# Patient Record
Sex: Male | Born: 1940
Health system: Southern US, Community
[De-identification: ages and names within clinical notes are randomized; demographics above are authoritative.]

## PROBLEM LIST (undated history)

## (undated) DIAGNOSIS — J302 Other seasonal allergic rhinitis: Secondary | ICD-10-CM

## (undated) DIAGNOSIS — J383 Other diseases of vocal cords: Secondary | ICD-10-CM

## (undated) DIAGNOSIS — F419 Anxiety disorder, unspecified: Secondary | ICD-10-CM

## (undated) DIAGNOSIS — Z860101 Personal history of adenomatous and serrated colon polyps: Secondary | ICD-10-CM

## (undated) DIAGNOSIS — R350 Frequency of micturition: Secondary | ICD-10-CM

## (undated) DIAGNOSIS — Z86007 Personal history of in-situ neoplasm of skin: Secondary | ICD-10-CM

## (undated) DIAGNOSIS — J3089 Other allergic rhinitis: Secondary | ICD-10-CM

## (undated) DIAGNOSIS — T8859XA Other complications of anesthesia, initial encounter: Secondary | ICD-10-CM

## (undated) DIAGNOSIS — R202 Paresthesia of skin: Secondary | ICD-10-CM

## (undated) DIAGNOSIS — N400 Enlarged prostate without lower urinary tract symptoms: Secondary | ICD-10-CM

## (undated) DIAGNOSIS — K219 Gastro-esophageal reflux disease without esophagitis: Secondary | ICD-10-CM

## (undated) DIAGNOSIS — K5730 Diverticulosis of large intestine without perforation or abscess without bleeding: Secondary | ICD-10-CM

## (undated) DIAGNOSIS — N210 Calculus in bladder: Secondary | ICD-10-CM

## (undated) DIAGNOSIS — Z9109 Other allergy status, other than to drugs and biological substances: Secondary | ICD-10-CM

## (undated) DIAGNOSIS — M1990 Unspecified osteoarthritis, unspecified site: Secondary | ICD-10-CM

## (undated) DIAGNOSIS — Z87442 Personal history of urinary calculi: Secondary | ICD-10-CM

## (undated) DIAGNOSIS — D696 Thrombocytopenia, unspecified: Secondary | ICD-10-CM

## (undated) DIAGNOSIS — H04123 Dry eye syndrome of bilateral lacrimal glands: Secondary | ICD-10-CM

## (undated) DIAGNOSIS — Z86010 Personal history of colonic polyps: Secondary | ICD-10-CM

## (undated) DIAGNOSIS — G43909 Migraine, unspecified, not intractable, without status migrainosus: Secondary | ICD-10-CM

## (undated) DIAGNOSIS — Z973 Presence of spectacles and contact lenses: Secondary | ICD-10-CM

## (undated) DIAGNOSIS — K5909 Other constipation: Secondary | ICD-10-CM

## (undated) DIAGNOSIS — T4145XA Adverse effect of unspecified anesthetic, initial encounter: Secondary | ICD-10-CM

## (undated) HISTORY — PX: APPENDECTOMY: SHX54

## (undated) HISTORY — PX: EXTRACORPOREAL SHOCK WAVE LITHOTRIPSY: SHX1557

## (undated) HISTORY — PX: NISSEN FUNDOPLICATION: SHX2091

## (undated) HISTORY — PX: CARDIOVASCULAR STRESS TEST: SHX262

## (undated) HISTORY — PX: INGUINAL HERNIA REPAIR: SUR1180

## (undated) HISTORY — PX: COLONOSCOPY: SHX174

## (undated) HISTORY — DX: Gastro-esophageal reflux disease without esophagitis: K21.9

## (undated) HISTORY — PX: TONSILLECTOMY: SUR1361

---

## 1995-03-20 ENCOUNTER — Encounter: Payer: Self-pay | Admitting: Internal Medicine

## 1999-03-24 ENCOUNTER — Encounter: Payer: Self-pay | Admitting: Family Medicine

## 1999-03-24 ENCOUNTER — Emergency Department (HOSPITAL_COMMUNITY): Admission: EM | Admit: 1999-03-24 | Discharge: 1999-03-24 | Payer: Self-pay | Admitting: Emergency Medicine

## 1999-03-24 ENCOUNTER — Encounter: Payer: Self-pay | Admitting: Urology

## 1999-03-28 ENCOUNTER — Ambulatory Visit (HOSPITAL_COMMUNITY): Admission: RE | Admit: 1999-03-28 | Discharge: 1999-03-28 | Payer: Self-pay | Admitting: Urology

## 1999-03-28 ENCOUNTER — Encounter: Payer: Self-pay | Admitting: Urology

## 2001-06-07 ENCOUNTER — Encounter: Payer: Self-pay | Admitting: Urology

## 2001-06-07 ENCOUNTER — Encounter: Admission: RE | Admit: 2001-06-07 | Discharge: 2001-06-07 | Payer: Self-pay | Admitting: Urology

## 2002-11-11 ENCOUNTER — Encounter (INDEPENDENT_AMBULATORY_CARE_PROVIDER_SITE_OTHER): Payer: Self-pay | Admitting: *Deleted

## 2002-11-11 ENCOUNTER — Ambulatory Visit (HOSPITAL_COMMUNITY): Admission: RE | Admit: 2002-11-11 | Discharge: 2002-11-11 | Payer: Self-pay | Admitting: Gastroenterology

## 2003-09-19 ENCOUNTER — Encounter: Admission: RE | Admit: 2003-09-19 | Discharge: 2003-09-19 | Payer: Self-pay | Admitting: Allergy and Immunology

## 2003-11-14 ENCOUNTER — Encounter: Payer: Self-pay | Admitting: Pulmonary Disease

## 2004-12-20 ENCOUNTER — Ambulatory Visit: Payer: Self-pay | Admitting: Pulmonary Disease

## 2005-01-17 ENCOUNTER — Ambulatory Visit: Payer: Self-pay | Admitting: Pulmonary Disease

## 2005-02-28 ENCOUNTER — Ambulatory Visit: Payer: Self-pay | Admitting: Pulmonary Disease

## 2005-05-13 ENCOUNTER — Ambulatory Visit: Payer: Self-pay | Admitting: Pulmonary Disease

## 2005-08-06 ENCOUNTER — Encounter: Admission: RE | Admit: 2005-08-06 | Discharge: 2005-08-06 | Payer: Self-pay | Admitting: Gastroenterology

## 2005-08-11 ENCOUNTER — Encounter: Admission: RE | Admit: 2005-08-11 | Discharge: 2005-08-11 | Payer: Self-pay | Admitting: Gastroenterology

## 2006-01-30 ENCOUNTER — Ambulatory Visit: Payer: Self-pay | Admitting: Pulmonary Disease

## 2006-03-13 ENCOUNTER — Ambulatory Visit: Payer: Self-pay | Admitting: Pulmonary Disease

## 2006-10-20 ENCOUNTER — Ambulatory Visit (HOSPITAL_COMMUNITY): Admission: RE | Admit: 2006-10-20 | Discharge: 2006-10-20 | Payer: Self-pay | Admitting: Orthopedic Surgery

## 2007-01-28 DIAGNOSIS — T7840XA Allergy, unspecified, initial encounter: Secondary | ICD-10-CM | POA: Insufficient documentation

## 2007-01-28 DIAGNOSIS — R05 Cough: Secondary | ICD-10-CM

## 2007-01-28 DIAGNOSIS — R059 Cough, unspecified: Secondary | ICD-10-CM | POA: Insufficient documentation

## 2007-01-28 DIAGNOSIS — R002 Palpitations: Secondary | ICD-10-CM | POA: Insufficient documentation

## 2007-01-28 DIAGNOSIS — M810 Age-related osteoporosis without current pathological fracture: Secondary | ICD-10-CM | POA: Insufficient documentation

## 2007-08-12 ENCOUNTER — Encounter: Payer: Self-pay | Admitting: Pulmonary Disease

## 2007-09-21 ENCOUNTER — Ambulatory Visit: Payer: Self-pay | Admitting: Pulmonary Disease

## 2007-09-21 ENCOUNTER — Telehealth (INDEPENDENT_AMBULATORY_CARE_PROVIDER_SITE_OTHER): Payer: Self-pay | Admitting: *Deleted

## 2007-09-21 DIAGNOSIS — K219 Gastro-esophageal reflux disease without esophagitis: Secondary | ICD-10-CM | POA: Insufficient documentation

## 2007-09-21 DIAGNOSIS — J309 Allergic rhinitis, unspecified: Secondary | ICD-10-CM | POA: Insufficient documentation

## 2007-09-28 ENCOUNTER — Ambulatory Visit: Payer: Self-pay | Admitting: Pulmonary Disease

## 2007-12-25 ENCOUNTER — Encounter: Admission: RE | Admit: 2007-12-25 | Discharge: 2007-12-25 | Payer: Self-pay | Admitting: Orthopedic Surgery

## 2008-04-04 ENCOUNTER — Ambulatory Visit: Payer: Self-pay | Admitting: Pulmonary Disease

## 2008-04-17 ENCOUNTER — Ambulatory Visit: Payer: Self-pay | Admitting: Pulmonary Disease

## 2008-06-29 ENCOUNTER — Emergency Department (HOSPITAL_COMMUNITY): Admission: EM | Admit: 2008-06-29 | Discharge: 2008-06-30 | Payer: Self-pay | Admitting: Emergency Medicine

## 2008-10-05 ENCOUNTER — Encounter: Admission: RE | Admit: 2008-10-05 | Discharge: 2008-10-05 | Payer: Self-pay | Admitting: Gastroenterology

## 2008-11-03 ENCOUNTER — Encounter: Admission: RE | Admit: 2008-11-03 | Discharge: 2008-11-03 | Payer: Self-pay | Admitting: Gastroenterology

## 2008-11-21 ENCOUNTER — Ambulatory Visit: Payer: Self-pay | Admitting: Internal Medicine

## 2008-11-21 ENCOUNTER — Telehealth (INDEPENDENT_AMBULATORY_CARE_PROVIDER_SITE_OTHER): Payer: Self-pay | Admitting: *Deleted

## 2009-01-18 ENCOUNTER — Ambulatory Visit (HOSPITAL_COMMUNITY): Admission: RE | Admit: 2009-01-18 | Discharge: 2009-01-18 | Payer: Self-pay | Admitting: Gastroenterology

## 2009-08-23 ENCOUNTER — Ambulatory Visit: Payer: Self-pay | Admitting: Pulmonary Disease

## 2010-03-28 ENCOUNTER — Emergency Department (HOSPITAL_COMMUNITY)
Admission: EM | Admit: 2010-03-28 | Discharge: 2010-03-28 | Payer: Self-pay | Source: Home / Self Care | Admitting: Emergency Medicine

## 2010-04-21 ENCOUNTER — Encounter: Payer: Self-pay | Admitting: Gastroenterology
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
## 2010-05-02 NOTE — Consult Note (Signed)
Summary: [facility] @ [facility] @ [place]   Imported By: [name] Links D'jimraou 09/16/2007 15:08:00  _____________________________________________________________________  External Attachment:    Type:   Image     Comment:   External Document
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
## 2010-05-02 NOTE — Progress Notes (Signed)
Summary: rx request/ sob   Phone Note Call from Patient   Caller: Patient Call For: alva Summary of Call: pt c/o sob. says he'd like rx called in and he'll make an appt w/ ra later. i tried to get him in to be seen but pt needs a late (as late as 4:30 appt). [facility] pt # [phone] Initial call taken by: Tivis Ringer,  November 21, 2008 11:10 AM  Follow-up for Phone Call        Spoke with pt.  He c/o increased sob and chest tightness x 2 days.  OV with TP today at 4:30 pm. Follow-up by: Vernie Murders,  November 21, 2008 11:22 AM

## 2010-05-02 NOTE — Procedures (Signed)
Summary: EGD w/biopsy  EGD w/biopsy   Imported By: Sherian Rein 01/10/2009 10:09:35  _____________________________________________________________________  External Attachment:    Type:   Image     Comment:   External Document

## 2010-05-07 ENCOUNTER — Other Ambulatory Visit: Payer: Self-pay | Admitting: Gastroenterology

## 2010-05-23 ENCOUNTER — Ambulatory Visit
Admission: RE | Admit: 2010-05-23 | Discharge: 2010-05-23 | Disposition: A | Discharge status: Home / Self Care | Payer: BC Managed Care – PPO | Source: Ambulatory Visit | Attending: Gastroenterology | Admitting: Gastroenterology

## 2010-07-10 LAB — BASIC METABOLIC PANEL
BUN: 18 mg/dL (ref 6–23)
CO2: 27 mEq/L (ref 19–32)
Calcium: 9.2 mg/dL (ref 8.4–10.5)
Chloride: 106 mEq/L (ref 96–112)
Creatinine, Ser: 0.76 mg/dL (ref 0.4–1.5)
GFR calc Af Amer: >60 mL/min (ref >60)
GFR calc non Af Amer: >60 mL/min (ref >60)
Glucose, Bld: 110 mg/dL — ABNORMAL HIGH (ref 70–99)
Potassium: 4.1 mEq/L (ref 3.5–5.1)
Sodium: 138 mEq/L (ref 135–145)

## 2010-07-10 LAB — DIFFERENTIAL
Basophils Absolute: 0 10*3/uL (ref 0.0–0.1)
Basophils Relative: 1 % (ref 0–1)
Eosinophils Absolute: 0.4 10*3/uL (ref 0.0–0.7)
Eosinophils Relative: 7 % — ABNORMAL HIGH (ref 0–5)
Lymphocytes Relative: 34 % (ref 12–46)
Lymphs Abs: 1.9 10*3/uL (ref 0.7–4.0)
Monocytes Absolute: 0.5 10*3/uL (ref 0.1–1.0)
Monocytes Relative: 9 % (ref 3–12)
Neutro Abs: 2.8 10*3/uL (ref 1.7–7.7)
Neutrophils Relative %: 50 % (ref 43–77)

## 2010-07-10 LAB — POCT CARDIAC MARKERS
CKMB, poc: <1 ng/mL — ABNORMAL LOW (ref 1.0–8.0)
Myoglobin, poc: 36 ng/mL (ref 12–200)
Troponin i, poc: <0.05 ng/mL (ref 0.00–0.09)

## 2010-07-10 LAB — CBC
HCT: 40.5 % (ref 39.0–52.0)
Hemoglobin: 14.2 g/dL (ref 13.0–17.0)
MCHC: 35 g/dL (ref 30.0–36.0)
MCV: 95.5 fL (ref 78.0–100.0)
Platelets: 123 10*3/uL — ABNORMAL LOW (ref 150–400)
RBC: 4.24 MIL/uL (ref 4.22–5.81)
RDW: 12.6 % (ref 11.5–15.5)
WBC: 5.6 10*3/uL (ref 4.0–10.5)

## 2010-07-17 ENCOUNTER — Other Ambulatory Visit: Payer: Self-pay | Admitting: Orthopedic Surgery

## 2010-07-17 DIAGNOSIS — R531 Weakness: Secondary | ICD-10-CM

## 2010-07-17 DIAGNOSIS — R52 Pain, unspecified: Secondary | ICD-10-CM

## 2010-07-20 ENCOUNTER — Ambulatory Visit (HOSPITAL_COMMUNITY)
Admission: RE | Admit: 2010-07-20 | Discharge: 2010-07-20 | Disposition: A | Discharge status: Home / Self Care | Payer: BC Managed Care – PPO | Source: Ambulatory Visit | Attending: Orthopedic Surgery | Admitting: Orthopedic Surgery

## 2010-07-20 DIAGNOSIS — M5020 Other cervical disc displacement, unspecified cervical region: Secondary | ICD-10-CM | POA: Insufficient documentation

## 2010-07-20 DIAGNOSIS — R5381 Other malaise: Secondary | ICD-10-CM | POA: Insufficient documentation

## 2010-07-20 DIAGNOSIS — R531 Weakness: Secondary | ICD-10-CM

## 2010-07-20 DIAGNOSIS — M4120 Other idiopathic scoliosis, site unspecified: Secondary | ICD-10-CM | POA: Insufficient documentation

## 2010-07-20 DIAGNOSIS — R52 Pain, unspecified: Secondary | ICD-10-CM

## 2010-07-20 DIAGNOSIS — R209 Unspecified disturbances of skin sensation: Secondary | ICD-10-CM | POA: Insufficient documentation

## 2010-09-05 ENCOUNTER — Ambulatory Visit (HOSPITAL_COMMUNITY)
Admission: RE | Admit: 2010-09-05 | Discharge: 2010-09-05 | Disposition: A | Discharge status: Home / Self Care | Payer: BC Managed Care – PPO | Source: Ambulatory Visit | Attending: Gastroenterology | Admitting: Gastroenterology

## 2010-09-05 ENCOUNTER — Other Ambulatory Visit: Payer: Self-pay | Admitting: Gastroenterology

## 2010-09-05 DIAGNOSIS — M810 Age-related osteoporosis without current pathological fracture: Secondary | ICD-10-CM | POA: Insufficient documentation

## 2010-09-05 DIAGNOSIS — K2980 Duodenitis without bleeding: Secondary | ICD-10-CM | POA: Insufficient documentation

## 2010-09-05 DIAGNOSIS — Z1211 Encounter for screening for malignant neoplasm of colon: Secondary | ICD-10-CM | POA: Insufficient documentation

## 2010-09-05 DIAGNOSIS — R1310 Dysphagia, unspecified: Secondary | ICD-10-CM | POA: Insufficient documentation

## 2010-09-05 DIAGNOSIS — H5040 Unspecified heterotropia: Secondary | ICD-10-CM | POA: Insufficient documentation

## 2010-09-05 DIAGNOSIS — Z860100 Personal history of colon polyps, unspecified: Secondary | ICD-10-CM | POA: Insufficient documentation

## 2010-09-05 DIAGNOSIS — K219 Gastro-esophageal reflux disease without esophagitis: Secondary | ICD-10-CM | POA: Insufficient documentation

## 2010-09-05 DIAGNOSIS — I059 Rheumatic mitral valve disease, unspecified: Secondary | ICD-10-CM | POA: Insufficient documentation

## 2010-09-13 ENCOUNTER — Encounter: Payer: Self-pay | Admitting: Adult Health

## 2010-09-16 NOTE — Op Note (Signed)
  NAME:  Lee, Nelson NO.:  [id number]  MEDICAL RECORD NO.:  [id number]  LOCATION:  [facility]                         FACILITY:  [facility]  PHYSICIAN:  Danise Edge, M.D.   DATE OF BIRTH:  25-Feb-1941  DATE OF PROCEDURE:  09/05/2010 DATE OF DISCHARGE:                              OPERATIVE REPORT   PROCEDURES:  Esophagogastroduodenoscopy, small-bowel biopsy and colonoscopy.  HISTORY:  [name] is a 70 year old male born 06-10-1940.  [name] is scheduled to undergo a surveillance colonoscopy with polypectomy to prevent colon cancer.  In 2004, the patient underwent a colonoscopy with removal of a small adenomatous polyp.  In 2007, the patient's surveillance colonoscopy was normal.  The patient has undergone a Nissen fundoplication to control gastroesophageal reflux.  In 2005, the patient's esophagogastroduodenoscopy was normal post Nissen fundoplication.  In 2010, the patient's esophagogastroduodenoscopy was normal post Nissen fundoplication.  In February 2012, the patient's barium esophagram with barium tablet was normal.  ENDOSCOPIST:  Danise Edge, M.D.  PREMEDICATIONS:  Fentanyl 100 mcg, Versed 9.5 mg.  PROCEDURE:  Esophagogastroduodenoscopy:  The Pentax gastroscope was passed through the posterior hypopharynx into the proximal esophagus without difficulty.  The hypopharynx, larynx and vocal cords appeared normal.  Esophagoscopy:  The proximal mid and lower segments of the esophageal mucosa appeared normal.  The squamocolumnar junction appears regular at 40 cm from the incisor teeth.  There is no endoscopic evidence for the presence of erosive esophagitis, Barrett's esophagus or esophageal stricture formation.  Gastroscopy:  Retroflex view of the gastric cardia and fundus was normal post Nissen fundoplication which appears to be intact.  The gastric body, antrum and pylorus appeared normal.  Duodenoscopy:  The duodenal bulb and  descending duodenum appeared normal.  BIOPSIES:  Biopsies were taken from the small intestine to rule out celiac disease.  ASSESSMENT:  Normal esophagogastroduodenoscopy post Nissen fundoplication.  Small bowel biopsies to look for villous atrophy pending.  PROCEDURE:  Surveillance colonoscopy:  Anal inspection and digital rectal exam were normal.  The Pentax pediatric colonoscope was introduced into the rectum and advanced to the cecum.  A normal- appearing ileocecal valve and appendiceal orifice were identified. Colonic preparation for the exam today was good.  Advancement of the colonoscope was technically difficult due to significant colonic loop formation.  Rectum normal.  Retroflex view of the distal rectum normal.  Sigmoid colon and descending colon normal.  Splenic flexure normal.  Transverse colon normal.  Hepatic flexure normal.  Ascending colon normal.  Cecum and ileocecal valve normal.  ASSESSMENT:  Normal surveillance proctocolonoscopy to the cecum.  RECOMMENDATIONS:  Repeat surveillance colonoscopy in 5 years.          ______________________________ Danise Edge, M.D.     MJ/MEDQ  D:  09/05/2010  T:  09/05/2010  Job:  [id number]  cc:   Gloriajean Dell. Andrey Campanile, M.D. Fax: [phone]  Electronically Signed by Danise Edge M.D. on 09/16/2010 04:15:38 PM

## 2010-09-17 ENCOUNTER — Other Ambulatory Visit (HOSPITAL_COMMUNITY): Payer: Self-pay | Admitting: Orthopedic Surgery

## 2010-09-17 DIAGNOSIS — R531 Weakness: Secondary | ICD-10-CM

## 2010-09-18 ENCOUNTER — Ambulatory Visit: Payer: BC Managed Care – PPO | Admitting: Adult Health

## 2010-09-24 ENCOUNTER — Ambulatory Visit (HOSPITAL_COMMUNITY)
Admission: RE | Admit: 2010-09-24 | Discharge: 2010-09-24 | Disposition: A | Discharge status: Home / Self Care | Payer: BC Managed Care – PPO | Source: Ambulatory Visit | Attending: Orthopedic Surgery | Admitting: Orthopedic Surgery

## 2010-09-24 DIAGNOSIS — M25579 Pain in unspecified ankle and joints of unspecified foot: Secondary | ICD-10-CM | POA: Insufficient documentation

## 2010-09-24 DIAGNOSIS — R531 Weakness: Secondary | ICD-10-CM

## 2010-10-03 ENCOUNTER — Ambulatory Visit: Payer: BC Managed Care – PPO | Admitting: Pulmonary Disease

## 2010-11-01 ENCOUNTER — Other Ambulatory Visit (HOSPITAL_COMMUNITY): Payer: Self-pay | Admitting: Orthopedic Surgery

## 2010-11-01 DIAGNOSIS — R52 Pain, unspecified: Secondary | ICD-10-CM

## 2010-11-01 DIAGNOSIS — R531 Weakness: Secondary | ICD-10-CM

## 2010-11-02 ENCOUNTER — Ambulatory Visit (HOSPITAL_COMMUNITY)
Admission: RE | Admit: 2010-11-02 | Discharge: 2010-11-02 | Disposition: A | Discharge status: Home / Self Care | Payer: BC Managed Care – PPO | Source: Ambulatory Visit | Attending: Orthopedic Surgery | Admitting: Orthopedic Surgery

## 2010-11-02 DIAGNOSIS — M25559 Pain in unspecified hip: Secondary | ICD-10-CM | POA: Insufficient documentation

## 2010-11-02 DIAGNOSIS — R52 Pain, unspecified: Secondary | ICD-10-CM

## 2010-11-02 DIAGNOSIS — M545 Low back pain, unspecified: Secondary | ICD-10-CM | POA: Insufficient documentation

## 2010-11-02 DIAGNOSIS — R531 Weakness: Secondary | ICD-10-CM

## 2010-11-02 DIAGNOSIS — M47817 Spondylosis without myelopathy or radiculopathy, lumbosacral region: Secondary | ICD-10-CM | POA: Insufficient documentation

## 2010-11-02 DIAGNOSIS — M4120 Other idiopathic scoliosis, site unspecified: Secondary | ICD-10-CM | POA: Insufficient documentation

## 2010-11-02 DIAGNOSIS — IMO0001 Reserved for inherently not codable concepts without codable children: Secondary | ICD-10-CM | POA: Insufficient documentation

## 2010-11-02 DIAGNOSIS — Z9181 History of falling: Secondary | ICD-10-CM | POA: Insufficient documentation

## 2010-11-02 DIAGNOSIS — R5381 Other malaise: Secondary | ICD-10-CM | POA: Insufficient documentation

## 2010-11-02 DIAGNOSIS — M8560 Other cyst of bone, unspecified site: Secondary | ICD-10-CM | POA: Insufficient documentation

## 2010-11-15 ENCOUNTER — Other Ambulatory Visit (HOSPITAL_COMMUNITY): Payer: Self-pay | Admitting: Diagnostic Neuroimaging

## 2010-11-15 ENCOUNTER — Other Ambulatory Visit (HOSPITAL_COMMUNITY): Payer: Self-pay | Admitting: Radiology

## 2010-11-15 DIAGNOSIS — G459 Transient cerebral ischemic attack, unspecified: Secondary | ICD-10-CM

## 2010-11-18 ENCOUNTER — Ambulatory Visit (HOSPITAL_COMMUNITY): Payer: BC Managed Care – PPO | Attending: Diagnostic Neuroimaging | Admitting: Radiology

## 2010-11-18 ENCOUNTER — Other Ambulatory Visit (HOSPITAL_COMMUNITY): Payer: BC Managed Care – PPO | Admitting: Radiology

## 2010-11-18 DIAGNOSIS — M6281 Muscle weakness (generalized): Secondary | ICD-10-CM | POA: Insufficient documentation

## 2010-11-18 DIAGNOSIS — I079 Rheumatic tricuspid valve disease, unspecified: Secondary | ICD-10-CM | POA: Insufficient documentation

## 2010-11-18 DIAGNOSIS — I059 Rheumatic mitral valve disease, unspecified: Secondary | ICD-10-CM | POA: Insufficient documentation

## 2010-11-18 DIAGNOSIS — G459 Transient cerebral ischemic attack, unspecified: Secondary | ICD-10-CM

## 2010-11-18 HISTORY — PX: TRANSTHORACIC ECHOCARDIOGRAM: SHX275

## 2010-11-19 ENCOUNTER — Encounter (HOSPITAL_COMMUNITY): Payer: Self-pay | Admitting: Diagnostic Neuroimaging

## 2011-01-09 ENCOUNTER — Other Ambulatory Visit: Payer: Self-pay | Admitting: Podiatrist

## 2011-01-09 DIAGNOSIS — M79671 Pain in right foot: Secondary | ICD-10-CM

## 2011-01-09 DIAGNOSIS — M79672 Pain in left foot: Secondary | ICD-10-CM

## 2011-01-18 ENCOUNTER — Ambulatory Visit (HOSPITAL_COMMUNITY)
Admission: RE | Admit: 2011-01-18 | Discharge: 2011-01-18 | Disposition: A | Discharge status: Home / Self Care | Payer: BC Managed Care – PPO | Source: Ambulatory Visit | Attending: Podiatrist | Admitting: Podiatrist

## 2011-01-18 DIAGNOSIS — M79671 Pain in right foot: Secondary | ICD-10-CM

## 2011-01-18 DIAGNOSIS — M79672 Pain in left foot: Secondary | ICD-10-CM

## 2011-01-18 DIAGNOSIS — M79609 Pain in unspecified limb: Secondary | ICD-10-CM | POA: Insufficient documentation

## 2011-02-03 ENCOUNTER — Other Ambulatory Visit (HOSPITAL_COMMUNITY): Payer: Self-pay | Admitting: Orthopedic Surgery

## 2011-02-03 DIAGNOSIS — M25561 Pain in right knee: Secondary | ICD-10-CM

## 2011-02-03 DIAGNOSIS — IMO0002 Reserved for concepts with insufficient information to code with codable children: Secondary | ICD-10-CM

## 2011-02-06 ENCOUNTER — Other Ambulatory Visit (HOSPITAL_COMMUNITY): Payer: BC Managed Care – PPO

## 2011-02-08 ENCOUNTER — Ambulatory Visit (HOSPITAL_COMMUNITY)
Admission: RE | Admit: 2011-02-08 | Discharge: 2011-02-08 | Disposition: A | Discharge status: Home / Self Care | Payer: BC Managed Care – PPO | Source: Ambulatory Visit | Attending: Orthopedic Surgery | Admitting: Orthopedic Surgery

## 2011-02-08 DIAGNOSIS — M23319 Other meniscus derangements, anterior horn of medial meniscus, unspecified knee: Secondary | ICD-10-CM | POA: Insufficient documentation

## 2011-02-08 DIAGNOSIS — M25561 Pain in right knee: Secondary | ICD-10-CM

## 2011-02-08 DIAGNOSIS — IMO0002 Reserved for concepts with insufficient information to code with codable children: Secondary | ICD-10-CM

## 2011-02-08 DIAGNOSIS — M25569 Pain in unspecified knee: Secondary | ICD-10-CM | POA: Insufficient documentation

## 2011-02-08 DIAGNOSIS — M6740 Ganglion, unspecified site: Secondary | ICD-10-CM | POA: Insufficient documentation

## 2011-02-28 ENCOUNTER — Other Ambulatory Visit: Payer: Self-pay | Admitting: Dermatology

## 2011-12-09 ENCOUNTER — Other Ambulatory Visit: Payer: Self-pay | Admitting: Gastroenterology

## 2011-12-09 DIAGNOSIS — R1310 Dysphagia, unspecified: Secondary | ICD-10-CM

## 2011-12-24 ENCOUNTER — Ambulatory Visit
Admission: RE | Admit: 2011-12-24 | Discharge: 2011-12-24 | Disposition: A | Discharge status: Home / Self Care | Payer: BC Managed Care – PPO | Source: Ambulatory Visit | Attending: Gastroenterology | Admitting: Gastroenterology

## 2011-12-24 DIAGNOSIS — R1310 Dysphagia, unspecified: Secondary | ICD-10-CM

## 2012-03-08 ENCOUNTER — Other Ambulatory Visit: Payer: Self-pay | Admitting: Internal Medicine

## 2012-03-08 DIAGNOSIS — R1011 Right upper quadrant pain: Secondary | ICD-10-CM

## 2012-03-12 ENCOUNTER — Ambulatory Visit
Admission: RE | Admit: 2012-03-12 | Discharge: 2012-03-12 | Disposition: A | Discharge status: Home / Self Care | Payer: BC Managed Care – PPO | Source: Ambulatory Visit | Attending: Internal Medicine | Admitting: Internal Medicine

## 2012-03-12 DIAGNOSIS — R1011 Right upper quadrant pain: Secondary | ICD-10-CM

## 2012-07-03 ENCOUNTER — Encounter (HOSPITAL_COMMUNITY): Payer: Self-pay | Admitting: Emergency Medicine

## 2012-07-03 ENCOUNTER — Emergency Department (HOSPITAL_COMMUNITY)
Admission: EM | Admit: 2012-07-03 | Discharge: 2012-07-03 | Disposition: A | Discharge status: Home / Self Care | Payer: BC Managed Care – PPO | Attending: Emergency Medicine | Admitting: Emergency Medicine

## 2012-07-03 DIAGNOSIS — Z79899 Other long term (current) drug therapy: Secondary | ICD-10-CM | POA: Insufficient documentation

## 2012-07-03 DIAGNOSIS — S134XXA Sprain of ligaments of cervical spine, initial encounter: Secondary | ICD-10-CM

## 2012-07-03 DIAGNOSIS — S139XXA Sprain of joints and ligaments of unspecified parts of neck, initial encounter: Secondary | ICD-10-CM | POA: Insufficient documentation

## 2012-07-03 DIAGNOSIS — Z85828 Personal history of other malignant neoplasm of skin: Secondary | ICD-10-CM | POA: Insufficient documentation

## 2012-07-03 DIAGNOSIS — Y92410 Unspecified street and highway as the place of occurrence of the external cause: Secondary | ICD-10-CM | POA: Insufficient documentation

## 2012-07-03 DIAGNOSIS — Z8709 Personal history of other diseases of the respiratory system: Secondary | ICD-10-CM | POA: Insufficient documentation

## 2012-07-03 DIAGNOSIS — K219 Gastro-esophageal reflux disease without esophagitis: Secondary | ICD-10-CM | POA: Insufficient documentation

## 2012-07-03 DIAGNOSIS — R51 Headache: Secondary | ICD-10-CM

## 2012-07-03 DIAGNOSIS — Y9389 Activity, other specified: Secondary | ICD-10-CM | POA: Insufficient documentation
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
## 2012-10-26 ENCOUNTER — Telehealth: Payer: Self-pay | Admitting: Physical Medicine & Rehabilitation

## 2013-01-07 ENCOUNTER — Other Ambulatory Visit (HOSPITAL_COMMUNITY): Payer: Self-pay | Admitting: Orthopedic Surgery

## 2013-01-07 DIAGNOSIS — M549 Dorsalgia, unspecified: Secondary | ICD-10-CM

## 2013-01-13 ENCOUNTER — Ambulatory Visit (HOSPITAL_COMMUNITY): Payer: BC Managed Care – PPO

## 2013-01-13 ENCOUNTER — Ambulatory Visit (HOSPITAL_COMMUNITY)
Admission: RE | Admit: 2013-01-13 | Discharge: 2013-01-13 | Disposition: A | Discharge status: Home / Self Care | Payer: BC Managed Care – PPO | Source: Ambulatory Visit | Attending: Orthopedic Surgery | Admitting: Orthopedic Surgery

## 2013-01-13 DIAGNOSIS — M545 Low back pain, unspecified: Secondary | ICD-10-CM | POA: Insufficient documentation

## 2013-01-13 DIAGNOSIS — M549 Dorsalgia, unspecified: Secondary | ICD-10-CM

## 2013-01-13 DIAGNOSIS — IMO0002 Reserved for concepts with insufficient information to code with codable children: Secondary | ICD-10-CM | POA: Insufficient documentation

## 2013-01-13 DIAGNOSIS — M51379 Other intervertebral disc degeneration, lumbosacral region without mention of lumbar back pain or lower extremity pain: Secondary | ICD-10-CM | POA: Insufficient documentation

## 2013-01-13 DIAGNOSIS — M5137 Other intervertebral disc degeneration, lumbosacral region: Secondary | ICD-10-CM | POA: Insufficient documentation

## 2013-01-13 DIAGNOSIS — M546 Pain in thoracic spine: Secondary | ICD-10-CM | POA: Insufficient documentation

## 2013-09-07 ENCOUNTER — Encounter: Payer: Self-pay | Admitting: Diagnostic Neuroimaging

## 2013-09-07 ENCOUNTER — Ambulatory Visit (INDEPENDENT_AMBULATORY_CARE_PROVIDER_SITE_OTHER): Payer: BC Managed Care – PPO | Admitting: Diagnostic Neuroimaging

## 2013-09-07 ENCOUNTER — Encounter (INDEPENDENT_AMBULATORY_CARE_PROVIDER_SITE_OTHER): Payer: Self-pay

## 2013-09-07 VITALS — BP 90/55 | HR 69 | Temp 97.7°F | Ht 70.0 in | Wt 136.0 lb

## 2013-09-07 DIAGNOSIS — H532 Diplopia: Secondary | ICD-10-CM

## 2013-09-07 DIAGNOSIS — R51 Headache: Secondary | ICD-10-CM

## 2013-09-07 DIAGNOSIS — M542 Cervicalgia: Secondary | ICD-10-CM

## 2013-09-07 NOTE — Patient Instructions (Signed)
Start aspirin 81 mg daily

## 2013-09-07 NOTE — Progress Notes (Signed)
[name]  PATIENT: Lee Nelson DOB: Aug 31, 1940  REFERRING CLINICIAN: Avva HISTORY FROM: patient  REASON FOR VISIT: new consult   HISTORICAL  CHIEF COMPLAINT:  Chief Complaint  Patient presents with  . Diplopia    HA    HISTORY OF PRESENT ILLNESS:   73 year old right-handed male with history of neck pain, back pain, skin cancer, reflux disease, osteoporosis, anxiety, fibromyalgia, migraine, here for evaluation of headaches and transient double vision. Previous evaluated patient in 2012 for right arm numbness.  Patient reports over ten-year history of intermittent neck pain. Patient has some degenerative cervical spine disease managed conservatively.  Patient has one to 2 year history of intermittent headaches mainly in the right parietal and right occipital region. Some pain radiates from the neck up to the head. No nausea or vomiting. No photophobia or phonophobia. Sometimes he has sharp shooting brief pains. He rates pain severity 2-3/10. More recently headache symptoms have been more constant. No visual symptoms associated with headache. Separately patient has intermittent visual disturbance where he sees "swimming lines" in front of him, but these are not associated with headache.  10 days ago patient was at a meeting talking to some people, when he had sudden onset of vertical double vision lasting for one to 2 minutes. He had mild queasiness sensation. No vomiting. He had mild equilibrium problem without falling out of his chair. No slurred speech. No numbness or tingling. Symptoms resolved on their own. No headaches associated with this.  REVIEW OF SYSTEMS: Full 14 system review of systems performed and notable only for mild memory loss insomnia sleepiness headache numbness anxiety, and asleep feeling cold double vision trouble swallowing.  ALLERGIES: Allergies  Allergen Reactions  . Metoclopramide Hcl     REACTION: "involuntary movements"  .  Metronidazole     REACTION: no appetite, diarrhea after meal, decrease in weight  . Sulfamethoxazole-Trimethoprim     REACTION: "involuntary movements"    HOME MEDICATIONS: Outpatient Prescriptions Prior to Visit  Medication Sig Dispense Refill  . ALPRAZolam (XANAX) 0.25 MG tablet 1/4 tab by mouth at bedtime       . Calcium Carbonate-Vitamin D (CALCARB 600/D) 600-400 MG-UNIT per tablet Take 2 tablets by mouth 2 (two) times daily.        . Ginkgo Biloba Extract (GNP GINGKO BILOBA EXTRACT) 60 MG CAPS Take 1 capsule by mouth daily.      . lansoprazole (PREVACID) 15 MG capsule Take 15 mg by mouth daily.      Marland Kitchen loratadine (CLARITIN) 10 MG tablet Take 10 mg by mouth daily.      . Maca Root (MACA PO) Take 2.5 mLs by mouth daily. Maca Root. (Powder form)      . Magnesium 100 MG CAPS Take 1 capsule by mouth daily.      . Multiple Vitamin (MULTIVITAMIN) capsule Take 1 capsule by mouth daily.        . Nutritional Supplements (DHEA PO) as directed.        . Omega-3 Fatty Acids (FISH OIL PO) Take 1 capsule by mouth daily.      Marland Kitchen PHOSPHATIDYLSERINE PO Take 1-3 capsules by mouth daily.      . Saw Palmetto 160 MG CAPS Take 1 capsule by mouth daily.        . zaleplon (SONATA) 10 MG capsule Take 10 mg by mouth at bedtime.         No facility-administered medications prior to visit.    PAST MEDICAL HISTORY:  Past Medical History  Diagnosis Date  . Allergic rhinitis   . GERD (gastroesophageal reflux disease)   . Skin cancer     PAST SURGICAL HISTORY: History reviewed. No pertinent past surgical history.  FAMILY HISTORY: Family History  Problem Relation Age of Onset  . Parkinsonism Brother   . COPD Mother   . Allergies Mother   . Heart failure Mother   . COPD Father   . Stroke Father   . Cancer Maternal Grandfather   . Other Sister   . Suicidality Maternal Aunt     SOCIAL HISTORY:  History   Social History  . Marital Status: Married    Spouse Name: Neoma Laming    Number of Children: 1   . Years of Education: BA, [place], [place]   Occupational History  . teaches constitutional law   . PROFESSOR    Social History Main Topics  . Smoking status: Never Smoker   . Smokeless tobacco: Never Used  . Alcohol Use: Yes     Comment: social  . Drug Use: No  . Sexual Activity: Not on file   Other Topics Concern  . Not on file   Social History Narrative   Patient lives at home wife.   Daily caffeine use     PHYSICAL EXAM  Filed Vitals:   09/07/13 0925  BP: 90/55  Pulse: 69  Temp: 97.7 F (36.5 C)  TempSrc: Oral  Height: 5' 10"  (1.778 m)  Weight: 136 lb (61.689 kg)    Not recorded    Body mass index is 19.51 kg/(m^2).  GENERAL EXAM: Patient is in no distress; well developed, nourished and groomed; neck is supple  CARDIOVASCULAR: Regular rate and rhythm, no murmurs, no carotid bruits  NEUROLOGIC: MENTAL STATUS: awake, alert, oriented to person, place and time, recent and remote memory intact, normal attention and concentration, language fluent, comprehension intact, naming intact, fund of knowledge appropriate CRANIAL NERVE: no papilledema on fundoscopic exam, pupils equal and reactive to light, visual fields full to confrontation, extraocular muscles intact, no nystagmus, facial sensation and strength symmetric, hearing intact, palate elevates symmetrically, uvula midline, shoulder shrug symmetric, tongue midline. MOTOR: normal bulk and tone, full strength in the BUE, BLE SENSORY: normal and symmetric to light touch, pinprick, temperature, vibration  COORDINATION: MILD ACTION TREMOR WITH FINGER NOSE FINGER REFLEXES: deep tendon reflexes BRISK and symmetric GAIT/STATION: narrow based gait; romberg is negative    DIAGNOSTIC DATA (LABS, IMAGING, TESTING) - I reviewed patient records, labs, notes, testing and imaging myself where available.  Lab Results  Component Value Date   WBC 5.6 06/29/2008   HGB 14.2 06/29/2008   HCT 40.5 06/29/2008   MCV 95.5 06/29/2008   PLT  123* 06/29/2008      Component Value Date/Time   NA 138 06/29/2008 2305   K 4.1 06/29/2008 2305   CL 106 06/29/2008 2305   CO2 27 06/29/2008 2305   GLUCOSE 110* 06/29/2008 2305   BUN 18 06/29/2008 2305   CREATININE 0.76 06/29/2008 2305   CALCIUM 9.2 06/29/2008 2305   GFRNONAA >60 06/29/2008 2305   GFRAA  Value: >60        The eGFR has been calculated using the MDRD equation. This calculation has not been validated in all clinical situations. eGFR's persistently <60 mL/min signify possible Chronic Kidney Disease. 06/29/2008 2305   No results found for this basename: CHOL, HDL, LDLCALC, LDLDIRECT, TRIG, CHOLHDL   No results found for this basename: HGBA1C   No results found for  this basename: VITAMINB12   No results found for this basename: TSH    07/20/10 MRI CERVICAL 1. Left C6-C7 paracentral disc protrusion abutting the exiting left C7 nerve roots appears new. Preexisting mild left C7 foraminal stenosis at this level due to facet hypertrophy.  2. Mild combined congenital and acquired spinal stenosis at the C3-C4 and C4-C5.  11/20/10 MRI brain (without contrast) demonstrating: 1. Moderate ventriculomegaly in the temporal and occipital horns.  Likely due to subcortical atrophy. 2. No acute findings are seen.  11/20/10 MRA head (without contrast) demonstrating: - mild atheromatous irregularities in the bilateral carotid siphon regions.  11/19/10 EMG/NCS - normal  11/19/10 carotid u/s - normal  11/18/10 TTE - EF 55-60%; normal wall motion     ASSESSMENT AND PLAN  73 y.o. year old male here with chronic neck pain x 10 years, int HA x 1-2 years, with a 2 minute episode of vertical double vision 10 days ago. No unifying diagnosis. Migraine phenomenon is a possibility. TIA, neuromuscular disease, stress rxn are in differential dx.   PLAN: - MRI brain - start aspirin 21m daily - monitor symptoms; if recurrence, then come back to office sooner, otherwise 6 month follow up  Orders Placed This  Encounter  Procedures  . MR Brain W Wo Contrast   Return in about 6 months (around 03/09/2014).    VPenni Bombard MD 60/88/1103 115:94AM Certified in Neurology, Neurophysiology and Neuroimaging  [facility] [address] [place] [phone]

## 2013-09-16 ENCOUNTER — Ambulatory Visit
Admission: RE | Admit: 2013-09-16 | Discharge: 2013-09-16 | Disposition: A | Discharge status: Home / Self Care | Payer: BC Managed Care – PPO | Source: Ambulatory Visit | Attending: Diagnostic Neuroimaging | Admitting: Diagnostic Neuroimaging

## 2013-09-16 DIAGNOSIS — R51 Headache: Secondary | ICD-10-CM

## 2013-09-16 DIAGNOSIS — H532 Diplopia: Secondary | ICD-10-CM

## 2013-09-16 DIAGNOSIS — M542 Cervicalgia: Secondary | ICD-10-CM

## 2013-09-16 MED ORDER — GADOBENATE DIMEGLUMINE 529 MG/ML IV SOLN
12.0000 mL | Freq: Once | INTRAVENOUS | Status: AC | PRN
  Administered 2013-09-16: 12 mL via INTRAVENOUS

## 2013-10-11 ENCOUNTER — Telehealth: Payer: Self-pay | Admitting: Diagnostic Neuroimaging

## 2013-10-11 NOTE — Telephone Encounter (Signed)
Called pt and pt stated that he has not heard anything about his MRI results. Please advise

## 2013-10-11 NOTE — Telephone Encounter (Signed)
Call patient with MRI results. No bleeding, stroke or tumor. No major findings related to headaches and double vision. Continue current plan. -VRP

## 2013-10-11 NOTE — Telephone Encounter (Signed)
Patient calling to schedule an appointment soon with Dr. Marjory LiesPenumalli as a follow up after his MRI. Please return call to patient and advise (he did not want to take first available in October).

## 2013-10-12 NOTE — Telephone Encounter (Signed)
Called pt to inform him of his MRI results per Dr. Marjory LiesPenumalli that the pt's results showed no bleeding, stroke or tumor. No major findings related to headaches and double vision and to continue with current plan. I advised the pt the if he has any other problems, questions or concerns to call the office. Pt verbalized understanding.

## 2013-11-03 NOTE — Telephone Encounter (Signed)
Noted  

## 2013-11-28 ENCOUNTER — Other Ambulatory Visit (HOSPITAL_COMMUNITY): Payer: Self-pay | Admitting: Orthopedic Surgery

## 2013-11-28 DIAGNOSIS — M25571 Pain in right ankle and joints of right foot: Secondary | ICD-10-CM

## 2013-12-01 ENCOUNTER — Encounter (HOSPITAL_COMMUNITY): Payer: Self-pay | Admitting: Pharmacist

## 2013-12-08 ENCOUNTER — Other Ambulatory Visit: Payer: Self-pay | Admitting: Gastroenterology

## 2013-12-08 ENCOUNTER — Encounter (HOSPITAL_COMMUNITY): Payer: Self-pay | Admitting: *Deleted

## 2013-12-08 NOTE — Addendum Note (Signed)
Addended by: Willis Modena on: 12/08/2013 03:30 PM   Modules accepted: Orders

## 2013-12-09 ENCOUNTER — Encounter (HOSPITAL_COMMUNITY): Payer: BC Managed Care – PPO | Admitting: Anesthesiology

## 2013-12-09 ENCOUNTER — Ambulatory Visit (HOSPITAL_COMMUNITY): Payer: BC Managed Care – PPO | Admitting: Anesthesiology

## 2013-12-09 ENCOUNTER — Encounter (HOSPITAL_COMMUNITY): Payer: Self-pay | Admitting: Gastroenterology

## 2013-12-09 ENCOUNTER — Ambulatory Visit (HOSPITAL_COMMUNITY)
Admission: RE | Admit: 2013-12-09 | Discharge: 2013-12-09 | Disposition: A | Discharge status: Home / Self Care | Payer: BC Managed Care – PPO | Source: Ambulatory Visit | Attending: Gastroenterology | Admitting: Gastroenterology

## 2013-12-09 ENCOUNTER — Encounter (HOSPITAL_COMMUNITY)
Admission: RE | Disposition: A | Discharge status: Home / Self Care | Payer: Self-pay | Source: Ambulatory Visit | Attending: Gastroenterology

## 2013-12-09 DIAGNOSIS — R1310 Dysphagia, unspecified: Secondary | ICD-10-CM | POA: Insufficient documentation

## 2013-12-09 DIAGNOSIS — K219 Gastro-esophageal reflux disease without esophagitis: Secondary | ICD-10-CM | POA: Insufficient documentation

## 2013-12-09 DIAGNOSIS — N289 Disorder of kidney and ureter, unspecified: Secondary | ICD-10-CM | POA: Diagnosis not present

## 2013-12-09 HISTORY — DX: Thrombocytopenia, unspecified: D69.6

## 2013-12-09 HISTORY — PX: ESOPHAGOGASTRODUODENOSCOPY: SHX5428

## 2013-12-09 HISTORY — DX: Other complications of anesthesia, initial encounter: T88.59XA

## 2013-12-09 HISTORY — DX: Adverse effect of unspecified anesthetic, initial encounter: T41.45XA

## 2013-12-09 SURGERY — EGD (ESOPHAGOGASTRODUODENOSCOPY)
Anesthesia: Monitor Anesthesia Care

## 2013-12-09 MED ORDER — PROPOFOL 10 MG/ML IV BOLUS
INTRAVENOUS | Status: DC | PRN
  Administered 2013-12-09 (×3): 20 mg via INTRAVENOUS

## 2013-12-09 MED ORDER — PROPOFOL INFUSION 10 MG/ML OPTIME
INTRAVENOUS | Status: DC | PRN
  Administered 2013-12-09: 50 ug/kg/min via INTRAVENOUS

## 2013-12-09 MED ORDER — LACTATED RINGERS IV SOLN
INTRAVENOUS | Status: DC | PRN
  Administered 2013-12-09: 12:00:00 via INTRAVENOUS

## 2013-12-09 MED ORDER — SODIUM CHLORIDE 0.9 % IV SOLN: INTRAVENOUS | Status: DC

## 2013-12-09 MED ORDER — LACTATED RINGERS IV SOLN
INTRAVENOUS | Status: DC
  Administered 2013-12-09: 1000 mL via INTRAVENOUS

## 2013-12-09 MED ORDER — BUTAMBEN-TETRACAINE-BENZOCAINE 2-2-14 % EX AERO
INHALATION_SPRAY | CUTANEOUS | Status: DC | PRN
  Administered 2013-12-09 (×2): 1 via TOPICAL

## 2013-12-09 NOTE — Op Note (Signed)
[facility] [address] [place], [address]   ENDOSCOPY PROCEDURE REPORT  PATIENT: Lee Nelson, Lee Nelson  MR#: [id number] BIRTHDATE: Jun 26, 1940 , 73  yrs. old GENDER: Male ENDOSCOPIST: Willis Modena, MD REFERRED BY:  Chilton Greathouse, M.D. PROCEDURE DATE:  12/09/2013 PROCEDURE:  EGD, diagnostic ASA CLASS:     Class II INDICATIONS:  dysphagia. MEDICATIONS: MAC sedation, administered by CRNA TOPICAL ANESTHETIC: Cetacaine Spray  DESCRIPTION OF PROCEDURE: After the risks benefits and alternatives of the procedure were thoroughly explained, informed consent was obtained.  The Pentax Gastroscope [id number] endoscope was introduced through the mouth and advanced to the second portion of the duodenum. Without limitations.  The instrument was slowly withdrawn as the mucosa was fully examined.    Findings:  Normal esophagus; no esophageal stricture, mass, varices, or Barrett's mucosa was seen; no mucosal features to suggest eosinophilic esophagitis were identified.  Normal stomach, pylorus, and duodenum to the second portion.              The scope was then withdrawn from the patient and the procedure completed.  ENDOSCOPIC IMPRESSION:     As above.  No fixed esophageal lesion to explain patient's dysphagia was identified.  Suspect dysphagia is multifactorial (esophageal dysmotility, oropharyngeal dysphagia).  RECOMMENDATIONS:     1.  Watch for potential complications of procedure. 2.  Speech Therapy consultation for investigation of possible oropharyngeal dysphagia. 3.  Follow-up with [facility] in 3 months.  eSigned:  Willis Modena, MD 12/09/2013 12:50 PM   CC:

## 2013-12-09 NOTE — Anesthesia Procedure Notes (Addendum)
Procedure Name: MAC Date/Time: 12/09/2013 12:24 PM Performed by: Lovie Chol Pre-anesthesia Checklist: Patient identified, Emergency Drugs available, Suction available, Timeout performed and Patient being monitored Patient Re-evaluated:Patient Re-evaluated prior to inductionOxygen Delivery Method: Nasal cannula Preoxygenation: Pre-oxygenation with 100% oxygen

## 2013-12-09 NOTE — Anesthesia Preprocedure Evaluation (Addendum)
Anesthesia Evaluation  Patient identified by MRN, date of birth, ID band Patient awake and Patient confused    Reviewed: Allergy & Precautions, H&P , NPO status , Patient's Chart, lab work & pertinent test results  Airway Mallampati: III TM Distance: >3 FB Neck ROM: Full    Dental  (+) Teeth Intact, Dental Advisory Given   Pulmonary          Cardiovascular Rhythm:Regular Rate:Normal     Neuro/Psych    GI/Hepatic GERD-  Medicated and Controlled,  Endo/Other    Renal/GU Renal InsufficiencyRenal disease     Musculoskeletal   Abdominal   Peds  Hematology negative hematology ROS (+)   Anesthesia Other Findings   Reproductive/Obstetrics negative OB ROS                           Anesthesia Physical Anesthesia Plan  ASA: II  Anesthesia Plan: MAC   Post-op Pain Management:    Induction:   Airway Management Planned: Natural Airway  Additional Equipment:   Intra-op Plan:   Post-operative Plan:   Informed Consent: I have reviewed the patients History and Physical, chart, labs and discussed the procedure including the risks, benefits and alternatives for the proposed anesthesia with the patient or authorized representative who has indicated his/her understanding and acceptance.   Dental advisory given  Plan Discussed with: CRNA, Anesthesiologist and Surgeon  Anesthesia Plan Comments:         Anesthesia Quick Evaluation

## 2013-12-09 NOTE — Transfer of Care (Signed)
Immediate Anesthesia Transfer of Care Note  Patient: Lee Nelson  Procedure(s) Performed: Procedure(s): ESOPHAGOGASTRODUODENOSCOPY (EGD) (N/A)  Patient Location: Endoscopy Unit  Anesthesia Type:MAC  Level of Consciousness: awake, alert , oriented and patient cooperative  Airway & Oxygen Therapy: Patient Spontanous Breathing and Patient connected to nasal cannula oxygen  Post-op Assessment: Report given to PACU RN and Post -op Vital signs reviewed and stable  Post vital signs: Reviewed  Complications: No apparent anesthesia complications

## 2013-12-09 NOTE — H&P (Signed)
Patient interval history reviewed.  Patient examined again.  There has been no change from documented H/P (scanned into chart from our office) except as documented above.  Assessment:  1.  Dysphagia.  Plan:  1.  Endoscopy with possible esophageal dilatation. 2.  Risks (bleeding, infection, bowel perforation that could require surgery, sedation-related changes in cardiopulmonary systems), benefits (identification and possible treatment of source of symptoms, exclusion of certain causes of symptoms), and alternatives (watchful waiting, radiographic imaging studies, empiric medical treatment) of upper endoscopy with possible esophageal dilatation (EGD +/- DIL) were explained to patient/family in detail and patient wishes to proceed.

## 2013-12-09 NOTE — Discharge Instructions (Signed)
Endoscopy °Care After °Please read the instructions outlined below and refer to this sheet in the next few weeks. These discharge instructions provide you with general information on caring for yourself after you leave the hospital. Your doctor may also give you specific instructions. While your treatment has been planned according to the most current medical practices available, unavoidable complications occasionally occur. If you have any problems or questions after discharge, please call Dr. Abrar Bilton ([facility]) at [phone]. ° °HOME CARE INSTRUCTIONS °Activity °· You may resume your regular activity but move at a slower pace for the next 24 hours.  °· Take frequent rest periods for the next 24 hours.  °· Walking will help expel (get rid of) the air and reduce the bloated feeling in your abdomen.  °· No driving for 24 hours (because of the anesthesia (medicine) used during the test).  °· You may shower.  °· Do not sign any important legal documents or operate any machinery for 24 hours (because of the anesthesia used during the test).  °Nutrition °· Drink plenty of fluids.  °· You may resume your normal diet.  °· Begin with a light meal and progress to your normal diet.  °· Avoid alcoholic beverages for 24 hours or as instructed by your caregiver.  °Medications °You may resume your normal medications unless your caregiver tells you otherwise. °What you can expect today °· You may experience abdominal discomfort such as a feeling of fullness or "gas" pains.  °· You may experience a sore throat for 2 to 3 days. This is normal. Gargling with salt water may help this.  °·  °SEEK IMMEDIATE MEDICAL CARE IF: °· You have excessive nausea (feeling sick to your stomach) and/or vomiting.  °· You have severe abdominal pain and distention (swelling).  °· You have trouble swallowing.  °· You have a temperature over 100° F (37.8° C).  °· You have rectal bleeding or vomiting of blood.  °Document Released:  10/30/2003 Document Revised: 11/27/2010 Document Reviewed: 05/12/2007 °ExitCare® Patient Information ©2012 [employer]. °

## 2013-12-12 ENCOUNTER — Encounter (HOSPITAL_COMMUNITY): Payer: Self-pay | Admitting: Gastroenterology

## 2013-12-14 NOTE — Anesthesia Postprocedure Evaluation (Signed)
  Anesthesia Post-op Note  Patient: Lee Nelson  Procedure(s) Performed: Procedure(s): ESOPHAGOGASTRODUODENOSCOPY (EGD) (N/A)  Patient Location: Endoscopy Unit  Anesthesia Type:MAC  Level of Consciousness: awake and alert   Airway and Oxygen Therapy: Patient Spontanous Breathing  Post-op Pain: none  Post-op Assessment: Post-op Vital signs reviewed, Patient's Cardiovascular Status Stable, Respiratory Function Stable and Patent Airway  Post-op Vital Signs: Reviewed and stable  Last Vitals:  Filed Vitals:   12/09/13 1330  BP: 131/62  Pulse: 69  Temp:   Resp: 17    Complications: No apparent anesthesia complications

## 2013-12-16 ENCOUNTER — Ambulatory Visit (HOSPITAL_COMMUNITY)
Admission: RE | Admit: 2013-12-16 | Discharge: 2013-12-16 | Disposition: A | Discharge status: Home / Self Care | Payer: BC Managed Care – PPO | Source: Ambulatory Visit | Attending: Orthopedic Surgery | Admitting: Orthopedic Surgery

## 2013-12-16 DIAGNOSIS — M19079 Primary osteoarthritis, unspecified ankle and foot: Secondary | ICD-10-CM | POA: Insufficient documentation

## 2013-12-16 DIAGNOSIS — M25571 Pain in right ankle and joints of right foot: Secondary | ICD-10-CM

## 2013-12-16 DIAGNOSIS — M25579 Pain in unspecified ankle and joints of unspecified foot: Secondary | ICD-10-CM | POA: Diagnosis present

## 2014-02-03 ENCOUNTER — Other Ambulatory Visit (HOSPITAL_COMMUNITY): Payer: Self-pay | Admitting: Internal Medicine

## 2014-02-03 DIAGNOSIS — R1314 Dysphagia, pharyngoesophageal phase: Secondary | ICD-10-CM

## 2014-02-09 ENCOUNTER — Inpatient Hospital Stay (HOSPITAL_COMMUNITY): Admission: RE | Admit: 2014-02-09 | Payer: BC Managed Care – PPO | Source: Ambulatory Visit

## 2014-02-09 ENCOUNTER — Ambulatory Visit (HOSPITAL_COMMUNITY): Payer: BC Managed Care – PPO

## 2014-02-16 ENCOUNTER — Ambulatory Visit: Payer: BC Managed Care – PPO

## 2014-03-10 ENCOUNTER — Ambulatory Visit (INDEPENDENT_AMBULATORY_CARE_PROVIDER_SITE_OTHER): Payer: BC Managed Care – PPO | Admitting: Diagnostic Neuroimaging

## 2014-03-10 ENCOUNTER — Encounter: Payer: Self-pay | Admitting: Diagnostic Neuroimaging

## 2014-03-10 DIAGNOSIS — H539 Unspecified visual disturbance: Secondary | ICD-10-CM

## 2014-03-10 NOTE — Progress Notes (Signed)
[name]  PATIENT: Lee Nelson DOB: [birth year]  REFERRING CLINICIAN: Avva HISTORY FROM: patient  REASON FOR VISIT: new consult   HISTORICAL  CHIEF COMPLAINT:  Chief Complaint  Patient presents with  . Follow-up    HISTORY OF PRESENT ILLNESS:   UPDATE 12/11/15L Since last visit, no more double vision attacks. Notes some work finding diff. Still with stress from his wife's quadriplegia and 24 hour care at home.   PRIOR HPI (09/07/13): 73 year old right-handed male with history of neck pain, back pain, skin cancer, reflux disease, osteoporosis, anxiety, fibromyalgia, migraine, here for evaluation of headaches and transient double vision. Previous evaluated patient in 2012 for right arm numbness. Patient reports over ten-year history of intermittent neck pain. Patient has some degenerative cervical spine disease managed conservatively. Patient has one to 2 year history of intermittent headaches mainly in the right parietal and right occipital region. Some pain radiates from the neck up to the head. No nausea or vomiting. No photophobia or phonophobia. Sometimes he has sharp shooting brief pains. He rates pain severity 2-3/10. More recently headache symptoms have been more constant. No visual symptoms associated with headache. Separately patient has intermittent visual disturbance where he sees "swimming lines" in front of him, but these are not associated with headache. 10 days ago patient was at a meeting talking to some people, when he had sudden onset of vertical double vision lasting for one to 2 minutes. He had mild queasiness sensation. No vomiting. He had mild equilibrium problem without falling out of his chair. No slurred speech. No numbness or tingling. Symptoms resolved on their own. No headaches associated with this.    REVIEW OF SYSTEMS: Full 14 system review of systems performed and notable only for mild memory loss anxiety feeling cold trouble  swallowing hearing loss constipation.  ALLERGIES: Allergies  Allergen Reactions  . Metoclopramide Hcl Other (See Comments)    REACTION: "involuntary movements"  . Metronidazole Other (See Comments)    REACTION: no appetite, diarrhea after meal, decrease in weight  . Sulfamethoxazole-Trimethoprim Other (See Comments)    REACTION: "involuntary movements"    HOME MEDICATIONS: Outpatient Prescriptions Prior to Visit  Medication Sig Dispense Refill  . ALPRAZolam (XANAX) 0.25 MG tablet Take 0.0625-0.25 mg by mouth at bedtime.     Marland Kitchen aspirin EC 81 MG tablet Take 81 mg by mouth daily.    . cholecalciferol (VITAMIN D) 1000 UNITS tablet Take 1,000 Units by mouth 2 (two) times daily.    . Coenzyme Q10 (VITALINE COQ10) 300 MG WAFR Take 2 tablets by mouth daily.    . Cranberry 400 MG CAPS Take 400-1,200 mg by mouth every morning.     . fexofenadine (ALLEGRA) 60 MG tablet Take 30 mg by mouth 2 (two) times daily.    . lansoprazole (PREVACID) 15 MG capsule Take 15 mg by mouth every morning.     . Melatonin 5 MG TABS Take 1.25-2.5 mg by mouth at bedtime.     . Milk Thistle 175 MG CAPS Take 1 capsule by mouth 3 (three) times daily.    . Multiple Vitamin (MULTIVITAMIN) capsule Take 1 capsule by mouth every morning.     Marland Kitchen OVER THE COUNTER MEDICATION Take 2 capsules by mouth every morning. Lion's mane 0.5 gram/capsule    . OVER THE COUNTER MEDICATION Take 1 tablet by mouth every morning. Bacopa - 750 mg    . OVER THE COUNTER MEDICATION Take 1 tablet by mouth daily with supper. Bone Strength    .  OVER THE COUNTER MEDICATION Take 2 capsules by mouth every morning. Reparagen supplement    . OVER THE COUNTER MEDICATION Take 1 tablet by mouth daily as needed (for eye migraine). Mygra Few    . OVER THE COUNTER MEDICATION Take 50 mg by mouth 3 (three) times daily. Magnesium 50 mg    . OVER THE COUNTER MEDICATION Take 2 capsules by mouth daily. Panax Ginseng    . Phosphatidylserine 100 MG CAPS Take 200 mg by  mouth every morning.    . Saw Palmetto, Serenoa repens, (SAW PALMETTO PO) Take 1 capsule by mouth 2 (two) times daily.    . sodium chloride (MURO 128) 5 % ophthalmic ointment Place 1 application into both eyes at bedtime.    . THEANINE PO Take 2 tablets by mouth 2 (two) times daily.     . vitamin C (ASCORBIC ACID) 250 MG tablet Take 250 mg by mouth daily.    . zaleplon (SONATA) 5 MG capsule Take 5 mg by mouth daily as needed for sleep. Usually takes at 3 am     No facility-administered medications prior to visit.    PAST MEDICAL HISTORY: Past Medical History  Diagnosis Date  . Allergic rhinitis   . GERD (gastroesophageal reflux disease)   . Skin cancer   . Complication of anesthesia     " I had some coughing afterwards for a couple of days"  . Shortness of breath     with exertion  . Early cataracts, bilateral   . Diverticulosis   . Mitral valve prolapse   . Kidney stones   . Gall stones   . Headache(784.0)   . MRSA (methicillin resistant Staphylococcus aureus) carrier   . Constipation   . Thrombocytopenia     PAST SURGICAL HISTORY: Past Surgical History  Procedure Laterality Date  . Combined hysterectomy abdominal w/ mmk / burch procedure    . Tonsillectomy    . Hernia repair    . Appendectomy    . Colonoscopy w/ biopsies and polypectomy    . Nissen fundoplication    . Esophagogastroduodenoscopy N/A 12/09/2013    Procedure: ESOPHAGOGASTRODUODENOSCOPY (EGD);  Surgeon: Arta Silence, MD;  Location: [facility] ENDOSCOPY;  Service: Endoscopy;  Laterality: N/A;    FAMILY HISTORY: Family History  Problem Relation Age of Onset  . Parkinsonism Brother   . COPD Mother   . Allergies Mother   . Heart failure Mother   . COPD Father   . Stroke Father   . Cancer Maternal Grandfather   . Other Sister   . Suicidality Maternal Aunt     SOCIAL HISTORY:  History   Social History  . Marital Status: Married    Spouse Name: Neoma Laming    Number of Children: 1  . Years of Education:  BA, [place], [place]   Occupational History  . teaches constitutional law   . PROFESSOR    Social History Main Topics  . Smoking status: Never Smoker   . Smokeless tobacco: Never Used  . Alcohol Use: Yes     Comment: social  . Drug Use: No  . Sexual Activity: Not on file   Other Topics Concern  . Not on file   Social History Narrative   Patient lives at home wife.   Daily caffeine use     PHYSICAL EXAM  Filed Vitals:   03/10/14 1124  BP: 101/58  Pulse: 55  Height: _0  (1.778 m)  Weight: 133 lb (60.328 kg)    Not recorded  Body mass index is 19.08 kg/(m^2).  GENERAL EXAM: Patient is in no distress; well developed, nourished and groomed; neck is supple  CARDIOVASCULAR: Regular rate and rhythm, no murmurs, no carotid bruits  NEUROLOGIC: MENTAL STATUS: awake, alert, oriented to person, place and time, recent and remote memory intact, normal attention and concentration, language fluent, comprehension intact, naming intact, fund of knowledge appropriate CRANIAL NERVE: no papilledema on fundoscopic exam, pupils equal and reactive to light, visual fields full to confrontation, extraocular muscles intact, no nystagmus, facial sensation and strength symmetric, hearing intact, palate elevates symmetrically, uvula midline, shoulder shrug symmetric, tongue midline. MOTOR: normal bulk and tone, full strength in the BUE, BLE SENSORY: normal and symmetric to light touch, pinprick, temperature, vibration  COORDINATION: MILD ACTION TREMOR WITH FINGER NOSE FINGER REFLEXES: deep tendon reflexes BRISK and symmetric GAIT/STATION: narrow based gait; romberg is negative    DIAGNOSTIC DATA (LABS, IMAGING, TESTING) - I reviewed patient records, labs, notes, testing and imaging myself where available.  Lab Results  Component Value Date   WBC 5.6 06/29/2008   HGB 14.2 06/29/2008   HCT 40.5 06/29/2008   MCV 95.5 06/29/2008   PLT 123* 06/29/2008      Component Value Date/Time   NA  138 06/29/2008 2305   K 4.1 06/29/2008 2305   CL 106 06/29/2008 2305   CO2 27 06/29/2008 2305   GLUCOSE 110* 06/29/2008 2305   BUN 18 06/29/2008 2305   CREATININE 0.76 06/29/2008 2305   CALCIUM 9.2 06/29/2008 2305   GFRNONAA >60 06/29/2008 2305   GFRAA  06/29/2008 2305    >60        The eGFR has been calculated using the MDRD equation. This calculation has not been validated in all clinical situations. eGFR's persistently <60 mL/min signify possible Chronic Kidney Disease.   No results found for: CHOL No results found for: HGBA1C No results found for: VITAMINB12 No results found for: TSH  07/20/10 MRI CERVICAL 1. Left C6-C7 paracentral disc protrusion abutting the exiting left C7 nerve roots appears new. Preexisting mild left C7 foraminal stenosis at this level due to facet hypertrophy.  2. Mild combined congenital and acquired spinal stenosis at the C3-C4 and C4-C5.  11/20/10 MRI brain (without contrast) demonstrating: 1. Moderate ventriculomegaly in the temporal and occipital horns.  Likely due to subcortical atrophy. 2. No acute findings are seen.  11/20/10 MRA head (without contrast) demonstrating: - mild atheromatous irregularities in the bilateral carotid siphon regions.  11/19/10 EMG/NCS - normal  11/19/10 carotid u/s - normal  11/18/10 TTE - EF 55-60%; normal wall motion  09/19/13 MRI brain (with and without) demonstrating: 1. Moderate mesial temporal atrophy. 2. Moderate ventriculomegaly on ex vacuo basis. 3. No acute findings.   ASSESSMENT AND PLAN  73 y.o. year old male here with chronic neck pain x 10 years, int HA x 1-2 years, with a 2 minute episode of vertical double vision in June 2015, with no recurrence. Ddx: migraine phenomenon, TIA, inner ear pathology, myasthenia gravis. Will monitor for recurrence.   PLAN: - Continue aspirin 35m daily - Monitor symptoms; if recurrence, then come back to office; otherwise follow up as needed  Return if symptoms  worsen or fail to improve.    VPenni Bombard MD 116/96/7893 181:01PM Certified in Neurology, Neurophysiology and Neuroimaging  [facility] [address] [place] [phone]

## 2014-03-10 NOTE — Patient Instructions (Signed)
Follow up as needed

## 2014-05-07 ENCOUNTER — Emergency Department (HOSPITAL_COMMUNITY): Payer: BLUE CROSS/BLUE SHIELD

## 2014-05-07 ENCOUNTER — Encounter (HOSPITAL_COMMUNITY): Payer: Self-pay | Admitting: *Deleted

## 2014-05-07 ENCOUNTER — Emergency Department (HOSPITAL_COMMUNITY)
Admission: EM | Admit: 2014-05-07 | Discharge: 2014-05-08 | Disposition: A | Discharge status: Home / Self Care | Payer: BLUE CROSS/BLUE SHIELD | Attending: Emergency Medicine | Admitting: Emergency Medicine

## 2014-05-07 DIAGNOSIS — K8020 Calculus of gallbladder without cholecystitis without obstruction: Secondary | ICD-10-CM

## 2014-05-07 DIAGNOSIS — Z87442 Personal history of urinary calculi: Secondary | ICD-10-CM | POA: Diagnosis not present

## 2014-05-07 DIAGNOSIS — K219 Gastro-esophageal reflux disease without esophagitis: Secondary | ICD-10-CM | POA: Diagnosis not present

## 2014-05-07 DIAGNOSIS — Z79899 Other long term (current) drug therapy: Secondary | ICD-10-CM | POA: Insufficient documentation

## 2014-05-07 DIAGNOSIS — R079 Chest pain, unspecified: Secondary | ICD-10-CM | POA: Diagnosis present

## 2014-05-07 DIAGNOSIS — H269 Unspecified cataract: Secondary | ICD-10-CM | POA: Diagnosis not present

## 2014-05-07 DIAGNOSIS — K5900 Constipation, unspecified: Secondary | ICD-10-CM | POA: Insufficient documentation

## 2014-05-07 DIAGNOSIS — Z8614 Personal history of Methicillin resistant Staphylococcus aureus infection: Secondary | ICD-10-CM | POA: Insufficient documentation

## 2014-05-07 DIAGNOSIS — I341 Nonrheumatic mitral (valve) prolapse: Secondary | ICD-10-CM | POA: Diagnosis not present

## 2014-05-07 DIAGNOSIS — Z862 Personal history of diseases of the blood and blood-forming organs and certain disorders involving the immune mechanism: Secondary | ICD-10-CM | POA: Diagnosis not present

## 2014-05-07 DIAGNOSIS — R1013 Epigastric pain: Secondary | ICD-10-CM

## 2014-05-07 DIAGNOSIS — Z7982 Long term (current) use of aspirin: Secondary | ICD-10-CM | POA: Diagnosis not present

## 2014-05-07 DIAGNOSIS — Z85828 Personal history of other malignant neoplasm of skin: Secondary | ICD-10-CM | POA: Diagnosis not present

## 2014-05-07 LAB — CBC WITH DIFFERENTIAL/PLATELET
Basophils Absolute: 0 10*3/uL (ref 0.0–0.1)
Basophils Relative: 1 % (ref 0–1)
Eosinophils Absolute: 0.4 10*3/uL (ref 0.0–0.7)
Eosinophils Relative: 6 % — ABNORMAL HIGH (ref 0–5)
HCT: 43.5 % (ref 39.0–52.0)
Hemoglobin: 15 g/dL (ref 13.0–17.0)
Lymphocytes Relative: 26 % (ref 12–46)
Lymphs Abs: 1.7 10*3/uL (ref 0.7–4.0)
MCH: 31.8 pg (ref 26.0–34.0)
MCHC: 34.5 g/dL (ref 30.0–36.0)
MCV: 92.2 fL (ref 78.0–100.0)
Monocytes Absolute: 0.6 10*3/uL (ref 0.1–1.0)
Monocytes Relative: 9 % (ref 3–12)
Neutro Abs: 3.7 10*3/uL (ref 1.7–7.7)
Neutrophils Relative %: 58 % (ref 43–77)
Platelets: 129 10*3/uL — ABNORMAL LOW (ref 150–400)
RBC: 4.72 MIL/uL (ref 4.22–5.81)
RDW: 13.3 % (ref 11.5–15.5)
WBC: 6.3 10*3/uL (ref 4.0–10.5)

## 2014-05-07 NOTE — ED Provider Notes (Signed)
CSN: [id number]     Arrival date & time 05/07/14  2253 History   First MD Initiated Contact with Patient 05/07/14 2308     Chief Complaint  Patient presents with  . Chest Pain     (Consider location/radiation/quality/duration/timing/severity/associated sxs/prior Treatment) HPI  Pt presenting with c/o epigastric pain.  Pt states pain was severe and constant tonight, has eased off on its own.  No nausea/vomiting.  No fever/chills.  No chest pain.  No diaphoresis, no radiation of pain.  Has been having more frequent bowel movements over the past several days.  No diarrhea.  Denies dysuria.  Pt states he has hx of gallstones but has not had significant issues with this in the past.  There are no other associated systemic symptoms, there are no other alleviating or modifying factors.   Past Medical History  Diagnosis Date  . Allergic rhinitis   . GERD (gastroesophageal reflux disease)   . Skin cancer   . Complication of anesthesia     " I had some coughing afterwards for a couple of days"  . Shortness of breath     with exertion  . Early cataracts, bilateral   . Diverticulosis   . Mitral valve prolapse   . Kidney stones   . Gall stones   . Headache(784.0)   . MRSA (methicillin resistant Staphylococcus aureus) carrier   . Constipation   . Thrombocytopenia    Past Surgical History  Procedure Laterality Date  . Combined hysterectomy abdominal w/ mmk / burch procedure    . Tonsillectomy    . Hernia repair    . Appendectomy    . Colonoscopy w/ biopsies and polypectomy    . Nissen fundoplication    . Esophagogastroduodenoscopy N/A 12/09/2013    Procedure: ESOPHAGOGASTRODUODENOSCOPY (EGD);  Surgeon: Willis Modena, MD;  Location: [facility] ENDOSCOPY;  Service: Endoscopy;  Laterality: N/A;   Family History  Problem Relation Age of Onset  . Parkinsonism Brother   . COPD Mother   . Allergies Mother   . Heart failure Mother   . COPD Father   . Stroke Father   . Cancer Maternal Grandfather    . Other Sister   . Suicidality Maternal Aunt    History  Substance Use Topics  . Smoking status: Never Smoker   . Smokeless tobacco: Never Used  . Alcohol Use: Yes     Comment: social    Review of Systems  ROS reviewed and all otherwise negative except for mentioned in HPI    Allergies  Metoclopramide hcl; Metronidazole; and Sulfamethoxazole-trimethoprim  Home Medications   Prior to Admission medications   Medication Sig Start Date End Date Taking? Authorizing Provider  ALPRAZolam Prudy Feeler) 0.25 MG tablet Take 0.0625-0.25 mg by mouth at bedtime as needed for sleep.    Yes Historical Provider, MD  aspirin EC 81 MG tablet Take 81 mg by mouth daily.   Yes Historical Provider, MD  cholecalciferol (VITAMIN D) 1000 UNITS tablet Take 1,000 Units by mouth 2 (two) times daily.   Yes Historical Provider, MD  fexofenadine (ALLEGRA) 60 MG tablet Take 30 mg by mouth 2 (two) times daily.   Yes Historical Provider, MD  lansoprazole (PREVACID) 15 MG capsule Take 15 mg by mouth every morning.    Yes Historical Provider, MD  Melatonin 5 MG TABS Take 1.25-2.5 mg by mouth at bedtime.    Yes Historical Provider, MD  Milk Thistle 175 MG CAPS Take 1 capsule by mouth daily.    Yes Historical  Provider, MD  Multiple Vitamin (MULTIVITAMIN) capsule Take 1 capsule by mouth every morning.    Yes Historical Provider, MD  OVER THE COUNTER MEDICATION Take 3 capsules by mouth 2 (two) times daily. Lion's mane 0.5 gram/capsule   Yes Historical Provider, MD  OVER THE COUNTER MEDICATION Take 1 tablet by mouth every morning. Bacopa - 750 mg   Yes Historical Provider, MD  OVER THE COUNTER MEDICATION Take 2 capsules by mouth every morning. Reparagen supplement   Yes Historical Provider, MD  OVER THE COUNTER MEDICATION Take 1 tablet by mouth daily as needed (for eye migraine). Mygra Few   Yes Historical Provider, MD  OVER THE COUNTER MEDICATION Take 2 capsules by mouth daily. Panax Ginseng   Yes Historical Provider, MD   Phosphatidylserine 100 MG CAPS Take 200 mg by mouth every morning.   Yes Historical Provider, MD  Saw Palmetto, Serenoa repens, (SAW PALMETTO PO) Take 1 capsule by mouth 2 (two) times daily.   Yes Historical Provider, MD  sodium chloride (MURO 128) 5 % ophthalmic ointment Place 1 application into both eyes at bedtime.   Yes Historical Provider, MD  THEANINE PO Take 2 tablets by mouth 2 (two) times daily.    Yes Historical Provider, MD  vitamin C (ASCORBIC ACID) 250 MG tablet Take 250 mg by mouth daily.   Yes Historical Provider, MD  zaleplon (SONATA) 5 MG capsule Take 5 mg by mouth daily as needed for sleep. Usually takes at 3 am   Yes Historical Provider, MD  Coenzyme Q10 (VITALINE COQ10) 300 MG WAFR Take 2 tablets by mouth daily.    Historical Provider, MD  Cranberry 400 MG CAPS Take 400-1,200 mg by mouth every morning.     Historical Provider, MD  ondansetron (ZOFRAN) 4 MG tablet Take 1 tablet (4 mg total) by mouth every 6 (six) hours. 05/08/14   Ethelda Chick, MD  OVER THE COUNTER MEDICATION Take 1 tablet by mouth daily with supper. Bone Strength    Historical Provider, MD  OVER THE COUNTER MEDICATION Take 50 mg by mouth 3 (three) times daily. Magnesium 50 mg    Historical Provider, MD  oxyCODONE-acetaminophen (PERCOCET/ROXICET) 5-325 MG per tablet Take 1-2 tablets by mouth every 6 (six) hours as needed for severe pain. 05/08/14   Ethelda Chick, MD   BP 110/64 mmHg  Pulse 68  Temp(Src) 98.5 F (36.9 C) (Oral)  Resp 9  Ht  (1.778 m)  Wt 132 lb (59.875 kg)  BMI 18.94 kg/m2  SpO2 98%  Vitals reviewed Physical Exam  Physical Examination: General appearance - alert, well appearing, and in no distress Mental status - alert, oriented to person, place, and time Eyes - no conjunctival injection, no scleral icterus Mouth - mucous membranes moist, pharynx normal without lesions Chest - clear to auscultation, no wheezes, rales or rhonchi, symmetric air entry Heart - normal rate, regular  rhythm, normal S1, S2, no murmurs, rubs, clicks or gallops Abdomen - soft, mild epigastric tenderness to palpation, no gaurding or rebound, nabs, nondistended, no masses or organomegaly Extremities - peripheral pulses normal, no pedal edema, no clubbing or cyanosis Skin - normal coloration and turgor, no rashes  ED Course  Procedures (including critical care time) Labs Review Labs Reviewed  CBC WITH DIFFERENTIAL/PLATELET - Abnormal; Notable for the following:    Platelets 129 (*)    Eosinophils Relative 6 (*)    All other components within normal limits  COMPREHENSIVE METABOLIC PANEL - Abnormal; Notable for the  following:    Glucose, Bld 105 (*)    GFR calc non Af Amer 86 (*)    All other components within normal limits  LIPASE, BLOOD  URINALYSIS, ROUTINE W REFLEX MICROSCOPIC  TROPONIN I    Imaging Review Dg Chest 2 View  05/08/2014   CLINICAL DATA:  Acute onset of lower mid chest pain. Initial encounter.  EXAM: CHEST  2 VIEW  COMPARISON:  Chest radiograph performed 06/29/2008  FINDINGS: The lungs are well-aerated. Scarring is noted at the lung apices, more prominent on the right, similar in appearance to the prior study. A few calcified granulomata are noted at the upper lung zones. There is no evidence of pleural effusion or pneumothorax. Bilateral nipple shadows are seen.  The heart is normal in size; the mediastinal contour is within normal limits. No acute osseous abnormalities are seen.  IMPRESSION: 1. No acute cardiopulmonary process identified. 2. Biapical scarring, more prominent on the right, stable from 2010.   Electronically Signed   By: Roanna RaiderJeffery  Chang M.D.   On: 05/08/2014 00:10   [place] Abdomen Limited  05/08/2014   CLINICAL DATA:  Acute onset of epigastric abdominal pain, radiating to the groin. Initial encounter.  EXAM: US ABDOMEN LIMITED - RIGHT UPPER QUADRANT  COMPARISON:  Abdominal ultrasound performed 03/12/2012, and CT of the abdomen and pelvis from 11/03/2008  FINDINGS:  Gallbladder:  Multiple stones and likely sludge are seen layering dependently within the gallbladder. The gallbladder is otherwise unremarkable in appearance. No gallbladder wall thickening or pericholecystic fluid is seen. No ultrasonographic Murphy's sign is elicited.  Common bile duct:  Diameter: 0.4 cm, within normal limits in caliber.  Liver:  A 5.2 x 4.6 x 3.7 cm anechoic cyst is noted within the left hepatic lobe. Additional known cysts are not well characterized on ultrasound. Within normal limits in parenchymal echogenicity.  IMPRESSION: 1. Cholelithiasis and likely sludge within the gallbladder; gallbladder otherwise unremarkable in appearance. No evidence for obstruction or cholecystitis. 2. Left hepatic cyst again noted.   Electronically Signed   By: Roanna RaiderJeffery  Chang M.D.   On: 05/08/2014 01:42     EKG Interpretation   Date/Time:  Sunday May 07 2014 22:57:19 EST Ventricular Rate:  71 PR Interval:  156 QRS Duration: 96 QT Interval:  392 QTC Calculation: 425 R Axis:   89 Text Interpretation:  Normal sinus rhythm Normal ECG some baseline  artiface No old tracing to compare Confirmed by [facility]  MD, MARTHA [phone]  on 05/07/2014 11:12:50 PM      MDM   Final diagnoses:  Gallstones  Epigastric pain    Pt presenting with epigastric pain.  Workup reveals reassuring EKG, negative troponin, LFTs and lipase reassuring.  Abdominal ultrasound shows cholelithiasis wtihout cholecystitis.  Pt has not required pain or nausea medication in the ED.  He is requesting discharge.  Feel biliary colic is the most likely cause of his symptoms.  Doubt ACS.  Given information for surgery followup.  Discharged with strict return precautions.  Pt agreeable with plan.    Ethelda ChickMartha K Linker, MD 05/08/14 [phone]

## 2014-05-07 NOTE — ED Notes (Signed)
The opt has [place] gallstones  nio  Cardiac history

## 2014-05-07 NOTE — ED Notes (Signed)
The pt is c/o epigastric pain for 2-3 hours and the pain radiates to his rt groin.  It started yesterday  No n or v

## 2014-05-08 ENCOUNTER — Encounter (HOSPITAL_COMMUNITY): Payer: Self-pay

## 2014-05-08 ENCOUNTER — Ambulatory Visit (HOSPITAL_COMMUNITY): Payer: Self-pay

## 2014-05-08 ENCOUNTER — Emergency Department (HOSPITAL_COMMUNITY): Payer: BLUE CROSS/BLUE SHIELD

## 2014-05-08 LAB — COMPREHENSIVE METABOLIC PANEL
ALT: 18 U/L (ref 0–53)
AST: 26 U/L (ref 0–37)
Albumin: 4.5 g/dL (ref 3.5–5.2)
Alkaline Phosphatase: 80 U/L (ref 39–117)
Anion gap: 5 (ref 5–15)
BUN: 13 mg/dL (ref 6–23)
CO2: 31 mmol/L (ref 19–32)
Calcium: 10 mg/dL (ref 8.4–10.5)
Chloride: 104 mmol/L (ref 96–112)
Creatinine, Ser: 0.82 mg/dL (ref 0.50–1.35)
GFR calc Af Amer: >90 mL/min (ref >90)
GFR calc non Af Amer: 86 mL/min — ABNORMAL LOW (ref >90)
Glucose, Bld: 105 mg/dL — ABNORMAL HIGH (ref 70–99)
Potassium: 4.3 mmol/L (ref 3.5–5.1)
Sodium: 140 mmol/L (ref 135–145)
Total Bilirubin: 0.4 mg/dL (ref 0.3–1.2)
Total Protein: 7.3 g/dL (ref 6.0–8.3)

## 2014-05-08 LAB — LIPASE, BLOOD: Lipase: 29 U/L (ref 11–59)

## 2014-05-08 LAB — URINALYSIS, ROUTINE W REFLEX MICROSCOPIC
Bilirubin Urine: NEGATIVE
Glucose, UA: NEGATIVE mg/dL
Hgb urine dipstick: NEGATIVE
Ketones, ur: NEGATIVE mg/dL
Leukocytes, UA: NEGATIVE
Nitrite: NEGATIVE
Protein, ur: NEGATIVE mg/dL
Specific Gravity, Urine: 1.013 (ref 1.005–1.030)
Urobilinogen, UA: 0.2 mg/dL (ref 0.0–1.0)
pH: 5.5 (ref 5.0–8.0)

## 2014-05-08 LAB — TROPONIN I: Troponin I: <0.03 ng/mL (ref <0.031)

## 2014-05-08 MED ORDER — ONDANSETRON HCL 4 MG PO TABS: 4.0000 mg | ORAL_TABLET | Freq: Four times a day (QID) | ORAL | Status: DC

## 2014-05-08 MED ORDER — OXYCODONE-ACETAMINOPHEN 5-325 MG PO TABS: 1.0000 | ORAL_TABLET | Freq: Four times a day (QID) | ORAL | Status: DC | PRN

## 2014-05-08 NOTE — Discharge Instructions (Signed)
Return to the ED with any concerns including worsening pain, vomiting and not able to keep down liquids, fever/chills, chest pain, difficulty breathing, decreased level of alertness/lethargy, or any other alarming symptoms

## 2014-06-07 ENCOUNTER — Ambulatory Visit (HOSPITAL_COMMUNITY): Admission: RE | Admit: 2014-06-07 | Payer: BLUE CROSS/BLUE SHIELD | Source: Ambulatory Visit

## 2014-06-09 ENCOUNTER — Encounter (HOSPITAL_COMMUNITY): Payer: Self-pay

## 2014-06-09 ENCOUNTER — Ambulatory Visit (HOSPITAL_COMMUNITY)
Admission: RE | Admit: 2014-06-09 | Discharge: 2014-06-09 | Disposition: A | Discharge status: Home / Self Care | Payer: BLUE CROSS/BLUE SHIELD | Source: Ambulatory Visit | Attending: Internal Medicine | Admitting: Internal Medicine

## 2014-06-09 DIAGNOSIS — R1313 Dysphagia, pharyngeal phase: Secondary | ICD-10-CM | POA: Insufficient documentation

## 2014-06-09 DIAGNOSIS — K219 Gastro-esophageal reflux disease without esophagitis: Secondary | ICD-10-CM | POA: Insufficient documentation

## 2014-06-09 DIAGNOSIS — I341 Nonrheumatic mitral (valve) prolapse: Secondary | ICD-10-CM | POA: Diagnosis not present

## 2014-06-09 DIAGNOSIS — R1319 Other dysphagia: Secondary | ICD-10-CM | POA: Insufficient documentation

## 2014-06-09 DIAGNOSIS — R1310 Dysphagia, unspecified: Secondary | ICD-10-CM | POA: Diagnosis present

## 2014-06-09 NOTE — Procedures (Signed)
Objective Swallowing Evaluation: Fiberoptic Endoscopic Evaluation of Swallowing  Patient Details  Name: Lee Nelson MRN: [id number] Date of Birth: 1940-09-14  Today's Date: 06/09/2014 Time: SLP Start Time (ACUTE ONLY): 1059-SLP Stop Time (ACUTE ONLY): 1128 SLP Time Calculation (min) (ACUTE ONLY): 29 min  Past Medical History:  Past Medical History  Diagnosis Date  . Allergic rhinitis   . GERD (gastroesophageal reflux disease)   . Skin cancer   . Complication of anesthesia     " I had some coughing afterwards for a couple of days"  . Shortness of breath     with exertion  . Early cataracts, bilateral   . Diverticulosis   . Mitral valve prolapse   . Kidney stones   . Gall stones   . Headache(784.0)   . MRSA (methicillin resistant Staphylococcus aureus) carrier   . Constipation   . Thrombocytopenia    Past Surgical History:  Past Surgical History  Procedure Laterality Date  . Combined hysterectomy abdominal w/ mmk / burch procedure    . Tonsillectomy    . Hernia repair    . Appendectomy    . Colonoscopy w/ biopsies and polypectomy    . Nissen fundoplication    . Esophagogastroduodenoscopy N/A 12/09/2013    Procedure: ESOPHAGOGASTRODUODENOSCOPY (EGD);  Surgeon: Willis Modena, MD;  Location: [facility] ENDOSCOPY;  Service: Endoscopy;  Laterality: N/A;   HPI:  HPI: 74 yo male with h/o GERD presents with reports of solid foods sticking in his throat.  No Data Recorded  Assessment / Plan / Recommendation CHL IP CLINICAL IMPRESSIONS 06/09/2014  Dysphagia Diagnosis Mild pharyngeal phase dysphagia;Mild cervical esophageal phase dysphagia    Clinical impression Pt has a mild pharyngeal and cervical esophageal dysphagia with suspected esophageal component as well in patient with h/o GERD and esophageal dysmotility. Pt has a late-onset swallow trigger, likely due in part to age, with spillage most notable with thin liquids. Pt has excellent airway protection with no  penetration/aspiration observed throughout trials. Pt does have mild amount of residual material that remains at the level of the valleculae as well as the UES, which is reduced although not cleared with multiple reflexive swallows. Vallecular residue is not further reduced with a chin tuck. Recommend to continue with regular textures and thin liquids as tolerated, although pt may feel more comfortable consuming foods that are slightly softer or more moist. SLP also provided demonstration and Min cueing for pt to complete effortful swallows and Masako maneuver in order to maximize pharyngeal strength and assist with clearance of residuals. If these home exercises do not relieve pt's symptoms over time, he may benefit from further esophageal w/u and/or OP SLP.      CHL IP TREATMENT RECOMMENDATION 06/09/2014  Treatment Plan Recommendations Defer treatment plan to SLP at OP (as needed)     CHL IP DIET RECOMMENDATION 06/09/2014  Diet Recommendations Regular;Thin liquid  Liquid Administration via Cup;Straw  Medication Administration Whole meds with liquid  Compensations Slow rate;Small sips/bites;Multiple dry swallows after each bite/sip;Follow solids with liquid  Postural Changes and/or Swallow Maneuvers Seated upright 90 degrees;Upright 30-60 min after meal     CHL IP OTHER RECOMMENDATIONS 06/09/2014  Recommended Consults Consider esophageal assessment  Oral Care Recommendations Oral care BID  Other Recommendations (None)     CHL IP FOLLOW UP RECOMMENDATIONS 06/09/2014  Follow up Recommendations OP SLP if symptoms persist        Pertinent Vitals/Pain: n/a     SLP Swallow Goals     CHL IP  REASON FOR REFERRAL 06/09/2014  Reason for Referral Objectively evaluate swallowing function     CHL IP ORAL PHASE 06/09/2014  Oral Phase WFL             CHL IP PHARYNGEAL PHASE 06/09/2014  Pharyngeal Phase Impaired  Pharyngeal - Thin Straw Premature spillage to valleculae;Reduced tongue base  retraction;Pharyngeal residue - cp segment  Pharyngeal - Puree Reduced tongue base retraction;Pharyngeal residue - cp segment;Pharyngeal residue - valleculae  Penetration/Aspiration details (puree) (None)  Pharyngeal - Mechanical Soft Reduced tongue base retraction;Pharyngeal residue - cp segment;Pharyngeal residue - valleculae      CHL IP CERVICAL ESOPHAGEAL PHASE 06/09/2014  Cervical Esophageal Phase Impaired  Cervical Esophageal Comment reduced relaxation/clearance    CHL IP GO 06/09/2014  Functional Assessment Tool Used skilled clinical judgment  Functional Limitations Swallowing  Swallow Current Status (Z6109(G8996) CI  Swallow Goal Status (U0454(G8997) CI  Swallow Discharge Status (U9811(G8998) CI          Maxcine HamLaura Paiewonsky, M.A. CCC-SLP [phone]  Maxcine Hamaiewonsky, Shamela Haydon 06/09/2014, 1:30 PM

## 2014-09-13 ENCOUNTER — Other Ambulatory Visit: Payer: Self-pay | Admitting: Surgery

## 2014-09-26 ENCOUNTER — Encounter (HOSPITAL_COMMUNITY): Payer: Self-pay

## 2014-09-26 ENCOUNTER — Encounter (HOSPITAL_COMMUNITY)
Admission: RE | Admit: 2014-09-26 | Discharge: 2014-09-26 | Disposition: A | Discharge status: Home / Self Care | Payer: BLUE CROSS/BLUE SHIELD | Source: Ambulatory Visit | Attending: Surgery | Admitting: Surgery

## 2014-09-26 DIAGNOSIS — Z79899 Other long term (current) drug therapy: Secondary | ICD-10-CM | POA: Diagnosis not present

## 2014-09-26 DIAGNOSIS — K8011 Calculus of gallbladder with chronic cholecystitis with obstruction: Secondary | ICD-10-CM | POA: Diagnosis not present

## 2014-09-26 DIAGNOSIS — K219 Gastro-esophageal reflux disease without esophagitis: Secondary | ICD-10-CM | POA: Diagnosis not present

## 2014-09-26 DIAGNOSIS — R109 Unspecified abdominal pain: Secondary | ICD-10-CM | POA: Diagnosis present

## 2014-09-26 DIAGNOSIS — I341 Nonrheumatic mitral (valve) prolapse: Secondary | ICD-10-CM | POA: Diagnosis not present

## 2014-09-26 DIAGNOSIS — Z7982 Long term (current) use of aspirin: Secondary | ICD-10-CM | POA: Diagnosis not present

## 2014-09-26 HISTORY — DX: Unspecified osteoarthritis, unspecified site: M19.90

## 2014-09-26 HISTORY — DX: Anxiety disorder, unspecified: F41.9

## 2014-09-26 HISTORY — DX: Frequency of micturition: R35.0

## 2014-09-26 HISTORY — DX: Paresthesia of skin: R20.2

## 2014-09-26 LAB — CBC WITH DIFFERENTIAL/PLATELET
Basophils Absolute: 0 10*3/uL (ref 0.0–0.1)
Basophils Relative: 1 % (ref 0–1)
Eosinophils Absolute: 0.2 10*3/uL (ref 0.0–0.7)
Eosinophils Relative: 4 % (ref 0–5)
HCT: 43 % (ref 39.0–52.0)
Hemoglobin: 14.4 g/dL (ref 13.0–17.0)
Lymphocytes Relative: 32 % (ref 12–46)
Lymphs Abs: 1.6 10*3/uL (ref 0.7–4.0)
MCH: 31.7 pg (ref 26.0–34.0)
MCHC: 33.5 g/dL (ref 30.0–36.0)
MCV: 94.7 fL (ref 78.0–100.0)
Monocytes Absolute: 0.3 10*3/uL (ref 0.1–1.0)
Monocytes Relative: 6 % (ref 3–12)
Neutro Abs: 2.9 10*3/uL (ref 1.7–7.7)
Neutrophils Relative %: 57 % (ref 43–77)
Platelets: 115 10*3/uL — ABNORMAL LOW (ref 150–400)
RBC: 4.54 MIL/uL (ref 4.22–5.81)
RDW: 12.8 % (ref 11.5–15.5)
WBC: 5.1 10*3/uL (ref 4.0–10.5)

## 2014-09-26 LAB — COMPREHENSIVE METABOLIC PANEL
ALT: 14 U/L — ABNORMAL LOW (ref 17–63)
AST: 21 U/L (ref 15–41)
Albumin: 4.3 g/dL (ref 3.5–5.0)
Alkaline Phosphatase: 77 U/L (ref 38–126)
Anion gap: 9 (ref 5–15)
BUN: 15 mg/dL (ref 6–20)
CO2: 27 mmol/L (ref 22–32)
Calcium: 9.4 mg/dL (ref 8.9–10.3)
Chloride: 105 mmol/L (ref 101–111)
Creatinine, Ser: 0.73 mg/dL (ref 0.61–1.24)
GFR calc Af Amer: >60 mL/min (ref >60)
GFR calc non Af Amer: >60 mL/min (ref >60)
Glucose, Bld: 95 mg/dL (ref 65–99)
Potassium: 4 mmol/L (ref 3.5–5.1)
Sodium: 141 mmol/L (ref 135–145)
Total Bilirubin: 1 mg/dL (ref 0.3–1.2)
Total Protein: 6.7 g/dL (ref 6.5–8.1)

## 2014-09-26 LAB — SURGICAL PCR SCREEN
MRSA, PCR: NEGATIVE
Staphylococcus aureus: NEGATIVE

## 2014-09-26 NOTE — Progress Notes (Signed)
CXR epic 05/07/2014 EKG epic 05/07/2014 ECHO 11/18/2010 per epic  [name] LOV note per epic 02/2014

## 2014-09-26 NOTE — Patient Instructions (Signed)
Lee Nelson  09/26/2014   Your procedure is scheduled on: Friday September 29, 2014   Report [facility]  [place]tr[place]vators to 3rd floor to  Short Stay Center at 0730 AM.  Call this number if you have problems the morning of surgery [phone]   Remember: ONLY 1 PERSON MAY GO WITH YOU TO SHORT STAY TO GET  READY MORNING OF YOUR SURGERY.  Do not eat food or drink liquids :After Midnight.     Take these medicines the morning of surgery with A SIP OF WATER: Lansoprazole (Prevacid); Loratadine (Claritin); eye drops if needed bring day of surgery                               You may not have any metal on your body including hair pins and              piercings  Do not wear jewelry,  lotions, powders or colognes, deodorant                         Men may shave face and neck.   Do not bring valuables to the hospital. [facility] IS NOT             RESPONSIBLE   FOR VALUABLES.  Contacts, dentures or bridgework may not be worn into surgery.  Leave suitcase in the car. After surgery it may be brought to your room. _____________________________________________________________________             [facility] - Preparing for Surgery Before surgery, you can play an important role.  Because skin is not sterile, your skin needs to be as free of germs as possible.  You can reduce the number of germs on your skin by washing with CHG (chlorahexidine gluconate) soap before surgery.  CHG is an antiseptic cleaner which kills germs and bonds with the skin to continue killing germs even after washing. Please DO NOT use if you have an allergy to CHG or antibacterial soaps.  If your skin becomes reddened/irritated stop using the CHG and inform your nurse when you arrive at Short Stay. Do not shave (including legs and underarms) for at least 48 hours prior to the first CHG shower.  You may shave your face/neck. Please follow these instructions carefully:  1.  Shower  with CHG Soap the night before surgery and the  morning of Surgery.  2.  If you choose to wash your hair, wash your hair first as usual with your  normal  shampoo.  3.  After you shampoo, rinse your hair and body thoroughly to remove the  shampoo.                           4.  Use CHG as you would any other liquid soap.  You can apply chg directly  to the skin and wash                       Gently with a scrungie or clean washcloth.  5.  Apply the CHG Soap to your body ONLY FROM THE NECK DOWN.   Do not use on face/ open  Wound or open sores. Avoid contact with eyes, ears mouth and genitals (private parts).                       Wash face,  Genitals (private parts) with your normal soap.             6.  Wash thoroughly, paying special attention to the area where your surgery  will be performed.  7.  Thoroughly rinse your body with warm water from the neck down.  8.  DO NOT shower/wash with your normal soap after using and rinsing off  the CHG Soap.                9.  Pat yourself dry with a clean towel.            10.  Wear clean pajamas.            11.  Place clean sheets on your bed the night of your first shower and do not  sleep with pets. Day of Surgery : Do not apply any lotions/deodorants the morning of surgery.  Please wear clean clothes to the hospital/surgery center.  FAILURE TO FOLLOW THESE INSTRUCTIONS MAY RESULT IN THE CANCELLATION OF YOUR SURGERY PATIENT SIGNATURE_________________________________  NURSE SIGNATURE__________________________________  ________________________________________________________________________

## 2014-09-26 NOTE — Progress Notes (Addendum)
CBCD results in epic per PAT visit 09/26/2014 sent to Dr Raelyn Mora Newman

## 2014-09-29 ENCOUNTER — Ambulatory Visit (HOSPITAL_COMMUNITY): Payer: BLUE CROSS/BLUE SHIELD | Admitting: Anesthesiology

## 2014-09-29 ENCOUNTER — Ambulatory Visit (HOSPITAL_COMMUNITY): Payer: BLUE CROSS/BLUE SHIELD

## 2014-09-29 ENCOUNTER — Ambulatory Visit (HOSPITAL_COMMUNITY)
Admission: RE | Admit: 2014-09-29 | Discharge: 2014-09-29 | Disposition: A | Discharge status: Home / Self Care | Payer: BLUE CROSS/BLUE SHIELD | Source: Ambulatory Visit | Attending: Surgery | Admitting: Surgery

## 2014-09-29 ENCOUNTER — Encounter (HOSPITAL_COMMUNITY): Payer: Self-pay | Admitting: *Deleted

## 2014-09-29 ENCOUNTER — Encounter (HOSPITAL_COMMUNITY)
Admission: RE | Disposition: A | Discharge status: Home / Self Care | Payer: Self-pay | Source: Ambulatory Visit | Attending: Surgery

## 2014-09-29 DIAGNOSIS — Z79899 Other long term (current) drug therapy: Secondary | ICD-10-CM | POA: Insufficient documentation

## 2014-09-29 DIAGNOSIS — I341 Nonrheumatic mitral (valve) prolapse: Secondary | ICD-10-CM | POA: Insufficient documentation

## 2014-09-29 DIAGNOSIS — K8011 Calculus of gallbladder with chronic cholecystitis with obstruction: Secondary | ICD-10-CM | POA: Insufficient documentation

## 2014-09-29 DIAGNOSIS — K219 Gastro-esophageal reflux disease without esophagitis: Secondary | ICD-10-CM | POA: Insufficient documentation

## 2014-09-29 DIAGNOSIS — Z7982 Long term (current) use of aspirin: Secondary | ICD-10-CM | POA: Insufficient documentation

## 2014-09-29 DIAGNOSIS — K8020 Calculus of gallbladder without cholecystitis without obstruction: Secondary | ICD-10-CM

## 2014-09-29 HISTORY — PX: CHOLECYSTECTOMY: SHX55

## 2014-09-29 SURGERY — LAPAROSCOPIC CHOLECYSTECTOMY WITH INTRAOPERATIVE CHOLANGIOGRAM
Anesthesia: General

## 2014-09-29 MED ORDER — FENTANYL CITRATE (PF) 250 MCG/5ML IJ SOLN
INTRAMUSCULAR | Status: AC
  Filled 2014-09-29: qty 5

## 2014-09-29 MED ORDER — GLYCOPYRROLATE 0.2 MG/ML IJ SOLN
INTRAMUSCULAR | Status: DC | PRN
  Administered 2014-09-29: 0.4 mg via INTRAVENOUS

## 2014-09-29 MED ORDER — CEFAZOLIN SODIUM-DEXTROSE 2-3 GM-% IV SOLR
2.0000 g | INTRAVENOUS | Status: AC
  Administered 2014-09-29: 2 g via INTRAVENOUS

## 2014-09-29 MED ORDER — PROPOFOL 10 MG/ML IV BOLUS
INTRAVENOUS | Status: DC | PRN
  Administered 2014-09-29: 160 mg via INTRAVENOUS

## 2014-09-29 MED ORDER — MIDAZOLAM HCL 5 MG/5ML IJ SOLN
INTRAMUSCULAR | Status: DC | PRN
  Administered 2014-09-29: 2 mg via INTRAVENOUS

## 2014-09-29 MED ORDER — CISATRACURIUM BESYLATE 20 MG/10ML IV SOLN
INTRAVENOUS | Status: AC
  Filled 2014-09-29: qty 10

## 2014-09-29 MED ORDER — ACETAMINOPHEN 10 MG/ML IV SOLN
1000.0000 mg | Freq: Once | INTRAVENOUS | Status: AC
  Administered 2014-09-29: 1000 mg via INTRAVENOUS

## 2014-09-29 MED ORDER — ACETAMINOPHEN 10 MG/ML IV SOLN
INTRAVENOUS | Status: AC
  Administered 2014-09-29: 1000 mg via INTRAVENOUS
  Filled 2014-09-29: qty 100

## 2014-09-29 MED ORDER — CEFAZOLIN SODIUM-DEXTROSE 2-3 GM-% IV SOLR
INTRAVENOUS | Status: AC
  Filled 2014-09-29: qty 50

## 2014-09-29 MED ORDER — CHLORHEXIDINE GLUCONATE 4 % EX LIQD: 1.0000 "application " | Freq: Once | CUTANEOUS | Status: DC

## 2014-09-29 MED ORDER — DEXAMETHASONE SODIUM PHOSPHATE 10 MG/ML IJ SOLN
INTRAMUSCULAR | Status: AC
  Filled 2014-09-29: qty 1

## 2014-09-29 MED ORDER — MIDAZOLAM HCL 2 MG/2ML IJ SOLN
INTRAMUSCULAR | Status: AC
  Filled 2014-09-29: qty 2

## 2014-09-29 MED ORDER — NEOSTIGMINE METHYLSULFATE 10 MG/10ML IV SOLN
INTRAVENOUS | Status: AC
  Filled 2014-09-29: qty 1

## 2014-09-29 MED ORDER — HYDROMORPHONE HCL 1 MG/ML IJ SOLN: 0.2500 mg | INTRAMUSCULAR | Status: DC | PRN

## 2014-09-29 MED ORDER — GLYCOPYRROLATE 0.2 MG/ML IJ SOLN
INTRAMUSCULAR | Status: AC
  Filled 2014-09-29: qty 2

## 2014-09-29 MED ORDER — LACTATED RINGERS IV SOLN
INTRAVENOUS | Status: DC | PRN
  Administered 2014-09-29: 1000 mL

## 2014-09-29 MED ORDER — BUPIVACAINE HCL (PF) 0.25 % IJ SOLN
INTRAMUSCULAR | Status: AC
  Filled 2014-09-29: qty 30

## 2014-09-29 MED ORDER — FENTANYL CITRATE (PF) 100 MCG/2ML IJ SOLN
25.0000 ug | INTRAMUSCULAR | Status: DC | PRN
Start: 2014-09-29 — End: 2014-09-29
  Administered 2014-09-29 (×4): 25 ug via INTRAVENOUS

## 2014-09-29 MED ORDER — ACETAMINOPHEN-CODEINE #3 300-30 MG PO TABS: 1.0000 | ORAL_TABLET | Freq: Four times a day (QID) | ORAL | Status: DC | PRN

## 2014-09-29 MED ORDER — FENTANYL CITRATE (PF) 100 MCG/2ML IJ SOLN
INTRAMUSCULAR | Status: AC
  Administered 2014-09-29: 25 ug via INTRAVENOUS
  Filled 2014-09-29: qty 2

## 2014-09-29 MED ORDER — BUPIVACAINE HCL (PF) 0.25 % IJ SOLN
INTRAMUSCULAR | Status: DC | PRN
  Administered 2014-09-29: 30 mL

## 2014-09-29 MED ORDER — FENTANYL CITRATE (PF) 250 MCG/5ML IJ SOLN
INTRAMUSCULAR | Status: DC | PRN
  Administered 2014-09-29: 50 ug via INTRAVENOUS
  Administered 2014-09-29 (×2): 100 ug via INTRAVENOUS

## 2014-09-29 MED ORDER — LIDOCAINE HCL 1 % IJ SOLN
INTRAMUSCULAR | Status: DC | PRN
  Administered 2014-09-29: 80 mg via INTRADERMAL

## 2014-09-29 MED ORDER — DEXAMETHASONE SODIUM PHOSPHATE 10 MG/ML IJ SOLN
INTRAMUSCULAR | Status: DC | PRN
  Administered 2014-09-29: 10 mg via INTRAVENOUS

## 2014-09-29 MED ORDER — LACTATED RINGERS IV SOLN
INTRAVENOUS | Status: DC
  Administered 2014-09-29: 10:00:00 via INTRAVENOUS
  Administered 2014-09-29: 1000 mL via INTRAVENOUS

## 2014-09-29 MED ORDER — LABETALOL HCL 5 MG/ML IV SOLN
INTRAVENOUS | Status: DC | PRN
  Administered 2014-09-29 (×2): 5 mg via INTRAVENOUS

## 2014-09-29 MED ORDER — KETOROLAC TROMETHAMINE 30 MG/ML IJ SOLN
30.0000 mg | Freq: Once | INTRAMUSCULAR | Status: DC | PRN
Start: 2014-09-29 — End: 2014-09-29

## 2014-09-29 MED ORDER — SUCCINYLCHOLINE CHLORIDE 20 MG/ML IJ SOLN
INTRAMUSCULAR | Status: DC | PRN
  Administered 2014-09-29: 100 mg via INTRAVENOUS

## 2014-09-29 MED ORDER — LIDOCAINE HCL (CARDIAC) 20 MG/ML IV SOLN
INTRAVENOUS | Status: AC
  Filled 2014-09-29: qty 5

## 2014-09-29 MED ORDER — IOHEXOL 300 MG/ML  SOLN
INTRAMUSCULAR | Status: DC | PRN
  Administered 2014-09-29: 5 mL via INTRAVENOUS

## 2014-09-29 MED ORDER — PROMETHAZINE HCL 25 MG/ML IJ SOLN: 6.2500 mg | INTRAMUSCULAR | Status: DC | PRN

## 2014-09-29 MED ORDER — NEOSTIGMINE METHYLSULFATE 10 MG/10ML IV SOLN
INTRAVENOUS | Status: DC | PRN
  Administered 2014-09-29: 4 mg via INTRAVENOUS

## 2014-09-29 MED ORDER — ONDANSETRON HCL 4 MG/2ML IJ SOLN
INTRAMUSCULAR | Status: AC
  Filled 2014-09-29: qty 2

## 2014-09-29 MED ORDER — PROPOFOL 10 MG/ML IV BOLUS
INTRAVENOUS | Status: AC
  Filled 2014-09-29: qty 20

## 2014-09-29 MED ORDER — 0.9 % SODIUM CHLORIDE (POUR BTL) OPTIME
TOPICAL | Status: DC | PRN
  Administered 2014-09-29: 1000 mL

## 2014-09-29 MED ORDER — CISATRACURIUM BESYLATE (PF) 10 MG/5ML IV SOLN
INTRAVENOUS | Status: DC | PRN
  Administered 2014-09-29: 6 mg via INTRAVENOUS

## 2014-09-29 MED ORDER — LABETALOL HCL 5 MG/ML IV SOLN
INTRAVENOUS | Status: AC
  Filled 2014-09-29: qty 4

## 2014-09-29 SURGICAL SUPPLY — 34 items
APL SKNCLS STERI-STRIP NONHPOA (GAUZE/BANDAGES/DRESSINGS) ×1
APPLIER CLIP ROT 10 11.4 M/L (STAPLE) ×2
APR CLP MED LRG 11.4X10 (STAPLE) ×1
BAG SPEC RTRVL LRG 6X4 10 (ENDOMECHANICALS)
BENZOIN TINCTURE PRP APPL 2/3 (GAUZE/BANDAGES/DRESSINGS) ×2 IMPLANT
CHLORAPREP W/TINT 26ML (MISCELLANEOUS) ×2 IMPLANT
CHOLANGIOGRAM CATH TAUT (CATHETERS) ×2 IMPLANT
CLIP APPLIE ROT 10 11.4 M/L (STAPLE) ×1 IMPLANT
COVER MAYO STAND STRL (DRAPES) IMPLANT
DECANTER SPIKE VIAL GLASS SM (MISCELLANEOUS) ×2 IMPLANT
DRAPE C-ARM 42X120 X-RAY (DRAPES) IMPLANT
DRAPE LAPAROSCOPIC ABDOMINAL (DRAPES) ×2 IMPLANT
ELECT REM PT RETURN 9FT ADLT (ELECTROSURGICAL) ×2
ELECTRODE REM PT RTRN 9FT ADLT (ELECTROSURGICAL) ×1 IMPLANT
GLOVE SURG SIGNA 7.5 PF LTX (GLOVE) ×2 IMPLANT
GOWN STRL REUS W/TWL XL LVL3 (GOWN DISPOSABLE) ×6 IMPLANT
HEMOSTAT SURGICEL 4X8 (HEMOSTASIS) IMPLANT
IV CATH 14GX2 1/4 (CATHETERS) ×2 IMPLANT
IV SET EXTENSION CATH 6 NF (IV SETS) ×2 IMPLANT
KIT BASIN OR (CUSTOM PROCEDURE TRAY) ×2 IMPLANT
LIQUID BAND (GAUZE/BANDAGES/DRESSINGS) ×2 IMPLANT
PENCIL BUTTON HOLSTER BLD 10FT (ELECTRODE) ×4 IMPLANT
POUCH SPECIMEN RETRIEVAL 10MM (ENDOMECHANICALS) IMPLANT
SET IRRIG TUBING LAPAROSCOPIC (IRRIGATION / IRRIGATOR) ×2 IMPLANT
SLEEVE XCEL OPT CAN 5 100 (ENDOMECHANICALS) ×2 IMPLANT
STOPCOCK 4 WAY LG BORE MALE ST (IV SETS) ×2 IMPLANT
STRIP CLOSURE SKIN 1/4X4 (GAUZE/BANDAGES/DRESSINGS) ×2 IMPLANT
SUT VIC AB 5-0 PS2 18 (SUTURE) ×2 IMPLANT
SUT VICRYL 0 UR6 27IN ABS (SUTURE) ×2 IMPLANT
TOWEL OR 17X26 10 PK STRL BLUE (TOWEL DISPOSABLE) ×2 IMPLANT
TRAY LAPAROSCOPIC (CUSTOM PROCEDURE TRAY) ×2 IMPLANT
TROCAR BLADELESS OPT 5 100 (ENDOMECHANICALS) ×2 IMPLANT
TROCAR XCEL BLUNT TIP 100MML (ENDOMECHANICALS) ×2 IMPLANT
TROCAR XCEL NON-BLD 11X100MML (ENDOMECHANICALS) IMPLANT

## 2014-09-29 NOTE — Anesthesia Preprocedure Evaluation (Addendum)
Anesthesia Evaluation  Patient identified by MRN, date of birth, ID band Patient awake    Reviewed: Allergy & Precautions, NPO status , Patient's Chart, lab work & pertinent test results  History of Anesthesia Complications (+) PONV  Airway Mallampati: II  TM Distance: >3 FB Neck ROM: Full    Dental no notable dental hx.    Pulmonary neg pulmonary ROS, shortness of breath, pneumonia -,  breath sounds clear to auscultation  Pulmonary exam normal       Cardiovascular + angina negative cardio ROS Normal cardiovascular examRhythm:Regular Rate:Normal     Neuro/Psych negative neurological ROS  negative psych ROS   GI/Hepatic Neg liver ROS, GERD-  ,  Endo/Other  negative endocrine ROS  Renal/GU negative Renal ROS  negative genitourinary   Musculoskeletal negative musculoskeletal ROS (+)   Abdominal   Peds negative pediatric ROS (+)  Hematology negative hematology ROS (+)   Anesthesia Other Findings   Reproductive/Obstetrics negative OB ROS                            Anesthesia Physical Anesthesia Plan  ASA: II  Anesthesia Plan: General   Post-op Pain Management:    Induction: Intravenous  Airway Management Planned: Oral ETT  Additional Equipment:   Intra-op Plan:   Post-operative Plan: Extubation in OR  Informed Consent: I have reviewed the patients History and Physical, chart, labs and discussed the procedure including the risks, benefits and alternatives for the proposed anesthesia with the patient or authorized representative who has indicated his/her understanding and acceptance.   Dental advisory given  Plan Discussed with: CRNA, Surgeon and Anesthesiologist  Anesthesia Plan Comments:        Anesthesia Quick Evaluation

## 2014-09-29 NOTE — H&P (Signed)
Lee GlanceMichael K. Weirauch 05/31/2014 9:20 AM Location: [facility] Patient #: [id number] DOB: 05-23-1940 Married / Language: English / Race: White Male  History of Present Illness  Patient words: gallbladder.   The patient is a 74 year old male who presents for evaluation of gall stones. This PCP is Dr. Felipa EthAvva. He comes by himself.   I received a note from Dr. Darnelle CatalanMagrinat that he has given him my name.   The patient had two attacks of abdominal pain close together. The first lasted just a little while. The second attack [facility]. He had pain, some nausea. Then had a fever for a day or two after the attack. Her pointed out that the pain that he has had is in the LUQ. He had an US of his abdomen on 05/08/2014 that showed gallstones and a left hepatic cyst. He is here to talk aobut his gall bladder and his abdominal pain.   The patient has had significant reflux since the 1980s. He had an open N[facility] fundoplication at [facility] in [facility]. He said it really did not work. He did have an upper endoscopy by Dr. Dulce Sellarutlaw on 09 December 2013 that showed no obvious abnormality. Dr. Dulce Sellarutlaw did not comment on the appearance of a prior Nissen fundoplication.   I discussed with the patient the indications and risks of gall bladder surgery. The primary risks of gall bladder surgery include, but are not limited to, bleeding, infection, common bile duct injury, and open surgery. There is also the risk that the patient may have continued symptoms after surgery. However, the likelihood of improvement in symptoms and return to the patient's normal status is good. We discussed the typical post-operative recovery course. I tried to answer the patient's questions.  I gave the patient literature about gall bladder surgery.  At this time, he does not want to have surgery. We talked about Actigall to dissolve gall stones. At this time, he will watch his diet to see how he does. He has  upcoming appts with Dr. Dulce Sellarutlaw and Dr. Felipa EthAvva and will discuss the findings wtih them.  Past Medical History: 1. [facility] - [facility] 2. History of nephrolithiasis last lithotripsy was 10 years ago. 3. GERD 4. Osteoporosis 5. MVP - he saw someone in R. Weintraub's office, but he does not remmber the name. He is not chronically followed. 6. He had some constipaiton that resovled about 6 months ago. 7. Back and joint issues - he has neck, back and disk issues.   Sees Dr. August Saucerean, but no plans for surgery. 8. He saw Dr. Marjory LiesPenumalli for arm numbness and double vision, but he is not actively being followed by him. 9. Dr. Laural BenesJohnson did his last colonoscopy  Social History:  Married. His wife had a bad horse accident in 2014 - she is now an incomplete quadraplegic He teaches law at [employer]   Other Problems Gilmer Mor(Sonya Bynum, CMA; 05/31/2014 9:20 AM) Back Pain Bladder Problems Chest pain Cholelithiasis Enlarged Prostate Gastroesophageal Reflux Disease Inguinal Hernia Kidney Stone Melanoma Other disease, cancer, significant illness  Past Surgical History Gilmer Mor(Sonya Bynum, CMA; 05/31/2014 9:20 AM) Appendectomy Colon Polyp Removal - Colonoscopy Hemorrhoidectomy Nissen Fundoplication Open Inguinal Hernia Surgery Right. Oral Surgery Tonsillectomy  Diagnostic Studies History Gilmer Mor(Sonya Bynum, CMA; 05/31/2014 9:20 AM) Colonoscopy 1-5 years ago  Allergies (Sonya Bynum, CMA; 05/31/2014 9:22 AM) Metoclopramide HCl *GASTROINTESTINAL AGENTS - MISC.* Metronidazole (Topical) *DERMATOLOGICALS* Sulfamethoxazole-Trimethoprim *ANTI-INFECTIVE AGENTS - MISC.*  Medication History Gilmer Mor(Sonya Bynum, CMA; 05/31/2014  9:26 AM) Xanax (0.25MG  Tablet, Oral) Active. Aspirin EC (  Tablet DR, Oral) Active. Vitamin D (1000UNIT Capsule, Oral) Active. Allegra Allergy (  Tablet, Oral) Active. Lansoprazole (  Capsule DR, Oral) Active. Melatonin (  Capsule, Oral) Active. Milk  Thistle (  Capsule, Oral) Active. Multivitamins (Oral) Active. Vitamin C (  Tablet, Oral) Active. Sonata (  Capsule, Oral) Active. Cranberry (  Capsule, Oral) Active. Medications Reconciled  Social History Gilmer Mor, CMA; 05/31/2014 9:20 AM) Alcohol use Occasional alcohol use. Caffeine use Tea. No drug use Tobacco use Never smoker.  Family History Gilmer Mor, CMA; 05/31/2014 9:20 AM) Arthritis Family Members In General. Heart Disease Mother. Malignant Neoplasm Of Pancreas Family Members In General. Respiratory Condition Father, Mother.  Review of Systems Lamar Laundry Bynum CMA; 05/31/2014 9:20 AM) General Present- Appetite Loss, Fatigue and Weight Loss. Not Present- Chills, Fever, Night Sweats and Weight Gain. Skin Present- Dryness. Not Present- Change in Wart/Mole, Hives, Jaundice, New Lesions, Non-Healing Wounds, Rash and Ulcer. HEENT Present- Hearing Loss, Hoarseness, Seasonal Allergies, Sinus Pain, Sore Throat and Wears glasses/contact lenses. Not Present- Earache, Nose Bleed, Oral Ulcers, Ringing in the Ears, Visual Disturbances and Yellow Eyes. Gastrointestinal Present- Bloating, Change in Bowel Habits, Difficulty Swallowing, Excessive gas and Hemorrhoids. Not Present- Abdominal Pain, Bloody Stool, Chronic diarrhea, Constipation, Gets full quickly at meals, Indigestion, Nausea, Rectal Pain and Vomiting. Male Genitourinary Present- Change in Urinary Stream, Frequency, Nocturia, Urgency and Urine Leakage. Not Present- Blood in Urine, Impotence and Painful Urination. Musculoskeletal Present- Back Pain, Joint Stiffness and Muscle Pain. Not Present- Joint Pain, Muscle Weakness and Swelling of Extremities. Neurological Present- Tingling. Not Present- Decreased Memory, Fainting, Headaches, Numbness, Seizures, Tremor, Trouble walking and Weakness. Psychiatric Present- Anxiety and Change in Sleep Pattern. Not Present- Bipolar, Depression, Fearful and Frequent  crying. Endocrine Present- Cold Intolerance. Not Present- Excessive Hunger, Hair Changes, Heat Intolerance, Hot flashes and New Diabetes. Hematology Not Present- Easy Bruising, Excessive bleeding, Gland problems, HIV and Persistent Infections.   Vitals (Sonya Bynum CMA; 05/31/2014 9:21 AM) 05/31/2014 9:21 AM Weight: 127 lb Height: 73in Body Surface Area: 1.72 m Body Mass Index: 16.76 kg/m Temp.: 97.28F(Temporal)  Pulse: 61 (Regular)  BP: 136/80 (Sitting, Left Arm, Standard)  Physical Exam  General: Very thin WM who is alert and generally healthy appearing. HEENT: Normal. Pupils equal. Good dentition.  Neck: Supple. No mass. No thyroid mass. Lymph Nodes: No supraclavicular or cervical nodes.  Lungs: Clear to auscultation and symmetric breath sounds. Heart: RRR. No murmur or rub.  Abdomen: Soft. No mass. No tenderness. No hernia. Normal bowel sounds. He has a well healed mid line scar. He has a RLQ scar from an appendectomy and Lef inguinal scar from an inguinal hernia.  Extremities: Good strength and ROM in upper and lower extremities.  Neurologic: Grossly intact to motor and sensory function.  Assessment & Plan  1.  GALL STONES (574.20  K80.20)  He has decided to go ahead with gall bladder surgery.  The indication and risks have been discussed with the patient.  2.  History of open Nissen and continued GERD  Ovidio Kin, MD, [facility] Pager: [phone] Office phone:  [phone]

## 2014-09-29 NOTE — Progress Notes (Addendum)
Patient up to ambulate entire length of hallway of [address] after laparoscopic cholecystectomy. Tolerated well. Patient is very concerned about his difficult intubation and would like anesthesia to call him to discuss this. RN called Dr Okey Dupreose and asked him to call patient about this issue.

## 2014-09-29 NOTE — Anesthesia Postprocedure Evaluation (Addendum)
  Anesthesia Post-op Note  Patient: Lee Nelson  Procedure(s) Performed: Procedure(s) (LRB): LAPAROSCOPIC CHOLECYSTECTOMY WITH INTRAOPERATIVE CHOLANGIOGRAM (N/A)  Patient Location: PACU  Anesthesia Type: General  Level of Consciousness: awake and alert   Airway and Oxygen Therapy: Patient Spontanous Breathing  Post-op Pain: mild  Post-op Assessment: Post-op Vital signs reviewed, Patient's Cardiovascular Status Stable, Respiratory Function Stable, Patent Airway and No signs of Nausea or vomiting  Last Vitals:  Filed Vitals:   09/29/14 1214  BP: 122/55  Pulse: 58  Temp: 36.3 C  Resp: 14    Post-op Vital Signs: stable   Complications: No apparent anesthesia complications.  Patient is a difficult intubation. Will need glidescope for future procedures

## 2014-09-29 NOTE — Discharge Instructions (Signed)
[facility] - DISCHARGE INSTRUCTIONS TO PATIENT  Activity:  Driving - May drive in 3 to 5 days, if doing well and off pain meds   Lifting - Take it easy (< 15 pounds) for 3 weeks.  Wound Care:   Leave bandages on until Sunday, then may remove bandages and shower.  Diet:  As tolerated.  Be careful with fatty food.  Follow up appointment:  Call Dr. Allene PyoNewman's office [facility] at [phone] for an appointment in 2 to 4 weeds.  Medications and dosages:  Resume your home medications.  You have a prescription for:  Tylenol #3  Call Dr. Ezzard StandingNewman or his office  [phone]) if you have:  Temperature greater than 100.4,  Persistent nausea and vomiting,  Severe uncontrolled pain,  Redness, tenderness, or signs of infection (pain, swelling, redness, odor or green/yellow discharge around the site),  Difficulty breathing, headache or visual disturbances,  Any other questions or concerns you may have after discharge.  In an emergency, call 911 or go to an Emergency Department at a nearby hospital.     General Anesthesia, Care After Refer to this sheet in the next few weeks. These instructions provide you with information on caring for yourself after your procedure. Your health care provider may also give you more specific instructions. Your treatment has been planned according to current medical practices, but problems sometimes occur. Call your health care provider if you have any problems or questions after your procedure. WHAT TO EXPECT AFTER THE PROCEDURE After the procedure, it is typical to experience:  Sleepiness.  Nausea and vomiting. HOME CARE INSTRUCTIONS  For the first 24 hours after general anesthesia:  Have a responsible person with you.  Do not drive a car. If you are alone, do not take public transportation.  Do not drink alcohol.  Do not take medicine that has not been prescribed by your health care provider.  Do not sign important papers  or make important decisions.  You may resume a normal diet and activities as directed by your health care provider.  Change bandages (dressings) as directed.  If you have questions or problems that seem related to general anesthesia, call the hospital and ask for the anesthetist or anesthesiologist on call. SEEK MEDICAL CARE IF:  You have nausea and vomiting that continue the day after anesthesia.  You develop a rash. SEEK IMMEDIATE MEDICAL CARE IF:   You have difficulty breathing.  You have chest pain.  You have any allergic problems. Document Released: 06/23/2000 Document Revised: 03/22/2013 Document Reviewed: 09/30/2012 [facility] Patient Information 2015 [employer] [place]. This information is not intended to replace advice given to you by your health care provider. Make sure you discuss any questions you have with your health care provider.   You had a difficult intubation. Make sure to let any anesthesiologist know this.

## 2014-09-29 NOTE — Op Note (Signed)
09/29/2014  11:06 AM  PATIENT:  Lee Nelson, 74 y.o., male, MRN: [id number]  PREOP DIAGNOSIS:  gall bladder disease  POSTOP DIAGNOSIS:   Chronic cholecystitis, cholelithiasis, bilirubinate stones impacted in cystic duct  PROCEDURE:   Procedure(s): LAPAROSCOPIC CHOLECYSTECTOMY WITH INTRAOPERATIVE CHOLANGIOGRAM  SURGEON:   Ovidio Kin, M.D.  Threasa HeadsFredonia Highland, M.D.  ANESTHESIA:   general  Anesthesiologist: Eilene Ghazi, MD CRNA: Hulan Fess, CRNA  General  ASA: 2  EBL:  miniml  ml  BLOOD ADMINISTERED: none  DRAINS: none   LOCAL MEDICATIONS USED:   30 cc 1/4% marcaine  SPECIMEN:   Gall bladder  COUNTS CORRECT:  YES  INDICATIONS FOR PROCEDURE:  Lee Nelson is a 74 y.o. (DOB: 03/18/1941) white  male whose primary care physician is Hoyle Sauer, MD and comes for cholecystectomy.   The indications and risks of the gall bladder surgery were explained to the patient.  The risks include, but are not limited to, infection, bleeding, common bile duct injury and open surgery.  SURGERY:  The patient was taken to room #1 at [facility].  The abdomen was prepped with chloroprep.  The patient was given 2 gm Ancef at the beginning of the operation.   A time out was held and the surgical checklist run.   An infraumbilical incision was made into the abdominal cavity.  A 12 mm Hasson trocar was inserted into the abdominal cavity through the infraumbilical incision and secured with a 0 Vicryl suture.  Three additional trocars were inserted: a 5 mm trocar in the sub-xiphoid location, a 5 mm trocar in the right mid subcostal area, and a 5 mm trocar in the right lateral subcostal area.   The abdomen was explored.  There were multiple adhesions from his prior Nissen fundoplication.  The right lobe of the liver, stomach, and bowel that could be seen were unremarkable.  Adhesions obscured the left lobe of the liver and the left side of his abdomen.  I took down adhesions around  the gall bladder.  There were adhesions between the gall bladder and duodenum.   The gall bladder was grasped, and rotated cephalad.  Disssection was carried down to the gall bladder/cystic duct junction and the cystic duct isolated.  A clip was placed on the gall bladder side of the cystic duct.   When I cut the cystic duct, I milked out about 5 black gall stones (bilirubinate) out of the cystic duct.  A photo was taken and placed in the chart.  An intra-operative cholangiogram was shot.   The intra-operative cholangiogram was shot using a cut off Taut catheter placed through a 14 gauge angiocath in the RUQ.  The Taut catheter was inserted in the cut cystic duct and secured with an endoclip.  A cholangiogram was shot with 10 cc of 1/2 strength Omnipaque.  Using fluoroscopy, the cholangiogram showed the flow of contrast into the common bile duct, up the hepatic radicals, and into the duodenum.  There was no mass or obstruction.  This was a normal intra-operative cholangiogram.   The Taut catheter was removed.  The cystic duct was tripley endoclipped and the cystic artery was identified and clipped.  The gall bladder was bluntly and sharpley dissected from the gall bladder bed.   After the gall bladder was removed from the liver, the gall bladder bed and [place] were inspected.  There was no bleeding or bile leak.  The gall bladder was placed in a endocatch  bag and delivered through the umbilicus.  The abdomen was irrigated with 1,800 cc saline.   The trocars were then removed.  I infiltrated 30 cc of 1/4% Marcaine into the incisions.  The umbilical port closed with a 0 Vicryl suture and the skin closed with 5-0 Vicryl.  The skin was painted with LiquiBand.  The patient's sponge and needle count were correct.  The patient was transported to the RR in good condition.   I plan to let him go home today.  [name], MD, [facility] Pager: [phone] Office phone:   [phone]

## 2014-09-29 NOTE — Transfer of Care (Signed)
Immediate Anesthesia Transfer of Care Note  Patient: Lee Nelson  Procedure(s) Performed: Procedure(s): LAPAROSCOPIC CHOLECYSTECTOMY WITH INTRAOPERATIVE CHOLANGIOGRAM (N/A)  Patient Location: PACU  Anesthesia Type:General  Level of Consciousness: awake, alert  and oriented  Airway & Oxygen Therapy: Patient Spontanous Breathing and Patient connected to face mask oxygen  Post-op Assessment: Report given to RN  Post vital signs: Reviewed and stable  Last Vitals:  Filed Vitals:   09/29/14 0709  BP: 97/52  Pulse: 64  Temp: 36.8 C  Resp: 18    Complications: No apparent anesthesia complications

## 2014-10-03 ENCOUNTER — Encounter (HOSPITAL_COMMUNITY): Payer: Self-pay | Admitting: Surgery

## 2014-11-20 ENCOUNTER — Emergency Department (HOSPITAL_COMMUNITY)
Admission: EM | Admit: 2014-11-20 | Discharge: 2014-11-21 | Disposition: A | Discharge status: Home / Self Care | Payer: BLUE CROSS/BLUE SHIELD | Attending: Emergency Medicine | Admitting: Emergency Medicine

## 2014-11-20 ENCOUNTER — Encounter (HOSPITAL_COMMUNITY): Payer: Self-pay | Admitting: Vascular Surgery

## 2014-11-20 DIAGNOSIS — Z85828 Personal history of other malignant neoplasm of skin: Secondary | ICD-10-CM | POA: Diagnosis not present

## 2014-11-20 DIAGNOSIS — M62838 Other muscle spasm: Secondary | ICD-10-CM | POA: Insufficient documentation

## 2014-11-20 DIAGNOSIS — Z7982 Long term (current) use of aspirin: Secondary | ICD-10-CM | POA: Diagnosis not present

## 2014-11-20 DIAGNOSIS — K219 Gastro-esophageal reflux disease without esophagitis: Secondary | ICD-10-CM | POA: Diagnosis not present

## 2014-11-20 DIAGNOSIS — M542 Cervicalgia: Secondary | ICD-10-CM | POA: Diagnosis present

## 2014-11-20 DIAGNOSIS — Z8701 Personal history of pneumonia (recurrent): Secondary | ICD-10-CM | POA: Insufficient documentation

## 2014-11-20 DIAGNOSIS — F419 Anxiety disorder, unspecified: Secondary | ICD-10-CM | POA: Insufficient documentation

## 2014-11-20 DIAGNOSIS — Z79899 Other long term (current) drug therapy: Secondary | ICD-10-CM | POA: Insufficient documentation

## 2014-11-20 DIAGNOSIS — I209 Angina pectoris, unspecified: Secondary | ICD-10-CM | POA: Insufficient documentation

## 2014-11-20 DIAGNOSIS — Z8614 Personal history of Methicillin resistant Staphylococcus aureus infection: Secondary | ICD-10-CM | POA: Insufficient documentation

## 2014-11-20 DIAGNOSIS — R51 Headache: Secondary | ICD-10-CM | POA: Diagnosis not present

## 2014-11-20 DIAGNOSIS — Z862 Personal history of diseases of the blood and blood-forming organs and certain disorders involving the immune mechanism: Secondary | ICD-10-CM | POA: Diagnosis not present

## 2014-11-20 DIAGNOSIS — Z87442 Personal history of urinary calculi: Secondary | ICD-10-CM | POA: Diagnosis not present

## 2014-11-20 DIAGNOSIS — Z8611 Personal history of tuberculosis: Secondary | ICD-10-CM | POA: Diagnosis not present

## 2014-11-20 LAB — URINALYSIS, ROUTINE W REFLEX MICROSCOPIC
Bilirubin Urine: NEGATIVE
Glucose, UA: NEGATIVE mg/dL
Hgb urine dipstick: NEGATIVE
Ketones, ur: NEGATIVE mg/dL
Leukocytes, UA: NEGATIVE
Nitrite: NEGATIVE
Protein, ur: NEGATIVE mg/dL
Specific Gravity, Urine: 1.008 (ref 1.005–1.030)
Urobilinogen, UA: 0.2 mg/dL (ref 0.0–1.0)
pH: 5 (ref 5.0–8.0)

## 2014-11-20 LAB — COMPREHENSIVE METABOLIC PANEL
ALT: 16 U/L — ABNORMAL LOW (ref 17–63)
AST: 23 U/L (ref 15–41)
Albumin: 3.9 g/dL (ref 3.5–5.0)
Alkaline Phosphatase: 82 U/L (ref 38–126)
Anion gap: 8 (ref 5–15)
BUN: 11 mg/dL (ref 6–20)
CO2: 25 mmol/L (ref 22–32)
Calcium: 9.4 mg/dL (ref 8.9–10.3)
Chloride: 106 mmol/L (ref 101–111)
Creatinine, Ser: 0.77 mg/dL (ref 0.61–1.24)
GFR calc Af Amer: >60 mL/min (ref >60)
GFR calc non Af Amer: >60 mL/min (ref >60)
Glucose, Bld: 115 mg/dL — ABNORMAL HIGH (ref 65–99)
Potassium: 4.1 mmol/L (ref 3.5–5.1)
Sodium: 139 mmol/L (ref 135–145)
Total Bilirubin: 0.4 mg/dL (ref 0.3–1.2)
Total Protein: 6.3 g/dL — ABNORMAL LOW (ref 6.5–8.1)

## 2014-11-20 LAB — CBC WITH DIFFERENTIAL/PLATELET
Basophils Absolute: 0 10*3/uL (ref 0.0–0.1)
Basophils Relative: 1 % (ref 0–1)
Eosinophils Absolute: 0.2 10*3/uL (ref 0.0–0.7)
Eosinophils Relative: 3 % (ref 0–5)
HCT: 39.3 % (ref 39.0–52.0)
Hemoglobin: 13.5 g/dL (ref 13.0–17.0)
Lymphocytes Relative: 16 % (ref 12–46)
Lymphs Abs: 1 10*3/uL (ref 0.7–4.0)
MCH: 32.1 pg (ref 26.0–34.0)
MCHC: 34.4 g/dL (ref 30.0–36.0)
MCV: 93.3 fL (ref 78.0–100.0)
Monocytes Absolute: 0.7 10*3/uL (ref 0.1–1.0)
Monocytes Relative: 11 % (ref 3–12)
Neutro Abs: 4.5 10*3/uL (ref 1.7–7.7)
Neutrophils Relative %: 69 % (ref 43–77)
Platelets: 123 10*3/uL — ABNORMAL LOW (ref 150–400)
RBC: 4.21 MIL/uL — ABNORMAL LOW (ref 4.22–5.81)
RDW: 13.1 % (ref 11.5–15.5)
WBC: 6.5 10*3/uL (ref 4.0–10.5)

## 2014-11-20 LAB — I-STAT CG4 LACTIC ACID, ED: Lactic Acid, Venous: 0.5 mmol/L (ref 0.5–2.0)

## 2014-11-20 NOTE — ED Notes (Signed)
Pt reports to the ED for eval of neck stiffness and a fever today. He also reports a right sided HA. Pt reports he had an X-ray performed on 8/21 at the [facility] and was told he just had cervical strain. He reports his fever a little while ago was 100. Pt reports his neck stiffness started on Friday after he had some skin biopsies performed. Pt reports he had some numbness and tingling in his right arm and hand but denies any at this time. Pt has hx of ruptured disc in his neck. Pt A&Ox4, resp e/u, and skin warm and dry.

## 2014-11-21 MED ORDER — CYCLOBENZAPRINE HCL 10 MG PO TABS: 10.0000 mg | ORAL_TABLET | Freq: Every day | ORAL | Status: DC

## 2014-11-21 NOTE — Discharge Instructions (Signed)
You have neck pain, possibly from a cervical strain and/or pinched nerve.   SEEK IMMEDIATE MEDICAL ATTENTION IF: You develop difficulties swallowing or breathing.  You have new or worse numbness, weakness, tingling, or movement problems in your arms or legs.  You develop increasing pain which is uncontrolled with medications.  You have change in bowel or bladder function, or other concerns.   Heat Therapy Heat therapy can help make painful, stiff muscles and joints feel better. Do not use heat on new injuries. Wait at least 48 hours after an injury to use heat. Do not use heat when you have aches or pains right after an activity. If you still have pain 3 hours after stopping the activity, then you may use heat. HOME CARE Wet heat pack  Soak a clean towel in warm water. Squeeze out the extra water.  Put the warm, wet towel in a plastic bag.  Place a thin, dry towel between your skin and the bag.  Put the heat pack on the area for 5 minutes, and check your skin. Your skin may be pink, but it should not be red.  Leave the heat pack on the area for 15 to 30 minutes.  Repeat this every 2 to 4 hours while awake. Do not use heat while you are sleeping. Warm water bath  Fill a tub with warm water.  Place the affected body part in the tub.  Soak the area for 20 to 40 minutes.  Repeat as needed. Hot water bottle  Fill the water bottle half full with hot water.  Press out the extra air. Close the cap tightly.  Place a dry towel between your skin and the bottle.  Put the bottle on the area for 5 minutes, and check your skin. Your skin may be pink, but it should not be red.  Leave the bottle on the area for 15 to 30 minutes.  Repeat this every 2 to 4 hours while awake. Electric heating pad  Place a dry towel between your skin and the heating pad.  Set the heating pad on low heat.  Put the heating pad on the area for 10 minutes, and check your skin. Your skin may be pink, but  it should not be red.  Leave the heating pad on the area for 20 to 40 minutes.  Repeat this every 2 to 4 hours while awake.  Do not lie on the heating pad.  Do not fall asleep while using the heating pad.  Do not use the heating pad near water. GET HELP RIGHT AWAY IF:  You get blisters or red skin.  Your skin is puffy (swollen), or you lose feeling (numbness) in the affected area.  You have any new problems.  Your problems are getting worse.  You have any questions or concerns. If you have any problems, stop using heat therapy until you see your doctor. MAKE SURE YOU:  Understand these instructions.  Will watch your condition.  Will get help right away if you are not doing well or get worse. Document Released: 06/09/2011 Document Reviewed: 05/10/2013 [employer] Patient Information 2015 [place], [place]. This information is not intended to replace advice given to you by your health care provider. Make sure you discuss any questions you have with your health care provider.

## 2014-11-21 NOTE — ED Provider Notes (Signed)
CSN: [id number]   Arrival date & time 11/20/14 2107  History  This chart was scribed for Zadie Rhine, MD by Bethel Born, ED Scribe. This patient was seen in room A03C/A03C and the patient's care was started at 12:23 AM.  Chief Complaint  Patient presents with  . Neck Pain    HPI Patient is a 74 y.o. male presenting with neck pain. The history is provided by the patient. No language interpreter was used.  Neck Pain Pain location:  R side Pain radiates to:  Does not radiate Pain severity:  Moderate Onset quality:  Gradual Duration:  1 day Timing:  Constant Progression:  Unchanged Chronicity:  New Context: not fall   Relieved by:  Nothing Worsened by:  Bending and swallowing Ineffective treatments:  Analgesics Associated symptoms: fever and headaches   Associated symptoms: no chest pain, no leg pain, no numbness and no weakness    STEPHEN BARUCH is a 74 y.o. male with chronic back pain who presents to the Emergency Department complaining of constant right-sided neck pain with gradual onset 4 days ago while at therapy at the [facility]. At therapy he is required to frequently bend the neck and perform tongue exercises. The pain radiates to the head. Tylenol #3 provided insufficient pain relief PTA. Movement of the neck and swallowing (variably) exacerbate the pain. He was seen at Urgent Care on 11/18/14 where he had a negative cervical spine XR. Associated symptoms include fever of 100 at home today. Also complains of diarrhea and cramping abdominal pain. Pt denies cough, chest pain, nausea, vomiting, weakness,new numbness or tingling, and lower back pain. No recent fall or trauma. He has a scheduled appointment with his PCP tomorrow.   Past Medical History  Diagnosis Date  . Allergic rhinitis   . GERD (gastroesophageal reflux disease)   . Skin cancer   . Early cataracts, bilateral   . Diverticulosis   . Mitral valve prolapse   . Kidney stones   . Gall  stones   . Headache(784.0)   . MRSA (methicillin resistant Staphylococcus aureus) carrier   . Constipation   . Thrombocytopenia   . Complication of anesthesia     " I had some coughing afterwards for a couple of days"  . Anginal pain     pt states comes and goes pt states he feels is related to acid reflux or his gallbaldder issues occas uses aloe vera juice or calcium carbonate tab to relieve   . Pneumonia     hx of in childhood   . Shortness of breath     walking distances or climbing stairs   . Tuberculosis     pt states has been told he has been exposed in past; positive skin test in past   . Anxiety   . Urinary frequency   . Arthritis   . Tingling     hands and feet bilat     Past Surgical History  Procedure Laterality Date  . Tonsillectomy    . Hernia repair    . Appendectomy    . Colonoscopy w/ biopsies and polypectomy    . Nissen fundoplication    . Esophagogastroduodenoscopy N/A 12/09/2013    Procedure: ESOPHAGOGASTRODUODENOSCOPY (EGD);  Surgeon: Willis Modena, MD;  Location: [facility] ENDOSCOPY;  Service: Endoscopy;  Laterality: N/A;  . Cholecystectomy N/A 09/29/2014    Procedure: LAPAROSCOPIC CHOLECYSTECTOMY WITH INTRAOPERATIVE CHOLANGIOGRAM;  Surgeon: Ovidio Kin, MD;  Location: [facility] ORS;  Service: General;  Laterality: N/A;  Family History  Problem Relation Age of Onset  . Parkinsonism Brother   . COPD Mother   . Allergies Mother   . Heart failure Mother   . COPD Father   . Stroke Father   . Cancer Maternal Grandfather   . Other Sister   . Suicidality Maternal Aunt     Social History  Substance Use Topics  . Smoking status: Never Smoker   . Smokeless tobacco: Never Used  . Alcohol Use: Yes     Comment: social     Review of Systems  Constitutional: Positive for fever.  Cardiovascular: Negative for chest pain.  Musculoskeletal: Positive for neck pain. Negative for back pain.  Neurological: Positive for headaches. Negative for weakness and numbness.  All  other systems reviewed and are negative.   Home Medications   Prior to Admission medications   Medication Sig Start Date End Date Taking? Authorizing Provider  acetaminophen-codeine (TYLENOL #3) 300-30 MG per tablet Take 1 tablet by mouth every 6 (six) hours as needed for moderate pain. 09/29/14   Ovidio Kin, MD  ALPRAZolam Prudy Feeler) 0.25 MG tablet Take 0.0625-0.25 mg by mouth at bedtime as needed for sleep.     Historical Provider, MD  aspirin EC 81 MG tablet Take 81 mg by mouth every other day. AM    Historical Provider, MD  cholecalciferol (VITAMIN D) 1000 UNITS tablet Take 1,000 Units by mouth 2 (two) times daily.    Historical Provider, MD  Coenzyme Q10 (VITALINE COQ10) 300 MG WAFR Take 2 tablets by mouth daily.    Historical Provider, MD  Cranberry 400 MG CAPS Take 400-1,200 mg by mouth every morning.     Historical Provider, MD  DiphenhydrAMINE HCl, Sleep, (RESTFULLY SLEEP PO) Take 1 tablet by mouth at bedtime. Another possibly at 3 am when he wakes up.    Historical Provider, MD  Homeopathic Products [facility] ALLERGY EYE RELIEF OP) Apply 1 drop to eye 3 (three) times daily as needed (irritation.).    Historical Provider, MD  lansoprazole (PREVACID) 15 MG capsule Take 15 mg by mouth every morning.     Historical Provider, MD  loratadine (CLARITIN) 10 MG tablet Take 10 mg by mouth every morning.    Historical Provider, MD  Milk Thistle 175 MG CAPS Take 1 capsule by mouth daily.     Historical Provider, MD  montelukast (SINGULAIR) 10 MG tablet Take 10 mg by mouth at bedtime.    Historical Provider, MD  Multiple Vitamin (MULTIVITAMIN) capsule Take 1 capsule by mouth every morning.     Historical Provider, MD  OVER THE COUNTER MEDICATION Take 3 capsules by mouth 2 (two) times daily. Lion's mane 0.5 gram/capsule    Historical Provider, MD  OVER THE COUNTER MEDICATION Take 1 tablet by mouth every morning. Bacopa - 750 mg    Historical Provider, MD  OVER THE COUNTER MEDICATION Take 1 tablet by  mouth daily with supper. Bone Strength    Historical Provider, MD  OVER THE COUNTER MEDICATION Take 2 capsules by mouth every morning. Reparagen supplement    Historical Provider, MD  OVER THE COUNTER MEDICATION Take 1 tablet by mouth daily as needed (for eye migraine). Mygra Few    Historical Provider, MD  OVER THE COUNTER MEDICATION Take 50 mg by mouth 3 (three) times daily. Magnesium 50 mg    Historical Provider, MD  OVER THE COUNTER MEDICATION Take 2 capsules by mouth daily. Panax Ginseng    Historical Provider, MD  Phosphatidylserine 100 MG  CAPS Take 200 mg by mouth every morning.    Historical Provider, MD  Saw Palmetto, Serenoa repens, (SAW PALMETTO PO) Take 1 capsule by mouth 2 (two) times daily.    Historical Provider, MD  sodium chloride (MURO 128) 5 % ophthalmic ointment Place 1 application into both eyes at bedtime.    Historical Provider, MD  sodium chloride (MURO 128) 5 % ophthalmic solution Place 1 drop into both eyes 3 (three) times daily.    Historical Provider, MD  THEANINE PO Take 2 tablets by mouth 2 (two) times daily.     Historical Provider, MD  vitamin C (ASCORBIC ACID) 250 MG tablet Take 250 mg by mouth daily.    Historical Provider, MD  zaleplon (SONATA) 5 MG capsule Take 5 mg by mouth daily as needed for sleep. Usually takes at 3 am    Historical Provider, MD    Allergies  Metoclopramide hcl; Metronidazole; and Sulfamethoxazole-trimethoprim  Triage Vitals: BP 136/58 mmHg  Pulse 78  Temp(Src) 98.8 F (37.1 C) (Oral)  Resp 16  Ht  (1.753 m)  Wt 122 lb (55.339 kg)  BMI 18.01 kg/m2  SpO2 98% Physical Exam  Nursing note and vitals reviewed. CONSTITUTIONAL: elderly but no acute distress HEAD: Normocephalic/atraumatic EYES: EOMI/PERRL ENMT: Mucous membranes moist, no trismus NECK: Diffuse paraspinal tenderness noted SPINE/BACK:entire spine nontender, No bruising/crepitance/stepoffs noted to spine CV: S1/S2 noted LUNGS: Lungs are clear to auscultation  bilaterally, no apparent distress ABDOMEN: soft, nontender, no rebound or guarding, bowel sounds noted throughout abdomen NEURO: Pt is awake/alert/appropriate, moves all extremitiesx4.  No facial droop. Equal power (5/5) with hand grip, wrist flex/extension, elbow flex/extension, and equal power with shoulder abduction/adduction.  No focal sensory deficit to light touch is noted in either UE.   Equal (2+) biceps/brachioradialis/tricep reflex in bilateral UE   EXTREMITIES: pulses normal/equal, full ROM SKIN: warm, color normal PSYCH: no abnormalities of mood noted, alert and oriented to situation  ED Course  Procedures   DIAGNOSTIC STUDIES: Oxygen Saturation is 98% on RA, normal by my interpretation.    COORDINATION OF CARE: 12:33 AM Discussed treatment plan which includes lab work with pt at bedside and pt agreed to plan. Pt declines CT scan due to history of skin cancer and aversion to further radiation.  Pt well appearing No signs of meningitis (no fever here, he has reproducible neck tenderness, HA radiates from neck and he is nontoxic in appearance) No h/o trauma.  However feels this all started after significant neck movement while performing therapy last week.  No focal weakness to suggest acute spinal pathology.  I advised CT imaging to evaluate bony structure of neck (already had negative xray) but he declined and reports he has PCP Followup later today He has no signs of stroke He denied any new weakness on my exam and he had no neuro deficits   Labs Reviewed  COMPREHENSIVE METABOLIC PANEL - Abnormal; Notable for the following:    Glucose, Bld 115 (*)    Total Protein 6.3 (*)    ALT 16 (*)    All other components within normal limits  CBC WITH DIFFERENTIAL/PLATELET - Abnormal; Notable for the following:    RBC 4.21 (*)    Platelets 123 (*)    All other components within normal limits  URINALYSIS, ROUTINE W REFLEX MICROSCOPIC (NOT AT [facility])  I-STAT CG4 LACTIC ACID, ED   I-STAT CG4 LACTIC ACID, ED    I, Zadie Rhine, MD, personally reviewed and evaluated these lab results  as part of my medical decision-making.      MDM   Final diagnoses:  Cervical paraspinal muscle spasm     Nursing notes including past medical history and social history reviewed and considered in documentation Labs/vital reviewed myself and considered during evaluation  I personally performed the services described in this documentation, which was scribed in my presence. The recorded information has been reviewed and is accurate.      Zadie Rhine, MD 11/21/14 [phone]

## 2014-11-21 NOTE — ED Notes (Signed)
Pt stable, ambulatory, states understanding of discharge instructions 

## 2015-01-16 ENCOUNTER — Ambulatory Visit (INDEPENDENT_AMBULATORY_CARE_PROVIDER_SITE_OTHER): Payer: BLUE CROSS/BLUE SHIELD | Admitting: Family Medicine

## 2015-01-16 ENCOUNTER — Encounter: Payer: Self-pay | Admitting: Family Medicine

## 2015-01-16 VITALS — Ht 69.0 in | Wt 129.3 lb

## 2015-01-16 DIAGNOSIS — R634 Abnormal weight loss: Secondary | ICD-10-CM

## 2015-01-16 DIAGNOSIS — M810 Age-related osteoporosis without current pathological fracture: Secondary | ICD-10-CM | POA: Diagnosis not present

## 2015-01-16 NOTE — Patient Instructions (Addendum)
-   Continue to eat small quantities frequently during the day.    - Meals:  Include a source of protein, starch, and veg's and/or fruit.     - You will need to eat MORE at some meals than you might really want if you are to help your GI system adapt to more food.    - Snacks to keep on hand:  Clif (or other) bars, fruit, nut/seeds mix, yogurt, string cheese, goat cheese on crackers, hummus. - Increase fiber SLOWLY and PROGRESSIVELY.    - You might want to include some flax seed meal to cereal (keep refrigerated), which will allow you to better control the amount you get.    - Start with 1 teaspoon, and increasing to as much as 1 1/2 tbsp.   - Go for at least one full week at each dose before increasing any.   - Aim for at least 60 oz of fluids per day.    - Test your tolerance for fat b/c more of these foods will help you meet your energy needs (to gain weight).      - High quality fats:  Avocado, XV olive oil, nuts, seeds, fish that is not fried, canola oil.    [email or web address]:  Email to ask about recipes as well as Debbie's gastroparesis diet.

## 2015-01-16 NOTE — Progress Notes (Signed)
Medical Nutrition Therapy:  Appt start time: 1430 end time:  1530.  Assessment:  Primary concerns today: osteoporosis, unintentional weight loss (M81.0 & R63.4).   Lee Nelson had a cholecystectomy September 29, 2014.  He has had increased digestive problems since then, and he's had trouble keeping his wt up b/c he's trying to limit his fat intake.  At bedtime, takes aloe and Dr. Bernadene Bellhhira probiotic, both of which have mild laxative effects he said, and he takes a different probiotic during the day.  Lee Nelson has a long history of reflux; had Nissen fundoplication surgery years ago.  Lee Nelson has not kept a symptoms log along with a food record.    Discussed the possibility that Lee Nelson's alternating constipation with diarrhea may be at least partly related to anxiety.  He does not sleep well, usually waking up in early AM, i.e., 3 or 4 AM with racing thoughts and worries, and can't get back to sleep unless he takes ativan.  Frequently will go ahead and get up, ride the stationary bike for 30 min, then go back to bed to another hour of sleep before getting up ~7 AM.    Also discussed the fact that Lee Nelson will need to experience discomfort that comes with allowing his body to re-adapt to a higher food intake; this is just an expected part of the process.   Learning Readiness: Change in progress  Barriers to learning/adherence to lifestyle change: Can't figure out which foods are tolerated well or not; is wary of increasing dietary fat since cholecystectomy in July, but weight loss is also a concern, especially in light of his osteoporosis.  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  Usual eating pattern includes 3 meals and 2 snacks per day. Frequent foods and beverages include grapes, Clif bars, 18-24 oz green tea, Nature's Path Millet Rice cereal, goat milk, almond/coconut milk, beans  a couple X wk.  Avoided foods include fried & spicy foods, yogurt.   Usual physical activity includes walk the dog 1-2 X day (15-20 min total), and stat bike ~30 min daily.    24-hr recall: (Up at 5 AM; stat bike 30 min; back to bed till ~7 AM) B (7 AM)-   1 1/2-2 c Nature's Path Millet & Rice cereal, whey pro powder (10-12 g), 1 c almond/coconut milk,    2 c grapes, 1/2 WF walnut muffin Snk ( AM)-   Clif bar L ( PM)-  ?? Chx, rice, garbanzos, broccoli, drsng Snk ( PM)-  Clif bar D (7 PM)-  ?? Chx, rice, garbanzos, broccoli, drsng, water?? Snk (7:30)-  1 WF walnut muffin, 2 oz goat milk Typical day? Yes.  although not really sure b./c can't remember.    Progress Towards Goal(s):  In progress.   Nutritional Diagnosis:  NB-1.1 Food and nutrition-related knowledge deficit As related to cholecystectomy as well as osteoporosis.  As evidenced by self-report of confusion as to acceptable foods.    Intervention:  Nutrition education.  Handouts given during visit include:  AVS  Demonstrated degree of understanding via:  Teach Back   Monitoring/Evaluation:  Dietary intake, exercise, and body weight prn.

## 2015-04-03 ENCOUNTER — Telehealth: Payer: Self-pay | Admitting: Internal Medicine

## 2015-04-03 NOTE — Telephone Encounter (Signed)
Received records from [name] for appointment on 05/03/15 with Dr  Hilty.  Records given to N Hines (medical records) for Dr Hilty's schedule on 05/03/15. lp °

## 2015-04-24 ENCOUNTER — Other Ambulatory Visit: Payer: Self-pay

## 2015-04-24 MED ORDER — MONTELUKAST SODIUM 10 MG PO TABS: 10.0000 mg | ORAL_TABLET | Freq: Every day | ORAL | Status: DC

## 2015-05-03 ENCOUNTER — Ambulatory Visit (INDEPENDENT_AMBULATORY_CARE_PROVIDER_SITE_OTHER): Payer: BLUE CROSS/BLUE SHIELD | Admitting: Internal Medicine

## 2015-05-03 ENCOUNTER — Encounter: Payer: Self-pay | Admitting: Internal Medicine

## 2015-05-03 VITALS — BP 96/58 | HR 57 | Ht 70.0 in | Wt 129.7 lb

## 2015-05-03 DIAGNOSIS — R002 Palpitations: Secondary | ICD-10-CM

## 2015-05-03 DIAGNOSIS — R262 Difficulty in walking, not elsewhere classified: Secondary | ICD-10-CM

## 2015-05-03 DIAGNOSIS — R079 Chest pain, unspecified: Secondary | ICD-10-CM | POA: Diagnosis not present

## 2015-05-03 DIAGNOSIS — R0600 Dyspnea, unspecified: Secondary | ICD-10-CM | POA: Insufficient documentation

## 2015-05-03 DIAGNOSIS — R0602 Shortness of breath: Secondary | ICD-10-CM

## 2015-05-03 DIAGNOSIS — R072 Precordial pain: Secondary | ICD-10-CM

## 2015-05-03 NOTE — Patient Instructions (Signed)
Your physician has requested that you have a lexiscan myoview. For further information please visit [email or web address] Please follow instruction sheet, as given.  Your physician has recommended that you wear a 48-hour holter monitor. Holter monitors are medical devices that record the heart's electrical activity. Doctors most often use these monitors to diagnose arrhythmias. Arrhythmias are problems with the speed or rhythm of the heartbeat. The monitor is a small, portable device. You can wear one while you do your normal daily activities. This is usually used to diagnose what is causing palpitations/syncope (passing out). You will have to make an appointment to come back to have this placed.  Dr Rennis Golden recommends that you schedule a follow-up appointment after your holter monitor and lexiscan.

## 2015-05-03 NOTE — Progress Notes (Signed)
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 Karsten Fells with [facility] and vascular center until 2010. In the past he said complaints of chest pain and shortness of breath but underwent workup including stress test and echocardiogram which was negative. He also had some lower extremity leg pain concerning for claudication but had normal arterial Dopplers. Recently he's had some left chest pain and shortness of breath. He also feels some discomfort from time to time and almost on a daily basis some fluttering or what described as palpitations. He saw his primary care provider for this who referred him back to our office. He does not have a lot of traditional cardiac risk factors. He denies hypertension, diabetes or significant family history of coronary disease. He reports he generally been thin most of his life and has a low blood pressure and low heart rate at rest.  PMHx:  Past Medical History  Diagnosis Date  . Allergic rhinitis   . GERD (gastroesophageal reflux disease)   . Skin cancer   . Early cataracts, bilateral   . Diverticulosis   . Mitral valve prolapse   . Kidney stones   . Gall stones   . Headache(784.0)   . MRSA (methicillin resistant Staphylococcus aureus) carrier   . Constipation   . Thrombocytopenia (HCC)   . Complication of anesthesia     " I had some coughing afterwards for a couple of days"  . Anginal pain (HCC)     pt states comes and goes pt states he feels is related to acid reflux or his gallbaldder issues occas uses aloe vera juice or calcium carbonate tab to relieve   . Pneumonia     hx of in childhood   . Shortness of breath     walking distances or climbing stairs   . Tuberculosis     pt states has been told he has been exposed in past; positive skin test in past   . Anxiety     . Urinary frequency   . Arthritis   . Tingling     hands and feet bilat     Past Surgical History  Procedure Laterality Date  . Tonsillectomy    . Hernia repair    . Appendectomy    . Colonoscopy w/ biopsies and polypectomy    . Nissen fundoplication    . Esophagogastroduodenoscopy N/A 12/09/2013    Procedure: ESOPHAGOGASTRODUODENOSCOPY (EGD);  Surgeon: Willis Modena, MD;  Location: [facility] ENDOSCOPY;  Service: Endoscopy;  Laterality: N/A;  . Cholecystectomy N/A 09/29/2014    Procedure: LAPAROSCOPIC CHOLECYSTECTOMY WITH INTRAOPERATIVE CHOLANGIOGRAM;  Surgeon: Ovidio Kin, MD;  Location: [facility] ORS;  Service: General;  Laterality: N/A;    FAMHx:  Family History  Problem Relation Age of Onset  . Parkinsonism Brother   . COPD Mother   . Allergies Mother   . Heart failure Mother   . COPD Father   . Stroke Father   . Cancer Maternal Grandfather   . Other Sister   . Suicidality Maternal Aunt     SOCHx:   reports that he has never smoked. He has never used smokeless tobacco. He reports that he drinks alcohol. He reports that he does not use illicit drugs.  ALLERGIES:  Allergies  Allergen Reactions  . Metoclopramide Hcl Other (See Comments)    REACTION: "involuntary movements"  .  Metronidazole Other (See Comments)    REACTION: no appetite, diarrhea after meal, decrease in weight  . Sulfamethoxazole-Trimethoprim Other (See Comments)    REACTION: "involuntary movements"    ROS: A comprehensive review of systems was negative except for: Respiratory: positive for dyspnea on exertion Cardiovascular: positive for chest pain and palpitations  HOME MEDS: Current Outpatient Prescriptions  Medication Sig Dispense Refill  . acetaminophen-codeine (TYLENOL #3) 300-30 MG per tablet Take 1 tablet by mouth every 6 (six) hours as needed for moderate pain. 30 tablet 0  . ALPRAZolam (XANAX) 0.25 MG tablet Take 0.0625-0.25 mg by mouth at bedtime as needed for sleep.     Marland Kitchen aspirin EC 81 MG tablet  Take 81 mg by mouth every other day. AM    . cholecalciferol (VITAMIN D) 1000 UNITS tablet Take 1,000 Units by mouth 2 (two) times daily.    . Coenzyme Q10 (VITALINE COQ10) 300 MG WAFR Take 2 tablets by mouth daily.    . Cranberry 400 MG CAPS Take 400-1,200 mg by mouth every morning.     . DiphenhydrAMINE HCl, Sleep, (RESTFULLY SLEEP PO) Take 1 tablet by mouth at bedtime. Another possibly at 3 am when he wakes up.    . lansoprazole (PREVACID) 15 MG capsule Take 15 mg by mouth every morning.     . loratadine (CLARITIN) 10 MG tablet Take 10 mg by mouth every morning.    . Milk Thistle 175 MG CAPS Take 1 capsule by mouth daily.     . montelukast (SINGULAIR) 10 MG tablet Take 1 tablet (10 mg total) by mouth at bedtime. 30 tablet 5  . Multiple Vitamin (MULTIVITAMIN) capsule Take 1 capsule by mouth every morning.     Marland Kitchen OVER THE COUNTER MEDICATION Take 3 capsules by mouth 2 (two) times daily. Lion's mane 0.5 gram/capsule    . OVER THE COUNTER MEDICATION Take 1 tablet by mouth every morning. Bacopa - 750 mg    . OVER THE COUNTER MEDICATION Take 1 tablet by mouth daily with supper. Bone Strength    . OVER THE COUNTER MEDICATION Take 2 capsules by mouth every morning. Reparagen supplement    . OVER THE COUNTER MEDICATION Take 1 tablet by mouth daily as needed (for eye migraine). Mygra Few    . OVER THE COUNTER MEDICATION Take 100 mg by mouth 1 day or 1 dose. Magnesium 50 mg    . OVER THE COUNTER MEDICATION Take 2 capsules by mouth daily. Panax Ginseng    . OVER THE COUNTER MEDICATION CocoaVia flavonoids    . Phosphatidylserine 100 MG CAPS Take 200 mg by mouth every morning.    . Probiotic Product (PROBIOTIC DAILY PO) Take by mouth.    . Saw Palmetto, Serenoa repens, (SAW PALMETTO PO) Take 1 capsule by mouth 2 (two) times daily.    . sodium chloride (MURO 128) 5 % ophthalmic ointment Place 1 application into both eyes at bedtime.    . sodium chloride (MURO 128) 5 % ophthalmic solution Place 1 drop into  both eyes 3 (three) times daily.    . THEANINE PO Take 2 tablets by mouth 2 (two) times daily.     . vitamin C (ASCORBIC ACID) 250 MG tablet Take 250 mg by mouth daily.    . zaleplon (SONATA) 5 MG capsule Take 5 mg by mouth daily as needed for sleep. Usually takes at 3 am     No current facility-administered medications for this visit.    LABS/IMAGING: No results found  for this or any previous visit (from the past 48 hour(s)). No results found.  WEIGHTS: Wt Readings from Last 3 Encounters:  05/03/15 129 lb 11.2 oz (58.832 kg)  01/16/15 129 lb 4.8 oz (58.65 kg)  11/21/14 122 lb (55.339 kg)    VITALS: BP 96/58 mmHg  Pulse 57  Ht  (1.778 m)  Wt 129 lb 11.2 oz (58.832 kg)  BMI 18.61 kg/m2  EXAM: General appearance: alert and no distress Neck: no carotid bruit, no JVD and thyroid not enlarged, symmetric, no tenderness/mass/nodules Lungs: clear to auscultation bilaterally Heart: regular rate and rhythm, S1, S2 normal, no murmur, click, rub or gallop Abdomen: soft, non-tender; bowel sounds normal; no masses,  no organomegaly Extremities: extremities normal, atraumatic, no cyanosis or edema Pulses: 2+ and symmetric Skin: Skin color, texture, turgor normal. No rashes or lesions Neurologic: Grossly normal Psych: Pleasant  EKG: Sinus bradycardia 57, rightward axis  ASSESSMENT: 1. Chest pain 2. Dyspnea on exertion which is progressive 3. Probable palpitations  PLAN: 1.   Mr. Neidig is describing some chest pain and progressive dyspnea on exertion. He has not had any cardiac evaluation in the past 7 years. I like to get a repeat nuclear stress test to evaluate myocardial perfusion. This also gives opportunity to assess LV function. It also like to set up a 48 hour monitor to evaluate his palpitations. He says that he has symptoms almost on a daily basis. Plan to see her back to discuss the findings of the studies in a few weeks. Thanks again for the kind referral.  Chrystie Nose, MD, [facility] Attending Cardiologist [facility]  Chrystie Nose 05/03/2015, 12:46 PM

## 2015-05-08 ENCOUNTER — Encounter (INDEPENDENT_AMBULATORY_CARE_PROVIDER_SITE_OTHER): Payer: BLUE CROSS/BLUE SHIELD

## 2015-05-08 DIAGNOSIS — R002 Palpitations: Secondary | ICD-10-CM | POA: Diagnosis not present

## 2015-05-08 DIAGNOSIS — R0602 Shortness of breath: Secondary | ICD-10-CM | POA: Diagnosis not present

## 2015-05-08 DIAGNOSIS — R079 Chest pain, unspecified: Secondary | ICD-10-CM

## 2015-05-15 ENCOUNTER — Telehealth (HOSPITAL_COMMUNITY): Payer: Self-pay

## 2015-05-15 NOTE — Telephone Encounter (Signed)
Encounter complete. 

## 2015-05-17 ENCOUNTER — Encounter (HOSPITAL_COMMUNITY): Payer: BLUE CROSS/BLUE SHIELD

## 2015-05-17 ENCOUNTER — Ambulatory Visit (HOSPITAL_COMMUNITY)
Admission: RE | Admit: 2015-05-17 | Discharge: 2015-05-17 | Disposition: A | Discharge status: Home / Self Care | Payer: BLUE CROSS/BLUE SHIELD | Source: Ambulatory Visit | Attending: Cardiology | Admitting: Cardiology

## 2015-05-17 DIAGNOSIS — R42 Dizziness and giddiness: Secondary | ICD-10-CM | POA: Diagnosis not present

## 2015-05-17 DIAGNOSIS — R002 Palpitations: Secondary | ICD-10-CM | POA: Insufficient documentation

## 2015-05-17 DIAGNOSIS — K219 Gastro-esophageal reflux disease without esophagitis: Secondary | ICD-10-CM | POA: Diagnosis not present

## 2015-05-17 DIAGNOSIS — R0609 Other forms of dyspnea: Secondary | ICD-10-CM | POA: Insufficient documentation

## 2015-05-17 DIAGNOSIS — R262 Difficulty in walking, not elsewhere classified: Secondary | ICD-10-CM | POA: Diagnosis not present

## 2015-05-17 DIAGNOSIS — R0602 Shortness of breath: Secondary | ICD-10-CM

## 2015-05-17 DIAGNOSIS — R079 Chest pain, unspecified: Secondary | ICD-10-CM | POA: Diagnosis not present

## 2015-05-17 LAB — MYOCARDIAL PERFUSION IMAGING
LV dias vol: 102 mL
LV sys vol: 47 mL
Peak HR: 81 {beats}/min
Rest HR: 57 {beats}/min
SDS: 0
SRS: 0
SSS: 0
TID: 1.04

## 2015-05-17 MED ORDER — TECHNETIUM TC 99M SESTAMIBI GENERIC - CARDIOLITE
10.3000 | Freq: Once | INTRAVENOUS | Status: AC | PRN
  Administered 2015-05-17: 10.3 via INTRAVENOUS

## 2015-05-17 MED ORDER — TECHNETIUM TC 99M SESTAMIBI GENERIC - CARDIOLITE
31.4000 | Freq: Once | INTRAVENOUS | Status: AC | PRN
  Administered 2015-05-17: 31.4 via INTRAVENOUS

## 2015-05-17 MED ORDER — REGADENOSON 0.4 MG/5ML IV SOLN
0.4000 mg | Freq: Once | INTRAVENOUS | Status: AC
  Administered 2015-05-17: 0.4 mg via INTRAVENOUS

## 2015-05-17 MED ORDER — AMINOPHYLLINE 25 MG/ML IV SOLN
100.0000 mg | Freq: Once | INTRAVENOUS | Status: AC
  Administered 2015-05-17: 100 mg via INTRAVENOUS

## 2015-05-31 ENCOUNTER — Ambulatory Visit (INDEPENDENT_AMBULATORY_CARE_PROVIDER_SITE_OTHER): Payer: BLUE CROSS/BLUE SHIELD | Admitting: Internal Medicine

## 2015-05-31 ENCOUNTER — Encounter: Payer: Self-pay | Admitting: Internal Medicine

## 2015-05-31 VITALS — BP 112/66 | HR 62 | Ht 68.5 in | Wt 132.3 lb

## 2015-05-31 DIAGNOSIS — R0600 Dyspnea, unspecified: Secondary | ICD-10-CM

## 2015-05-31 DIAGNOSIS — R072 Precordial pain: Secondary | ICD-10-CM | POA: Diagnosis not present

## 2015-05-31 DIAGNOSIS — R002 Palpitations: Secondary | ICD-10-CM

## 2015-05-31 MED ORDER — METOPROLOL SUCCINATE ER 25 MG PO TB24: 12.5000 mg | ORAL_TABLET | Freq: Every day | ORAL | Status: DC

## 2015-05-31 NOTE — Patient Instructions (Signed)
Medication Instructions:  Your physician has recommended you make the following change in your medication:  START Metoprolol 12.5mg  daily. An Rx has been sent to your pharmacy   Labwork: None ordered  Testing/Procedures: None ordered  Follow-Up: Your physician recommends that you schedule a follow-up appointment in: 3 months   Any Other Special Instructions Will Be Listed Below (If Applicable).     If you need a refill on your cardiac medications before your next appointment, please call your pharmacy.

## 2015-05-31 NOTE — Progress Notes (Signed)
OFFICE NOTE  Chief Complaint:   follow-up Lexiscan and monitor.  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 In the past he said complaints of chest pain and shortness of breath but underwent workup including stress test and echocardiogram which was negative. He also had some lower extremity leg pain concerning for claudication but had normal arterial Dopplers. Recently he's had some left chest pain and shortness of breath. He also feels some discomfort from time to time and almost on a daily basis some fluttering or what described as palpitations. He saw his primary care provider for this who referred him back to our office. He does not have a lot of traditional cardiac risk factors. He denies hypertension, diabetes or significant family history of coronary disease. He reports he generally been thin most of his life and has a low blood pressure and low heart rate at rest.  [name] returns today for follow-up of his lexiscan stress test and monitor. The stress test was negative for ischemia with an EF of 54%. He wore the 48 hour monitor which demonstrated an episode of PSVT. This was less than 10 seconds however could've made him symptomatic. This could explain why been feeling some palpitations. It did not appear to be atrial fibrillation or atrial flutter.  PMHx:  Past Medical History  Diagnosis Date  . Allergic rhinitis   . GERD (gastroesophageal reflux disease)   . Skin cancer   . Early cataracts, bilateral   . Diverticulosis   . Mitral valve prolapse   . Kidney stones   . Gall stones   . Headache(784.0)   . MRSA (methicillin resistant Staphylococcus aureus) carrier   . Constipation   . Thrombocytopenia (HCC)   . Complication of anesthesia     " I had some coughing afterwards for a couple of days"  .  Anginal pain (HCC)     pt states comes and goes pt states he feels is related to acid reflux or his gallbaldder issues occas uses aloe vera juice or calcium carbonate tab to relieve   . Pneumonia     hx of in childhood   . Shortness of breath     walking distances or climbing stairs   . Tuberculosis     pt states has been told he has been exposed in past; positive skin test in past   . Anxiety   . Urinary frequency   . Arthritis   . Tingling     hands and feet bilat     Past Surgical History  Procedure Laterality Date  . Tonsillectomy    . Hernia repair    . Appendectomy    . Colonoscopy w/ biopsies and polypectomy    . Nissen fundoplication    . Esophagogastroduodenoscopy N/A 12/09/2013    Procedure: ESOPHAGOGASTRODUODENOSCOPY (EGD);  Surgeon: Willis Modena, MD;  Location: [facility] ENDOSCOPY;  Service: Endoscopy;  Laterality: N/A;  . Cholecystectomy N/A 09/29/2014    Procedure: LAPAROSCOPIC CHOLECYSTECTOMY WITH INTRAOPERATIVE CHOLANGIOGRAM;  Surgeon: Ovidio Kin, MD;  Location: [facility] ORS;  Service: General;  Laterality: N/A;    FAMHx:  Family History  Problem Relation Age of Onset  . Parkinsonism Brother   . COPD Mother   . Allergies Mother   . Heart failure Mother   . COPD Father   . Stroke Father   .  Cancer Maternal Grandfather   . Other Sister   . Suicidality Maternal Aunt     SOCHx:   reports that he has never smoked. He has never used smokeless tobacco. He reports that he drinks alcohol. He reports that he does not use illicit drugs.  ALLERGIES:  Allergies  Allergen Reactions  . Metoclopramide Hcl Other (See Comments)    REACTION: "involuntary movements"  . Metronidazole Other (See Comments)    REACTION: no appetite, diarrhea after meal, decrease in weight  . Sulfamethoxazole-Trimethoprim Other (See Comments)    REACTION: "involuntary movements"    ROS: A comprehensive review of systems was negative except for: Cardiovascular: positive for palpitations  HOME  MEDS: Current Outpatient Prescriptions  Medication Sig Dispense Refill  . acetaminophen-codeine (TYLENOL #3) 300-30 MG per tablet Take 1 tablet by mouth every 6 (six) hours as needed for moderate pain. 30 tablet 0  . ALPRAZolam (XANAX) 0.25 MG tablet Take 0.0625-0.25 mg by mouth at bedtime as needed for sleep.     Marland Kitchen aspirin EC 81 MG tablet Take 81 mg by mouth every other day. AM    . Coenzyme Q10 (VITALINE COQ10) 300 MG WAFR Take 2 tablets by mouth daily.    . Cranberry 400 MG CAPS Take 400-1,200 mg by mouth every morning.     . Ginger 500 MG CAPS Take 500 mg by mouth daily. Take 1-2 times daily    . Gotu [place], Centella asiatica, (GOTU KOLA PO) Take 500 mg by mouth daily. Take 1-2 times daily    . lansoprazole (PREVACID) 15 MG capsule Take 15 mg by mouth every morning.     . loratadine (CLARITIN) 10 MG tablet Take 10 mg by mouth every morning.    . Milk Thistle 175 MG CAPS Take 1 capsule by mouth daily.     . montelukast (SINGULAIR) 10 MG tablet Take 1 tablet (10 mg total) by mouth at bedtime. 30 tablet 5  . Multiple Vitamin (MULTIVITAMIN) capsule Take 1 capsule by mouth every morning.     Marland Kitchen OMEGA-3 FAT AC-CHOLECALCIFEROL PO Take 1 capsule by mouth daily.    Marland Kitchen OVER THE COUNTER MEDICATION Take 3 capsules by mouth 2 (two) times daily. Lion's mane 0.5 gram/capsule    . OVER THE COUNTER MEDICATION Take 1 tablet by mouth every morning. Bacopa - 750 mg    . OVER THE COUNTER MEDICATION Take 1 tablet by mouth daily with supper. Bone Strength    . OVER THE COUNTER MEDICATION Take 2 capsules by mouth every morning. Reparagen supplement    . OVER THE COUNTER MEDICATION Take 1 tablet by mouth daily as needed (for eye migraine). Mygra Few    . OVER THE COUNTER MEDICATION Take 100 mg by mouth 1 day or 1 dose. Magnesium;Take 2-3 daily with Bone Strength.    Marland Kitchen OVER THE COUNTER MEDICATION Take 2 capsules by mouth daily. Panax Ginseng    . OVER THE COUNTER MEDICATION CocoaVia flavonoids    . Phosphatidylserine  100 MG CAPS Take 200 mg by mouth every morning.    . Probiotic Product (PROBIOTIC DAILY PO) Take by mouth.    . sodium chloride (MURO 128) 5 % ophthalmic ointment Place 1 application into both eyes at bedtime.    . sodium chloride (MURO 128) 5 % ophthalmic solution Place 1 drop into both eyes 3 (three) times daily.    . THEANINE PO Take 2 tablets by mouth 2 (two) times daily.     . vitamin C (ASCORBIC  ACID) 250 MG tablet Take 250 mg by mouth daily.    . zaleplon (SONATA) 5 MG capsule Take 5 mg by mouth daily as needed for sleep. Usually takes at 3 am    . metoprolol succinate (TOPROL XL) 25 MG 24 hr tablet Take 0.5 tablets (12.5 mg total) by mouth daily. 15 tablet 6   No current facility-administered medications for this visit.    LABS/IMAGING: No results found for this or any previous visit (from the past 48 hour(s)). No results found.  WEIGHTS: Wt Readings from Last 3 Encounters:  05/31/15 132 lb 4.8 oz (60.011 kg)  05/17/15 129 lb (58.514 kg)  05/03/15 129 lb 11.2 oz (58.832 kg)    VITALS: BP 112/66 mmHg  Pulse 62  Ht 5' 8.5" (1.74 m)  Wt 132 lb 4.8 oz (60.011 kg)  BMI 19.82 kg/m2  EXAM: Deferred  EKG: Deferred  ASSESSMENT: 1. Chest pain - low risk nuclear stress test with normal LV function 2. Dyspnea on exertion - stable 3. PSVT  PLAN: 1.   Mr. Dagostino had a low risk nuclear stress test which is reassuring. His LV function is normal. He has had some vague dyspnea on exertion which is fairly stable. He may need pulmonary assessment by his primary provider. His monitor did captured episode of PSVT. It's not clear whether he was symptomatic with this. I do think he benefit from a low-dose beta blocker as he has been having symptomatic palpitations. I recommend starting Toprol-XL 12.5 mg daily. He tends to have a low blood pressure at rest and should monitor his blood pressure to make sure this does not go too low. I will plan to see him back in 2 months to see if this is  helped his symptoms.  Chrystie Nose, MD, [facility] Attending Cardiologist [facility]  [facility] 05/31/2015, 7:08 PM

## 2015-06-08 ENCOUNTER — Telehealth: Payer: Self-pay | Admitting: Internal Medicine

## 2015-06-08 NOTE — Telephone Encounter (Signed)
Pt started the beta blocker,had to stop because of his bladder problems. Should he start back taking it?

## 2015-06-08 NOTE — Telephone Encounter (Signed)
Spoke with pt, he had taken the metoprolol and had increased bladder problems, so he stopped the metoprolol. It has been 2-3 days off the med and his problems have improved. He wants to retry taking it and wanted to make sure that was ok. Okay given for pt to restart and he will call if his problems reoccur. Pt agreed with this plan.

## 2015-06-26 ENCOUNTER — Other Ambulatory Visit: Payer: Self-pay | Admitting: Orthopedic Surgery

## 2015-06-26 DIAGNOSIS — M25571 Pain in right ankle and joints of right foot: Secondary | ICD-10-CM

## 2015-06-26 DIAGNOSIS — M79671 Pain in right foot: Secondary | ICD-10-CM

## 2015-07-04 DIAGNOSIS — Z85828 Personal history of other malignant neoplasm of skin: Secondary | ICD-10-CM | POA: Diagnosis not present

## 2015-07-04 DIAGNOSIS — L821 Other seborrheic keratosis: Secondary | ICD-10-CM | POA: Diagnosis not present

## 2015-07-04 DIAGNOSIS — L570 Actinic keratosis: Secondary | ICD-10-CM | POA: Diagnosis not present

## 2015-07-04 DIAGNOSIS — L820 Inflamed seborrheic keratosis: Secondary | ICD-10-CM | POA: Diagnosis not present

## 2015-07-08 ENCOUNTER — Ambulatory Visit
Admission: RE | Admit: 2015-07-08 | Discharge: 2015-07-08 | Disposition: A | Discharge status: Home / Self Care | Payer: BLUE CROSS/BLUE SHIELD | Source: Ambulatory Visit | Attending: Orthopedic Surgery | Admitting: Orthopedic Surgery

## 2015-07-08 DIAGNOSIS — M25571 Pain in right ankle and joints of right foot: Secondary | ICD-10-CM

## 2015-07-08 DIAGNOSIS — M19071 Primary osteoarthritis, right ankle and foot: Secondary | ICD-10-CM | POA: Diagnosis not present

## 2015-07-08 DIAGNOSIS — M79671 Pain in right foot: Secondary | ICD-10-CM

## 2015-07-19 DIAGNOSIS — M79671 Pain in right foot: Secondary | ICD-10-CM | POA: Diagnosis not present

## 2015-07-19 DIAGNOSIS — M5441 Lumbago with sciatica, right side: Secondary | ICD-10-CM | POA: Diagnosis not present

## 2015-07-19 DIAGNOSIS — M25551 Pain in right hip: Secondary | ICD-10-CM | POA: Diagnosis not present

## 2015-07-24 DIAGNOSIS — G4700 Insomnia, unspecified: Secondary | ICD-10-CM | POA: Diagnosis not present

## 2015-07-24 DIAGNOSIS — R0789 Other chest pain: Secondary | ICD-10-CM | POA: Diagnosis not present

## 2015-07-24 DIAGNOSIS — M5412 Radiculopathy, cervical region: Secondary | ICD-10-CM | POA: Diagnosis not present

## 2015-07-24 DIAGNOSIS — K8020 Calculus of gallbladder without cholecystitis without obstruction: Secondary | ICD-10-CM | POA: Diagnosis not present

## 2015-08-01 DIAGNOSIS — N200 Calculus of kidney: Secondary | ICD-10-CM | POA: Diagnosis not present

## 2015-08-01 DIAGNOSIS — N401 Enlarged prostate with lower urinary tract symptoms: Secondary | ICD-10-CM | POA: Diagnosis not present

## 2015-08-01 DIAGNOSIS — R351 Nocturia: Secondary | ICD-10-CM | POA: Diagnosis not present

## 2015-08-01 DIAGNOSIS — Z0000 Encounter for general adult medical examination without abnormal findings: Secondary | ICD-10-CM | POA: Diagnosis not present

## 2015-08-28 DIAGNOSIS — J040 Acute laryngitis: Secondary | ICD-10-CM | POA: Diagnosis not present

## 2015-08-28 DIAGNOSIS — J301 Allergic rhinitis due to pollen: Secondary | ICD-10-CM | POA: Diagnosis not present

## 2015-08-28 DIAGNOSIS — R490 Dysphonia: Secondary | ICD-10-CM | POA: Diagnosis not present

## 2015-09-12 ENCOUNTER — Encounter: Payer: Self-pay | Admitting: Internal Medicine

## 2015-09-12 ENCOUNTER — Ambulatory Visit (INDEPENDENT_AMBULATORY_CARE_PROVIDER_SITE_OTHER): Payer: BLUE CROSS/BLUE SHIELD | Admitting: Internal Medicine

## 2015-09-12 VITALS — BP 95/57 | HR 59 | Ht 69.0 in | Wt 132.2 lb

## 2015-09-12 DIAGNOSIS — I471 Supraventricular tachycardia: Secondary | ICD-10-CM

## 2015-09-12 NOTE — Progress Notes (Signed)
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 Karsten Fells with [facility] and vascular center until 2010. In the past he said complaints of chest pain and shortness of breath but underwent workup including stress test and echocardiogram which was negative. He also had some lower extremity leg pain concerning for claudication but had normal arterial Dopplers. Recently he's had some left chest pain and shortness of breath. He also feels some discomfort from time to time and almost on a daily basis some fluttering or what described as palpitations. He saw his primary care provider for this who referred him back to our office. He does not have a lot of traditional cardiac risk factors. He denies hypertension, diabetes or significant family history of coronary disease. He reports he generally been thin most of his life and has a low blood pressure and low heart rate at rest. He was referred for a lexiscan stress test and monitor. The stress test was negative for ischemia with an EF of 54%. He wore the 48 hour monitor which demonstrated an episode of PSVT. This was less than 10 seconds however could've made him symptomatic. This could explain why been feeling some palpitations. It did not appear to be atrial fibrillation or atrial flutter.  09/12/2015  Lee Nelson returns today for follow-up. He's been taking his low-dose of Toprol but reports some occasional dizziness and fatigue with exertion. He has run a low normal blood pressure and I was concerned about it possibly getting lower with the beta blocker. He does note that his ectopy has improved on the beta blocker, but the trade off side effects do not seem to be worth it.  PMHx:  Past Medical History  Diagnosis Date  . Allergic rhinitis   . GERD (gastroesophageal reflux disease)   . Skin cancer   .  Early cataracts, bilateral   . Diverticulosis   . Mitral valve prolapse   . Kidney stones   . Gall stones   . Headache(784.0)   . MRSA (methicillin resistant Staphylococcus aureus) carrier   . Constipation   . Thrombocytopenia (HCC)   . Complication of anesthesia     " I had some coughing afterwards for a couple of days"  . Anginal pain (HCC)     pt states comes and goes pt states he feels is related to acid reflux or his gallbaldder issues occas uses aloe vera juice or calcium carbonate tab to relieve   . Pneumonia     hx of in childhood   . Shortness of breath     walking distances or climbing stairs   . Tuberculosis     pt states has been told he has been exposed in past; positive skin test in past   . Anxiety   . Urinary frequency   . Arthritis   . Tingling     hands and feet bilat     Past Surgical History  Procedure Laterality Date  . Tonsillectomy    . Hernia repair    . Appendectomy    . Colonoscopy w/ biopsies and polypectomy    . Nissen fundoplication    . Esophagogastroduodenoscopy N/A 12/09/2013    Procedure: ESOPHAGOGASTRODUODENOSCOPY (EGD);  Surgeon: Willis Modena, MD;  Location: [facility] ENDOSCOPY;  Service: Endoscopy;  Laterality: N/A;  . Cholecystectomy N/A 09/29/2014    Procedure: LAPAROSCOPIC CHOLECYSTECTOMY WITH  INTRAOPERATIVE CHOLANGIOGRAM;  Surgeon: Ovidio Kinavid Newman, MD;  Location: [facility] ORS;  Service: General;  Laterality: N/A;    FAMHx:  Family History  Problem Relation Age of Onset  . Parkinsonism Brother   . COPD Mother   . Allergies Mother   . Heart failure Mother   . COPD Father   . Stroke Father   . Cancer Maternal Grandfather   . Other Sister   . Suicidality Maternal Aunt     SOCHx:   reports that he has never smoked. He has never used smokeless tobacco. He reports that he drinks alcohol. He reports that he does not use illicit drugs.  ALLERGIES:  Allergies  Allergen Reactions  . Metoclopramide Hcl Other (See Comments)    REACTION:  "involuntary movements"  . Metronidazole Other (See Comments)    REACTION: no appetite, diarrhea after meal, decrease in weight  . Sulfamethoxazole-Trimethoprim Other (See Comments)    REACTION: "involuntary movements"    ROS: Pertinent items noted in HPI and remainder of comprehensive ROS otherwise negative.  HOME MEDS: Current Outpatient Prescriptions  Medication Sig Dispense Refill  . acetaminophen-codeine (TYLENOL #3) 300-30 MG per tablet Take 1 tablet by mouth every 6 (six) hours as needed for moderate pain. 30 tablet 0  . ALPRAZolam (XANAX) 0.25 MG tablet Take 0.0625-0.25 mg by mouth at bedtime as needed for sleep.     . Coenzyme Q10 (VITALINE COQ10) 300 MG WAFR Take 2 tablets by mouth daily.    . Cranberry 400 MG CAPS Take 400-1,200 mg by mouth every morning.     . Ginger 500 MG CAPS Take 500 mg by mouth daily. Take 1-2 times daily    . Gotu [place]ola, Centella asiatica, (GOTU KOLA PO) Take 500 mg by mouth daily. Take 1-2 times daily    . lansoprazole (PREVACID) 15 MG capsule Take 15 mg by mouth every morning.     . loratadine (CLARITIN) 10 MG tablet Take 10 mg by mouth every morning.    . metoprolol succinate (TOPROL XL) 25 MG 24 hr tablet Take 0.5 tablets (12.5 mg total) by mouth daily. 15 tablet 6  . Milk Thistle 175 MG CAPS Take 1 capsule by mouth daily.     . montelukast (SINGULAIR) 10 MG tablet Take 1 tablet (10 mg total) by mouth at bedtime. 30 tablet 5  . Multiple Vitamin (MULTIVITAMIN) capsule Take 1 capsule by mouth every morning.     Marland Kitchen. OMEGA-3 FAT AC-CHOLECALCIFEROL PO Take 1 capsule by mouth daily.    Marland Kitchen. OVER THE COUNTER MEDICATION Take 3 capsules by mouth 2 (two) times daily. Lion's mane 0.5 gram/capsule    . OVER THE COUNTER MEDICATION Take 1 tablet by mouth every morning. Bacopa - 750 mg    . OVER THE COUNTER MEDICATION Take 1 tablet by mouth daily with supper. Bone Strength    . OVER THE COUNTER MEDICATION Take 2 capsules by mouth every morning. Reparagen supplement      . OVER THE COUNTER MEDICATION Take 1 tablet by mouth daily as needed (for eye migraine). Mygra Few    . OVER THE COUNTER MEDICATION Take 100 mg by mouth 1 day or 1 dose. Magnesium;Take 2-3 daily with Bone Strength.    Marland Kitchen. OVER THE COUNTER MEDICATION Take 2 capsules by mouth daily. Panax Ginseng    . OVER THE COUNTER MEDICATION CocoaVia flavonoids    . Phosphatidylserine 100 MG CAPS Take 200 mg by mouth every morning.    . Probiotic Product (PROBIOTIC DAILY PO)  Take by mouth.    . sodium chloride (MURO 128) 5 % ophthalmic ointment Place 1 application into both eyes at bedtime.    . sodium chloride (MURO 128) 5 % ophthalmic solution Place 1 drop into both eyes 3 (three) times daily.    . THEANINE PO Take 2 tablets by mouth 2 (two) times daily.     . vitamin C (ASCORBIC ACID) 250 MG tablet Take 250 mg by mouth daily.     No current facility-administered medications for this visit.    LABS/IMAGING: No results found for this or any previous visit (from the past 48 hour(s)). No results found.  WEIGHTS: Wt Readings from Last 3 Encounters:  09/12/15 132 lb 3.2 oz (59.966 kg)  05/31/15 132 lb 4.8 oz (60.011 kg)  05/17/15 129 lb (58.514 kg)    VITALS: BP 95/57 mmHg  Pulse 59  Ht 5\' 9"  (1.753 m)  Wt 132 lb 3.2 oz (59.966 kg)  BMI 19.51 kg/m2  EXAM: Deferred  EKG: Deferred  ASSESSMENT: 1. Chest pain - low risk nuclear stress test with normal LV function 2. Dyspnea on exertion - stable 3. PSVT  PLAN: 1.   Lee Nelson Has noted an improvement in palpitations and no further PSVT on beta blocker, but blood pressure is accordingly low. I do not feel that he'll tolerate daily beta blocker due to symptoms of dizziness and some fatigue. I recommend discontinuing that and using it as needed for palpitations going forward. He can follow-up with me as needed but otherwise resume care with his primary care provider.  Thanks again for allowing me to participate in his care.  Chrystie Nose,  MD, [facility] Attending Cardiologist [facility]  Chrystie Nose 09/12/2015, 9:18 AM

## 2015-09-12 NOTE — Patient Instructions (Signed)
Dr. Rennis GoldenHilty has recommended that you take METOPROLOL SUCCINATE (TOPROL XL) as needed for palpitations.   Your physician recommends that you schedule a follow-up appointment as needed.

## 2015-10-05 DIAGNOSIS — L718 Other rosacea: Secondary | ICD-10-CM | POA: Diagnosis not present

## 2015-10-05 DIAGNOSIS — H2513 Age-related nuclear cataract, bilateral: Secondary | ICD-10-CM | POA: Diagnosis not present

## 2015-10-05 DIAGNOSIS — H0289 Other specified disorders of eyelid: Secondary | ICD-10-CM | POA: Diagnosis not present

## 2015-10-05 DIAGNOSIS — H1859 Other hereditary corneal dystrophies: Secondary | ICD-10-CM | POA: Diagnosis not present

## 2015-11-06 DIAGNOSIS — N3010 Interstitial cystitis (chronic) without hematuria: Secondary | ICD-10-CM | POA: Diagnosis not present

## 2015-11-06 DIAGNOSIS — N401 Enlarged prostate with lower urinary tract symptoms: Secondary | ICD-10-CM | POA: Diagnosis not present

## 2015-11-06 DIAGNOSIS — N3941 Urge incontinence: Secondary | ICD-10-CM | POA: Diagnosis not present

## 2015-11-06 DIAGNOSIS — N5201 Erectile dysfunction due to arterial insufficiency: Secondary | ICD-10-CM | POA: Diagnosis not present

## 2015-11-07 DIAGNOSIS — Z85828 Personal history of other malignant neoplasm of skin: Secondary | ICD-10-CM | POA: Diagnosis not present

## 2015-11-07 DIAGNOSIS — L821 Other seborrheic keratosis: Secondary | ICD-10-CM | POA: Diagnosis not present

## 2015-12-12 DIAGNOSIS — H2513 Age-related nuclear cataract, bilateral: Secondary | ICD-10-CM | POA: Diagnosis not present

## 2015-12-12 DIAGNOSIS — L718 Other rosacea: Secondary | ICD-10-CM | POA: Diagnosis not present

## 2015-12-12 DIAGNOSIS — H1859 Other hereditary corneal dystrophies: Secondary | ICD-10-CM | POA: Diagnosis not present

## 2015-12-12 DIAGNOSIS — H0289 Other specified disorders of eyelid: Secondary | ICD-10-CM | POA: Diagnosis not present

## 2015-12-15 DIAGNOSIS — Z23 Encounter for immunization: Secondary | ICD-10-CM | POA: Diagnosis not present

## 2016-01-30 DIAGNOSIS — H0289 Other specified disorders of eyelid: Secondary | ICD-10-CM | POA: Diagnosis not present

## 2016-01-30 DIAGNOSIS — H1859 Other hereditary corneal dystrophies: Secondary | ICD-10-CM | POA: Diagnosis not present

## 2016-01-30 DIAGNOSIS — L718 Other rosacea: Secondary | ICD-10-CM | POA: Diagnosis not present

## 2016-02-12 DIAGNOSIS — H8111 Benign paroxysmal vertigo, right ear: Secondary | ICD-10-CM | POA: Diagnosis not present

## 2016-02-12 DIAGNOSIS — K589 Irritable bowel syndrome without diarrhea: Secondary | ICD-10-CM | POA: Diagnosis not present

## 2016-02-12 DIAGNOSIS — H6121 Impacted cerumen, right ear: Secondary | ICD-10-CM | POA: Diagnosis not present

## 2016-02-14 DIAGNOSIS — N401 Enlarged prostate with lower urinary tract symptoms: Secondary | ICD-10-CM | POA: Diagnosis not present

## 2016-02-14 DIAGNOSIS — R351 Nocturia: Secondary | ICD-10-CM | POA: Diagnosis not present

## 2016-02-22 ENCOUNTER — Other Ambulatory Visit: Payer: Self-pay | Admitting: Pediatrics

## 2016-03-05 DIAGNOSIS — H1859 Other hereditary corneal dystrophies: Secondary | ICD-10-CM | POA: Diagnosis not present

## 2016-03-05 DIAGNOSIS — L718 Other rosacea: Secondary | ICD-10-CM | POA: Diagnosis not present

## 2016-03-05 DIAGNOSIS — H0289 Other specified disorders of eyelid: Secondary | ICD-10-CM | POA: Diagnosis not present

## 2016-03-06 DIAGNOSIS — Z0000 Encounter for general adult medical examination without abnormal findings: Secondary | ICD-10-CM | POA: Diagnosis not present

## 2016-03-06 DIAGNOSIS — M810 Age-related osteoporosis without current pathological fracture: Secondary | ICD-10-CM | POA: Diagnosis not present

## 2016-03-06 DIAGNOSIS — Z125 Encounter for screening for malignant neoplasm of prostate: Secondary | ICD-10-CM | POA: Diagnosis not present

## 2016-03-13 ENCOUNTER — Other Ambulatory Visit: Payer: Self-pay | Admitting: Gastroenterology

## 2016-03-13 ENCOUNTER — Ambulatory Visit
Admission: RE | Admit: 2016-03-13 | Discharge: 2016-03-13 | Disposition: A | Discharge status: Home / Self Care | Payer: BLUE CROSS/BLUE SHIELD | Source: Ambulatory Visit | Attending: Gastroenterology | Admitting: Gastroenterology

## 2016-03-13 DIAGNOSIS — R140 Abdominal distension (gaseous): Secondary | ICD-10-CM | POA: Diagnosis not present

## 2016-03-13 DIAGNOSIS — R109 Unspecified abdominal pain: Secondary | ICD-10-CM | POA: Diagnosis not present

## 2016-03-13 DIAGNOSIS — R198 Other specified symptoms and signs involving the digestive system and abdomen: Secondary | ICD-10-CM | POA: Diagnosis not present

## 2016-03-13 DIAGNOSIS — Z86010 Personal history of colonic polyps: Secondary | ICD-10-CM | POA: Diagnosis not present

## 2016-03-13 DIAGNOSIS — Z0000 Encounter for general adult medical examination without abnormal findings: Secondary | ICD-10-CM | POA: Diagnosis not present

## 2016-03-13 DIAGNOSIS — G629 Polyneuropathy, unspecified: Secondary | ICD-10-CM | POA: Diagnosis not present

## 2016-03-13 DIAGNOSIS — M4800 Spinal stenosis, site unspecified: Secondary | ICD-10-CM | POA: Diagnosis not present

## 2016-03-13 DIAGNOSIS — M810 Age-related osteoporosis without current pathological fracture: Secondary | ICD-10-CM | POA: Diagnosis not present

## 2016-03-13 DIAGNOSIS — R0789 Other chest pain: Secondary | ICD-10-CM | POA: Diagnosis not present

## 2016-03-17 DIAGNOSIS — Z1212 Encounter for screening for malignant neoplasm of rectum: Secondary | ICD-10-CM | POA: Diagnosis not present

## 2016-04-02 DIAGNOSIS — L308 Other specified dermatitis: Secondary | ICD-10-CM | POA: Diagnosis not present

## 2016-04-02 DIAGNOSIS — L218 Other seborrheic dermatitis: Secondary | ICD-10-CM | POA: Diagnosis not present

## 2016-04-02 DIAGNOSIS — L821 Other seborrheic keratosis: Secondary | ICD-10-CM | POA: Diagnosis not present

## 2016-05-12 DIAGNOSIS — N401 Enlarged prostate with lower urinary tract symptoms: Secondary | ICD-10-CM | POA: Diagnosis not present

## 2016-05-12 DIAGNOSIS — N3941 Urge incontinence: Secondary | ICD-10-CM | POA: Diagnosis not present

## 2016-05-12 DIAGNOSIS — N5201 Erectile dysfunction due to arterial insufficiency: Secondary | ICD-10-CM | POA: Diagnosis not present

## 2016-05-16 DIAGNOSIS — R509 Fever, unspecified: Secondary | ICD-10-CM | POA: Diagnosis not present

## 2016-05-19 DIAGNOSIS — R350 Frequency of micturition: Secondary | ICD-10-CM | POA: Diagnosis not present

## 2016-05-19 DIAGNOSIS — N401 Enlarged prostate with lower urinary tract symptoms: Secondary | ICD-10-CM | POA: Diagnosis not present

## 2016-05-19 DIAGNOSIS — N3941 Urge incontinence: Secondary | ICD-10-CM | POA: Diagnosis not present

## 2016-05-26 DIAGNOSIS — N3941 Urge incontinence: Secondary | ICD-10-CM | POA: Diagnosis not present

## 2016-05-26 DIAGNOSIS — N401 Enlarged prostate with lower urinary tract symptoms: Secondary | ICD-10-CM | POA: Diagnosis not present

## 2016-05-26 DIAGNOSIS — N5201 Erectile dysfunction due to arterial insufficiency: Secondary | ICD-10-CM | POA: Diagnosis not present

## 2016-06-13 DIAGNOSIS — N401 Enlarged prostate with lower urinary tract symptoms: Secondary | ICD-10-CM | POA: Diagnosis not present

## 2016-06-13 DIAGNOSIS — R351 Nocturia: Secondary | ICD-10-CM | POA: Diagnosis not present

## 2016-06-16 DIAGNOSIS — N401 Enlarged prostate with lower urinary tract symptoms: Secondary | ICD-10-CM | POA: Diagnosis not present

## 2016-06-16 DIAGNOSIS — N5201 Erectile dysfunction due to arterial insufficiency: Secondary | ICD-10-CM | POA: Diagnosis not present

## 2016-06-16 DIAGNOSIS — N3941 Urge incontinence: Secondary | ICD-10-CM | POA: Diagnosis not present

## 2016-06-16 DIAGNOSIS — R351 Nocturia: Secondary | ICD-10-CM | POA: Diagnosis not present

## 2016-06-20 DIAGNOSIS — R278 Other lack of coordination: Secondary | ICD-10-CM | POA: Diagnosis not present

## 2016-06-20 DIAGNOSIS — M6281 Muscle weakness (generalized): Secondary | ICD-10-CM | POA: Diagnosis not present

## 2016-06-20 DIAGNOSIS — N3941 Urge incontinence: Secondary | ICD-10-CM | POA: Diagnosis not present

## 2016-06-20 DIAGNOSIS — M62838 Other muscle spasm: Secondary | ICD-10-CM | POA: Diagnosis not present

## 2016-07-04 ENCOUNTER — Other Ambulatory Visit: Payer: Self-pay | Admitting: Urology

## 2016-07-04 DIAGNOSIS — D696 Thrombocytopenia, unspecified: Secondary | ICD-10-CM | POA: Diagnosis not present

## 2016-07-04 DIAGNOSIS — N401 Enlarged prostate with lower urinary tract symptoms: Secondary | ICD-10-CM | POA: Diagnosis not present

## 2016-07-04 DIAGNOSIS — Z01818 Encounter for other preprocedural examination: Secondary | ICD-10-CM | POA: Diagnosis not present

## 2016-07-07 DIAGNOSIS — K5900 Constipation, unspecified: Secondary | ICD-10-CM | POA: Diagnosis not present

## 2016-07-07 DIAGNOSIS — M6281 Muscle weakness (generalized): Secondary | ICD-10-CM | POA: Diagnosis not present

## 2016-07-07 DIAGNOSIS — M62838 Other muscle spasm: Secondary | ICD-10-CM | POA: Diagnosis not present

## 2016-07-07 DIAGNOSIS — R102 Pelvic and perineal pain: Secondary | ICD-10-CM | POA: Diagnosis not present

## 2016-07-08 DIAGNOSIS — H43399 Other vitreous opacities, unspecified eye: Secondary | ICD-10-CM | POA: Diagnosis not present

## 2016-07-08 DIAGNOSIS — H0289 Other specified disorders of eyelid: Secondary | ICD-10-CM | POA: Diagnosis not present

## 2016-07-08 DIAGNOSIS — H1859 Other hereditary corneal dystrophies: Secondary | ICD-10-CM | POA: Diagnosis not present

## 2016-07-08 DIAGNOSIS — L718 Other rosacea: Secondary | ICD-10-CM | POA: Diagnosis not present

## 2016-07-08 DIAGNOSIS — H35371 Puckering of macula, right eye: Secondary | ICD-10-CM | POA: Diagnosis not present

## 2016-07-21 DIAGNOSIS — M6281 Muscle weakness (generalized): Secondary | ICD-10-CM | POA: Diagnosis not present

## 2016-07-21 DIAGNOSIS — R102 Pelvic and perineal pain: Secondary | ICD-10-CM | POA: Diagnosis not present

## 2016-07-21 DIAGNOSIS — M62838 Other muscle spasm: Secondary | ICD-10-CM | POA: Diagnosis not present

## 2016-07-21 DIAGNOSIS — K5900 Constipation, unspecified: Secondary | ICD-10-CM | POA: Diagnosis not present

## 2016-07-28 DIAGNOSIS — L821 Other seborrheic keratosis: Secondary | ICD-10-CM | POA: Diagnosis not present

## 2016-07-28 DIAGNOSIS — I781 Nevus, non-neoplastic: Secondary | ICD-10-CM | POA: Diagnosis not present

## 2016-07-28 DIAGNOSIS — D1801 Hemangioma of skin and subcutaneous tissue: Secondary | ICD-10-CM | POA: Diagnosis not present

## 2016-07-28 DIAGNOSIS — D0439 Carcinoma in situ of skin of other parts of face: Secondary | ICD-10-CM | POA: Diagnosis not present

## 2016-08-06 DIAGNOSIS — H1859 Other hereditary corneal dystrophies: Secondary | ICD-10-CM | POA: Diagnosis not present

## 2016-08-06 DIAGNOSIS — H0289 Other specified disorders of eyelid: Secondary | ICD-10-CM | POA: Diagnosis not present

## 2016-08-06 DIAGNOSIS — L718 Other rosacea: Secondary | ICD-10-CM | POA: Diagnosis not present

## 2016-08-06 DIAGNOSIS — H35371 Puckering of macula, right eye: Secondary | ICD-10-CM | POA: Diagnosis not present

## 2016-08-07 ENCOUNTER — Encounter (HOSPITAL_BASED_OUTPATIENT_CLINIC_OR_DEPARTMENT_OTHER): Payer: Self-pay | Admitting: *Deleted

## 2016-08-07 DIAGNOSIS — K5900 Constipation, unspecified: Secondary | ICD-10-CM | POA: Diagnosis not present

## 2016-08-07 DIAGNOSIS — R102 Pelvic and perineal pain: Secondary | ICD-10-CM | POA: Diagnosis not present

## 2016-08-07 DIAGNOSIS — M6281 Muscle weakness (generalized): Secondary | ICD-10-CM | POA: Diagnosis not present

## 2016-08-07 DIAGNOSIS — M62838 Other muscle spasm: Secondary | ICD-10-CM | POA: Diagnosis not present

## 2016-08-11 ENCOUNTER — Encounter (HOSPITAL_BASED_OUTPATIENT_CLINIC_OR_DEPARTMENT_OTHER): Payer: Self-pay | Admitting: *Deleted

## 2016-08-11 NOTE — Progress Notes (Signed)
NPO AFTER MN.  ARRIVE AT 0730.  GETTING CBC AND BMET DONE THIS WEEK.  CURRENT EKG IN CHART AND EPIC.  PT STATES WOULD AFTER TO DRINK HALF GLASS OF WATER WITH PREVACID , SO HE WILL TAKE IT.  ALSO, STATES HE IS HARD IV STICK.  HIS WIFE IS DISABLED SO A FRIEND IS PICKING HIM UP.  PT REQUESTED SPINAL ANESTHESIA , DOES WANT GENERAL ANESTHESIA DUE TO COGNITIVE PROBLEMS AS YOU GET OLDER.

## 2016-08-12 DIAGNOSIS — N138 Other obstructive and reflux uropathy: Secondary | ICD-10-CM | POA: Diagnosis not present

## 2016-08-12 DIAGNOSIS — R3914 Feeling of incomplete bladder emptying: Secondary | ICD-10-CM | POA: Diagnosis not present

## 2016-08-12 DIAGNOSIS — N210 Calculus in bladder: Secondary | ICD-10-CM | POA: Diagnosis not present

## 2016-08-12 DIAGNOSIS — Z886 Allergy status to analgesic agent status: Secondary | ICD-10-CM | POA: Diagnosis not present

## 2016-08-12 DIAGNOSIS — R350 Frequency of micturition: Secondary | ICD-10-CM | POA: Diagnosis not present

## 2016-08-12 DIAGNOSIS — N401 Enlarged prostate with lower urinary tract symptoms: Secondary | ICD-10-CM | POA: Diagnosis not present

## 2016-08-12 DIAGNOSIS — Z79899 Other long term (current) drug therapy: Secondary | ICD-10-CM | POA: Diagnosis not present

## 2016-08-12 DIAGNOSIS — Z888 Allergy status to other drugs, medicaments and biological substances status: Secondary | ICD-10-CM | POA: Diagnosis not present

## 2016-08-12 DIAGNOSIS — R351 Nocturia: Secondary | ICD-10-CM | POA: Diagnosis not present

## 2016-08-12 DIAGNOSIS — Z85828 Personal history of other malignant neoplasm of skin: Secondary | ICD-10-CM | POA: Diagnosis not present

## 2016-08-12 DIAGNOSIS — R3912 Poor urinary stream: Secondary | ICD-10-CM | POA: Diagnosis not present

## 2016-08-12 DIAGNOSIS — N3941 Urge incontinence: Secondary | ICD-10-CM | POA: Diagnosis not present

## 2016-08-12 DIAGNOSIS — Z881 Allergy status to other antibiotic agents status: Secondary | ICD-10-CM | POA: Diagnosis not present

## 2016-08-12 LAB — BASIC METABOLIC PANEL
Anion gap: 7 (ref 5–15)
BUN: 18 mg/dL (ref 6–20)
CO2: 26 mmol/L (ref 22–32)
Calcium: 9.2 mg/dL (ref 8.9–10.3)
Chloride: 104 mmol/L (ref 101–111)
Creatinine, Ser: 0.68 mg/dL (ref 0.61–1.24)
GFR calc Af Amer: >60 mL/min (ref >60)
GFR calc non Af Amer: >60 mL/min (ref >60)
Glucose, Bld: 95 mg/dL (ref 65–99)
Potassium: 4.2 mmol/L (ref 3.5–5.1)
Sodium: 137 mmol/L (ref 135–145)

## 2016-08-12 LAB — CBC
HCT: 40.9 % (ref 39.0–52.0)
Hemoglobin: 14 g/dL (ref 13.0–17.0)
MCH: 32.3 pg (ref 26.0–34.0)
MCHC: 34.2 g/dL (ref 30.0–36.0)
MCV: 94.2 fL (ref 78.0–100.0)
Platelets: 117 10*3/uL — ABNORMAL LOW (ref 150–400)
RBC: 4.34 MIL/uL (ref 4.22–5.81)
RDW: 13 % (ref 11.5–15.5)
WBC: 6.8 10*3/uL (ref 4.0–10.5)

## 2016-08-17 NOTE — H&P (Signed)
-------------------------------------------------------------------------------- Lee Nelson. Lee Nelson  MRN: [id number]  PRIMARY CARE:  Lee Galas, MD  DOB: April 16, 1940, 76 year old Male  REFERRING:     PROVIDER:  Jethro Nelson, M.D.    LOCATION:  [facility] [phone]   --------------------------------------------------------------------------------   CC: I have an enlarged prostate (follow-up).  HPI: [name] is a 76 year-old male established patient who is here for an enlarged prostate follow-up evaluation.  He is currently on rapaflo and finasteride for the symptoms due to the enlarged prostate gland. He is not on new medications for symptoms of prostate enlargement.   He does not have an abnormal sensation when needing to urinate. He is not having problems getting his urine stream started. He does not have a good size and strength to his urinary stream. He is having problems with emptying his bladder well.   He has previously failed Rapaflo.17cc gland; PVR= 234cc      CC: I leak when I have the urge to urinate.  HPI: He does have problems getting to the bathroom in time after he has the urge to urinate. He has had an accident when he couldn't get to the bathroom in time . He has 2 episodes of urge incontinence per day. His symptoms have gotten worse over the last year.   He does not wear protective pads. He generally urinates every 2 hours in the daytime. He gets up at night to urinate 1-2 times. He is having problems with emptying his bladder well.     CC: I am having trouble with my erections.  HPI: He first stated noticing pain on approximately 10/30/2015. His symptoms did begin gradually. His symptoms have been worse over the last year.   He does have difficulties achieving an erection. He does have problems maintaining his erections. His erections are straight.     CC: Flow Symptoms  HPI: His urine stream is poor. He sometimes will hesitate  to urinate. He sometimes must strain to urinate. His stream does not stop and start. He does not feel empty after urination. The patient has been previously treated with medication. The patient has previously tried the following medications: Flomax and Proscar. The patient describes their response to previous medications as no benefit.     AUA Symptom Score: More than 50% of the time he has the sensation of not emptying his bladder completely when finished urinating. More than 50% of the time he has to urinate again fewer than two hours after he has finished urinating. Less than 50% of the time he has to start and stop again several times when he urinates. More than 50% of the time he finds it difficult to postpone urination. 50% of the time he has a weak urinary stream. Less than 20% of the time he has to push or strain to begin urination. He has to get up to urinate 2 times from the time he goes to bed until the time he gets up in the morning.   Calculated AUA Symptom Score: 20    QOL Score: He would feel mostly dissatisfied if he had to live with his urinary condition the way it is now for the rest of his life.   Calculated QOL Symptom Score: 4    ALLERGIES: Alpha Blocker - Other Reaction, Headache, stuffy nose Cipro TABS Flagyl TABS Gelnique GEL NSAIDs Reglan TABS Septra TABS Urelle TABS VESIcare TABS Zithromax PACK    MEDICATIONS: Proscar 5 mg tablet 1 tablet  PO Daily  ALPRAZolam 0.25 MG Oral Tablet Oral  Claritin TABS Oral  Cocopolo  Lansoprazole 30 mg capsule,delayed release Oral  Lion's Mane Mushroom  L-Theanine CAPS Oral  Mens Multivitamin Plus TABS Oral  Panax Ginseng 100 mg capsule Oral  Pshosphatidylserine  Serovera  Sinus Clear  Suntheanine     GU PSH: Complex cystometrogram, w/ void pressure and urethral pressure profile studies, any technique - 05/19/2016 Complex Uroflow - 05/19/2016 Emg surf Electrd - 05/19/2016 Inject For cystogram - 05/19/2016 Intrabd voidng  Press - 05/19/2016      PSH Notes: Dermatological Surgery  Destruction Of Malignant Lesion  Esophagogastric Fundoplasty Nissen Fundoplication,    NON-GU PSH: Appendectomy - 2007 Cholecystectomy (open) - 10/06/2014 Hernia Repair - 2007 Remove Tonsils - 2007    GU PMH: BPH w/LUTS - 05/26/2016, - 05/12/2016, - 11/06/2015, Benign localized hyperplasia of prostate with urinary obstruction, - 08/01/2015 ED, arterial insufficiency - 05/26/2016, - 05/12/2016, - 11/06/2015, Erectile dysfunction due to arterial insufficiency, - 03/13/2014 Urge incontinence - 05/26/2016, - 05/12/2016, - 11/06/2015, Urge incontinence of urine, - 03/27/2015 Interstitial Cystitis, chronic w/o hematuria (Stable) - 11/06/2015, Chronic interstitial cystitis without hematuria, - 03/27/2015 Kidney Stone, Nephrolithiasis - 08/01/2015 Nocturia, Nocturia - 08/01/2015 Dorsalgia, Unspec, Backache - 05/24/2015 Urinary Retention, Other retention of urine - 05/24/2015 Dysuria, Dysuria - 05/10/2015 Urinary Frequency, Urinary frequency - 09/27/2014 Inflammatory Disease Prostate, Unspec, Prostatitis - 2014 Lower abdominal pain, unspecified, Lower abdominal pain - 2014 Overactive bladder, Overactive bladder - 2014 Urinary Urgency, Urinary urgency - 2014      PMH Notes:  2007-05-26 13:21:24 - Note: Penile Pain  2006-03-20 09:46:21 - Note: Skin Cancer   NON-GU PMH: Other idiopathic peripheral autonomic neuropathy - 05/12/2016 Encounter for general adult medical examination without abnormal findings, Encounter for preventive health examination - 2014 Age-related osteoporosis without current pathological fracture, Osteoporosis - 2014 Muscle weakness (generalized), Muscle weakness - 2014 Other muscle spasm, Muscle spasm - 2014 Personal history of other specified conditions, History of heartburn - 2014    FAMILY HISTORY: Emphysema - Father, Mother Family Health Status Number - Mother Osteoporosis - Mother Parkinson's Disease - Brother   SOCIAL HISTORY:  Marital Status: Married Current Smoking Status: Patient has never smoked.  Does not drink caffeine. 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    REVIEW OF SYSTEMS:    GU Review Male:   Patient reports get up at night to urinate, penile pain, burning/ pain with urination, leakage of urine, frequent urination, and hard to postpone urination. Patient denies have to strain to urinate , trouble starting your stream, stream starts and stops, and erection problems.  Gastrointestinal (Upper):   Patient reports indigestion/ heartburn. Patient denies nausea and vomiting.  Gastrointestinal (Lower):   Patient reports constipation. Patient denies diarrhea.  Constitutional:   Patient reports weight loss and fatigue. Patient denies fever and night sweats.  Skin:   Patient reports skin rash/ lesion and itching.   Eyes:   Patient denies blurred vision and double vision.  Ears/ Nose/ Throat:   Patient reports sinus problems. Patient denies sore throat.  Hematologic/Lymphatic:   Patient denies swollen glands and easy bruising.  Cardiovascular:   Patient reports chest pains. Patient denies leg swelling.  Respiratory:   Patient reports shortness of breath. Patient denies cough.  Endocrine:   Patient denies excessive thirst.  Musculoskeletal:   Patient reports back pain and joint pain.   Neurological:   Patient denies headaches and dizziness.  Psychologic:   Patient denies depression and anxiety.  Notes: Reviewed previous review of systems 06/13/2016. No changes.    VITAL SIGNS:      06/16/2016 09:32 AM  BP 116/62 mmHg  Pulse 61 /min  Temperature 96.9 F / 36 C   GU PHYSICAL EXAMINATION:    Anus and Perineum: No hemorrhoids. No anal stenosis. No rectal fissure, no anal fissure. No edema, no dimple, no perineal tenderness, no anal tenderness.  Scrotum: No lesions. No edema. No cysts. No warts.  Epididymides: Right: no spermatocele, no masses, no cysts, no tenderness, no induration, no enlargement.  Left: no spermatocele, no masses, no cysts, no tenderness, no induration, no enlargement.  Testes: No tenderness, no swelling, no enlargement left testes. No tenderness, no swelling, no enlargement right testes. Normal location left testes. Normal location right testes. No mass, no cyst, no varicocele, no hydrocele left testes. No mass, no cyst, no varicocele, no hydrocele right testes.  Urethral Meatus: Normal size. No lesion, no wart, no discharge, no polyp. Normal location.  Penis: Circumcised, no warts, no cracks. No dorsal Peyronie's plaques, no left corporal Peyronie's plaques, no right corporal Peyronie's plaques, no scarring, no warts. No balanitis, no meatal stenosis.  Prostate: 40 gram or 2+ size. Left lobe normal consistency, right lobe normal consistency. Symmetrical lobes. No prostate nodule. Left lobe no tenderness, right lobe no tenderness.  Seminal Vesicles: Nonpalpable.  Sphincter Tone: Normal sphincter. No rectal tenderness. No rectal mass.    MULTI-SYSTEM PHYSICAL EXAMINATION:    Constitutional: Thin. No physical deformities. Normally developed. Good grooming.   Respiratory: No labored breathing, no use of accessory muscles.   Cardiovascular: Normal temperature, normal extremity pulses, no swelling, no varicosities.  Lymphatic: No enlargement of neck, axillae, groin.  Skin: No paleness, no jaundice, no cyanosis. No lesion, no ulcer, no rash.  Neurologic / Psychiatric: Oriented to time, oriented to place, oriented to person. No depression, no anxiety, no agitation.  Gastrointestinal: No mass, no tenderness, no rigidity, non obese abdomen.  Eyes: Normal conjunctivae. Normal eyelids.  Ears, Nose, Mouth, and Throat: Left ear no scars, no lesions, no masses. Right ear no scars, no lesions, no masses. Nose no scars, no lesions, no masses. Normal hearing. Normal lips.  Musculoskeletal: Normal gait and station of head and neck.     PAST DATA REVIEWED:  Source Of History:  Patient   Records Review:   Previous Patient Records   03/10/08 09/04/06 09/03/06 09/25/05 08/14/04 10/21/02  PSA  Total PSA 0.96  1.57  1.57  0.55  0.44  0.46   Free PSA  0.92    0.13    % Free PSA  58.6    29.5      06/16/16  Urinalysis  Urine Appearance Clear   Urine Color Yellow   Urine Glucose Neg   Urine Bilirubin Neg   Urine Ketones Neg   Urine Specific Gravity 1.015   Urine Blood Neg   Urine pH 5.5   Urine Protein Neg   Urine Urobilinogen 0.2   Urine Nitrites Neg   Urine Leukocyte Esterase Neg    PROCEDURES:         Flexible Cystoscopy - 52000  Risks, benefits, and some of the potential complications of the procedure were discussed at length with the patient including infection, bleeding, voiding discomfort, urinary retention, fever, chills, sepsis, and others. All questions were answered. Informed consent was obtained. Antibiotic prophylaxis was given. Sterile technique and intraurethral analgesia were used.   Meatus:  Normal size. Normal location. Normal condition.  Urethra:  No strictures.  External Sphincter:  Normal.  Verumontanum:  Normal.  Prostate:  Moderate hyperplasia. Non-obstructing.  Bladder Neck:  Moderate bladder neck contracture.  Ureteral Orifices:  Normal location. Normal size. Normal shape. Effluxed clear urine.  Bladder:  Moderate trabeculation. One small stone. No tumors. Normal mucosa.Cellules, Multiple small tics. Median lobe causing "ball-valve" effect"      The lower urinary tract was carefully examined. The procedure was well-tolerated and without complications. Antibiotic instructions were given. Instructions were given to call the office immediately for bloody urine, difficulty urinating, urinary retention, painful or frequent urination, fever, chills, nausea, vomiting or other illness. The patient stated that he understood these instructions and would comply with them.         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 He has an international prostate symptom score =20 ( N=7-10), despite several months of finasteride therapy, and has failed Rapaflo therapy as well. He complains of urinary frequency, urgency, urge incontinence, incomplete emptying(4/5), and weak stream, nocturia, and intermittency. His quality of life is 6/6 (unhappy).  In-depth review of systems notes that he has burning in his feet, consistent with peripheral neuropathy, and difficulty going from sitting to standing position from a chair, that he is beginning to drop things, that he believes he is beginning to become increasingly forgetful, and difficulty swallowing, and chronic constipation (seeing Dr. Dulce Sellar).  Rectal examination shows a 3+ benign prostate, and prostate ultrasound shows a small, 19 cc prostate, with some calcifications. Post void residual, however, was found to be 234 cc.   Clinical concerns were for development of neurologic disease, because of his various neurologic review of systems, and his family history of a brother dying secondary to Parkinson's disease. However, he has developed no focal signs or neurologic symptoms. The plan has been to obtain video urodynamics, and then  discuss case with Dr. Felipa Eth. Urodynamics is obtained to evaluate for possible hypotonic bladder, possible vesicoureteral reflux disease, possible urethral stricture, or elevated bladder neck. Note the patient has failed alpha-blocker therapy, and finasteride.   Video urodynamics showed a first sensation at  322 cc, with normal desire at 343 cc. The patient has a small bladder contraction of 11 cm of water, with no urinary leakage because he is able to inhibit this contraction. His maximum cystometric capacity is 458 cc.   Leak point pressure determination showed that he did not leak with a maximum abdominal pressure generation of 69 cm of water pressure.   Pressure flow study shows that he was able to void 129 cc, with a maximum flow rate of 3 cc/s (normal 25 cc/s) with a detrusor pressure at maximum flow of 85 cm of water pressure (normal 25 cm of water pressure) and a postvoid residual of 325 cc N = 50cc). He voids with an obstructed flow pattern.  After the study was  over the patient voided 100 cc more, leaving a final residual of 225 cc. Review of the Abrams-Griffith curves shows equivical obstruction. Fluoroscopic images show elevated bladder neck, with distended bladder, consistent with obstruction.  Cystoscopy shows ball-valve intra-vesicle bladder growth, with a small multi-faceted bladder stone. I do not think he will be a candidate for a Urolift procedure; rather, he will need cystolitholopaxy, and thulium laser of his prostate, with removal of his medium lobe.      PLAN:           Schedule Return Visit/Planned Activity: Next Available Appointment - Schedule Surgery  Return Notes: Needs pre-op clearance w/ Dr. Felipa Eth.          Document Letter(s):  Created for Patient: Clinical Summary         Notes:   cc: Dr. Felipa Eth   Needs pre op platelet count clearance.  Wants spinal if possible. Note hx of small oral pharanx if ET tube is needed.          The information contained in this medical record document is considered private and confidential patient information. This information can only be used for the medical diagnosis and/or medical services that are being provided by the patient's selected caregivers. This information can only be distributed outside of the patient's care if the  patient agrees and signs waivers of authorization for this information to be sent to an outside source or route.

## 2016-08-18 ENCOUNTER — Ambulatory Visit (HOSPITAL_BASED_OUTPATIENT_CLINIC_OR_DEPARTMENT_OTHER)
Admission: RE | Admit: 2016-08-18 | Discharge: 2016-08-18 | Disposition: A | Discharge status: Home / Self Care | Payer: BLUE CROSS/BLUE SHIELD | Source: Ambulatory Visit | Attending: Urology | Admitting: Urology

## 2016-08-18 ENCOUNTER — Encounter (HOSPITAL_BASED_OUTPATIENT_CLINIC_OR_DEPARTMENT_OTHER)
Admission: RE | Disposition: A | Discharge status: Home / Self Care | Payer: Self-pay | Source: Ambulatory Visit | Attending: Urology

## 2016-08-18 ENCOUNTER — Encounter (HOSPITAL_BASED_OUTPATIENT_CLINIC_OR_DEPARTMENT_OTHER): Payer: Self-pay | Admitting: *Deleted

## 2016-08-18 ENCOUNTER — Ambulatory Visit (HOSPITAL_BASED_OUTPATIENT_CLINIC_OR_DEPARTMENT_OTHER): Payer: BLUE CROSS/BLUE SHIELD | Admitting: Anesthesiology

## 2016-08-18 DIAGNOSIS — Z886 Allergy status to analgesic agent status: Secondary | ICD-10-CM | POA: Diagnosis not present

## 2016-08-18 DIAGNOSIS — Z79899 Other long term (current) drug therapy: Secondary | ICD-10-CM | POA: Insufficient documentation

## 2016-08-18 DIAGNOSIS — R350 Frequency of micturition: Secondary | ICD-10-CM | POA: Diagnosis not present

## 2016-08-18 DIAGNOSIS — R3914 Feeling of incomplete bladder emptying: Secondary | ICD-10-CM | POA: Diagnosis not present

## 2016-08-18 DIAGNOSIS — Z888 Allergy status to other drugs, medicaments and biological substances status: Secondary | ICD-10-CM | POA: Diagnosis not present

## 2016-08-18 DIAGNOSIS — Z85828 Personal history of other malignant neoplasm of skin: Secondary | ICD-10-CM | POA: Diagnosis not present

## 2016-08-18 DIAGNOSIS — N210 Calculus in bladder: Secondary | ICD-10-CM | POA: Diagnosis not present

## 2016-08-18 DIAGNOSIS — M810 Age-related osteoporosis without current pathological fracture: Secondary | ICD-10-CM | POA: Diagnosis not present

## 2016-08-18 DIAGNOSIS — N401 Enlarged prostate with lower urinary tract symptoms: Secondary | ICD-10-CM | POA: Insufficient documentation

## 2016-08-18 DIAGNOSIS — Z881 Allergy status to other antibiotic agents status: Secondary | ICD-10-CM | POA: Insufficient documentation

## 2016-08-18 DIAGNOSIS — N400 Enlarged prostate without lower urinary tract symptoms: Secondary | ICD-10-CM

## 2016-08-18 DIAGNOSIS — R3912 Poor urinary stream: Secondary | ICD-10-CM | POA: Insufficient documentation

## 2016-08-18 DIAGNOSIS — N138 Other obstructive and reflux uropathy: Secondary | ICD-10-CM | POA: Insufficient documentation

## 2016-08-18 DIAGNOSIS — R351 Nocturia: Secondary | ICD-10-CM | POA: Insufficient documentation

## 2016-08-18 DIAGNOSIS — K219 Gastro-esophageal reflux disease without esophagitis: Secondary | ICD-10-CM | POA: Diagnosis not present

## 2016-08-18 DIAGNOSIS — N3941 Urge incontinence: Secondary | ICD-10-CM | POA: Insufficient documentation

## 2016-08-18 HISTORY — PX: THULIUM LASER TURP (TRANSURETHRAL RESECTION OF PROSTATE): SHX6744

## 2016-08-18 HISTORY — DX: Migraine, unspecified, not intractable, without status migrainosus: G43.909

## 2016-08-18 HISTORY — DX: Dry eye syndrome of bilateral lacrimal glands: H04.123

## 2016-08-18 HISTORY — DX: Personal history of colonic polyps: Z86.010

## 2016-08-18 HISTORY — DX: Other allergic rhinitis: J30.89

## 2016-08-18 HISTORY — DX: Personal history of adenomatous and serrated colon polyps: Z86.0101

## 2016-08-18 HISTORY — DX: Personal history of urinary calculi: Z87.442

## 2016-08-18 HISTORY — PX: STONE EXTRACTION WITH BASKET: SHX5318

## 2016-08-18 HISTORY — DX: Other constipation: K59.09

## 2016-08-18 HISTORY — DX: Other diseases of vocal cords: J38.3

## 2016-08-18 HISTORY — DX: Presence of spectacles and contact lenses: Z97.3

## 2016-08-18 HISTORY — DX: Personal history of in-situ neoplasm of skin: Z86.007

## 2016-08-18 HISTORY — DX: Benign prostatic hyperplasia without lower urinary tract symptoms: N40.0

## 2016-08-18 HISTORY — DX: Other seasonal allergic rhinitis: J30.2

## 2016-08-18 HISTORY — DX: Other allergy status, other than to drugs and biological substances: Z91.09

## 2016-08-18 HISTORY — DX: Diverticulosis of large intestine without perforation or abscess without bleeding: K57.30

## 2016-08-18 HISTORY — DX: Calculus in bladder: N21.0

## 2016-08-18 SURGERY — THULIUM LASER TURP (TRANSURETHRAL RESECTION OF PROSTATE)
Anesthesia: Spinal

## 2016-08-18 MED ORDER — EPHEDRINE 5 MG/ML INJ
INTRAVENOUS | Status: AC
  Filled 2016-08-18: qty 10

## 2016-08-18 MED ORDER — CEFAZOLIN SODIUM-DEXTROSE 2-4 GM/100ML-% IV SOLN
INTRAVENOUS | Status: AC
  Filled 2016-08-18: qty 100

## 2016-08-18 MED ORDER — CEFAZOLIN SODIUM-DEXTROSE 2-4 GM/100ML-% IV SOLN
2.0000 g | INTRAVENOUS | Status: AC
  Administered 2016-08-18: 2 g via INTRAVENOUS
  Filled 2016-08-18: qty 100

## 2016-08-18 MED ORDER — FENTANYL CITRATE (PF) 100 MCG/2ML IJ SOLN
25.0000 ug | INTRAMUSCULAR | Status: DC | PRN
  Filled 2016-08-18: qty 1

## 2016-08-18 MED ORDER — EPHEDRINE SULFATE 50 MG/ML IJ SOLN
INTRAMUSCULAR | Status: DC | PRN
  Administered 2016-08-18 (×3): 10 mg via INTRAVENOUS

## 2016-08-18 MED ORDER — BELLADONNA ALKALOIDS-OPIUM 16.2-60 MG RE SUPP
RECTAL | Status: DC | PRN
  Administered 2016-08-18: 1 via RECTAL

## 2016-08-18 MED ORDER — LACTATED RINGERS IV SOLN
INTRAVENOUS | Status: DC
  Administered 2016-08-18 (×2): via INTRAVENOUS
  Filled 2016-08-18: qty 1000

## 2016-08-18 MED ORDER — ONDANSETRON HCL 4 MG/2ML IJ SOLN
INTRAMUSCULAR | Status: DC | PRN
  Administered 2016-08-18: 4 mg via INTRAVENOUS

## 2016-08-18 MED ORDER — PHENAZOPYRIDINE HCL 200 MG PO TABS: 200.0000 mg | ORAL_TABLET | Freq: Three times a day (TID) | ORAL | 0 refills | Status: DC | PRN

## 2016-08-18 MED ORDER — PROPOFOL 10 MG/ML IV BOLUS
INTRAVENOUS | Status: AC
  Filled 2016-08-18: qty 20

## 2016-08-18 MED ORDER — SODIUM CHLORIDE 0.9 % IR SOLN
Status: DC | PRN
Start: 2016-08-18 — End: 2016-08-18
  Administered 2016-08-18: 15000 mL

## 2016-08-18 MED ORDER — PROPOFOL 500 MG/50ML IV EMUL
INTRAVENOUS | Status: AC
Start: 2016-08-18 — End: 2016-08-18
  Filled 2016-08-18: qty 50

## 2016-08-18 MED ORDER — LIDOCAINE 2% (20 MG/ML) 5 ML SYRINGE
INTRAMUSCULAR | Status: AC
  Filled 2016-08-18: qty 5

## 2016-08-18 MED ORDER — BELLADONNA ALKALOIDS-OPIUM 16.2-60 MG RE SUPP
RECTAL | Status: AC
  Filled 2016-08-18: qty 1

## 2016-08-18 MED ORDER — PROMETHAZINE HCL 25 MG/ML IJ SOLN
6.2500 mg | INTRAMUSCULAR | Status: DC | PRN
  Filled 2016-08-18: qty 1

## 2016-08-18 MED ORDER — ACETAMINOPHEN 10 MG/ML IV SOLN
INTRAVENOUS | Status: AC
  Filled 2016-08-18: qty 100

## 2016-08-18 MED ORDER — ACETAMINOPHEN 10 MG/ML IV SOLN
INTRAVENOUS | Status: DC | PRN
  Administered 2016-08-18: 1000 mg via INTRAVENOUS

## 2016-08-18 MED ORDER — TRAMADOL-ACETAMINOPHEN 37.5-325 MG PO TABS: 1.0000 | ORAL_TABLET | Freq: Four times a day (QID) | ORAL | 0 refills | Status: DC | PRN

## 2016-08-18 MED ORDER — PROPOFOL 500 MG/50ML IV EMUL
INTRAVENOUS | Status: DC | PRN
  Administered 2016-08-18: 25 ug/kg/min via INTRAVENOUS

## 2016-08-18 MED ORDER — LIDOCAINE HCL (CARDIAC) 20 MG/ML IV SOLN
INTRAVENOUS | Status: DC | PRN
  Administered 2016-08-18: 60 mg via INTRAVENOUS

## 2016-08-18 MED ORDER — FENTANYL CITRATE (PF) 100 MCG/2ML IJ SOLN
INTRAMUSCULAR | Status: DC | PRN
  Administered 2016-08-18 (×2): 25 ug via INTRAVENOUS

## 2016-08-18 MED ORDER — BUPIVACAINE IN DEXTROSE 0.75-8.25 % IT SOLN
INTRATHECAL | Status: DC | PRN
  Administered 2016-08-18: 1.6 mL via INTRATHECAL

## 2016-08-18 MED ORDER — BACITRACIN-NEOMYCIN-POLYMYXIN 400-5-5000 EX OINT: 1.0000 "application " | TOPICAL_OINTMENT | Freq: Two times a day (BID) | CUTANEOUS | 0 refills | Status: DC

## 2016-08-18 MED ORDER — FENTANYL CITRATE (PF) 100 MCG/2ML IJ SOLN
INTRAMUSCULAR | Status: AC
  Filled 2016-08-18: qty 2

## 2016-08-18 MED ORDER — KETOROLAC TROMETHAMINE 30 MG/ML IJ SOLN
INTRAMUSCULAR | Status: AC
  Filled 2016-08-18: qty 1

## 2016-08-18 SURGICAL SUPPLY — 30 items
BAG DRAIN URO-CYSTO SKYTR STRL (DRAIN) ×2 IMPLANT
BAG DRN UROCATH (DRAIN) ×1
BAG URINE DRAINAGE (UROLOGICAL SUPPLIES) ×2 IMPLANT
BASKET ZERO TIP NITINOL 2.4FR (BASKET) ×2 IMPLANT
BSKT STON RTRVL ZERO TP 2.4FR (BASKET) ×1
CATH HEMA 3WAY 30CC 22FR COUDE (CATHETERS) ×2 IMPLANT
CATH HEMA 3WAY 30CC 24FR COUDE (CATHETERS) ×2 IMPLANT
CATH HEMA 3WAY 30CC 24FR RND (CATHETERS) IMPLANT
CLOTH BEACON ORANGE TIMEOUT ST (SAFETY) ×2 IMPLANT
ELECT BIVAP BIPO 22/24 DONUT (ELECTROSURGICAL)
ELECT LOOP MED HF 24F 12D (CUTTING LOOP) IMPLANT
ELECTRD BIVAP BIPO 22/24 DONUT (ELECTROSURGICAL) IMPLANT
GLOVE BIO SURGEON STRL SZ7.5 (GLOVE) ×2 IMPLANT
GLOVE BIOGEL PI IND STRL 7.0 (GLOVE) ×2 IMPLANT
GLOVE BIOGEL PI INDICATOR 7.0 (GLOVE) ×2
GLOVE ECLIPSE 7.0 STRL STRAW (GLOVE) ×2 IMPLANT
GOWN STRL REUS W/ TWL XL LVL3 (GOWN DISPOSABLE) IMPLANT
GOWN STRL REUS W/TWL LRG LVL3 (GOWN DISPOSABLE) ×2 IMPLANT
GOWN STRL REUS W/TWL XL LVL3 (GOWN DISPOSABLE) ×2 IMPLANT
HOLDER FOLEY CATH W/STRAP (MISCELLANEOUS) ×2 IMPLANT
IV NS IRRIG 3000ML ARTHROMATIC (IV SOLUTION) ×10 IMPLANT
IV SET EXTENSION GRAVITY 40 LF (IV SETS) ×2 IMPLANT
KIT RM TURNOVER CYSTO AR (KITS) ×2 IMPLANT
LASER REVOLIX PROCEDURE (MISCELLANEOUS) ×2 IMPLANT
LOOP CUT BIPOLAR 24F LRG (ELECTROSURGICAL) IMPLANT
MANIFOLD NEPTUNE II (INSTRUMENTS) ×2 IMPLANT
PACK CYSTO (CUSTOM PROCEDURE TRAY) ×2 IMPLANT
SYR 30ML LL (SYRINGE) IMPLANT
SYRINGE IRR TOOMEY STRL 70CC (SYRINGE) ×2 IMPLANT
TUBE CONNECTING 12X1/4 (SUCTIONS) ×4 IMPLANT

## 2016-08-18 NOTE — Transfer of Care (Signed)
Immediate Anesthesia Transfer of Care Note  Patient: Lee Nelson  Procedure(s) Performed: Procedure(s) (LRB): THULIUM LASER BLADDER NECK INCISION AND BLADDER STONE REMOVAL (N/A) STONE EXTRACTION WITH BASKET (N/A)  Patient Location: PACU  Anesthesia Type: General  Level of Consciousness: awake, sedated, patient cooperative and responds to stimulation  Airway & Oxygen Therapy: Patient Spontanous Breathing and Patient connected to [place] O2   Post-op Assessment: Report given to PACU RN, Post -op Vital signs reviewed and stable and Patient moving all extremities / level T 8-10  Post vital signs: Reviewed and stable  Complications: No apparent anesthesia complications

## 2016-08-18 NOTE — Discharge Instructions (Addendum)
Post transurethral resection of the prostate (TURP) instructions  Your recent prostate surgery requires very special post hospital care. Despite the fact that no skin incisions were used the area around the prostate incision is quite raw and is covered with a scab to promote healing and prevent bleeding. Certain cautions are needed to assure that the scab is not disturbed of the next 2-3 weeks while the healing proceeds.  Because the raw surface in your prostate and the irritating effects of urine you may expect frequency of urination and/or urgency (a stronger desire to urinate) and perhaps even getting up at night more often. This will usually resolve or improve slowly over the healing period. You may see some blood in your urine over the first 6 weeks. Do not be alarmed, even if the urine was clear for a while. Get off your feet and drink lots of fluids until clearing occurs. If you start to pass clots or don't improve call us.  Diet:  You may return to your normal diet immediately. Because of the raw surface of your bladder, alcohol, spicy foods, foods high in acid and drinks with caffeine may cause irritation or frequency and should be used in moderation. To keep your urine flowing freely and avoid constipation, drink plenty of fluids during the day (8-10 glasses). Tip: Avoid cranberry juice because it is very acidic.  Activity:  Your physical activity doesn't need to be restricted. However, if you are very active, you may see some blood in the urine. We suggest that you reduce your activity under the circumstances until the bleeding has stopped.  Bowels:  It is important to keep your bowels regular during the postoperative period. Straining with bowel movements can cause bleeding. A bowel movement every other day is reasonable. Use a mild laxative if needed, such as milk of magnesia 2-3 tablespoons, or 2 Dulcolax tablets. Call if you continue to have problems. If you had been taking narcotics  for pain, before, during or after your surgery, you may be constipated. Take a laxative if necessary.  Medication:  You should resume your pre-surgery medications unless told not to. In addition you may be given an antibiotic to prevent or treat infection. Antibiotics are not always necessary. All medication should be taken as prescribed until the bottles are finished unless you are having an unusual reaction to one of the drugs.     Problems you should report to [place]:  a. Fever greater than 101F. b. Heavy bleeding, or clots (see notes above about blood in urine). c. Inability to urinate. d. Drug reactions (hives, rash, nausea, vomiting, diarrhea). e. Severe burning or pain with urination that is not improving.  REMOVE Traction tomorrow AM.  Push liquids    Post Anesthesia Home Care Instructions  Activity: Get plenty of rest for the remainder of the day. A responsible individual must stay with you for 24 hours following the procedure.  For the next 24 hours, DO NOT: -Drive a car -Advertising copywriter -Drink alcoholic beverages -Take any medication unless instructed by your physician -Make any legal decisions or sign important papers.  Meals: Start with liquid foods such as gelatin or soup. Progress to regular foods as tolerated. Avoid greasy, spicy, heavy foods. If nausea and/or vomiting occur, drink only clear liquids until the nausea and/or vomiting subsides. Call your physician if vomiting continues.  Special Instructions/Symptoms: Your throat may feel dry or sore from the anesthesia or the breathing tube placed in your throat during surgery. If this causes  discomfort, gargle with warm salt water. The discomfort should disappear within 24 hours.  If you had a scopolamine patch placed behind your ear for the management of post- operative nausea and/or vomiting:  1. The medication in the patch is effective for 72 hours, after which it should be removed.  Wrap patch in a tissue  and discard in the trash. Wash hands thoroughly with soap and water. 2. You may remove the patch earlier than 72 hours if you experience unpleasant side effects which may include dry mouth, dizziness or visual disturbances. 3. Avoid touching the patch. Wash your hands with soap and water after contact with the patch.   Regional Anesthesia Blocks  1. Numbness or the inability to move the "blocked" extremity may last from 3-48 hours after placement. The length of time depends on the medication injected and your individual response to the medication. If the numbness is not going away after 48 hours, call your surgeon.  2. The extremity that is blocked will need to be protected until the numbness is gone and the  Strength has returned. Because you cannot feel it, you will need to take extra care to avoid injury. Because it may be weak, you may have difficulty moving it or using it. You may not know what position it is in without looking at it while the block is in effect.  3. For blocks in the legs and feet, returning to weight bearing and walking needs to be done carefully. You will need to wait until the numbness is entirely gone and the strength has returned. You should be able to move your leg and foot normally before you try and bear weight or walk. You will need someone to be with you when you first try to ensure you do not fall and possibly risk injury.  4. Bruising and tenderness at the needle site are common side effects and will resolve in a few days.  5. Persistent numbness or new problems with movement should be communicated to the surgeon or the [facility] [phone] [facility] [phone]). Indwelling Urinary Catheter Care, Adult Take good care of your catheter to keep it working and to prevent problems. How to wear your catheter Attach your catheter to your leg with tape (adhesive tape) or a leg strap. Make sure it is not too tight. If you use tape,  remove any bits of tape that are already on the catheter. How to wear a drainage bag You should have:  A large overnight bag.  A small leg bag. Overnight Bag  You may wear the overnight bag at any time. Always keep the bag below the level of your bladder but off the floor. When you sleep, put a clean plastic bag in a wastebasket. Then hang the bag inside the wastebasket. Leg Bag  Never wear the leg bag at night. Always wear the leg bag below your knee. Keep the leg bag secure with a leg strap or tape. How to care for your skin  Clean the skin around the catheter at least once every day.  Shower every day. Do not take baths.  Put creams, lotions, or ointments on your genital area only as told by your doctor.  Do not use powders, sprays, or lotions on your genital area. How to clean your catheter and your skin 1. Wash your hands with soap and water. 2. Wet a washcloth in warm water and gentle (mild) soap. 3. Use the washcloth to clean the  skin where the catheter enters your body. Clean downward and wipe away from the catheter in small circles. Do not wipe toward the catheter. 4. Pat the area dry with a clean towel. Make sure to clean off all soap. How to care for your drainage bags Empty your drainage bag when it is ?- full or at least 2-3 times a day. Replace your drainage bag once a month or sooner if it starts to smell bad or look dirty. Do not clean your drainage bag unless told by your doctor. Emptying a drainage bag   Supplies Needed  Rubbing alcohol.  Gauze pad or cotton ball.  Tape or a leg strap. Steps 1. Wash your hands with soap and water. 2. Separate (detach) the bag from your leg. 3. Hold the bag over the toilet or a clean container. Keep the bag below your hips and bladder. This stops pee (urine) from going back into the tube. 4. Open the pour spout at the bottom of the bag. 5. Empty the pee into the toilet or container. Do not let the pour spout touch any  surface. 6. Put rubbing alcohol on a gauze pad or cotton ball. 7. Use the gauze pad or cotton ball to clean the pour spout. 8. Close the pour spout. 9. Attach the bag to your leg with tape or a leg strap. 10. Wash your hands. Changing a drainage bag  Supplies Needed  Alcohol wipes.  A clean drainage bag.  Adhesive tape or a leg strap. Steps 1. Wash your hands with soap and water. 2. Separate the dirty bag from your leg. 3. Pinch the rubber catheter with your fingers so that pee does not spill out. 4. Separate the catheter tube from the drainage tube where these tubes connect (at the connection valve). Do not let the tubes touch any surface. 5. Clean the end of the catheter tube with an alcohol wipe. Use a different alcohol wipe to clean the end of the drainage tube. 6. Connect the catheter tube to the drainage tube of the clean bag. 7. Attach the new bag to the leg with adhesive tape or a leg strap. 8. Wash your hands. How to prevent infection and other problems  Never pull on your catheter or try to remove it. Pulling can damage tissue in your body.  Always wash your hands before and after touching your catheter.  If a leg strap gets wet, replace it with a dry one.  Drink enough fluids to keep your pee clear or pale yellow, or as told by your doctor.  Do not let the drainage bag or tubing touch the floor.  Wear cotton underwear.  If you are male, wipe from front to back after you poop (have a bowel movement).  Check on the catheter often to make sure it works and the tubing is not twisted. Get help if:  Your pee is cloudy.  Your pee smells unusually bad.  Your pee is not draining into the bag.  Your tube gets clogged.  Your catheter starts to leak.  Your bladder feels full. Get help right away if:  You have redness, swelling, or pain where the catheter enters your body.  You have fluid, pus, or a bad smell coming from the area where the catheter enters your  body.  The area where the catheter enters your body feels warm.  You have a fever.  You have pain in your:  Stomach (abdomen).  Legs.  Lower back.  Bladder.  You see blood fill the catheter.  Your pee is pink or red.  You feel sick to your stomach (nauseous).  You throw up (vomit).  You have chills.  Your catheter gets pulled out. This information is not intended to replace advice given to you by your health care provider. Make sure you discuss any questions you have with your health care provider. Document Released: 07/12/2012 Document Revised: 02/13/2016 Document Reviewed: 08/30/2013 Elsevier Interactive Patient Education  2017 [employer]

## 2016-08-18 NOTE — Anesthesia Procedure Notes (Signed)
Spinal  Patient location during procedure: OR Start time: 08/18/2016 8:49 AM End time: 08/18/2016 8:49 AM Staffing Anesthesiologist: ,  Performed: anesthesiologist  Preanesthetic Checklist Completed: patient identified, site marked, surgical consent, pre-op evaluation, timeout performed, IV checked, risks and benefits discussed and monitors and equipment checked Spinal Block Patient position: sitting Prep: Betadine Patient monitoring: heart rate, continuous pulse ox and blood pressure Injection technique: single-shot Needle Needle type: Spinocan  Needle gauge: 22 G Needle length: 9 cm Additional Notes Expiration date of kit checked and confirmed. Patient tolerated procedure well, without complications.       

## 2016-08-18 NOTE — Anesthesia Preprocedure Evaluation (Signed)
Anesthesia Evaluation  Patient identified by MRN, date of birth, ID band Patient awake    Reviewed: Allergy & Precautions, NPO status , Patient's Chart, lab work & pertinent test results  History of Anesthesia Complications (+) DIFFICULT AIRWAY  Airway Mallampati: II  TM Distance: >3 FB Neck ROM: Limited    Dental no notable dental hx.    Pulmonary neg pulmonary ROS,    Pulmonary exam normal breath sounds clear to auscultation       Cardiovascular negative cardio ROS Normal cardiovascular exam Rhythm:Regular Rate:Normal     Neuro/Psych negative neurological ROS  negative psych ROS   GI/Hepatic negative GI ROS, Neg liver ROS,   Endo/Other  negative endocrine ROS  Renal/GU negative Renal ROS  negative genitourinary   Musculoskeletal negative musculoskeletal ROS (+)   Abdominal   Peds negative pediatric ROS (+)  Hematology negative hematology ROS (+)   Anesthesia Other Findings   Reproductive/Obstetrics negative OB ROS                             Anesthesia Physical Anesthesia Plan  ASA: II  Anesthesia Plan: Spinal   Post-op Pain Management:    Induction: Intravenous  Airway Management Planned: Simple Face Mask  Additional Equipment:   Intra-op Plan:   Post-operative Plan:   Informed Consent: I have reviewed the patients History and Physical, chart, labs and discussed the procedure including the risks, benefits and alternatives for the proposed anesthesia with the patient or authorized representative who has indicated his/her understanding and acceptance.   Dental advisory given  Plan Discussed with: CRNA and Surgeon  Anesthesia Plan Comments:         Anesthesia Quick Evaluation

## 2016-08-18 NOTE — Interval H&P Note (Signed)
History and Physical Interval Note:  08/18/2016 8:09 AM  Lee Nelson  has presented today for surgery, with the diagnosis of BPH  The various methods of treatment have been discussed with the patient and family. After consideration of risks, benefits and other options for treatment, the patient has consented to  Procedure(s): THULIUM LASER BLADDER NECK INCISION AND BLADDER STONE REMOVAL (N/A) STONE EXTRACTION WITH BASKET (N/A) as a surgical intervention .  The patient's history has been reviewed, patient examined, no change in status, stable for surgery.  I have reviewed the patient's chart and labs.  Questions were answered to the patient's satisfaction.     Lee Nelson Goals: open bladder outlet to allow low pressure voiding, and relieve obstructive voiding Likelihood of success: Excellent. Pt will have Spinal anesthesia ( choice); as outpatient procedure. Foley out in 2 days.  Alternate Rx: Continue on Rapaflo and finasteride with elevated voiding pressure 2ndary ball-valve median lobe-prostate.  Disability: Catheter for 2 days, no driving for 3-5 days. No anticoagulation as long as possible, but ok if necessary for next 6 weeks.                              minimum pain med, or antibiotic.

## 2016-08-18 NOTE — Anesthesia Postprocedure Evaluation (Signed)
Anesthesia Post Note  Patient: Lee Nelson  Procedure(s) Performed: Procedure(s) (LRB): THULIUM LASER BLADDER NECK INCISION AND BLADDER STONE REMOVAL (N/A) STONE EXTRACTION WITH BASKET (N/A)  Patient location during evaluation: PACU Anesthesia Type: Spinal Level of consciousness: oriented and awake and alert Pain management: pain level controlled Vital Signs Assessment: post-procedure vital signs reviewed and stable Respiratory status: spontaneous breathing, respiratory function stable and patient connected to nasal cannula oxygen Cardiovascular status: blood pressure returned to baseline and stable Postop Assessment: no headache and no backache Anesthetic complications: no       Last Vitals:  Vitals:   08/18/16 1015 08/18/16 1030  BP: 114/62 (!) 117/55  Pulse: 69 69  Resp: 14 14  Temp: (!) 35.8 C     Last Pain:  Vitals:   08/18/16 0757  TempSrc:   PainSc: 2                  Jamesmichael Shadd S

## 2016-08-18 NOTE — Op Note (Signed)
Pre-operative diagnosis :  BPH, Bladder stone  Postoperative diagnosis: Same  Operation: Cystourethroscopy,  basket extraction of 1cm spiculated bladder stone;  Staged Thulium laser of bladder neck, median lobe and right lateral lobe of the prostate.   Surgeon:  Kathie Rhodes. Patsi Sears, MD  First assistant:  None  Anesthesia:   Spinal  Anesthesiologist: Eilene Ghazi MD  Preparation: After appropriate preanesthesia, the patient was brought to the operative room, placed on the operating table in the right lateral decubitus position, where spinal anesthetic was introduced. He was replaced in the dorsal lithotomy position where the pubis was prepped with Betadine solution and draped in the usual fashion. The arm band was double checked. The history was double checked.  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 He has an international prostate symptom score =20 ( N=7-10), despite several months of finasteride therapy, and has failed Rapaflo therapy as well. He complains of urinary frequency, urgency, urge incontinence, incomplete emptying(4/5), and weak stream, nocturia, and intermittency. His quality of life is 6/6 (unhappy).  In-depth review of systems notes that he has burning in his feet, consistent with peripheral neuropathy, and difficulty going from sitting to standing position from a chair, that he is beginning to drop things, that he believes he is beginning to become increasingly forgetful, and difficulty swallowing, and chronic constipation (seeing Dr. Dulce Sellar).  Rectal examination shows a 3+ benign prostate, and prostate ultrasound shows a small, 19 cc prostate, with some calcifications. Post void residual, however, was found to be 234 cc.   Clinical concerns were for development of neurologic disease, because of his various neurologic review of systems, and his family history of a brother dying secondary to  Parkinson's disease. However, he has developed no focal signs or neurologic symptoms. The plan has been to obtain video urodynamics, and then  discuss case with Dr. Felipa Eth. Urodynamics is obtained to evaluate for possible hypotonic bladder, possible vesicoureteral reflux disease, possible urethral stricture, or elevated bladder neck. Note the patient has failed alpha-blocker therapy, and finasteride.   Video urodynamics showed a first sensation at 322 cc, with normal desire at 343 cc. The patient has a small bladder contraction of 11 cm of water, with no urinary leakage because he is able to inhibit this contraction. His maximum cystometric capacity is 458 cc.   Leak point pressure determination showed that he did not leak with a maximum abdominal pressure generation of 69 cm of water pressure.   Pressure flow study shows that he was able to void 129 cc, with a maximum flow rate of 3 cc/s (normal 25 cc/s) with a detrusor pressure at maximum flow of 85 cm of water pressure (normal 25 cm of water pressure) and a postvoid residual of 325 cc N = 50cc). He voids with an obstructed flow pattern.  After the study was over the patient voided 100 cc more, leaving a final residual of 225 cc. Review of the Abrams-Griffith curves shows equivical obstruction. Fluoroscopic images show elevated bladder neck, with distended bladder, consistent with obstruction.  Cystoscopy shows ball-valve intra-vesicle bladder growth, with a small multi-faceted bladder stone. I do not think he will be a candidate for a Urolift procedure; rather, he will need cystolitholopaxy, and thulium laser of his prostate, with removal of his medium lobe.      Statement of  Likelihood of Success: Excellent.   TIME-OUT :  observed.  Procedure:  cystourethroscopy was accomplished, showing no evidence of urethral stricture disease, and normal Vero. Bilobar BPH was noted, with elevated bladder neck and enlarged median lobe, as noted in the  office. A multi-spiculated bladder stone was identified on the right side the bladder, measuring 1 cm. This was basket extracted with the 0 tip basket, atraumatically. No other bladder stone was identified. The remaining bladder mucosa showed severe trabeculation with cellule formation, saccule formation, but no definite bladder diverticular formation. There was no evidence of bladder cancer. The trigone was normal, and clear reflux was seen from the ureteral orifice ease.  The prosthetic urethra was short, and an 800  laser fiber was used, with laser energy of 110 setting. Beginning at 7:00, a trench was developed from the bladder neck to the Vero. The enlarged median lobe was then approached with a laser, allowing vaporization of the median lobe, and allowing vaporization of the right lateral lobe, which fell into the bladder outlet. Following this, I felt that the bladder outlet was sufficiently opened, and that the left lateral lobe did not necessarily need vaporization. Therefore, the bladder itself was irrigated, and was noted to be without gross hematuria. A 22 French hematuria catheter was passed. The 3-way irrigation port was closed. Mild traction was needed to keep the urine clear. It will be removed in 24 hours. The catheter will plan to be removed in 3-4 days.  The patient tolerated the anesthetic well, and was transported to the recovery room in excellent condition. The stone was taken for further identification.

## 2016-08-19 ENCOUNTER — Encounter (HOSPITAL_BASED_OUTPATIENT_CLINIC_OR_DEPARTMENT_OTHER): Payer: Self-pay | Admitting: Urology

## 2016-08-21 DIAGNOSIS — R3915 Urgency of urination: Secondary | ICD-10-CM | POA: Diagnosis not present

## 2016-08-21 DIAGNOSIS — R350 Frequency of micturition: Secondary | ICD-10-CM | POA: Diagnosis not present

## 2016-08-27 DIAGNOSIS — R350 Frequency of micturition: Secondary | ICD-10-CM | POA: Diagnosis not present

## 2016-08-27 DIAGNOSIS — R3915 Urgency of urination: Secondary | ICD-10-CM | POA: Diagnosis not present

## 2016-09-01 NOTE — Addendum Note (Signed)
Addendum  created 09/01/16 1436 by Sinthia Karabin, MD   Sign clinical note    

## 2016-09-01 NOTE — Anesthesia Postprocedure Evaluation (Signed)
Anesthesia Post Note  Patient: Lee Nelson  Procedure(s) Performed: Procedure(s) (LRB): THULIUM LASER BLADDER NECK INCISION AND BLADDER STONE REMOVAL (N/A) STONE EXTRACTION WITH BASKET (N/A)     Anesthesia Post Evaluation  Last Vitals:  Vitals:   08/18/16 1300 08/18/16 1323  BP: (!) 105/55 (!) 112/40  Pulse: 69 65  Resp:  16  Temp:  36.6 C    Last Pain:  Vitals:   08/18/16 1323  TempSrc: Oral  PainSc:                  Tinley Rought S

## 2016-09-08 DIAGNOSIS — R102 Pelvic and perineal pain: Secondary | ICD-10-CM | POA: Diagnosis not present

## 2016-09-08 DIAGNOSIS — M6281 Muscle weakness (generalized): Secondary | ICD-10-CM | POA: Diagnosis not present

## 2016-09-08 DIAGNOSIS — M62838 Other muscle spasm: Secondary | ICD-10-CM | POA: Diagnosis not present

## 2016-09-08 DIAGNOSIS — K5900 Constipation, unspecified: Secondary | ICD-10-CM | POA: Diagnosis not present

## 2016-09-09 DIAGNOSIS — H35371 Puckering of macula, right eye: Secondary | ICD-10-CM | POA: Diagnosis not present

## 2016-09-09 DIAGNOSIS — H0289 Other specified disorders of eyelid: Secondary | ICD-10-CM | POA: Diagnosis not present

## 2016-09-09 DIAGNOSIS — L718 Other rosacea: Secondary | ICD-10-CM | POA: Diagnosis not present

## 2016-09-09 DIAGNOSIS — H1859 Other hereditary corneal dystrophies: Secondary | ICD-10-CM | POA: Diagnosis not present

## 2016-09-09 DIAGNOSIS — H43399 Other vitreous opacities, unspecified eye: Secondary | ICD-10-CM | POA: Diagnosis not present

## 2016-09-10 DIAGNOSIS — Z881 Allergy status to other antibiotic agents status: Secondary | ICD-10-CM | POA: Diagnosis not present

## 2016-09-10 DIAGNOSIS — Z882 Allergy status to sulfonamides status: Secondary | ICD-10-CM | POA: Diagnosis not present

## 2016-09-10 DIAGNOSIS — Z888 Allergy status to other drugs, medicaments and biological substances status: Secondary | ICD-10-CM | POA: Diagnosis not present

## 2016-09-10 DIAGNOSIS — D0439 Carcinoma in situ of skin of other parts of face: Secondary | ICD-10-CM | POA: Diagnosis not present

## 2016-09-11 DIAGNOSIS — M4800 Spinal stenosis, site unspecified: Secondary | ICD-10-CM | POA: Diagnosis not present

## 2016-09-11 DIAGNOSIS — Z1389 Encounter for screening for other disorder: Secondary | ICD-10-CM | POA: Diagnosis not present

## 2016-09-11 DIAGNOSIS — K219 Gastro-esophageal reflux disease without esophagitis: Secondary | ICD-10-CM | POA: Diagnosis not present

## 2016-09-11 DIAGNOSIS — G6289 Other specified polyneuropathies: Secondary | ICD-10-CM | POA: Diagnosis not present

## 2016-09-11 DIAGNOSIS — N401 Enlarged prostate with lower urinary tract symptoms: Secondary | ICD-10-CM | POA: Diagnosis not present

## 2016-09-17 DIAGNOSIS — N401 Enlarged prostate with lower urinary tract symptoms: Secondary | ICD-10-CM | POA: Diagnosis not present

## 2016-09-23 DIAGNOSIS — R102 Pelvic and perineal pain: Secondary | ICD-10-CM | POA: Diagnosis not present

## 2016-09-23 DIAGNOSIS — M6281 Muscle weakness (generalized): Secondary | ICD-10-CM | POA: Diagnosis not present

## 2016-09-23 DIAGNOSIS — K5900 Constipation, unspecified: Secondary | ICD-10-CM | POA: Diagnosis not present

## 2016-09-23 DIAGNOSIS — M62838 Other muscle spasm: Secondary | ICD-10-CM | POA: Diagnosis not present

## 2016-09-24 DIAGNOSIS — R300 Dysuria: Secondary | ICD-10-CM | POA: Diagnosis not present

## 2016-09-24 DIAGNOSIS — N401 Enlarged prostate with lower urinary tract symptoms: Secondary | ICD-10-CM | POA: Diagnosis not present

## 2016-09-29 DIAGNOSIS — R3915 Urgency of urination: Secondary | ICD-10-CM | POA: Diagnosis not present

## 2016-09-29 DIAGNOSIS — M6281 Muscle weakness (generalized): Secondary | ICD-10-CM | POA: Diagnosis not present

## 2016-09-29 DIAGNOSIS — K5900 Constipation, unspecified: Secondary | ICD-10-CM | POA: Diagnosis not present

## 2016-09-29 DIAGNOSIS — M62838 Other muscle spasm: Secondary | ICD-10-CM | POA: Diagnosis not present

## 2016-10-08 DIAGNOSIS — R3915 Urgency of urination: Secondary | ICD-10-CM | POA: Diagnosis not present

## 2016-10-08 DIAGNOSIS — R102 Pelvic and perineal pain: Secondary | ICD-10-CM | POA: Diagnosis not present

## 2016-10-08 DIAGNOSIS — M62838 Other muscle spasm: Secondary | ICD-10-CM | POA: Diagnosis not present

## 2016-10-08 DIAGNOSIS — R350 Frequency of micturition: Secondary | ICD-10-CM | POA: Diagnosis not present

## 2016-10-08 DIAGNOSIS — M6281 Muscle weakness (generalized): Secondary | ICD-10-CM | POA: Diagnosis not present

## 2016-10-13 DIAGNOSIS — Z881 Allergy status to other antibiotic agents status: Secondary | ICD-10-CM | POA: Diagnosis not present

## 2016-10-13 DIAGNOSIS — H01003 Unspecified blepharitis right eye, unspecified eyelid: Secondary | ICD-10-CM | POA: Diagnosis not present

## 2016-10-13 DIAGNOSIS — H0289 Other specified disorders of eyelid: Secondary | ICD-10-CM | POA: Diagnosis not present

## 2016-10-13 DIAGNOSIS — Z888 Allergy status to other drugs, medicaments and biological substances status: Secondary | ICD-10-CM | POA: Diagnosis not present

## 2016-10-13 DIAGNOSIS — N189 Chronic kidney disease, unspecified: Secondary | ICD-10-CM | POA: Diagnosis not present

## 2016-10-13 DIAGNOSIS — L718 Other rosacea: Secondary | ICD-10-CM | POA: Diagnosis not present

## 2016-10-13 DIAGNOSIS — H43813 Vitreous degeneration, bilateral: Secondary | ICD-10-CM | POA: Diagnosis not present

## 2016-10-13 DIAGNOSIS — H01006 Unspecified blepharitis left eye, unspecified eyelid: Secondary | ICD-10-CM | POA: Diagnosis not present

## 2016-10-13 DIAGNOSIS — H2513 Age-related nuclear cataract, bilateral: Secondary | ICD-10-CM | POA: Diagnosis not present

## 2016-10-13 DIAGNOSIS — H1859 Other hereditary corneal dystrophies: Secondary | ICD-10-CM | POA: Diagnosis not present

## 2016-10-13 DIAGNOSIS — Z882 Allergy status to sulfonamides status: Secondary | ICD-10-CM | POA: Diagnosis not present

## 2016-10-16 DIAGNOSIS — M62838 Other muscle spasm: Secondary | ICD-10-CM | POA: Diagnosis not present

## 2016-10-16 DIAGNOSIS — R102 Pelvic and perineal pain: Secondary | ICD-10-CM | POA: Diagnosis not present

## 2016-10-16 DIAGNOSIS — R350 Frequency of micturition: Secondary | ICD-10-CM | POA: Diagnosis not present

## 2016-10-16 DIAGNOSIS — M6281 Muscle weakness (generalized): Secondary | ICD-10-CM | POA: Diagnosis not present

## 2016-10-20 DIAGNOSIS — D0439 Carcinoma in situ of skin of other parts of face: Secondary | ICD-10-CM | POA: Diagnosis not present

## 2016-10-28 DIAGNOSIS — R350 Frequency of micturition: Secondary | ICD-10-CM | POA: Diagnosis not present

## 2016-10-28 DIAGNOSIS — R3912 Poor urinary stream: Secondary | ICD-10-CM | POA: Diagnosis not present

## 2016-10-28 DIAGNOSIS — R8271 Bacteriuria: Secondary | ICD-10-CM | POA: Diagnosis not present

## 2016-10-28 DIAGNOSIS — N401 Enlarged prostate with lower urinary tract symptoms: Secondary | ICD-10-CM | POA: Diagnosis not present

## 2016-10-29 DIAGNOSIS — H0289 Other specified disorders of eyelid: Secondary | ICD-10-CM | POA: Diagnosis not present

## 2016-10-29 DIAGNOSIS — G43109 Migraine with aura, not intractable, without status migrainosus: Secondary | ICD-10-CM | POA: Diagnosis not present

## 2016-10-29 DIAGNOSIS — L718 Other rosacea: Secondary | ICD-10-CM | POA: Diagnosis not present

## 2016-10-29 DIAGNOSIS — H1859 Other hereditary corneal dystrophies: Secondary | ICD-10-CM | POA: Diagnosis not present

## 2016-11-04 DIAGNOSIS — R3912 Poor urinary stream: Secondary | ICD-10-CM | POA: Diagnosis not present

## 2016-11-04 DIAGNOSIS — M6281 Muscle weakness (generalized): Secondary | ICD-10-CM | POA: Diagnosis not present

## 2016-11-04 DIAGNOSIS — R42 Dizziness and giddiness: Secondary | ICD-10-CM | POA: Diagnosis not present

## 2016-11-04 DIAGNOSIS — M62838 Other muscle spasm: Secondary | ICD-10-CM | POA: Diagnosis not present

## 2016-11-04 DIAGNOSIS — R102 Pelvic and perineal pain: Secondary | ICD-10-CM | POA: Diagnosis not present

## 2016-11-04 DIAGNOSIS — G43909 Migraine, unspecified, not intractable, without status migrainosus: Secondary | ICD-10-CM | POA: Diagnosis not present

## 2016-11-04 DIAGNOSIS — M5412 Radiculopathy, cervical region: Secondary | ICD-10-CM | POA: Diagnosis not present

## 2016-11-04 DIAGNOSIS — Z681 Body mass index (BMI) 19 or less, adult: Secondary | ICD-10-CM | POA: Diagnosis not present

## 2016-11-05 ENCOUNTER — Other Ambulatory Visit: Payer: Self-pay | Admitting: Internal Medicine

## 2016-11-05 DIAGNOSIS — G43909 Migraine, unspecified, not intractable, without status migrainosus: Secondary | ICD-10-CM

## 2016-11-05 DIAGNOSIS — R42 Dizziness and giddiness: Secondary | ICD-10-CM

## 2016-11-05 DIAGNOSIS — M5412 Radiculopathy, cervical region: Secondary | ICD-10-CM

## 2016-11-11 ENCOUNTER — Ambulatory Visit
Admission: RE | Admit: 2016-11-11 | Discharge: 2016-11-11 | Disposition: A | Discharge status: Home / Self Care | Payer: BLUE CROSS/BLUE SHIELD | Source: Ambulatory Visit | Attending: Internal Medicine | Admitting: Internal Medicine

## 2016-11-11 DIAGNOSIS — R42 Dizziness and giddiness: Secondary | ICD-10-CM

## 2016-11-11 DIAGNOSIS — M5412 Radiculopathy, cervical region: Secondary | ICD-10-CM

## 2016-11-11 DIAGNOSIS — G43909 Migraine, unspecified, not intractable, without status migrainosus: Secondary | ICD-10-CM

## 2016-11-11 DIAGNOSIS — R51 Headache: Secondary | ICD-10-CM | POA: Diagnosis not present

## 2016-11-11 MED ORDER — GADOBENATE DIMEGLUMINE 529 MG/ML IV SOLN
10.0000 mL | Freq: Once | INTRAVENOUS | Status: AC | PRN
  Administered 2016-11-11: 10 mL via INTRAVENOUS

## 2016-11-18 DIAGNOSIS — M62838 Other muscle spasm: Secondary | ICD-10-CM | POA: Diagnosis not present

## 2016-11-18 DIAGNOSIS — R102 Pelvic and perineal pain: Secondary | ICD-10-CM | POA: Diagnosis not present

## 2016-11-18 DIAGNOSIS — R350 Frequency of micturition: Secondary | ICD-10-CM | POA: Diagnosis not present

## 2016-11-18 DIAGNOSIS — M6281 Muscle weakness (generalized): Secondary | ICD-10-CM | POA: Diagnosis not present

## 2016-11-27 DIAGNOSIS — M62838 Other muscle spasm: Secondary | ICD-10-CM | POA: Diagnosis not present

## 2016-11-27 DIAGNOSIS — R350 Frequency of micturition: Secondary | ICD-10-CM | POA: Diagnosis not present

## 2016-11-27 DIAGNOSIS — M6281 Muscle weakness (generalized): Secondary | ICD-10-CM | POA: Diagnosis not present

## 2016-11-27 DIAGNOSIS — R102 Pelvic and perineal pain: Secondary | ICD-10-CM | POA: Diagnosis not present

## 2016-12-02 DIAGNOSIS — Z85828 Personal history of other malignant neoplasm of skin: Secondary | ICD-10-CM | POA: Diagnosis not present

## 2016-12-02 DIAGNOSIS — L218 Other seborrheic dermatitis: Secondary | ICD-10-CM | POA: Diagnosis not present

## 2016-12-02 DIAGNOSIS — D485 Neoplasm of uncertain behavior of skin: Secondary | ICD-10-CM | POA: Diagnosis not present

## 2016-12-02 DIAGNOSIS — L720 Epidermal cyst: Secondary | ICD-10-CM | POA: Diagnosis not present

## 2016-12-02 DIAGNOSIS — D225 Melanocytic nevi of trunk: Secondary | ICD-10-CM | POA: Diagnosis not present

## 2016-12-25 DIAGNOSIS — N401 Enlarged prostate with lower urinary tract symptoms: Secondary | ICD-10-CM | POA: Diagnosis not present

## 2016-12-25 DIAGNOSIS — R351 Nocturia: Secondary | ICD-10-CM | POA: Diagnosis not present

## 2016-12-25 DIAGNOSIS — R3912 Poor urinary stream: Secondary | ICD-10-CM | POA: Diagnosis not present

## 2016-12-27 DIAGNOSIS — Z23 Encounter for immunization: Secondary | ICD-10-CM | POA: Diagnosis not present

## 2016-12-30 ENCOUNTER — Encounter (INDEPENDENT_AMBULATORY_CARE_PROVIDER_SITE_OTHER): Payer: Self-pay

## 2016-12-30 ENCOUNTER — Ambulatory Visit (INDEPENDENT_AMBULATORY_CARE_PROVIDER_SITE_OTHER): Payer: BLUE CROSS/BLUE SHIELD | Admitting: Diagnostic Neuroimaging

## 2016-12-30 ENCOUNTER — Encounter: Payer: Self-pay | Admitting: Diagnostic Neuroimaging

## 2016-12-30 VITALS — Ht 69.0 in | Wt 125.0 lb

## 2016-12-30 DIAGNOSIS — G959 Disease of spinal cord, unspecified: Secondary | ICD-10-CM | POA: Diagnosis not present

## 2016-12-30 DIAGNOSIS — H532 Diplopia: Secondary | ICD-10-CM | POA: Diagnosis not present

## 2016-12-30 DIAGNOSIS — M542 Cervicalgia: Secondary | ICD-10-CM

## 2016-12-30 DIAGNOSIS — G43109 Migraine with aura, not intractable, without status migrainosus: Secondary | ICD-10-CM | POA: Diagnosis not present

## 2016-12-30 MED ORDER — AMITRIPTYLINE HCL 10 MG PO TABS: 10.0000 mg | ORAL_TABLET | Freq: Every day | ORAL | 6 refills | Status: DC

## 2016-12-30 NOTE — Patient Instructions (Signed)
  MIGRAINE WITH AURA - start low dose amitriptyline for migraine / insomnia treatment  WORD FINDING DIFFICULTY - consider braincheck or other cognitive testing - brain healthy activities reviewed

## 2016-12-30 NOTE — Progress Notes (Signed)
[name]  PATIENT: Lee Nelson DOB: Dec 08, 1940  REFERRING CLINICIAN: Avva HISTORY FROM: patient  REASON FOR VISIT: new problem / existing patient   HISTORICAL  CHIEF COMPLAINT:  Chief Complaint  Patient presents with  . Migraine    rm 6, new consult, "eye migraine per eye dr, avg maybe one weekly; dizziness, feel unsteady in morning and w/changing positions sometimes"  . Dizziness    HISTORY OF PRESENT ILLNESS:   UPDATE 12/30/16: Since last visit, continues with word finding diff. Also with intermittent right sided headaches, intermittent double vision, and neck pain. Avg 1-2 days per week of headache.   UPDATE 03/10/14: Since last visit, no more double vision attacks. Notes some work finding diff. Still with stress from his wife's quadriplegia and 24 hour care at home.   PRIOR HPI (09/07/13): 76 year old right-handed male with history of neck pain, back pain, skin cancer, reflux disease, osteoporosis, anxiety, fibromyalgia, migraine, here for evaluation of headaches and transient double vision. Previous evaluated patient in 2012 for right arm numbness. Patient reports over ten-year history of intermittent neck pain. Patient has some degenerative cervical spine disease managed conservatively. Patient has one to 2 year history of intermittent headaches mainly in the right parietal and right occipital region. Some pain radiates from the neck up to the head. No nausea or vomiting. No photophobia or phonophobia. Sometimes he has sharp shooting brief pains. He rates pain severity 2-3/10. More recently headache symptoms have been more constant. No visual symptoms associated with headache. Separately patient has intermittent visual disturbance where he sees "swimming lines" in front of him, but these are not associated with headache. 10 days ago patient was at a meeting talking to some people, when he had sudden onset of vertical double vision lasting for one to 2  minutes. He had mild queasiness sensation. No vomiting. He had mild equilibrium problem without falling out of his chair. No slurred speech. No numbness or tingling. Symptoms resolved on their own. No headaches associated with this.   REVIEW OF SYSTEMS: Full 14 system review of systems performed and negative except: insomnia mild memory loss anxiety feeling cold trouble swallowing hearing loss constipation.  ALLERGIES: Allergies  Allergen Reactions  . Flagyl [Metronidazole] Other (See Comments)    REACTION: no appetite, diarrhea after meal, decrease in weight  . Metoclopramide Hcl Other (See Comments)    REACTION: "involuntary movements"  . Septra [Sulfamethoxazole-Trimethoprim] Other (See Comments)    REACTION: "involuntary movements"    HOME MEDICATIONS: Outpatient Medications Prior to Visit  Medication Sig Dispense Refill  . ALPRAZolam (XANAX) 0.25 MG tablet Take 0.175-0.25 mg by mouth 2 (two) times daily as needed for sleep.     . Cholecalciferol (VITAMIN D-3) 1000 units CAPS Take 1 capsule by mouth daily.    . Cranberry 400 MG CAPS Take 400-1,200 mg by mouth every morning.     . finasteride (PROSCAR) 5 MG tablet Take 5 mg by mouth every evening.    . loratadine (CLARITIN) 10 MG tablet Take 10 mg by mouth every morning.    . Menaquinone-7 (VITAMIN K2 PO) Take 120-240 mg by mouth daily.    . Milk Thistle 175 MG CAPS Take 1 capsule by mouth daily.     . montelukast (SINGULAIR) 10 MG tablet Take 1 tablet (10 mg total) by mouth at bedtime. 30 tablet 5  . Multiple Vitamin (MULTIVITAMIN) capsule Take 1 capsule by mouth every morning.     Marland Kitchen OVER THE COUNTER MEDICATION Take 3  capsules by mouth 2 (two) times daily. Lion's mane 0.5 gram/capsule    . OVER THE COUNTER MEDICATION Take 2 capsules by mouth every morning. Reparagen supplement    . OVER THE COUNTER MEDICATION Take 1 tablet by mouth daily as needed (for eye migraine). Mygra Few    . OVER THE COUNTER MEDICATION CocoaVia flavonoids      . OVER THE COUNTER MEDICATION Aloe, Mucilaginous, Polysaccheride, L-glutinine--- takes x2  each twice daily for constipation    . OVER THE COUNTER MEDICATION Tranquil Sleep (5HDP, melatonin)  Takes bedtime as needed    . OVER THE COUNTER MEDICATION Sinus Clear Herb PO-- takes as needed    . Phosphatidylserine 100 MG CAPS Take 200 mg by mouth every morning.    . Probiotic Product (PROBIOTIC DAILY PO) Take by mouth.    . sodium chloride (MURO 128) 5 % ophthalmic ointment Place 1 application into both eyes at bedtime.    . sodium chloride (MURO 128) 5 % ophthalmic solution Place 1 drop into both eyes 3 (three) times daily.    . THEANINE PO Take 2 tablets by mouth 2 (two) times daily.     . traMADol-acetaminophen (ULTRACET) 37.5-325 MG tablet Take 1 tablet by mouth every 6 (six) hours as needed. 16 tablet 0  . vitamin C (ASCORBIC ACID) 250 MG tablet Take 250 mg by mouth daily.    . lansoprazole (PREVACID) 15 MG capsule Take 15 mg by mouth daily as needed.     . neomycin-bacitracin-polymyxin (NEOSPORIN) ointment Apply 1 application topically 2 (two) times daily. Apply to penile meatus 2x/day 15 g 0  . OMEGA-3 FAT AC-CHOLECALCIFEROL PO Take 1 capsule by mouth daily.    . phenazopyridine (PYRIDIUM) 200 MG tablet Take 1 tablet (200 mg total) by mouth 3 (three) times daily as needed for pain (for urinary burning, spasm). 30 tablet 0   No facility-administered medications prior to visit.     PAST MEDICAL HISTORY: Past Medical History:  Diagnosis Date  . Anxiety   . Arthritis   . Bladder calculus   . BPH (benign prostatic hyperplasia)   . Chronic constipation   . Complication of anesthesia    " I had some coughing afterwards for a couple of days"--  per pt "perfers spinal anesthesia since general anesthesia congnitive issues when older"  . Diverticulosis of colon   . Dry eye syndrome of both eyes   . Environmental allergies   . GERD (gastroesophageal reflux disease)    occasional  .  History of adenomatous polyp of colon    08/ 2004  . History of kidney stones   . History of squamous cell carcinoma in situ (SCCIS) of skin    s/p  excision 2013 facial areas and 06/ 2016 nose  . Migraine    eye migraine occasional  . Seasonal and perennial allergic rhinitis   . Thrombocytopenia (HCC)   . Tingling    hands and feet bilat , intermittantly-- per pt has lumbar bulging disk  . Urinary frequency   . Vocal fold atrophy    dysphonia-- per pt has to drink large amount of water to take even on pill  . Wears glasses     PAST SURGICAL HISTORY: Past Surgical History:  Procedure Laterality Date  . APPENDECTOMY  child  . CARDIOVASCULAR STRESS TEST  05-17-2015  dr hilty   Low risk nuclear study w/ no ischemia/  normal LV function and wall motion , stress ef 54% (lvef 45-54%)  .  CHOLECYSTECTOMY N/A 09/29/2014   Procedure: LAPAROSCOPIC CHOLECYSTECTOMY WITH INTRAOPERATIVE CHOLANGIOGRAM;  Surgeon: Ovidio Kin, MD;  Location: [facility];  Service: General;  Laterality: N/A;  . COLONOSCOPY  last one 09-06-2010  . ESOPHAGOGASTRODUODENOSCOPY N/A 12/09/2013   Procedure: ESOPHAGOGASTRODUODENOSCOPY (EGD);  Surgeon: Willis Modena, MD;  Location: [facility];  Service: Endoscopy;  Laterality: N/A;  . EXTRACORPOREAL SHOCK WAVE LITHOTRIPSY  yrs ago  . INGUINAL HERNIA REPAIR Left child  . NISSEN FUNDOPLICATION  1980's   open  . STONE EXTRACTION WITH BASKET N/A 08/18/2016   Procedure: STONE EXTRACTION WITH BASKET;  Surgeon: Jethro Bolus, MD;  Location: [facility];  Service: Urology;  Laterality: N/A;  . THULIUM LASER TURP (TRANSURETHRAL RESECTION OF PROSTATE) N/A 08/18/2016   Procedure: THULIUM LASER BLADDER NECK INCISION AND BLADDER STONE REMOVAL;  Surgeon: Jethro Bolus, MD;  [facility];  Service: Urology;  Laterality: N/A;  . TONSILLECTOMY  child  . TRANSTHORACIC ECHOCARDIOGRAM  11/18/2010   grade 1 diastolic dysfunction, ef 55-60%/  trivial  MR and TR/ mild dilated RA    FAMILY HISTORY: Family History  Problem Relation Age of Onset  . Parkinsonism Brother   . COPD Mother   . Allergies Mother   . Heart failure Mother   . COPD Father   . Stroke Father   . Other Sister   . Suicidality Maternal Aunt   . Cancer Maternal Grandfather     SOCIAL HISTORY:  Social History   Social History  . Marital status: Married    Spouse name: Gavin Pound  . Number of children: 1  . Years of education: BA, [place], JD   Occupational History  . teaches constitutional law   . PROFESSOR [employer]   Social History Main Topics  . Smoking status: Never Smoker  . Smokeless tobacco: Never Used  . Alcohol use Yes     Comment: social  . Drug use: No  . Sexual activity: Not on file   Other Topics Concern  . Not on file   Social History Narrative   Patient lives at home wife.   Daily caffeine use     PHYSICAL EXAM  Vitals:   12/30/16 0816  Weight: 125 lb (56.7 kg)  Height:  (1.753 m)    Not recorded     Orthostatic VS for the past 24 hrs (Last 3 readings):  BP- Lying Pulse- Lying BP- Sitting Pulse- Sitting BP- Standing at 0 minutes Pulse- Standing at 0 minutes  12/30/16 0825 110/60 60 107/62 63 103/61 67   Body mass index is 18.46 kg/m.  No flowsheet data found.   GENERAL EXAM: Patient is in no distress; well developed, nourished and groomed; neck is supple  CARDIOVASCULAR: Regular rate and rhythm, no murmurs, no carotid bruits  NEUROLOGIC: MENTAL STATUS: awake, alert, oriented to person, place and time, recent and remote memory intact, normal attention and concentration, language fluent, comprehension intact, naming intact, fund of knowledge appropriate CRANIAL NERVE: no papilledema on fundoscopic exam, pupils equal and reactive to light, visual fields full to confrontation, extraocular muscles intact, no nystagmus, facial sensation and strength symmetric, hearing intact, palate elevates symmetrically,  uvula midline, shoulder shrug symmetric, tongue midline. MOTOR: normal bulk and tone, POSTURAL TREMOR IN BUE; full strength in the BUE, BLE SENSORY: normal and symmetric to light touch, temperature, vibration  COORDINATION: MILD ACTION TREMOR WITH FINGER NOSE FINGER REFLEXES: deep tendon reflexes BRISK and symmetric GAIT/STATION: narrow based gait; SHORT STEPS; SLIGHTLY SPASTIC GAIT; DECR  ARM SWING    DIAGNOSTIC DATA (LABS, IMAGING, TESTING) - I reviewed patient records, labs, notes, testing and imaging myself where available.  Lab Results  Component Value Date   WBC 6.8 08/12/2016   HGB 14.0 08/12/2016   HCT 40.9 08/12/2016   MCV 94.2 08/12/2016   PLT 117 (L) 08/12/2016      Component Value Date/Time   NA 137 08/12/2016 1559   K 4.2 08/12/2016 1559   CL 104 08/12/2016 1559   CO2 26 08/12/2016 1559   GLUCOSE 95 08/12/2016 1559   BUN 18 08/12/2016 1559   CREATININE 0.68 08/12/2016 1559   CALCIUM 9.2 08/12/2016 1559   PROT 6.3 (L) 11/20/2014 2140   ALBUMIN 3.9 11/20/2014 2140   AST 23 11/20/2014 2140   ALT 16 (L) 11/20/2014 2140   ALKPHOS 82 11/20/2014 2140   BILITOT 0.4 11/20/2014 2140   GFRNONAA >60 08/12/2016 1559   GFRAA >60 08/12/2016 1559   No results found for: CHOL No results found for: HGBA1C No results found for: VITAMINB12 No results found for: TSH  07/20/10 MRI CERVICAL 1. Left C6-C7 paracentral disc protrusion abutting the exiting left C7 nerve roots appears new. Preexisting mild left C7 foraminal stenosis at this level due to facet hypertrophy.  2. Mild combined congenital and acquired spinal stenosis at the C3-C4 and C4-C5.  11/20/10 MRI brain (without contrast) demonstrating: 1. Moderate ventriculomegaly in the temporal and occipital horns.  Likely due to subcortical atrophy. 2. No acute findings are seen.  11/20/10 MRA head (without contrast) demonstrating: - mild atheromatous irregularities in the bilateral carotid siphon regions.  11/19/10 EMG/NCS -  normal  11/19/10 carotid u/s - normal  11/18/10 TTE - EF 55-60%; normal wall motion  09/19/13 MRI brain (with and without) demonstrating: 1. Moderate mesial temporal atrophy. 2. Moderate ventriculomegaly on ex vacuo basis. 3. No acute findings.  11/11/16 MRI brain [I reviewed images myself and agree with interpretation. -VRP]  1. No acute intracranial process. 2. Progressed nonspecific moderate to severe parenchymal brain volume loss for age. No hydrocephalus.    ASSESSMENT AND PLAN  76 y.o. year old male here with chronic neck pain x 10 years, int HA x 1-2 years, with a 2 minute episode of vertical double vision in June 2015, with no recurrence.   Dx:  Migraine with aura and without status migrainosus, not intractable  Neck pain  Double vision     PLAN:  MIGRAINE WITH AURA - start low dose amitriptyline for migraine / insomnia  WORD FINDING DIFFICULTY / MCI - consider braincheck or other cognitive testing  GAIT DIFFICULTY / HYPERREFLEXIA / SPINAL STENOSIS  - check MRI cervical spine   Meds ordered this encounter  Medications  . amitriptyline (ELAVIL) 10 MG tablet    Sig: Take 1 tablet (10 mg total) by mouth at bedtime.    Dispense:  30 tablet    Refill:  6   Orders Placed This Encounter  Procedures  . MR CERVICAL SPINE WO CONTRAST   Return in about 6 months (around 06/30/2017).    Suanne Marker, MD 12/30/2016, 9:08 AM Certified in Neurology, Neurophysiology and Neuroimaging  [facility] [address] [place], [place] [address] [phone]

## 2017-01-01 DIAGNOSIS — R3915 Urgency of urination: Secondary | ICD-10-CM | POA: Diagnosis not present

## 2017-01-01 DIAGNOSIS — M6281 Muscle weakness (generalized): Secondary | ICD-10-CM | POA: Diagnosis not present

## 2017-01-01 DIAGNOSIS — R3912 Poor urinary stream: Secondary | ICD-10-CM | POA: Diagnosis not present

## 2017-01-01 DIAGNOSIS — M62838 Other muscle spasm: Secondary | ICD-10-CM | POA: Diagnosis not present

## 2017-01-13 DIAGNOSIS — M62838 Other muscle spasm: Secondary | ICD-10-CM | POA: Diagnosis not present

## 2017-01-13 DIAGNOSIS — M6281 Muscle weakness (generalized): Secondary | ICD-10-CM | POA: Diagnosis not present

## 2017-01-13 DIAGNOSIS — R3912 Poor urinary stream: Secondary | ICD-10-CM | POA: Diagnosis not present

## 2017-01-13 DIAGNOSIS — R338 Other retention of urine: Secondary | ICD-10-CM | POA: Diagnosis not present

## 2017-01-16 DIAGNOSIS — R3915 Urgency of urination: Secondary | ICD-10-CM | POA: Diagnosis not present

## 2017-01-16 DIAGNOSIS — R300 Dysuria: Secondary | ICD-10-CM | POA: Diagnosis not present

## 2017-01-16 DIAGNOSIS — R350 Frequency of micturition: Secondary | ICD-10-CM | POA: Diagnosis not present

## 2017-01-16 DIAGNOSIS — R102 Pelvic and perineal pain: Secondary | ICD-10-CM | POA: Diagnosis not present

## 2017-01-20 DIAGNOSIS — M62838 Other muscle spasm: Secondary | ICD-10-CM | POA: Diagnosis not present

## 2017-01-20 DIAGNOSIS — M6281 Muscle weakness (generalized): Secondary | ICD-10-CM | POA: Diagnosis not present

## 2017-01-20 DIAGNOSIS — R3912 Poor urinary stream: Secondary | ICD-10-CM | POA: Diagnosis not present

## 2017-01-20 DIAGNOSIS — R102 Pelvic and perineal pain: Secondary | ICD-10-CM | POA: Diagnosis not present

## 2017-02-02 ENCOUNTER — Ambulatory Visit
Admission: RE | Admit: 2017-02-02 | Discharge: 2017-02-02 | Disposition: A | Discharge status: Home / Self Care | Payer: BLUE CROSS/BLUE SHIELD | Source: Ambulatory Visit | Attending: Diagnostic Neuroimaging | Admitting: Diagnostic Neuroimaging

## 2017-02-02 DIAGNOSIS — G959 Disease of spinal cord, unspecified: Secondary | ICD-10-CM | POA: Diagnosis not present

## 2017-02-03 ENCOUNTER — Ambulatory Visit (INDEPENDENT_AMBULATORY_CARE_PROVIDER_SITE_OTHER): Payer: Medicare Other | Admitting: Orthopedic Surgery

## 2017-02-03 DIAGNOSIS — R3912 Poor urinary stream: Secondary | ICD-10-CM | POA: Diagnosis not present

## 2017-02-03 DIAGNOSIS — R102 Pelvic and perineal pain: Secondary | ICD-10-CM | POA: Diagnosis not present

## 2017-02-03 DIAGNOSIS — M6281 Muscle weakness (generalized): Secondary | ICD-10-CM | POA: Diagnosis not present

## 2017-02-03 DIAGNOSIS — M62838 Other muscle spasm: Secondary | ICD-10-CM | POA: Diagnosis not present

## 2017-02-06 ENCOUNTER — Ambulatory Visit (INDEPENDENT_AMBULATORY_CARE_PROVIDER_SITE_OTHER): Payer: Self-pay

## 2017-02-06 ENCOUNTER — Encounter (INDEPENDENT_AMBULATORY_CARE_PROVIDER_SITE_OTHER): Payer: Self-pay | Admitting: Orthopedic Surgery

## 2017-02-06 ENCOUNTER — Ambulatory Visit (INDEPENDENT_AMBULATORY_CARE_PROVIDER_SITE_OTHER): Payer: BLUE CROSS/BLUE SHIELD | Admitting: Orthopedic Surgery

## 2017-02-06 DIAGNOSIS — M25511 Pain in right shoulder: Secondary | ICD-10-CM | POA: Diagnosis not present

## 2017-02-06 DIAGNOSIS — M79642 Pain in left hand: Secondary | ICD-10-CM

## 2017-02-06 MED ORDER — HYDROCODONE-ACETAMINOPHEN 5-325 MG PO TABS: ORAL_TABLET | ORAL | 0 refills | Status: DC

## 2017-02-06 NOTE — Progress Notes (Signed)
Office Visit Note   Patient: Lee Nelson           Date of Birth: 1941/02/13           MRN: [id number] Visit Date: 02/06/2017 Requested by: Chilton GreathouseAvva, Ravisankar, MD [address] [place], [place] [address] PCP: Chilton GreathouseAvva, Ravisankar, MD  Subjective: Chief Complaint  Patient presents with  . Left Hand - Pain  . Right Shoulder - Pain    HPI: Lee Nelson is a 76 year old patient with left hand pain and right shoulder pain and neck pain.  He fell on 01/24/2017.  Does not report much in the way of loss of consciousness.  Neurologist is seeing him regarding his neck pain.  [facility] and that is reviewed today with him and it does show some mild to moderate spinal stenosis at one level.  States that the numbness and tingling in his hands is better.  He has been taking about half of the hydrocodone per day to help him sleep.  I did caution him against the continued and prepectoral use of narcotics for this problem.  He voiced understanding of that.              ROS: All systems reviewed are negative as they relate to the chief complaint within the history of present illness.  Patient denies  fevers or chills.   Assessment & Plan: Visit Diagnoses:  1. Pain in left hand   2. Acute pain of right shoulder     Plan: Impression is left hand pain with no evidence of fracture or ligament or tendon disruption on exam.  Right shoulder pain which also shows good rotator cuff strength and motion which is symmetric to the left-hand side.  The neck has some spinal stenosis and a small disc bulge.  We may try some cervical traction for that to see if it can help.  One time prescription for hydrocodone provided which he should only take about half a tablet before he goes to bed.  He is pain is worse at night.  He should follow up with the neurologist for follow-up on the neck and the head.  Follow-Up Instructions: Return if symptoms worsen or fail to improve.   Orders:    No orders of the defined types were placed in this encounter.  Meds ordered this encounter  Medications  . HYDROcodone-acetaminophen (NORCO/VICODIN) 5-325 MG tablet    Sig: 1 po q d prn pain    Dispense:  30 tablet    Refill:  0      Procedures: No procedures performed   Clinical Data: No additional findings.  Objective: Vital Signs: There were no vitals taken for this visit.  Physical Exam:   Constitutional: Patient appears well-developed HEENT:  Head: Normocephalic Eyes:EOM are normal Neck: Normal range of motion Cardiovascular: Normal rate Pulmonary/chest: Effort normal Neurologic: Patient is alert Skin: Skin is warm Psychiatric: Patient has normal mood and affect    Ortho Exam: Orthopedic examination of the left hand demonstrates full composite flexion extension but with some swelling in digits 2 and 3 around the PIP joint.  There is no evidence of central slip disruption in either of these fingers.  Radial pulses intact.  No snuffbox tenderness.  No tenderness over the radial styloid.  Right shoulder is examined and it shows full active and passive range of motion symmetric to the left-hand side.  No course grinding or crepitus noted with intact rotator cuff  strength isolated and split in the supraspinatus and subscap muscle testing.  He has pre-symmetric grip strength and no definite paresthesias C5 T1.  Specialty Comments:  No specialty comments available.  Imaging: No results found.   PMFS History: Patient Active Problem List   Diagnosis Date Noted  . PSVT (paroxysmal supraventricular tachycardia) (HCC) 09/12/2015  . Chest pain 05/03/2015  . Dyspnea 05/03/2015  . ALLERGIC RHINITIS 09/21/2007  . G E R D 09/21/2007  . SMOKE INHALATION 09/21/2007  . OSTEOPOROSIS 01/28/2007  . PALPITATIONS 01/28/2007  . COUGH 01/28/2007  . ALLERGY 01/28/2007   Past Medical History:  Diagnosis Date  . Anxiety   . Arthritis   . Bladder calculus   . BPH (benign  prostatic hyperplasia)   . Chronic constipation   . Complication of anesthesia    " I had some coughing afterwards for a couple of days"--  per pt "perfers spinal anesthesia since general anesthesia congnitive issues when older"  . Diverticulosis of colon   . Dry eye syndrome of both eyes   . Environmental allergies   . GERD (gastroesophageal reflux disease)    occasional  . History of adenomatous polyp of colon    08/ 2004  . History of kidney stones   . History of squamous cell carcinoma in situ (SCCIS) of skin    s/p  excision 2013 facial areas and 06/ 2016 nose  . Migraine    eye migraine occasional  . Seasonal and perennial allergic rhinitis   . Thrombocytopenia (HCC)   . Tingling    hands and feet bilat , intermittantly-- per pt has lumbar bulging disk  . Urinary frequency   . Vocal fold atrophy    dysphonia-- per pt has to drink large amount of water to take even on pill  . Wears glasses     Family History  Problem Relation Age of Onset  . Parkinsonism Brother   . COPD Mother   . Allergies Mother   . Heart failure Mother   . COPD Father   . Stroke Father   . Other Sister   . Suicidality Maternal Aunt   . Cancer Maternal Grandfather     Past Surgical History:  Procedure Laterality Date  . APPENDECTOMY  child  . CARDIOVASCULAR STRESS TEST  05-17-2015  dr hilty   Low risk nuclear study w/ no ischemia/  normal LV function and wall motion , stress ef 54% (lvef 45-54%)  . COLONOSCOPY  last one 09-06-2010  . EXTRACORPOREAL SHOCK WAVE LITHOTRIPSY  yrs ago  . INGUINAL HERNIA REPAIR Left child  . NISSEN FUNDOPLICATION  1980's   open  . TONSILLECTOMY  child  . TRANSTHORACIC ECHOCARDIOGRAM  11/18/2010   grade 1 diastolic dysfunction, ef 55-60%/  trivial MR and TR/ mild dilated RA   Social History   Occupational History  . Occupation: teaches constitutional Social workerlaw  . Occupation: PhotographerOR    Employer: [employer]  Tobacco Use  . Smoking status: Never Smoker   . Smokeless tobacco: Never Used  Substance and Sexual Activity  . Alcohol use: Yes    Comment: social  . Drug use: No  . Sexual activity: Not on file

## 2017-02-10 DIAGNOSIS — R351 Nocturia: Secondary | ICD-10-CM | POA: Diagnosis not present

## 2017-02-10 DIAGNOSIS — R350 Frequency of micturition: Secondary | ICD-10-CM | POA: Diagnosis not present

## 2017-02-10 DIAGNOSIS — N401 Enlarged prostate with lower urinary tract symptoms: Secondary | ICD-10-CM | POA: Diagnosis not present

## 2017-02-16 ENCOUNTER — Telehealth: Payer: Self-pay | Admitting: *Deleted

## 2017-02-16 NOTE — Telephone Encounter (Signed)
LMVM for pt to return call for MRI cervical spine results.

## 2017-02-16 NOTE — Telephone Encounter (Signed)
-----   Message from Suanne MarkerVikram R Penumalli, MD sent at 02/15/2017 10:07 PM EST ----- Multi-level degenerative changes in cervical spine. Recommend conservative mgmt. Please call patient. Continue current plan. -VRP

## 2017-02-17 NOTE — Telephone Encounter (Signed)
Pt returned call for MRI cervical results.  I relayed per Dr. Marjory LiesPenumalli that showed multi-level degenerative changes in cervical spine.  Dr. Marjory Liespenumalli recommended conservative management.  Continue his plan of care.  Pt verbalized understanding.  He also stated that he had fallen 2-3 wks ago, this was prior to MRI.  Has some dizziness, lightheadedness which had worsened (he had previously).  Neck pain is better.  Was keeping us updated.  He was going down front steps and stumbles, landed on knees and hands.   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

## 2017-02-26 DIAGNOSIS — R350 Frequency of micturition: Secondary | ICD-10-CM | POA: Diagnosis not present

## 2017-02-26 DIAGNOSIS — R102 Pelvic and perineal pain: Secondary | ICD-10-CM | POA: Diagnosis not present

## 2017-02-26 DIAGNOSIS — M6281 Muscle weakness (generalized): Secondary | ICD-10-CM | POA: Diagnosis not present

## 2017-02-26 DIAGNOSIS — M62838 Other muscle spasm: Secondary | ICD-10-CM | POA: Diagnosis not present

## 2017-03-02 DIAGNOSIS — Z86010 Personal history of colonic polyps: Secondary | ICD-10-CM | POA: Diagnosis not present

## 2017-03-02 DIAGNOSIS — R1010 Upper abdominal pain, unspecified: Secondary | ICD-10-CM | POA: Diagnosis not present

## 2017-03-03 ENCOUNTER — Other Ambulatory Visit: Payer: Self-pay | Admitting: Gastroenterology

## 2017-03-03 DIAGNOSIS — Z86010 Personal history of colonic polyps: Secondary | ICD-10-CM

## 2017-03-13 DIAGNOSIS — N401 Enlarged prostate with lower urinary tract symptoms: Secondary | ICD-10-CM | POA: Diagnosis not present

## 2017-03-13 DIAGNOSIS — N200 Calculus of kidney: Secondary | ICD-10-CM | POA: Diagnosis not present

## 2017-03-13 DIAGNOSIS — R350 Frequency of micturition: Secondary | ICD-10-CM | POA: Diagnosis not present

## 2017-03-13 DIAGNOSIS — R102 Pelvic and perineal pain: Secondary | ICD-10-CM | POA: Diagnosis not present

## 2017-03-17 DIAGNOSIS — R3912 Poor urinary stream: Secondary | ICD-10-CM | POA: Diagnosis not present

## 2017-03-17 DIAGNOSIS — M6281 Muscle weakness (generalized): Secondary | ICD-10-CM | POA: Diagnosis not present

## 2017-03-17 DIAGNOSIS — R350 Frequency of micturition: Secondary | ICD-10-CM | POA: Diagnosis not present

## 2017-03-17 DIAGNOSIS — M62838 Other muscle spasm: Secondary | ICD-10-CM | POA: Diagnosis not present

## 2017-03-26 DIAGNOSIS — L821 Other seborrheic keratosis: Secondary | ICD-10-CM | POA: Diagnosis not present

## 2017-03-26 DIAGNOSIS — Z85828 Personal history of other malignant neoplasm of skin: Secondary | ICD-10-CM | POA: Diagnosis not present

## 2017-03-30 DIAGNOSIS — S81802A Unspecified open wound, left lower leg, initial encounter: Secondary | ICD-10-CM | POA: Diagnosis not present

## 2017-04-02 DIAGNOSIS — M62838 Other muscle spasm: Secondary | ICD-10-CM | POA: Diagnosis not present

## 2017-04-02 DIAGNOSIS — R3915 Urgency of urination: Secondary | ICD-10-CM | POA: Diagnosis not present

## 2017-04-02 DIAGNOSIS — M6281 Muscle weakness (generalized): Secondary | ICD-10-CM | POA: Diagnosis not present

## 2017-04-02 DIAGNOSIS — R338 Other retention of urine: Secondary | ICD-10-CM | POA: Diagnosis not present

## 2017-04-06 ENCOUNTER — Encounter (INDEPENDENT_AMBULATORY_CARE_PROVIDER_SITE_OTHER): Payer: Self-pay | Admitting: Orthopedic Surgery

## 2017-04-06 ENCOUNTER — Ambulatory Visit (INDEPENDENT_AMBULATORY_CARE_PROVIDER_SITE_OTHER): Payer: BLUE CROSS/BLUE SHIELD | Admitting: Orthopedic Surgery

## 2017-04-06 ENCOUNTER — Ambulatory Visit (INDEPENDENT_AMBULATORY_CARE_PROVIDER_SITE_OTHER): Payer: Self-pay

## 2017-04-06 DIAGNOSIS — M79642 Pain in left hand: Secondary | ICD-10-CM

## 2017-04-10 NOTE — Progress Notes (Signed)
Office Visit Note   Patient: Lee Nelson           Date of Birth: September 08, 1940           MRN: [id number] Visit Date: 04/06/2017 Requested by: Chilton GreathouseAvva, Ravisankar, MD [address] [place], [place] [address] PCP: Chilton GreathouseAvva, Ravisankar, MD  Subjective: Chief Complaint  Patient presents with  . Left Hand - Follow-up    HPI: Lee NeedleMichael is a patient with left hand pain.  He was initially seen for this in November.  He took a fall in his hand still hurts.  Reports persistent soreness particularly in digits 3 and 4.  Also describes decreased grip strength and inability to make a fist.  He is right-hand dominant.              ROS: All systems reviewed are negative as they relate to the chief complaint within the history of present illness.  Patient denies  fevers or chills.   Assessment & Plan: Visit Diagnoses:  1. Pain in left hand     Plan: Impression is left hand pain with some small avulsion fractures off of the middle phalanx around the DIP joints of digits 3 and 4.  He does have soft tissue swelling in these areas.  I think this is something that he is just going to have to work out.  That joint is very difficult to mobilize after any type of injury.  No further treatment really recommended for this other than progressive mobilization.  Follow-up as needed  Follow-Up Instructions: Return if symptoms worsen or fail to improve.   Orders:  Orders Placed This Encounter  Procedures  . XR Hand Complete Left   No orders of the defined types were placed in this encounter.     Procedures: No procedures performed   Clinical Data: No additional findings.  Objective: Vital Signs: There were no vitals taken for this visit.  Physical Exam:   Constitutional: Patient appears well-developed HEENT:  Head: Normocephalic Eyes:EOM are normal Neck: Normal range of motion Cardiovascular: Normal rate Pulmonary/chest: Effort normal Neurologic: Patient is alert Skin: Skin is  warm Psychiatric: Patient has normal mood and affect    Ortho Exam: Orthopedic exam demonstrates a focal loss of motion of about 10-15 degrees at the PIP joints of digits 3 and 4 the remainder of the digits are intact.  Flexor and extensor tendon function is intact in the thumb and 4 digits.  Patient has full extension.  Soft tissue swelling is present at the PIP joints 3 and 4  Specialty Comments:  No specialty comments available.  Imaging: No results found.   PMFS History: Patient Active Problem List   Diagnosis Date Noted  . PSVT (paroxysmal supraventricular tachycardia) (HCC) 09/12/2015  . Chest pain 05/03/2015  . Dyspnea 05/03/2015  . ALLERGIC RHINITIS 09/21/2007  . G E R D 09/21/2007  . SMOKE INHALATION 09/21/2007  . OSTEOPOROSIS 01/28/2007  . PALPITATIONS 01/28/2007  . COUGH 01/28/2007  . ALLERGY 01/28/2007   Past Medical History:  Diagnosis Date  . Anxiety   . Arthritis   . Bladder calculus   . BPH (benign prostatic hyperplasia)   . Chronic constipation   . Complication of anesthesia    " I had some coughing afterwards for a couple of days"--  per pt "perfers spinal anesthesia since general anesthesia congnitive issues when older"  . Diverticulosis of colon   . Dry eye syndrome of both eyes   . Environmental allergies   .  GERD (gastroesophageal reflux disease)    occasional  . History of adenomatous polyp of colon    08/ 2004  . History of kidney stones   . History of squamous cell carcinoma in situ (SCCIS) of skin    s/p  excision 2013 facial areas and 06/ 2016 nose  . Migraine    eye migraine occasional  . Seasonal and perennial allergic rhinitis   . Thrombocytopenia (HCC)   . Tingling    hands and feet bilat , intermittantly-- per pt has lumbar bulging disk  . Urinary frequency   . Vocal fold atrophy    dysphonia-- per pt has to drink large amount of water to take even on pill  . Wears glasses     Family History  Problem Relation Age of Onset  .  Parkinsonism Brother   . COPD Mother   . Allergies Mother   . Heart failure Mother   . COPD Father   . Stroke Father   . Other Sister   . Suicidality Maternal Aunt   . Cancer Maternal Grandfather     Past Surgical History:  Procedure Laterality Date  . APPENDECTOMY  child  . CARDIOVASCULAR STRESS TEST  05-17-2015  dr hilty   Low risk nuclear study w/ no ischemia/  normal LV function and wall motion , stress ef 54% (lvef 45-54%)  . CHOLECYSTECTOMY N/A 09/29/2014   Procedure: LAPAROSCOPIC CHOLECYSTECTOMY WITH INTRAOPERATIVE CHOLANGIOGRAM;  Surgeon: Ovidio Kin, MD;  Location: [facility];  Service: General;  Laterality: N/A;  . COLONOSCOPY  last one 09-06-2010  . ESOPHAGOGASTRODUODENOSCOPY N/A 12/09/2013   Procedure: ESOPHAGOGASTRODUODENOSCOPY (EGD);  Surgeon: Willis Modena, MD;  Location: [facility];  Service: Endoscopy;  Laterality: N/A;  . EXTRACORPOREAL SHOCK WAVE LITHOTRIPSY  yrs ago  . INGUINAL HERNIA REPAIR Left child  . NISSEN FUNDOPLICATION  1980's   open  . STONE EXTRACTION WITH BASKET N/A 08/18/2016   Procedure: STONE EXTRACTION WITH BASKET;  Surgeon: Jethro Bolus, MD;  Location: [facility];  Service: Urology;  Laterality: N/A;  . THULIUM LASER TURP (TRANSURETHRAL RESECTION OF PROSTATE) N/A 08/18/2016   Procedure: THULIUM LASER BLADDER NECK INCISION AND BLADDER STONE REMOVAL;  Surgeon: Jethro Bolus, MD;  Location: [facility];  Service: Urology;  Laterality: N/A;  . TONSILLECTOMY  child  . TRANSTHORACIC ECHOCARDIOGRAM  11/18/2010   grade 1 diastolic dysfunction, ef 55-60%/  trivial MR and TR/ mild dilated RA   Social History   Occupational History  . Occupation: teaches constitutional Social worker  . 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 Smoking status: Never Smoker  . Smokeless tobacco: Never Used  Substance and Sexual Activity  . Alcohol use: Yes    Comment: social  . Drug use: No  . Sexual  activity: Not on file

## 2017-04-19 ENCOUNTER — Emergency Department (HOSPITAL_COMMUNITY)
Admission: EM | Admit: 2017-04-19 | Discharge: 2017-04-19 | Disposition: A | Discharge status: Home / Self Care | Payer: BLUE CROSS/BLUE SHIELD | Attending: Emergency Medicine | Admitting: Emergency Medicine

## 2017-04-19 ENCOUNTER — Encounter (HOSPITAL_COMMUNITY): Payer: Self-pay | Admitting: Emergency Medicine

## 2017-04-19 ENCOUNTER — Emergency Department (HOSPITAL_COMMUNITY): Payer: BLUE CROSS/BLUE SHIELD

## 2017-04-19 DIAGNOSIS — R531 Weakness: Secondary | ICD-10-CM | POA: Diagnosis not present

## 2017-04-19 DIAGNOSIS — Z79899 Other long term (current) drug therapy: Secondary | ICD-10-CM | POA: Insufficient documentation

## 2017-04-19 DIAGNOSIS — B349 Viral infection, unspecified: Secondary | ICD-10-CM | POA: Diagnosis not present

## 2017-04-19 DIAGNOSIS — W1830XA Fall on same level, unspecified, initial encounter: Secondary | ICD-10-CM | POA: Diagnosis not present

## 2017-04-19 DIAGNOSIS — R42 Dizziness and giddiness: Secondary | ICD-10-CM | POA: Insufficient documentation

## 2017-04-19 DIAGNOSIS — R05 Cough: Secondary | ICD-10-CM | POA: Diagnosis not present

## 2017-04-19 DIAGNOSIS — E860 Dehydration: Secondary | ICD-10-CM | POA: Diagnosis not present

## 2017-04-19 LAB — URINALYSIS, ROUTINE W REFLEX MICROSCOPIC
Bilirubin Urine: NEGATIVE
Glucose, UA: NEGATIVE mg/dL
Hgb urine dipstick: NEGATIVE
Ketones, ur: 20 mg/dL — AB
Leukocytes, UA: NEGATIVE
Nitrite: NEGATIVE
Protein, ur: NEGATIVE mg/dL
Specific Gravity, Urine: 1.027 (ref 1.005–1.030)
pH: 5 (ref 5.0–8.0)

## 2017-04-19 LAB — CBC
HCT: 39.7 % (ref 39.0–52.0)
Hemoglobin: 14 g/dL (ref 13.0–17.0)
MCH: 33.5 pg (ref 26.0–34.0)
MCHC: 35.3 g/dL (ref 30.0–36.0)
MCV: 95 fL (ref 78.0–100.0)
Platelets: 100 10*3/uL — ABNORMAL LOW (ref 150–400)
RBC: 4.18 MIL/uL — ABNORMAL LOW (ref 4.22–5.81)
RDW: 13.6 % (ref 11.5–15.5)
WBC: 5.4 10*3/uL (ref 4.0–10.5)

## 2017-04-19 LAB — BASIC METABOLIC PANEL
Anion gap: 5 (ref 5–15)
BUN: 20 mg/dL (ref 6–20)
CO2: 27 mmol/L (ref 22–32)
Calcium: 8.9 mg/dL (ref 8.9–10.3)
Chloride: 104 mmol/L (ref 101–111)
Creatinine, Ser: 0.87 mg/dL (ref 0.61–1.24)
GFR calc Af Amer: >60 mL/min (ref >60)
GFR calc non Af Amer: >60 mL/min (ref >60)
Glucose, Bld: 119 mg/dL — ABNORMAL HIGH (ref 65–99)
Potassium: 3.9 mmol/L (ref 3.5–5.1)
Sodium: 136 mmol/L (ref 135–145)

## 2017-04-19 LAB — CBG MONITORING, ED: Glucose-Capillary: 99 mg/dL (ref 65–99)

## 2017-04-19 MED ORDER — METHYLPREDNISOLONE SODIUM SUCC 125 MG IJ SOLR
60.0000 mg | Freq: Once | INTRAMUSCULAR | Status: AC
  Administered 2017-04-19: 60 mg via INTRAVENOUS
  Filled 2017-04-19: qty 2

## 2017-04-19 MED ORDER — IPRATROPIUM-ALBUTEROL 0.5-2.5 (3) MG/3ML IN SOLN
3.0000 mL | Freq: Once | RESPIRATORY_TRACT | Status: AC
  Administered 2017-04-19: 3 mL via RESPIRATORY_TRACT
  Filled 2017-04-19: qty 3

## 2017-04-19 MED ORDER — SODIUM CHLORIDE 0.9 % IV BOLUS (SEPSIS)
1000.0000 mL | Freq: Once | INTRAVENOUS | Status: AC
  Administered 2017-04-19: 1000 mL via INTRAVENOUS

## 2017-04-19 NOTE — ED Triage Notes (Addendum)
Patient reports fall this morning r/t weakness and dizziness. Also reports cough x3 days. Patient does reports hitting his head but denies taking blood thinners. Denies N/V/D.

## 2017-04-19 NOTE — ED Provider Notes (Signed)
[facility]-EMERGENCY DEPT Provider Note   CSN: [id number] Arrival date & time: 04/19/17  1456     History   Chief Complaint Chief Complaint  Patient presents with  . Weakness  . Cough    HPI Lee Nelson is a 77 y.o. male.  Cough for 3 days with associated weakness, dizziness, and fall this morning.  He feels dehydrated.  No fever, sweats, chills, neuro deficits, stiff neck, chest pain, dyspnea, dysuria.  He has been around several family members with a viral illness.      Past Medical History:  Diagnosis Date  . Anxiety   . Arthritis   . Bladder calculus   . BPH (benign prostatic hyperplasia)   . Chronic constipation   . Complication of anesthesia    " I had some coughing afterwards for a couple of days"--  per pt "perfers spinal anesthesia since general anesthesia congnitive issues when older"  . Diverticulosis of colon   . Dry eye syndrome of both eyes   . Environmental allergies   . GERD (gastroesophageal reflux disease)    occasional  . History of adenomatous polyp of colon    08/ 2004  . History of kidney stones   . History of squamous cell carcinoma in situ (SCCIS) of skin    s/p  excision 2013 facial areas and 06/ 2016 nose  . Migraine    eye migraine occasional  . Seasonal and perennial allergic rhinitis   . Thrombocytopenia (HCC)   . Tingling    hands and feet bilat , intermittantly-- per pt has lumbar bulging disk  . Urinary frequency   . Vocal fold atrophy    dysphonia-- per pt has to drink large amount of water to take even on pill  . Wears glasses     Patient Active Problem List   Diagnosis Date Noted  . PSVT (paroxysmal supraventricular tachycardia) (HCC) 09/12/2015  . Chest pain 05/03/2015  . Dyspnea 05/03/2015  . ALLERGIC RHINITIS 09/21/2007  . G E R D 09/21/2007  . SMOKE INHALATION 09/21/2007  . OSTEOPOROSIS 01/28/2007  . PALPITATIONS 01/28/2007  . COUGH 01/28/2007  . ALLERGY 01/28/2007    Past Surgical  History:  Procedure Laterality Date  . APPENDECTOMY  child  . CARDIOVASCULAR STRESS TEST  05-17-2015  dr hilty   Low risk nuclear study w/ no ischemia/  normal LV function and wall motion , stress ef 54% (lvef 45-54%)  . CHOLECYSTECTOMY N/A 09/29/2014   Procedure: LAPAROSCOPIC CHOLECYSTECTOMY WITH INTRAOPERATIVE CHOLANGIOGRAM;  Surgeon: Ovidio Kin, MD;  Location: [facility];  Service: General;  Laterality: N/A;  . COLONOSCOPY  last one 09-06-2010  . ESOPHAGOGASTRODUODENOSCOPY N/A 12/09/2013   Procedure: ESOPHAGOGASTRODUODENOSCOPY (EGD);  Surgeon: Willis Modena, MD;  Location: [facility] ENDOSCOPY;  Service: Endoscopy;  Laterality: N/A;  . EXTRACORPOREAL SHOCK WAVE LITHOTRIPSY  yrs ago  . INGUINAL HERNIA REPAIR Left child  . NISSEN FUNDOPLICATION  1980's   open  . STONE EXTRACTION WITH BASKET N/A 08/18/2016   Procedure: STONE EXTRACTION WITH BASKET;  Surgeon: Jethro Bolus, MD;  Location: [facility];  Service: Urology;  Laterality: N/A;  . THULIUM LASER TURP (TRANSURETHRAL RESECTION OF PROSTATE) N/A 08/18/2016   Procedure: THULIUM LASER BLADDER NECK INCISION AND BLADDER STONE REMOVAL;  Surgeon: Jethro Bolus, MD;  [facility];  Service: Urology;  Laterality: N/A;  . TONSILLECTOMY  child  . TRANSTHORACIC ECHOCARDIOGRAM  11/18/2010   grade 1 diastolic dysfunction, ef 55-60%/  trivial MR and TR/  mild dilated RA       Home Medications    Prior to Admission medications   Medication Sig Start Date End Date Taking? Authorizing Provider  ALPRAZolam Prudy Feeler) 0.25 MG tablet Take 0.175-0.25 mg by mouth 2 (two) times daily as needed for sleep.    Yes [provider]  Cholecalciferol (VITAMIN D-3) 1000 units CAPS Take 2,000 Units by mouth daily.    Yes [provider]  Cranberry 400 MG CAPS Take 400-1,200 mg by mouth every morning.    Yes [provider]  loratadine (CLARITIN) 10 MG tablet Take 10 mg by mouth every morning.   Yes  [provider]  Menaquinone-7 (VITAMIN K2 PO) Take 120-240 mg by mouth daily. Combo w/ Vit. D   Yes [provider]  Milk Thistle 175 MG CAPS Take 1 capsule by mouth daily.    Yes [provider]  montelukast (SINGULAIR) 10 MG tablet Take 1 tablet (10 mg total) by mouth at bedtime. 04/24/15  Yes Bardelas, Bonnita Hollow, MD  Multiple Vitamin (MULTIVITAMIN) capsule Take 1 capsule by mouth every morning.    Yes [provider]  OVER THE COUNTER MEDICATION Take 3 capsules by mouth 2 (two) times daily. Lion's mane 0.5 gram/capsule   Yes [provider]  OVER THE COUNTER MEDICATION Take 2 capsules by mouth every morning. Reparagen supplement   Yes [provider]  OVER THE COUNTER MEDICATION Take 1 tablet by mouth daily as needed (for eye migraine). Mygra Few   Yes [provider]  OVER THE COUNTER MEDICATION CocoaVia flavonoids   Yes [provider]  OVER THE COUNTER MEDICATION Aloe, Mucilaginous, Polysaccheride, L-glutinine--- takes x2 50mg  each twice daily for constipation   Yes [provider]  OVER THE COUNTER MEDICATION Tranquil Sleep (5HDP, melatonin)  Takes bedtime as needed   Yes [provider]  OVER THE COUNTER MEDICATION Sinus Clear Herb PO-- takes as needed   Yes [provider]  Phosphatidylserine 100 MG CAPS Take 100 mg by mouth every morning.    Yes [provider]  Probiotic Product (PROBIOTIC DAILY PO) Take by mouth.   Yes [provider]  sodium chloride (MURO 128) 5 % ophthalmic ointment Place 1 application into both eyes 2 (two) times daily.    Yes [provider]  THEANINE PO Take 2 tablets by mouth 2 (two) times daily.    Yes [provider]  vitamin C (ASCORBIC ACID) 250 MG tablet Take 250 mg by mouth daily.   Yes [provider]  amitriptyline (ELAVIL) 10 MG tablet Take 1 tablet (10 mg total) by mouth at bedtime. Patient not taking: Reported on  04/19/2017 12/30/16   Suanne Marker, MD  HYDROcodone-acetaminophen (NORCO/VICODIN) 5-325 MG tablet 1 po q d prn pain Patient not taking: Reported on 04/19/2017 02/06/17   Cammy Copa, MD    Family History Family History  Problem Relation Age of Onset  . Parkinsonism Brother   . COPD Mother   . Allergies Mother   . Heart failure Mother   . COPD Father   . Stroke Father   . Other Sister   . Suicidality Maternal Aunt   . Cancer Maternal Grandfather     Social History Social History   Tobacco Use  . Smoking status: Never Smoker  . Smokeless tobacco: Never Used  Substance Use Topics  . Alcohol use: Yes    Comment: social  . Drug use: No     Allergies  Flagyl [metronidazole]; Metoclopramide hcl; and Septra [sulfamethoxazole-trimethoprim]   Review of Systems Review of Systems  All other systems reviewed and are negative.    Physical Exam Updated Vital Signs BP (!) 111/49   Pulse 89   Temp 99.9 F (37.7 C) (Oral)   Resp 18   SpO2 100%   Physical Exam  Constitutional: He is oriented to person, place, and time.  Feeble, nad, dehydrated  HENT:  Head: Normocephalic and atraumatic.  No evidence of head trauma.  Eyes: Conjunctivae are normal.  Neck: Neck supple.  Cardiovascular: Normal rate and regular rhythm.  Pulmonary/Chest: Effort normal and breath sounds normal.  Abdominal: Soft. Bowel sounds are normal.  Musculoskeletal: Normal range of motion.  Neurological: He is alert and oriented to person, place, and time.  Skin: Skin is warm and dry.  Psychiatric: He has a normal mood and affect. His behavior is normal.  Nursing note and vitals reviewed.    ED Treatments / Results  Labs (all labs ordered are listed, but only abnormal results are displayed) Labs Reviewed  BASIC METABOLIC PANEL - Abnormal; Notable for the following components:      Result Value   Glucose, Bld 119 (*)    All other components within normal limits  CBC - Abnormal;  Notable for the following components:   RBC 4.18 (*)    Platelets 100 (*)    All other components within normal limits  URINALYSIS, ROUTINE W REFLEX MICROSCOPIC - Abnormal; Notable for the following components:   Ketones, ur 20 (*)    All other components within normal limits  CBG MONITORING, ED    EKG  EKG Interpretation None       Radiology Dg Chest 2 View  Result Date: 04/19/2017 CLINICAL DATA:  Cough for 3 days. EXAM: CHEST  2 VIEW COMPARISON:  May 07, 2014 FINDINGS: Study is limited due to patient positioning including significant rotation. The heart, hila, and mediastinum are grossly unremarkable. No focal infiltrate, nodule, or mass. No evidence of pneumothorax. No other acute abnormalities. IMPRESSION: The study is limited by positioning but no acute abnormality is seen. A follow-up x-ray with better positioning could further evaluate if concern persists. Electronically Signed   By: Gerome Samavid  Williams III M.D   On: 04/19/2017 17:07    Procedures Procedures (including critical care time)  Medications Ordered in ED Medications  sodium chloride 0.9 % bolus 1,000 mL (0 mLs Intravenous Stopped 04/19/17 1824)  ipratropium-albuterol (DUONEB) 0.5-2.5 (3) MG/3ML nebulizer solution 3 mL (3 mLs Nebulization Given 04/19/17 1634)  methylPREDNISolone sodium succinate (SOLU-MEDROL) 125 mg/2 mL injection 60 mg (60 mg Intravenous Given 04/19/17 1635)     Initial Impression / Assessment and Plan / ED Course  I have reviewed the triage vital signs and the nursing notes.  Pertinent labs & imaging results that were available during my care of the patient were reviewed by me and considered in my medical decision making (see chart for details).     History and physical consistent with viral syndrome.  Labs, chest x-ray, urinalysis all reassuring.  He feels much better after 1 L of IV fluids, nebulizer treatment, IV steroids.  He was improved at discharge.  Discussed test results with the  patient and his sister.  Final Clinical Impressions(s) / ED Diagnoses   Final diagnoses:  Viral syndrome    ED Discharge Orders    None       Donnetta Hutchingook, Pheonix Wisby, MD 04/19/17 Serena [id number]

## 2017-04-19 NOTE — Discharge Instructions (Signed)
Tests showed no life-threatening condition.  Increase fluids.  Rest.  Tylenol for fever or achiness.

## 2017-04-21 DIAGNOSIS — G6289 Other specified polyneuropathies: Secondary | ICD-10-CM | POA: Diagnosis not present

## 2017-04-21 DIAGNOSIS — R509 Fever, unspecified: Secondary | ICD-10-CM | POA: Diagnosis not present

## 2017-04-21 DIAGNOSIS — J111 Influenza due to unidentified influenza virus with other respiratory manifestations: Secondary | ICD-10-CM | POA: Diagnosis not present

## 2017-04-21 DIAGNOSIS — R05 Cough: Secondary | ICD-10-CM | POA: Diagnosis not present

## 2017-04-29 DIAGNOSIS — R509 Fever, unspecified: Secondary | ICD-10-CM | POA: Diagnosis not present

## 2017-04-30 ENCOUNTER — Telehealth: Payer: Self-pay | Admitting: Diagnostic Neuroimaging

## 2017-04-30 NOTE — Telephone Encounter (Signed)
Ok to work in patient. -VRP

## 2017-04-30 NOTE — Telephone Encounter (Signed)
[facility] re: wanting pt to be seen before 04-02 since Dec 2018 he has had 3 falls and gate has worsen.  Lanice SchwabMisty is asking for a call back at [phone]

## 2017-04-30 NOTE — Telephone Encounter (Signed)
[facility]

## 2017-05-01 NOTE — Telephone Encounter (Signed)
I spoke to pt and made appt for him on 05-08-17 at 0930.  Emailed appt per his request as he was driving.

## 2017-05-04 DIAGNOSIS — Z125 Encounter for screening for malignant neoplasm of prostate: Secondary | ICD-10-CM | POA: Diagnosis not present

## 2017-05-04 DIAGNOSIS — Z0000 Encounter for general adult medical examination without abnormal findings: Secondary | ICD-10-CM | POA: Diagnosis not present

## 2017-05-04 DIAGNOSIS — M810 Age-related osteoporosis without current pathological fracture: Secondary | ICD-10-CM | POA: Diagnosis not present

## 2017-05-08 ENCOUNTER — Ambulatory Visit (INDEPENDENT_AMBULATORY_CARE_PROVIDER_SITE_OTHER): Payer: BLUE CROSS/BLUE SHIELD | Admitting: Diagnostic Neuroimaging

## 2017-05-08 ENCOUNTER — Encounter: Payer: Self-pay | Admitting: Diagnostic Neuroimaging

## 2017-05-08 VITALS — BP 88/56 | HR 63 | Wt 117.4 lb

## 2017-05-08 DIAGNOSIS — Z79899 Other long term (current) drug therapy: Secondary | ICD-10-CM

## 2017-05-08 DIAGNOSIS — R269 Unspecified abnormalities of gait and mobility: Secondary | ICD-10-CM | POA: Diagnosis not present

## 2017-05-08 DIAGNOSIS — R413 Other amnesia: Secondary | ICD-10-CM | POA: Diagnosis not present

## 2017-05-08 DIAGNOSIS — R6889 Other general symptoms and signs: Secondary | ICD-10-CM | POA: Diagnosis not present

## 2017-05-08 NOTE — Progress Notes (Signed)
[name]  PATIENT: Lee Nelson DOB: Jan 03, 1941  REFERRING CLINICIAN: Avva HISTORY FROM: patient  REASON FOR VISIT: new problem / existing patient   HISTORICAL  CHIEF COMPLAINT:  Chief Complaint  Patient presents with  . Gait Problem    rm 7, "2 falls, unsure of cause, sensation of falling forward"  . Falls    HISTORY OF PRESENT ILLNESS:   UPDATE (05/07/17, VRP): Since last visit, doing about the same. Tolerating meds. No alleviating or aggravating factors. Has had more falls and balance issues. More memory loss issues.   UPDATE 12/30/16: Since last visit, continues with word finding diff. Also with intermittent right sided headaches, intermittent double vision, and neck pain. Avg 1-2 days per week of headache.   UPDATE 03/10/14: Since last visit, no more double vision attacks. Notes some work finding diff. Still with stress from his wife's quadriplegia and 24 hour care at home.   PRIOR HPI (09/07/13): 77 year old right-handed male with history of neck pain, back pain, skin cancer, reflux disease, osteoporosis, anxiety, fibromyalgia, migraine, here for evaluation of headaches and transient double vision. Previous evaluated patient in 2012 for right arm numbness. Patient reports over ten-year history of intermittent neck pain. Patient has some degenerative cervical spine disease managed conservatively. Patient has one to 2 year history of intermittent headaches mainly in the right parietal and right occipital region. Some pain radiates from the neck up to the head. No nausea or vomiting. No photophobia or phonophobia. Sometimes he has sharp shooting brief pains. He rates pain severity 2-3/10. More recently headache symptoms have been more constant. No visual symptoms associated with headache. Separately patient has intermittent visual disturbance where he sees "swimming lines" in front of him, but these are not associated with headache. 10 days ago patient was at a  meeting talking to some people, when he had sudden onset of vertical double vision lasting for one to 2 minutes. He had mild queasiness sensation. No vomiting. He had mild equilibrium problem without falling out of his chair. No slurred speech. No numbness or tingling. Symptoms resolved on their own. No headaches associated with this.   REVIEW OF SYSTEMS: Full 14 system review of systems performed and negative except: trouble swallowing back pain neck pain.   ALLERGIES: Allergies  Allergen Reactions  . Flagyl [Metronidazole] Other (See Comments)    REACTION: no appetite, diarrhea after meal, decrease in weight  . Metoclopramide Hcl Other (See Comments)    REACTION: "involuntary movements"  . Septra [Sulfamethoxazole-Trimethoprim] Other (See Comments)    REACTION: "involuntary movements" tripac antibiotic- heart rythm    HOME MEDICATIONS: Outpatient Medications Prior to Visit  Medication Sig Dispense Refill  . ALPRAZolam (XANAX) 0.25 MG tablet Take 0.175-0.25 mg by mouth 2 (two) times daily as needed for sleep.     Marland Kitchen. amitriptyline (ELAVIL) 10 MG tablet Take 1 tablet (10 mg total) by mouth at bedtime. 30 tablet 6  . Cholecalciferol (VITAMIN D-3) 1000 units CAPS Take 2,000 Units by mouth daily.     . Cranberry 400 MG CAPS Take 400-1,200 mg by mouth every morning.     . Ginseng 100 MG CAPS Take by mouth.    . loratadine (CLARITIN) 10 MG tablet Take 10 mg by mouth every morning.    . Menaquinone-7 (VITAMIN K2 PO) Take 120-240 mg by mouth daily. Combo w/ Vit. D    . Milk Thistle 175 MG CAPS Take 1 capsule by mouth daily.     . montelukast (SINGULAIR) 10  MG tablet Take 1 tablet (10 mg total) by mouth at bedtime. 30 tablet 5  . Multiple Vitamin (MULTIVITAMIN) capsule Take 1 capsule by mouth every morning.     Marland Kitchen OVER THE COUNTER MEDICATION Take 3 capsules by mouth 2 (two) times daily. Lion's mane 0.5 gram/capsule    . OVER THE COUNTER MEDICATION Take 2 capsules by mouth every morning. Reparagen  supplement    . OVER THE COUNTER MEDICATION Take 1 tablet by mouth daily as needed (for eye migraine). Mygra Few    . OVER THE COUNTER MEDICATION CocoaVia flavonoids    . OVER THE COUNTER MEDICATION Sinus Clear Herb PO-- takes as needed    . Phosphatidylserine 100 MG CAPS Take 100 mg by mouth every morning.     . Probiotic Product (PROBIOTIC DAILY PO) Take by mouth.    . sodium chloride (MURO 128) 5 % ophthalmic ointment Place 1 application into both eyes 2 (two) times daily.     . THEANINE PO Take 2 tablets by mouth 2 (two) times daily.     . Turmeric, Curcuma Longa, (CURCUMIN) POWD 220 mg by Does not apply route.    . vitamin C (ASCORBIC ACID) 250 MG tablet Take 250 mg by mouth daily.    Marland Kitchen HYDROcodone-acetaminophen (NORCO/VICODIN) 5-325 MG tablet 1 po q d prn pain (Patient not taking: Reported on 04/19/2017) 30 tablet 0  . OVER THE COUNTER MEDICATION Aloe, Mucilaginous, Polysaccheride, L-glutinine--- takes x2 50mg  each twice daily for constipation    . OVER THE COUNTER MEDICATION Tranquil Sleep (5HDP, melatonin)  Takes bedtime as needed     No facility-administered medications prior to visit.     PAST MEDICAL HISTORY: Past Medical History:  Diagnosis Date  . Anxiety   . Arthritis   . Bladder calculus   . BPH (benign prostatic hyperplasia)   . Chronic constipation   . Complication of anesthesia    " I had some coughing afterwards for a couple of days"--  per pt "perfers spinal anesthesia since general anesthesia congnitive issues when older"  . Diverticulosis of colon   . Dry eye syndrome of both eyes   . Environmental allergies   . GERD (gastroesophageal reflux disease)    occasional  . History of adenomatous polyp of colon    08/ 2004  . History of kidney stones   . History of squamous cell carcinoma in situ (SCCIS) of skin    s/p  excision 2013 facial areas and 06/ 2016 nose  . Migraine    eye migraine occasional  . Seasonal and perennial allergic rhinitis   .  Thrombocytopenia (HCC)   . Tingling    hands and feet bilat , intermittantly-- per pt has lumbar bulging disk  . Urinary frequency   . Vocal fold atrophy    dysphonia-- per pt has to drink large amount of water to take even on pill  . Wears glasses     PAST SURGICAL HISTORY: Past Surgical History:  Procedure Laterality Date  . APPENDECTOMY  child  . CARDIOVASCULAR STRESS TEST  05-17-2015  dr hilty   Low risk nuclear study w/ no ischemia/  normal LV function and wall motion , stress ef 54% (lvef 45-54%)  . CHOLECYSTECTOMY N/A 09/29/2014   Procedure: LAPAROSCOPIC CHOLECYSTECTOMY WITH INTRAOPERATIVE CHOLANGIOGRAM;  Surgeon: Ovidio Kin, MD;  Location: [facility];  Service: General;  Laterality: N/A;  . COLONOSCOPY  last one 09-06-2010  . ESOPHAGOGASTRODUODENOSCOPY N/A 12/09/2013   Procedure: ESOPHAGOGASTRODUODENOSCOPY (EGD);  Surgeon: Chrissie Noa  Dulce Sellar, MD;  Location: [facility];  Service: Endoscopy;  Laterality: N/A;  . EXTRACORPOREAL SHOCK WAVE LITHOTRIPSY  yrs ago  . INGUINAL HERNIA REPAIR Left child  . NISSEN FUNDOPLICATION  1980's   open  . STONE EXTRACTION WITH BASKET N/A 08/18/2016   Procedure: STONE EXTRACTION WITH BASKET;  Surgeon: Jethro Bolus, MD;  Location: [facility];  Service: Urology;  Laterality: N/A;  . THULIUM LASER TURP (TRANSURETHRAL RESECTION OF PROSTATE) N/A 08/18/2016   Procedure: THULIUM LASER BLADDER NECK INCISION AND BLADDER STONE REMOVAL;  Surgeon: Jethro Bolus, MD;  Location: [facility];  Service: Urology;  Laterality: N/A;  . TONSILLECTOMY  child  . TRANSTHORACIC ECHOCARDIOGRAM  11/18/2010   grade 1 diastolic dysfunction, ef 55-60%/  trivial MR and TR/ mild dilated RA    FAMILY HISTORY: Family History  Problem Relation Age of Onset  . Parkinsonism Brother   . COPD Mother   . Allergies Mother   . Heart failure Mother   . COPD Father   . Stroke Father   . Other Sister   . Suicidality Maternal Aunt   . Cancer  Maternal Grandfather     SOCIAL HISTORY:  Social History   Socioeconomic History  . Marital status: Married    Spouse name: Gavin Pound  . Number of children: 1  . Years of education: [place], [place], [place]  . Highest education level: Not on file  Social Needs  . Financial resource strain: Not on file  . Food insecurity - worry: Not on file  . Food insecurity - inability: Not on file  . Transportation needs - medical: Not on file  . Transportation needs - non-medical: Not on file  Occupational History  . Occupation: teaches constitutional Social worker  . 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 Smoking status: Never Smoker  . Smokeless tobacco: Never Used  Substance and Sexual Activity  . Alcohol use: Yes    Comment: social  . Drug use: No  . Sexual activity: Not on file  Other Topics Concern  . Not on file  Social History Narrative   Patient lives at home wife.   Daily caffeine use     PHYSICAL EXAM  Vitals:   05/08/17 0923  BP: (!) 88/56  Pulse: 63  Weight: 117 lb 6.4 oz (53.3 kg)    Not recorded     Wt Readings from Last 3 Encounters:  05/08/17 117 lb 6.4 oz (53.3 kg)  12/30/16 125 lb (56.7 kg)  08/18/16 122 lb (55.3 kg)    No data found. Body mass index is 17.34 kg/m.  MMSE - Mini Mental State Exam 05/08/2017  Orientation to time 4  Orientation to Place 5  Registration 3  Attention/ Calculation 1  Recall 2  Language- name 2 objects 2  Language- repeat 1  Language- follow 3 step command 3  Language- read & follow direction 1  Write a sentence 1  Copy design 0  Total score 23    GENERAL EXAM: Patient is in no distress; well developed, nourished and groomed; neck is supple  CARDIOVASCULAR: Regular rate and rhythm, no murmurs, no carotid bruits  NEUROLOGIC: MENTAL STATUS: awake, alert, oriented to person, place and time, recent and remote memory intact, normal attention and concentration, language fluent, comprehension intact,  naming intact, fund of knowledge appropriate CRANIAL NERVE: no papilledema on fundoscopic exam, pupils equal and reactive to light, visual fields full to confrontation, extraocular muscles intact, no  nystagmus, facial sensation and strength symmetric, hearing intact, palate elevates symmetrically, uvula midline, shoulder shrug symmetric, tongue midline. MOTOR: normal bulk; INCREASED TONE IN LUE; BRADYKINESIA IN LUE AND LLE; POSTURAL TREMOR IN BUE; full strength in the BUE, BLE SENSORY: normal and symmetric to light touch, temperature, vibration  COORDINATION: MILD ACTION TREMOR WITH FINGER NOSE FINGER REFLEXES: deep tendon reflexes BRISK and symmetric GAIT/STATION: narrow based gait; STOOPED POSTURE; SHUFFLING GAIT; SLIGHTLY SPASTIC GAIT; DECR ARM SWING     DIAGNOSTIC DATA (LABS, IMAGING, TESTING) - I reviewed patient records, labs, notes, testing and imaging myself where available.  Lab Results  Component Value Date   WBC 5.4 04/19/2017   HGB 14.0 04/19/2017   HCT 39.7 04/19/2017   MCV 95.0 04/19/2017   PLT 100 (L) 04/19/2017      Component Value Date/Time   NA 136 04/19/2017 1606   K 3.9 04/19/2017 1606   CL 104 04/19/2017 1606   CO2 27 04/19/2017 1606   GLUCOSE 119 (H) 04/19/2017 1606   BUN 20 04/19/2017 1606   CREATININE 0.87 04/19/2017 1606   CALCIUM 8.9 04/19/2017 1606   PROT 6.3 (L) 11/20/2014 2140   ALBUMIN 3.9 11/20/2014 2140   AST 23 11/20/2014 2140   ALT 16 (L) 11/20/2014 2140   ALKPHOS 82 11/20/2014 2140   BILITOT 0.4 11/20/2014 2140   GFRNONAA >60 04/19/2017 1606   GFRAA >60 04/19/2017 1606   No results found for: CHOL No results found for: HGBA1C No results found for: VITAMINB12 No results found for: TSH  07/20/10 MRI CERVICAL 1. Left C6-C7 paracentral disc protrusion abutting the exiting left C7 nerve roots appears new. Preexisting mild left C7 foraminal stenosis at this level due to facet hypertrophy.  2. Mild combined congenital and acquired spinal  stenosis at the C3-C4 and C4-C5.  11/20/10 MRI brain (without contrast) demonstrating: 1. Moderate ventriculomegaly in the temporal and occipital horns.  Likely due to subcortical atrophy. 2. No acute findings are seen.  11/20/10 MRA head (without contrast) demonstrating: - mild atheromatous irregularities in the bilateral carotid siphon regions.  11/19/10 EMG/NCS - normal  11/19/10 carotid u/s - normal  11/18/10 TTE - EF 55-60%; normal wall motion  09/19/13 MRI brain (with and without) demonstrating: 1. Moderate mesial temporal atrophy. 2. Moderate ventriculomegaly on ex vacuo basis. 3. No acute findings.  11/11/16 MRI brain [I reviewed images myself and agree with interpretation. -VRP]  1. No acute intracranial process. 2. Progressed nonspecific moderate to severe parenchymal brain volume loss for age. No hydrocephalus.  02/02/17 MRI cervical spine [I reviewed images myself and agree with interpretation. -VRP]  1. At C3-C4, there is borderline spinal stenosis due to ligamentum flavum hypertrophy and congenitally short pedicles.   There is no nerve root compression. 2. At C4-C5, there is mild spinal stenosis due to disc bulging, ligament of flavum hypertrophy, facet hypertrophy, mild uncovertebral spurring and congenitally short pedicles. There is no nerve root compression. Degenerative changes at this level have slightly progressed compared to the 07/20/2010 MRI. 3. At C5-C6, there are degenerative changes no nerve root compression or spinal stenosis 4. At C6-C7, there are degenerative changes causing mild foraminal narrowing but no nerve root compression.   On the previous MRI there was a disc protrusion to the left that is not appreciated on the current study. 5. The spinal cord has normal signal.   ASSESSMENT AND PLAN  77 y.o. year old male here with chronic neck pain x 10 years, int HA x 1-2 years, with  a 2 minute episode of vertical double vision in June 2015, with no recurrence.    Also with gait diff and memory loss and weight loss.  Ddx: neurodegenerative (PD plus), stiff-person syndrome, B12 deficiency, deconditioning, muscle atrophy  Gait difficulty  Memory loss     PLAN:  GAIT DIFFICULTY / HYPERREFLEXIA / SPINAL STENOSIS  - check labs - conservative mgmt - PT evaluation - use cane / walker  WORD FINDING DIFFICULTY / MCI - MMSE 23/30; could be due to neurodegenerative process, MCI, or stress reaction; monitor and will repeat evaluation at next visit  MIGRAINE WITH AURA - improved; monitor for now (did not tolerated amitriptyline)  Orders Placed This Encounter  Procedures  . For home use only DME 4 wheeled rolling walker with seat  . Glutamic acid decarboxylase auto abs  . Vitamin B12  . Ambulatory referral to Physical Therapy   Return in about 6 months (around 11/05/2017).    Suanne Marker, MD 05/08/2017, 9:50 AM Certified in Neurology, Neurophysiology and Neuroimaging  [facility] [address], [address] [place], [place] [address] [phone]

## 2017-05-08 NOTE — Patient Instructions (Signed)
-   check labs  - refer to physical therapy  - use rollator walker or cane

## 2017-05-11 DIAGNOSIS — Z1389 Encounter for screening for other disorder: Secondary | ICD-10-CM | POA: Diagnosis not present

## 2017-05-11 DIAGNOSIS — R634 Abnormal weight loss: Secondary | ICD-10-CM | POA: Diagnosis not present

## 2017-05-11 DIAGNOSIS — F418 Other specified anxiety disorders: Secondary | ICD-10-CM | POA: Diagnosis not present

## 2017-05-11 DIAGNOSIS — Z0000 Encounter for general adult medical examination without abnormal findings: Secondary | ICD-10-CM | POA: Diagnosis not present

## 2017-05-11 DIAGNOSIS — G3184 Mild cognitive impairment, so stated: Secondary | ICD-10-CM | POA: Diagnosis not present

## 2017-05-11 DIAGNOSIS — N401 Enlarged prostate with lower urinary tract symptoms: Secondary | ICD-10-CM | POA: Diagnosis not present

## 2017-05-12 LAB — GLUTAMIC ACID DECARBOXYLASE AUTO ABS: Glutamic Acid Decarb Ab: <5 U/mL (ref 0.0–5.0)

## 2017-05-12 LAB — VITAMIN B12: Vitamin B-12: 711 pg/mL (ref 232–1245)

## 2017-05-13 ENCOUNTER — Telehealth: Payer: Self-pay | Admitting: *Deleted

## 2017-05-13 NOTE — Telephone Encounter (Signed)
LVM informing patient his lab results are normal. Left number for any questions. 

## 2017-05-25 ENCOUNTER — Other Ambulatory Visit: Payer: Self-pay

## 2017-05-25 ENCOUNTER — Encounter: Payer: Self-pay | Admitting: Rehabilitation

## 2017-05-25 ENCOUNTER — Ambulatory Visit: Payer: BLUE CROSS/BLUE SHIELD | Attending: Diagnostic Neuroimaging | Admitting: Rehabilitation

## 2017-05-25 ENCOUNTER — Telehealth: Payer: Self-pay | Admitting: Rehabilitation

## 2017-05-25 DIAGNOSIS — R2689 Other abnormalities of gait and mobility: Secondary | ICD-10-CM | POA: Insufficient documentation

## 2017-05-25 DIAGNOSIS — R2681 Unsteadiness on feet: Secondary | ICD-10-CM | POA: Diagnosis not present

## 2017-05-25 DIAGNOSIS — R293 Abnormal posture: Secondary | ICD-10-CM | POA: Insufficient documentation

## 2017-05-25 DIAGNOSIS — M6281 Muscle weakness (generalized): Secondary | ICD-10-CM | POA: Insufficient documentation

## 2017-05-25 DIAGNOSIS — M5441 Lumbago with sciatica, right side: Secondary | ICD-10-CM

## 2017-05-25 NOTE — Telephone Encounter (Signed)
Dr. Marjory LiesPenumalli,   I am seeing [name] at [facility] for PT.  During his evaluation, he states he has had two falls in the last 3 months and describes low back pain with some radiating symptoms into RLE.  Note there is no lumbar spine imaging since 2014 and wondered if you felt an MRI might be warranted for lumbar spine?  Given his age and history of osteoporosis, I just wanted to make sure it was clear before we continued with exercise.    Thanks,  Harriet ButteEmily Meryn Sarracino, PT, MPT [facility] [address] [place], [place], [id number] Phone: [phone]   Fax:  [phone] 05/25/17, 2:22 PM

## 2017-05-25 NOTE — Therapy (Signed)
[facility] [address] [place], [place], [address] Phone: [phone]   Fax:  [phone]  Physical Therapy Evaluation  Patient Details  Name: Lee Nelson MRN: [id number] Date of Birth: 1940-04-09 Referring Provider: Joycelyn SchmidVikram Penumalli, MD   Encounter Date: 05/25/2017  PT End of Session - 05/25/17 1258    Visit Number  1    Number of Visits  17    Date for PT Re-Evaluation  07/24/17    Authorization Type  BCBS    PT Start Time  0846    PT Stop Time  0933    PT Time Calculation (min)  47 min    Activity Tolerance  Patient tolerated treatment well    Behavior During Therapy  [facility] for tasks assessed/performed       Past Medical History:  Diagnosis Date  . Anxiety   . Arthritis   . Bladder calculus   . BPH (benign prostatic hyperplasia)   . Chronic constipation   . Complication of anesthesia    " I had some coughing afterwards for a couple of days"--  per pt "perfers spinal anesthesia since general anesthesia congnitive issues when older"  . Diverticulosis of colon   . Dry eye syndrome of both eyes   . Environmental allergies   . GERD (gastroesophageal reflux disease)    occasional  . History of adenomatous polyp of colon    08/ 2004  . History of kidney stones   . History of squamous cell carcinoma in situ (SCCIS) of skin    s/p  excision 2013 facial areas and 06/ 2016 nose  . Migraine    eye migraine occasional  . Seasonal and perennial allergic rhinitis   . Thrombocytopenia (HCC)   . Tingling    hands and feet bilat , intermittantly-- per pt has lumbar bulging disk  . Urinary frequency   . Vocal fold atrophy    dysphonia-- per pt has to drink large amount of water to take even on pill  . Wears glasses     Past Surgical History:  Procedure Laterality Date  . APPENDECTOMY  child  . CARDIOVASCULAR STRESS TEST  05-17-2015  dr hilty   Low risk nuclear study w/ no ischemia/  normal LV function and  wall motion , stress ef 54% (lvef 45-54%)  . CHOLECYSTECTOMY N/A 09/29/2014   Procedure: LAPAROSCOPIC CHOLECYSTECTOMY WITH INTRAOPERATIVE CHOLANGIOGRAM;  Surgeon: Ovidio Kinavid Newman, MD;  Location: [facility];  Service: General;  Laterality: N/A;  . COLONOSCOPY  last one 09-06-2010  . ESOPHAGOGASTRODUODENOSCOPY N/A 12/09/2013   Procedure: ESOPHAGOGASTRODUODENOSCOPY (EGD);  Surgeon: Willis ModenaWilliam Outlaw, MD;  [facility];  Service: Endoscopy;  Laterality: N/A;  . EXTRACORPOREAL SHOCK WAVE LITHOTRIPSY  yrs ago  . INGUINAL HERNIA REPAIR Left child  . NISSEN FUNDOPLICATION  1980's   open  . STONE EXTRACTION WITH BASKET N/A 08/18/2016   Procedure: STONE EXTRACTION WITH BASKET;  Surgeon: Jethro Bolusannenbaum, Sigmund, MD;  [facility] [place];  Service: Urology;  Laterality: N/A;  . THULIUM LASER TURP (TRANSURETHRAL RESECTION OF PROSTATE) N/A 08/18/2016   Procedure: THULIUM LASER BLADDER NECK INCISION AND BLADDER STONE REMOVAL;  Surgeon: Jethro Bolusannenbaum, Sigmund, MD;  [facility] [place];  Service: Urology;  Laterality: N/A;  . TONSILLECTOMY  child  . TRANSTHORACIC ECHOCARDIOGRAM  11/18/2010   grade 1 diastolic dysfunction, ef 55-60%/  trivial MR and TR/ mild dilated RA    There were no vitals filed for this visit.   Subjective Assessment -  05/25/17 0849    Subjective  "I've had two falls, both of which were unexplained.  I saw Dr. Marjory Lies and he thought it would be a good idea for me to come see you.  I have made some self corrections along the way as I have always been a shuffler, so I try to hit with my heels when I walk."   "I also have some hip pain that wakes me up most nights."     Pertinent History  memory loss, osteoperosis, degenerative disc disease in cervical spine    Limitations  House hold activities;Walking    Patient Stated Goals  "To get rid of the hip pain and figure out whats going on with my balance."     Currently in Pain?  Yes    Pain Score  2     Pain Location   Back    Pain Orientation  Right R back into hip    Pain Descriptors / Indicators  Aching;Dull    Pain Type  Chronic pain    Pain Onset  More than a month ago    Pain Frequency  Intermittent    Aggravating Factors   at night, cold weather    Pain Relieving Factors  Tylenol, moving around         [facility] PT Assessment - 05/25/17 0857      Assessment   Medical Diagnosis  gait instability    Referring Provider  Joycelyn Schmid, MD    Onset Date/Surgical Date  -- Notes decrease in balance over last 3 years    Prior Therapy  PT in past for neck, SLP for swallowing difficulty approx 1 year ago      Precautions   Precautions  Fall      Balance Screen   Has the patient fallen in the past 6 months  Yes    How many times?  2    Has the patient had a decrease in activity level because of a fear of falling?   Yes    Is the patient reluctant to leave their home because of a fear of falling?   Yes      Home Environment   Living Environment  Private residence    Living Arrangements  Spouse/significant other    Available Help at Discharge  Family;Personal care attendant;Available 24 hours/day caregivers for wife with quadriplegia    Type of Home  House    Home Access  Stairs to enter    Entrance Stairs-Number of Steps  1 then 1, then 3     Entrance Stairs-Rails  None    Home Layout  Two level;Able to live on main level with bedroom/bathroom    Alternate Level Stairs-Number of Steps  12    Alternate Level Stairs-Rails  Right    Home Equipment  Cane - single point walk in shower w/ single step    Additional Comments  uses back support when sitting in chair      Prior Function   Level of Independence  Independent    Vocation  Full time employment    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)    Leisure  I used to enjoy taking walks, playing with new dog      Cognition   Overall Cognitive Status  Impaired/Different from baseline notes decreased memory, word finding       Sensation   Light Touch  Impaired Detail    Light Touch Impaired Details  Impaired  RLE;Impaired LLE more on R (up to thigh), only in L foot    Hot/Cold  Appears Intact    Proprioception  Appears Intact      Coordination   Gross Motor Movements are Fluid and Coordinated  Yes    Fine Motor Movements are Fluid and Coordinated  Yes      Posture/Postural Control   Posture/Postural Control  Postural limitations    Postural Limitations  Rounded Shoulders;Forward head;Increased thoracic kyphosis;Flexed trunk    Posture Comments  Uses back support when sitting in chair      ROM / Strength   AROM / PROM / Strength  Strength      Strength   Overall Strength  Deficits    Overall Strength Comments  R hip flex (seated) 3/5, L hip flex 4/5, B knee ext 4/5, B knee flex 4/5, R DF 3/5, L DF 4/5, B ankle PF 4/5      Transfers   Transfers  Sit to Stand;Stand to Sit    Sit to Stand  7: Independent    Five time sit to stand comments   8.71 secs without UE support, note some use of legs on back on chair    Stand to Sit  7: Independent      Ambulation/Gait   Ambulation/Gait  Yes    Ambulation/Gait Assistance  6: Modified independent (Device/Increase time);5: Supervision;4: Min guard S to min/guard with balance challenges    Ambulation/Gait Assistance Details  Forward flexed gait, slight shuffling noted.  He reports he is trying to correct this when he ambulates.     Ambulation Distance (Feet)  300 Feet    Assistive device  None    Gait Pattern  Step-through pattern;Decreased stride length;Right flexed knee in stance;Left flexed knee in stance;Shuffle;Trunk flexed    Ambulation Surface  Level;Indoor    Gait velocity  2.98 ft/sec without AD    Stairs  Yes    Stairs Assistance  6: Modified independent (Device/Increase time)    Stair Management Technique  One rail Right;Alternating pattern;Forwards    Number of Stairs  4    Height of Stairs  6      Functional Gait  Assessment   Gait assessed   Yes     Gait Level Surface  Walks 20 ft in less than 7 sec but greater than 5.5 sec, uses assistive device, slower speed, mild gait deviations, or deviates 6-10 in outside of the 12 in walkway width.    Change in Gait Speed  Able to change speed, demonstrates mild gait deviations, deviates 6-10 in outside of the 12 in walkway width, or no gait deviations, unable to achieve a major change in velocity, or uses a change in velocity, or uses an assistive device.    Gait with Horizontal Head Turns  Performs head turns smoothly with slight change in gait velocity (eg, minor disruption to smooth gait path), deviates 6-10 in outside 12 in walkway width, or uses an assistive device.    Gait with Vertical Head Turns  Performs task with slight change in gait velocity (eg, minor disruption to smooth gait path), deviates 6 - 10 in outside 12 in walkway width or uses assistive device    Gait and Pivot Turn  Pivot turns safely within 3 sec and stops quickly with no loss of balance.    Step Over Obstacle  Is able to step over one shoe box (4.5 in total height) without changing gait speed. No evidence of imbalance.  Gait with Narrow Base of Support  Is able to ambulate for 10 steps heel to toe with no staggering.    Gait with Eyes Closed  Walks 20 ft, uses assistive device, slower speed, mild gait deviations, deviates 6-10 in outside 12 in walkway width. Ambulates 20 ft in less than 9 sec but greater than 7 sec.    Ambulating Backwards  Walks 20 ft, uses assistive device, slower speed, mild gait deviations, deviates 6-10 in outside 12 in walkway width.    Steps  Alternating feet, must use rail.    Total Score  22    FGA comment:  19-24 = medium risk fall             Objective measurements completed on examination: See above findings.              PT Education - 05/25/17 1258    Education provided  Yes    Education Details  evaluation findings, POC, goals.     Person(s) Educated  Patient    Methods   Explanation    Comprehension  Verbalized understanding       PT Short Term Goals - 05/25/17 1305      PT SHORT TERM GOAL #1   Title  Pt will initiate HEP in order to indicate decreased fall risk and improved functional mobility.  (Target Date: 06/24/17)    Time  4    Period  Weeks    Status  New    Target Date  06/24/17      PT SHORT TERM GOAL #2   Title  Pt will improve FGA to 25/30 in order to indicate decreased fall risk.      Time  4    Period  Weeks    Status  New      PT SHORT TERM GOAL #3   Title  Pt will ambulate with gait speed >/=3.58 ft/sec in order to indicate more normal gait speed.      Time  4    Period  Weeks    Status  New      PT SHORT TERM GOAL #4   Title  Pt will ambulate up to 500' at independent level with improved posture and decreased shuffled gait in order to indicate decreased fall risk.      Time  4    Period  Weeks    Status  New      PT SHORT TERM GOAL #5   Title  Will assess SOT and write goals as indicated.         PT Long Term Goals - 05/25/17 1314      PT LONG TERM GOAL #1   Title  Pt will be independent with HEP in order to indicate decreased fall risk and improved functional mobility.  (Target Date: 07/24/17)    Time  8    Period  Weeks    Status  New      PT LONG TERM GOAL #2   Title  Pt will improve FGA to 28/30 in order to indicate decreased fall risk.      Time  8    Period  Weeks    Status  New      PT LONG TERM GOAL #3   Title  Pt will ambulate >1000' independently over varying outdoor surfaces (including curb step and grass) in order to indicate return to community and leisure activity.     Time  8  Period  Weeks    Status  New      PT LONG TERM GOAL #4   Title  Will add SOT goal as it is assessed    Time  8    Period  Weeks    Status  New             Plan - 05/25/17 1259    Clinical Impression Statement  Pt presents with declining balance over last 2-3 years with two falls noted in the last three  months.  Pt reports he is unaware of how these falls occured but that he "just fell."  Note he has history of cervical spine DDD, osteoperosis, fibromyalgia and recent memory loss that could impact progress in therapy.  Note that he has not had images done since last two falls, but reports some tingling sensations that go all the way down RLE.  Upon PT evaluation, note BLE generalized weakness, poor posture, gait speed of 2.98 ft/sec, lower than what is normal gait speed, but above fall risk speed, gait mechanics that could lead to increased falls such as forward flexed posture and shuffling gait pattern, and FGA score of 22/30 indicative of moderate fall risk.  Pt will benefit from skilled OP neuro PT in order to address deficits.      History and Personal Factors relevant to plan of care:  see above    Clinical Presentation  Evolving    Clinical Presentation due to:  see above    Clinical Decision Making  Moderate    Rehab Potential  Good    Clinical Impairments Affecting Rehab Potential  co-morbidities    PT Frequency  2x / week    PT Duration  8 weeks    PT Treatment/Interventions  ADLs/Self Care Home Management;DME Instruction;Gait training;Stair training;Functional mobility training;Therapeutic activities;Therapeutic exercise;Balance training;Neuromuscular re-education;Patient/family education;Orthotic Fit/Training;Passive range of motion;Vestibular    PT Next Visit Plan  SOT, Initiate HEP for BLE strengthening, core strengthening and balance, look more into vestibular deficits (likely a hypofunction)    Consulted and Agree with Plan of Care  Patient       Patient will benefit from skilled therapeutic intervention in order to improve the following deficits and impairments:  Decreased balance, Decreased mobility, Decreased range of motion, Decreased strength, Abnormal gait, Impaired flexibility, Impaired perceived functional ability, Impaired sensation, Improper body mechanics, Postural  dysfunction  Visit Diagnosis: Unsteadiness on feet - Plan: PT plan of care cert/re-cert  Muscle weakness (generalized) - Plan: PT plan of care cert/re-cert  Other abnormalities of gait and mobility - Plan: PT plan of care cert/re-cert  Abnormal posture - Plan: PT plan of care cert/re-cert     Problem List Patient Active Problem List   Diagnosis Date Noted  . PSVT (paroxysmal supraventricular tachycardia) (HCC) 09/12/2015  . Chest pain 05/03/2015  . Dyspnea 05/03/2015  . ALLERGIC RHINITIS 09/21/2007  . G E R D 09/21/2007  . SMOKE INHALATION 09/21/2007  . OSTEOPOROSIS 01/28/2007  . PALPITATIONS 01/28/2007  . COUGH 01/28/2007  . ALLERGY 01/28/2007    Harriet Butte, PT, MPT [facility] [address] [place], [place], [address] Phone: [phone]   Fax:  [phone] 05/25/17, 2:19 PM  Name: LANKFORD GUTZMER MRN: [id number] Date of Birth: 22-Dec-1940

## 2017-05-26 NOTE — Telephone Encounter (Signed)
I will check MRI to rule out compression fracture. -VRP

## 2017-06-02 ENCOUNTER — Encounter: Payer: Self-pay | Admitting: Physical Therapy

## 2017-06-02 ENCOUNTER — Ambulatory Visit: Payer: BLUE CROSS/BLUE SHIELD | Attending: Diagnostic Neuroimaging | Admitting: Physical Therapy

## 2017-06-02 DIAGNOSIS — R41841 Cognitive communication deficit: Secondary | ICD-10-CM | POA: Diagnosis not present

## 2017-06-02 DIAGNOSIS — R2689 Other abnormalities of gait and mobility: Secondary | ICD-10-CM | POA: Insufficient documentation

## 2017-06-02 DIAGNOSIS — M6281 Muscle weakness (generalized): Secondary | ICD-10-CM | POA: Insufficient documentation

## 2017-06-02 DIAGNOSIS — R293 Abnormal posture: Secondary | ICD-10-CM | POA: Insufficient documentation

## 2017-06-02 DIAGNOSIS — R2681 Unsteadiness on feet: Secondary | ICD-10-CM | POA: Diagnosis not present

## 2017-06-02 NOTE — Therapy (Signed)
[facility] [address] [place], [place], [address] Phone: [phone]   Fax:  [phone]  Physical Therapy Treatment  Patient Details  Name: Lee Nelson MRN: [id number] Date of Birth: 10-Jul-1940 Referring Provider: Joycelyn SchmidVikram Penumalli, MD   Encounter Date: 06/02/2017  PT End of Session - 06/02/17 1646    Visit Number  2    Number of Visits  17    Date for PT Re-Evaluation  07/24/17    Authorization Type  [employer]    PT Start Time  1534    PT Stop Time  1615    PT Time Calculation (min)  41 min    Equipment Utilized During Treatment  Gait belt    Activity Tolerance  Patient tolerated treatment well    Behavior During Therapy  [facility] for tasks assessed/performed       Past Medical History:  Diagnosis Date  . Anxiety   . Arthritis   . Bladder calculus   . BPH (benign prostatic hyperplasia)   . Chronic constipation   . Complication of anesthesia    " I had some coughing afterwards for a couple of days"--  per pt "perfers spinal anesthesia since general anesthesia congnitive issues when older"  . Diverticulosis of colon   . Dry eye syndrome of both eyes   . Environmental allergies   . GERD (gastroesophageal reflux disease)    occasional  . History of adenomatous polyp of colon    08/ 2004  . History of kidney stones   . History of squamous cell carcinoma in situ (SCCIS) of skin    s/p  excision 2013 facial areas and 06/ 2016 nose  . Migraine    eye migraine occasional  . Seasonal and perennial allergic rhinitis   . Thrombocytopenia (HCC)   . Tingling    hands and feet bilat , intermittantly-- per pt has lumbar bulging disk  . Urinary frequency   . Vocal fold atrophy    dysphonia-- per pt has to drink large amount of water to take even on pill  . Wears glasses     Past Surgical History:  Procedure Laterality Date  . APPENDECTOMY  child  . CARDIOVASCULAR STRESS TEST  05-17-2015  dr hilty   Low risk  nuclear study w/ no ischemia/  normal LV function and wall motion , stress ef 54% (lvef 45-54%)  . CHOLECYSTECTOMY N/A 09/29/2014   Procedure: LAPAROSCOPIC CHOLECYSTECTOMY WITH INTRAOPERATIVE CHOLANGIOGRAM;  Surgeon: Ovidio Kinavid Newman, MD;  Location: [facility];  Service: General;  Laterality: N/A;  . COLONOSCOPY  last one 09-06-2010  . ESOPHAGOGASTRODUODENOSCOPY N/A 12/09/2013   Procedure: ESOPHAGOGASTRODUODENOSCOPY (EGD);  Surgeon: Willis ModenaWilliam Outlaw, MD;  [facility];  Service: Endoscopy;  Laterality: N/A;  . EXTRACORPOREAL SHOCK WAVE LITHOTRIPSY  yrs ago  . INGUINAL HERNIA REPAIR Left child  . NISSEN FUNDOPLICATION  1980's   open  . STONE EXTRACTION WITH BASKET N/A 08/18/2016   Procedure: STONE EXTRACTION WITH BASKET;  Surgeon: Jethro Bolusannenbaum, Sigmund, MD;  [facility];  Service: Urology;  Laterality: N/A;  . THULIUM LASER TURP (TRANSURETHRAL RESECTION OF PROSTATE) N/A 08/18/2016   Procedure: THULIUM LASER BLADDER NECK INCISION AND BLADDER STONE REMOVAL;  Surgeon: Jethro Bolusannenbaum, Sigmund, MD;  [facility] [place];  Service: Urology;  Laterality: N/A;  . TONSILLECTOMY  child  . TRANSTHORACIC ECHOCARDIOGRAM  11/18/2010   grade 1 diastolic dysfunction, ef 55-60%/  trivial MR and TR/ mild dilated RA    There were no  vitals filed for this visit.  Subjective Assessment - 06/02/17 1537    Subjective  Pt stated no changes in medications and no reported falls. Pt stated, "today he had minor neck and back pain." Also stated, "I get a little dizzy when turn my head up and down."       Pertinent History  memory loss, osteoperosis, degenerative disc disease in cervical spine    Limitations  House hold activities;Walking    Patient Stated Goals  "To get rid of the hip pain and figure out whats going on with my balance."     Currently in Pain?  Yes    Pain Score  1     Pain Location  Neck    Pain Orientation  Posterior    Pain Descriptors / Indicators  Aching;Dull     Pain Type  Chronic pain    Pain Onset  More than a month ago    Pain Frequency  Intermittent    Aggravating Factors   at night, cold weather    Pain Relieving Factors  Tylenol, moving around    Multiple Pain Sites  Yes    Pain Score  1    Pain Location  Back    Pain Orientation  Lower    Pain Descriptors / Indicators  Aching;Discomfort    Pain Radiating Towards  General low back pain reported.     Pain Onset  More than a month ago    Pain Frequency  Intermittent    Aggravating Factors   Standing for long periods of time.     Pain Relieving Factors  Sitting down in a chair with supported back support.        Treatment: focused on performance of SOT and issuance of HEP. Neuro re-ed: sensory organization test performed with following results: Conditions: 1: All three above normal.  2: All three above normal.  3: All three above normal.   4: First below normal; next two normal. 5: First two normal; third below normal. 6: First two falls; third one normal.  Composite score: 63, just below pt norm of ~68-70 Sensory Analysis Som: Normal  Vis: Normal  Vest: Normal  Pref: Normal Strategy analysis: no preference noted on test      COG alignment: left lateral preference     Provided patient with verbal and demonstration of HEP. Provided pt copy of handouts. Cues needed for posture, correct performance.   Feet Together (Compliant Surface) Varied Arm Positions - Eyes Closed    Stand on compliant surface: __Place______ with feet together and arms out. Close eyes and visualize upright position. Hold___20_ seconds. Repeat _2-3___ times per session. Do __2__ sessions per day.  Copyright  VHI. All rights reserved.  Feet Apart (Compliant Surface) Head Motion - Eyes Closed    Stand on compliant surface: ____Stand____ with feet shoulder width apart. Close eyes and move head slowly, up and down. Repeat _2-3___ times per session. Do _2___ sessions per day.  Copyright  VHI. All rights  reserved.     PT Education - 06/02/17 1644    Education provided  Yes    Education Details  HEP: Feet together with eyes closed standing on pillow; Feet apart with head motion, EC while standing on pillow.     Person(s) Educated  Patient    Methods  Explanation;Demonstration    Comprehension  Verbalized understanding       PT Short Term Goals - 05/25/17 1305      PT SHORT TERM  GOAL #1   Title  Pt will initiate HEP in order to indicate decreased fall risk and improved functional mobility.  (Target Date: 06/24/17)    Time  4    Period  Weeks    Status  New    Target Date  06/24/17      PT SHORT TERM GOAL #2   Title  Pt will improve FGA to 25/30 in order to indicate decreased fall risk.      Time  4    Period  Weeks    Status  New      PT SHORT TERM GOAL #3   Title  Pt will ambulate with gait speed >/=3.58 ft/sec in order to indicate more normal gait speed.      Time  4    Period  Weeks    Status  New      PT SHORT TERM GOAL #4   Title  Pt will ambulate up to 500' at independent level with improved posture and decreased shuffled gait in order to indicate decreased fall risk.      Time  4    Period  Weeks    Status  New      PT SHORT TERM GOAL #5   Title  Will assess SOT and write goals as indicated.         PT Long Term Goals - 05/25/17 1314      PT LONG TERM GOAL #1   Title  Pt will be independent with HEP in order to indicate decreased fall risk and improved functional mobility.  (Target Date: 07/24/17)    Time  8    Period  Weeks    Status  New      PT LONG TERM GOAL #2   Title  Pt will improve FGA to 28/30 in order to indicate decreased fall risk.      Time  8    Period  Weeks    Status  New      PT LONG TERM GOAL #3   Title  Pt will ambulate >1000' independently over varying outdoor surfaces (including curb step and grass) in order to indicate return to community and leisure activity.     Time  8    Period  Weeks    Status  New      PT LONG TERM GOAL  #4   Title  Will add SOT goal as it is assessed    Time  8    Period  Weeks    Status  New            Plan - 06/02/17 1648    Clinical Impression Statement  Today's skilled PT session consisted of performing a Sensory Organization Test to analyze pt's three balance systems. The test stated that overall equilibrum score was 63, a number or two from normal for patients age. Three sensory analysis systems we're normal. Pt was also instructed on verbally and by demonstration on new home exercise program consisting of feet apart with eyes closed with head turns and narrow BOS with eyes closed with head turns while standing on an uneven surface for inmproved balance. Pt is beginning progress towards STG of HEP goal. Pt would benefit from skilled PT towards meeting unmet STG's.     History and Personal Factors relevant to plan of care:  see above    Clinical Presentation  Evolving    Clinical Presentation due to:  see above    Clinical  Decision Making  Moderate    Rehab Potential  Good    Clinical Impairments Affecting Rehab Potential  co-morbidities    PT Frequency  2x / week    PT Duration  8 weeks    PT Treatment/Interventions  ADLs/Self Care Home Management;DME Instruction;Gait training;Stair training;Functional mobility training;Therapeutic activities;Therapeutic exercise;Balance training;Neuromuscular re-education;Patient/family education;Orthotic Fit/Training;Passive range of motion;Vestibular    PT Next Visit Plan  Follow up on HEP at home given in today's session. Also plan to progress static and balance exercises. Consider incorporating BLE strengthening and core strengthening exercises for HEP if appropriate.    Consulted and Agree with Plan of Care  Patient       Patient will benefit from skilled therapeutic intervention in order to improve the following deficits and impairments:  Decreased balance, Decreased mobility, Decreased range of motion, Decreased strength, Abnormal gait,  Impaired flexibility, Impaired perceived functional ability, Impaired sensation, Improper body mechanics, Postural dysfunction  Visit Diagnosis: Unsteadiness on feet  Muscle weakness (generalized)  Other abnormalities of gait and mobility  Abnormal posture     Problem List Patient Active Problem List   Diagnosis Date Noted  . PSVT (paroxysmal supraventricular tachycardia) (HCC) 09/12/2015  . Chest pain 05/03/2015  . Dyspnea 05/03/2015  . ALLERGIC RHINITIS 09/21/2007  . G E R D 09/21/2007  . SMOKE INHALATION 09/21/2007  . OSTEOPOROSIS 01/28/2007  . PALPITATIONS 01/28/2007  . COUGH 01/28/2007  . ALLERGY 01/28/2007    Dorian Pod 06/02/2017, 5:17 PM  [facility] [address] [place], [place], [address] Phone: [phone]   Fax:  [phone]  Name: Lee Nelson MRN: [id number] Date of Birth: 1940/07/23

## 2017-06-02 NOTE — Patient Instructions (Addendum)
Feet Together (Compliant Surface) Varied Arm Positions - Eyes Closed    Stand on compliant surface: __Place______ with feet together and arms out. Close eyes and visualize upright position. Hold___20_ seconds. Repeat _2-3___ times per session. Do __2__ sessions per day.  Copyright  VHI. All rights reserved.  Feet Apart (Compliant Surface) Head Motion - Eyes Closed    Stand on compliant surface: ____Stand____ with feet shoulder width apart. Close eyes and move head slowly, up and down. Repeat _2-3___ times per session. Do _2___ sessions per day.  Copyright  VHI. All rights reserved.

## 2017-06-03 DIAGNOSIS — M6281 Muscle weakness (generalized): Secondary | ICD-10-CM | POA: Diagnosis not present

## 2017-06-03 DIAGNOSIS — R350 Frequency of micturition: Secondary | ICD-10-CM | POA: Diagnosis not present

## 2017-06-03 DIAGNOSIS — M62838 Other muscle spasm: Secondary | ICD-10-CM | POA: Diagnosis not present

## 2017-06-03 DIAGNOSIS — R3915 Urgency of urination: Secondary | ICD-10-CM | POA: Diagnosis not present

## 2017-06-04 DIAGNOSIS — R140 Abdominal distension (gaseous): Secondary | ICD-10-CM | POA: Diagnosis not present

## 2017-06-05 ENCOUNTER — Ambulatory Visit: Payer: BLUE CROSS/BLUE SHIELD | Admitting: Rehabilitation

## 2017-06-09 ENCOUNTER — Ambulatory Visit: Payer: BLUE CROSS/BLUE SHIELD | Admitting: Physical Therapy

## 2017-06-09 ENCOUNTER — Telehealth: Payer: Self-pay | Admitting: Diagnostic Neuroimaging

## 2017-06-09 ENCOUNTER — Encounter: Payer: Self-pay | Admitting: Physical Therapy

## 2017-06-09 VITALS — BP 98/55 | HR 56

## 2017-06-09 DIAGNOSIS — F809 Developmental disorder of speech and language, unspecified: Secondary | ICD-10-CM

## 2017-06-09 DIAGNOSIS — R41841 Cognitive communication deficit: Secondary | ICD-10-CM | POA: Diagnosis not present

## 2017-06-09 DIAGNOSIS — M6281 Muscle weakness (generalized): Secondary | ICD-10-CM | POA: Diagnosis not present

## 2017-06-09 DIAGNOSIS — R293 Abnormal posture: Secondary | ICD-10-CM | POA: Diagnosis not present

## 2017-06-09 DIAGNOSIS — R2681 Unsteadiness on feet: Secondary | ICD-10-CM | POA: Diagnosis not present

## 2017-06-09 DIAGNOSIS — R2689 Other abnormalities of gait and mobility: Secondary | ICD-10-CM | POA: Diagnosis not present

## 2017-06-09 NOTE — Therapy (Signed)
[facility] [address] [place], [place], [address] Phone: [phone]   Fax:  [phone]  Physical Therapy Treatment  Patient Details  Name: MALOSI HEMSTREET MRN: [id number] Date of Birth: 11-13-1940 Referring Provider: Joycelyn Schmid, MD   Encounter Date: 06/09/2017  PT End of Session - 06/09/17 1516    Visit Number  3    Number of Visits  17    Date for PT Re-Evaluation  07/24/17    Authorization Type  BCBS    PT Start Time  1015    PT Stop Time  1100    PT Time Calculation (min)  45 min    Equipment Utilized During Treatment  Gait belt    Activity Tolerance  Patient tolerated treatment well    Behavior During Therapy  [facility] for tasks assessed/performed       Past Medical History:  Diagnosis Date  . Anxiety   . Arthritis   . Bladder calculus   . BPH (benign prostatic hyperplasia)   . Chronic constipation   . Complication of anesthesia    " I had some coughing afterwards for a couple of days"--  per pt "perfers spinal anesthesia since general anesthesia congnitive issues when older"  . Diverticulosis of colon   . Dry eye syndrome of both eyes   . Environmental allergies   . GERD (gastroesophageal reflux disease)    occasional  . History of adenomatous polyp of colon    08/ 2004  . History of kidney stones   . History of squamous cell carcinoma in situ (SCCIS) of skin    s/p  excision 2013 facial areas and 06/ 2016 nose  . Migraine    eye migraine occasional  . Seasonal and perennial allergic rhinitis   . Thrombocytopenia (HCC)   . Tingling    hands and feet bilat , intermittantly-- per pt has lumbar bulging disk  . Urinary frequency   . Vocal fold atrophy    dysphonia-- per pt has to drink large amount of water to take even on pill  . Wears glasses     Past Surgical History:  Procedure Laterality Date  . APPENDECTOMY  child  . CARDIOVASCULAR STRESS TEST  05-17-2015  dr hilty   Low risk  nuclear study w/ no ischemia/  normal LV function and wall motion , stress ef 54% (lvef 45-54%)  . CHOLECYSTECTOMY N/A 09/29/2014   Procedure: LAPAROSCOPIC CHOLECYSTECTOMY WITH INTRAOPERATIVE CHOLANGIOGRAM;  Surgeon: Ovidio Kin, MD;  Location: [facility];  Service: General;  Laterality: N/A;  . COLONOSCOPY  last one 09-06-2010  . ESOPHAGOGASTRODUODENOSCOPY N/A 12/09/2013   Procedure: ESOPHAGOGASTRODUODENOSCOPY (EGD);  Surgeon: Willis Modena, MD;  Location: [facility] ENDOSCOPY;  Service: Endoscopy;  Laterality: N/A;  . EXTRACORPOREAL SHOCK WAVE LITHOTRIPSY  yrs ago  . INGUINAL HERNIA REPAIR Left child  . NISSEN FUNDOPLICATION  1980's   open  . STONE EXTRACTION WITH BASKET N/A 08/18/2016   Procedure: STONE EXTRACTION WITH BASKET;  Surgeon: Jethro Bolus, MD;  Location: [facility];  Service: Urology;  Laterality: N/A;  . THULIUM LASER TURP (TRANSURETHRAL RESECTION OF PROSTATE) N/A 08/18/2016   Procedure: THULIUM LASER BLADDER NECK INCISION AND BLADDER STONE REMOVAL;  Surgeon: Jethro Bolus, MD;  Location: [facility];  Service: Urology;  Laterality: N/A;  . TONSILLECTOMY  child  . TRANSTHORACIC ECHOCARDIOGRAM  11/18/2010   grade 1 diastolic dysfunction, ef 55-60%/  trivial MR and TR/ mild dilated RA    Vitals:  06/09/17 1017 06/09/17 1022 06/09/17 1148  BP: (!) 92/48 with machine (!) 100/51 manual recheck (!) 98/55 with machine after nustep  Pulse: (!) 56      Subjective Assessment - 06/09/17 1017    Subjective  Pt reported no changes in medications and no falls. Pt stated, "today he had minor neck and low back pain. Pt also stated, "I feel like I have low blood pressure since yesterday when I saw my     Pertinent History  memory loss, osteoperosis, degenerative disc disease in cervical spine    Limitations  House hold activities;Walking    Currently in Pain?  Yes    Pain Score  1     Pain Location  Neck    Pain Orientation  Posterior    Pain Descriptors /  Indicators  Aching;Dull    Pain Type  Chronic pain    Pain Onset  More than a month ago    Pain Frequency  Intermittent    Aggravating Factors   at night, cold weather    Pain Relieving Factors  tylenol, moving around    Multiple Pain Sites  Yes    Pain Score  1    Pain Location  Back    Pain Orientation  Lower    Pain Descriptors / Indicators  Aching;Discomfort    Pain Radiating Towards  General low back pain reported.    Pain Onset  More than a month ago    Pain Frequency  Intermittent    Aggravating Factors   Standing for long periods of time.    Pain Relieving Factors  Sitting down in a chair with supported back support.               [facility] Adult PT Treatment/Exercise - 06/09/17 1501      Self-Care   Self-Care  Other Self-Care Comments Pt was given self-care education on:     Other Self-Care Comments   Blood pressure abnormalities, Importance of proper hydration, and recommendation to follow up with MD/PCP regarding Low BP.        Lumbar Exercises: Aerobic   Nustep  5 mins level 3, using UE's and LE's.  Pt stated he "disliked the seat and feeling of machine."      Knee/Hip Exercises: Standing   Knee Flexion  Strengthening;10 reps;Both;2 sets;Other (comment) Standing Marches 2 x 10 reps.    Knee Flexion Limitations  Standing Marches with single UE support on countertop, pt required cue for height of the march (hip flexion and knee flexion)  Pt required verbal and demo cues for biomechanics    Hip Flexion  Stengthening;10 reps;Both;Knee straight    Hip Flexion Limitations  Pt cued to keep trunk from and hip compensating when AROM of hip flexion was too large. Pt required verbal and demo cues to correct exercise he previously had performed at home.     Hip ADduction  Strengthening;10 reps;Other (comment);Both With B UE hold on counter.    Hip ADduction Limitations  Pt required demo and vebal cues for performance of task. Cues for upright posture required.     Wall Squat  10  reps;1 set;5 seconds;Other (comment)    Wall Squat Limitations  Pt required verbal and demonstration cues to understand how to perform task. Cues for distance of lowering when performing squat, hold time, and for upright posture (back to wall at all times) required.           Balance Exercises - 06/09/17 1510  Balance Exercises: Standing   Standing Eyes Closed  Narrow base of support (BOS);Wide (BOA);3 reps;Foam/compliant surface;Head turns;Other (comment);20 secs Airex pad      Balance Exercises: Standing   Standing Eyes Closed Limitations  Standing on airex pad, near corner for wall and chair in front of patient for safety, SPTA with CGA with gait belt usage : Open stance with EC and head turns left/right and then up/down for 30 sec x 2 each. Feet Together (narrow BOS) with EC, and head turns left/right, up/down for 30 sec x 2 reps. Pt exhibited correct leaning in various directions by touching wall or chair to regain upright balance. No loss of balance requiring stepping stratergy, pt able to correct balance upright using ankle strategy.           YOUGRAM Created by Dorian Pod, SPTA Mar 12th, 2019   WALL SQUATS with hold    Leaning up against a wall or closed door on your back, slide your body downward and then return back to upright position. A door was used here because it was smoother and had less friction than the wall. Knees should bend in line with the 2nd toe and not pass the front of the foot. Repeat 2 Times Hold 3 Seconds Complete 2 Sets Perform 10 Time(s) a Day  Standing marches   Standing at a counter, or use the back of a chair, one finger for balance. Lift one knee up, lower and slowly repeat with other leg (alternating sides). Powered by HEP[employer] Created By Dorian Pod, SPTA Mar 12th, 2019 - Page 1 of 2 Repeat 2 Times Hold 3 Seconds Complete 2 Sets Perform 10 Time(s) a Day  Standing Hip Abduction  Standing tall, lift one leg out to the  side then return. Repeat 1 Time Hold 3 Seconds Complete 2 Sets Perform 10 Time(s) a Day  HIP FLEXION - STANDING - SLR   While standing on one leg, lift your other leg forward with a straight knee as shown. Return to starting position and repeat. Use your arms for support if needed for balance and safety.   PT Education - 06/09/17 1515    Education provided  Yes    Education Details  Adding to current HEP: wall squats, standing marches, hip abduction, and hip flexion. ; BP education, hydration importance, and recommended follow up with MD for Low BP. PTA to sent note to patients MD and primary PT regarding low BP per patients request.     Person(s) Educated  Patient    Methods  Explanation;Demonstration    Comprehension  Verbalized understanding;Returned demonstration            PT Short Term Goals - 05/25/17 1305      PT SHORT TERM GOAL #1   Title  Pt will initiate HEP in order to indicate decreased fall risk and improved functional mobility.  (Target Date: 06/24/17)    Time  4    Period  Weeks    Status  New    Target Date  06/24/17      PT SHORT TERM GOAL #2   Title  Pt will improve FGA to 25/30 in order to indicate decreased fall risk.      Time  4    Period  Weeks    Status  New      PT SHORT TERM GOAL #3   Title  Pt will ambulate with gait speed >/=3.58 ft/sec in order to indicate more normal gait speed.  Time  4    Period  Weeks    Status  New      PT SHORT TERM GOAL #4   Title  Pt will ambulate up to 500' at independent level with improved posture and decreased shuffled gait in order to indicate decreased fall risk.      Time  4    Period  Weeks    Status  New      PT SHORT TERM GOAL #5   Title  Will assess SOT and write goals as indicated.         PT Long Term Goals - 05/25/17 1314      PT LONG TERM GOAL #1   Title  Pt will be independent with HEP in order to indicate decreased fall risk and improved functional mobility.  (Target Date:  07/24/17)    Time  8    Period  Weeks    Status  New      PT LONG TERM GOAL #2   Title  Pt will improve FGA to 28/30 in order to indicate decreased fall risk.      Time  8    Period  Weeks    Status  New      PT LONG TERM GOAL #3   Title  Pt will ambulate >1000' independently over varying outdoor surfaces (including curb step and grass) in order to indicate return to community and leisure activity.     Time  8    Period  Weeks    Status  New      PT LONG TERM GOAL #4   Title  Will add SOT goal as it is assessed    Time  8    Period  Weeks    Status  New            Plan - 06/09/17 1518    Clinical Impression Statement  Pt arrived to PT session today with low blood pressure reading: 92/48 , 100/51. After 5 minutes of performing Nustep, pt BP was 98/55. Pt was educated on BP abnormalities, importance of hydration, and was recommended to follow up with his MD/PCP regarding recent low BP. Continued today's skilled PT session due to patient being asymptomatic for low BP. Focused on progression of patients home exercise program to focus on bilateral lower extremity strengthening. Also, static balance while standing on an uneven surface was progressed today. Pt also remained asympomatic of low BP symptoms during and after PT treatment. Pt is making progress towards STG's by demonstrating compliance to HEP. Pt would also benefit from skilled PT interventions towards reaching unmet PT goals.     History and Personal Factors relevant to plan of care:  see above    Clinical Presentation  Evolving    Clinical Presentation due to:  see above    Clinical Decision Making  Moderate    Clinical Impairments Affecting Rehab Potential  co-morbidities    PT Frequency  2x / week    PT Duration  8 weeks    PT Treatment/Interventions  ADLs/Self Care Home Management;DME Instruction;Gait training;Stair training;Functional mobility training;Therapeutic activities;Therapeutic exercise;Balance  training;Neuromuscular re-education;Patient/family education;Orthotic Fit/Training;Passive range of motion;Vestibular    PT Next Visit Plan  Check BP ( Low BP last session). Follow up on patient's plan to make an appointment with MD/PCP regarding recent low BP. Check on HEP at home given in today's session. Also plan to progress static and balance exercises. Progress BLE strengthening exercises.  Consulted and Agree with Plan of Care  Patient       Patient will benefit from skilled therapeutic intervention in order to improve the following deficits and impairments:  Decreased balance, Decreased mobility, Decreased range of motion, Decreased strength, Abnormal gait, Impaired flexibility, Impaired perceived functional ability, Impaired sensation, Improper body mechanics, Postural dysfunction  Visit Diagnosis: Unsteadiness on feet  Muscle weakness (generalized)  Abnormal posture  Other abnormalities of gait and mobility     Problem List Patient Active Problem List   Diagnosis Date Noted  . PSVT (paroxysmal supraventricular tachycardia) (HCC) 09/12/2015  . Chest pain 05/03/2015  . Dyspnea 05/03/2015  . ALLERGIC RHINITIS 09/21/2007  . G E R D 09/21/2007  . SMOKE INHALATION 09/21/2007  . OSTEOPOROSIS 01/28/2007  . PALPITATIONS 01/28/2007  . COUGH 01/28/2007  . ALLERGY 01/28/2007    Dorian Pod 06/10/2017, 9:20 AM  [facility] [address] [place], [place], [address] Phone: [phone]   Fax:  [phone]  Name: CHETT TANIGUCHI MRN: [id number] Date of Birth: 1940/07/10

## 2017-06-09 NOTE — Telephone Encounter (Signed)
Called and spoke to pt.  He is asking for a speech therapy (has delay when trying to get words out, and but also accessing words.  (he was in for PT whe he saw attorney friend and he was getting ST for similar issues he was having and thought he would benefit from this.   He also  Noted he is having Decreased Bp in the mid 40's (diastolic).  Just a FYI, he will be contacting his pcp.

## 2017-06-09 NOTE — Telephone Encounter (Signed)
Pt would like to know if he can be referred to speech therapy. Pt is having trouble accessing words. Please call home [phone] or office # [phone] Pt is on break this week home# is best to try first.

## 2017-06-09 NOTE — Patient Instructions (Addendum)
YOUGRAM Created by Anshu Wehner GarcDorian Podia, SPTA Mar 12th, 2019   WALL SQUATS with hold    Leaning up against a wall or closed door on your back, slide your body downward and then return back to upright position. A door was used here because it was smoother and had less friction than the wall. Knees should bend in line with the 2nd toe and not pass the front of the foot. Repeat 2 Times Hold 3 Seconds Complete 2 Sets Perform 10 Time(s) a Day  Standing marches   Standing at a counter, or use the back of a chair, one finger for balance. Lift one knee up, lower and slowly repeat with other leg (alternating sides). Powered by [employer] Created By Dorian PodKevin Mariyanna Mucha, SPTA Mar 12th, 2019 - Page 1 of 2 Repeat 2 Times Hold 3 Seconds Complete 2 Sets Perform 10 Time(s) a Day  Standing Hip Abduction  Standing tall, lift one leg out to the side then return. Repeat 1 Time Hold 3 Seconds Complete 2 Sets Perform 10 Time(s) a Day  HIP FLEXION - STANDING - SLR   While standing on one leg, lift your other leg forward with a straight knee as shown. Return to starting position and repeat. Use your arms for support if needed for balance and safety.

## 2017-06-10 DIAGNOSIS — R42 Dizziness and giddiness: Secondary | ICD-10-CM | POA: Diagnosis not present

## 2017-06-10 DIAGNOSIS — I471 Supraventricular tachycardia: Secondary | ICD-10-CM | POA: Diagnosis not present

## 2017-06-10 DIAGNOSIS — R634 Abnormal weight loss: Secondary | ICD-10-CM | POA: Diagnosis not present

## 2017-06-10 DIAGNOSIS — I959 Hypotension, unspecified: Secondary | ICD-10-CM | POA: Diagnosis not present

## 2017-06-10 NOTE — Telephone Encounter (Signed)
Speech therapy ordered. -VRP

## 2017-06-10 NOTE — Addendum Note (Signed)
Addended by: Joycelyn SchmidPENUMALLI, VIKRAM R on: 06/10/2017 05:38 PM   Modules accepted: Orders

## 2017-06-12 ENCOUNTER — Ambulatory Visit: Payer: BLUE CROSS/BLUE SHIELD | Admitting: Rehabilitation

## 2017-06-12 ENCOUNTER — Encounter: Payer: Self-pay | Admitting: Rehabilitation

## 2017-06-12 DIAGNOSIS — R41841 Cognitive communication deficit: Secondary | ICD-10-CM | POA: Diagnosis not present

## 2017-06-12 DIAGNOSIS — R293 Abnormal posture: Secondary | ICD-10-CM | POA: Diagnosis not present

## 2017-06-12 DIAGNOSIS — R2689 Other abnormalities of gait and mobility: Secondary | ICD-10-CM | POA: Diagnosis not present

## 2017-06-12 DIAGNOSIS — M6281 Muscle weakness (generalized): Secondary | ICD-10-CM

## 2017-06-12 DIAGNOSIS — R2681 Unsteadiness on feet: Secondary | ICD-10-CM

## 2017-06-12 NOTE — Patient Instructions (Signed)
Thoracic Self-Mobilization (Supine)    With rolled towel placed lengthwise at lower ribs level, lie back on towel with arms outstretched. Hold __60-120__ seconds. Relax.  You can start without towel roll and work your way up to the towel.  Start with arms in a "T" position and work up to more of a "Y" position.  Do 2 times per day.    Tip Card 1.The goal of habituation training is to assist in decreasing symptoms of vertigo, dizziness, or nausea provoked by specific head and body motions. 2.These exercises may initially increase symptoms; however, be persistent and work through symptoms. With repetition and time, the exercises will assist in reducing or eliminating symptoms. 3.Exercises should be stopped and discussed with the therapist if you experience any of the following: - Sudden change or fluctuation in hearing - New onset of ringing in the ears, or increase in current intensity - Any fluid discharge from the ear - Severe pain in neck or back - Extreme nausea  Copyright  VHI. All rights reserved.  Rolling   With pillow under head, start on back. Roll to your right side.  Hold until dizziness stops, plus 20 seconds and then roll to the left side.  Hold until dizziness stops, plus 20 seconds.  Repeat sequence 5 times per session. Do 2 sessions per day.

## 2017-06-12 NOTE — Therapy (Signed)
[facility] [address] [place], [place], [address] Phone: [phone]   Fax:  [phone]  Physical Therapy Treatment  Patient Details  Name: Lee Nelson MRN: [id number] Date of Birth: 13-Sep-1940 Referring Provider: Joycelyn Schmid, MD   Encounter Date: 06/12/2017  PT End of Session - 06/12/17 1348    Visit Number  4    Number of Visits  17    Date for PT Re-Evaluation  07/24/17    Authorization Type  BCBS    PT Start Time  1018    PT Stop Time  1100    PT Time Calculation (min)  42 min    Equipment Utilized During Treatment  Gait belt    Activity Tolerance  Patient tolerated treatment well    Behavior During Therapy  WFL for tasks assessed/performed       Past Medical History:  Diagnosis Date  . Anxiety   . Arthritis   . Bladder calculus   . BPH (benign prostatic hyperplasia)   . Chronic constipation   . Complication of anesthesia    " I had some coughing afterwards for a couple of days"--  per pt "perfers spinal anesthesia since general anesthesia congnitive issues when older"  . Diverticulosis of colon   . Dry eye syndrome of both eyes   . Environmental allergies   . GERD (gastroesophageal reflux disease)    occasional  . History of adenomatous polyp of colon    08/ 2004  . History of kidney stones   . History of squamous cell carcinoma in situ (SCCIS) of skin    s/p  excision 2013 facial areas and 06/ 2016 nose  . Migraine    eye migraine occasional  . Seasonal and perennial allergic rhinitis   . Thrombocytopenia (HCC)   . Tingling    hands and feet bilat , intermittantly-- per pt has lumbar bulging disk  . Urinary frequency   . Vocal fold atrophy    dysphonia-- per pt has to drink large amount of water to take even on pill  . Wears glasses     Past Surgical History:  Procedure Laterality Date  . APPENDECTOMY  child  . CARDIOVASCULAR STRESS TEST  05-17-2015  dr hilty   Low risk  nuclear study w/ no ischemia/  normal LV function and wall motion , stress ef 54% (lvef 45-54%)  . CHOLECYSTECTOMY N/A 09/29/2014   Procedure: LAPAROSCOPIC CHOLECYSTECTOMY WITH INTRAOPERATIVE CHOLANGIOGRAM;  Surgeon: Ovidio Kin, MD;  Location: [facility];  Service: General;  Laterality: N/A;  . COLONOSCOPY  last one 09-06-2010  . ESOPHAGOGASTRODUODENOSCOPY N/A 12/09/2013   Procedure: ESOPHAGOGASTRODUODENOSCOPY (EGD);  Surgeon: Willis Modena, MD;  Location: [facility] ENDOSCOPY;  Service: Endoscopy;  Laterality: N/A;  . EXTRACORPOREAL SHOCK WAVE LITHOTRIPSY  yrs ago  . INGUINAL HERNIA REPAIR Left child  . NISSEN FUNDOPLICATION  1980's   open  . STONE EXTRACTION WITH BASKET N/A 08/18/2016   Procedure: STONE EXTRACTION WITH BASKET;  Surgeon: Jethro Bolus, MD;  [facility];  Service: Urology;  Laterality: N/A;  . THULIUM LASER TURP (TRANSURETHRAL RESECTION OF PROSTATE) N/A 08/18/2016   Procedure: THULIUM LASER BLADDER NECK INCISION AND BLADDER STONE REMOVAL;  Surgeon: Jethro Bolus, MD;  Location: [facility];  Service: Urology;  Laterality: N/A;  . TONSILLECTOMY  child  . TRANSTHORACIC ECHOCARDIOGRAM  11/18/2010   grade 1 diastolic dysfunction, ef 55-60%/  trivial MR and TR/ mild dilated RA    There were no  vitals filed for this visit.  Subjective Assessment - 06/12/17 1019    Subjective  Reports no changes since last session, no falls.     Pertinent History  memory loss, osteoperosis, degenerative disc disease in cervical spine    Limitations  House hold activities;Walking    Patient Stated Goals  "To get rid of the hip pain and figure out whats going on with my balance."     Currently in Pain?  Yes    Pain Score  3     Pain Location  Back    Pain Orientation  Mid;Lower    Pain Descriptors / Indicators  Aching    Pain Radiating Towards  mid to lower back     Pain Onset  More than a month ago    Pain Frequency  Intermittent    Aggravating Factors    sitting long periods, nustep at heavy resistance    Pain Relieving Factors  Tylenol, stretch    Multiple Pain Sites  Yes    Pain Score  3    Pain Location  Hip    Pain Orientation  Right    Pain Descriptors / Indicators  Aching;Sharp    Pain Onset  More than a month ago    Pain Frequency  Intermittent    Aggravating Factors   unsure    Pain Relieving Factors  Tylenol, sitting with back support.                       [facility] Adult PT Treatment/Exercise - 06/12/17 0001      Self-Care   Self-Care  Other Self-Care Comments    Other Self-Care Comments   Continue to educate on water intake related to BP issues, but BP somewhat better during today's session. Pt with some questions regarding exercises and frequency.  Pt reporting that he does not have time to perform them 10 x per day.  PT reviewed handout and there seemed to be an error in frequency.  Educated that exercise should be performed x 10 reps and up to 2 times daily.  Pt reports that he can complete this.        Therapeutic Activites    Therapeutic Activities  Other Therapeutic Activities    Other Therapeutic Activities  Pt reports dizziness with rolling in bed.  Performed roll test to assess for positional vertigo.  Did not note any nystagmus but he did have increased dizziness when rolling to the L.  Provided habiutuation rolling for HEP.        Neuro Re-ed    Neuro Re-ed Details   Had pt demonstrate corner balance tasks during session.  He was able to perform them safely with mild to moderate postural sway.  Did provide cues on importance of using regular pillow vs memory foam as he states he does sink down into pillow.  Also educated on ensuring his back is facing corner and he has chair in front of him for support.  Pt verbalized understanding.  While standing beside counter top had pt work on improved step length while elevating UE into flex to promote improved posture and stride length with gait.  Performed forward  stepping on each side x 10 reps with some pain on R hip following task.  Also performed lateral stepping while holding arm in abd x 10 reps on each side (PWR move). Provided cues for increased trunk extension.  Note increased tightness in ant chest and hips, see below for  therex to address.        Exercises   Exercises  Other Exercises    Other Exercises   Performed supine hip flex stretch off edge of mat x 1 min on each side with cues to increase knee flex as able (he reports doing some version of this at home already-education to continue), supine ant chest stretch with towel roll at spine and arms in "T" position.  Pt unable to tolerate elevating arms at this time due to increased tightness.  Had pt hold x 2 mins and also performed without towel roll.  He did feel stretch without towel and was able to do elevate arms more, therefore encouraged him to start here and work towards having towel for increased stretch.  Pt verbalized understanding-added to HEP.               PT Education - 06/12/17 1348    Education provided  Yes    Education Details  see self care    Person(s) Educated  Patient    Methods  Explanation    Comprehension  Verbalized understanding       PT Short Term Goals - 05/25/17 1305      PT SHORT TERM GOAL #1   Title  Pt will initiate HEP in order to indicate decreased fall risk and improved functional mobility.  (Target Date: 06/24/17)    Time  4    Period  Weeks    Status  New    Target Date  06/24/17      PT SHORT TERM GOAL #2   Title  Pt will improve FGA to 25/30 in order to indicate decreased fall risk.      Time  4    Period  Weeks    Status  New      PT SHORT TERM GOAL #3   Title  Pt will ambulate with gait speed >/=3.58 ft/sec in order to indicate more normal gait speed.      Time  4    Period  Weeks    Status  New      PT SHORT TERM GOAL #4   Title  Pt will ambulate up to 500' at independent level with improved posture and decreased shuffled gait  in order to indicate decreased fall risk.      Time  4    Period  Weeks    Status  New      PT SHORT TERM GOAL #5   Title  Will assess SOT and write goals as indicated.         PT Long Term Goals - 05/25/17 1314      PT LONG TERM GOAL #1   Title  Pt will be independent with HEP in order to indicate decreased fall risk and improved functional mobility.  (Target Date: 07/24/17)    Time  8    Period  Weeks    Status  New      PT LONG TERM GOAL #2   Title  Pt will improve FGA to 28/30 in order to indicate decreased fall risk.      Time  8    Period  Weeks    Status  New      PT LONG TERM GOAL #3   Title  Pt will ambulate >1000' independently over varying outdoor surfaces (including curb step and grass) in order to indicate return to community and leisure activity.     Time  8  Period  Weeks    Status  New      PT LONG TERM GOAL #4   Title  Will add SOT goal as it is assessed    Time  8    Period  Weeks    Status  New            Plan - 06/12/17 1348    Clinical Impression Statement  Skilled session continued to review HEP as requested by pt, NMR exercises to carryover to improved posture and stride length with gait and stretching exercises to improve posture.  He continued to have low BP during session but was asymptomatic with BP 97/53.  continue to educate on water intake.  He states he did speak with MD/RN and reports to keep above 42 DBP.      Clinical Impairments Affecting Rehab Potential  co-morbidities    PT Frequency  2x / week    PT Duration  8 weeks    PT Treatment/Interventions  ADLs/Self Care Home Management;DME Instruction;Gait training;Stair training;Functional mobility training;Therapeutic activities;Therapeutic exercise;Balance training;Neuromuscular re-education;Patient/family education;Orthotic Fit/Training;Passive range of motion;Vestibular    PT Next Visit Plan  Check BP ( Low BP last session). DBP should remain above 42 (per pt report)  Check on HEP  at home given in today's session. Also plan to progress static and balance exercises. Progress BLE strengthening exercises.  postural exercises and improved gait quality with stride length, posture and heel to toe contact.     Consulted and Agree with Plan of Care  Patient       Patient will benefit from skilled therapeutic intervention in order to improve the following deficits and impairments:  Decreased balance, Decreased mobility, Decreased range of motion, Decreased strength, Abnormal gait, Impaired flexibility, Impaired perceived functional ability, Impaired sensation, Improper body mechanics, Postural dysfunction  Visit Diagnosis: Unsteadiness on feet  Muscle weakness (generalized)  Abnormal posture  Other abnormalities of gait and mobility     Problem List Patient Active Problem List   Diagnosis Date Noted  . PSVT (paroxysmal supraventricular tachycardia) (HCC) 09/12/2015  . Chest pain 05/03/2015  . Dyspnea 05/03/2015  . ALLERGIC RHINITIS 09/21/2007  . G E R D 09/21/2007  . SMOKE INHALATION 09/21/2007  . OSTEOPOROSIS 01/28/2007  . PALPITATIONS 01/28/2007  . COUGH 01/28/2007  . ALLERGY 01/28/2007    Harriet ButteEmily Anthonia Monger, PT, MPT [facility] [address] [place], [place], [address] Phone: [phone]   Fax:  [phone] 06/12/17, 1:54 PM  Name: Adolm JosephMichael K Jaquay MRN: [id number] Date of Birth: [birth year]

## 2017-06-15 ENCOUNTER — Encounter: Payer: Self-pay | Admitting: Physical Therapy

## 2017-06-15 ENCOUNTER — Ambulatory Visit: Payer: BLUE CROSS/BLUE SHIELD | Admitting: Physical Therapy

## 2017-06-15 VITALS — BP 98/55

## 2017-06-15 DIAGNOSIS — R41841 Cognitive communication deficit: Secondary | ICD-10-CM | POA: Diagnosis not present

## 2017-06-15 DIAGNOSIS — R293 Abnormal posture: Secondary | ICD-10-CM | POA: Diagnosis not present

## 2017-06-15 DIAGNOSIS — M6281 Muscle weakness (generalized): Secondary | ICD-10-CM | POA: Diagnosis not present

## 2017-06-15 DIAGNOSIS — R2681 Unsteadiness on feet: Secondary | ICD-10-CM | POA: Diagnosis not present

## 2017-06-15 DIAGNOSIS — R2689 Other abnormalities of gait and mobility: Secondary | ICD-10-CM | POA: Diagnosis not present

## 2017-06-15 NOTE — Therapy (Signed)
[facility] [address] [place], [place], [address] Phone: [phone]   Fax:  [phone]  Physical Therapy Treatment  Patient Details  Name: Lee Nelson MRN: [id number] Date of Birth: [birth year] Referring Provider: Andrey Spearman, MD   Encounter Date: 06/15/2017  PT End of Session - 06/15/17 1143    Visit Number  5    Number of Visits  17    Date for PT Re-Evaluation  07/24/17    Authorization Type  BCBS    PT Start Time  0930    PT Stop Time  1017    PT Time Calculation (min)  47 min    Equipment Utilized During Treatment  Gait belt    Activity Tolerance  Patient tolerated treatment well    Behavior During Therapy  [facility] for tasks assessed/performed       Past Medical History:  Diagnosis Date  . Anxiety   . Arthritis   . Bladder calculus   . BPH (benign prostatic hyperplasia)   . Chronic constipation   . Complication of anesthesia    " I had some coughing afterwards for a couple of days"--  per pt "perfers spinal anesthesia since general anesthesia congnitive issues when older"  . Diverticulosis of colon   . Dry eye syndrome of both eyes   . Environmental allergies   . GERD (gastroesophageal reflux disease)    occasional  . History of adenomatous polyp of colon    08/ 2004  . History of kidney stones   . History of squamous cell carcinoma in situ (SCCIS) of skin    s/p  excision 2013 facial areas and 06/ 2016 nose  . Migraine    eye migraine occasional  . Seasonal and perennial allergic rhinitis   . Thrombocytopenia ([place])   . Tingling    hands and feet bilat , intermittantly-- per pt has lumbar bulging disk  . Urinary frequency   . Vocal fold atrophy    dysphonia-- per pt has to drink large amount of water to take even on pill  . Wears glasses     Past Surgical History:  Procedure Laterality Date  . APPENDECTOMY  child  . CARDIOVASCULAR STRESS TEST  05-17-2015  dr hilty   Low risk  nuclear study w/ no ischemia/  normal LV function and wall motion , stress ef 54% (lvef 45-54%)  . CHOLECYSTECTOMY N/A 09/29/2014   Procedure: LAPAROSCOPIC CHOLECYSTECTOMY WITH INTRAOPERATIVE CHOLANGIOGRAM;  Surgeon: Alphonsa Overall, MD;  Location: [facility];  Service: General;  Laterality: N/A;  . COLONOSCOPY  last one 09-06-2010  . ESOPHAGOGASTRODUODENOSCOPY N/A 12/09/2013   Procedure: ESOPHAGOGASTRODUODENOSCOPY (EGD);  Surgeon: Arta Silence, MD;  Location: [facility];  Service: Endoscopy;  Laterality: N/A;  . EXTRACORPOREAL SHOCK WAVE LITHOTRIPSY  yrs ago  . INGUINAL HERNIA REPAIR Left child  . NISSEN FUNDOPLICATION  8182'X   open  . STONE EXTRACTION WITH BASKET N/A 08/18/2016   Procedure: STONE EXTRACTION WITH BASKET;  Surgeon: Carolan Clines, MD;  Location: [facility];  Service: Urology;  Laterality: N/A;  . THULIUM LASER TURP (TRANSURETHRAL RESECTION OF PROSTATE) N/A 08/18/2016   Procedure: THULIUM LASER BLADDER NECK INCISION AND BLADDER STONE REMOVAL;  Surgeon: Carolan Clines, MD;  Location: [facility];  Service: Urology;  Laterality: N/A;  . TONSILLECTOMY  child  . TRANSTHORACIC ECHOCARDIOGRAM  11/18/2010   grade 1 diastolic dysfunction, ef 93-71%/  trivial MR and TR/ mild dilated RA    Vitals:  06/15/17 0933  BP: (!) 98/55  Taken in a seated position, manually. Pt asymptomatic during entire PT session.   Subjective Assessment - 06/15/17 0933    Subjective  Pt reports no falls, and states medications are the same. "But I am taking aloe (over the counter) for digestive health." Pt rated pain as low today but still achy in my lower back and right hip.     Pertinent History  memory loss, osteoperosis, degenerative disc disease in cervical spine    Limitations  House hold activities;Walking    Patient Stated Goals  "To get rid of the hip pain and figure out whats going on with my balance."     Currently in Pain?  Yes    Pain Score  3     Pain  Location  Back    Pain Orientation  Mid    Pain Type  Chronic pain    Pain Radiating Towards  mid to lower back    Pain Onset  More than a month ago    Pain Frequency  Intermittent    Aggravating Factors   sitting long periods    Pain Relieving Factors  tylenol, stretching    Multiple Pain Sites  Yes    Pain Score  3    Pain Location  Hip    Pain Orientation  Right    Pain Descriptors / Indicators  Aching    Pain Onset  More than a month ago    Pain Frequency  Intermittent    Aggravating Factors   When I put more weight on it then the other    Pain Relieving Factors  Tylenol, sitting with back support       [facility] Adult PT Treatment/Exercise - 06/15/17 1241      Transfers   Transfers  Sit to Stand;Stand to Sit    Sit to Stand  7: Independent    Stand to Sit  7: Independent      Ambulation/Gait   Ambulation/Gait  Yes    Ambulation/Gait Assistance 5: Supervision;4: Min guard - when fatigued or balance callenged   Ambulation Distance (Feet)  50 Feet x 5 reps during session.    Assistive device  None    Gait Pattern  Step-through pattern;Decreased stride length;Right flexed knee in stance;Left flexed knee in stance;Shuffle;Trunk flexed    Ambulation Surface  Level;Indoor      High Level Balance   High Level Balance Activities  Side stepping;Backward walking;Marching forwards;Marching backwards;Other (comment)    High Level Balance Comments  Pt performed x 3 reps of each task alongside countertop for single UE support to occasional UE support as needed with SPTA holding gait belt with supervision to min assistance; Minimal assistance during high marching and tandem walking backwards provided. The rest of the exercises SPTA provided supervision assistance. Pt required initial verbal, demonstration cues for performance of task; marching required tactile cue for march height and 3 sec hold; Marching forward and backwards, "Tai chi walking forward" High marching forward with slow motion  with a 3 second hold of the march and large step length emphasis, tandem walking forward and backward, side stepping left and right. Pt able to perform all task with pt only asking for occasional feedback to affirm he was performing each task correctly. Pt was able to perform each ask without any loss of balance episode, pt only had some minor leaning laterally but was able to correct it by using occasional single UE assist, pt let go again  when he felt comfortable of single UE assistance.           Balance Exercises - 06/15/17 1253      Balance Exercises: Standing   Standing Eyes Closed  Narrow base of support (BOS);Wide (BOA);3 reps;Foam/compliant surface;Head turns;Other (comment);20 secs    Rockerboard  Anterior/posterior;Lateral;EC;EO;30 seconds;Other (comment);Intermittent UE support;10 reps      Balance Exercises: Standing   Standing Eyes Closed Limitations  Standing on airex pad, near corner for wall and chair in front of patient for safety, SPTA with min assistance with gait belt usage : Open stance with EC and head turns left/right and then up/down for 30 sec x 2 each. Feet Together (narrow BOS) with EC, and head turns left/right, up/down for 30 sec x 2 reps. Pt exhibited correct leaning in various directions by touching wall or chair to regain upright balance. No loss of balance requiring stepping stratergy, pt able to correct balance upright using ankle strategy.  Added tandem stance with eyes open to begin x 1 reps for 30 sec each; progressed to tandem stance with EC x 1 rep x 30 sec each with two finger UE hold on chair.     Rebounder Limitations  Using small rockerboard with occasional UE support on back of chair positioned in front of pt for safety; SPTA with min assistance. Anterior/ posterior x 10 reps with 5 sec holds; lateral weight shifting x 10 reps with 5 sec holds each, followed by open stance holding balance x 30 sec reps with EC. Pt had lateral leaning with EC and open stance  during attempting to regain his upright balance. Pt required verbal and demo cues for performance of task.           PT Short Term Goals - 05/25/17 1305      PT SHORT TERM GOAL #1   Title  Pt will initiate HEP in order to indicate decreased fall risk and improved functional mobility.  (Target Date: 06/24/17)    Time  4    Period  Weeks    Status  New    Target Date  06/24/17      PT SHORT TERM GOAL #2   Title  Pt will improve FGA to 25/30 in order to indicate decreased fall risk.      Time  4    Period  Weeks    Status  New      PT SHORT TERM GOAL #3   Title  Pt will ambulate with gait speed >/=3.58 ft/sec in order to indicate more normal gait speed.      Time  4    Period  Weeks    Status  New      PT SHORT TERM GOAL #4   Title  Pt will ambulate up to 500' at independent level with improved posture and decreased shuffled gait in order to indicate decreased fall risk.      Time  4    Period  Weeks    Status  New      PT SHORT TERM GOAL #5   Title  Will assess SOT and write goals as indicated.         PT Long Term Goals - 05/25/17 1314      PT LONG TERM GOAL #1   Title  Pt will be independent with HEP in order to indicate decreased fall risk and improved functional mobility.  (Target Date: 07/24/17)    Time  8    Period  Weeks    Status  New      PT LONG TERM GOAL #2   Title  Pt will improve FGA to 28/30 in order to indicate decreased fall risk.      Time  8    Period  Weeks    Status  New      PT LONG TERM GOAL #3   Title  Pt will ambulate >1000' independently over varying outdoor surfaces (including curb step and grass) in order to indicate return to community and leisure activity.     Time  8    Period  Weeks    Status  New      PT LONG TERM GOAL #4   Title  Will add SOT goal as it is assessed    Time  8    Period  Weeks    Status  New         Plan - 06/15/17 1235    Clinical Impression Statement  Today's skilled session focused on challenging  static balance on uneven surface using airex pad, dynamic walking in various directions for bilateral LE strengthening, and NMR exercises for improved stride length. Pt continued with low BP 98/55 but was asymptomatic. Pt tolerated most of today's session in standing positions, requiring x3 sitting rest breaks. Pt continues to make progress towards STG's and would benefit from continued PT towards unmet goals.     History and Personal Factors relevant to plan of care:  see above    Clinical Presentation  Evolving    Clinical Presentation due to:  see above    Clinical Decision Making  Moderate    Clinical Impairments Affecting Rehab Potential  co-morbidities    PT Treatment/Interventions  ADLs/Self Care Home Management;DME Instruction;Gait training;Stair training;Functional mobility training;Therapeutic activities;Therapeutic exercise;Balance training;Neuromuscular re-education;Patient/family education;Orthotic Fit/Training;Passive range of motion;Vestibular    PT Next Visit Plan  Check BP ( Low BP last session). DBP should remain above 42 (per pt report)  Check on HEP, pt stated he forgot the rolling exercise. Also plan to progress static and dynamic balance exercises. Progress BLE strengthening exercises.  postural exercises and improved gait quality with stride length, posture and heel to toe contact.     Consulted and Agree with Plan of Care  Patient       Patient will benefit from skilled therapeutic intervention in order to improve the following deficits and impairments:  Decreased balance, Decreased mobility, Decreased range of motion, Decreased strength, Abnormal gait, Impaired flexibility, Impaired perceived functional ability, Impaired sensation, Improper body mechanics, Postural dysfunction  Visit Diagnosis: Unsteadiness on feet  Muscle weakness (generalized)  Abnormal posture     Problem List Patient Active Problem List   Diagnosis Date Noted  . PSVT (paroxysmal  supraventricular tachycardia) ([place]) 09/12/2015  . Chest pain 05/03/2015  . Dyspnea 05/03/2015  . ALLERGIC RHINITIS 09/21/2007  . G E R D 09/21/2007  . SMOKE INHALATION 09/21/2007  . OSTEOPOROSIS 01/28/2007  . PALPITATIONS 01/28/2007  . COUGH 01/28/2007  . ALLERGY 01/28/2007    Carlena Sax, SPTA 06/16/2017, 9:38 AM  [facility] [address] [place], [place], [address] Phone: [phone]   Fax:  [phone]  Name: Lee Nelson MRN: [id number] Date of Birth: January 16, 1941

## 2017-06-19 ENCOUNTER — Encounter: Payer: Self-pay | Admitting: Rehabilitation

## 2017-06-19 ENCOUNTER — Ambulatory Visit: Payer: BLUE CROSS/BLUE SHIELD | Admitting: Rehabilitation

## 2017-06-19 DIAGNOSIS — R2689 Other abnormalities of gait and mobility: Secondary | ICD-10-CM

## 2017-06-19 DIAGNOSIS — R293 Abnormal posture: Secondary | ICD-10-CM | POA: Diagnosis not present

## 2017-06-19 DIAGNOSIS — M6281 Muscle weakness (generalized): Secondary | ICD-10-CM | POA: Diagnosis not present

## 2017-06-19 DIAGNOSIS — R41841 Cognitive communication deficit: Secondary | ICD-10-CM | POA: Diagnosis not present

## 2017-06-19 DIAGNOSIS — R2681 Unsteadiness on feet: Secondary | ICD-10-CM | POA: Diagnosis not present

## 2017-06-19 NOTE — Therapy (Signed)
[facility] [address] [place], [place], [address] Phone: [phone]   Fax:  [phone]  Physical Therapy Treatment  Patient Details  Name: Lee Nelson MRN: [id number] Date of Birth: [birth year] Referring Provider: Joycelyn Schmid, MD   Encounter Date: 06/19/2017  PT End of Session - 06/19/17 1247    Visit Number  6    Number of Visits  17    Date for PT Re-Evaluation  07/24/17    Authorization Type  BCBS    PT Start Time  0932    PT Stop Time  1015    PT Time Calculation (min)  43 min    Equipment Utilized During Treatment  Gait belt    Activity Tolerance  Patient tolerated treatment well    Behavior During Therapy  [facility] for tasks assessed/performed       Past Medical History:  Diagnosis Date  . Anxiety   . Arthritis   . Bladder calculus   . BPH (benign prostatic hyperplasia)   . Chronic constipation   . Complication of anesthesia    " I had some coughing afterwards for a couple of days"--  per pt "perfers spinal anesthesia since general anesthesia congnitive issues when older"  . Diverticulosis of colon   . Dry eye syndrome of both eyes   . Environmental allergies   . GERD (gastroesophageal reflux disease)    occasional  . History of adenomatous polyp of colon    08/ 2004  . History of kidney stones   . History of squamous cell carcinoma in situ (SCCIS) of skin    s/p  excision 2013 facial areas and 06/ 2016 nose  . Migraine    eye migraine occasional  . Seasonal and perennial allergic rhinitis   . Thrombocytopenia (HCC)   . Tingling    hands and feet bilat , intermittantly-- per pt has lumbar bulging disk  . Urinary frequency   . Vocal fold atrophy    dysphonia-- per pt has to drink large amount of water to take even on pill  . Wears glasses     Past Surgical History:  Procedure Laterality Date  . APPENDECTOMY  child  . CARDIOVASCULAR STRESS TEST  05-17-2015  dr hilty   Low risk  nuclear study w/ no ischemia/  normal LV function and wall motion , stress ef 54% (lvef 45-54%)  . CHOLECYSTECTOMY N/A 09/29/2014   Procedure: LAPAROSCOPIC CHOLECYSTECTOMY WITH INTRAOPERATIVE CHOLANGIOGRAM;  Surgeon: Ovidio Kin, MD;  Location: [facility];  Service: General;  Laterality: N/A;  . COLONOSCOPY  last one 09-06-2010  . ESOPHAGOGASTRODUODENOSCOPY N/A 12/09/2013   Procedure: ESOPHAGOGASTRODUODENOSCOPY (EGD);  Surgeon: Willis Modena, MD;  Location: [facility] ENDOSCOPY;  Service: Endoscopy;  Laterality: N/A;  . EXTRACORPOREAL SHOCK WAVE LITHOTRIPSY  yrs ago  . INGUINAL HERNIA REPAIR Left child  . NISSEN FUNDOPLICATION  1980's   open  . STONE EXTRACTION WITH BASKET N/A 08/18/2016   Procedure: STONE EXTRACTION WITH BASKET;  Surgeon: Jethro Bolus, MD;  [facility];  Service: Urology;  Laterality: N/A;  . THULIUM LASER TURP (TRANSURETHRAL RESECTION OF PROSTATE) N/A 08/18/2016   Procedure: THULIUM LASER BLADDER NECK INCISION AND BLADDER STONE REMOVAL;  Surgeon: Jethro Bolus, MD;  Location: [facility];  Service: Urology;  Laterality: N/A;  . TONSILLECTOMY  child  . TRANSTHORACIC ECHOCARDIOGRAM  11/18/2010   grade 1 diastolic dysfunction, ef 55-60%/  trivial MR and TR/ mild dilated RA    There were no  vitals filed for this visit.  Subjective Assessment - 06/19/17 0934    Subjective  Pt reports not sleeping well last night, has some mild R low back and hip pain today.     Pertinent History  memory loss, osteoperosis, degenerative disc disease in cervical spine    Limitations  House hold activities;Walking    Patient Stated Goals  "To get rid of the hip pain and figure out whats going on with my balance."     Currently in Pain?  Yes    Pain Score  2     Pain Location  Back    Pain Orientation  Right    Pain Descriptors / Indicators  Aching    Pain Type  Chronic pain    Pain Radiating Towards  low back to hip and also in R knee    Pain Onset   More than a month ago    Pain Frequency  Intermittent    Aggravating Factors   sitting long periods    Pain Relieving Factors  tylenol, stretching                No data recorded       [facility] Adult PT Treatment/Exercise - 06/19/17 0001      Ambulation/Gait   Ambulation/Gait  Yes    Ambulation/Gait Assistance  5: Supervision    Ambulation/Gait Assistance Details  In between tasks, had pt work on ambulation to/from areas of gym with improved posture, arm swing and stride length to carryover from exercises performed within session.     Ambulation Distance (Feet)  150 Feet total    Assistive device  None    Gait Pattern  Step-through pattern;Decreased stride length;Right flexed knee in stance;Left flexed knee in stance;Shuffle;Trunk flexed    Ambulation Surface  Level;Indoor    Gait Comments  In // bars worked on forward and lateral stepping over two balance beams with emphasis on posture and increased hip/knee flex as well as ankle DF to carry over to gait.  Pt had increased difficulty with forward stepping vs lateral stepping.       Neuro Re-ed    Neuro Re-ed Details   --      Exercises   Exercises  Other Exercises    Other Exercises   Pt reports he is performing back extension exercise using door frame at home.  PT had pt demonstrate this and note that he tends to perform neck extension rather than chin tuck.  Provided tactile and verbal cues to correct and provided picture for improved carrryover.  Performed seated chin tuck x 10 reps with cues for technique as well as self thoracic mob/ant chest stretch seated in chair extending over chair back x 45 secs, B hip flex stretch off edge of mat x 2 mins each (with knee to chest on opposite side Maisie Fus(Thomas test position), supine towel stretch for ant chest x 2 mins in "T" position.  Note increased tightness on R side vs L.          PWR [facility]) - 06/19/17 1001    PWR! Up  x 10 reps from sitting position    PWR! Twist  x 10 reps  with // bars in front in case of support needs          PT Education - 06/19/17 1246    Education provided  Yes    Education Details  chin tuck with door way exercise.     Person(s) Educated  Patient  Methods  Explanation    Comprehension  Verbalized understanding       PT Short Term Goals - 05/25/17 1305      PT SHORT TERM GOAL #1   Title  Pt will initiate HEP in order to indicate decreased fall risk and improved functional mobility.  (Target Date: 06/24/17)    Time  4    Period  Weeks    Status  New    Target Date  06/24/17      PT SHORT TERM GOAL #2   Title  Pt will improve FGA to 25/30 in order to indicate decreased fall risk.      Time  4    Period  Weeks    Status  New      PT SHORT TERM GOAL #3   Title  Pt will ambulate with gait speed >/=3.58 ft/sec in order to indicate more normal gait speed.      Time  4    Period  Weeks    Status  New      PT SHORT TERM GOAL #4   Title  Pt will ambulate up to 500' at independent level with improved posture and decreased shuffled gait in order to indicate decreased fall risk.      Time  4    Period  Weeks    Status  New      PT SHORT TERM GOAL #5   Title  Will assess SOT and write goals as indicated.         PT Long Term Goals - 05/25/17 1314      PT LONG TERM GOAL #1   Title  Pt will be independent with HEP in order to indicate decreased fall risk and improved functional mobility.  (Target Date: 07/24/17)    Time  8    Period  Weeks    Status  New      PT LONG TERM GOAL #2   Title  Pt will improve FGA to 28/30 in order to indicate decreased fall risk.      Time  8    Period  Weeks    Status  New      PT LONG TERM GOAL #3   Title  Pt will ambulate >1000' independently over varying outdoor surfaces (including curb step and grass) in order to indicate return to community and leisure activity.     Time  8    Period  Weeks    Status  New      PT LONG TERM GOAL #4   Title  Will add SOT goal as it is assessed     Time  8    Period  Weeks    Status  New            Plan - 06/19/17 1247    Clinical Impression Statement  Skilled session continues to focus on postural re-education exercises and NMR exercises for improved step and stride length.  Pt able to carryover to gait fairly well with cues during session.      Clinical Impairments Affecting Rehab Potential  co-morbidities    PT Treatment/Interventions  ADLs/Self Care Home Management;DME Instruction;Gait training;Stair training;Functional mobility training;Therapeutic activities;Therapeutic exercise;Balance training;Neuromuscular re-education;Patient/family education;Orthotic Fit/Training;Passive range of motion;Vestibular    PT Next Visit Plan  Check BP ( Low BP last session). DBP should remain above 42 (per pt report)  Also plan to progress static and dynamic balance exercises. Progress BLE strengthening exercises.  postural exercises and  improved gait quality with stride length, posture and heel to toe contact.     Consulted and Agree with Plan of Care  Patient       Patient will benefit from skilled therapeutic intervention in order to improve the following deficits and impairments:  Decreased balance, Decreased mobility, Decreased range of motion, Decreased strength, Abnormal gait, Impaired flexibility, Impaired perceived functional ability, Impaired sensation, Improper body mechanics, Postural dysfunction  Visit Diagnosis: Unsteadiness on feet  Muscle weakness (generalized)  Abnormal posture  Other abnormalities of gait and mobility     Problem List Patient Active Problem List   Diagnosis Date Noted  . PSVT (paroxysmal supraventricular tachycardia) (HCC) 09/12/2015  . Chest pain 05/03/2015  . Dyspnea 05/03/2015  . ALLERGIC RHINITIS 09/21/2007  . G E R D 09/21/2007  . SMOKE INHALATION 09/21/2007  . OSTEOPOROSIS 01/28/2007  . PALPITATIONS 01/28/2007  . COUGH 01/28/2007  . ALLERGY 01/28/2007    Harriet Butte, PT,  MPT [facility] [address] [place], [place], [address] Phone: [phone]   Fax:  [phone] 06/19/17, 12:49 PM  Name: Lee Nelson MRN: [id number] Date of Birth: 09/04/1940

## 2017-06-19 NOTE — Patient Instructions (Signed)
Chin Tuck and Chest Lift Against Door frame    Stand against door frame as you normally do.  Pull shoulders back and toward wall while pulling head straight back, not tilted back. Keep jaw and eyes level. Hold _30__ seconds. Perform _3-4__ reps.  Copyright  VHI. All rights reserved.

## 2017-06-22 ENCOUNTER — Encounter: Payer: Self-pay | Admitting: Physical Therapy

## 2017-06-22 ENCOUNTER — Ambulatory Visit: Payer: BLUE CROSS/BLUE SHIELD | Admitting: Physical Therapy

## 2017-06-22 VITALS — BP 110/52 | HR 52

## 2017-06-22 DIAGNOSIS — R293 Abnormal posture: Secondary | ICD-10-CM

## 2017-06-22 DIAGNOSIS — R41841 Cognitive communication deficit: Secondary | ICD-10-CM | POA: Diagnosis not present

## 2017-06-22 DIAGNOSIS — R2681 Unsteadiness on feet: Secondary | ICD-10-CM

## 2017-06-22 DIAGNOSIS — R2689 Other abnormalities of gait and mobility: Secondary | ICD-10-CM | POA: Diagnosis not present

## 2017-06-22 DIAGNOSIS — M6281 Muscle weakness (generalized): Secondary | ICD-10-CM | POA: Diagnosis not present

## 2017-06-22 NOTE — Therapy (Signed)
[facility] [address] [place], [place], [address] Phone: [phone]   Fax:  [phone]  Physical Therapy Treatment  Patient Details  Name: Lee Nelson MRN: [id number] Date of Birth: [birth year] Referring Provider: Andrey Spearman, MD   Encounter Date: 06/22/2017  PT End of Session - 06/22/17 1248    Visit Number  7    Number of Visits  17    Date for PT Re-Evaluation  07/24/17    Authorization Type  BCBS    PT Start Time  0930    PT Stop Time  1014    PT Time Calculation (min)  44 min    Equipment Utilized During Treatment  Gait belt    Activity Tolerance  Patient tolerated treatment well    Behavior During Therapy  [facility] for tasks assessed/performed       Past Medical History:  Diagnosis Date  . Anxiety   . Arthritis   . Bladder calculus   . BPH (benign prostatic hyperplasia)   . Chronic constipation   . Complication of anesthesia    " I had some coughing afterwards for a couple of days"--  per pt "perfers spinal anesthesia since general anesthesia congnitive issues when older"  . Diverticulosis of colon   . Dry eye syndrome of both eyes   . Environmental allergies   . GERD (gastroesophageal reflux disease)    occasional  . History of adenomatous polyp of colon    08/ 2004  . History of kidney stones   . History of squamous cell carcinoma in situ (SCCIS) of skin    s/p  excision 2013 facial areas and 06/ 2016 nose  . Migraine    eye migraine occasional  . Seasonal and perennial allergic rhinitis   . Thrombocytopenia ([place])   . Tingling    hands and feet bilat , intermittantly-- per pt has lumbar bulging disk  . Urinary frequency   . Vocal fold atrophy    dysphonia-- per pt has to drink large amount of water to take even on pill  . Wears glasses     Past Surgical History:  Procedure Laterality Date  . APPENDECTOMY  child  . CARDIOVASCULAR STRESS TEST  05-17-2015  dr hilty   Low risk  nuclear study w/ no ischemia/  normal LV function and wall motion , stress ef 54% (lvef 45-54%)  . CHOLECYSTECTOMY N/A 09/29/2014   Procedure: LAPAROSCOPIC CHOLECYSTECTOMY WITH INTRAOPERATIVE CHOLANGIOGRAM;  Surgeon: Alphonsa Overall, MD;  Location: [facility];  Service: General;  Laterality: N/A;  . COLONOSCOPY  last one 09-06-2010  . ESOPHAGOGASTRODUODENOSCOPY N/A 12/09/2013   Procedure: ESOPHAGOGASTRODUODENOSCOPY (EGD);  Surgeon: Arta Silence, MD;  Location: [facility] END[facility]COPY;  Service: Endoscopy;  Laterality: N/A;  . EXTRACORPOREAL SHOCK WAVE LITHOTRIPSY  yrs ago  . INGUINAL HERNIA REPAIR Left child  . NISSEN FUNDOPLICATION  6378'H   open  . STONE EXTRACTION WITH BASKET N/A 08/18/2016   Procedure: STONE EXTRACTION WITH BASKET;  Surgeon: Carolan Clines, MD;  Location: [facility];  Service: Urology;  Laterality: N/A;  . THULIUM LASER TURP (TRANSURETHRAL RESECTION OF PROSTATE) N/A 08/18/2016   Procedure: THULIUM LASER BLADDER NECK INCISION AND BLADDER STONE REMOVAL;  Surgeon: Carolan Clines, MD;  Location: [facility];  Service: Urology;  Laterality: N/A;  . TONSILLECTOMY  child  . TRANSTHORACIC ECHOCARDIOGRAM  11/18/2010   grade 1 diastolic dysfunction, ef 88-50%/  trivial MR and TR/ mild dilated RA    Vitals:  06/22/17 0931 06/22/17 1231  BP: (!) 107/57 (!) 110/52  Pulse: (!) 56 (!) 52    Subjective Assessment - 06/22/17 0931    Subjective  Pt reports that he stil is not sleeping well and continues with mild R low back and hip pain today.     Pertinent History  memory loss, osteoperosis, degenerative disc disease in cervical spine    Limitations  House hold activities;Walking    Currently in Pain?  Yes    Pain Score  3     Pain Location  Back    Pain Orientation  Right    Pain Type  Chronic pain    Pain Onset  More than a month ago    Aggravating Factors   standing and sitting long periods of time    Pain Relieving Factors  tylenol, stretching     Multiple Pain Sites  Yes    Pain Score  3    Pain Location  Hip    Pain Orientation  Right    Pain Descriptors / Indicators  Aching    Pain Onset  More than a month ago    Pain Frequency  Intermittent    Aggravating Factors   putting more weight on it         [facility] PT Assessment - 06/22/17 0942      Functional Gait  Assessment   Gait assessed   Yes    Gait Level Surface  Walks 20 ft in less than 7 sec but greater than 5.5 sec, uses assistive device, slower speed, mild gait deviations, or deviates 6-10 in outside of the 12 in walkway width.    Change in Gait Speed  Able to smoothly change walking speed without loss of balance or gait deviation. Deviate no more than 6 in outside of the 12 in walkway width.    Gait with Horizontal Head Turns  Performs head turns smoothly with slight change in gait velocity (eg, minor disruption to smooth gait path), deviates 6-10 in outside 12 in walkway width, or uses an assistive device.    Gait with Vertical Head Turns  Performs task with slight change in gait velocity (eg, minor disruption to smooth gait path), deviates 6 - 10 in outside 12 in walkway width or uses assistive device    Gait and Pivot Turn  Pivot turns safely within 3 sec and stops quickly with no loss of balance.    Step Over Obstacle  Is able to step over one shoe box (4.5 in total height) without changing gait speed. No evidence of imbalance.    Gait with Narrow Base of Support  Ambulates 7-9 steps.    Gait with Eyes Closed  Walks 20 ft, no assistive devices, good speed, no evidence of imbalance, normal gait pattern, deviates no more than 6 in outside 12 in walkway width. Ambulates 20 ft in less than 7 sec.    Ambulating Backwards  Walks 20 ft, no assistive devices, good speed, no evidence for imbalance, normal gait    Steps  Alternating feet, must use rail.    Total Score  24         [facility] Adult PT Treatment/Exercise - 06/22/17 1235      Transfers   Transfers  Sit to Stand;Stand  to Sit    Sit to Stand  7: Independent    Stand to Sit  7: Independent    Number of Reps  Other reps (comment) x 5 times during sitting rest  breaks.       Ambulation/Gait   Ambulation/Gait  Yes    Ambulation/Gait Assistance  5: Supervision    Ambulation/Gait Assistance Details  Pt was able to improve forward flexed gait and decreased shuffling with inital verbal cues for upright posture.     Ambulation Distance (Feet)  500 Feet plus 50 feet x 4 reps around clinic.     Assistive device  None    Gait Pattern  Step-through pattern;Decreased stride length;Right flexed knee in stance;Left flexed knee in stance;Shuffle;Trunk flexed    Ambulation Surface  Level;Indoor    Gait velocity  3.59 ft/ sec without AD    Stairs  Yes    Stairs Assistance  6: Modified independent (Device/Increase time)    Stair Management Technique  One rail Right;Alternating pattern;Forwards;Other (comment) x 1 rep ascending and descending    Number of Stairs  4    Height of Stairs  6          Balance Exercises - 06/22/17 1243      Balance Exercises: Standing   Rockerboard  Anterior/posterior;Lateral;EC;EO;30 seconds;Other (comment);Intermittent UE support;10 reps;UE support      Balance Exercises: Standing   Rebounder Limitations  Using medium rockerboard with occasional UE at parallel bars for safety; SPTA with min assistance. Anterior/ posterior x 10 reps with 5 sec holds; lateral weight shifting x 10 reps with 5 sec holds each, followed by open stance holding balance x 30 sec x 2 reps with EC. Pt had consistent posterior leaning with EC with open stance during attempting to regain his upright balance. Pt required verbal and demo cues for performance of task. SPTA provided min assistance and use of gait belt.         PT Education - 06/22/17 1247    Education provided  Yes    Education Details  Reprinted previous HEP's and answered patients questions regarding which exercises he should be doing at home versus  ones at PT only.     Person(s) Educated  Patient    Methods  Handout;Explanation    Comprehension  Verbalized understanding       PT Short Term Goals - 06/22/17 1248      PT SHORT TERM GOAL #1   Title  Pt will initiate HEP in order to indicate decreased fall risk and improved functional mobility.  (Target Date: 06/24/17)    Baseline  06/22/17: Pt verbalized performance of HEP in order to decrease fall risk and improve functional mobility.     Status  Achieved    Target Date  06/24/17      PT SHORT TERM GOAL #2   Title  Pt will improve FGA to 25/30 in order to indicate decreased fall risk.      Baseline  06/22/2017: 24/30 FGA today.     Period  Weeks    Status  Partially Met      PT SHORT TERM GOAL #3   Title  Pt will ambulate with gait speed >/=3.58 ft/sec in order to indicate more normal gait speed.      Baseline  06/22/2016: Gait speed scored 3.59 ft/sec indicating more normal gait speed.    Period  Weeks    Status  Achieved      PT SHORT TERM GOAL #4   Title  Pt will ambulate up to 500' at independent level with improved posture and decreased shuffled gait in order to indicate decreased fall risk.      Baseline  Pt ambulated 500'  feet at independent level with improved posture and decreased shuffled gait to indicate decreased fall risk.     Period  Weeks    Status  Achieved        PT Long Term Goals - 05/25/17 1314      PT LONG TERM GOAL #1   Title  Pt will be independent with HEP in order to indicate decreased fall risk and improved functional mobility.  (Target Date: 07/24/17)    Time  8    Period  Weeks    Status  New      PT LONG TERM GOAL #2   Title  Pt will improve FGA to 28/30 in order to indicate decreased fall risk.      Time  8    Period  Weeks    Status  New      PT LONG TERM GOAL #3   Title  Pt will ambulate >1000' independently over varying outdoor surfaces (including curb step and grass) in order to indicate return to community and leisure activity.      Time  8    Period  Weeks    Status  New      PT LONG TERM GOAL #4   Title  Will add SOT goal as it is assessed    Time  8    Period  Weeks    Status  New        Plan - 06/22/17 1252    Clinical Impression Statement  Today's session focused on checking STG's #1-4 and working on standing balance exercises. Pt was able to acheive goals regarding increased gait speed, gait distance, and HEP for improved balance. However, pt partially met FGA but had made improvement since PT IE during this test. Pt is demonstrating progress towards PT goals and would benefit from continued PT services towards LTG's.     History and Personal Factors relevant to plan of care:  see above    Clinical Presentation  Evolving    Clinical Presentation due to:  see above    Clinical Decision Making  Moderate    Rehab Potential  Good    Clinical Impairments Affecting Rehab Potential  co-morbidities    PT Frequency  2x / week    PT Duration  8 weeks    PT Treatment/Interventions  ADLs/Self Care Home Management;DME Instruction;Gait training;Stair training;Functional mobility training;Therapeutic activities;Therapeutic exercise;Balance training;Neuromuscular re-education;Patient/family education;Orthotic Fit/Training;Passive range of motion;Vestibular    PT Next Visit Plan  BP PRN (DBP should remain above 42 (per pt report) ; SOT has been performed at second visit. Primary PT to update goals. If STG is set for SOT will need to be checked at next visit. Also plan to progress static and dynamic balance exercises. Progress BLE strengthening exercises; Postural exercises and improved gait quality with stride length, posture and heel to toe contact.     Consulted and Agree with Plan of Care  Patient       Patient will benefit from skilled therapeutic intervention in order to improve the following deficits and impairments:  Decreased balance, Decreased mobility, Decreased range of motion, Decreased strength, Abnormal gait,  Impaired flexibility, Impaired perceived functional ability, Impaired sensation, Improper body mechanics, Postural dysfunction  Visit Diagnosis: Unsteadiness on feet  Muscle weakness (generalized)  Abnormal posture     Problem List Patient Active Problem List   Diagnosis Date Noted  . PSVT (paroxysmal supraventricular tachycardia) ([place]) 09/12/2015  . Chest pain 05/03/2015  . Dyspnea 05/03/2015  .  ALLERGIC RHINITIS 09/21/2007  . G E R D 09/21/2007  . SMOKE INHALATION 09/21/2007  . OSTEOPOROSIS 01/28/2007  . PALPITATIONS 01/28/2007  . COUGH 01/28/2007  . ALLERGY 01/28/2007    Carlena Sax, SPTA 06/22/2017, 12:58 PM  [facility] [address] [place], [place], [address] Phone: [phone]   Fax:  [phone]  Name: CASEN PRYOR MRN: [id number] Date of Birth: 07/23/1940  This note has been reviewed and edited by supervising CI.  Willow Ora, PTA, [place] [address], [place], [place] [address] [phone] 06/22/17, 2:33 PM

## 2017-06-26 ENCOUNTER — Ambulatory Visit: Payer: BLUE CROSS/BLUE SHIELD | Admitting: Speech Pathology

## 2017-06-26 ENCOUNTER — Encounter: Payer: Self-pay | Admitting: Rehabilitation

## 2017-06-26 ENCOUNTER — Ambulatory Visit: Payer: BLUE CROSS/BLUE SHIELD | Admitting: Rehabilitation

## 2017-06-26 DIAGNOSIS — R2689 Other abnormalities of gait and mobility: Secondary | ICD-10-CM | POA: Diagnosis not present

## 2017-06-26 DIAGNOSIS — R41841 Cognitive communication deficit: Secondary | ICD-10-CM | POA: Diagnosis not present

## 2017-06-26 DIAGNOSIS — R2681 Unsteadiness on feet: Secondary | ICD-10-CM | POA: Diagnosis not present

## 2017-06-26 DIAGNOSIS — M6281 Muscle weakness (generalized): Secondary | ICD-10-CM | POA: Diagnosis not present

## 2017-06-26 DIAGNOSIS — R293 Abnormal posture: Secondary | ICD-10-CM | POA: Diagnosis not present

## 2017-06-26 NOTE — Patient Instructions (Signed)
   Cognitive Activities you can do at home:   - Solitaire  - Majong  - Scrabble  - Chess/Checkers  - Crosswords (easy level)  - Juanna CaoUno  - Card Games  - Board Games  - Connect 4  - Simon  - the Memory Game  - Dominoes  - Backgammon  - Jig Saw puzzle  - Taboo  - Scattergories  - Geophysical data processorutburst jr   On Print production planneryour computer, tablet or phone: Museum/gallery exhibitions officerBrainHQ Rush Hour App  Tips for Talking with People who have Aphasia  . Say one thing at a time . Don't  rush - slow down, be patient . Talk face to face . Reduce background noise . Relax - be natural . Use pen and paper . Write down key words . Draw diagrams or pictures . Don't pretend you understand . Ask what helps . Recap - check you both understand . Be a partner, not a therapist   Aphasia does not affect intelligence, only language. The person with aphasia can still: make decisions, have opinions, and socialize.   Describing words  What group does it belong to?  What do I use it for?  Where can I find it?  What does it LOOK like?  What other words go with it?  What is the 1st sound of the word?          Many Ways to Communicate Describe it Write it Draw it Gesture it Use related words  There's an App for that: Family Feud, Heads up, Stop-fun categories, What if, Junie BameConversation TherAPPy  Provided by: Rolin BarryLaura L. ST, [phone]

## 2017-06-26 NOTE — Patient Instructions (Signed)
Outer Hip Stretch: Reclined IT Band Stretch (Strap)    Strap around right foot, pull across only as far as possible with shoulders on mat. Hold for __30-45__ breaths. Repeat _2___ times each leg. Do 2-3 times per day if you can.    Copyright  VHI. All rights reserved.   IT Band: Leg Hang (Side-Lying)    Lie on side with right leg on top. Keep hip and knee straight. Move top leg behind and hang over edge. Hold _30-45__ seconds. Relax. Repeat _2__ times. Do _2-3__ times a day. Repeat on other side. (Really only need to do right side).     Copyright  VHI. All rights reserved.   Hamstring Stretch, Reclined (Strap, Doorframe)    Lengthen bottom leg on floor. Extend top leg and press foot up into yoga strap. Hold for _30-45___ breaths. Repeat _2___ times each leg. Do 2-3 times per day.   Copyright  VHI. All rights reserved.

## 2017-06-26 NOTE — Therapy (Signed)
[facility] [address] [place], [place], [address] Phone: [phone]   Fax:  [phone]  Physical Therapy Treatment  Patient Details  Name: Lee Nelson MRN: [id number] Date of Birth: [birth year] Referring Provider: Andrey Spearman, MD   Encounter Date: 06/26/2017  PT End of Session - 06/26/17 1047    Visit Number  8    Number of Visits  17    Date for PT Re-Evaluation  07/24/17    Authorization Type  BCBS    PT Start Time  0934    PT Stop Time  1015    PT Time Calculation (min)  41 min    Equipment Utilized During Treatment  Gait belt    Activity Tolerance  Patient tolerated treatment well    Behavior During Therapy  [facility] for tasks assessed/performed       Past Medical History:  Diagnosis Date  . Anxiety   . Arthritis   . Bladder calculus   . BPH (benign prostatic hyperplasia)   . Chronic constipation   . Complication of anesthesia    " I had some coughing afterwards for a couple of days"--  per pt "perfers spinal anesthesia since general anesthesia congnitive issues when older"  . Diverticulosis of colon   . Dry eye syndrome of both eyes   . Environmental allergies   . GERD (gastroesophageal reflux disease)    occasional  . History of adenomatous polyp of colon    08/ 2004  . History of kidney stones   . History of squamous cell carcinoma in situ (SCCIS) of skin    s/p  excision 2013 facial areas and 06/ 2016 nose  . Migraine    eye migraine occasional  . Seasonal and perennial allergic rhinitis   . Thrombocytopenia ([place])   . Tingling    hands and feet bilat , intermittantly-- per pt has lumbar bulging disk  . Urinary frequency   . Vocal fold atrophy    dysphonia-- per pt has to drink large amount of water to take even on pill  . Wears glasses     Past Surgical History:  Procedure Laterality Date  . APPENDECTOMY  child  . CARDIOVASCULAR STRESS TEST  05-17-2015  dr hilty   Low risk  nuclear study w/ no ischemia/  normal LV function and wall motion , stress ef 54% (lvef 45-54%)  . CHOLECYSTECTOMY N/A 09/29/2014   Procedure: LAPAROSCOPIC CHOLECYSTECTOMY WITH INTRAOPERATIVE CHOLANGIOGRAM;  Surgeon: Alphonsa Overall, MD;  Location: [facility];  Service: General;  Laterality: N/A;  . COLONOSCOPY  last one 09-06-2010  . ESOPHAGOGASTRODUODENOSCOPY N/A 12/09/2013   Procedure: ESOPHAGOGASTRODUODENOSCOPY (EGD);  Surgeon: Arta Silence, MD;  Location: [facility];  Service: Endoscopy;  Laterality: N/A;  . EXTRACORPOREAL SHOCK WAVE LITHOTRIPSY  yrs ago  . INGUINAL HERNIA REPAIR Left child  . NISSEN FUNDOPLICATION  4128'N   open  . STONE EXTRACTION WITH BASKET N/A 08/18/2016   Procedure: STONE EXTRACTION WITH BASKET;  Surgeon: Carolan Clines, MD;  Location: [facility];  Service: Urology;  Laterality: N/A;  . THULIUM LASER TURP (TRANSURETHRAL RESECTION OF PROSTATE) N/A 08/18/2016   Procedure: THULIUM LASER BLADDER NECK INCISION AND BLADDER STONE REMOVAL;  Surgeon: Carolan Clines, MD;  Location: [facility];  Service: Urology;  Laterality: N/A;  . TONSILLECTOMY  child  . TRANSTHORACIC ECHOCARDIOGRAM  11/18/2010   grade 1 diastolic dysfunction, ef 86-76%/  trivial MR and TR/ mild dilated RA    There were no  vitals filed for this visit.  Subjective Assessment - 06/26/17 0939    Subjective  "I want to go over some of the exercises and tell me which ones I should do."     Pertinent History  memory loss, osteoperosis, degenerative disc disease in cervical spine    Limitations  House hold activities;Walking    Patient Stated Goals  "To get rid of the hip pain and figure out whats going on with my balance."     Currently in Pain?  Yes    Pain Score  2     Pain Location  Hip    Pain Orientation  Right    Pain Descriptors / Indicators  Sharp    Pain Type  Chronic pain    Pain Onset  More than a month ago    Pain Frequency  Intermittent    Aggravating  Factors   being still, during the night    Pain Relieving Factors  tylenol, movement.                 No data recorded       [facility] Adult PT Treatment/Exercise - 06/26/17 0940      Self-Care   Self-Care  Other Self-Care Comments    Other Self-Care Comments   Went through pts notebook of exercises both from this episode of care and from previous care for pelvic floor strengthening.  Note that pt had several copies of same exercises in front of notebook that was causing confusion.  PT removed these and placed on original copy in notebook to avoid confusion.  Also recommend he do pelvic floor exercises as needed.  He was doing sit<>stand but wanted to add PWR move as in last session, therefore did provide picture of this with max cues to only perform the sit to stand exercise and no others on sheet.  Pt verbalized understanding.  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  He reports they have elevating desks, however they do not elevate enough.  Asked whether he had a podium of any sort.  He reports they removed these and replaced with new elevating desks.  PT educated on importance of having support surface as it relates to posture and also keeping papers nearer to eye level. Pt requested note from PT with recommendations for use of podium or other alternate desk to improve posture/ergonomics at work.  PT did this and will place in note.        Exercises   Exercises  Other Exercises    Other Exercises   Pt continues to report increased R hip pain that radiates down side of leg.  PT suspects IT band tightness and upon assessment, did note tightness. therefore performed stretch during session x 30 secs with use of strap across body and in L SL position.  Pt requested picture of both so that he could decide which one he preferred.  During IT band  stretch, also noted marked hamstring tightness with approx 20 deg knee flex maintained, therefore performed passive B hamstring stretch x 1 min on each side.  Pt reports he has yoga strap that he could use for this at home, therefore provided picture for improved carryover.  Pt verbalized understanding.              PT Education - 06/26/17  1047    Education provided  Yes    Education Details  see self care    Person(s) Educated  Patient    Methods  Explanation    Comprehension  Verbalized understanding       PT Short Term Goals - 06/22/17 1248      PT SHORT TERM GOAL #1   Title  Pt will initiate HEP in order to indicate decreased fall risk and improved functional mobility.  (Target Date: 06/24/17)    Baseline  06/22/17: Pt verbalized performance of HEP in order to decrease fall risk and improve functional mobility.     Status  Achieved    Target Date  06/24/17      PT SHORT TERM GOAL #2   Title  Pt will improve FGA to 25/30 in order to indicate decreased fall risk.      Baseline  06/22/2017: 24/30 FGA today.     Period  Weeks    Status  Partially Met      PT SHORT TERM GOAL #3   Title  Pt will ambulate with gait speed >/=3.58 ft/sec in order to indicate more normal gait speed.      Baseline  06/22/2016: Gait speed scored 3.59 ft/sec indicating more normal gait speed.    Period  Weeks    Status  Achieved      PT SHORT TERM GOAL #4   Title  Pt will ambulate up to 500' at independent level with improved posture and decreased shuffled gait in order to indicate decreased fall risk.      Baseline  Pt ambulated 500' feet at independent level with improved posture and decreased shuffled gait to indicate decreased fall risk.     Period  Weeks    Status  Achieved        PT Long Term Goals - 06/22/17 1714      PT LONG TERM GOAL #1   Title  Pt will be independent with HEP in order to indicate decreased fall risk and improved functional mobility.  (Target Date: 07/24/17)    Time  8     Period  Weeks    Status  New      PT LONG TERM GOAL #2   Title  Pt will improve FGA to 28/30 in order to indicate decreased fall risk.      Time  8    Period  Weeks    Status  New      PT LONG TERM GOAL #3   Title  Pt will ambulate >1000' independently over varying outdoor surfaces (including curb step and grass) in order to indicate return to community and leisure activity.     Time  8    Period  Weeks    Status  New      PT LONG TERM GOAL #4   Title  Pt will improve SOT composite score to 70 in order to indicate decreased fall risk.     Time  8    Period  Weeks    Status  New            Plan - 06/26/17 1047    Clinical Impression Statement  Skilled session focused on organizing pts notebook for most current and important HEP exercises.  Also addressed LE flexibility with IT band and hamstring stretch as it relates to R hip pain.  Also made recommendations for improved surface to stand and teach from to promote improved posture.  Rehab Potential  Good    Clinical Impairments Affecting Rehab Potential  co-morbidities    PT Frequency  2x / week    PT Duration  8 weeks    PT Treatment/Interventions  ADLs/Self Care Home Management;DME Instruction;Gait training;Stair training;Functional mobility training;Therapeutic activities;Therapeutic exercise;Balance training;Neuromuscular re-education;Patient/family education;Orthotic Fit/Training;Passive range of motion;Vestibular    PT Next Visit Plan  BP PRN (DBP should remain above 42 (per pt report) ; Postural exercises and improved gait quality with stride length, posture and heel to toe contact.     Consulted and Agree with Plan of Care  Patient       Patient will benefit from skilled therapeutic intervention in order to improve the following deficits and impairments:  Decreased balance, Decreased mobility, Decreased range of motion, Decreased strength, Abnormal gait, Impaired flexibility, Impaired perceived functional ability,  Impaired sensation, Improper body mechanics, Postural dysfunction  Visit Diagnosis: Unsteadiness on feet  Muscle weakness (generalized)  Abnormal posture  Other abnormalities of gait and mobility     Problem List Patient Active Problem List   Diagnosis Date Noted  . PSVT (paroxysmal supraventricular tachycardia) ([place]) 09/12/2015  . Chest pain 05/03/2015  . Dyspnea 05/03/2015  . ALLERGIC RHINITIS 09/21/2007  . G E R D 09/21/2007  . SMOKE INHALATION 09/21/2007  . OSTEOPOROSIS 01/28/2007  . PALPITATIONS 01/28/2007  . COUGH 01/28/2007  . ALLERGY 01/28/2007    Cameron Sprang, PT, MPT [facility] [address] [place], [place], [address] Phone: [phone]   Fax:  [phone] 06/26/17, 10:51 AM  Name: AUTHUR CUBIT MRN: [id number] Date of Birth: 1940/04/13   Note given to Lee Nelson at end of session:  To Whom it May Concern: I am seeing Lee Nelson at [facility] for physical therapy in regards to his fall risk which includes addressing his posture.  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  I would recommend a podium or other desk option that will elevate to appropriate height in which he has upper extremity support promoting upright posture and paperwork nearer to eye level to also promote improved posture.    Thanks,  Cameron Sprang, MPT, [place] [phone]

## 2017-06-29 ENCOUNTER — Encounter: Payer: Self-pay | Admitting: Rehabilitation

## 2017-06-29 ENCOUNTER — Ambulatory Visit: Payer: BLUE CROSS/BLUE SHIELD | Attending: Diagnostic Neuroimaging | Admitting: Rehabilitation

## 2017-06-29 DIAGNOSIS — R41841 Cognitive communication deficit: Secondary | ICD-10-CM | POA: Insufficient documentation

## 2017-06-29 DIAGNOSIS — R293 Abnormal posture: Secondary | ICD-10-CM

## 2017-06-29 DIAGNOSIS — R2681 Unsteadiness on feet: Secondary | ICD-10-CM | POA: Insufficient documentation

## 2017-06-29 DIAGNOSIS — M6281 Muscle weakness (generalized): Secondary | ICD-10-CM

## 2017-06-29 DIAGNOSIS — R2689 Other abnormalities of gait and mobility: Secondary | ICD-10-CM | POA: Diagnosis not present

## 2017-06-29 NOTE — Therapy (Signed)
[facility] [address] [place], [place], [address] Phone: [phone]   Fax:  [phone]  Physical Therapy Treatment  Patient Details  Name: Lee Nelson MRN: [id number] Date of Birth: 09-Jul-1940 Referring Provider: Andrey Spearman, MD   Encounter Date: 06/29/2017  PT End of Session - 06/29/17 1218    Visit Number  9    Number of Visits  17    Date for PT Re-Evaluation  07/24/17    Authorization Type  BCBS    PT Start Time  0932    PT Stop Time  1015    PT Time Calculation (min)  43 min    Equipment Utilized During Treatment  Gait belt    Activity Tolerance  Patient tolerated treatment well    Behavior During Therapy  [facility] for tasks assessed/performed       Past Medical History:  Diagnosis Date  . Anxiety   . Arthritis   . Bladder calculus   . BPH (benign prostatic hyperplasia)   . Chronic constipation   . Complication of anesthesia    " I had some coughing afterwards for a couple of days"--  per pt "perfers spinal anesthesia since general anesthesia congnitive issues when older"  . Diverticulosis of colon   . Dry eye syndrome of both eyes   . Environmental allergies   . GERD (gastroesophageal reflux disease)    occasional  . History of adenomatous polyp of colon    08/ 2004  . History of kidney stones   . History of squamous cell carcinoma in situ (SCCIS) of skin    s/p  excision 2013 facial areas and 06/ 2016 nose  . Migraine    eye migraine occasional  . Seasonal and perennial allergic rhinitis   . Thrombocytopenia ([place])   . Tingling    hands and feet bilat , intermittantly-- per pt has lumbar bulging disk  . Urinary frequency   . Vocal fold atrophy    dysphonia-- per pt has to drink large amount of water to take even on pill  . Wears glasses     Past Surgical History:  Procedure Laterality Date  . APPENDECTOMY  child  . CARDIOVASCULAR STRESS TEST  05-17-2015  dr hilty   Low risk  nuclear study w/ no ischemia/  normal LV function and wall motion , stress ef 54% (lvef 45-54%)  . CHOLECYSTECTOMY N/A 09/29/2014   Procedure: LAPAROSCOPIC CHOLECYSTECTOMY WITH INTRAOPERATIVE CHOLANGIOGRAM;  Surgeon: Alphonsa Overall, MD;  Location: [facility];  Service: General;  Laterality: N/A;  . COLONOSCOPY  last one 09-06-2010  . ESOPHAGOGASTRODUODENOSCOPY N/A 12/09/2013   Procedure: ESOPHAGOGASTRODUODENOSCOPY (EGD);  Surgeon: Arta Silence, MD;  Location: [facility];  Service: Endoscopy;  Laterality: N/A;  . EXTRACORPOREAL SHOCK WAVE LITHOTRIPSY  yrs ago  . INGUINAL HERNIA REPAIR Left child  . NISSEN FUNDOPLICATION  8341'D   open  . STONE EXTRACTION WITH BASKET N/A 08/18/2016   Procedure: STONE EXTRACTION WITH BASKET;  Surgeon: Carolan Clines, MD;  Location: [facility];  Service: Urology;  Laterality: N/A;  . THULIUM LASER TURP (TRANSURETHRAL RESECTION OF PROSTATE) N/A 08/18/2016   Procedure: THULIUM LASER BLADDER NECK INCISION AND BLADDER STONE REMOVAL;  Surgeon: Carolan Clines, MD;  Location: [facility];  Service: Urology;  Laterality: N/A;  . TONSILLECTOMY  child  . TRANSTHORACIC ECHOCARDIOGRAM  11/18/2010   grade 1 diastolic dysfunction, ef 62-22%/  trivial MR and TR/ mild dilated RA    There were no  vitals filed for this visit.  Subjective Assessment - 06/29/17 0934    Subjective  "I slept really good last night and I don't have much pain this morning."     Pertinent History  memory loss, osteoperosis, degenerative disc disease in cervical spine    Limitations  House hold activities;Walking    Patient Stated Goals  "To get rid of the hip pain and figure out whats going on with my balance."     Currently in Pain?  Yes    Pain Score  1     Pain Location  Back    Pain Orientation  Right    Pain Descriptors / Indicators  Aching    Pain Type  Chronic pain    Pain Onset  More than a month ago    Pain Frequency  Intermittent    Aggravating  Factors   during the night, being still    Pain Relieving Factors  tylenol, movement                       [facility] Adult PT Treatment/Exercise - 06/29/17 0954      Ambulation/Gait   Ambulation/Gait  Yes    Ambulation/Gait Assistance  5: Supervision    Ambulation/Gait Assistance Details  Continue to work on gait and exercices to improve posture during mobility, increased stride length and step length along with improved arm swing.  Performed gait x 500' with cues for heel to toe contact, upright posture, and increased arm swing.  Note that he still needs intermittent cues for posture throughout.  Therefore added foam roll (half) to back (with use of seat belt) to provide further feedback on upright posture.  This did promote instant feedback on posture, however did need cues towards the end of gait due to fatigue.      Ambulation Distance (Feet)  500 Feet then another 300'    Assistive device  None    Gait Pattern  Step-through pattern;Decreased stride length;Right flexed knee in stance;Left flexed knee in stance;Shuffle;Trunk flexed    Ambulation Surface  Level;Indoor      Self-Care   Self-Care  Other Self-Care Comments    Other Self-Care Comments   Continue to educate on HEP with stretches.        Neuro Re-ed    Neuro Re-ed Details   Forward/retro stepping with single UE support (with foam roller on back for feedback on posture) with opposite arm flex with forward stepping and UE extension with retrostepping      Exercises   Exercises  Other Exercises    Other Exercises   Performed B hip flex stretch (in [place] test position) with leg off edge of mat x 1 min on each side with slight over pressure from PT.  Went over two stretches from last week, see pt instruction.  Also performed pectoral stretch with towel along spine in "T" position and also moving towards "Y" position.               PT Education - 06/29/17 [id number]    Education provided  Yes    Education Details   see self care    Person(s) Educated  Patient    Methods  Explanation    Comprehension  Verbalized understanding       PT Short Term Goals - 06/22/17 1248      PT SHORT TERM GOAL #1   Title  Pt will initiate HEP in order to indicate decreased fall risk  and improved functional mobility.  (Target Date: 06/24/17)    Baseline  06/22/17: Pt verbalized performance of HEP in order to decrease fall risk and improve functional mobility.     Status  Achieved    Target Date  06/24/17      PT SHORT TERM GOAL #2   Title  Pt will improve FGA to 25/30 in order to indicate decreased fall risk.      Baseline  06/22/2017: 24/30 FGA today.     Period  Weeks    Status  Partially Met      PT SHORT TERM GOAL #3   Title  Pt will ambulate with gait speed >/=3.58 ft/sec in order to indicate more normal gait speed.      Baseline  06/22/2016: Gait speed scored 3.59 ft/sec indicating more normal gait speed.    Period  Weeks    Status  Achieved      PT SHORT TERM GOAL #4   Title  Pt will ambulate up to 500' at independent level with improved posture and decreased shuffled gait in order to indicate decreased fall risk.      Baseline  Pt ambulated 500' feet at independent level with improved posture and decreased shuffled gait to indicate decreased fall risk.     Period  Weeks    Status  Achieved        PT Long Term Goals - 06/22/17 1714      PT LONG TERM GOAL #1   Title  Pt will be independent with HEP in order to indicate decreased fall risk and improved functional mobility.  (Target Date: 07/24/17)    Time  8    Period  Weeks    Status  New      PT LONG TERM GOAL #2   Title  Pt will improve FGA to 28/30 in order to indicate decreased fall risk.      Time  8    Period  Weeks    Status  New      PT LONG TERM GOAL #3   Title  Pt will ambulate >1000' independently over varying outdoor surfaces (including curb step and grass) in order to indicate return to community and leisure activity.     Time  8     Period  Weeks    Status  New      PT LONG TERM GOAL #4   Title  Pt will improve SOT composite score to 70 in order to indicate decreased fall risk.     Time  8    Period  Weeks    Status  New            Plan - 06/29/17 1219    Clinical Impression Statement  Skilled session focused on going over BLE stretching from last session as well as anterior chest stretch from previous sessions. Also continue to work on posture with mobility and exercises.  Pt tolerated session well and states he feels he is using back muscles like he hasn't in a while.      Rehab Potential  Good    Clinical Impairments Affecting Rehab Potential  co-morbidities    PT Frequency  2x / week    PT Duration  8 weeks    PT Treatment/Interventions  ADLs/Self Care Home Management;DME Instruction;Gait training;Stair training;Functional mobility training;Therapeutic activities;Therapeutic exercise;Balance training;Neuromuscular re-education;Patient/family education;Orthotic Fit/Training;Passive range of motion;Vestibular    PT Next Visit Plan  BP PRN (DBP should remain above 42 (per pt  report) ; Postural exercises and improved gait quality with stride length, posture and heel to toe contact.     Consulted and Agree with Plan of Care  Patient       Patient will benefit from skilled therapeutic intervention in order to improve the following deficits and impairments:  Decreased balance, Decreased mobility, Decreased range of motion, Decreased strength, Abnormal gait, Impaired flexibility, Impaired perceived functional ability, Impaired sensation, Improper body mechanics, Postural dysfunction  Visit Diagnosis: Unsteadiness on feet  Muscle weakness (generalized)  Abnormal posture  Other abnormalities of gait and mobility     Problem List Patient Active Problem List   Diagnosis Date Noted  . PSVT (paroxysmal supraventricular tachycardia) ([place]) 09/12/2015  . Chest pain 05/03/2015  . Dyspnea 05/03/2015  . ALLERGIC  RHINITIS 09/21/2007  . G E R D 09/21/2007  . SMOKE INHALATION 09/21/2007  . OSTEOPOROSIS 01/28/2007  . PALPITATIONS 01/28/2007  . COUGH 01/28/2007  . ALLERGY 01/28/2007    Cameron Sprang, PT, MPT [facility] [address] [place], [place], [address] Phone: [phone]   Fax:  [phone] 06/29/17, 12:22 PM  Name: Lee Nelson MRN: [id number] Date of Birth: [birth year]

## 2017-06-29 NOTE — Therapy (Signed)
[facility] [address] [place], [place], [address] Phone: [phone]   Fax:  [phone]  Speech Language Pathology Evaluation  Patient Details  Name: Lee Nelson MRN: [id number] Date of Birth: 11-Jun-1940 Referring Provider: Dr. Joycelyn Schmid   Encounter Date: 06/26/2017  End of Session - 06/29/17 1319    Visit Number  1    Number of Visits  17    Date for SLP Re-Evaluation  08/24/17    SLP Start Time  0843    SLP Stop Time   0930    SLP Time Calculation (min)  47 min    Activity Tolerance  Patient tolerated treatment well       Past Medical History:  Diagnosis Date  . Anxiety   . Arthritis   . Bladder calculus   . BPH (benign prostatic hyperplasia)   . Chronic constipation   . Complication of anesthesia    " I had some coughing afterwards for a couple of days"--  per pt "perfers spinal anesthesia since general anesthesia congnitive issues when older"  . Diverticulosis of colon   . Dry eye syndrome of both eyes   . Environmental allergies   . GERD (gastroesophageal reflux disease)    occasional  . History of adenomatous polyp of colon    08/ 2004  . History of kidney stones   . History of squamous cell carcinoma in situ (SCCIS) of skin    s/p  excision 2013 facial areas and 06/ 2016 nose  . Migraine    eye migraine occasional  . Seasonal and perennial allergic rhinitis   . Thrombocytopenia (HCC)   . Tingling    hands and feet bilat , intermittantly-- per pt has lumbar bulging disk  . Urinary frequency   . Vocal fold atrophy    dysphonia-- per pt has to drink large amount of water to take even on pill  . Wears glasses     Past Surgical History:  Procedure Laterality Date  . APPENDECTOMY  child  . CARDIOVASCULAR STRESS TEST  05-17-2015  dr hilty   Low risk nuclear study w/ no ischemia/  normal LV function and wall motion , stress ef 54% (lvef 45-54%)  . CHOLECYSTECTOMY N/A 09/29/2014    Procedure: LAPAROSCOPIC CHOLECYSTECTOMY WITH INTRAOPERATIVE CHOLANGIOGRAM;  Surgeon: Ovidio Kin, MD;  Location: [facility];  Service: General;  Laterality: N/A;  . COLONOSCOPY  last one 09-06-2010  . ESOPHAGOGASTRODUODENOSCOPY N/A 12/09/2013   Procedure: ESOPHAGOGASTRODUODENOSCOPY (EGD);  Surgeon: Willis Modena, MD;  Location: [facility] ENDOSCOPY;  Service: Endoscopy;  Laterality: N/A;  . EXTRACORPOREAL SHOCK WAVE LITHOTRIPSY  yrs ago  . INGUINAL HERNIA REPAIR Left child  . NISSEN FUNDOPLICATION  1980's   open  . STONE EXTRACTION WITH BASKET N/A 08/18/2016   Procedure: STONE EXTRACTION WITH BASKET;  Surgeon: Jethro Bolus, MD;  Location: [facility];  Service: Urology;  Laterality: N/A;  . THULIUM LASER TURP (TRANSURETHRAL RESECTION OF PROSTATE) N/A 08/18/2016   Procedure: THULIUM LASER BLADDER NECK INCISION AND BLADDER STONE REMOVAL;  Surgeon: Jethro Bolus, MD;  Location: [facility];  Service: Urology;  Laterality: N/A;  . TONSILLECTOMY  child  . TRANSTHORACIC ECHOCARDIOGRAM  11/18/2010   grade 1 diastolic dysfunction, ef 55-60%/  trivial MR and TR/ mild dilated RA    There were no vitals filed for this visit.      SLP Evaluation [facility] - 06/29/17 1303      SLP Visit Information   SLP  Received On  06/26/17    Referring Provider  Dr. Joycelyn Schmid    Onset Date  word finding difficulty noted at MD appt 05/08/17    Medical Diagnosis  MCI      Subjective   Subjective  I know what I want to say, but I can't say it    Patient/Family Stated Goal  Improve as much as possible      Pain Assessment   Currently in Pain?  Yes    Pain Score  2     Pain Location  Hip    Pain Orientation  Right    Pain Type  Chronic pain    Pain Onset  More than a month ago    Pain Frequency  Intermittent    Pain Relieving Factors  tylenol, movement      General Information   HPI  77 y.o. year old male here with chronic neck pain x 10 years, int HA x 1-2 years,  h/o  dysphagia and voice changes. Cervical spine bone spurs. Pt received ST in 2016 for voice at [facility].     Behavioral/Cognition  reports changes in memory and problem solving    Mobility Status  walks independently      Balance Screen   Has the patient fallen in the past 6 months  No    Has the patient had a decrease in activity level because of a fear of falling?   No    Is the patient reluctant to leave their home because of a fear of falling?   No      Prior Functional Status   Cognitive/Linguistic Baseline  Baseline deficits    Baseline deficit details  memory vs stress    Type of Home  House     Lives With  Spouse    Available Support  Personal care attendant    Vocation  Full time employment      Cognition   Overall Cognitive Status  Impaired/Different from baseline    Area of Impairment  Memory    Memory  Decreased short-term memory    Attention  Alternating    Alternating Attention  Impaired    Alternating Attention Impairment  Verbal complex;Functional complex    Awareness  Appears intact    Problem Solving  Appears intact    Executive Function  -- Appears intact      Verbal Expression   Overall Verbal Expression  Impaired    Initiation  No impairment    Automatic Speech  -- WFL    Level of Generative/Spontaneous Verbalization  Conversation    Repetition  No impairment    Naming  Impairment    Responsive  77-100% accurate    Confrontation  75-100% accurate    Convergent  75-100% accurate    Divergent  75-100% accurate    Pragmatics  No impairment    Interfering Components  -- Stress      Oral Motor/Sensory Function   Overall Oral Motor/Sensory Function  Appears within functional limits for tasks assessed      Motor Speech   Overall Motor Speech  Impaired at baseline    Respiration  Within functional limits    Phonation  Hoarse    Resonance  Within functional limits    Articulation  Within functional limitis    Intelligibility  Intelligible    Motor Planning   Witnin functional limits      Standardized Assessments   Standardized Assessments   Cognitive Linguistic Quick  Test;[place] Naming Test-2nd edition    [place] Naming Test-2nd edition   53/60  - WNL for age      Cognitive Linguistic Quick Test (Ages 18-69)   Attention  Mild    Memory  Mild    Executive Function  WNL    Language  WNL    Visuospatial Skills  WNL    Severity Rating Total  18    Composite Severity Rating  14.8                      SLP Education - 06/29/17 1318    Education provided  Yes    Education Details  compensations for word finding    Person(s) Educated  Patient    Methods  Explanation;Handout    Comprehension  Verbalized understanding;Need further instruction       SLP Short Term Goals - 06/29/17 1328      SLP SHORT TERM GOAL #1   Title  Pt will complete complex naming tasks with 90% accuracy and rare min A    Time  4    Period  Weeks    Status  New      SLP SHORT TERM GOAL #2   Title  Pt will utilize compensations for dysnomia in structured tasks with rare min A over 2 sessions.    Time  4    Period  Weeks    Status  New      SLP SHORT TERM GOAL #3   Title  Pt will report carryover of 2 compensations for memory/attention outside of therapy over 2 sessions     Time  4    Period  Weeks    Status  New      SLP SHORT TERM GOAL #4   Title  Pt will divide attention between 2 simple cognitive linguistic tasksk with 85% on each and occasional min A     Time  4    Period  Weeks    Status  New       SLP Long Term Goals - 06/29/17 1330      SLP LONG TERM GOAL #1   Title  Pt will utilize compensations for dysnomia in complex conversation over 10 minutes as needed with rare min A    Time  8    Period  Weeks    Status  New      SLP LONG TERM GOAL #2   Title  Pt will report carrover of 4 compensatory strategies for attention/memory outside of therapy over 2 sessions    Time  8    Period  Weeks    Status  New       Plan -  06/29/17 1332    Clinical Impression Statement  Mr. [name] is referred to oupt ST due to c/o of word finding difficulties, He is currently receiving PT also. Mr. 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 His spouse is incomplete quadraplegic requiring round the clock care and Mr. [name] reports significant emotional and financial stress around this. Stress may also adversely affect cognition.  He c/o of word finding difficulties that occur daily. He also reports some memory impairment. He is verbalized strategies he is using to successfully compensate for memory issues at home. Today, 1 semantic paraphasia noted, which was self corrected. On the Cognitive Linguistic Quick Test, Mr. [name] did demonstrate a mild memory impairment and mild attention impairment. He scored WNL on [place] Naming  Test. However, due to the high level of Mr. Hyser' profession, I recommend skilled ST to maximize attention, word finding and train in compensations for attention, memory and word finding for continue success at work and independence at home.        Patient will benefit from skilled therapeutic intervention in order to improve the following deficits and impairments:   Cognitive communication deficit - Plan: SLP plan of care cert/re-cert    Problem List Patient Active Problem List   Diagnosis Date Noted  . PSVT (paroxysmal supraventricular tachycardia) (HCC) 09/12/2015  . Chest pain 05/03/2015  . Dyspnea 05/03/2015  . ALLERGIC RHINITIS 09/21/2007  . G E R D 09/21/2007  . SMOKE INHALATION 09/21/2007  . OSTEOPOROSIS 01/28/2007  . PALPITATIONS 01/28/2007  . COUGH 01/28/2007  . ALLERGY 01/28/2007    Stevey Stapleton, Radene Journey MS, CCC-SLP 06/29/2017, 1:34 PM  [facility] [address] [place], [place], [address] Phone: [phone]   Fax:  [phone]  Name: YACOUB DILTZ MRN: [id number] Date of Birth: 03-Jul-1940

## 2017-06-29 NOTE — Patient Instructions (Signed)
Outer Hip Stretch: Reclined IT Band Stretch (Strap)    Strap around right foot, pull across only as far as possible with shoulders on mat. Hold for __30-45__ breaths. Repeat _2___ times each leg. Do 2-3 times per day if you can.    Copyright  VHI. All rights reserved.   IT Band: Leg Hang (Side-Lying)    Lie on side with right leg on top. Keep hip and knee straight. Move top leg behind and hang over edge. Hold _30-45__ seconds. Relax. Repeat _2__ times. Do _2-3__ times a day. Repeat on other side. (Really only need to do right side).     Copyright  VHI. All rights reserved.   Hamstring Stretch, Reclined (Strap, Doorframe)    Lengthen bottom leg on floor. Extend top leg and press foot up into yoga strap. Hold for _30-45___ breaths. Repeat _2___ times each leg. Do 2-3 times per day.

## 2017-06-30 ENCOUNTER — Ambulatory Visit: Payer: Medicare Other | Admitting: Diagnostic Neuroimaging

## 2017-07-01 DIAGNOSIS — M62838 Other muscle spasm: Secondary | ICD-10-CM | POA: Diagnosis not present

## 2017-07-01 DIAGNOSIS — M6281 Muscle weakness (generalized): Secondary | ICD-10-CM | POA: Diagnosis not present

## 2017-07-01 DIAGNOSIS — R102 Pelvic and perineal pain: Secondary | ICD-10-CM | POA: Diagnosis not present

## 2017-07-01 DIAGNOSIS — N3941 Urge incontinence: Secondary | ICD-10-CM | POA: Diagnosis not present

## 2017-07-03 ENCOUNTER — Ambulatory Visit: Payer: BLUE CROSS/BLUE SHIELD | Admitting: Rehabilitation

## 2017-07-03 ENCOUNTER — Encounter: Payer: Self-pay | Admitting: Rehabilitation

## 2017-07-03 DIAGNOSIS — R293 Abnormal posture: Secondary | ICD-10-CM

## 2017-07-03 DIAGNOSIS — M6281 Muscle weakness (generalized): Secondary | ICD-10-CM

## 2017-07-03 DIAGNOSIS — R2681 Unsteadiness on feet: Secondary | ICD-10-CM | POA: Diagnosis not present

## 2017-07-03 DIAGNOSIS — R41841 Cognitive communication deficit: Secondary | ICD-10-CM | POA: Diagnosis not present

## 2017-07-03 DIAGNOSIS — R2689 Other abnormalities of gait and mobility: Secondary | ICD-10-CM

## 2017-07-03 NOTE — Therapy (Signed)
[facility] [address] [place], [place], [address] Phone: [phone]   Fax:  [phone]  Physical Therapy Treatment  Patient Details  Name: MILIK GILREATH MRN: [id number] Date of Birth: 05-24-1940 Referring Provider: Andrey Spearman, MD   Encounter Date: 07/03/2017  PT End of Session - 07/03/17 0937    Visit Number  10    Number of Visits  17    Date for PT Re-Evaluation  07/24/17    Authorization Type  [employer]    PT Start Time  0930    PT Stop Time  1015    PT Time Calculation (min)  45 min    Equipment Utilized During Treatment  Gait belt    Activity Tolerance  Patient tolerated treatment well    Behavior During Therapy  [facility] for tasks assessed/performed       Past Medical History:  Diagnosis Date  . Anxiety   . Arthritis   . Bladder calculus   . BPH (benign prostatic hyperplasia)   . Chronic constipation   . Complication of anesthesia    " I had some coughing afterwards for a couple of days"--  per pt "perfers spinal anesthesia since general anesthesia congnitive issues when older"  . Diverticulosis of colon   . Dry eye syndrome of both eyes   . Environmental allergies   . GERD (gastroesophageal reflux disease)    occasional  . History of adenomatous polyp of colon    08/ 2004  . History of kidney stones   . History of squamous cell carcinoma in situ (SCCIS) of skin    s/p  excision 2013 facial areas and 06/ 2016 nose  . Migraine    eye migraine occasional  . Seasonal and perennial allergic rhinitis   . Thrombocytopenia ([place])   . Tingling    hands and feet bilat , intermittantly-- per pt has lumbar bulging disk  . Urinary frequency   . Vocal fold atrophy    dysphonia-- per pt has to drink large amount of water to take even on pill  . Wears glasses     Past Surgical History:  Procedure Laterality Date  . APPENDECTOMY  child  . CARDIOVASCULAR STRESS TEST  05-17-2015  dr hilty   Low risk  nuclear study w/ no ischemia/  normal LV function and wall motion , stress ef 54% (lvef 45-54%)  . CHOLECYSTECTOMY N/A 09/29/2014   Procedure: LAPAROSCOPIC CHOLECYSTECTOMY WITH INTRAOPERATIVE CHOLANGIOGRAM;  Surgeon: Alphonsa Overall, MD;  Location: [facility];  Service: General;  Laterality: N/A;  . COLONOSCOPY  last one 09-06-2010  . ESOPHAGOGASTRODUODENOSCOPY N/A 12/09/2013   Procedure: ESOPHAGOGASTRODUODENOSCOPY (EGD);  Surgeon: Arta Silence, MD;  Location: [facility]COPY;  Service: Endoscopy;  Laterality: N/A;  . EXTRACORPOREAL SHOCK WAVE LITHOTRIPSY  yrs ago  . INGUINAL HERNIA REPAIR Left child  . NISSEN FUNDOPLICATION  2703'J   open  . STONE EXTRACTION WITH BASKET N/A 08/18/2016   Procedure: STONE EXTRACTION WITH BASKET;  Surgeon: Carolan Clines, MD;  Location: [facility];  Service: Urology;  Laterality: N/A;  . THULIUM LASER TURP (TRANSURETHRAL RESECTION OF PROSTATE) N/A 08/18/2016   Procedure: THULIUM LASER BLADDER NECK INCISION AND BLADDER STONE REMOVAL;  Surgeon: Carolan Clines, MD;  Location: [facility];  Service: Urology;  Laterality: N/A;  . TONSILLECTOMY  child  . TRANSTHORACIC ECHOCARDIOGRAM  11/18/2010   grade 1 diastolic dysfunction, ef 00-93%/  trivial MR and TR/ mild dilated RA    There were no  vitals filed for this visit.  Subjective Assessment - 07/03/17 0933    Subjective  I didn't sleep well last night.  I did my exercises before bed and I hurt when I laid down and then I woke up with pain.     Pertinent History  memory loss, osteoperosis, degenerative disc disease in cervical spine    Limitations  House hold activities;Walking    Patient Stated Goals  "To get rid of the hip pain and figure out whats going on with my balance."     Currently in Pain?  Yes    Pain Score  2     Pain Location  Back    Pain Orientation  Right    Pain Descriptors / Indicators  Aching    Pain Type  Chronic pain    Pain Radiating Towards  R low back  radiating to R lateral and anterior hip    Pain Onset  More than a month ago    Pain Frequency  Intermittent    Aggravating Factors   during the night, being still    Pain Relieving Factors  tylenol and movement                       [facility] Adult PT Treatment/Exercise - 07/03/17 0954      Ambulation/Gait   Ambulation/Gait  Yes    Ambulation/Gait Assistance  5: Supervision    Ambulation/Gait Assistance Details  Continue to address gait for improved mechanics encouraging improved stride length, gait speed, posture and arm swing.  Pt does improve, but has difficulty maintaining following several laps.      Ambulation Distance (Feet)  600 Feet    Assistive device  None    Gait Pattern  Step-through pattern;Decreased stride length;Right flexed knee in stance;Left flexed knee in stance;Shuffle;Trunk flexed    Ambulation Surface  Level;Indoor      Neuro Re-ed    Neuro Re-ed Details   High level  balance in // bars on small rocker board for ankle and hip strategy; maintaining balance x 2 reps of 30 secs>EC x 4 reps of 15 secs.  Note that he tends to keep weight posterior on heels, therefore had pt stand on ramp facing incline with feet apart EC x 30 secs>feet staggered x 30 secs each direction with EC.  This was no overt challenge, therefore added blue mat and had pt stand feet apart EC x 20 secs>feet together x 20 secs (2 reps).  Continued high level balance standing on foam balance beam maintaining balance (standing perpendicular) x 2 reps of 30 secs with feet apart EC>keeping R foot in stance on foam and advancing/retrostepping LLE over beam.  Cues for decreased UE support and increased step lenght.              PT Education - 07/03/17 0936    Education provided  Yes    Person(s) Educated  Patient    Methods  Explanation    Comprehension  Verbalized understanding       PT Short Term Goals - 06/22/17 1248      PT SHORT TERM GOAL #1   Title  Pt will initiate HEP in  order to indicate decreased fall risk and improved functional mobility.  (Target Date: 06/24/17)    Baseline  06/22/17: Pt verbalized performance of HEP in order to decrease fall risk and improve functional mobility.     Status  Achieved    Target Date  06/24/17  PT SHORT TERM GOAL #2   Title  Pt will improve FGA to 25/30 in order to indicate decreased fall risk.      Baseline  06/22/2017: 24/30 FGA today.     Period  Weeks    Status  Partially Met      PT SHORT TERM GOAL #3   Title  Pt will ambulate with gait speed >/=3.58 ft/sec in order to indicate more normal gait speed.      Baseline  06/22/2016: Gait speed scored 3.59 ft/sec indicating more normal gait speed.    Period  Weeks    Status  Achieved      PT SHORT TERM GOAL #4   Title  Pt will ambulate up to 500' at independent level with improved posture and decreased shuffled gait in order to indicate decreased fall risk.      Baseline  Pt ambulated 500' feet at independent level with improved posture and decreased shuffled gait to indicate decreased fall risk.     Period  Weeks    Status  Achieved        PT Long Term Goals - 06/22/17 1714      PT LONG TERM GOAL #1   Title  Pt will be independent with HEP in order to indicate decreased fall risk and improved functional mobility.  (Target Date: 07/24/17)    Time  8    Period  Weeks    Status  New      PT LONG TERM GOAL #2   Title  Pt will improve FGA to 28/30 in order to indicate decreased fall risk.      Time  8    Period  Weeks    Status  New      PT LONG TERM GOAL #3   Title  Pt will ambulate >1000' independently over varying outdoor surfaces (including curb step and grass) in order to indicate return to community and leisure activity.     Time  8    Period  Weeks    Status  New      PT LONG TERM GOAL #4   Title  Pt will improve SOT composite score to 70 in order to indicate decreased fall risk.     Time  8    Period  Weeks    Status  New            Plan  - 07/03/17 1016    Clinical Impression Statement  Skilled session focused on high level balance to elicit ankle and hip strategy.  Note posterior LOB more often during session, therefore addressed this as able.  Also continue to work on improved gait mechanics.  Pt making progress but continues to have R hip pain.  Recommend he follow up with ortho MD if pain continues.  Pt verbalized understanding.     Rehab Potential  Good    Clinical Impairments Affecting Rehab Potential  co-morbidities    PT Frequency  2x / week    PT Duration  8 weeks    PT Treatment/Interventions  ADLs/Self Care Home Management;DME Instruction;Gait training;Stair training;Functional mobility training;Therapeutic activities;Therapeutic exercise;Balance training;Neuromuscular re-education;Patient/family education;Orthotic Fit/Training;Passive range of motion;Vestibular    PT Next Visit Plan  BP PRN (DBP should remain above 42 (per pt report) ; Postural exercises and improved gait quality with stride length, posture and heel to toe contact.     Consulted and Agree with Plan of Care  Patient       Patient will  benefit from skilled therapeutic intervention in order to improve the following deficits and impairments:  Decreased balance, Decreased mobility, Decreased range of motion, Decreased strength, Abnormal gait, Impaired flexibility, Impaired perceived functional ability, Impaired sensation, Improper body mechanics, Postural dysfunction  Visit Diagnosis: Unsteadiness on feet  Muscle weakness (generalized)  Abnormal posture  Other abnormalities of gait and mobility     Problem List Patient Active Problem List   Diagnosis Date Noted  . PSVT (paroxysmal supraventricular tachycardia) ([place]) 09/12/2015  . Chest pain 05/03/2015  . Dyspnea 05/03/2015  . ALLERGIC RHINITIS 09/21/2007  . G E R D 09/21/2007  . SMOKE INHALATION 09/21/2007  . OSTEOPOROSIS 01/28/2007  . PALPITATIONS 01/28/2007  . COUGH 01/28/2007  .  ALLERGY 01/28/2007    Cameron Sprang, PT, MPT [facility] [address] [place], [place], [address] Phone: [phone]   Fax:  [phone] 07/03/17, 10:18 AM  Name: TRAY KLAYMAN MRN: [id number] Date of Birth: 04/12/1940

## 2017-07-06 ENCOUNTER — Ambulatory Visit: Payer: BLUE CROSS/BLUE SHIELD | Admitting: Physical Therapy

## 2017-07-06 ENCOUNTER — Encounter: Payer: Self-pay | Admitting: Physical Therapy

## 2017-07-06 DIAGNOSIS — R293 Abnormal posture: Secondary | ICD-10-CM | POA: Diagnosis not present

## 2017-07-06 DIAGNOSIS — R41841 Cognitive communication deficit: Secondary | ICD-10-CM | POA: Diagnosis not present

## 2017-07-06 DIAGNOSIS — M6281 Muscle weakness (generalized): Secondary | ICD-10-CM | POA: Diagnosis not present

## 2017-07-06 DIAGNOSIS — R2681 Unsteadiness on feet: Secondary | ICD-10-CM | POA: Diagnosis not present

## 2017-07-06 DIAGNOSIS — R2689 Other abnormalities of gait and mobility: Secondary | ICD-10-CM

## 2017-07-06 NOTE — Therapy (Signed)
[facility] [address] [place], [place], [address] Phone: [phone]   Fax:  [phone]  Physical Therapy Treatment  Patient Details  Name: Lee Nelson MRN: [id number] Date of Birth: 1940/06/17 Referring Provider: Andrey Spearman, MD   Encounter Date: 07/06/2017  PT End of Session - 07/06/17 2126    Visit Number  11    Number of Visits  17    Date for PT Re-Evaluation  07/24/17    Authorization Type  BCBS    PT Start Time  0932    PT Stop Time  1016    PT Time Calculation (min)  44 min    Equipment Utilized During Treatment  Gait belt       Past Medical History:  Diagnosis Date  . Anxiety   . Arthritis   . Bladder calculus   . BPH (benign prostatic hyperplasia)   . Chronic constipation   . Complication of anesthesia    " I had some coughing afterwards for a couple of days"--  per pt "perfers spinal anesthesia since general anesthesia congnitive issues when older"  . Diverticulosis of colon   . Dry eye syndrome of both eyes   . Environmental allergies   . GERD (gastroesophageal reflux disease)    occasional  . History of adenomatous polyp of colon    08/ 2004  . History of kidney stones   . History of squamous cell carcinoma in situ (SCCIS) of skin    s/p  excision 2013 facial areas and 06/ 2016 nose  . Migraine    eye migraine occasional  . Seasonal and perennial allergic rhinitis   . Thrombocytopenia ([place])   . Tingling    hands and feet bilat , intermittantly-- per pt has lumbar bulging disk  . Urinary frequency   . Vocal fold atrophy    dysphonia-- per pt has to drink large amount of water to take even on pill  . Wears glasses     Past Surgical History:  Procedure Laterality Date  . APPENDECTOMY  child  . CARDIOVASCULAR STRESS TEST  05-17-2015  dr hilty   Low risk nuclear study w/ no ischemia/  normal LV function and wall motion , stress ef 54% (lvef 45-54%)  . CHOLECYSTECTOMY N/A  09/29/2014   Procedure: LAPAROSCOPIC CHOLECYSTECTOMY WITH INTRAOPERATIVE CHOLANGIOGRAM;  Surgeon: Alphonsa Overall, MD;  Location: [facility];  Service: General;  Laterality: N/A;  . COLONOSCOPY  last one 09-06-2010  . ESOPHAGOGASTRODUODENOSCOPY N/A 12/09/2013   Procedure: ESOPHAGOGASTRODUODENOSCOPY (EGD);  Surgeon: Arta Silence, MD;  Location: [facility] END[facility]COPY;  Service: Endoscopy;  Laterality: N/A;  . EXTRACORPOREAL SHOCK WAVE LITHOTRIPSY  yrs ago  . INGUINAL HERNIA REPAIR Left child  . NISSEN FUNDOPLICATION  6606'T   open  . STONE EXTRACTION WITH BASKET N/A 08/18/2016   Procedure: STONE EXTRACTION WITH BASKET;  Surgeon: Carolan Clines, MD;  Location: [facility];  Service: Urology;  Laterality: N/A;  . THULIUM LASER TURP (TRANSURETHRAL RESECTION OF PROSTATE) N/A 08/18/2016   Procedure: THULIUM LASER BLADDER NECK INCISION AND BLADDER STONE REMOVAL;  Surgeon: Carolan Clines, MD;  Location: [facility];  Service: Urology;  Laterality: N/A;  . TONSILLECTOMY  child  . TRANSTHORACIC ECHOCARDIOGRAM  11/18/2010   grade 1 diastolic dysfunction, ef 01-60%/  trivial MR and TR/ mild dilated RA    There were no vitals filed for this visit.  Subjective Assessment - 07/06/17 2111    Subjective  Pt states Rt hip pain  is moderate today - says a little bit more than that in previous session last week but not alot more    Pertinent History  memory loss, osteoperosis, degenerative disc disease in cervical spine    Patient Stated Goals  "To get rid of the hip pain and figure out whats going on with my balance."                        [facility] Adult PT Treatment/Exercise - 07/06/17 0949      Ambulation/Gait   Ambulation/Gait  Yes    Ambulation/Gait Assistance  5: Supervision    Ambulation/Gait Assistance Details  green noodle was placed behind pt's back to facilitate upright posture ; ball head overhead to faciliate erect posture     Ambulation Distance (Feet)  115  Feet 2 reps    Assistive device  None    Gait Pattern  Step-through pattern;Decreased stride length;Right flexed knee in stance;Left flexed knee in stance;Shuffle;Trunk flexed    Ambulation Surface  Level;Indoor      Exercises   Exercises  Shoulder;Lumbar      Knee/Hip Exercises: Standing   Knee Flexion  AROM;Both;1 set;10 reps      Shoulder Exercises: IT sales professional  1 rep;30 seconds "W" stretch for pec stretching    Wall Stretch - Flexion  1 rep;30 seconds "Y" stretch to faciliate upright posture          Balance Exercises - 07/06/17 2123      Balance Exercises: Standing   Standing Eyes Opened  Wide (BOA);Foam/compliant surface;5 reps;Other (comment) on incline and on decline    Rockerboard  Anterior/posterior;Lateral;EC;EO;30 seconds;Other (comment);Intermittent UE support;10 reps;UE support    Retro Gait  3 reps forward/backward on blue mat on incline/decline    Other Standing Exercises  Pt stood on bosu inside // bars with bil. UE support - performed anterior/posterior weight shifts;         Pt performed marching with EO on incline and on decline with min to CGA:  Pt performed alternate stepping up/back and down/back  With CGA - standing on blue mat for compliant surface training Marching on decline - on blue mat - with CGA to min assist Pt performed tap ups to 1st and then to 2nd step - 10 reps each leg   PT Education - 07/06/17 2126    Education provided  Yes    Education Details  added corner and wall stretches to HEP to promote upright posture    Person(s) Educated  Patient    Methods  Explanation;Demonstration;Handout    Comprehension  Verbalized understanding;Returned demonstration       PT Short Term Goals - 06/22/17 1248      PT SHORT TERM GOAL #1   Title  Pt will initiate HEP in order to indicate decreased fall risk and improved functional mobility.  (Target Date: 06/24/17)    Baseline  06/22/17: Pt verbalized performance of HEP in order to  decrease fall risk and improve functional mobility.     Status  Achieved    Target Date  06/24/17      PT SHORT TERM GOAL #2   Title  Pt will improve FGA to 25/30 in order to indicate decreased fall risk.      Baseline  06/22/2017: 24/30 FGA today.     Period  Weeks    Status  Partially Met      PT SHORT TERM GOAL #3  Title  Pt will ambulate with gait speed >/=3.58 ft/sec in order to indicate more normal gait speed.      Baseline  06/22/2016: Gait speed scored 3.59 ft/sec indicating more normal gait speed.    Period  Weeks    Status  Achieved      PT SHORT TERM GOAL #4   Title  Pt will ambulate up to 500' at independent level with improved posture and decreased shuffled gait in order to indicate decreased fall risk.      Baseline  Pt ambulated 500' feet at independent level with improved posture and decreased shuffled gait to indicate decreased fall risk.     Period  Weeks    Status  Achieved        PT Long Term Goals - 06/22/17 1714      PT LONG TERM GOAL #1   Title  Pt will be independent with HEP in order to indicate decreased fall risk and improved functional mobility.  (Target Date: 07/24/17)    Time  8    Period  Weeks    Status  New      PT LONG TERM GOAL #2   Title  Pt will improve FGA to 28/30 in order to indicate decreased fall risk.      Time  8    Period  Weeks    Status  New      PT LONG TERM GOAL #3   Title  Pt will ambulate >1000' independently over varying outdoor surfaces (including curb step and grass) in order to indicate return to community and leisure activity.     Time  8    Period  Weeks    Status  New      PT LONG TERM GOAL #4   Title  Pt will improve SOT composite score to 70 in order to indicate decreased fall risk.     Time  8    Period  Weeks    Status  New            Plan - 07/06/17 2127    Clinical Impression Statement  Pt amb. with decreased step length with decreased heel contact at initial stance and with forward flexed  posture.  Pt has more difficulty with posterior weight shifting on rockerboard and on Bosu than with anterior weigh shift - pt reported some anxiety/fear of falling with standing on Bosu which may be contributing to decreased posterior weigh shift.     Rehab Potential  Good    Clinical Impairments Affecting Rehab Potential  co-morbidities    PT Frequency  2x / week    PT Duration  8 weeks    PT Treatment/Interventions  ADLs/Self Care Home Management;DME Instruction;Gait training;Stair training;Functional mobility training;Therapeutic activities;Therapeutic exercise;Balance training;Neuromuscular re-education;Patient/family education;Orthotic Fit/Training;Passive range of motion;Vestibular    PT Next Visit Plan  BP PRN (DBP should remain above 42 (per pt report) ; Postural exercises and improved gait quality with stride length, posture and heel to toe contact.     PT Home Exercise Plan  added stretches for improved posture to HEP    Consulted and Agree with Plan of Care  Patient       Patient will benefit from skilled therapeutic intervention in order to improve the following deficits and impairments:  Decreased balance, Decreased mobility, Decreased range of motion, Decreased strength, Abnormal gait, Impaired flexibility, Impaired perceived functional ability, Impaired sensation, Improper body mechanics, Postural dysfunction  Visit Diagnosis: Abnormal posture  Other abnormalities  of gait and mobility  Unsteadiness on feet     Problem List Patient Active Problem List   Diagnosis Date Noted  . PSVT (paroxysmal supraventricular tachycardia) ([place]) 09/12/2015  . Chest pain 05/03/2015  . Dyspnea 05/03/2015  . ALLERGIC RHINITIS 09/21/2007  . G E R D 09/21/2007  . SMOKE INHALATION 09/21/2007  . OSTEOPOROSIS 01/28/2007  . PALPITATIONS 01/28/2007  . COUGH 01/28/2007  . ALLERGY 01/28/2007    Alda Lea, PT 07/06/2017, 9:34 PM  [facility] [address] [place], [place], [address] Phone: [phone]   Fax:  [phone]  Name: Lee Nelson MRN: [id number] Date of Birth: 1941-01-25

## 2017-07-06 NOTE — Patient Instructions (Signed)
Flexors Stretch, Standing    Stand near wall and slide arm up, with palm facing away from wall, by leaning toward wall. Hold _30__ seconds.  Repeat _1__ times per session. Do 1-2___ sessions per day.  Can lift both arms together      Flexibility: Corner Stretch    Standing in corner with hands just above shoulder level and feet _10___ inches from corner, lean forward until a comfortable stretch is felt across chest. Hold _30_ seconds. Repeat __1__ times per set. Do _1___ sets per session. Do _1-2___ sessions per day.  http://orth.exer.us/342   Copyright  VHI. All rights reserved.

## 2017-07-09 DIAGNOSIS — Z86010 Personal history of colonic polyps: Secondary | ICD-10-CM | POA: Diagnosis not present

## 2017-07-09 DIAGNOSIS — R1909 Other intra-abdominal and pelvic swelling, mass and lump: Secondary | ICD-10-CM | POA: Diagnosis not present

## 2017-07-10 ENCOUNTER — Encounter: Payer: Self-pay | Admitting: Rehabilitation

## 2017-07-10 ENCOUNTER — Ambulatory Visit: Payer: BLUE CROSS/BLUE SHIELD | Admitting: Rehabilitation

## 2017-07-10 DIAGNOSIS — R293 Abnormal posture: Secondary | ICD-10-CM

## 2017-07-10 DIAGNOSIS — M6281 Muscle weakness (generalized): Secondary | ICD-10-CM | POA: Diagnosis not present

## 2017-07-10 DIAGNOSIS — R2681 Unsteadiness on feet: Secondary | ICD-10-CM | POA: Diagnosis not present

## 2017-07-10 DIAGNOSIS — R2689 Other abnormalities of gait and mobility: Secondary | ICD-10-CM

## 2017-07-10 DIAGNOSIS — R41841 Cognitive communication deficit: Secondary | ICD-10-CM | POA: Diagnosis not present

## 2017-07-10 NOTE — Therapy (Signed)
[facility] [address] [place], [place], [address] Phone: [phone]   Fax:  [phone]  Physical Therapy Treatment  Patient Details  Name: Lee Nelson MRN: [id number] Date of Birth: [birth year] Referring Provider: Andrey Spearman, MD   Encounter Date: 07/10/2017  PT End of Session - 07/10/17 0938    Visit Number  12    Number of Visits  17    Date for PT Re-Evaluation  07/24/17    Authorization Type  BCBS    PT Start Time  0933    PT Stop Time  1018    PT Time Calculation (min)  45 min    Equipment Utilized During Treatment  Gait belt       Past Medical History:  Diagnosis Date  . Anxiety   . Arthritis   . Bladder calculus   . BPH (benign prostatic hyperplasia)   . Chronic constipation   . Complication of anesthesia    " I had some coughing afterwards for a couple of days"--  per pt "perfers spinal anesthesia since general anesthesia congnitive issues when older"  . Diverticulosis of colon   . Dry eye syndrome of both eyes   . Environmental allergies   . GERD (gastroesophageal reflux disease)    occasional  . History of adenomatous polyp of colon    08/ 2004  . History of kidney stones   . History of squamous cell carcinoma in situ (SCCIS) of skin    s/p  excision 2013 facial areas and 06/ 2016 nose  . Migraine    eye migraine occasional  . Seasonal and perennial allergic rhinitis   . Thrombocytopenia ([place])   . Tingling    hands and feet bilat , intermittantly-- per pt has lumbar bulging disk  . Urinary frequency   . Vocal fold atrophy    dysphonia-- per pt has to drink large amount of water to take even on pill  . Wears glasses     Past Surgical History:  Procedure Laterality Date  . APPENDECTOMY  child  . CARDIOVASCULAR STRESS TEST  05-17-2015  dr hilty   Low risk nuclear study w/ no ischemia/  normal LV function and wall motion , stress ef 54% (lvef 45-54%)  . CHOLECYSTECTOMY N/A  09/29/2014   Procedure: LAPAROSCOPIC CHOLECYSTECTOMY WITH INTRAOPERATIVE CHOLANGIOGRAM;  Surgeon: Alphonsa Overall, MD;  Location: [facility];  Service: General;  Laterality: N/A;  . COLONOSCOPY  last one 09-06-2010  . ESOPHAGOGASTRODUODENOSCOPY N/A 12/09/2013   Procedure: ESOPHAGOGASTRODUODENOSCOPY (EGD);  Surgeon: Arta Silence, MD;  [facility];  Service: Endoscopy;  Laterality: N/A;  . EXTRACORPOREAL SHOCK WAVE LITHOTRIPSY  yrs ago  . INGUINAL HERNIA REPAIR Left child  . NISSEN FUNDOPLICATION  6962'X   open  . STONE EXTRACTION WITH BASKET N/A 08/18/2016   Procedure: STONE EXTRACTION WITH BASKET;  Surgeon: Carolan Clines, MD;  Location: [facility];  Service: Urology;  Laterality: N/A;  . THULIUM LASER TURP (TRANSURETHRAL RESECTION OF PROSTATE) N/A 08/18/2016   Procedure: THULIUM LASER BLADDER NECK INCISION AND BLADDER STONE REMOVAL;  Surgeon: Carolan Clines, MD;  Location: [facility];  Service: Urology;  Laterality: N/A;  . TONSILLECTOMY  child  . TRANSTHORACIC ECHOCARDIOGRAM  11/18/2010   grade 1 diastolic dysfunction, ef 52-84%/  trivial MR and TR/ mild dilated RA    There were no vitals filed for this visit.  Subjective Assessment - 07/10/17 0934    Subjective  Reports continued pain in R  hip, had some last and woke up  but also reports it was cold in home. Reports wanting to come one time per week for these last visits.     Pertinent History  memory loss, osteoperosis, degenerative disc disease in cervical spine    Limitations  House hold activities;Walking    Patient Stated Goals  "To get rid of the hip pain and figure out whats going on with my balance."     Currently in Pain?  Yes    Pain Score  2     Pain Location  Hip    Pain Orientation  Right    Pain Descriptors / Indicators  Aching    Pain Type  Chronic pain    Pain Onset  More than a month ago    Pain Frequency  Intermittent    Aggravating Factors   during the night, being  still    Pain Relieving Factors  tylenol and movement.                        [facility] Adult PT Treatment/Exercise - 07/10/17 0941      Neuro Re-ed    Neuro Re-ed Details   Continue to work on balance activities for SLS, narrow BOS, and exercises to elicit ankle and hip strategy.  In // bars performed tandem stance x 20 secs (2 sets on each side) progressing to tandem walking in // bars with arms crossed x 4 reps down and back, tandem stance on foam balance beam x 15 secs each direction (2 sets), tandem walking on foam balance beam (arms at side) x 4 reps.  Step ups with stance leg on first 6" step tapping opposite leg to top step with BUE support>single UE support >no UE support x 10 reps on each side.  Wall bumps x 5 reps with feet apart on solid ground (approx 4" from wall) with 5 sec holds with cues for posture progressing to feet together x 5 reps with 5 sec holds.  Continued on foam balance beam with feet apart x 5 reps with 5 sec holds>feet together x 5 reps with 5 sec holds.  Braiding at counter top x 1 rep, however pt with increased R hip pain, therefore did not continue with task.        Exercises   Exercises  Other Exercises    Other Exercises   Supine hip flex stretch off edge of mat x 2 mins on each side.   Supine hamstring stretch x 1 min on each side (with strap).  Side stepping with red band around knees x 4 laps, side stepping while maintaining mini squat x 4 reps with red band.               PT Education - 07/10/17 [phone]    Education provided  Yes    Education Details  importance of stretches at home    Person(s) Educated  Patient    Methods  Explanation    Comprehension  Verbalized understanding       PT Short Term Goals - 06/22/17 1248      PT SHORT TERM GOAL #1   Title  Pt will initiate HEP in order to indicate decreased fall risk and improved functional mobility.  (Target Date: 06/24/17)    Baseline  06/22/17: Pt verbalized performance of HEP in order  to decrease fall risk and improve functional mobility.     Status  Achieved    Target  Date  06/24/17      PT SHORT TERM GOAL #2   Title  Pt will improve FGA to 25/30 in order to indicate decreased fall risk.      Baseline  06/22/2017: 24/30 FGA today.     Period  Weeks    Status  Partially Met      PT SHORT TERM GOAL #3   Title  Pt will ambulate with gait speed >/=3.58 ft/sec in order to indicate more normal gait speed.      Baseline  06/22/2016: Gait speed scored 3.59 ft/sec indicating more normal gait speed.    Period  Weeks    Status  Achieved      PT SHORT TERM GOAL #4   Title  Pt will ambulate up to 500' at independent level with improved posture and decreased shuffled gait in order to indicate decreased fall risk.      Baseline  Pt ambulated 500' feet at independent level with improved posture and decreased shuffled gait to indicate decreased fall risk.     Period  Weeks    Status  Achieved        PT Long Term Goals - 06/22/17 1714      PT LONG TERM GOAL #1   Title  Pt will be independent with HEP in order to indicate decreased fall risk and improved functional mobility.  (Target Date: 07/24/17)    Time  8    Period  Weeks    Status  New      PT LONG TERM GOAL #2   Title  Pt will improve FGA to 28/30 in order to indicate decreased fall risk.      Time  8    Period  Weeks    Status  New      PT LONG TERM GOAL #3   Title  Pt will ambulate >1000' independently over varying outdoor surfaces (including curb step and grass) in order to indicate return to community and leisure activity.     Time  8    Period  Weeks    Status  New      PT LONG TERM GOAL #4   Title  Pt will improve SOT composite score to 70 in order to indicate decreased fall risk.     Time  8    Period  Weeks    Status  New            Plan - 07/10/17 1026    Clinical Impression Statement  Session continues to focus on balance exercises with emphasis on SLS, narrowed BOS, and ellicitation of ankle  and hip strategy along with B hip strengthening.  Pt continues to have increased R hip pain with some movements therefore continue to recommend he follow up with ortho MD and he also notes inguinal hernia in this region causing pain that he is planning to address.     Rehab Potential  Good    Clinical Impairments Affecting Rehab Potential  co-morbidities    PT Frequency  2x / week    PT Duration  8 weeks    PT Treatment/Interventions  ADLs/Self Care Home Management;DME Instruction;Gait training;Stair training;Functional mobility training;Therapeutic activities;Therapeutic exercise;Balance training;Neuromuscular re-education;Patient/family education;Orthotic Fit/Training;Passive range of motion;Vestibular    PT Next Visit Plan  BP PRN (DBP should remain above 42 (per pt report) ; Postural exercises and improved gait quality with stride length, posture and heel to toe contact.     PT Home Exercise Plan  added  stretches for improved posture to HEP    Consulted and Agree with Plan of Care  Patient       Patient will benefit from skilled therapeutic intervention in order to improve the following deficits and impairments:  Decreased balance, Decreased mobility, Decreased range of motion, Decreased strength, Abnormal gait, Impaired flexibility, Impaired perceived functional ability, Impaired sensation, Improper body mechanics, Postural dysfunction  Visit Diagnosis: Abnormal posture  Other abnormalities of gait and mobility  Unsteadiness on feet  Muscle weakness (generalized)     Problem List Patient Active Problem List   Diagnosis Date Noted  . PSVT (paroxysmal supraventricular tachycardia) ([place]) 09/12/2015  . Chest pain 05/03/2015  . Dyspnea 05/03/2015  . ALLERGIC RHINITIS 09/21/2007  . G E R D 09/21/2007  . SMOKE INHALATION 09/21/2007  . OSTEOPOROSIS 01/28/2007  . PALPITATIONS 01/28/2007  . COUGH 01/28/2007  . ALLERGY 01/28/2007    Cameron Sprang, PT, MPT [facility] [address] [place], [place], [address] Phone: [phone]   Fax:  [phone] 07/10/17, 10:29 AM  Name: Lee Nelson MRN: [id number] Date of Birth: Aug 31, 1940

## 2017-07-13 ENCOUNTER — Encounter: Payer: Self-pay | Admitting: Rehabilitation

## 2017-07-13 ENCOUNTER — Ambulatory Visit: Payer: BLUE CROSS/BLUE SHIELD | Admitting: Rehabilitation

## 2017-07-13 DIAGNOSIS — R293 Abnormal posture: Secondary | ICD-10-CM

## 2017-07-13 DIAGNOSIS — M6281 Muscle weakness (generalized): Secondary | ICD-10-CM | POA: Diagnosis not present

## 2017-07-13 DIAGNOSIS — R2689 Other abnormalities of gait and mobility: Secondary | ICD-10-CM | POA: Diagnosis not present

## 2017-07-13 DIAGNOSIS — R41841 Cognitive communication deficit: Secondary | ICD-10-CM | POA: Diagnosis not present

## 2017-07-13 DIAGNOSIS — R2681 Unsteadiness on feet: Secondary | ICD-10-CM

## 2017-07-13 NOTE — Therapy (Signed)
[facility] [address] [place], [place], [address] Phone: [phone]   Fax:  [phone]  Physical Therapy Treatment  Patient Details  Name: Lee Nelson MRN: [id number] Date of Birth: [birth year] Referring Provider: Andrey Spearman, MD   Encounter Date: 07/13/2017  PT End of Session - 07/13/17 1100    Visit Number  13    Number of Visits  17    Date for PT Re-Evaluation  07/24/17    Authorization Type  BCBS    PT Start Time  0935    PT Stop Time  1015    PT Time Calculation (min)  40 min    Equipment Utilized During Treatment  Gait belt       Past Medical History:  Diagnosis Date  . Anxiety   . Arthritis   . Bladder calculus   . BPH (benign prostatic hyperplasia)   . Chronic constipation   . Complication of anesthesia    " I had some coughing afterwards for a couple of days"--  per pt "perfers spinal anesthesia since general anesthesia congnitive issues when older"  . Diverticulosis of colon   . Dry eye syndrome of both eyes   . Environmental allergies   . GERD (gastroesophageal reflux disease)    occasional  . History of adenomatous polyp of colon    08/ 2004  . History of kidney stones   . History of squamous cell carcinoma in situ (SCCIS) of skin    s/p  excision 2013 facial areas and 06/ 2016 nose  . Migraine    eye migraine occasional  . Seasonal and perennial allergic rhinitis   . Thrombocytopenia ([place])   . Tingling    hands and feet bilat , intermittantly-- per pt has lumbar bulging disk  . Urinary frequency   . Vocal fold atrophy    dysphonia-- per pt has to drink large amount of water to take even on pill  . Wears glasses     Past Surgical History:  Procedure Laterality Date  . APPENDECTOMY  child  . CARDIOVASCULAR STRESS TEST  05-17-2015  dr hilty   Low risk nuclear study w/ no ischemia/  normal LV function and wall motion , stress ef 54% (lvef 45-54%)  . CHOLECYSTECTOMY N/A  09/29/2014   Procedure: LAPAROSCOPIC CHOLECYSTECTOMY WITH INTRAOPERATIVE CHOLANGIOGRAM;  Surgeon: Alphonsa Overall, MD;  Location: [facility];  Service: General;  Laterality: N/A;  . COLONOSCOPY  last one 09-06-2010  . ESOPHAGOGASTRODUODENOSCOPY N/A 12/09/2013   Procedure: ESOPHAGOGASTRODUODENOSCOPY (EGD);  Surgeon: Arta Silence, MD;  Location: [facility] ENDOSCOPY;  Service: Endoscopy;  Laterality: N/A;  . EXTRACORPOREAL SHOCK WAVE LITHOTRIPSY  yrs ago  . INGUINAL HERNIA REPAIR Left child  . NISSEN FUNDOPLICATION  8182'X   open  . STONE EXTRACTION WITH BASKET N/A 08/18/2016   Procedure: STONE EXTRACTION WITH BASKET;  Surgeon: Carolan Clines, MD;  Location: [facility];  Service: Urology;  Laterality: N/A;  . THULIUM LASER TURP (TRANSURETHRAL RESECTION OF PROSTATE) N/A 08/18/2016   Procedure: THULIUM LASER BLADDER NECK INCISION AND BLADDER STONE REMOVAL;  Surgeon: Carolan Clines, MD;  Location: [facility];  Service: Urology;  Laterality: N/A;  . TONSILLECTOMY  child  . TRANSTHORACIC ECHOCARDIOGRAM  11/18/2010   grade 1 diastolic dysfunction, ef 93-71%/  trivial MR and TR/ mild dilated RA    There were no vitals filed for this visit.  Subjective Assessment - 07/13/17 0937    Subjective  Reports increased R knee pain  over the weekend, still has R hip pain.      Pertinent History  memory loss, osteoperosis, degenerative disc disease in cervical spine    Limitations  House hold activities;Walking    Patient Stated Goals  "To get rid of the hip pain and figure out whats going on with my balance."     Currently in Pain?  Yes    Pain Score  2     Pain Location  Back    Pain Orientation  Right    Pain Descriptors / Indicators  Aching    Pain Type  Chronic pain    Pain Onset  More than a month ago    Pain Frequency  Intermittent    Aggravating Factors   during the night    Pain Relieving Factors  movement, tylenol    Multiple Pain Sites  Yes    Pain Score  3    Pain  Location  Foot    Pain Orientation  Right;Left    Pain Descriptors / Indicators  Aching    Pain Type  Acute pain    Pain Onset  More than a month ago worse recently    Pain Frequency  Constant    Aggravating Factors   weight bearing, certain shoes    Pain Relieving Factors  changing shoes, rest.                        [facility] Adult PT Treatment/Exercise - 07/13/17 0001      Ambulation/Gait   Ambulation/Gait  Yes    Ambulation/Gait Assistance  5: Supervision    Ambulation/Gait Assistance Details  Ended session with gait x 230' with cues from PT for improved posture, increased arm swing, larger stride length and improved heel contact.  Note that he is wearing sandals today which made adequate heel strike more difficult.      Ambulation Distance (Feet)  230 Feet    Assistive device  None    Gait Pattern  Step-through pattern;Decreased stride length;Right flexed knee in stance;Left flexed knee in stance;Shuffle;Trunk flexed    Ambulation Surface  Level;Indoor      Neuro Re-ed    Neuro Re-ed Details   Corner balance on foam airex pad; feet apart EO x 30 secs>feet apart EC x 20 secs (2 sets), feet together EO x 20 secs, feet together EC x 2 sets of 20 secs.  Counter top balance marching slowly in place with intermittent support to no support x 10 reps on each side.  Cone tapping alternating LEs x 6 reps on blue therapy mat x 2 sets, Lateral stepping over orange barrier x 10 reps on each side with cues for larger step length.  Lateral stepping as in PWR! moves x 10 reps with lateral step and open arm x 10 reps on each side with cues for technique and improved posture throughout.        Exercises   Exercises  Other Exercises    Other Exercises   Pt continues to need to go over some LE stretches as he was doing IT band stretch incorrectly.  Performed L SL RLE IT band stretch with max cues for hip position.  This seemed difficult to maintain, therefore had him perform in supine with  strap which he seemed to do better with.  Performed x 45 secs and also performed B supine hamstring stretch x 1 min on each side.  PT Education - 07/13/17 1100    Education provided  Yes    Education Details  technique with stretching    Person(s) Educated  Patient    Methods  Explanation;Demonstration    Comprehension  Verbalized understanding;Returned demonstration       PT Short Term Goals - 06/22/17 1248      PT SHORT TERM GOAL #1   Title  Pt will initiate HEP in order to indicate decreased fall risk and improved functional mobility.  (Target Date: 06/24/17)    Baseline  06/22/17: Pt verbalized performance of HEP in order to decrease fall risk and improve functional mobility.     Status  Achieved    Target Date  06/24/17      PT SHORT TERM GOAL #2   Title  Pt will improve FGA to 25/30 in order to indicate decreased fall risk.      Baseline  06/22/2017: 24/30 FGA today.     Period  Weeks    Status  Partially Met      PT SHORT TERM GOAL #3   Title  Pt will ambulate with gait speed >/=3.58 ft/sec in order to indicate more normal gait speed.      Baseline  06/22/2016: Gait speed scored 3.59 ft/sec indicating more normal gait speed.    Period  Weeks    Status  Achieved      PT SHORT TERM GOAL #4   Title  Pt will ambulate up to 500' at independent level with improved posture and decreased shuffled gait in order to indicate decreased fall risk.      Baseline  Pt ambulated 500' feet at independent level with improved posture and decreased shuffled gait to indicate decreased fall risk.     Period  Weeks    Status  Achieved        PT Long Term Goals - 06/22/17 1714      PT LONG TERM GOAL #1   Title  Pt will be independent with HEP in order to indicate decreased fall risk and improved functional mobility.  (Target Date: 07/24/17)    Time  8    Period  Weeks    Status  New      PT LONG TERM GOAL #2   Title  Pt will improve FGA to 28/30 in order to indicate  decreased fall risk.      Time  8    Period  Weeks    Status  New      PT LONG TERM GOAL #3   Title  Pt will ambulate >1000' independently over varying outdoor surfaces (including curb step and grass) in order to indicate return to community and leisure activity.     Time  8    Period  Weeks    Status  New      PT LONG TERM GOAL #4   Title  Pt will improve SOT composite score to 70 in order to indicate decreased fall risk.     Time  8    Period  Weeks    Status  New            Plan - 07/13/17 1101    Clinical Impression Statement  Skilled session continues to focus on BLE flexibility and balance with emphasis on SLS, compliant surface and ankle/hip strategy.  Pt improving with increased activation of hip and ankle strategy within session.     Rehab Potential  Good    Clinical Impairments Affecting Rehab  Potential  co-morbidities    PT Frequency  2x / week    PT Duration  8 weeks    PT Treatment/Interventions  ADLs/Self Care Home Management;DME Instruction;Gait training;Stair training;Functional mobility training;Therapeutic activities;Therapeutic exercise;Balance training;Neuromuscular re-education;Patient/family education;Orthotic Fit/Training;Passive range of motion;Vestibular    PT Next Visit Plan  BP PRN (DBP should remain above 42 (per pt report) ; Postural exercises and improved gait quality with stride length, posture and heel to toe contact.     PT Home Exercise Plan  added stretches for improved posture to HEP    Consulted and Agree with Plan of Care  Patient       Patient will benefit from skilled therapeutic intervention in order to improve the following deficits and impairments:  Decreased balance, Decreased mobility, Decreased range of motion, Decreased strength, Abnormal gait, Impaired flexibility, Impaired perceived functional ability, Impaired sensation, Improper body mechanics, Postural dysfunction  Visit Diagnosis: Abnormal posture  Other abnormalities of  gait and mobility  Unsteadiness on feet  Muscle weakness (generalized)     Problem List Patient Active Problem List   Diagnosis Date Noted  . PSVT (paroxysmal supraventricular tachycardia) ([place]) 09/12/2015  . Chest pain 05/03/2015  . Dyspnea 05/03/2015  . ALLERGIC RHINITIS 09/21/2007  . G E R D 09/21/2007  . SMOKE INHALATION 09/21/2007  . OSTEOPOROSIS 01/28/2007  . PALPITATIONS 01/28/2007  . COUGH 01/28/2007  . ALLERGY 01/28/2007    Cameron Sprang, PT, MPT [facility] [address] [place], [place], [address] Phone: [phone]   Fax:  [phone] 07/13/17, 12:40 PM  Name: KHASIR WOODROME MRN: [id number] Date of Birth: 1940-06-23

## 2017-07-16 ENCOUNTER — Encounter: Payer: Self-pay | Admitting: Rehabilitation

## 2017-07-16 ENCOUNTER — Ambulatory Visit: Payer: BLUE CROSS/BLUE SHIELD | Admitting: Rehabilitation

## 2017-07-16 DIAGNOSIS — R2689 Other abnormalities of gait and mobility: Secondary | ICD-10-CM

## 2017-07-16 DIAGNOSIS — R293 Abnormal posture: Secondary | ICD-10-CM

## 2017-07-16 DIAGNOSIS — R2681 Unsteadiness on feet: Secondary | ICD-10-CM | POA: Diagnosis not present

## 2017-07-16 DIAGNOSIS — R41841 Cognitive communication deficit: Secondary | ICD-10-CM | POA: Diagnosis not present

## 2017-07-16 DIAGNOSIS — M6281 Muscle weakness (generalized): Secondary | ICD-10-CM | POA: Diagnosis not present

## 2017-07-16 NOTE — Therapy (Signed)
[facility] [address] [place], [place], [address] Phone: [phone]   Fax:  [phone]  Physical Therapy Treatment  Patient Details  Name: Lee Nelson MRN: [id number] Date of Birth: [birth year] Referring Provider: Andrey Spearman, MD   Encounter Date: 07/16/2017  PT End of Session - 07/16/17 1621    Visit Number  14    Number of Visits  17    Date for PT Re-Evaluation  07/24/17    Authorization Type  BCBS    PT Start Time  1616    PT Stop Time  1700    PT Time Calculation (min)  44 min    Equipment Utilized During Treatment  Gait belt       Past Medical History:  Diagnosis Date  . Anxiety   . Arthritis   . Bladder calculus   . BPH (benign prostatic hyperplasia)   . Chronic constipation   . Complication of anesthesia    " I had some coughing afterwards for a couple of days"--  per pt "perfers spinal anesthesia since general anesthesia congnitive issues when older"  . Diverticulosis of colon   . Dry eye syndrome of both eyes   . Environmental allergies   . GERD (gastroesophageal reflux disease)    occasional  . History of adenomatous polyp of colon    08/ 2004  . History of kidney stones   . History of squamous cell carcinoma in situ (SCCIS) of skin    s/p  excision 2013 facial areas and 06/ 2016 nose  . Migraine    eye migraine occasional  . Seasonal and perennial allergic rhinitis   . Thrombocytopenia ([place])   . Tingling    hands and feet bilat , intermittantly-- per pt has lumbar bulging disk  . Urinary frequency   . Vocal fold atrophy    dysphonia-- per pt has to drink large amount of water to take even on pill  . Wears glasses     Past Surgical History:  Procedure Laterality Date  . APPENDECTOMY  child  . CARDIOVASCULAR STRESS TEST  05-17-2015  dr hilty   Low risk nuclear study w/ no ischemia/  normal LV function and wall motion , stress ef 54% (lvef 45-54%)  . CHOLECYSTECTOMY N/A  09/29/2014   Procedure: LAPAROSCOPIC CHOLECYSTECTOMY WITH INTRAOPERATIVE CHOLANGIOGRAM;  Surgeon: Alphonsa Overall, MD;  Location: [facility];  Service: General;  Laterality: N/A;  . COLONOSCOPY  last one 09-06-2010  . ESOPHAGOGASTRODUODENOSCOPY N/A 12/09/2013   Procedure: ESOPHAGOGASTRODUODENOSCOPY (EGD);  Surgeon: Arta Silence, MD;  Location: [facility] ENDOSCOPY;  Service: Endoscopy;  Laterality: N/A;  . EXTRACORPOREAL SHOCK WAVE LITHOTRIPSY  yrs ago  . INGUINAL HERNIA REPAIR Left child  . NISSEN FUNDOPLICATION  7564'P   open  . STONE EXTRACTION WITH BASKET N/A 08/18/2016   Procedure: STONE EXTRACTION WITH BASKET;  Surgeon: Carolan Clines, MD;  Location: [facility];  Service: Urology;  Laterality: N/A;  . THULIUM LASER TURP (TRANSURETHRAL RESECTION OF PROSTATE) N/A 08/18/2016   Procedure: THULIUM LASER BLADDER NECK INCISION AND BLADDER STONE REMOVAL;  Surgeon: Carolan Clines, MD;  Location: [facility];  Service: Urology;  Laterality: N/A;  . TONSILLECTOMY  child  . TRANSTHORACIC ECHOCARDIOGRAM  11/18/2010   grade 1 diastolic dysfunction, ef 32-95%/  trivial MR and TR/ mild dilated RA    There were no vitals filed for this visit.  Subjective Assessment - 07/16/17 1619    Subjective  Pt reports no changes since  last visit.     Pertinent History  memory loss, osteoperosis, degenerative disc disease in cervical spine    Limitations  House hold activities;Walking    Patient Stated Goals  "To get rid of the hip pain and figure out whats going on with my balance."     Currently in Pain?  Yes    Pain Score  1     Pain Location  Back    Pain Orientation  Right    Pain Descriptors / Indicators  Aching    Pain Type  Chronic pain    Pain Radiating Towards  R low back to R lateral and anterior hip    Pain Onset  More than a month ago    Pain Frequency  Intermittent    Aggravating Factors   during the night    Pain Relieving Factors  movement, tylenol                         [facility] Adult PT Treatment/Exercise - 07/16/17 1641      Ambulation/Gait   Ambulation/Gait  Yes    Ambulation/Gait Assistance  5: Supervision    Ambulation/Gait Assistance Details  Continue to address gait for improved quality including increased stride length and gait speed.  Continues to require cues as he he tends to shorten step length as he increases gait speed.      Ambulation Distance (Feet)  300 Feet    Assistive device  None    Gait Pattern  Step-through pattern;Decreased stride length;Right flexed knee in stance;Left flexed knee in stance;Shuffle;Trunk flexed    Ambulation Surface  Level;Indoor      Neuro Re-ed    Neuro Re-ed Details   High level balance in // bars on small rocker board biased vertically maintaining balance x 2 sets of 20 secs>moving board forward/backward x 10 reps with intermittent support.  Biased horizontally maintaining balance x 2 sets of 20 secs>moving board side/side x 10 reps.  Standing on BOSU maintaining balance x 3 reps of 15 secs. Pt with strong posterior LOB (with rocker board but increased with BOSU).  Provided max education for improved forward weight shift with ankle and hip strategy.  Went over wall bump prior to returning to BOSU to increase carryover with hip strategy.   Transitioned to stance leg on BOSU (blue top) advancing and retro stepping opposite LE with light UE support.  Cues for increased knee extension on stance leg x 10 reps each.  Forwards/backwards step up/down on aerobic step in // bars with emphasis on large step and increased speed x 10 reps, lateral stepping in same manner x 10 reps again with cues for increased step width to ensure room for opposite foot.  Stepping to targets on floor and returning to midline with emphasis on large step and return to midline with single step only x 15 reps.  Ended session with cone taps, alternaitng LEs x 10 reps progressing to tapping 2 cones before return to midline x  10 reps.  Pt able to do most tasks with intermittent UE support and min/guard.               PT Education - 07/16/17 2054    Education provided  Yes    Education Details  working towards [place] and D/C next week    Person(s) Educated  Patient    Methods  Explanation    Comprehension  Verbalized understanding       PT  Short Term Goals - 06/22/17 1248      PT SHORT TERM GOAL #1   Title  Pt will initiate HEP in order to indicate decreased fall risk and improved functional mobility.  (Target Date: 06/24/17)    Baseline  06/22/17: Pt verbalized performance of HEP in order to decrease fall risk and improve functional mobility.     Status  Achieved    Target Date  06/24/17      PT SHORT TERM GOAL #2   Title  Pt will improve FGA to 25/30 in order to indicate decreased fall risk.      Baseline  06/22/2017: 24/30 FGA today.     Period  Weeks    Status  Partially Met      PT SHORT TERM GOAL #3   Title  Pt will ambulate with gait speed >/=3.58 ft/sec in order to indicate more normal gait speed.      Baseline  06/22/2016: Gait speed scored 3.59 ft/sec indicating more normal gait speed.    Period  Weeks    Status  Achieved      PT SHORT TERM GOAL #4   Title  Pt will ambulate up to 500' at independent level with improved posture and decreased shuffled gait in order to indicate decreased fall risk.      Baseline  Pt ambulated 500' feet at independent level with improved posture and decreased shuffled gait to indicate decreased fall risk.     Period  Weeks    Status  Achieved        PT Long Term Goals - 06/22/17 1714      PT LONG TERM GOAL #1   Title  Pt will be independent with HEP in order to indicate decreased fall risk and improved functional mobility.  (Target Date: 07/24/17)    Time  8    Period  Weeks    Status  New      PT LONG TERM GOAL #2   Title  Pt will improve FGA to 28/30 in order to indicate decreased fall risk.      Time  8    Period  Weeks    Status  New      PT  LONG TERM GOAL #3   Title  Pt will ambulate >1000' independently over varying outdoor surfaces (including curb step and grass) in order to indicate return to community and leisure activity.     Time  8    Period  Weeks    Status  New      PT LONG TERM GOAL #4   Title  Pt will improve SOT composite score to 70 in order to indicate decreased fall risk.     Time  8    Period  Weeks    Status  New            Plan - 07/16/17 2055    Clinical Impression Statement  Continue to focus on high level balance, NMR exercises for increased step length and SLS.  Pt making progress and expect him to be on track for D/C next week.     Rehab Potential  Good    Clinical Impairments Affecting Rehab Potential  co-morbidities    PT Frequency  2x / week    PT Duration  8 weeks    PT Treatment/Interventions  ADLs/Self Care Home Management;DME Instruction;Gait training;Stair training;Functional mobility training;Therapeutic activities;Therapeutic exercise;Balance training;Neuromuscular re-education;Patient/family education;Orthotic Fit/Training;Passive range of motion;Vestibular    PT Next Visit Plan  Do SOT, BP PRN (DBP should remain above 42 (per pt report) ; Postural exercises and improved gait quality with stride length, posture and heel to toe contact.     PT Home Exercise Plan  added stretches for improved posture to HEP    Consulted and Agree with Plan of Care  Patient       Patient will benefit from skilled therapeutic intervention in order to improve the following deficits and impairments:  Decreased balance, Decreased mobility, Decreased range of motion, Decreased strength, Abnormal gait, Impaired flexibility, Impaired perceived functional ability, Impaired sensation, Improper body mechanics, Postural dysfunction  Visit Diagnosis: Abnormal posture  Other abnormalities of gait and mobility  Unsteadiness on feet  Muscle weakness (generalized)     Problem List Patient Active Problem List    Diagnosis Date Noted  . PSVT (paroxysmal supraventricular tachycardia) ([place]) 09/12/2015  . Chest pain 05/03/2015  . Dyspnea 05/03/2015  . ALLERGIC RHINITIS 09/21/2007  . G E R D 09/21/2007  . SMOKE INHALATION 09/21/2007  . OSTEOPOROSIS 01/28/2007  . PALPITATIONS 01/28/2007  . COUGH 01/28/2007  . ALLERGY 01/28/2007    Cameron Sprang, PT, MPT [facility] [address] [place], [place], [address] Phone: [phone]   Fax:  [phone] 07/16/17, 8:57 PM  Name: Lee Nelson MRN: [id number] Date of Birth: 07-16-1940

## 2017-07-20 ENCOUNTER — Ambulatory Visit: Payer: BLUE CROSS/BLUE SHIELD | Admitting: Speech Pathology

## 2017-07-20 ENCOUNTER — Ambulatory Visit: Payer: BLUE CROSS/BLUE SHIELD | Admitting: Rehabilitation

## 2017-07-20 ENCOUNTER — Encounter: Payer: Self-pay | Admitting: Rehabilitation

## 2017-07-20 DIAGNOSIS — R293 Abnormal posture: Secondary | ICD-10-CM

## 2017-07-20 DIAGNOSIS — M6281 Muscle weakness (generalized): Secondary | ICD-10-CM

## 2017-07-20 DIAGNOSIS — R2689 Other abnormalities of gait and mobility: Secondary | ICD-10-CM | POA: Diagnosis not present

## 2017-07-20 DIAGNOSIS — R2681 Unsteadiness on feet: Secondary | ICD-10-CM

## 2017-07-20 DIAGNOSIS — R41841 Cognitive communication deficit: Secondary | ICD-10-CM

## 2017-07-20 NOTE — Patient Instructions (Signed)
Tip Card 1.The goal of habituation training is to assist in decreasing symptoms of vertigo, dizziness, or nausea provoked by specific head and body motions. 2.These exercises may initially increase symptoms; however, be persistent and work through symptoms. With repetition and time, the exercises will assist in reducing or eliminating symptoms. 3.Exercises should be stopped and discussed with the therapist if you experience any of the following: - Sudden change or fluctuation in hearing - New onset of ringing in the ears, or increase in current intensity - Any fluid discharge from the ear - Severe pain in neck or back - Extreme nausea   Sit to Side-Lying   Sit on edge of bed. Lie down onto the right side and hold until dizziness stops, plus 20 seconds.  Return to sitting and wait until dizziness stops, plus 20 seconds.  Repeat to the left side. Repeat sequence 5 times per session. Do 2 sessions per day. 

## 2017-07-20 NOTE — Therapy (Signed)
[facility] [address] [place], [place], [address] Phone: [phone]   Fax:  [phone]  Physical Therapy Treatment  Patient Details  Name: Lee Nelson MRN: [id number] Date of Birth: 31-Mar-1941 Referring Provider: Andrey Spearman, MD   Encounter Date: 07/20/2017  PT End of Session - 07/20/17 0940    Visit Number  15    Number of Visits  17    Date for PT Re-Evaluation  07/24/17    Authorization Type  [employer]    PT Start Time  0935    PT Stop Time  1015    PT Time Calculation (min)  40 min    Equipment Utilized During Treatment  Gait belt       Past Medical History:  Diagnosis Date  . Anxiety   . Arthritis   . Bladder calculus   . BPH (benign prostatic hyperplasia)   . Chronic constipation   . Complication of anesthesia    " I had some coughing afterwards for a couple of days"--  per pt "perfers spinal anesthesia since general anesthesia congnitive issues when older"  . Diverticulosis of colon   . Dry eye syndrome of both eyes   . Environmental allergies   . GERD (gastroesophageal reflux disease)    occasional  . History of adenomatous polyp of colon    08/ 2004  . History of kidney stones   . History of squamous cell carcinoma in situ (SCCIS) of skin    s/p  excision 2013 facial areas and 06/ 2016 nose  . Migraine    eye migraine occasional  . Seasonal and perennial allergic rhinitis   . Thrombocytopenia ([place])   . Tingling    hands and feet bilat , intermittantly-- per pt has lumbar bulging disk  . Urinary frequency   . Vocal fold atrophy    dysphonia-- per pt has to drink large amount of water to take even on pill  . Wears glasses     Past Surgical History:  Procedure Laterality Date  . APPENDECTOMY  child  . CARDIOVASCULAR STRESS TEST  05-17-2015  dr hilty   Low risk nuclear study w/ no ischemia/  normal LV function and wall motion , stress ef 54% (lvef 45-54%)  . CHOLECYSTECTOMY N/A  09/29/2014   Procedure: LAPAROSCOPIC CHOLECYSTECTOMY WITH INTRAOPERATIVE CHOLANGIOGRAM;  Surgeon: Alphonsa Overall, MD;  Location: [facility];  Service: General;  Laterality: N/A;  . COLONOSCOPY  last one 09-06-2010  . ESOPHAGOGASTRODUODENOSCOPY N/A 12/09/2013   Procedure: ESOPHAGOGASTRODUODENOSCOPY (EGD);  Surgeon: Arta Silence, MD;  Location: [facility];  Service: Endoscopy;  Laterality: N/A;  . EXTRACORPOREAL SHOCK WAVE LITHOTRIPSY  yrs ago  . INGUINAL HERNIA REPAIR Left child  . NISSEN FUNDOPLICATION  7209'O   open  . STONE EXTRACTION WITH BASKET N/A 08/18/2016   Procedure: STONE EXTRACTION WITH BASKET;  Surgeon: Carolan Clines, MD;  Location: [facility];  Service: Urology;  Laterality: N/A;  . THULIUM LASER TURP (TRANSURETHRAL RESECTION OF PROSTATE) N/A 08/18/2016   Procedure: THULIUM LASER BLADDER NECK INCISION AND BLADDER STONE REMOVAL;  Surgeon: Carolan Clines, MD;  Location: [facility];  Service: Urology;  Laterality: N/A;  . TONSILLECTOMY  child  . TRANSTHORACIC ECHOCARDIOGRAM  11/18/2010   grade 1 diastolic dysfunction, ef 70-96%/  trivial MR and TR/ mild dilated RA    There were no vitals filed for this visit.  Subjective Assessment - 07/20/17 0936    Subjective  Pt reports feeling dizzy and  nauseous over the weekend when looking down.     Pertinent History  memory loss, osteoperosis, degenerative disc disease in cervical spine    Limitations  House hold activities;Walking    Patient Stated Goals  "To get rid of the hip pain and figure out whats going on with my balance."     Currently in Pain?  Yes    Pain Score  1     Pain Location  Hip    Pain Orientation  Right    Pain Descriptors / Indicators  Aching    Pain Type  Chronic pain    Pain Onset  More than a month ago    Pain Frequency  Intermittent    Aggravating Factors   during the night    Pain Relieving Factors  movement, tylenol          [facility] PT Assessment - 07/20/17 0952       Functional Gait  Assessment   Gait assessed   Yes    Gait Level Surface  Walks 20 ft in less than 7 sec but greater than 5.5 sec, uses assistive device, slower speed, mild gait deviations, or deviates 6-10 in outside of the 12 in walkway width.    Change in Gait Speed  Able to smoothly change walking speed without loss of balance or gait deviation. Deviate no more than 6 in outside of the 12 in walkway width.    Gait with Horizontal Head Turns  Performs head turns smoothly with no change in gait. Deviates no more than 6 in outside 12 in walkway width    Gait with Vertical Head Turns  Performs head turns with no change in gait. Deviates no more than 6 in outside 12 in walkway width.    Gait and Pivot Turn  Pivot turns safely within 3 sec and stops quickly with no loss of balance.    Step Over Obstacle  Is able to step over 2 stacked shoe boxes taped together (9 in total height) without changing gait speed. No evidence of imbalance.    Gait with Narrow Base of Support  Is able to ambulate for 10 steps heel to toe with no staggering.    Gait with Eyes Closed  Walks 20 ft, uses assistive device, slower speed, mild gait deviations, deviates 6-10 in outside 12 in walkway width. Ambulates 20 ft in less than 9 sec but greater than 7 sec.    Ambulating Backwards  Walks 20 ft, no assistive devices, good speed, no evidence for imbalance, normal gait    Steps  Alternating feet, no rail.    Total Score  28    FGA comment:  Interpretation: 25-28 = low risk fall          Vestibular Assessment - 07/20/17 0001      Symptom Behavior   Type of Dizziness  Lightheadedness    Frequency of Dizziness  when bending over    Duration of Dizziness  short    Aggravating Factors  Forward bending    Relieving Factors  Rest      Occulomotor Exam   Occulomotor Alignment  Normal      Positional Testing   Sidelying Test  Sidelying Right;Sidelying Left      Sidelying Right   Sidelying Right Duration  none     Sidelying Right Symptoms  No nystagmus      Sidelying Left   Sidelying Left Duration  none    Sidelying Left Symptoms  No nystagmus  [facility] Adult PT Treatment/Exercise - 07/20/17 0950      Ambulation/Gait   Ambulation/Gait  Yes    Ambulation/Gait Assistance  6: Modified independent (Device/Increase time)    Ambulation Distance (Feet)  1100 Feet    Assistive device  None    Gait Pattern  Step-through pattern;Decreased stride length;Right flexed knee in stance;Left flexed knee in stance;Shuffle;Trunk flexed    Ambulation Surface  Level;Unlevel;Indoor;Outdoor;Paved;Grass    Ramp  6: Modified independent (Device)    Curb  6: Modified independent (Device/increase time)             PT Education - 07/20/17 1303    Education provided  Yes    Education Details  results of BPPV testing, habituation exercise    Person(s) Educated  Patient    Methods  Demonstration;Explanation;Handout    Comprehension  Verbalized understanding;Returned demonstration       PT Short Term Goals - 06/22/17 1248      PT SHORT TERM GOAL #1   Title  Pt will initiate HEP in order to indicate decreased fall risk and improved functional mobility.  (Target Date: 06/24/17)    Baseline  06/22/17: Pt verbalized performance of HEP in order to decrease fall risk and improve functional mobility.     Status  Achieved    Target Date  06/24/17      PT SHORT TERM GOAL #2   Title  Pt will improve FGA to 25/30 in order to indicate decreased fall risk.      Baseline  06/22/2017: 24/30 FGA today.     Period  Weeks    Status  Partially Met      PT SHORT TERM GOAL #3   Title  Pt will ambulate with gait speed >/=3.58 ft/sec in order to indicate more normal gait speed.      Baseline  06/22/2016: Gait speed scored 3.59 ft/sec indicating more normal gait speed.    Period  Weeks    Status  Achieved      PT SHORT TERM GOAL #4   Title  Pt will ambulate up to 500' at independent level with improved posture  and decreased shuffled gait in order to indicate decreased fall risk.      Baseline  Pt ambulated 500' feet at independent level with improved posture and decreased shuffled gait to indicate decreased fall risk.     Period  Weeks    Status  Achieved        PT Long Term Goals - 07/20/17 0941      PT LONG TERM GOAL #1   Title  Pt will be independent with HEP in order to indicate decreased fall risk and improved functional mobility.  (Target Date: 07/24/17)    Time  8    Period  Weeks    Status  New      PT LONG TERM GOAL #2   Title  Pt will improve FGA to 28/30 in order to indicate decreased fall risk.      Baseline  28/30 on 07/20/17    Time  8    Period  Weeks    Status  Achieved      PT LONG TERM GOAL #3   Title  Pt will ambulate >1000' independently over varying outdoor surfaces (including curb step and grass) in order to indicate return to community and leisure activity.     Baseline  met 07/20/17    Time  8    Period  Weeks  Status  Achieved      PT LONG TERM GOAL #4   Title  Pt will improve SOT composite score to 70 in order to indicate decreased fall risk.     Time  8    Period  Weeks    Status  New            Plan - 07/20/17 1303    Clinical Impression Statement  Skilled session focused on beginning to assess LTGs.  Note score of 28/30 on FGA indicative of very low fall risk.  He was also able to meet outdoor ambulation goal at mod I level.  Briefly assessed for positional vertigo today during session based on pts symptoms however testing was negative.  Provided habituation as he did report some dizziness when returning to sitting.  Pt verbalized understanding.  Will check remaining goals at next visit for D/C.     Rehab Potential  Good    Clinical Impairments Affecting Rehab Potential  co-morbidities    PT Frequency  2x / week    PT Duration  8 weeks    PT Treatment/Interventions  ADLs/Self Care Home Management;DME Instruction;Gait training;Stair  training;Functional mobility training;Therapeutic activities;Therapeutic exercise;Balance training;Neuromuscular re-education;Patient/family education;Orthotic Fit/Training;Passive range of motion;Vestibular    PT Next Visit Plan  Finish LTGs, SOT/HEP, D/C    PT Home Exercise Plan  added stretches for improved posture to HEP    Consulted and Agree with Plan of Care  Patient       Patient will benefit from skilled therapeutic intervention in order to improve the following deficits and impairments:  Decreased balance, Decreased mobility, Decreased range of motion, Decreased strength, Abnormal gait, Impaired flexibility, Impaired perceived functional ability, Impaired sensation, Improper body mechanics, Postural dysfunction  Visit Diagnosis: Abnormal posture  Other abnormalities of gait and mobility  Unsteadiness on feet  Muscle weakness (generalized)     Problem List Patient Active Problem List   Diagnosis Date Noted  . PSVT (paroxysmal supraventricular tachycardia) ([place]) 09/12/2015  . Chest pain 05/03/2015  . Dyspnea 05/03/2015  . ALLERGIC RHINITIS 09/21/2007  . G E R D 09/21/2007  . SMOKE INHALATION 09/21/2007  . OSTEOPOROSIS 01/28/2007  . PALPITATIONS 01/28/2007  . COUGH 01/28/2007  . ALLERGY 01/28/2007    Cameron Sprang, PT, MPT [facility] [address] [place], [place], [address] Phone: [phone]   Fax:  [phone] 07/20/17, 1:07 PM  Name: Lee Nelson MRN: [id number] Date of Birth: June 29, 1940

## 2017-07-20 NOTE — Therapy (Signed)
[facility] [address] [place], [place], [address] Phone: [phone]   Fax:  [phone]  Speech Language Pathology Treatment  Patient Details  Name: Lee JosephMichael K Schrodt MRN: [id number] Date of Birth: [birth year] Referring Provider: Dr. Joycelyn SchmidVikram Penumalli   Encounter Date: 07/20/2017  End of Session - 07/20/17 1254    Visit Number  2    Number of Visits  17    Date for SLP Re-Evaluation  08/24/17    SLP Start Time  0850    SLP Stop Time   0929 Pt requested to use restroom    SLP Time Calculation (min)  39 min    Activity Tolerance  Patient tolerated treatment well       Past Medical History:  Diagnosis Date  . Anxiety   . Arthritis   . Bladder calculus   . BPH (benign prostatic hyperplasia)   . Chronic constipation   . Complication of anesthesia    " I had some coughing afterwards for a couple of days"--  per pt "perfers spinal anesthesia since general anesthesia congnitive issues when older"  . Diverticulosis of colon   . Dry eye syndrome of both eyes   . Environmental allergies   . GERD (gastroesophageal reflux disease)    occasional  . History of adenomatous polyp of colon    08/ 2004  . History of kidney stones   . History of squamous cell carcinoma in situ (SCCIS) of skin    s/p  excision 2013 facial areas and 06/ 2016 nose  . Migraine    eye migraine occasional  . Seasonal and perennial allergic rhinitis   . Thrombocytopenia ([facility])   . Tingling    hands and feet bilat , intermittantly-- per pt has lumbar bulging disk  . Urinary frequency   . Vocal fold atrophy    dysphonia-- per pt has to drink large amount of water to take even on pill  . Wears glasses     Past Surgical History:  Procedure Laterality Date  . APPENDECTOMY  child  . CARDIOVASCULAR STRESS TEST  05-17-2015  dr hilty   Low risk nuclear study w/ no ischemia/  normal LV function and wall motion , stress ef 54% (lvef 45-54%)  .  CHOLECYSTECTOMY N/A 09/29/2014   Procedure: LAPAROSCOPIC CHOLECYSTECTOMY WITH INTRAOPERATIVE CHOLANGIOGRAM;  Surgeon: Ovidio Kinavid Newman, MD;  Location: [facility];  Service: General;  Laterality: N/A;  . COLONOSCOPY  last one 09-06-2010  . ESOPHAGOGASTRODUODENOSCOPY N/A 12/09/2013   Procedure: ESOPHAGOGASTRODUODENOSCOPY (EGD);  Surgeon: Willis ModenaWilliam Outlaw, MD;  [facility];  Service: Endoscopy;  Laterality: N/A;  . EXTRACORPOREAL SHOCK WAVE LITHOTRIPSY  yrs ago  . INGUINAL HERNIA REPAIR Left child  . NISSEN FUNDOPLICATION  1980's   open  . STONE EXTRACTION WITH BASKET N/A 08/18/2016   Procedure: STONE EXTRACTION WITH BASKET;  Surgeon: Jethro Bolusannenbaum, Sigmund, MD;  [facility];  Service: Urology;  Laterality: N/A;  . THULIUM LASER TURP (TRANSURETHRAL RESECTION OF PROSTATE) N/A 08/18/2016   Procedure: THULIUM LASER BLADDER NECK INCISION AND BLADDER STONE REMOVAL;  Surgeon: Jethro Bolusannenbaum, Sigmund, MD;  [facility];  Service: Urology;  Laterality: N/A;  . TONSILLECTOMY  child  . TRANSTHORACIC ECHOCARDIOGRAM  11/18/2010   grade 1 diastolic dysfunction, ef 55-60%/  trivial MR and TR/ mild dilated RA    There were no vitals filed for this visit.  Subjective Assessment - 07/20/17 1249    Subjective  Pt eager to discuss performance on  specific cognitive tasks at eval.    Currently in Pain?  Yes    Pain Score  1     Pain Location  Hip    Pain Orientation  Right    Pain Descriptors / Indicators  Aching    Pain Type  Chronic pain    Pain Onset  More than a month ago            ADULT SLP TREATMENT - 07/20/17 0850      General Information   Behavior/Cognition  Alert;Cooperative    Patient Positioning  Upright in chair      Treatment Provided   Treatment provided  Cognitive-Linquistic      Pain Assessment   Pain Assessment  No/denies pain      Cognitive-Linquistic Treatment   Treatment focused on  Cognition    Skilled Treatment  Pt requesting  information about his performance on cognitive-linguistic testing in previous session. SLP provided feedback re: attention and memory impairments seen on cognitive testing, and performance on [employer]. Pt requesting feedback re: individual cognitive tasks on the assessment, performed by a different therapist. SLP explained that performance on specific tasks would not be the focus of therapy, but that we will address how his impairments in attention and memory affect him in daily activities. Pt verbalized understanding. SLP explained that while pt's performance on naming test was [facility], word-finding would be addressed in therapy as pt has described functional deficits. SLP introduced compensations for word-finding difficulties, including semantic activation/description, drawing, and gestures. Pt showed SLP home task (generating 3 synonyms); pt had independently noted he was at times generating synoymns based on a homonym of the given word, vs 3 words with similar meanings as instructed. In the cases, mod question cues required to generate an appropriate 3rd synonym. SLP worked with pt to problem solve ways to improve his verbal fluency during lessons; pt questioned whether creating an outline would be helpful. SLP encouraged pt to create an outline using key words for an upcoming lecture and bring to next session.       Assessment / Recommendations / Plan   Plan  Continue with current plan of care      Progression Toward Goals   Progression toward goals  Progressing toward goals       SLP Education - 07/20/17 1252    Education provided  Yes    Education Details  performance on specific cognitive tasks/assessments less important; will focus on functional daily impacts of impairments in memory and attention    Person(s) Educated  Patient    Methods  Explanation    Comprehension  Verbalized understanding       SLP Short Term Goals - 07/20/17 1301      SLP SHORT TERM GOAL #1   Title  Pt will  complete complex naming tasks with 90% accuracy and rare min A    Time  4    Period  Weeks    Status  On-going      SLP SHORT TERM GOAL #2   Title  Pt will utilize compensations for dysnomia in structured tasks with rare min A over 2 sessions.    Time  4    Period  Weeks    Status  On-going      SLP SHORT TERM GOAL #3   Title  Pt will report carryover of 2 compensations for memory/attention outside of therapy over 2 sessions     Time  4  Period  Weeks    Status  On-going      SLP SHORT TERM GOAL #4   Title  Pt will divide attention between 2 simple cognitive linguistic tasks with 85% on each and occasional min A     Time  4    Period  Weeks    Status  On-going       SLP Long Term Goals - 07/20/17 1301      SLP LONG TERM GOAL #1   Title  Pt will utilize compensations for dysnomia in complex conversation over 10 minutes as needed with rare min A    Time  8    Period  Weeks    Status  On-going      SLP LONG TERM GOAL #2   Title  Pt will report carrover of 4 compensatory strategies for attention/memory outside of therapy over 2 sessions    Time  8    Period  Weeks    Status  On-going       Plan - 07/20/17 1255    Clinical Impression Statement  Mr. Treese presents with mild memory and attention impairments, and complains of word-finding difficulties that occur daily. 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 His spouse is incomplete quadraplegic requiring round the clock care and Mr. Schweikert reports significant emotional and financial stress around this. Stress may also adversely affect cognition. Mod question cues required in semantic tasks today; one instance of anomia noted. Continue skilled ST to maximize attention, word finding and train in compensations for attention, memory and word finding for continue success at work and independence at home.     Speech Therapy Frequency  2x / week    Treatment/Interventions  Cueing hierarchy;Environmental  controls;Language facilitation;Compensatory techniques;Cognitive reorganization;Functional tasks;SLP instruction and feedback;Patient/family education;Internal/external aids    Potential to Achieve Goals  Good    Potential Considerations  Family/community support    Consulted and Agree with Plan of Care  Patient       Patient will benefit from skilled therapeutic intervention in order to improve the following deficits and impairments:   Cognitive communication deficit    Problem List Patient Active Problem List   Diagnosis Date Noted  . PSVT (paroxysmal supraventricular tachycardia) ([facility]) 09/12/2015  . Chest pain 05/03/2015  . Dyspnea 05/03/2015  . ALLERGIC RHINITIS 09/21/2007  . G E R D 09/21/2007  . SMOKE INHALATION 09/21/2007  . OSTEOPOROSIS 01/28/2007  . PALPITATIONS 01/28/2007  . COUGH 01/28/2007  . ALLERGY 01/28/2007   Rondel Baton, MS, CCC-SLP Speech-Language Pathologist  Arlana Lindau 07/20/2017, 1:20 PM  [facility] [address] [place], [place], [address] Phone: [phone]   Fax:  [phone]   Name: DORIAN DUVAL MRN: [id number] Date of Birth: 07-31-1940

## 2017-07-23 DIAGNOSIS — H903 Sensorineural hearing loss, bilateral: Secondary | ICD-10-CM | POA: Diagnosis not present

## 2017-07-23 DIAGNOSIS — H9313 Tinnitus, bilateral: Secondary | ICD-10-CM | POA: Diagnosis not present

## 2017-07-23 DIAGNOSIS — H6122 Impacted cerumen, left ear: Secondary | ICD-10-CM | POA: Diagnosis not present

## 2017-07-24 ENCOUNTER — Ambulatory Visit: Payer: BLUE CROSS/BLUE SHIELD

## 2017-07-24 ENCOUNTER — Encounter: Payer: Self-pay | Admitting: Rehabilitation

## 2017-07-24 ENCOUNTER — Ambulatory Visit: Payer: BLUE CROSS/BLUE SHIELD | Admitting: Rehabilitation

## 2017-07-24 DIAGNOSIS — R293 Abnormal posture: Secondary | ICD-10-CM | POA: Diagnosis not present

## 2017-07-24 DIAGNOSIS — R2681 Unsteadiness on feet: Secondary | ICD-10-CM | POA: Diagnosis not present

## 2017-07-24 DIAGNOSIS — M6281 Muscle weakness (generalized): Secondary | ICD-10-CM

## 2017-07-24 DIAGNOSIS — R41841 Cognitive communication deficit: Secondary | ICD-10-CM | POA: Diagnosis not present

## 2017-07-24 DIAGNOSIS — R2689 Other abnormalities of gait and mobility: Secondary | ICD-10-CM

## 2017-07-24 NOTE — Therapy (Signed)
[facility] [address] [place], [place], [address] Phone: [phone]   Fax:  [phone]  Speech Language Pathology Treatment  Patient Details  Name: Lee Nelson MRN: [id number] Date of Birth: [birth year] Referring Provider: Dr. Joycelyn SchmidVikram Penumalli   Encounter Date: 07/24/2017  End of Session - 07/24/17 1057    Visit Number  3    Number of Visits  17    Date for SLP Re-Evaluation  08/24/17    SLP Start Time  0848    SLP Stop Time   0930    SLP Time Calculation (min)  42 min    Activity Tolerance  Patient tolerated treatment well       Past Medical History:  Diagnosis Date  . Anxiety   . Arthritis   . Bladder calculus   . BPH (benign prostatic hyperplasia)   . Chronic constipation   . Complication of anesthesia    " I had some coughing afterwards for a couple of days"--  per pt "perfers spinal anesthesia since general anesthesia congnitive issues when older"  . Diverticulosis of colon   . Dry eye syndrome of both eyes   . Environmental allergies   . GERD (gastroesophageal reflux disease)    occasional  . History of adenomatous polyp of colon    08/ 2004  . History of kidney stones   . History of squamous cell carcinoma in situ (SCCIS) of skin    s/p  excision 2013 facial areas and 06/ 2016 nose  . Migraine    eye migraine occasional  . Seasonal and perennial allergic rhinitis   . Thrombocytopenia (HCC)   . Tingling    hands and feet bilat , intermittantly-- per pt has lumbar bulging disk  . Urinary frequency   . Vocal fold atrophy    dysphonia-- per pt has to drink large amount of water to take even on pill  . Wears glasses     Past Surgical History:  Procedure Laterality Date  . APPENDECTOMY  child  . CARDIOVASCULAR STRESS TEST  05-17-2015  dr hilty   Low risk nuclear study w/ no ischemia/  normal LV function and wall motion , stress ef 54% (lvef 45-54%)  . CHOLECYSTECTOMY N/A 09/29/2014    Procedure: LAPAROSCOPIC CHOLECYSTECTOMY WITH INTRAOPERATIVE CHOLANGIOGRAM;  Surgeon: Ovidio Kinavid Newman, MD;  Location: [facility];  Service: General;  Laterality: N/A;  . COLONOSCOPY  last one 09-06-2010  . ESOPHAGOGASTRODUODENOSCOPY N/A 12/09/2013   Procedure: ESOPHAGOGASTRODUODENOSCOPY (EGD);  Surgeon: Willis ModenaWilliam Outlaw, MD;  Location: [facility];  Service: Endoscopy;  Laterality: N/A;  . EXTRACORPOREAL SHOCK WAVE LITHOTRIPSY  yrs ago  . INGUINAL HERNIA REPAIR Left child  . NISSEN FUNDOPLICATION  1980's   open  . STONE EXTRACTION WITH BASKET N/A 08/18/2016   Procedure: STONE EXTRACTION WITH BASKET;  Surgeon: Jethro Bolusannenbaum, Sigmund, MD;  [facility];  Service: Urology;  Laterality: N/A;  . THULIUM LASER TURP (TRANSURETHRAL RESECTION OF PROSTATE) N/A 08/18/2016   Procedure: THULIUM LASER BLADDER NECK INCISION AND BLADDER STONE REMOVAL;  Surgeon: Jethro Bolusannenbaum, Sigmund, MD;  [facility] [place];  Service: Urology;  Laterality: N/A;  . TONSILLECTOMY  child  . TRANSTHORACIC ECHOCARDIOGRAM  11/18/2010   grade 1 diastolic dysfunction, ef 55-60%/  trivial MR and TR/ mild dilated RA    There were no vitals filed for this visit.  Subjective Assessment - 07/24/17 0914    Subjective  Arrived forgetting notebook in lobby  ADULT SLP TREATMENT - 07/24/17 0936      General Information   Behavior/Cognition  Alert;Cooperative;Pleasant mood      Treatment Provided   Treatment provided  Cognitive-Linquistic      Cognitive-Linquistic Treatment   Treatment focused on  Cognition    Skilled Treatment  SLP worked with pt on his linguistic organization with naming tasks - pt req'd cues to stay on task with requested directions, and min-mod A to generate 3 different meanings for words. SLP also discussed with pt his compensatory measures for memory and it appears they are functional for him at this time. SLP encouraged pt to bring any difficulties with these to SLP's  attention in the future, for assistance.      Assessment / Recommendations / Plan   Plan  Continue with current plan of care      Progression Toward Goals   Progression toward goals  Progressing toward goals       SLP Education - 07/24/17 1056    Education provided  Yes    Education Details  memory compensations    Person(s) Educated  Patient    Methods  Explanation    Comprehension  Verbalized understanding       SLP Short Term Goals - 07/24/17 1057      SLP SHORT TERM GOAL #1   Title  Pt will complete complex naming tasks with 90% accuracy and rare min A    Time  4    Period  Weeks    Status  On-going      SLP SHORT TERM GOAL #2   Title  Pt will utilize compensations for dysnomia in structured tasks with rare min A over 2 sessions.    Time  4    Period  Weeks    Status  On-going      SLP SHORT TERM GOAL #3   Title  Pt will report carryover of 2 compensations for memory/attention outside of therapy over 2 sessions     Time  4    Period  Weeks    Status  On-going      SLP SHORT TERM GOAL #4   Title  Pt will divide attention between 2 simple cognitive linguistic tasks with 85% on each and occasional min A     Time  4    Period  Weeks    Status  On-going       SLP Long Term Goals - 07/24/17 1058      SLP LONG TERM GOAL #1   Title  Pt will utilize compensations for dysnomia in complex conversation over 10 minutes as needed with rare min A    Time  8    Period  Weeks    Status  On-going      SLP LONG TERM GOAL #2   Title  Pt will report carrover of 4 compensatory strategies for attention/memory outside of therapy over 2 sessions    Time  8    Period  Weeks    Status  On-going       Plan - 07/24/17 1057    Clinical Impression Statement  Lee Nelson continues to present with mild memory and attention impairments (observed in linguistic organization tasks today), and complains of word-finding difficulties that occur daily. 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 His spouse is incomplete quadraplegic requiring round the clock care and Lee Nelson reports significant emotional and financial stress around this. Stress  may also adversely affect cognition. Mod question cues required in semantic tasks today; one instance of anomia noted. Continue skilled ST to maximize attention, word finding and train in compensations for attention, memory and word finding for continue success at work and independence at home.     Speech Therapy Frequency  2x / week    Treatment/Interventions  Cueing hierarchy;Environmental controls;Language facilitation;Compensatory techniques;Cognitive reorganization;Functional tasks;SLP instruction and feedback;Patient/family education;Internal/external aids    Potential to Achieve Goals  Good    Potential Considerations  Family/community support    Consulted and Agree with Plan of Care  Patient       Patient will benefit from skilled therapeutic intervention in order to improve the following deficits and impairments:   Cognitive communication deficit    Problem List Patient Active Problem List   Diagnosis Date Noted  . PSVT (paroxysmal supraventricular tachycardia) (HCC) 09/12/2015  . Chest pain 05/03/2015  . Dyspnea 05/03/2015  . ALLERGIC RHINITIS 09/21/2007  . G E R D 09/21/2007  . SMOKE INHALATION 09/21/2007  . OSTEOPOROSIS 01/28/2007  . PALPITATIONS 01/28/2007  . COUGH 01/28/2007  . ALLERGY 01/28/2007    [facility] ,MS, CCC-SLP  07/24/2017, 10:58 AM  [facility] [address] [place], [place], [address] Phone: [phone]   Fax:  [phone]   Name: Lee Nelson MRN: [id number] Date of Birth: 1941/03/05

## 2017-07-24 NOTE — Patient Instructions (Addendum)
   Spend 25-30 minutes 5-6 days a week doing homework.  Please complete the assigned speech therapy homework prior to your next session and return it to the speech therapist at your next visit.

## 2017-07-24 NOTE — Therapy (Signed)
[facility] [address] [place], [place], [address] Phone: [phone]   Fax:  [phone]  Physical Therapy Treatment and D/C Summary   Patient Details  Name: Lee Nelson MRN: [id number] Date of Birth: 1940/04/02 Referring Provider: Andrey Spearman, MD   Encounter Date: 07/24/2017  PT End of Session - 07/24/17 0938    Visit Number  16    Number of Visits  17    Date for PT Re-Evaluation  07/24/17    Authorization Type  [employer]    PT Start Time  0935    PT Stop Time  1015    PT Time Calculation (min)  40 min    Equipment Utilized During Treatment  --    Activity Tolerance  Patient tolerated treatment well    Behavior During Therapy  [facility] for tasks assessed/performed       Past Medical History:  Diagnosis Date  . Anxiety   . Arthritis   . Bladder calculus   . BPH (benign prostatic hyperplasia)   . Chronic constipation   . Complication of anesthesia    " I had some coughing afterwards for a couple of days"--  per pt "perfers spinal anesthesia since general anesthesia congnitive issues when older"  . Diverticulosis of colon   . Dry eye syndrome of both eyes   . Environmental allergies   . GERD (gastroesophageal reflux disease)    occasional  . History of adenomatous polyp of colon    08/ 2004  . History of kidney stones   . History of squamous cell carcinoma in situ (SCCIS) of skin    s/p  excision 2013 facial areas and 06/ 2016 nose  . Migraine    eye migraine occasional  . Seasonal and perennial allergic rhinitis   . Thrombocytopenia ([place])   . Tingling    hands and feet bilat , intermittantly-- per pt has lumbar bulging disk  . Urinary frequency   . Vocal fold atrophy    dysphonia-- per pt has to drink large amount of water to take even on pill  . Wears glasses     Past Surgical History:  Procedure Laterality Date  . APPENDECTOMY  child  . CARDIOVASCULAR STRESS TEST  05-17-2015  dr hilty   Low risk nuclear study w/ no ischemia/  normal LV function and wall motion , stress ef 54% (lvef 45-54%)  . CHOLECYSTECTOMY N/A 09/29/2014   Procedure: LAPAROSCOPIC CHOLECYSTECTOMY WITH INTRAOPERATIVE CHOLANGIOGRAM;  Surgeon: Alphonsa Overall, MD;  Location: [facility];  Service: General;  Laterality: N/A;  . COLONOSCOPY  last one 09-06-2010  . ESOPHAGOGASTRODUODENOSCOPY N/A 12/09/2013   Procedure: ESOPHAGOGASTRODUODENOSCOPY (EGD);  Surgeon: Arta Silence, MD;  [facility];  Service: Endoscopy;  Laterality: N/A;  . EXTRACORPOREAL SHOCK WAVE LITHOTRIPSY  yrs ago  . INGUINAL HERNIA REPAIR Left child  . NISSEN FUNDOPLICATION  0630'Z   open  . STONE EXTRACTION WITH BASKET N/A 08/18/2016   Procedure: STONE EXTRACTION WITH BASKET;  Surgeon: Carolan Clines, MD;  Location: [facility];  Service: Urology;  Laterality: N/A;  . THULIUM LASER TURP (TRANSURETHRAL RESECTION OF PROSTATE) N/A 08/18/2016   Procedure: THULIUM LASER BLADDER NECK INCISION AND BLADDER STONE REMOVAL;  Surgeon: Carolan Clines, MD;  Location: [facility];  Service: Urology;  Laterality: N/A;  . TONSILLECTOMY  child  . TRANSTHORACIC ECHOCARDIOGRAM  11/18/2010   grade 1 diastolic dysfunction, ef 60-10%/  trivial MR and TR/ mild dilated RA    There  were no vitals filed for this visit.  Subjective Assessment - 07/24/17 0936    Subjective  Pt reports increased foot pain today, wearing sandals, has tennis shoes for balance test.     Pertinent History  memory loss, osteoperosis, degenerative disc disease in cervical spine    Limitations  House hold activities;Walking    Patient Stated Goals  "To get rid of the hip pain and figure out whats going on with my balance."     Currently in Pain?  Yes    Pain Score  2     Pain Location  Hip    Pain Orientation  Right    Pain Descriptors / Indicators  Aching    Pain Type  Chronic pain    Pain Onset  More than a month ago    Pain Frequency   Intermittent    Aggravating Factors   during the night    Multiple Pain Sites  Yes    Pain Score  2    Pain Location  Arm    Pain Orientation  Right;Left    Pain Descriptors / Indicators  Aching    Pain Type  Acute pain    Pain Onset  More than a month ago    Pain Frequency  Intermittent    Aggravating Factors   wearing tennis shoes, walking more    Pain Relieving Factors  changing shoes, rest                 Neuro re-ed: sensory organization test performed with following results: Conditions: 1: Normal 2: normal 3: normal  4: normal 5: normal 6: normal Composite score: 83 Sensory Analysis Som: normal Vis: normal Vest: normal Pref: normal Strategy analysis: normal       COG alignment: normal              [facility] Adult PT Treatment/Exercise - 07/24/17 1026      Ambulation/Gait   Ambulation/Gait  Yes    Ambulation/Gait Assistance  6: Modified independent (Device/Increase time)      Exercises   Exercises  Other Exercises    Other Exercises   Briefly reviewed HEP as pt needed.  Had him perform SL and supine IT band stretch as these have been difficult in past.  He did them all today x 30 secs BLE with good technique.  Also performed supine pectoral stretch (did without towel) x 30 secs, encouraged him to begin using small towel roll at home for increased stretch.  Performed PWR! up exercise x 10 reps with cues for increased scapular retraction/trunk extension.  Pt demo'd a few tai chi type exercises that he is currently doing at home.  Min cues for posture during these exercises.               PT Education - 07/24/17 [phone]    Education provided  Yes    Person(s) Educated  Patient    Methods  Explanation    Comprehension  Verbalized understanding       PT Short Term Goals - 06/22/17 1248      PT SHORT TERM GOAL #1   Title  Pt will initiate HEP in order to indicate decreased fall risk and improved functional mobility.  (Target Date: 06/24/17)     Baseline  06/22/17: Pt verbalized performance of HEP in order to decrease fall risk and improve functional mobility.     Status  Achieved    Target Date  06/24/17  PT SHORT TERM GOAL #2   Title  Pt will improve FGA to 25/30 in order to indicate decreased fall risk.      Baseline  06/22/2017: 24/30 FGA today.     Period  Weeks    Status  Partially Met      PT SHORT TERM GOAL #3   Title  Pt will ambulate with gait speed >/=3.58 ft/sec in order to indicate more normal gait speed.      Baseline  06/22/2016: Gait speed scored 3.59 ft/sec indicating more normal gait speed.    Period  Weeks    Status  Achieved      PT SHORT TERM GOAL #4   Title  Pt will ambulate up to 500' at independent level with improved posture and decreased shuffled gait in order to indicate decreased fall risk.      Baseline  Pt ambulated 500' feet at independent level with improved posture and decreased shuffled gait to indicate decreased fall risk.     Period  Weeks    Status  Achieved        PT Long Term Goals - 07/24/17 1002      PT LONG TERM GOAL #1   Title  Pt will be independent with HEP in order to indicate decreased fall risk and improved functional mobility.  (Target Date: 07/24/17)    Baseline  met 07/24/17    Time  8    Period  Weeks    Status  Achieved      PT LONG TERM GOAL #2   Title  Pt will improve FGA to 28/30 in order to indicate decreased fall risk.      Baseline  28/30 on 07/20/17    Time  8    Period  Weeks    Status  Achieved      PT LONG TERM GOAL #3   Title  Pt will ambulate >1000' independently over varying outdoor surfaces (including curb step and grass) in order to indicate return to community and leisure activity.     Baseline  met 07/20/17    Time  8    Period  Weeks    Status  Achieved      PT LONG TERM GOAL #4   Title  Pt will improve SOT composite score to 70 in order to indicate decreased fall risk.     Baseline  composite score of 83 (baseline of 63)    Time  8     Period  Weeks    Status  Achieved            Plan - 07/24/17 1029    Clinical Impression Statement  Session focused on assessment of remaining LTGs with SOT and HEP.  Pt has met 4/4 LTGs with 20 point improvement on SOT and good carryover with HEP.  Pt ready for D/C.     Rehab Potential  Good    Clinical Impairments Affecting Rehab Potential  co-morbidities    PT Frequency  2x / week    PT Duration  8 weeks    PT Treatment/Interventions  ADLs/Self Care Home Management;DME Instruction;Gait training;Stair training;Functional mobility training;Therapeutic activities;Therapeutic exercise;Balance training;Neuromuscular re-education;Patient/family education;Orthotic Fit/Training;Passive range of motion;Vestibular    PT Home Exercise Plan  added stretches for improved posture to HEP    Consulted and Agree with Plan of Care  Patient       Patient will benefit from skilled therapeutic intervention in order to improve the following deficits and impairments:  Decreased balance, Decreased mobility, Decreased range of motion, Decreased strength, Abnormal gait, Impaired flexibility, Impaired perceived functional ability, Impaired sensation, Improper body mechanics, Postural dysfunction  Visit Diagnosis: Abnormal posture  Other abnormalities of gait and mobility  Unsteadiness on feet  Muscle weakness (generalized)  PHYSICAL THERAPY DISCHARGE SUMMARY  Visits from Start of Care: 16  Current functional level related to goals / functional outcomes: See LTGs above   Remaining deficits: Pt with postural weakness and deficits, but has made marked progress with weakness and balance.  Has HEP to address.    Education / Equipment: HEP  Plan: Patient agrees to discharge.  Patient goals were met. Patient is being discharged due to meeting the stated rehab goals.  ?????        Problem List Patient Active Problem List   Diagnosis Date Noted  . PSVT (paroxysmal supraventricular tachycardia)  ([place]) 09/12/2015  . Chest pain 05/03/2015  . Dyspnea 05/03/2015  . ALLERGIC RHINITIS 09/21/2007  . G E R D 09/21/2007  . SMOKE INHALATION 09/21/2007  . OSTEOPOROSIS 01/28/2007  . PALPITATIONS 01/28/2007  . COUGH 01/28/2007  . ALLERGY 01/28/2007    Cameron Sprang, PT, MPT [facility] [address] [place], [place], [address] Phone: [phone]   Fax:  [phone] 07/24/17, 10:31 AM  Name: HYRUM SHANEYFELT MRN: [id number] Date of Birth: 1940-08-07

## 2017-07-27 ENCOUNTER — Ambulatory Visit: Payer: BLUE CROSS/BLUE SHIELD | Admitting: Speech Pathology

## 2017-07-27 DIAGNOSIS — R41841 Cognitive communication deficit: Secondary | ICD-10-CM

## 2017-07-27 DIAGNOSIS — R293 Abnormal posture: Secondary | ICD-10-CM | POA: Diagnosis not present

## 2017-07-27 DIAGNOSIS — R2681 Unsteadiness on feet: Secondary | ICD-10-CM | POA: Diagnosis not present

## 2017-07-27 DIAGNOSIS — R2689 Other abnormalities of gait and mobility: Secondary | ICD-10-CM | POA: Diagnosis not present

## 2017-07-27 DIAGNOSIS — M6281 Muscle weakness (generalized): Secondary | ICD-10-CM | POA: Diagnosis not present

## 2017-07-27 NOTE — Therapy (Signed)
[facility] [address] [place], [place], [address] Phone: [phone]   Fax:  [phone]  Speech Language Pathology Treatment  Patient Details  Name: Lee Nelson MRN: [id number] Date of Birth: Oct 23, 1940 Referring Provider: Dr. Joycelyn Schmid   Encounter Date: 07/27/2017  End of Session - 07/27/17 1349    Visit Number  4    Number of Visits  17    Date for SLP Re-Evaluation  08/24/17    SLP Start Time  1014    SLP Stop Time   1101    SLP Time Calculation (min)  47 min    Activity Tolerance  Patient tolerated treatment well       Past Medical History:  Diagnosis Date  . Anxiety   . Arthritis   . Bladder calculus   . BPH (benign prostatic hyperplasia)   . Chronic constipation   . Complication of anesthesia    " I had some coughing afterwards for a couple of days"--  per pt "perfers spinal anesthesia since general anesthesia congnitive issues when older"  . Diverticulosis of colon   . Dry eye syndrome of both eyes   . Environmental allergies   . GERD (gastroesophageal reflux disease)    occasional  . History of adenomatous polyp of colon    08/ 2004  . History of kidney stones   . History of squamous cell carcinoma in situ (SCCIS) of skin    s/p  excision 2013 facial areas and 06/ 2016 nose  . Migraine    eye migraine occasional  . Seasonal and perennial allergic rhinitis   . Thrombocytopenia (HCC)   . Tingling    hands and feet bilat , intermittantly-- per pt has lumbar bulging disk  . Urinary frequency   . Vocal fold atrophy    dysphonia-- per pt has to drink large amount of water to take even on pill  . Wears glasses     Past Surgical History:  Procedure Laterality Date  . APPENDECTOMY  child  . CARDIOVASCULAR STRESS TEST  05-17-2015  dr hilty   Low risk nuclear study w/ no ischemia/  normal LV function and wall motion , stress ef 54% (lvef 45-54%)  . CHOLECYSTECTOMY N/A 09/29/2014   Procedure: LAPAROSCOPIC CHOLECYSTECTOMY WITH INTRAOPERATIVE CHOLANGIOGRAM;  Surgeon: Ovidio Kin, MD;  Location: [facility];  Service: General;  Laterality: N/A;  . COLONOSCOPY  last one 09-06-2010  . ESOPHAGOGASTRODUODENOSCOPY N/A 12/09/2013   Procedure: ESOPHAGOGASTRODUODENOSCOPY (EGD);  Surgeon: Willis Modena, MD;  Location: [facility] ENDOSCOPY;  Service: Endoscopy;  Laterality: N/A;  . EXTRACORPOREAL SHOCK WAVE LITHOTRIPSY  yrs ago  . INGUINAL HERNIA REPAIR Left child  . NISSEN FUNDOPLICATION  1980's   open  . STONE EXTRACTION WITH BASKET N/A 08/18/2016   Procedure: STONE EXTRACTION WITH BASKET;  Surgeon: Jethro Bolus, MD;  Location: [facility];  Service: Urology;  Laterality: N/A;  . THULIUM LASER TURP (TRANSURETHRAL RESECTION OF PROSTATE) N/A 08/18/2016   Procedure: THULIUM LASER BLADDER NECK INCISION AND BLADDER STONE REMOVAL;  Surgeon: Jethro Bolus, MD;  Location: [facility];  Service: Urology;  Laterality: N/A;  . TONSILLECTOMY  child  . TRANSTHORACIC ECHOCARDIOGRAM  11/18/2010   grade 1 diastolic dysfunction, ef 55-60%/  trivial MR and TR/ mild dilated RA    There were no vitals filed for this visit.  Subjective Assessment - 07/27/17 1015    Subjective  "I want you to explain the purpose of doing this work."  Currently in Pain?  Yes    Pain Score  1     Pain Location  Hip    Pain Orientation  Right    Pain Descriptors / Indicators  Aching    Pain Type  Chronic pain    Pain Onset  More than a month ago    Pain Frequency  Intermittent            ADULT SLP TREATMENT - 07/27/17 1014      General Information   Behavior/Cognition  Alert;Cooperative;Pleasant mood    Patient Positioning  Upright in chair      Treatment Provided   Treatment provided  Cognitive-Linquistic      Pain Assessment   Pain Assessment  No/denies pain      Cognitive-Linquistic Treatment   Treatment focused on  Cognition    Skilled Treatment  Pt arrived and  in pleasant mood, requesting information about purpose of completing home tasks and specific goals of therapy. SLP educated pt re: goals for naming, attention/memory and compensations for dysnomia. SLP informed pt that naming tasks assigned for home practice based on principles of neuroplasticity. Questions answered to pt's satisfaction. Pt showed SLP work he completed at home (naming categories, naming items in categories). SLP noted several instances in which pt gave inappropriate responses for additional items in category. Pt fixating on specific details vs broader category; occasional min A required. SLP provided handout and education re: WARM strategies and tips for memory. Worked with pt on beginning an outline for a lecture; pt wrote overview with few details. He admitted straying from the topic during lectures at times. SLP discussed strategies to improve his attention using an outline, breaking lecture topic into smaller parts, using time limits for specific portions. Pt suggested that rehearsing beforehand would also be beneficial. SLP agreed, reinforcing writing and repetition as memory strategies reviewed earlier. Pt to revise and add details to his outline at home. Min verbal cue required for pt to record written reminder to complete before next session.       Assessment / Recommendations / Plan   Plan  Continue with current plan of care      Progression Toward Goals   Progression toward goals  Progressing toward goals       SLP Education - 07/27/17 1349    Education provided  Yes    Education Details  memory compensations    Person(s) Educated  Patient    Methods  Explanation;Handout    Comprehension  Verbalized understanding       SLP Short Term Goals - 07/27/17 1350      SLP SHORT TERM GOAL #1   Title  Pt will complete complex naming tasks with 90% accuracy and rare min A    Time  3    Period  Weeks    Status  On-going      SLP SHORT TERM GOAL #2   Title  Pt will utilize  compensations for dysnomia in structured tasks with rare min A over 2 sessions.    Time  3    Period  Weeks    Status  On-going      SLP SHORT TERM GOAL #3   Title  Pt will report carryover of 2 compensations for memory/attention outside of therapy over 2 sessions     Time  3    Period  Weeks    Status  On-going      SLP SHORT TERM GOAL #4   Title  Pt will divide attention between 2 simple cognitive linguistic tasks with 85% on each and occasional min A     Time  3    Period  Weeks    Status  On-going       SLP Long Term Goals - 07/27/17 1351      SLP LONG TERM GOAL #1   Title  Pt will utilize compensations for dysnomia in complex conversation over 10 minutes as needed with rare min A    Time  7    Period  Weeks    Status  On-going      SLP LONG TERM GOAL #2   Title  Pt will report carrover of 4 compensatory strategies for attention/memory outside of therapy over 2 sessions    Time  7    Period  Weeks    Status  On-going       Plan - 07/27/17 1350    Clinical Impression Statement  Lee Nelson continues to present with mild memory and attention impairments (observed in linguistic organization tasks today), and complains of word-finding difficulties that occur daily. 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 Stress may also adversely affect cognition. Mod question cues required in semantic tasks today; one instance of anomia noted. Continue skilled ST to maximize attention, word finding and train in compensations for attention, memory and word finding for continue success at work and independence at home.     Speech Therapy Frequency  2x / week    Treatment/Interventions  Cueing hierarchy;Environmental controls;Language facilitation;Compensatory techniques;Cognitive reorganization;Functional tasks;SLP  instruction and feedback;Patient/family education;Internal/external aids    Potential to Achieve Goals  Good    Potential Considerations  Family/community support    Consulted and Agree with Plan of Care  Patient       Patient will benefit from skilled therapeutic intervention in order to improve the following deficits and impairments:   Cognitive communication deficit    Problem List Patient Active Problem List   Diagnosis Date Noted  . PSVT (paroxysmal supraventricular tachycardia) (HCC) 09/12/2015  . Chest pain 05/03/2015  . Dyspnea 05/03/2015  . ALLERGIC RHINITIS 09/21/2007  . G E R D 09/21/2007  . SMOKE INHALATION 09/21/2007  . OSTEOPOROSIS 01/28/2007  . PALPITATIONS 01/28/2007  . COUGH 01/28/2007  . ALLERGY 01/28/2007   Lee Baton, Lee Nelson, Lee Nelson Speech-Language Pathologist  Lee Nelson 07/27/2017, 1:51 PM  [facility] [address] [place], [place], [address] Phone: [phone]   Fax:  [phone]   Name: Lee Nelson MRN: [id number] Date of Birth: [birth year]

## 2017-07-27 NOTE — Patient Instructions (Signed)

## 2017-07-30 ENCOUNTER — Ambulatory Visit: Payer: BLUE CROSS/BLUE SHIELD | Attending: Diagnostic Neuroimaging

## 2017-07-30 DIAGNOSIS — R41841 Cognitive communication deficit: Secondary | ICD-10-CM | POA: Diagnosis not present

## 2017-07-30 NOTE — Patient Instructions (Signed)
  Please complete the assigned speech therapy homework prior to your next session and return it to the speech therapist at your next visit.  

## 2017-07-30 NOTE — Therapy (Signed)
[facility] [address] [place], [place], [address] Phone: [phone]   Fax:  [phone]  Speech Language Pathology Treatment  Patient Details  Name: Lee Nelson MRN: [id number] Date of Birth: 1941-03-20 Referring Provider: Dr. Joycelyn Nelson   Encounter Date: 07/30/2017  End of Session - 07/30/17 1744    Visit Number  5    Number of Visits  17    Date for SLP Re-Evaluation  08/24/17    SLP Start Time  0931    SLP Stop Time   1015    SLP Time Calculation (min)  44 min    Activity Tolerance  Patient tolerated treatment well       Past Medical History:  Diagnosis Date  . Anxiety   . Arthritis   . Bladder calculus   . BPH (benign prostatic hyperplasia)   . Chronic constipation   . Complication of anesthesia    " I had some coughing afterwards for a couple of days"--  per pt "perfers spinal anesthesia since general anesthesia congnitive issues when older"  . Diverticulosis of colon   . Dry eye syndrome of both eyes   . Environmental allergies   . GERD (gastroesophageal reflux disease)    occasional  . History of adenomatous polyp of colon    08/ 2004  . History of kidney stones   . History of squamous cell carcinoma in situ (SCCIS) of skin    s/p  excision 2013 facial areas and 06/ 2016 nose  . Migraine    eye migraine occasional  . Seasonal and perennial allergic rhinitis   . Thrombocytopenia (HCC)   . Tingling    hands and feet bilat , intermittantly-- per pt has lumbar bulging disk  . Urinary frequency   . Vocal fold atrophy    dysphonia-- per pt has to drink large amount of water to take even on pill  . Wears glasses     Past Surgical History:  Procedure Laterality Date  . APPENDECTOMY  child  . CARDIOVASCULAR STRESS TEST  05-17-2015  dr hilty   Low risk nuclear study w/ no ischemia/  normal LV function and wall motion , stress ef 54% (lvef 45-54%)  . CHOLECYSTECTOMY N/A 09/29/2014    Procedure: LAPAROSCOPIC CHOLECYSTECTOMY WITH INTRAOPERATIVE CHOLANGIOGRAM;  Surgeon: Ovidio Kin, MD;  Location: [facility];  Service: General;  Laterality: N/A;  . COLONOSCOPY  last one 09-06-2010  . ESOPHAGOGASTRODUODENOSCOPY N/A 12/09/2013   Procedure: ESOPHAGOGASTRODUODENOSCOPY (EGD);  Surgeon: Willis Modena, MD;  Location: [facility] ENDOSCOPY;  Service: Endoscopy;  Laterality: N/A;  . EXTRACORPOREAL SHOCK WAVE LITHOTRIPSY  yrs ago  . INGUINAL HERNIA REPAIR Left child  . NISSEN FUNDOPLICATION  1980's   open  . STONE EXTRACTION WITH BASKET N/A 08/18/2016   Procedure: STONE EXTRACTION WITH BASKET;  Surgeon: Jethro Bolus, MD;  Location: [facility];  Service: Urology;  Laterality: N/A;  . THULIUM LASER TURP (TRANSURETHRAL RESECTION OF PROSTATE) N/A 08/18/2016   Procedure: THULIUM LASER BLADDER NECK INCISION AND BLADDER STONE REMOVAL;  Surgeon: Jethro Bolus, MD;  Location: [facility];  Service: Urology;  Laterality: N/A;  . TONSILLECTOMY  child  . TRANSTHORACIC ECHOCARDIOGRAM  11/18/2010   grade 1 diastolic dysfunction, ef 55-60%/  trivial MR and TR/ mild dilated RA    There were no vitals filed for this visit.  Subjective Assessment - 07/30/17 0948    Subjective  Pt enters with homework presented to SLP.    Currently  in Pain?  Yes    Pain Score  1     Pain Location  Hip    Pain Orientation  Right    Pain Descriptors / Indicators  Aching    Pain Type  Chronic pain    Pain Onset  More than a month ago    Pain Frequency  Intermittent            ADULT SLP TREATMENT - 07/30/17 0956      General Information   Behavior/Cognition  Alert;Cooperative;Pleasant mood      Treatment Provided   Treatment provided  Cognitive-Linquistic      Cognitive-Linquistic Treatment   Treatment focused on  Cognition    Skilled Treatment  SLP targeted divided attention today with simple written task and auditory task - SLP provided occasional min cues for attending  to the competing auditory task. With linguistic organization, pt had difficulty generating >2 meanings for multiple meaning words; SLP needed to provide pt with consistent mod cues for incr'd pt success.      Assessment / Recommendations / Plan   Plan  Continue with current plan of care      Progression Toward Goals   Progression toward goals  Progressing toward goals         SLP Short Term Goals - 07/30/17 1019      SLP SHORT TERM GOAL #1   Title  Pt will complete complex naming tasks with 90% accuracy and rare min A    Time  3    Period  Weeks    Status  On-going      SLP SHORT TERM GOAL #2   Title  Pt will utilize compensations for dysnomia in structured tasks with rare min A over 2 sessions.    Time  3    Period  Weeks    Status  On-going      SLP SHORT TERM GOAL #3   Title  Pt will report carryover of 2 compensations for memory/attention outside of therapy over 2 sessions     Time  3    Period  Weeks    Status  On-going      SLP SHORT TERM GOAL #4   Title  Pt will divide attention between 2 simple cognitive linguistic tasks with 85% on each and occasional min A     Time  3    Period  Weeks    Status  On-going       SLP Long Term Goals - 07/30/17 1020      SLP LONG TERM GOAL #1   Title  Pt will utilize compensations for dysnomia in complex conversation over 10 minutes as needed with rare min A    Time  7    Period  Weeks    Status  On-going      SLP LONG TERM GOAL #2   Title  Pt will report carrover of 4 compensatory strategies for attention/memory outside of therapy over 2 sessions    Time  7    Period  Weeks    Status  On-going       Plan - 07/30/17 1744    Clinical Impression Statement  Mild memory and attention impairments were observed in linguistic organization tasks today. Pt continues to complain of word-finding difficulties that occur daily, although this SLP did not hear any today. 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 Stress may also adversely affect cognition. Mod question cues required in semantic tasks today; one instance of anomia noted. Continue skilled ST to maximize attention, word finding and train in compensations for attention, memory and word finding for continue success at work and independence at home.     Speech Therapy Frequency  2x / week    Treatment/Interventions  Cueing hierarchy;Environmental controls;Language facilitation;Compensatory techniques;Cognitive reorganization;Functional tasks;SLP instruction and feedback;Patient/family education;Internal/external aids    Potential to Achieve Goals  Good    Potential Considerations  Family/community support    Consulted and Agree with Plan of Care  Patient       Patient will benefit from skilled therapeutic intervention in order to improve the following deficits and impairments:   Cognitive communication deficit    Problem List Patient Active Problem List   Diagnosis Date Noted  . PSVT (paroxysmal supraventricular tachycardia) (HCC) 09/12/2015  . Chest pain 05/03/2015  . Dyspnea 05/03/2015  . ALLERGIC RHINITIS 09/21/2007  . G E R D 09/21/2007  . SMOKE INHALATION 09/21/2007  . OSTEOPOROSIS 01/28/2007  . PALPITATIONS 01/28/2007  . COUGH 01/28/2007  . ALLERGY 01/28/2007    [facility] ,MS, CCC-SLP  07/30/2017, 5:47 PM  [place] [facility] [address] [place], [place], [address] Phone: [phone]   Fax:  [phone]   Name: Lee Nelson MRN: [id number] Date of Birth: 10-11-1940

## 2017-08-03 ENCOUNTER — Ambulatory Visit: Payer: BLUE CROSS/BLUE SHIELD | Admitting: Speech Pathology

## 2017-08-03 DIAGNOSIS — R3914 Feeling of incomplete bladder emptying: Secondary | ICD-10-CM | POA: Diagnosis not present

## 2017-08-03 DIAGNOSIS — N401 Enlarged prostate with lower urinary tract symptoms: Secondary | ICD-10-CM | POA: Diagnosis not present

## 2017-08-04 DIAGNOSIS — J069 Acute upper respiratory infection, unspecified: Secondary | ICD-10-CM | POA: Diagnosis not present

## 2017-08-04 DIAGNOSIS — Z681 Body mass index (BMI) 19 or less, adult: Secondary | ICD-10-CM | POA: Diagnosis not present

## 2017-08-04 DIAGNOSIS — R05 Cough: Secondary | ICD-10-CM | POA: Diagnosis not present

## 2017-08-04 DIAGNOSIS — J309 Allergic rhinitis, unspecified: Secondary | ICD-10-CM | POA: Diagnosis not present

## 2017-08-05 DIAGNOSIS — H02889 Meibomian gland dysfunction of unspecified eye, unspecified eyelid: Secondary | ICD-10-CM | POA: Diagnosis not present

## 2017-08-05 DIAGNOSIS — H2513 Age-related nuclear cataract, bilateral: Secondary | ICD-10-CM | POA: Diagnosis not present

## 2017-08-05 DIAGNOSIS — H1859 Other hereditary corneal dystrophies: Secondary | ICD-10-CM | POA: Diagnosis not present

## 2017-08-05 DIAGNOSIS — G459 Transient cerebral ischemic attack, unspecified: Secondary | ICD-10-CM | POA: Diagnosis not present

## 2017-08-05 DIAGNOSIS — H35371 Puckering of macula, right eye: Secondary | ICD-10-CM | POA: Diagnosis not present

## 2017-08-06 ENCOUNTER — Telehealth: Payer: Self-pay | Admitting: Diagnostic Neuroimaging

## 2017-08-06 ENCOUNTER — Encounter: Payer: BLUE CROSS/BLUE SHIELD | Admitting: Speech Pathology

## 2017-08-06 DIAGNOSIS — G453 Amaurosis fugax: Secondary | ICD-10-CM

## 2017-08-06 NOTE — Telephone Encounter (Signed)
Spoke with patient who stated his eye dr told him he was at high risk for TIA, stroke due to recent symptoms he has experienced. Patient saw Dr Stefani Dama, [facility] at [facility]. He stated the eye dr mentioned he may need ot have his arteries in his neck checked, which he has had in the past. The patient reported that the "event " occurred in his right eye only, was like a shade coming down, and he couldn't see out of top of his eye. He also saw little squares, the event lasted 1-2 min. Yesterday he saw squares in his right eye only, and it lasted a few minutes. After each event he felt a little dizzy and had discomfort in the back of his head/neck. The patient stated Dr Arville Lime did labs. Patient wants to know if he should see Dr Marjory Lies or PCP. This RN advised will let Dr Marjory Lies know and call him back later this afternoon. This RN reviewed signs, symptoms of stroke with patient and advised he call 911 if he experiences any of them. Patient verbalized understanding, appreciation.

## 2017-08-06 NOTE — Telephone Encounter (Signed)
I ordered carotid u/s test. -VRP

## 2017-08-06 NOTE — Telephone Encounter (Signed)
Pt called stating he had "events" in his right eye. Stating his eye doctor requested he f/u with a physician due to risks of TIA or mini strokes. Pt requesting a call to discuss if this would be something to see Dr. Marjory Lies for, pt didn't want to go into detail with me nor schedule an appt without speak with the RN.

## 2017-08-06 NOTE — Addendum Note (Signed)
Addended by: Joycelyn Schmid R on: 08/06/2017 12:23 PM   Modules accepted: Orders

## 2017-08-06 NOTE — Telephone Encounter (Signed)
LVM informing patient that Dr Marjory Lies ordered a carotid ultra sound as his eye dr suggested. Advised he should get a call in a few days to schedule the test. Advised Dr Marjory Lies will review carotid US results and make decision on further actions as needed. Left number for any questions.

## 2017-08-06 NOTE — Telephone Encounter (Signed)
Dr. Arville Lime called me to discuss patient. Patient may have had right eye amaurosis fugax this passt Monday.  I ordered carotid u/s. Patient will start aspirin  daily.  I will see patient in clinic after ultrasound completed.   -VRP

## 2017-08-10 ENCOUNTER — Ambulatory Visit (INDEPENDENT_AMBULATORY_CARE_PROVIDER_SITE_OTHER): Payer: Medicare Other | Admitting: Orthopedic Surgery

## 2017-08-10 ENCOUNTER — Ambulatory Visit: Payer: BLUE CROSS/BLUE SHIELD | Admitting: Speech Pathology

## 2017-08-10 DIAGNOSIS — R41841 Cognitive communication deficit: Secondary | ICD-10-CM

## 2017-08-10 NOTE — Patient Instructions (Signed)
Signs of a stroke  B - Balance Is the person suddenly having trouble with balance or coordination?  E - Eyes Is the person experiencing suddenly blurred or double vision or a sudden loss of vision in one or both eyes?  F - Face Drooping Does one side of the face droop or is it numb? Ask the person to smile.  A- Arm Weakness Is one arm weak or numb? Ask the person to raise both arms. Does one arm drift downward?  S - Speech Difficulty Is speech slurred, are they unable to speak, or are they hard to understand? Ask the person to repeat a simple sentence like, "The sky is blue." Is the sentence repeated correctly?  T - Time to call 9-1-1 If the person shows any of these symptoms, even if the symptoms go away, call 9-1-1 and get them to the hospital immediately

## 2017-08-10 NOTE — Therapy (Signed)
[facility] [address] [place], [place], [address] Phone: [phone]   Fax:  [phone]  Speech Language Pathology Treatment  Patient Details  Name: Lee Nelson MRN: [id number] Date of Birth: [birth year] Referring Provider: Dr. Joycelyn Schmid   Encounter Date: 08/10/2017  End of Session - 08/10/17 1301    Visit Number  6    Number of Visits  17    Date for SLP Re-Evaluation  08/24/17    SLP Start Time  1016    SLP Stop Time   1100    SLP Time Calculation (min)  44 min    Activity Tolerance  Patient tolerated treatment well       Past Medical History:  Diagnosis Date  . Anxiety   . Arthritis   . Bladder calculus   . BPH (benign prostatic hyperplasia)   . Chronic constipation   . Complication of anesthesia    " I had some coughing afterwards for a couple of days"--  per pt "perfers spinal anesthesia since general anesthesia congnitive issues when older"  . Diverticulosis of colon   . Dry eye syndrome of both eyes   . Environmental allergies   . GERD (gastroesophageal reflux disease)    occasional  . History of adenomatous polyp of colon    08/ 2004  . History of kidney stones   . History of squamous cell carcinoma in situ (SCCIS) of skin    s/p  excision 2013 facial areas and 06/ 2016 nose  . Migraine    eye migraine occasional  . Seasonal and perennial allergic rhinitis   . Thrombocytopenia (HCC)   . Tingling    hands and feet bilat , intermittantly-- per pt has lumbar bulging disk  . Urinary frequency   . Vocal fold atrophy    dysphonia-- per pt has to drink large amount of water to take even on pill  . Wears glasses     Past Surgical History:  Procedure Laterality Date  . APPENDECTOMY  child  . CARDIOVASCULAR STRESS TEST  05-17-2015  dr hilty   Low risk nuclear study w/ no ischemia/  normal LV function and wall motion , stress ef 54% (lvef 45-54%)  . CHOLECYSTECTOMY N/A 09/29/2014   Procedure: LAPAROSCOPIC CHOLECYSTECTOMY WITH INTRAOPERATIVE CHOLANGIOGRAM;  Surgeon: Ovidio Kin, MD;  Location: [facility];  Service: General;  Laterality: N/A;  . COLONOSCOPY  last one 09-06-2010  . ESOPHAGOGASTRODUODENOSCOPY N/A 12/09/2013   Procedure: ESOPHAGOGASTRODUODENOSCOPY (EGD);  Surgeon: Willis Modena, MD;  Location: [facility] ENDOSCOPY;  Service: Endoscopy;  Laterality: N/A;  . EXTRACORPOREAL SHOCK WAVE LITHOTRIPSY  yrs ago  . INGUINAL HERNIA REPAIR Left child  . NISSEN FUNDOPLICATION  1980's   open  . STONE EXTRACTION WITH BASKET N/A 08/18/2016   Procedure: STONE EXTRACTION WITH BASKET;  Surgeon: Jethro Bolus, MD;  Location: [facility];  Service: Urology;  Laterality: N/A;  . THULIUM LASER TURP (TRANSURETHRAL RESECTION OF PROSTATE) N/A 08/18/2016   Procedure: THULIUM LASER BLADDER NECK INCISION AND BLADDER STONE REMOVAL;  Surgeon: Jethro Bolus, MD;  Location: [facility];  Service: Urology;  Laterality: N/A;  . TONSILLECTOMY  child  . TRANSTHORACIC ECHOCARDIOGRAM  11/18/2010   grade 1 diastolic dysfunction, ef 55-60%/  trivial MR and TR/ mild dilated RA    There were no vitals filed for this visit.  Subjective Assessment - 08/10/17 1018    Subjective  Pt reported episode of visual changes last week; undergoing w/u for TIA.  Currently in Pain?  Yes    Pain Score  1     Pain Location  Neck    Effect of Pain on Daily Activities  hard to turn head            ADULT SLP TREATMENT - 08/10/17 1016      General Information   Behavior/Cognition  Alert;Cooperative;Pleasant mood      Treatment Provided   Treatment provided  Cognitive-Linquistic      Pain Assessment   Pain Assessment  No/denies pain      Cognitive-Linquistic Treatment   Treatment focused on  Cognition    Skilled Treatment  Pt told SLP about visiting ophthalmologist after experiencing an episode with visual changes, told SLP MD is w/u for TIA. SLP educated pt re: stroke  symptoms, provided handout and verbal education re: BE FAST and importance of seeking immediate medical attention if he experiences one of the symptoms. In semantic tasks (definitions, analogies), pt required min-mod cues. In mod complex conversation, min question cues required x 2 for anomia.      Assessment / Recommendations / Plan   Plan  Continue with current plan of care      Progression Toward Goals   Progression toward goals  Progressing toward goals       SLP Education - 08/10/17 1300    Education provided  Yes    Education Details  BE FAST, seek immediate medical attention if experiencing stroke symptoms    Person(s) Educated  Patient    Methods  Explanation;Handout    Comprehension  Verbalized understanding       SLP Short Term Goals - 07/30/17 1019      SLP SHORT TERM GOAL #1   Title  Pt will complete complex naming tasks with 90% accuracy and rare min A    Time  3    Period  Weeks    Status  On-going      SLP SHORT TERM GOAL #2   Title  Pt will utilize compensations for dysnomia in structured tasks with rare min A over 2 sessions.    Time  3    Period  Weeks    Status  On-going      SLP SHORT TERM GOAL #3   Title  Pt will report carryover of 2 compensations for memory/attention outside of therapy over 2 sessions     Time  3    Period  Weeks    Status  On-going      SLP SHORT TERM GOAL #4   Title  Pt will divide attention between 2 simple cognitive linguistic tasks with 85% on each and occasional min A     Time  3    Period  Weeks    Status  On-going       SLP Long Term Goals - 07/30/17 1020      SLP LONG TERM GOAL #1   Title  Pt will utilize compensations for dysnomia in complex conversation over 10 minutes as needed with rare min A    Time  7    Period  Weeks    Status  On-going      SLP LONG TERM GOAL #2   Title  Pt will report carrover of 4 compensatory strategies for attention/memory outside of therapy over 2 sessions    Time  7    Period   Weeks    Status  On-going       Plan - 08/10/17 1306  Clinical Impression Statement  Pt continues to complain of word-finding difficulties that occur daily, SLP noted 2 instances today, for which pt required min A verbal cues. 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 Maciver reports significant emotional and financial stress around this. Stress may also adversely affect cognition. Min-mod question cues required in semantic tasks today; some difficulties do appear linguistic in nature, though it appears impaired cognition (abstraction, reasoning, attention) are significant contributors. Continue skilled ST to maximize attention, word finding and train in compensations for attention, memory and word finding for continue success at work and independence at home.     Speech Therapy Frequency  2x / week    Treatment/Interventions  Cueing hierarchy;Environmental controls;Language facilitation;Compensatory techniques;Cognitive reorganization;Functional tasks;SLP instruction and feedback;Patient/family education;Internal/external aids    Potential to Achieve Goals  Good    Potential Considerations  Family/community support    Consulted and Agree with Plan of Care  Patient       Patient will benefit from skilled therapeutic intervention in order to improve the following deficits and impairments:   Cognitive communication deficit    Problem List Patient Active Problem List   Diagnosis Date Noted  . PSVT (paroxysmal supraventricular tachycardia) (HCC) 09/12/2015  . Chest pain 05/03/2015  . Dyspnea 05/03/2015  . ALLERGIC RHINITIS 09/21/2007  . G E R D 09/21/2007  . SMOKE INHALATION 09/21/2007  . OSTEOPOROSIS 01/28/2007  . PALPITATIONS 01/28/2007  . COUGH 01/28/2007  . ALLERGY 01/28/2007   Rondel Baton, MS, CCC-SLP Speech-Language Pathologist  Arlana Lindau 08/10/2017, 1:19 PM  [facility] [address] [place], [place], [address] Phone: [phone]   Fax:  [phone]   Name: ARHAAN CHESNUT MRN: [id number] Date of Birth: May 04, 1940

## 2017-08-11 ENCOUNTER — Ambulatory Visit (HOSPITAL_COMMUNITY)
Admission: RE | Admit: 2017-08-11 | Discharge: 2017-08-11 | Disposition: A | Discharge status: Home / Self Care | Payer: BLUE CROSS/BLUE SHIELD | Source: Ambulatory Visit | Attending: Cardiovascular Disease | Admitting: Cardiovascular Disease

## 2017-08-11 DIAGNOSIS — G453 Amaurosis fugax: Secondary | ICD-10-CM | POA: Diagnosis not present

## 2017-08-12 ENCOUNTER — Ambulatory Visit: Payer: BLUE CROSS/BLUE SHIELD

## 2017-08-12 DIAGNOSIS — M6281 Muscle weakness (generalized): Secondary | ICD-10-CM | POA: Diagnosis not present

## 2017-08-12 DIAGNOSIS — M62838 Other muscle spasm: Secondary | ICD-10-CM | POA: Diagnosis not present

## 2017-08-12 DIAGNOSIS — R41841 Cognitive communication deficit: Secondary | ICD-10-CM | POA: Diagnosis not present

## 2017-08-12 DIAGNOSIS — R3915 Urgency of urination: Secondary | ICD-10-CM | POA: Diagnosis not present

## 2017-08-12 DIAGNOSIS — R3912 Poor urinary stream: Secondary | ICD-10-CM | POA: Diagnosis not present

## 2017-08-12 NOTE — Therapy (Signed)
[facility] [address] [place], [place], [address] Phone: [phone]   Fax:  [phone]  Speech Language Pathology Treatment  Patient Details  Name: Lee Nelson MRN: [id number] Date of Birth: 06/16/1940 Referring Provider: Dr. Joycelyn Schmid   Encounter Date: 08/12/2017  End of Session - 08/12/17 1632    Visit Number  7    Number of Visits  17    Date for SLP Re-Evaluation  08/24/17    SLP Start Time  1533    SLP Stop Time   1617    SLP Time Calculation (min)  44 min    Activity Tolerance  Patient tolerated treatment well       Past Medical History:  Diagnosis Date  . Anxiety   . Arthritis   . Bladder calculus   . BPH (benign prostatic hyperplasia)   . Chronic constipation   . Complication of anesthesia    " I had some coughing afterwards for a couple of days"--  per pt "perfers spinal anesthesia since general anesthesia congnitive issues when older"  . Diverticulosis of colon   . Dry eye syndrome of both eyes   . Environmental allergies   . GERD (gastroesophageal reflux disease)    occasional  . History of adenomatous polyp of colon    08/ 2004  . History of kidney stones   . History of squamous cell carcinoma in situ (SCCIS) of skin    s/p  excision 2013 facial areas and 06/ 2016 nose  . Migraine    eye migraine occasional  . Seasonal and perennial allergic rhinitis   . Thrombocytopenia (HCC)   . Tingling    hands and feet bilat , intermittantly-- per pt has lumbar bulging disk  . Urinary frequency   . Vocal fold atrophy    dysphonia-- per pt has to drink large amount of water to take even on pill  . Wears glasses     Past Surgical History:  Procedure Laterality Date  . APPENDECTOMY  child  . CARDIOVASCULAR STRESS TEST  05-17-2015  dr hilty   Low risk nuclear study w/ no ischemia/  normal LV function and wall motion , stress ef 54% (lvef 45-54%)  . CHOLECYSTECTOMY N/A 09/29/2014    Procedure: LAPAROSCOPIC CHOLECYSTECTOMY WITH INTRAOPERATIVE CHOLANGIOGRAM;  Surgeon: Ovidio Kin, MD;  Location: [facility];  Service: General;  Laterality: N/A;  . COLONOSCOPY  last one 09-06-2010  . ESOPHAGOGASTRODUODENOSCOPY N/A 12/09/2013   Procedure: ESOPHAGOGASTRODUODENOSCOPY (EGD);  Surgeon: Willis Modena, MD;  Location: [facility];  Service: Endoscopy;  Laterality: N/A;  . EXTRACORPOREAL SHOCK WAVE LITHOTRIPSY  yrs ago  . INGUINAL HERNIA REPAIR Left child  . NISSEN FUNDOPLICATION  1980's   open  . STONE EXTRACTION WITH BASKET N/A 08/18/2016   Procedure: STONE EXTRACTION WITH BASKET;  Surgeon: Jethro Bolus, MD;  Location: [facility];  Service: Urology;  Laterality: N/A;  . THULIUM LASER TURP (TRANSURETHRAL RESECTION OF PROSTATE) N/A 08/18/2016   Procedure: THULIUM LASER BLADDER NECK INCISION AND BLADDER STONE REMOVAL;  Surgeon: Jethro Bolus, MD;  Location: [facility];  Service: Urology;  Laterality: N/A;  . TONSILLECTOMY  child  . TRANSTHORACIC ECHOCARDIOGRAM  11/18/2010   grade 1 diastolic dysfunction, ef 55-60%/  trivial MR and TR/ mild dilated RA    There were no vitals filed for this visit.  Subjective Assessment - 08/12/17 1536    Subjective  Upon entry to ST room pt sat in opposite chair necessary  to participate in table top tasks.    Currently in Pain?  No/denies            ADULT SLP TREATMENT - 08/12/17 1553      General Information   Behavior/Cognition  Alert;Cooperative;Pleasant mood      Treatment Provided   Treatment provided  Cognitive-Linquistic      Cognitive-Linquistic Treatment   Treatment focused on  Cognition    Skilled Treatment  In cognitive linguistic tasks involving semantics (divergent naming, responsive naming, convergent naming) pt required min-mod cues. Pt politely argumentative re: some answers. On his homework, pt left blank some responses SLP would have thought pt could have generated in his past,  given his education level and occupation.       Assessment / Recommendations / Plan   Plan  Continue with current plan of care      Progression Toward Goals   Progression toward goals  Progressing toward goals         SLP Short Term Goals - 08/12/17 1635      SLP SHORT TERM GOAL #1   Title  Pt will complete complex naming tasks with 90% accuracy and rare min A    Time  2    Period  Weeks    Status  On-going      SLP SHORT TERM GOAL #2   Title  Pt will utilize compensations for dysnomia in structured tasks with rare min A over 2 sessions.    Time  2    Period  Weeks    Status  On-going      SLP SHORT TERM GOAL #3   Title  Pt will report carryover of 2 compensations for memory/attention outside of therapy over 2 sessions     Time  2    Period  Weeks    Status  On-going      SLP SHORT TERM GOAL #4   Title  Pt will divide attention between 2 simple cognitive linguistic tasks with 85% on each and occasional min A     Time  2    Period  Weeks    Status  On-going       SLP Long Term Goals - 08/12/17 1636      SLP LONG TERM GOAL #1   Title  Pt will utilize compensations for dysnomia in complex conversation over 10 minutes as needed with rare min A    Time  6    Period  Weeks    Status  On-going      SLP LONG TERM GOAL #2   Title  Pt will report carrover of 4 compensatory strategies for attention/memory outside of therapy over 2 sessions    Time  6    Period  Weeks    Status  On-going       Plan - 08/12/17 1633    Clinical Impression Statement  Pt continues to complain of word-finding difficulties that occur daily, SLP nneeded to cue pt to use compensations for rare anomic episodes occurring today (x2).  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 reports significant emotional and financial stress around this. Stress may also adversely affect cognition. SLP  believes pt's impaired cognition (abstraction, reasoning, attention) negatively impact any anomia pt may experience. Continue skilled ST to maximize attention, word finding and train in compensations for attention, memory and word finding for continue  success at work and independence at home.     Speech Therapy Frequency  2x / week    Duration  -- 8 weeks/17 visits    Treatment/Interventions  Cueing hierarchy;Environmental controls;Language facilitation;Compensatory techniques;Cognitive reorganization;Functional tasks;SLP instruction and feedback;Patient/family education;Internal/external aids    Potential to Achieve Goals  Good    Potential Considerations  Family/community support       Patient will benefit from skilled therapeutic intervention in order to improve the following deficits and impairments:   Cognitive communication deficit    Problem List Patient Active Problem List   Diagnosis Date Noted  . PSVT (paroxysmal supraventricular tachycardia) (HCC) 09/12/2015  . Chest pain 05/03/2015  . Dyspnea 05/03/2015  . ALLERGIC RHINITIS 09/21/2007  . G E R D 09/21/2007  . SMOKE INHALATION 09/21/2007  . OSTEOPOROSIS 01/28/2007  . PALPITATIONS 01/28/2007  . COUGH 01/28/2007  . ALLERGY 01/28/2007    [facility] ,MS, CCC-SLP  08/12/2017, 4:36 PM  [facility] [address] [place], [place], [address] Phone: [phone]   Fax:  [phone]   Name: AMER ALCINDOR MRN: [id number] Date of Birth: 02-May-1940

## 2017-08-12 NOTE — Patient Instructions (Signed)
  Please complete the assigned speech therapy homework prior to your next session and return it to the speech therapist at your next visit.  

## 2017-08-13 ENCOUNTER — Encounter: Payer: BLUE CROSS/BLUE SHIELD | Admitting: Speech Pathology

## 2017-08-14 ENCOUNTER — Telehealth: Payer: Self-pay | Admitting: *Deleted

## 2017-08-14 NOTE — Telephone Encounter (Signed)
Pt called back.  Does he need to see you sooner, or keep 10/2017 appt.  He had question about taking aspirin  (need to) or his other meds adequate enough.  He then asked about CRP 10.9 level  (normal <5). that he had in [facility] when in 08-06-17.  I told him to speak to pcp.

## 2017-08-14 NOTE — Telephone Encounter (Signed)
LMVM for pt home # (ok per DPR) that carotid doppler ultrasound was unremarkable, no major findings or narrowing.  Continue current plan.  Please return call if questions.

## 2017-08-14 NOTE — Telephone Encounter (Signed)
-----   Message from Suanne Marker, MD sent at 08/13/2017  3:19 PM EDT ----- Unremarkable study. No major findings or stenosis. Please call patient. Continue current plan. -VRP

## 2017-08-17 ENCOUNTER — Ambulatory Visit (INDEPENDENT_AMBULATORY_CARE_PROVIDER_SITE_OTHER): Payer: Medicare Other

## 2017-08-17 ENCOUNTER — Ambulatory Visit (INDEPENDENT_AMBULATORY_CARE_PROVIDER_SITE_OTHER): Payer: BLUE CROSS/BLUE SHIELD | Admitting: Orthopedic Surgery

## 2017-08-17 ENCOUNTER — Ambulatory Visit (INDEPENDENT_AMBULATORY_CARE_PROVIDER_SITE_OTHER): Payer: BLUE CROSS/BLUE SHIELD

## 2017-08-17 ENCOUNTER — Ambulatory Visit: Payer: BLUE CROSS/BLUE SHIELD | Admitting: Speech Pathology

## 2017-08-17 ENCOUNTER — Ambulatory Visit (INDEPENDENT_AMBULATORY_CARE_PROVIDER_SITE_OTHER): Payer: Self-pay

## 2017-08-17 DIAGNOSIS — M79672 Pain in left foot: Secondary | ICD-10-CM | POA: Diagnosis not present

## 2017-08-17 DIAGNOSIS — M25511 Pain in right shoulder: Secondary | ICD-10-CM

## 2017-08-17 DIAGNOSIS — M25551 Pain in right hip: Secondary | ICD-10-CM | POA: Diagnosis not present

## 2017-08-17 DIAGNOSIS — M79671 Pain in right foot: Secondary | ICD-10-CM

## 2017-08-17 DIAGNOSIS — M25512 Pain in left shoulder: Secondary | ICD-10-CM | POA: Diagnosis not present

## 2017-08-17 DIAGNOSIS — R41841 Cognitive communication deficit: Secondary | ICD-10-CM | POA: Diagnosis not present

## 2017-08-17 NOTE — Therapy (Signed)
[facility] [address] [place], [place], [address] Phone: [phone]   Fax:  [phone]  Speech Language Pathology Treatment  Patient Details  Name: SIGURD PUGH MRN: [id number] Date of Birth: 1941/03/09 Referring Provider: Dr. Joycelyn Schmid   Encounter Date: 08/17/2017  End of Session - 08/17/17 1152    Visit Number  8    Number of Visits  17    Date for SLP Re-Evaluation  08/24/17    SLP Start Time  1102    SLP Stop Time   1144    SLP Time Calculation (min)  42 min    Activity Tolerance  Patient tolerated treatment well       Past Medical History:  Diagnosis Date  . Anxiety   . Arthritis   . Bladder calculus   . BPH (benign prostatic hyperplasia)   . Chronic constipation   . Complication of anesthesia    " I had some coughing afterwards for a couple of days"--  per pt "perfers spinal anesthesia since general anesthesia congnitive issues when older"  . Diverticulosis of colon   . Dry eye syndrome of both eyes   . Environmental allergies   . GERD (gastroesophageal reflux disease)    occasional  . History of adenomatous polyp of colon    08/ 2004  . History of kidney stones   . History of squamous cell carcinoma in situ (SCCIS) of skin    s/p  excision 2013 facial areas and 06/ 2016 nose  . Migraine    eye migraine occasional  . Seasonal and perennial allergic rhinitis   . Thrombocytopenia (HCC)   . Tingling    hands and feet bilat , intermittantly-- per pt has lumbar bulging disk  . Urinary frequency   . Vocal fold atrophy    dysphonia-- per pt has to drink large amount of water to take even on pill  . Wears glasses     Past Surgical History:  Procedure Laterality Date  . APPENDECTOMY  child  . CARDIOVASCULAR STRESS TEST  05-17-2015  dr hilty   Low risk nuclear study w/ no ischemia/  normal LV function and wall motion , stress ef 54% (lvef 45-54%)  . CHOLECYSTECTOMY N/A 09/29/2014   Procedure: LAPAROSCOPIC CHOLECYSTECTOMY WITH INTRAOPERATIVE CHOLANGIOGRAM;  Surgeon: Ovidio Kin, MD;  Location: [facility];  Service: General;  Laterality: N/A;  . COLONOSCOPY  last one 09-06-2010  . ESOPHAGOGASTRODUODENOSCOPY N/A 12/09/2013   Procedure: ESOPHAGOGASTRODUODENOSCOPY (EGD);  Surgeon: Willis Modena, MD;  Location: [facility] ENDOSCOPY;  Service: Endoscopy;  Laterality: N/A;  . EXTRACORPOREAL SHOCK WAVE LITHOTRIPSY  yrs ago  . INGUINAL HERNIA REPAIR Left child  . NISSEN FUNDOPLICATION  1980's   open  . STONE EXTRACTION WITH BASKET N/A 08/18/2016   Procedure: STONE EXTRACTION WITH BASKET;  Surgeon: Jethro Bolus, MD;  Location: [facility];  Service: Urology;  Laterality: N/A;  . THULIUM LASER TURP (TRANSURETHRAL RESECTION OF PROSTATE) N/A 08/18/2016   Procedure: THULIUM LASER BLADDER NECK INCISION AND BLADDER STONE REMOVAL;  Surgeon: Jethro Bolus, MD;  Location: [facility];  Service: Urology;  Laterality: N/A;  . TONSILLECTOMY  child  . TRANSTHORACIC ECHOCARDIOGRAM  11/18/2010   grade 1 diastolic dysfunction, ef 55-60%/  trivial MR and TR/ mild dilated RA    There were no vitals filed for this visit.  Subjective Assessment - 08/17/17 1104    Subjective  "I almost forgot you today."    Currently in Pain?  Yes    Pain Score  2     Pain Location  Teeth    Pain Orientation  Right    Pain Descriptors / Indicators  Aching            ADULT SLP TREATMENT - 08/17/17 1102      General Information   Behavior/Cognition  Alert;Cooperative;Pleasant mood    Patient Positioning  Upright in chair      Treatment Provided   Treatment provided  Cognitive-Linquistic      Cognitive-Linquistic Treatment   Treatment focused on  Cognition    Skilled Treatment  In cognitive linguistic tasks involving semantics (divergent naming, responsive naming, convergent naming) pt required min-mod cues. Pt politely argumentative re: some answers. In specific naming  tasks with 2-3 letters provided, pt 100% accurate when letters given were word-initial; mod A required when letters provided were in the middle or at the end of a word. Pt seemed surprised he was having difficulty with this task. 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 SLP encouraged pt to take rest breaks to improve his focus. SLP worked with pt on compensations for working memory in card sorting task (verbalization); divided attention 85% accuracy, improves with use of compensations.       Assessment / Recommendations / Plan   Plan  Continue with current plan of care      Progression Toward Goals   Progression toward goals  Progressing toward goals       SLP Education - 08/17/17 1152    Education provided  Yes    Education Details  memory, attention strategies    Person(s) Educated  Patient    Methods  Explanation;Verbal cues    Comprehension  Returned demonstration;Need further instruction       SLP Short Term Goals - 08/17/17 1118      SLP SHORT TERM GOAL #1   Title  Pt will complete complex naming tasks with 90% accuracy and rare min A    Time  1    Period  Weeks    Status  On-going      SLP SHORT TERM GOAL #2   Title  Pt will utilize compensations for dysnomia in structured tasks with rare min A over 2 sessions.    Time  1    Period  Weeks    Status  On-going      SLP SHORT TERM GOAL #3   Title  Pt will report carryover of 2 compensations for memory/attention outside of therapy over 2 sessions     Time  1    Period  Weeks    Status  On-going      SLP SHORT TERM GOAL #4   Title  Pt will divide attention between 2 simple cognitive linguistic tasks with 85% on each and occasional min A     Time  1    Period  Weeks    Status  On-going       SLP Long Term Goals - 08/17/17 1154      SLP LONG TERM GOAL #1   Title  Pt will utilize  compensations for dysnomia in complex conversation over 10 minutes as needed with rare min A    Time  5    Period  Weeks    Status  On-going      SLP LONG TERM GOAL #2  Title  Pt will report carrover of 4 compensatory strategies for attention/memory outside of therapy over 2 sessions    Time  5    Period  Weeks    Status  On-going       Plan - 08/17/17 1153    Clinical Impression Statement  Pt continues to complain of word-finding difficulties that occur daily. 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 Michiels reports significant emotional and financial stress around this. Stress may also adversely affect cognition. SLP believes pt's impaired cognition (abstraction, reasoning, attention) negatively impact any anomia pt may experience. Continue skilled ST to maximize attention, word finding and train in compensations for attention, memory and word finding for continue success at work and independence at home.     Speech Therapy Frequency  2x / week    Treatment/Interventions  Cueing hierarchy;Environmental controls;Language facilitation;Compensatory techniques;Cognitive reorganization;Functional tasks;SLP instruction and feedback;Patient/family education;Internal/external aids    Potential to Achieve Goals  Good    Potential Considerations  Family/community support    Consulted and Agree with Plan of Care  Patient       Patient will benefit from skilled therapeutic intervention in order to improve the following deficits and impairments:   Cognitive communication deficit    Problem List Patient Active Problem List   Diagnosis Date Noted  . PSVT (paroxysmal supraventricular tachycardia) (HCC) 09/12/2015  . Chest pain 05/03/2015  . Dyspnea 05/03/2015  . ALLERGIC RHINITIS 09/21/2007  . G E R D 09/21/2007  . SMOKE INHALATION 09/21/2007  . OSTEOPOROSIS 01/28/2007  . PALPITATIONS  01/28/2007  . COUGH 01/28/2007  . ALLERGY 01/28/2007   Rondel Baton, MS, CCC-SLP Speech-Language Pathologist  Arlana Lindau 08/17/2017, 11:54 AM  [facility] [address] [place], [place], [address] Phone: [phone]   Fax:  [phone]   Name: REMUS HAGEDORN MRN: [id number] Date of Birth: 01-24-1941

## 2017-08-17 NOTE — Telephone Encounter (Signed)
Called and pt not at the home.   Caregiver for wife answered.  I will call him back.

## 2017-08-17 NOTE — Telephone Encounter (Signed)
I spoke with pt and relayed that Per Dr Marjory Lies would keep the 10/2017 appt and would take the aspirin  daily.  CRP level is unremarkable at that level and could reach out to pcp who ordered this.  He verbalized understanding.

## 2017-08-18 DIAGNOSIS — R3915 Urgency of urination: Secondary | ICD-10-CM | POA: Diagnosis not present

## 2017-08-18 DIAGNOSIS — N401 Enlarged prostate with lower urinary tract symptoms: Secondary | ICD-10-CM | POA: Diagnosis not present

## 2017-08-18 DIAGNOSIS — R350 Frequency of micturition: Secondary | ICD-10-CM | POA: Diagnosis not present

## 2017-08-20 ENCOUNTER — Ambulatory Visit: Payer: BLUE CROSS/BLUE SHIELD | Admitting: Speech Pathology

## 2017-08-20 DIAGNOSIS — R41841 Cognitive communication deficit: Secondary | ICD-10-CM | POA: Diagnosis not present

## 2017-08-20 NOTE — Therapy (Signed)
[facility] [address] [place], [place], [address] Phone: [phone]   Fax:  [phone]  Speech Language Pathology Treatment  Patient Details  Name: Lee Nelson MRN: [id number] Date of Birth: Sep 14, 1940 Referring Provider: Dr. Joycelyn Schmid   Encounter Date: 08/20/2017  End of Session - 08/20/17 1254    Visit Number  9    Number of Visits  17    Date for SLP Re-Evaluation  08/24/17    SLP Start Time  1018    SLP Stop Time   1107    SLP Time Calculation (min)  49 min    Activity Tolerance  Patient tolerated treatment well       Past Medical History:  Diagnosis Date  . Anxiety   . Arthritis   . Bladder calculus   . BPH (benign prostatic hyperplasia)   . Chronic constipation   . Complication of anesthesia    " I had some coughing afterwards for a couple of days"--  per pt "perfers spinal anesthesia since general anesthesia congnitive issues when older"  . Diverticulosis of colon   . Dry eye syndrome of both eyes   . Environmental allergies   . GERD (gastroesophageal reflux disease)    occasional  . History of adenomatous polyp of colon    08/ 2004  . History of kidney stones   . History of squamous cell carcinoma in situ (SCCIS) of skin    s/p  excision 2013 facial areas and 06/ 2016 nose  . Migraine    eye migraine occasional  . Seasonal and perennial allergic rhinitis   . Thrombocytopenia (HCC)   . Tingling    hands and feet bilat , intermittantly-- per pt has lumbar bulging disk  . Urinary frequency   . Vocal fold atrophy    dysphonia-- per pt has to drink large amount of water to take even on pill  . Wears glasses     Past Surgical History:  Procedure Laterality Date  . APPENDECTOMY  child  . CARDIOVASCULAR STRESS TEST  05-17-2015  dr hilty   Low risk nuclear study w/ no ischemia/  normal LV function and wall motion , stress ef 54% (lvef 45-54%)  . CHOLECYSTECTOMY N/A 09/29/2014   Procedure: LAPAROSCOPIC CHOLECYSTECTOMY WITH INTRAOPERATIVE CHOLANGIOGRAM;  Surgeon: Ovidio Kin, MD;  Location: [facility];  Service: General;  Laterality: N/A;  . COLONOSCOPY  last one 09-06-2010  . ESOPHAGOGASTRODUODENOSCOPY N/A 12/09/2013   Procedure: ESOPHAGOGASTRODUODENOSCOPY (EGD);  Surgeon: Willis Modena, MD;  Location: [facility] ENDOSCOPY;  Service: Endoscopy;  Laterality: N/A;  . EXTRACORPOREAL SHOCK WAVE LITHOTRIPSY  yrs ago  . INGUINAL HERNIA REPAIR Left child  . NISSEN FUNDOPLICATION  1980's   open  . STONE EXTRACTION WITH BASKET N/A 08/18/2016   Procedure: STONE EXTRACTION WITH BASKET;  Surgeon: Jethro Bolus, MD;  Location: [facility];  Service: Urology;  Laterality: N/A;  . THULIUM LASER TURP (TRANSURETHRAL RESECTION OF PROSTATE) N/A 08/18/2016   Procedure: THULIUM LASER BLADDER NECK INCISION AND BLADDER STONE REMOVAL;  Surgeon: Jethro Bolus, MD;  Location: [facility];  Service: Urology;  Laterality: N/A;  . TONSILLECTOMY  child  . TRANSTHORACIC ECHOCARDIOGRAM  11/18/2010   grade 1 diastolic dysfunction, ef 55-60%/  trivial MR and TR/ mild dilated RA    There were no vitals filed for this visit.  Subjective Assessment - 08/20/17 1021    Subjective  "I had difficulty with some of these."    Currently in  Pain?  Yes    Pain Score  2     Pain Location  Back    Pain Orientation  Lower    Pain Type  Chronic pain    Pain Onset  More than a month ago            ADULT SLP TREATMENT - 08/20/17 1018      General Information   Behavior/Cognition  Alert;Cooperative;Pleasant mood      Treatment Provided   Treatment provided  Cognitive-Linquistic      Cognitive-Linquistic Treatment   Treatment focused on  Cognition    Skilled Treatment  Complex specific naming tasks 85% complete at home, pt with 100% accuracy with rare min A (SLP encouraged pt to describe or "talk about" items he left blank). Pt with questions re: progress and plan of  care. SLP provided education re: progress toward short term goals and transition to focus on long-term goals in future sessions. Pt independently used strategy for recall, taking notes on long-term goals as SLP explained. 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 In mod complex conversation, no dysnomia/anomia noted today; speech WFL. Pt requesting cognitive activities he can do at home. He states he was bothered by the fact that a book came up in conversation recently and he did not recall reading it with his book club (~1 year ago). SLP encouraged pt to take notes while reading his new book for book club. Pt also to listen or watch news stories and discuss/summarize later with his wife.       Assessment / Recommendations / Plan   Plan  Continue with current plan of care      Progression Toward Goals   Progression toward goals  Progressing toward goals       SLP Education - 08/20/17 1254    Education provided  Yes    Education Details  memory, attention strategies    Person(s) Educated  Patient    Methods  Explanation;Verbal cues    Comprehension  Returned demonstration;Need further instruction       SLP Short Term Goals - 08/20/17 1032      SLP SHORT TERM GOAL #1   Title  Pt will complete complex naming tasks with 90% accuracy and rare min A    Time  1    Period  Weeks    Status  Achieved      SLP SHORT TERM GOAL #2   Title  Pt will utilize compensations for dysnomia in structured tasks with rare min A over 2 sessions.    Time  1    Period  Weeks    Status  Achieved      SLP SHORT TERM GOAL #3   Title  Pt will report carryover of 2 compensations for memory/attention outside of therapy over 2 sessions     Time  1    Period  Weeks    Status  Achieved      SLP SHORT TERM GOAL #4   Title  Pt will divide attention between 2 simple cognitive linguistic tasks with 85% on each and occasional min A     Time  1    Period  Weeks    Status   Achieved       SLP Long Term Goals - 08/20/17 1058      SLP LONG TERM GOAL #1   Title  Pt will utilize compensations for dysnomia in complex conversation over 10  minutes as needed with rare min A    Time  5    Period  Weeks    Status  On-going      SLP LONG TERM GOAL #2   Title  Pt will report carrover of 4 compensatory strategies for attention/memory outside of therapy over 2 sessions    Time  5    Period  Weeks    Status  On-going       Plan - 08/20/17 1257    Clinical Impression Statement  Pt continues to complain of word-finding difficulties that occur daily. 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 His spouse is incomplete quadraplegic requiring round the clock care and Mr. Capraro reports significant emotional and financial stress around this. Stress may also adversely affect cognition. SLP believes pt's impaired cognition (abstraction, reasoning, attention) negatively impact any anomia pt may experience. Continue skilled ST to maximize attention, word finding and train in compensations for attention, memory and word finding for continue success at work and independence at home.     Speech Therapy Frequency  2x / week    Treatment/Interventions  Cueing hierarchy;Environmental controls;Language facilitation;Compensatory techniques;Cognitive reorganization;Functional tasks;SLP instruction and feedback;Patient/family education;Internal/external aids    Potential to Achieve Goals  Good    Potential Considerations  Family/community support    Consulted and Agree with Plan of Care  Patient       Patient will benefit from skilled therapeutic intervention in order to improve the following deficits and impairments:   Cognitive communication deficit    Problem List Patient Active Problem List   Diagnosis Date Noted  . PSVT (paroxysmal supraventricular tachycardia) (HCC) 09/12/2015  . Chest pain 05/03/2015  . Dyspnea 05/03/2015  . ALLERGIC RHINITIS  09/21/2007  . G E R D 09/21/2007  . SMOKE INHALATION 09/21/2007  . OSTEOPOROSIS 01/28/2007  . PALPITATIONS 01/28/2007  . COUGH 01/28/2007  . ALLERGY 01/28/2007   Rondel Baton, MS, CCC-SLP Speech-Language Pathologist  Arlana Lindau 08/20/2017, 12:58 PM  [facility] [address] [place], [place], [address] Phone: [phone]   Fax:  [phone]   Name: Lee Nelson MRN: [id number] Date of Birth: 02-14-1941

## 2017-08-21 DIAGNOSIS — K4090 Unilateral inguinal hernia, without obstruction or gangrene, not specified as recurrent: Secondary | ICD-10-CM | POA: Diagnosis not present

## 2017-08-22 ENCOUNTER — Encounter (INDEPENDENT_AMBULATORY_CARE_PROVIDER_SITE_OTHER): Payer: Self-pay | Admitting: Orthopedic Surgery

## 2017-08-22 NOTE — Progress Notes (Signed)
Office Visit Note   Patient: Lee Nelson           Date of Birth: 1940-08-22           MRN: [id number] Visit Date: 08/17/2017 Requested by: Chilton Greathouse, MD [address] [place], [place] [address] PCP: Chilton Greathouse, MD  Subjective: Chief Complaint  Patient presents with  . Right Hip - Pain  . Left Hand - Follow-up, Pain  . Right Foot - Pain  . Left Foot - Pain  . Right Shoulder - Pain    HPI: Lee Nelson is a patient with multiple joint complaints today.  He has had previous fractures to left hand phalanges 3 and 4.  States he still cannot fully close the hand or make a fist.  He also reports right hip pain for about a year.  He wonders if he may have a hernia.  He has been doing some stretching exercises which help.  He also describes pain in both feet right worse than left.  Does have a history of stress reaction in the left foot which resolved.  He also reports a little bit of burning pain in the anterior deltoid region of the right shoulder.  Only occurs with certain movements and does not seem to have a radicular component.  He can walk about one half blocks a day.  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  Has a wife who was injured in a horse riding accident              ROS: All systems reviewed are negative as they relate to the chief complaint within the history of present illness.  Patient denies  fevers or chills.   Assessment & Plan: Visit Diagnoses:  1. Pain in right hip   2. Pain in right foot   3. Pain in left foot   4. Acute pain of right shoulder     Plan: Impression is left hand diminished range of motion due to healing fractures particularly at the PIP joint on the third finger.  I think that is part of the course with this injury and nothing really to do about it at this time.  The right hip may have a component of early femoral acetabular impingement as well as a possible hernia.  I think he should have that checked out by either a general surgeon or his  primary care physician.  Both feet are hurting as well and he has a history of stress fracture on the left-hand side.  No real focal tenderness or swelling in the feet today to indicate further work-up needed on that.  The right shoulder also has a little bit of burning anteriorly and that may be bursitis and it does not seem to be referred pain from the neck.  We could consider injection in the subacromial space at that gets worse.  Lee Nelson is somewhat of a noninterventional list.  We have done a fairly exhaustive diagnostic work-ups on him in the past for multiple ailments.  Follow-Up Instructions: Return if symptoms worsen or fail to improve.   Orders:  Orders Placed This Encounter  Procedures  . XR HIP UNILAT W OR W/O PELVIS 2-3 VIEWS RIGHT  . XR Foot Complete Right  . XR Foot Complete Left  . XR Shoulder Right   No orders of the defined types were placed in this encounter.     Procedures: No procedures performed   Clinical Data: No additional findings.  Objective: Vital Signs: There  were no vitals taken for this visit.  Physical Exam:   Constitutional: Patient appears well-developed HEENT:  Head: Normocephalic Eyes:EOM are normal Neck: Normal range of motion Cardiovascular: Normal rate Pulmonary/chest: Effort normal Neurologic: Patient is alert Skin: Skin is warm Psychiatric: Patient has normal mood and affect    Ortho Exam: Orthopedic exam demonstrates that he is lost a little bit of weight since I last seen him.  Bilateral feet are examined.  No focal tenderness is present to palpation of the midfoot or metatarsal region.  Patient has palpable intact nontender anterior to posterior to peroneal and Achilles tendon with symmetric tibiotalar subtalar transverse tarsal range of motion.  Not much in the way of groin pain with internal/external rotation of the leg but he does localize some of his pain to the groin area which is more in the region of a hernia on the right as  opposed to hip arthritis.  No real trochanteric tenderness is noted.  No restriction of internal or external rotation of either hip.  Right shoulder is examined.  Has good active and passive range of motion of the shoulder.  Rotator cuff strength intact.  No masses lymph adenopathy or skin changes noted in that shoulder girdle region.  Radicular no evidence of frozen shoulder.  No AC joint tenderness to direct palpation.  Right hand is examined and he does have some arthritis in the DIP and MCP joints.  Does have some swelling around the PIP joint of 3 and 4 which is consistent with his prior injury.  No evidence of boutonniere deformity in any of the fingers.  Specialty Comments:  No specialty comments available.  Imaging: No results found.   PMFS History: Patient Active Problem List   Diagnosis Date Noted  . PSVT (paroxysmal supraventricular tachycardia) (HCC) 09/12/2015  . Chest pain 05/03/2015  . Dyspnea 05/03/2015  . ALLERGIC RHINITIS 09/21/2007  . G E R D 09/21/2007  . SMOKE INHALATION 09/21/2007  . OSTEOPOROSIS 01/28/2007  . PALPITATIONS 01/28/2007  . COUGH 01/28/2007  . ALLERGY 01/28/2007   Past Medical History:  Diagnosis Date  . Anxiety   . Arthritis   . Bladder calculus   . BPH (benign prostatic hyperplasia)   . Chronic constipation   . Complication of anesthesia    " I had some coughing afterwards for a couple of days"--  per pt "perfers spinal anesthesia since general anesthesia congnitive issues when older"  . Diverticulosis of colon   . Dry eye syndrome of both eyes   . Environmental allergies   . GERD (gastroesophageal reflux disease)    occasional  . History of adenomatous polyp of colon    08/ 2004  . History of kidney stones   . History of squamous cell carcinoma in situ (SCCIS) of skin    s/p  excision 2013 facial areas and 06/ 2016 nose  . Migraine    eye migraine occasional  . Seasonal and perennial allergic rhinitis   . Thrombocytopenia (HCC)   .  Tingling    hands and feet bilat , intermittantly-- per pt has lumbar bulging disk  . Urinary frequency   . Vocal fold atrophy    dysphonia-- per pt has to drink large amount of water to take even on pill  . Wears glasses     Family History  Problem Relation Age of Onset  . Parkinsonism Brother   . COPD Mother   . Allergies Mother   . Heart failure Mother   . COPD  Father   . Stroke Father   . Other Sister   . Suicidality Maternal Aunt   . Cancer Maternal Grandfather     Past Surgical History:  Procedure Laterality Date  . APPENDECTOMY  child  . CARDIOVASCULAR STRESS TEST  05-17-2015  dr hilty   Low risk nuclear study w/ no ischemia/  normal LV function and wall motion , stress ef 54% (lvef 45-54%)  . CHOLECYSTECTOMY N/A 09/29/2014   Procedure: LAPAROSCOPIC CHOLECYSTECTOMY WITH INTRAOPERATIVE CHOLANGIOGRAM;  Surgeon: Ovidio Kin, MD;  Location: [facility];  Service: General;  Laterality: N/A;  . COLONOSCOPY  last one 09-06-2010  . ESOPHAGOGASTRODUODENOSCOPY N/A 12/09/2013   Procedure: ESOPHAGOGASTRODUODENOSCOPY (EGD);  Surgeon: Willis Modena, MD;  Location: [facility] ENDOSCOPY;  Service: Endoscopy;  Laterality: N/A;  . EXTRACORPOREAL SHOCK WAVE LITHOTRIPSY  yrs ago  . INGUINAL HERNIA REPAIR Left child  . NISSEN FUNDOPLICATION  1980's   open  . STONE EXTRACTION WITH BASKET N/A 08/18/2016   Procedure: STONE EXTRACTION WITH BASKET;  Surgeon: Jethro Bolus, MD;  [facility];  Service: Urology;  Laterality: N/A;  . THULIUM LASER TURP (TRANSURETHRAL RESECTION OF PROSTATE) N/A 08/18/2016   Procedure: THULIUM LASER BLADDER NECK INCISION AND BLADDER STONE REMOVAL;  Surgeon: Jethro Bolus, MD;  Location: [facility];  Service: Urology;  Laterality: N/A;  . TONSILLECTOMY  child  . TRANSTHORACIC ECHOCARDIOGRAM  11/18/2010   grade 1 diastolic dysfunction, ef 55-60%/  trivial MR and TR/ mild dilated RA   Social History   Occupational History  .  Occupation: teaches constitutional Social worker  . 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 Smoking status: Never Smoker  . Smokeless tobacco: Never Used  Substance and Sexual Activity  . Alcohol use: Yes    Comment: social  . Drug use: No  . Sexual activity: Not on file

## 2017-08-25 ENCOUNTER — Ambulatory Visit: Payer: BLUE CROSS/BLUE SHIELD | Admitting: Internal Medicine

## 2017-08-25 ENCOUNTER — Encounter: Payer: Self-pay | Admitting: Internal Medicine

## 2017-08-25 VITALS — BP 92/53 | HR 55 | Ht 69.5 in | Wt 119.4 lb

## 2017-08-25 DIAGNOSIS — I471 Supraventricular tachycardia: Secondary | ICD-10-CM | POA: Diagnosis not present

## 2017-08-25 DIAGNOSIS — G453 Amaurosis fugax: Secondary | ICD-10-CM

## 2017-08-25 NOTE — Patient Instructions (Signed)
Your physician recommends that you schedule a follow-up appointment as needed with Dr. Hilty.  

## 2017-08-25 NOTE — Progress Notes (Signed)
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 Karsten Fells with [facility] and vascular center until 2010. In the past he said complaints of chest pain and shortness of breath but underwent workup including stress test and echocardiogram which was negative. He also had some lower extremity leg pain concerning for claudication but had normal arterial Dopplers. Recently he's had some left chest pain and shortness of breath. He also feels some discomfort from time to time and almost on a daily basis some fluttering or what described as palpitations. He saw his primary care provider for this who referred him back to our office. He does not have a lot of traditional cardiac risk factors. He denies hypertension, diabetes or significant family history of coronary disease. He reports he generally been thin most of his life and has a low blood pressure and low heart rate at rest. He was referred for a lexiscan stress test and monitor. The stress test was negative for ischemia with an EF of 54%. He wore the 48 hour monitor which demonstrated an episode of PSVT. This was less than 10 seconds however could've made him symptomatic. This could explain why been feeling some palpitations. It did not appear to be atrial fibrillation or atrial flutter.  09/12/2015  Lee Nelson returns today for follow-up. He's been taking his low-dose of Toprol but reports some occasional dizziness and fatigue with exertion. He has run a low normal blood pressure and I was concerned about it possibly getting lower with the beta blocker. He does note that his ectopy has improved on the beta blocker, but the trade off side effects do not seem to be worth it.  08/25/2017  Lee Nelson is seen today in follow-up.  Overall he is doing well.  He came off of the Toprol because of  low blood pressure and he did not quite feel as well on it.  He reports some palpitations intermittently but generally is not bothered by them.  He recently had an episode of amaurosis fugax.  He had a work-up through Dr. Marjory Lies at [facility] neurologic and was thought to ultimately have migraine with aura.  He also had an ophthalmologic evaluation at [facility].  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  PMHx:  Past Medical History:  Diagnosis Date  . Anxiety   . Arthritis   . Bladder calculus   . BPH (benign prostatic hyperplasia)   . Chronic constipation   . Complication of anesthesia    " I had some coughing afterwards for a couple of days"--  per pt "perfers spinal anesthesia since general anesthesia congnitive issues when older"  . Diverticulosis of colon   . Dry eye syndrome of both eyes   . Environmental allergies   . GERD (gastroesophageal reflux disease)    occasional  . History of adenomatous polyp of colon    08/ 2004  . History of kidney stones   . History of squamous cell carcinoma in situ (SCCIS) of skin    s/p  excision 2013 facial areas and 06/ 2016 nose  . Migraine    eye migraine occasional  . Seasonal and perennial allergic rhinitis   . Thrombocytopenia (HCC)   . Tingling    hands and feet bilat , intermittantly-- per pt has lumbar bulging disk  . Urinary frequency   . Vocal fold atrophy  dysphonia-- per pt has to drink large amount of water to take even on pill  . Wears glasses     Past Surgical History:  Procedure Laterality Date  . APPENDECTOMY  child  . CARDIOVASCULAR STRESS TEST  05-17-2015  dr hilty   Low risk nuclear study w/ no ischemia/  normal LV function and wall motion , stress ef 54% (lvef 45-54%)  . CHOLECYSTECTOMY N/A 09/29/2014   Procedure: LAPAROSCOPIC CHOLECYSTECTOMY WITH INTRAOPERATIVE CHOLANGIOGRAM;  Surgeon: Ovidio Kin, MD;  Location: [facility];  Service: General;  Laterality: N/A;  . COLONOSCOPY  last one 09-06-2010  . ESOPHAGOGASTRODUODENOSCOPY  N/A 12/09/2013   Procedure: ESOPHAGOGASTRODUODENOSCOPY (EGD);  Surgeon: Willis Modena, MD;  Location: [facility] END[facility]COPY;  Service: Endoscopy;  Laterality: N/A;  . EXTRACORPOREAL SHOCK WAVE LITHOTRIPSY  yrs ago  . INGUINAL HERNIA REPAIR Left child  . NISSEN FUNDOPLICATION  1980's   open  . STONE EXTRACTION WITH BASKET N/A 08/18/2016   Procedure: STONE EXTRACTION WITH BASKET;  Surgeon: Jethro Bolus, MD;  Location[facility];  Service: Urology;  Laterality: N/A;  . THULIUM LASER TURP (TRANSURETHRAL RESECTION OF PROSTATE) N/A 08/18/2016   Procedure: THULIUM LASER BLADDER NECK INCISION AND BLADDER STONE REMOVAL;  Surgeon: Jethro Bolus, MD;  Location: [facility];  Service: Urology;  Laterality: N/A;  . TONSILLECTOMY  child  . TRANSTHORACIC ECHOCARDIOGRAM  11/18/2010   grade 1 diastolic dysfunction, ef 55-60%/  trivial MR and TR/ mild dilated RA    FAMHx:  Family History  Problem Relation Age of Onset  . Parkinsonism Brother   . COPD Mother   . Allergies Mother   . Heart failure Mother   . COPD Father   . Stroke Father   . Other Sister   . Suicidality Maternal Aunt   . Cancer Maternal Grandfather     SOCHx:   reports that he has never smoked. He has never used smokeless tobacco. He reports that he drinks alcohol. He reports that he does not use drugs.  ALLERGIES:  Allergies  Allergen Reactions  . Flagyl [Metronidazole] Other (See Comments)    REACTION: no appetite, diarrhea after meal, decrease in weight  . Metoclopramide Hcl Other (See Comments)    REACTION: "involuntary movements"  . Septra [Sulfamethoxazole-Trimethoprim] Other (See Comments)    REACTION: "involuntary movements" tripac antibiotic- heart rythm    ROS: Pertinent items noted in HPI and remainder of comprehensive ROS otherwise negative.  HOME MEDS: Current Outpatient Medications  Medication Sig Dispense Refill  . ALPRAZolam (XANAX) 0.25 MG tablet Take 0.175 mg by mouth  2 (two) times daily as needed for sleep (only takes .175 mg at night).     . Cholecalciferol (VITAMIN D-3) 1000 units CAPS Take 2,000 Units by mouth daily.     . Cranberry 400 MG CAPS Take 400-1,200 mg by mouth every morning.     . Ginseng 100 MG CAPS Take by mouth.    Marland Kitchen HYDROcodone-acetaminophen (NORCO/VICODIN) 5-325 MG tablet 1 po q d prn pain 30 tablet 0  . L-Tyrosine 500 MG CAPS Take by mouth daily.    Marland Kitchen loratadine (CLARITIN) 10 MG tablet Take 10 mg by mouth every morning.    . Menaquinone-7 (VITAMIN K2 PO) Take 120-240 mg by mouth daily. Combo w/ Vit. D    . Milk Thistle 175 MG CAPS Take 1 capsule by mouth daily.     . Multiple Vitamin (MULTIVITAMIN) capsule Take 1 capsule by mouth every morning.     Marland Kitchen OVER THE  COUNTER MEDICATION Take 3 capsules by mouth 2 (two) times daily. Lion's mane 0.5 gram/capsule    . OVER THE COUNTER MEDICATION Take 2 capsules by mouth every morning. Reparagen supplement    . OVER THE COUNTER MEDICATION Take 1 tablet by mouth daily as needed (for eye migraine). Mygra Few    . OVER THE COUNTER MEDICATION CocoaVia flavonoids    . OVER THE COUNTER MEDICATION Aloe, Mucilaginous, Polysaccheride, L-glutinine--- takes x1  each twice daily for constipation    . OVER THE COUNTER MEDICATION Tranquil Sleep (5HDP, melatonin)  Takes bedtime as needed    . OVER THE COUNTER MEDICATION Sinus Clear Herb PO-- takes as needed    . Phosphatidylserine 100 MG CAPS Take 100 mg by mouth every morning.     . Probiotic Product (PROBIOTIC DAILY PO) Take by mouth.    . sodium chloride (MURO 128) 5 % ophthalmic ointment Place 1 application into both eyes 2 (two) times daily.     . THEANINE PO Take 2 tablets by mouth 2 (two) times daily.     . Turmeric, Curcuma Longa, (CURCUMIN) POWD 220 mg by Does not apply route.    . vitamin C (ASCORBIC ACID) 250 MG tablet Take 250 mg by mouth daily.     No current facility-administered medications for this visit.     LABS/IMAGING: No results found  for this or any previous visit (from the past 48 hour(s)). No results found.  WEIGHTS: Wt Readings from Last 3 Encounters:  08/25/17 119 lb 6.4 oz (54.2 kg)  05/08/17 117 lb 6.4 oz (53.3 kg)  12/30/16 125 lb (56.7 kg)    VITALS: BP (!) 92/53   Pulse (!) 55   Ht 5' 9.5" (1.765 m)   Wt 119 lb 6.4 oz (54.2 kg)   SpO2 98%   BMI 17.38 kg/m   EXAM: General appearance: alert and no distress Neck: no carotid bruit, no JVD and thyroid not enlarged, symmetric, no tenderness/mass/nodules Lungs: clear to auscultation bilaterally Heart: regular rate and rhythm Abdomen: soft, non-tender; bowel sounds normal; no masses,  no organomegaly Extremities: extremities normal, atraumatic, no cyanosis or edema Pulses: 2+ and symmetric Skin: Skin color, texture, turgor normal. No rashes or lesions Neurologic: Grossly normal Psych: Pleasant  EKG: Sinus bradycardia 54-personally reviewed  ASSESSMENT: 1. Chest pain - low risk nuclear stress test with normal LV function 2. Dyspnea on exertion - stable 3. PSVT  PLAN: 1.   Mr. Schriefer T needs to do well without any significant or lifestyle limiting SVT.  He is not currently on beta-blocker mostly due to low blood pressure and heart rate.  He could potentially use it as needed.  From a cardiac standpoint he is at low risk and can see me back on an as-needed basis.  Chrystie Nose, MD, [facility], FACP  [place]  [facility]  Medical Director of the [facility]  Cardiovascular Risk Reduction Clinic Diplomate of the American Board of Clinical Lipidology Attending Cardiologist  Direct Dial: [phone]  Fax: [phone]  Website:  [email or web address] Nicely Hilty 08/25/2017, 4:19 PM

## 2017-08-26 ENCOUNTER — Ambulatory Visit: Payer: BLUE CROSS/BLUE SHIELD

## 2017-08-26 DIAGNOSIS — R41841 Cognitive communication deficit: Secondary | ICD-10-CM | POA: Diagnosis not present

## 2017-08-26 NOTE — Therapy (Signed)
[facility] [address] [place], [place], [address] Phone: [phone]   Fax:  [phone]  Speech Language Pathology Treatment  Patient Details  Name: Lee Nelson MRN: [id number] Date of Birth: [birth year] Referring Provider: Dr. Joycelyn Schmid   Encounter Date: 08/26/2017  End of Session - 08/26/17 1142    Visit Number  10    Number of Visits  17    Date for SLP Re-Evaluation  08/24/17    SLP Start Time  1020    SLP Stop Time   1104    SLP Time Calculation (min)  44 min    Activity Tolerance  Patient tolerated treatment well       Past Medical History:  Diagnosis Date  . Anxiety   . Arthritis   . Bladder calculus   . BPH (benign prostatic hyperplasia)   . Chronic constipation   . Complication of anesthesia    " I had some coughing afterwards for a couple of days"--  per pt "perfers spinal anesthesia since general anesthesia congnitive issues when older"  . Diverticulosis of colon   . Dry eye syndrome of both eyes   . Environmental allergies   . GERD (gastroesophageal reflux disease)    occasional  . History of adenomatous polyp of colon    08/ 2004  . History of kidney stones   . History of squamous cell carcinoma in situ (SCCIS) of skin    s/p  excision 2013 facial areas and 06/ 2016 nose  . Migraine    eye migraine occasional  . Seasonal and perennial allergic rhinitis   . Thrombocytopenia (HCC)   . Tingling    hands and feet bilat , intermittantly-- per pt has lumbar bulging disk  . Urinary frequency   . Vocal fold atrophy    dysphonia-- per pt has to drink large amount of water to take even on pill  . Wears glasses     Past Surgical History:  Procedure Laterality Date  . APPENDECTOMY  child  . CARDIOVASCULAR STRESS TEST  05-17-2015  dr hilty   Low risk nuclear study w/ no ischemia/  normal LV function and wall motion , stress ef 54% (lvef 45-54%)  . CHOLECYSTECTOMY N/A 09/29/2014   Procedure: LAPAROSCOPIC CHOLECYSTECTOMY WITH INTRAOPERATIVE CHOLANGIOGRAM;  Surgeon: Ovidio Kin, MD;  Location: [facility];  Service: General;  Laterality: N/A;  . COLONOSCOPY  last one 09-06-2010  . ESOPHAGOGASTRODUODENOSCOPY N/A 12/09/2013   Procedure: ESOPHAGOGASTRODUODENOSCOPY (EGD);  Surgeon: Willis Modena, MD;  Location: [facility] ENDOSCOPY;  Service: Endoscopy;  Laterality: N/A;  . EXTRACORPOREAL SHOCK WAVE LITHOTRIPSY  yrs ago  . INGUINAL HERNIA REPAIR Left child  . NISSEN FUNDOPLICATION  1980's   open  . STONE EXTRACTION WITH BASKET N/A 08/18/2016   Procedure: STONE EXTRACTION WITH BASKET;  Surgeon: Jethro Bolus, MD;  Location: [facility];  Service: Urology;  Laterality: N/A;  . THULIUM LASER TURP (TRANSURETHRAL RESECTION OF PROSTATE) N/A 08/18/2016   Procedure: THULIUM LASER BLADDER NECK INCISION AND BLADDER STONE REMOVAL;  Surgeon: Jethro Bolus, MD;  Location: [facility];  Service: Urology;  Laterality: N/A;  . TONSILLECTOMY  child  . TRANSTHORACIC ECHOCARDIOGRAM  11/18/2010   grade 1 diastolic dysfunction, ef 55-60%/  trivial MR and TR/ mild dilated RA    There were no vitals filed for this visit.         ADULT SLP TREATMENT - 08/26/17 1134      General Information  Behavior/Cognition  Alert;Cooperative;Pleasant mood      Treatment Provided   Treatment provided  Cognitive-Linquistic      Cognitive-Linquistic Treatment   Treatment focused on  Cognition    Skilled Treatment  SLP began this session reviewing pt's long term goals with him and discussing specific ways pt could meet his LTGs.  Pt told SLP two compensations he uses for memory, and will track specific memory compensations he is using for next session by keeping a list. Pt tells SLP he does not have difficulty with specifics of conversations and of narratives, but of items - gave the example of going to the store and not having a list and missing an item, or having a list and  not pulling it out. SLP told pt if a list is to be used it has to be in the same place at home, or location may be forgotten. SLP agreed pt is likely using anomia/dysnomia compensations without realizing it, given his occupation and need for a good grasp of the language. SLP demonstrated this by engaging in 14 minute complex conversation and pt without notable anomia except for one author's name. Pt recalled name of the book and SLP found author's name with a quick Google search (pt stated he could have also done this. Conversation up to the 14 minute complex conversation did not demo any word finding difficulties (simple to complex in nature).      Assessment / Recommendations / Plan   Plan  -- reduce to once/week due to progress      Progression Toward Goals   Progression toward goals  Progressing toward goals       SLP Education - 08/26/17 1142    Education provided  Yes    Education Details  memory, word finding strategies    Person(s) Educated  Patient    Methods  Explanation;Demonstration    Comprehension  Verbalized understanding;Need further instruction       SLP Short Term Goals - 08/20/17 1032      SLP SHORT TERM GOAL #1   Title  Pt will complete complex naming tasks with 90% accuracy and rare min A    Time  1    Period  Weeks    Status  Achieved      SLP SHORT TERM GOAL #2   Title  Pt will utilize compensations for dysnomia in structured tasks with rare min A over 2 sessions.    Time  1    Period  Weeks    Status  Achieved      SLP SHORT TERM GOAL #3   Title  Pt will report carryover of 2 compensations for memory/attention outside of therapy over 2 sessions     Time  1    Period  Weeks    Status  Achieved      SLP SHORT TERM GOAL #4   Title  Pt will divide attention between 2 simple cognitive linguistic tasks with 85% on each and occasional min A     Time  1    Period  Weeks    Status  Achieved       SLP Long Term Goals - 08/26/17 1146      SLP LONG TERM  GOAL #1   Title  Pt will utilize compensations for dysnomia in complex conversation over 10 minutes as needed with rare min A    Time  4    Period  Weeks    Status  On-going  SLP LONG TERM GOAL #2   Title  Pt will report carrover of 4 compensatory strategies for attention/memory outside of therapy over 2 sessions    Time  4    Period  Weeks    Status  On-going       Plan - 08/26/17 1143    Clinical Impression Statement  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 Stress may also adversely affect cognition. Pt agrees his compensations are fairly good at this point and he will notate specific memory compensations he uses for the next session, to meet LTG #2. Continue skilled ST at once/week (due to pt progress) to maximize attention, word finding and train in compensations for attention, memory and word finding for continue success at work and independence at home.     Speech Therapy Frequency  1x /week    Duration  -- 8 weeks/17 total visits    Treatment/Interventions  Cueing hierarchy;Environmental controls;Language facilitation;Compensatory techniques;Cognitive reorganization;Functional tasks;SLP instruction and feedback;Patient/family education;Internal/external aids    Potential to Achieve Goals  Good    Potential Considerations  Family/community support    Consulted and Agree with Plan of Care  Patient       Patient will benefit from skilled therapeutic intervention in order to improve the following deficits and impairments:   Cognitive communication deficit    Problem List Patient Active Problem List   Diagnosis Date Noted  . Amaurosis fugax of right eye 08/25/2017  . PSVT (paroxysmal supraventricular tachycardia) (HCC) 09/12/2015  . Chest pain 05/03/2015  . Dyspnea 05/03/2015  . ALLERGIC  RHINITIS 09/21/2007  . G E R D 09/21/2007  . SMOKE INHALATION 09/21/2007  . OSTEOPOROSIS 01/28/2007  . PALPITATIONS 01/28/2007  . COUGH 01/28/2007  . ALLERGY 01/28/2007    [facility] ,MS, CCC-SLP  08/26/2017, 11:46 AM  [facility] [address] [place], [place], [address] Phone: [phone]   Fax:  [phone]   Name: Lee Nelson MRN: [id number] Date of Birth: 09-Mar-1941

## 2017-08-27 DIAGNOSIS — G43909 Migraine, unspecified, not intractable, without status migrainosus: Secondary | ICD-10-CM | POA: Diagnosis not present

## 2017-08-27 DIAGNOSIS — G3184 Mild cognitive impairment, so stated: Secondary | ICD-10-CM | POA: Diagnosis not present

## 2017-08-27 DIAGNOSIS — Z79899 Other long term (current) drug therapy: Secondary | ICD-10-CM | POA: Diagnosis not present

## 2017-08-27 DIAGNOSIS — G453 Amaurosis fugax: Secondary | ICD-10-CM | POA: Diagnosis not present

## 2017-08-28 ENCOUNTER — Ambulatory Visit: Payer: BLUE CROSS/BLUE SHIELD

## 2017-08-28 DIAGNOSIS — R41841 Cognitive communication deficit: Secondary | ICD-10-CM | POA: Diagnosis not present

## 2017-08-28 NOTE — Patient Instructions (Addendum)
Apps and games to work on word finding:  Development worker, community - if you include definitions for the words you spell  [employer]

## 2017-08-28 NOTE — Therapy (Signed)
[facility] [address] [place], [place], [address] Phone: [phone]   Fax:  [phone]  Speech Language Pathology Treatment  Patient Details  Name: Lee Nelson MRN: [id number] Date of Birth: [birth year] Referring Provider: Dr. Joycelyn Schmid   Encounter Date: 08/28/2017  End of Session - 08/28/17 1656    Visit Number  11    Number of Visits  17    Date for SLP Re-Evaluation  09/25/17       Past Medical History:  Diagnosis Date  . Anxiety   . Arthritis   . Bladder calculus   . BPH (benign prostatic hyperplasia)   . Chronic constipation   . Complication of anesthesia    " I had some coughing afterwards for a couple of days"--  per pt "perfers spinal anesthesia since general anesthesia congnitive issues when older"  . Diverticulosis of colon   . Dry eye syndrome of both eyes   . Environmental allergies   . GERD (gastroesophageal reflux disease)    occasional  . History of adenomatous polyp of colon    08/ 2004  . History of kidney stones   . History of squamous cell carcinoma in situ (SCCIS) of skin    s/p  excision 2013 facial areas and 06/ 2016 nose  . Migraine    eye migraine occasional  . Seasonal and perennial allergic rhinitis   . Thrombocytopenia (HCC)   . Tingling    hands and feet bilat , intermittantly-- per pt has lumbar bulging disk  . Urinary frequency   . Vocal fold atrophy    dysphonia-- per pt has to drink large amount of water to take even on pill  . Wears glasses     Past Surgical History:  Procedure Laterality Date  . APPENDECTOMY  child  . CARDIOVASCULAR STRESS TEST  05-17-2015  dr hilty   Low risk nuclear study w/ no ischemia/  normal LV function and wall motion , stress ef 54% (lvef 45-54%)  . CHOLECYSTECTOMY N/A 09/29/2014   Procedure: LAPAROSCOPIC CHOLECYSTECTOMY WITH INTRAOPERATIVE CHOLANGIOGRAM;  Surgeon: Ovidio Kin, MD;  Location: [facility];  Service: General;   Laterality: N/A;  . COLONOSCOPY  last one 09-06-2010  . ESOPHAGOGASTRODUODENOSCOPY N/A 12/09/2013   Procedure: ESOPHAGOGASTRODUODENOSCOPY (EGD);  Surgeon: Willis Modena, MD;  Location: [facility] ENDOSCOPY;  Service: Endoscopy;  Laterality: N/A;  . EXTRACORPOREAL SHOCK WAVE LITHOTRIPSY  yrs ago  . INGUINAL HERNIA REPAIR Left child  . NISSEN FUNDOPLICATION  1980's   open  . STONE EXTRACTION WITH BASKET N/A 08/18/2016   Procedure: STONE EXTRACTION WITH BASKET;  Surgeon: Jethro Bolus, MD;  Location: [facility];  Service: Urology;  Laterality: N/A;  . THULIUM LASER TURP (TRANSURETHRAL RESECTION OF PROSTATE) N/A 08/18/2016   Procedure: THULIUM LASER BLADDER NECK INCISION AND BLADDER STONE REMOVAL;  Surgeon: Jethro Bolus, MD;  Location: [facility];  Service: Urology;  Laterality: N/A;  . TONSILLECTOMY  child  . TRANSTHORACIC ECHOCARDIOGRAM  11/18/2010   grade 1 diastolic dysfunction, ef 55-60%/  trivial MR and TR/ mild dilated RA    There were no vitals filed for this visit.         ADULT SLP TREATMENT - 08/28/17 1046      General Information   Behavior/Cognition  Alert;Cooperative;Pleasant mood      Treatment Provided   Treatment provided  Cognitive-Linquistic      Cognitive-Linquistic Treatment   Treatment focused on  Cognition    Skilled Treatment  Pt arrived with questions for SLP re: what the memory strategies are: SLP reviewed with pt - he thought of 2/4 independently. SLP and pt discussed the compensatory strategies he uses already for attention and word finding, and memory.  Pt gave one strategy he uses for alternating attention (a timer in order to return to original task), used a memory strategy in the session (writing notes from the session today), and told SLP of 2 strategies he is using for word finding. SLP educated pt on unable to rehab memory and so compensations are the way to improve memory. Pt stated he was disappointed with his  inability to find all the words he would like to as quickly as he would like. SLP had long discussion with pt educating him on the types of tasks he could do at home for improving word finding and pt wrote them down (games, apps).       Assessment / Recommendations / Plan   Plan  Continue with current plan of care      Progression Toward Goals   Progression toward goals  Progressing toward goals       SLP Education - 08/28/17 1655    Education provided  Yes    Education Details  games/apps for word finding, pt will have to be consistent with word finding tasks or activities for greater chance for positive change    Person(s) Educated  Patient    Methods  Explanation;Handout    Comprehension  Verbalized understanding       SLP Short Term Goals - 08/28/17 1657      SLP SHORT TERM GOAL #1   Title  Pt will complete complex naming tasks with 90% accuracy and rare min A    Status  Achieved      SLP SHORT TERM GOAL #2   Title  Pt will utilize compensations for dysnomia in structured tasks with rare min A over 2 sessions.    Status  Achieved      SLP SHORT TERM GOAL #3   Title  Pt will report carryover of 2 compensations for memory/attention outside of therapy over 2 sessions     Status  Achieved      SLP SHORT TERM GOAL #4   Title  Pt will divide attention between 2 simple cognitive linguistic tasks with 85% on each and occasional min A     Status  Achieved       SLP Long Term Goals - 08/28/17 1657      SLP LONG TERM GOAL #1   Title  Pt will utilize compensations for dysnomia in complex conversation over 10 minutes as needed with rare min A    Time  4    Period  Weeks    Status  On-going      SLP LONG TERM GOAL #2   Title  Pt will report carrover of 4 compensatory strategies for attention/memory outside of therapy over 2 sessions    Baseline  08-28-17    Time  4    Period  Weeks    Status  On-going         Patient will benefit from skilled therapeutic intervention in  order to improve the following deficits and impairments:   Cognitive communication deficit    Problem List Patient Active Problem List   Diagnosis Date Noted  . Amaurosis fugax of right eye 08/25/2017  . PSVT (paroxysmal supraventricular tachycardia) (HCC) 09/12/2015  . Chest pain 05/03/2015  . Dyspnea 05/03/2015  .  ALLERGIC RHINITIS 09/21/2007  . G E R D 09/21/2007  . SMOKE INHALATION 09/21/2007  . OSTEOPOROSIS 01/28/2007  . PALPITATIONS 01/28/2007  . COUGH 01/28/2007  . ALLERGY 01/28/2007    [facility] 08/28/2017, 4:58 PM  [facility] [address] [place], [place], [address] Phone: [phone]   Fax:  [phone]   Name: SHANTEL WESELY MRN: [id number] Date of Birth: 05/19/1940

## 2017-09-01 DIAGNOSIS — L718 Other rosacea: Secondary | ICD-10-CM | POA: Diagnosis not present

## 2017-09-01 DIAGNOSIS — H02889 Meibomian gland dysfunction of unspecified eye, unspecified eyelid: Secondary | ICD-10-CM | POA: Diagnosis not present

## 2017-09-01 DIAGNOSIS — H1859 Other hereditary corneal dystrophies: Secondary | ICD-10-CM | POA: Diagnosis not present

## 2017-09-01 DIAGNOSIS — H2513 Age-related nuclear cataract, bilateral: Secondary | ICD-10-CM | POA: Diagnosis not present

## 2017-09-04 ENCOUNTER — Ambulatory Visit: Payer: BLUE CROSS/BLUE SHIELD | Attending: Diagnostic Neuroimaging

## 2017-09-04 DIAGNOSIS — R41841 Cognitive communication deficit: Secondary | ICD-10-CM | POA: Diagnosis not present

## 2017-09-04 NOTE — Patient Instructions (Signed)
You'll have one more session with Lee HaleMary Nelson.  If you feel or think over the next week like this isn't necessary, feel free to call and cancel.

## 2017-09-04 NOTE — Therapy (Signed)
[facility] [address] [place], [place], [address] Phone: [phone]   Fax:  [phone]  Speech Language Pathology Treatment  Patient Details  Name: Lee Nelson MRN: [id number] Date of Birth: 04/04/1940 Referring Provider: Dr. Andrey Spearman   Encounter Date: 09/04/2017  End of Session - 09/04/17 1525    Visit Number  12    Number of Visits  17    Date for SLP Re-Evaluation  09/24/17    SLP Start Time  70    SLP Stop Time   1103    SLP Time Calculation (min)  43 min    Activity Tolerance  Patient tolerated treatment well       Past Medical History:  Diagnosis Date  . Anxiety   . Arthritis   . Bladder calculus   . BPH (benign prostatic hyperplasia)   . Chronic constipation   . Complication of anesthesia    " I had some coughing afterwards for a couple of days"--  per pt "perfers spinal anesthesia since general anesthesia congnitive issues when older"  . Diverticulosis of colon   . Dry eye syndrome of both eyes   . Environmental allergies   . GERD (gastroesophageal reflux disease)    occasional  . History of adenomatous polyp of colon    08/ 2004  . History of kidney stones   . History of squamous cell carcinoma in situ (SCCIS) of skin    s/p  excision 2013 facial areas and 06/ 2016 nose  . Migraine    eye migraine occasional  . Seasonal and perennial allergic rhinitis   . Thrombocytopenia ([place])   . Tingling    hands and feet bilat , intermittantly-- per pt has lumbar bulging disk  . Urinary frequency   . Vocal fold atrophy    dysphonia-- per pt has to drink large amount of water to take even on pill  . Wears glasses     Past Surgical History:  Procedure Laterality Date  . APPENDECTOMY  child  . CARDIOVASCULAR STRESS TEST  05-17-2015  dr hilty   Low risk nuclear study w/ no ischemia/  normal LV function and wall motion , stress ef 54% (lvef 45-54%)  . CHOLECYSTECTOMY N/A 09/29/2014   Procedure: LAPAROSCOPIC CHOLECYSTECTOMY WITH INTRAOPERATIVE CHOLANGIOGRAM;  Surgeon: Alphonsa Overall, MD;  Location: [facility];  Service: General;  Laterality: N/A;  . COLONOSCOPY  last one 09-06-2010  . ESOPHAGOGASTRODUODENOSCOPY N/A 12/09/2013   Procedure: ESOPHAGOGASTRODUODENOSCOPY (EGD);  Surgeon: Arta Silence, MD;  Location: [facility] END[facility];  Service: Endoscopy;  Laterality: N/A;  . EXTRACORPOREAL SHOCK WAVE LITHOTRIPSY  yrs ago  . INGUINAL HERNIA REPAIR Left child  . NISSEN FUNDOPLICATION  1660'Y   open  . STONE EXTRACTION WITH BASKET N/A 08/18/2016   Procedure: STONE EXTRACTION WITH BASKET;  Surgeon: Carolan Clines, MD;  Location: [facility];  Service: Urology;  Laterality: N/A;  . THULIUM LASER TURP (TRANSURETHRAL RESECTION OF PROSTATE) N/A 08/18/2016   Procedure: THULIUM LASER BLADDER NECK INCISION AND BLADDER STONE REMOVAL;  Surgeon: Carolan Clines, MD;  Location: [facility];  Service: Urology;  Laterality: N/A;  . TONSILLECTOMY  child  . TRANSTHORACIC ECHOCARDIOGRAM  11/18/2010   grade 1 diastolic dysfunction, ef 30-16%/  trivial MR and TR/ mild dilated RA    There were no vitals filed for this visit.  Subjective Assessment - 09/04/17 1023    Currently in Pain?  Yes    Pain Score  3  Pain Location  Eye    Pain Orientation  Right    Pain Descriptors / Indicators  Burning    Pain Type  Acute pain    Pain Onset  In the past 7 days    Pain Frequency  Constant    Aggravating Factors   possibly drops for the eye    Pain Relieving Factors  rest            ADULT SLP TREATMENT - 09/04/17 1025      General Information   Behavior/Cognition  Alert;Cooperative;Pleasant mood      Treatment Provided   Treatment provided  Cognitive-Linquistic      Cognitive-Linquistic Treatment   Treatment focused on  Cognition    Skilled Treatment  Pt told SLP 4 copmensatory measures he had used in the last week (since last session). Pt requested that he  and SLP review the details from last session - SLP cued pt that he took copious notes from last session during the entire session and pt responded, "Yes, but I don't know where they are." SLP problem solved with pt what he could do to make his system more functional and efficient - pt ID'd getting pages into the binder for therapy, however req'd additional cueing on specifically how to do that. SLP reviewed with pt that it is consistency of compensation use for memory that appears to be his difficulty at this time. In 15 minutes complex conversation pt did not have notable anomic events. SLP told pt he had met all of his LTGs and pt stated he would like one more appointment to "circle back to where I started, with [facility]". SLP told pt that was likely not necessary but he made it known again that was his desire. SLP to modify LTG #2      Assessment / Recommendations / Plan   Plan  Continue with current plan of care      Progression Toward Goals   Progression toward goals  Progressing toward goals       SLP Education - 09/04/17 1148    Education provided  Yes    Education Details  details from last session, how to use compensations for his best benefit    Person(s) Educated  Patient    Methods  Explanation;Handout    Comprehension  Verbalized understanding;Need further instruction       SLP Short Term Goals - 08/28/17 1657      SLP SHORT TERM GOAL #1   Title  Pt will complete complex naming tasks with 90% accuracy and rare min A    Status  Achieved      SLP SHORT TERM GOAL #2   Title  Pt will utilize compensations for dysnomia in structured tasks with rare min A over 2 sessions.    Status  Achieved      SLP SHORT TERM GOAL #3   Title  Pt will report carryover of 2 compensations for memory/attention outside of therapy over 2 sessions     Status  Achieved      SLP SHORT TERM GOAL #4   Title  Pt will divide attention between 2 simple cognitive linguistic tasks with 85% on each and  occasional min A     Status  Achieved       SLP Long Term Goals - 09/04/17 1053      SLP LONG TERM GOAL #1   Title  Pt will utilize compensations for dysnomia in complex conversation over 10 minutes  as needed with rare min A    Status  Achieved      SLP LONG TERM GOAL #2   Title  Pt will report carrover of 4 compensatory strategies for attention/memory outside of therapy over 3 sessions    Baseline  08-28-17; 09-04-17    Time  1    Period  -- session    Status  Revised       Plan - 09/04/17 1525    Clinical Impression Statement  Pt cont to agree his compensations are fairly good at this point and he met LTG #2, and also met LTG #1. Pt made it clear he wanted to stay for one more session to "come full circle with MAry Beth." Continue skilled ST for one more session to ensure consistency in compensation use. SLP modifed LTG #2 for tihs reason.     Speech Therapy Frequency  1x /week    Duration  -- one more visit after today    Treatment/Interventions  Cueing hierarchy;Environmental controls;Language facilitation;Compensatory techniques;Cognitive reorganization;Functional tasks;SLP instruction and feedback;Patient/family education;Internal/external aids    Potential to Achieve Goals  Good    Potential Considerations  Family/community support    Consulted and Agree with Plan of Care  Patient       Patient will benefit from skilled therapeutic intervention in order to improve the following deficits and impairments:   Cognitive communication deficit - Plan: SLP plan of care cert/re-cert    Problem List Patient Active Problem List   Diagnosis Date Noted  . Amaurosis fugax of right eye 08/25/2017  . PSVT (paroxysmal supraventricular tachycardia) ([place]) 09/12/2015  . Chest pain 05/03/2015  . Dyspnea 05/03/2015  . ALLERGIC RHINITIS 09/21/2007  . G E R D 09/21/2007  . SMOKE INHALATION 09/21/2007  . OSTEOPOROSIS 01/28/2007  . PALPITATIONS 01/28/2007  . COUGH 01/28/2007  . ALLERGY  01/28/2007    [place] ,[place], [place]  09/04/2017, 3:30 PM  [facility] [address] [place], [place], [address] Phone: [phone]   Fax:  [phone]   Name: DSHAWN MCNAY MRN: [id number] Date of Birth: [birth year]

## 2017-09-08 DIAGNOSIS — H43399 Other vitreous opacities, unspecified eye: Secondary | ICD-10-CM | POA: Diagnosis not present

## 2017-09-08 DIAGNOSIS — H02889 Meibomian gland dysfunction of unspecified eye, unspecified eyelid: Secondary | ICD-10-CM | POA: Diagnosis not present

## 2017-09-08 DIAGNOSIS — H1859 Other hereditary corneal dystrophies: Secondary | ICD-10-CM | POA: Diagnosis not present

## 2017-09-08 DIAGNOSIS — L718 Other rosacea: Secondary | ICD-10-CM | POA: Diagnosis not present

## 2017-09-08 DIAGNOSIS — H35371 Puckering of macula, right eye: Secondary | ICD-10-CM | POA: Diagnosis not present

## 2017-09-09 ENCOUNTER — Encounter: Payer: BLUE CROSS/BLUE SHIELD | Admitting: Speech Pathology

## 2017-09-10 ENCOUNTER — Ambulatory Visit (HOSPITAL_COMMUNITY)
Admission: EM | Admit: 2017-09-10 | Discharge: 2017-09-10 | Disposition: A | Discharge status: Home / Self Care | Payer: BLUE CROSS/BLUE SHIELD | Attending: Family Medicine | Admitting: Family Medicine

## 2017-09-10 ENCOUNTER — Encounter (HOSPITAL_COMMUNITY): Payer: Self-pay | Admitting: Emergency Medicine

## 2017-09-10 ENCOUNTER — Ambulatory Visit: Payer: BLUE CROSS/BLUE SHIELD | Admitting: Speech Pathology

## 2017-09-10 DIAGNOSIS — W540XXA Bitten by dog, initial encounter: Secondary | ICD-10-CM | POA: Diagnosis not present

## 2017-09-10 DIAGNOSIS — Z23 Encounter for immunization: Secondary | ICD-10-CM | POA: Diagnosis not present

## 2017-09-10 DIAGNOSIS — R41841 Cognitive communication deficit: Secondary | ICD-10-CM | POA: Diagnosis not present

## 2017-09-10 DIAGNOSIS — S61451A Open bite of right hand, initial encounter: Secondary | ICD-10-CM

## 2017-09-10 MED ORDER — TETANUS-DIPHTH-ACELL PERTUSSIS 5-2.5-18.5 LF-MCG/0.5 IM SUSP
INTRAMUSCULAR | Status: AC
  Filled 2017-09-10: qty 0.5

## 2017-09-10 MED ORDER — TETANUS-DIPHTH-ACELL PERTUSSIS 5-2.5-18.5 LF-MCG/0.5 IM SUSP
0.5000 mL | Freq: Once | INTRAMUSCULAR | Status: AC
  Administered 2017-09-10: 0.5 mL via INTRAMUSCULAR

## 2017-09-10 NOTE — ED Triage Notes (Signed)
Pt states his dog reached for its toy the same time he reached for it and the dog accidentally bit his hand. Pt owns dog, up to date on shots.

## 2017-09-10 NOTE — Therapy (Signed)
[facility] [address] [place], [place], [address] Phone: [phone]   Fax:  [phone]  Speech Language Pathology Treatment and Discharge Summary  Patient Details  Name: Lee Nelson MRN: [id number] Date of Birth: 06/26/1940 Referring Provider: Dr. Vikram Penumalli   Encounter Date: 09/10/2017  End of Session - 09/10/17 1408    Visit Number  13    Number of Visits  17    Date for SLP Re-Evaluation  09/24/17    SLP Start Time  1318    SLP Stop Time   1400    SLP Time Calculation (min)  42 min    Activity Tolerance  Patient tolerated treatment well       Past Medical History:  Diagnosis Date  . Anxiety   . Arthritis   . Bladder calculus   . BPH (benign prostatic hyperplasia)   . Chronic constipation   . Complication of anesthesia    " I had some coughing afterwards for a couple of days"--  per pt "perfers spinal anesthesia since general anesthesia congnitive issues when older"  . Diverticulosis of colon   . Dry eye syndrome of both eyes   . Environmental allergies   . GERD (gastroesophageal reflux disease)    occasional  . History of adenomatous polyp of colon    08/ 2004  . History of kidney stones   . History of squamous cell carcinoma in situ (SCCIS) of skin    s/p  excision 2013 facial areas and 06/ 2016 nose  . Migraine    eye migraine occasional  . Seasonal and perennial allergic rhinitis   . Thrombocytopenia (HCC)   . Tingling    hands and feet bilat , intermittantly-- per pt has lumbar bulging disk  . Urinary frequency   . Vocal fold atrophy    dysphonia-- per pt has to drink large amount of water to take even on pill  . Wears glasses     Past Surgical History:  Procedure Laterality Date  . APPENDECTOMY  child  . CARDIOVASCULAR STRESS TEST  05-17-2015  dr hilty   Low risk nuclear study w/ no ischemia/  normal LV function and wall motion , stress ef 54% (lvef 45-54%)  .  CHOLECYSTECTOMY N/A 09/29/2014   Procedure: LAPAROSCOPIC CHOLECYSTECTOMY WITH INTRAOPERATIVE CHOLANGIOGRAM;  Surgeon: David Newman, MD;  Location: [facility];  Service: General;  Laterality: N/A;  . COLONOSCOPY  last one 09-06-2010  . ESOPHAGOGASTRODUODENOSCOPY N/A 12/09/2013   Procedure: ESOPHAGOGASTRODUODENOSCOPY (EGD);  Surgeon: William Outlaw, MD;  Location: [facility];  Service: Endoscopy;  Laterality: N/A;  . EXTRACORPOREAL SHOCK WAVE LITHOTRIPSY  yrs ago  . INGUINAL HERNIA REPAIR Left child  . NISSEN FUNDOPLICATION  1980's   open  . STONE EXTRACTION WITH BASKET N/A 08/18/2016   Procedure: STONE EXTRACTION WITH BASKET;  Surgeon: Tannenbaum, Sigmund, MD;  Location: [facility];  Service: Urology;  Laterality: N/A;  . THULIUM LASER TURP (TRANSURETHRAL RESECTION OF PROSTATE) N/A 08/18/2016   Procedure: THULIUM LASER BLADDER NECK INCISION AND BLADDER STONE REMOVAL;  Surgeon: Tannenbaum, Sigmund, MD;  Location: [facility];  Service: Urology;  Laterality: N/A;  . TONSILLECTOMY  child  . TRANSTHORACIC ECHOCARDIOGRAM  11/18/2010   grade 1 diastolic dysfunction, ef 55-60%/  trivial MR and TR/ mild dilated RA    There were no vitals filed for this visit.  Subjective Assessment - 09/10/17 1321    Subjective  "So what are we doing today?"      Currently in Pain?  Yes    Pain Score  2     Pain Location  Head    Pain Descriptors / Indicators  Headache    Pain Type  Acute pain            ADULT SLP TREATMENT - 09/10/17 1318      General Information   Behavior/Cognition  Alert;Cooperative;Pleasant mood    Patient Positioning  Upright in chair      Treatment Provided   Treatment provided  Cognitive-Linquistic      Cognitive-Linquistic Treatment   Treatment focused on  Cognition;Patient/family/caregiver education    Skilled Treatment  Pt told SLP 4 compensatory measures he has continued to use. SLP worked with pt to come up with strategies/ideas to continue  working on cognitive-linguistics at home outside of skilled [place]. SLP gave pt a printed handout, which he requested to be hole-punched so that he could better organize it in his binder. One instance of dysnomia (entymology vs etymology) in 20 minutes complex conversation. Pt in agreement with d/c.       Assessment / Recommendations / Plan   Plan  Discharge SLP treatment due to (comment);All goals met      Progression Toward Goals   Progression toward goals  Goals met, education completed, patient discharged from [place] Education - 09/10/17 1408    Education provided  Yes    Education Details  cognitive activities for home    Person(s) Educated  Patient    Methods  Explanation;Handout    Comprehension  Verbalized understanding       SLP Short Term Goals - 09/10/17 1334      SLP SHORT TERM GOAL #1   Title  Pt will complete complex naming tasks with 90% accuracy and rare min A    Status  Achieved      SLP SHORT TERM GOAL #2   Title  Pt will utilize compensations for dysnomia in structured tasks with rare min A over 2 sessions.    Status  Achieved      SLP SHORT TERM GOAL #3   Title  Pt will report carryover of 2 compensations for memory/attention outside of therapy over 2 sessions     Status  Achieved      SLP SHORT TERM GOAL #4   Title  Pt will divide attention between 2 simple cognitive linguistic tasks with 85% on each and occasional min A     Status  Achieved       SLP Long Term Goals - 09/10/17 1335      SLP LONG TERM GOAL #1   Title  Pt will utilize compensations for dysnomia in complex conversation over 10 minutes as needed with rare min A    Status  Achieved      SLP LONG TERM GOAL #2   Title  Pt will report carrover of 4 compensatory strategies for attention/memory outside of therapy over 3 sessions    Time  1    Period  Weeks    Status  Achieved       Plan - 09/10/17 1408    Clinical Impression Statement  Pt cont to agree his compensations are fairly  good at this point. SLP worked with pt to brainstorm activities to continue working on cognitive linguistics at home. All LTGs met and pt is in agreement with d/c at this time.     Speech Therapy Frequency  -- d/c  Duration  -- d/c    Potential to Achieve Goals  Good    Potential Considerations  Family/community support    Consulted and Agree with Plan of Care  Patient       Patient will benefit from skilled therapeutic intervention in order to improve the following deficits and impairments:   Cognitive communication deficit    Problem List Patient Active Problem List   Diagnosis Date Noted  . Amaurosis fugax of right eye 08/25/2017  . PSVT (paroxysmal supraventricular tachycardia) ([place]) 09/12/2015  . Chest pain 05/03/2015  . Dyspnea 05/03/2015  . ALLERGIC RHINITIS 09/21/2007  . G E R D 09/21/2007  . SMOKE INHALATION 09/21/2007  . OSTEOPOROSIS 01/28/2007  . PALPITATIONS 01/28/2007  . COUGH 01/28/2007  . ALLERGY 01/28/2007   SPEECH THERAPY DISCHARGE SUMMARY  Visits from Start of Care: 13  Current functional level related to goals / functional outcomes: Pt using compensations for memory, wordfinding independently. 20 minutes complex conversation WFL.   Remaining deficits: Pt cont to report occasional difficulties with memory, word finding at work and in conversations.   Education / Equipment: Cognitive activities for home provided, review of compensatory strategies Plan: Patient agrees to discharge.  Patient goals were met. Patient is being discharged due to meeting the stated rehab goals.  ?????         Deneise Lever, [place], CCC-SLP Speech-Language Pathologist  Aliene Altes 09/10/2017, 2:11 PM  [facility] [address] [place], [place], [address] Phone: [phone]   Fax:  [phone]   Name: Lee Nelson MRN: [id number] Date of Birth: [birth year]

## 2017-09-10 NOTE — ED Triage Notes (Signed)
Pt requesting tetanus shot. 

## 2017-09-10 NOTE — Patient Instructions (Signed)
Spend at least 30 minutes a day doing something that challenges you cognitively.   Cognitive Activities you can do at home:   - Solitaire  - Mahjong  - Scrabble  - Chess/Checkers (can be played online)  - Crosswords (easy level)  - Education officer, communityUno  - Card Games ([place], [place] City[place]ummy, Scientist, forensicBridge)  - Apache CorporationBoard Games  - the Costco WholesaleMemory Game  - Dominoes  - Scientist, clinical (histocompatibility and immunogenetics)Backgammon - Taboo (word game, can be played with 2+ people) - Catchphrase - Scrabble  - Reading (vocabulary books, reverse dictionary)   -Continue using your memory compensations (Alexa, writing notes, calendars, computer calendar) - Be aware of how sleep and stress impact your cognitive functioning.  - If you have a cognitively-demanding activity like a talk or a meeting, try to schedule during your "freshest" time of the day or take a rest beforehand.

## 2017-09-16 DIAGNOSIS — R3915 Urgency of urination: Secondary | ICD-10-CM | POA: Diagnosis not present

## 2017-09-16 DIAGNOSIS — R3912 Poor urinary stream: Secondary | ICD-10-CM | POA: Diagnosis not present

## 2017-09-16 DIAGNOSIS — M62838 Other muscle spasm: Secondary | ICD-10-CM | POA: Diagnosis not present

## 2017-09-16 DIAGNOSIS — M6281 Muscle weakness (generalized): Secondary | ICD-10-CM | POA: Diagnosis not present

## 2017-09-18 DIAGNOSIS — L821 Other seborrheic keratosis: Secondary | ICD-10-CM | POA: Diagnosis not present

## 2017-09-18 DIAGNOSIS — L570 Actinic keratosis: Secondary | ICD-10-CM | POA: Diagnosis not present

## 2017-09-21 DIAGNOSIS — L821 Other seborrheic keratosis: Secondary | ICD-10-CM | POA: Diagnosis not present

## 2017-09-21 DIAGNOSIS — D1801 Hemangioma of skin and subcutaneous tissue: Secondary | ICD-10-CM | POA: Diagnosis not present

## 2017-09-21 DIAGNOSIS — Z85828 Personal history of other malignant neoplasm of skin: Secondary | ICD-10-CM | POA: Diagnosis not present

## 2017-09-23 DIAGNOSIS — R102 Pelvic and perineal pain: Secondary | ICD-10-CM | POA: Diagnosis not present

## 2017-09-23 DIAGNOSIS — R350 Frequency of micturition: Secondary | ICD-10-CM | POA: Diagnosis not present

## 2017-09-23 DIAGNOSIS — M62838 Other muscle spasm: Secondary | ICD-10-CM | POA: Diagnosis not present

## 2017-09-23 DIAGNOSIS — M6281 Muscle weakness (generalized): Secondary | ICD-10-CM | POA: Diagnosis not present

## 2017-09-23 NOTE — ED Provider Notes (Signed)
[facility]   [id number] 09/10/17 Arrival Time: 1913  ASSESSMENT & PLAN:  1. Dog bite of right hand, initial encounter    Meds ordered this encounter  Medications  . Tdap (BOOSTRIX) injection 0.5 mL   No repair needed. Wound care discussed. May f/u with PCP or here with any concerns. Reviewed expectations re: course of current medical issues. Questions answered. Outlined signs and symptoms indicating need for more acute intervention. Patient verbalized understanding. After Visit Summary given.   SUBJECTIVE:  Lee Nelson is a 77 y.o. male who presents with a dog bite to his R hand. His dog. Playing with toy; reached for it; dog accidentally bit hand. Minimal pain. Dogs shots are UTD. Minimal bleeding. No function loss of hand or sensation changes reported.  Td UTD: No.  ROS: As per HPI.   OBJECTIVE:  Vitals:   09/10/17 1936  BP: (!) 125/57  Pulse: 69  Resp: 18  Temp: 97.7 F (36.5 C)  SpO2: 97%    General appearance: alert; no distress Skin: small bite to dorsal R hand; no bleeding; all fingers with FROM; normal sensation Psychological: alert and cooperative; normal mood and affect  Results for orders placed or performed in visit on 05/08/17  Glutamic acid decarboxylase auto abs  Result Value Ref Range   Glutamic Acid Decarb Ab <5.0 0.0 - 5.0 U/mL  Vitamin B12  Result Value Ref Range   Vitamin B-12 711 232 - 1,245 pg/mL    Labs Reviewed - No data to display  No results found.  Allergies  Allergen Reactions  . Flagyl [Metronidazole] Other (See Comments)    REACTION: no appetite, diarrhea after meal, decrease in weight  . Metoclopramide Hcl Other (See Comments)    REACTION: "involuntary movements"  . Septra [Sulfamethoxazole-Trimethoprim] Other (See Comments)    REACTION: "involuntary movements" tripac antibiotic- heart rythm    Past Medical History:  Diagnosis Date  . Anxiety   . Arthritis   . Bladder calculus   . BPH (benign  prostatic hyperplasia)   . Chronic constipation   . Complication of anesthesia    " I had some coughing afterwards for a couple of days"--  per pt "perfers spinal anesthesia since general anesthesia congnitive issues when older"  . Diverticulosis of colon   . Dry eye syndrome of both eyes   . Environmental allergies   . GERD (gastroesophageal reflux disease)    occasional  . History of adenomatous polyp of colon    08/ 2004  . History of kidney stones   . History of squamous cell carcinoma in situ (SCCIS) of skin    s/p  excision 2013 facial areas and 06/ 2016 nose  . Migraine    eye migraine occasional  . Seasonal and perennial allergic rhinitis   . Thrombocytopenia (HCC)   . Tingling    hands and feet bilat , intermittantly-- per pt has lumbar bulging disk  . Urinary frequency   . Vocal fold atrophy    dysphonia-- per pt has to drink large amount of water to take even on pill  . Wears glasses    Social History   Socioeconomic History  . Marital status: Married    Spouse name: Gavin Pound  . Number of children: 1  . Years of education: [place], [place], [place]  . Highest education level: Not on file  Occupational History  . Occupation: teaches constitutional Social worker  . Occupation: Photographer: Conservator, museum/gallery [employer]  Social Needs  .  Financial resource strain: Not on file  . Food insecurity:    Worry: Not on file    Inability: Not on file  . Transportation needs:    Medical: Not on file    Non-medical: Not on file  Tobacco Use  . Smoking status: Never Smoker  . Smokeless tobacco: Never Used  Substance and Sexual Activity  . Alcohol use: Yes    Comment: social  . Drug use: No  . Sexual activity: Not on file  Lifestyle  . Physical activity:    Days per week: Not on file    Minutes per session: Not on file  . Stress: Not on file  Relationships  . Social connections:    Talks on phone: Not on file    Gets together: Not on file    Attends religious service: Not on file     Active member of club or organization: Not on file    Attends meetings of clubs or organizations: Not on file    Relationship status: Not on file  Other Topics Concern  . Not on file  Social History Narrative   Patient lives at home wife.   Daily caffeine use         Mardella LaymanHagler, Daliana Leverett, MD 10/05/17 [phone]

## 2017-09-24 DIAGNOSIS — Z86010 Personal history of colonic polyps: Secondary | ICD-10-CM | POA: Diagnosis not present

## 2017-09-24 DIAGNOSIS — S60450A Superficial foreign body of right index finger, initial encounter: Secondary | ICD-10-CM | POA: Diagnosis not present

## 2017-09-24 DIAGNOSIS — R1310 Dysphagia, unspecified: Secondary | ICD-10-CM | POA: Diagnosis not present

## 2017-09-28 HISTORY — PX: OTHER SURGICAL HISTORY: SHX169

## 2017-10-07 ENCOUNTER — Telehealth: Payer: Self-pay | Admitting: Diagnostic Neuroimaging

## 2017-10-07 DIAGNOSIS — H02889 Meibomian gland dysfunction of unspecified eye, unspecified eyelid: Secondary | ICD-10-CM | POA: Diagnosis not present

## 2017-10-07 DIAGNOSIS — H2513 Age-related nuclear cataract, bilateral: Secondary | ICD-10-CM | POA: Diagnosis not present

## 2017-10-07 DIAGNOSIS — L718 Other rosacea: Secondary | ICD-10-CM | POA: Diagnosis not present

## 2017-10-07 NOTE — Telephone Encounter (Signed)
Pt is to have rt groin hernia surgery on 7/19. He is wanting to discuss whether he should have open surgery or closed? Please call to discuss.

## 2017-10-08 NOTE — Telephone Encounter (Signed)
LMVM for pt on his cell, relating to Dr [facility]'s answer below.  He is to call back if questions.

## 2017-10-08 NOTE — Telephone Encounter (Signed)
Spoke to pt.  He is asking what Dr. Richrd HumblesPenumalli's educated guess would be regarding brain cognitive function , least toxic to brain relating to him having open /local anes vs lapar. general anesthesia.  Please advise.

## 2017-10-08 NOTE — Telephone Encounter (Signed)
Local anesthesia likely has less side effects than general anesthesia. -VRP

## 2017-10-12 DIAGNOSIS — R3912 Poor urinary stream: Secondary | ICD-10-CM | POA: Diagnosis not present

## 2017-10-12 DIAGNOSIS — M62838 Other muscle spasm: Secondary | ICD-10-CM | POA: Diagnosis not present

## 2017-10-12 DIAGNOSIS — K5900 Constipation, unspecified: Secondary | ICD-10-CM | POA: Diagnosis not present

## 2017-10-12 DIAGNOSIS — M6281 Muscle weakness (generalized): Secondary | ICD-10-CM | POA: Diagnosis not present

## 2017-10-16 DIAGNOSIS — K4090 Unilateral inguinal hernia, without obstruction or gangrene, not specified as recurrent: Secondary | ICD-10-CM | POA: Diagnosis not present

## 2017-11-09 ENCOUNTER — Encounter: Payer: Self-pay | Admitting: Diagnostic Neuroimaging

## 2017-11-09 ENCOUNTER — Ambulatory Visit (INDEPENDENT_AMBULATORY_CARE_PROVIDER_SITE_OTHER): Payer: BLUE CROSS/BLUE SHIELD | Admitting: Diagnostic Neuroimaging

## 2017-11-09 VITALS — BP 92/50 | HR 62 | Ht 68.0 in | Wt 120.2 lb

## 2017-11-09 DIAGNOSIS — G3184 Mild cognitive impairment, so stated: Secondary | ICD-10-CM

## 2017-11-09 DIAGNOSIS — R413 Other amnesia: Secondary | ICD-10-CM

## 2017-11-09 DIAGNOSIS — G453 Amaurosis fugax: Secondary | ICD-10-CM | POA: Diagnosis not present

## 2017-11-09 DIAGNOSIS — G43109 Migraine with aura, not intractable, without status migrainosus: Secondary | ICD-10-CM

## 2017-11-09 DIAGNOSIS — R269 Unspecified abnormalities of gait and mobility: Secondary | ICD-10-CM

## 2017-11-09 NOTE — Patient Instructions (Signed)
-   start aspirin 81mg daily

## 2017-11-09 NOTE — Progress Notes (Signed)
[name]  PATIENT: Lee Nelson DOB: November 20, 1940  REFERRING CLINICIAN: Avva HISTORY FROM: patient  REASON FOR VISIT: follow up   HISTORICAL  CHIEF COMPLAINT:  Chief Complaint  Patient presents with  . Gait Problem    rm 7 MMSE 28  . Memory Loss  . Follow-up    6 month    HISTORY OF PRESENT ILLNESS:   UPDATE (11/09/17, VRP): Since last visit, doing about the same. Symptoms are stable. Severity is mild. No alleviating or aggravating factors. Tolerating supplements. PT exercises helped, but now recovering from hernia 10/16/17. Also had transient right eye vision changes in May 2019 (shade over the right eye) x 1 minute. No recurrence.  UPDATE (05/07/17, VRP): Since last visit, doing about the same. Tolerating meds. No alleviating or aggravating factors. Has had more falls and balance issues. More memory loss issues.   UPDATE 12/30/16: Since last visit, continues with word finding diff. Also with intermittent right sided headaches, intermittent double vision, and neck pain. Avg 1-2 days per week of headache.   UPDATE 03/10/14: Since last visit, no more double vision attacks. Notes some work finding diff. Still with stress from his wife's quadriplegia and 24 hour care at home.   PRIOR HPI (09/07/13): 77 year old right-handed male with history of neck pain, back pain, skin cancer, reflux disease, osteoporosis, anxiety, fibromyalgia, migraine, here for evaluation of headaches and transient double vision. Previous evaluated patient in 2012 for right arm numbness. Patient reports over ten-year history of intermittent neck pain. Patient has some degenerative cervical spine disease managed conservatively. Patient has one to 2 year history of intermittent headaches mainly in the right parietal and right occipital region. Some pain radiates from the neck up to the head. No nausea or vomiting. No photophobia or phonophobia. Sometimes he has sharp shooting brief pains. He rates  pain severity 2-3/10. More recently headache symptoms have been more constant. No visual symptoms associated with headache. Separately patient has intermittent visual disturbance where he sees "swimming lines" in front of him, but these are not associated with headache. 10 days ago patient was at a meeting talking to some people, when he had sudden onset of vertical double vision lasting for one to 2 minutes. He had mild queasiness sensation. No vomiting. He had mild equilibrium problem without falling out of his chair. No slurred speech. No numbness or tingling. Symptoms resolved on their own. No headaches associated with this.   REVIEW OF SYSTEMS: Full 14 system review of systems performed and negative except: joint pain anxiety dizziness freq urination.   ALLERGIES: Allergies  Allergen Reactions  . Ciprofloxacin     JOINT PAIN  . Flagyl [Metronidazole] Other (See Comments)    REACTION: no appetite, diarrhea after meal, decrease in weight  . Metoclopramide Hcl Other (See Comments)    REACTION: "involuntary movements"  . Septra [Sulfamethoxazole-Trimethoprim] Other (See Comments)    REACTION: "involuntary movements" tripac antibiotic- heart rythm  . Silodosin     ? Possibly allergy, could not breathe well out of nose    HOME MEDICATIONS: Outpatient Medications Prior to Visit  Medication Sig Dispense Refill  . ALPRAZolam (XANAX) 0.25 MG tablet Take 0.175 mg by mouth 2 (two) times daily as needed for sleep (only takes .175 mg at night).     Marland Kitchen L-Tyrosine 500 MG CAPS Take by mouth daily.    Marland Kitchen MAGNESIUM CITRATE PO Take 75 mg by mouth daily.    . Phosphatidylserine 100 MG CAPS Take 100 mg by  mouth every morning.     Marland Kitchen UNABLE TO FIND Med Name: suntheanine 125 mg 2x3/day    . Cholecalciferol (VITAMIN D-3) 1000 units CAPS Take 2,000 Units by mouth daily.     . Cranberry 400 MG CAPS Take 400-1,200 mg by mouth every morning.     . Ginseng 100 MG CAPS Take by mouth.    Marland Kitchen  HYDROcodone-acetaminophen (NORCO/VICODIN) 5-325 MG tablet 1 po q d prn pain (Patient not taking: Reported on 11/09/2017) 30 tablet 0  . loratadine (CLARITIN) 10 MG tablet Take 10 mg by mouth every morning.    . Menaquinone-7 (VITAMIN K2 PO) Take 120-240 mg by mouth daily. Combo w/ Vit. D    . Milk Thistle 175 MG CAPS Take 1 capsule by mouth daily.     . Multiple Vitamin (MULTIVITAMIN) capsule Take 1 capsule by mouth every morning.     Marland Kitchen OVER THE COUNTER MEDICATION Take 3 capsules by mouth 2 (two) times daily. Lion's mane 0.5 gram/capsule    . OVER THE COUNTER MEDICATION Take 2 capsules by mouth every morning. Reparagen supplement    . OVER THE COUNTER MEDICATION Take 1 tablet by mouth daily as needed (for eye migraine). Mygra Few    . OVER THE COUNTER MEDICATION CocoaVia flavonoids    . OVER THE COUNTER MEDICATION Aloe, Mucilaginous, Polysaccheride, L-glutinine--- takes x1 50mg  each twice daily for constipation    . OVER THE COUNTER MEDICATION Tranquil Sleep (5HDP, melatonin)  Takes bedtime as needed    . OVER THE COUNTER MEDICATION Sinus Clear Herb PO-- takes as needed    . Probiotic Product (PROBIOTIC DAILY PO) Take by mouth.    . sodium chloride (MURO 128) 5 % ophthalmic ointment Place 1 application into both eyes 2 (two) times daily.     . THEANINE PO Take 2 tablets by mouth 2 (two) times daily.     . Turmeric, Curcuma Longa, (CURCUMIN) POWD 220 mg by Does not apply route.    . vitamin C (ASCORBIC ACID) 250 MG tablet Take 250 mg by mouth daily.     No facility-administered medications prior to visit.     PAST MEDICAL HISTORY: Past Medical History:  Diagnosis Date  . Anxiety   . Arthritis   . Bladder calculus   . BPH (benign prostatic hyperplasia)   . Chronic constipation   . Complication of anesthesia    " I had some coughing afterwards for a couple of days"--  per pt "perfers spinal anesthesia since general anesthesia congnitive issues when older"  . Diverticulosis of colon   .  Dry eye syndrome of both eyes   . Environmental allergies   . GERD (gastroesophageal reflux disease)    occasional  . History of adenomatous polyp of colon    08/ 2004  . History of kidney stones   . History of squamous cell carcinoma in situ (SCCIS) of skin    s/p  excision 2013 facial areas and 06/ 2016 nose  . Migraine    eye migraine occasional  . Seasonal and perennial allergic rhinitis   . Thrombocytopenia (HCC)   . Tingling    hands and feet bilat , intermittantly-- per pt has lumbar bulging disk  . Urinary frequency   . Vocal fold atrophy    dysphonia-- per pt has to drink large amount of water to take even on pill  . Wears glasses     PAST SURGICAL HISTORY: Past Surgical History:  Procedure Laterality Date  . APPENDECTOMY  child  . CARDIOVASCULAR STRESS TEST  05-17-2015  dr hilty   Low risk nuclear study w/ no ischemia/  normal LV function and wall motion , stress ef 54% (lvef 45-54%)  . CHOLECYSTECTOMY N/A 09/29/2014   Procedure: LAPAROSCOPIC CHOLECYSTECTOMY WITH INTRAOPERATIVE CHOLANGIOGRAM;  Surgeon: Ovidio Kin, MD;  Location: [facility];  Service: General;  Laterality: N/A;  . COLONOSCOPY  last one 09-06-2010  . ESOPHAGOGASTRODUODENOSCOPY N/A 12/09/2013   Procedure: ESOPHAGOGASTRODUODENOSCOPY (EGD);  Surgeon: Willis Modena, MD;  Location: [facility];  Service: Endoscopy;  Laterality: N/A;  . EXTRACORPOREAL SHOCK WAVE LITHOTRIPSY  yrs ago  . INGUINAL HERNIA REPAIR Left child  . NISSEN FUNDOPLICATION  1980's   open  . STONE EXTRACTION WITH BASKET N/A 08/18/2016   Procedure: STONE EXTRACTION WITH BASKET;  Surgeon: Jethro Bolus, MD;  Location: [facility];  Service: Urology;  Laterality: N/A;  . THULIUM LASER TURP (TRANSURETHRAL RESECTION OF PROSTATE) N/A 08/18/2016   Procedure: THULIUM LASER BLADDER NECK INCISION AND BLADDER STONE REMOVAL;  Surgeon: Jethro Bolus, MD;  Location: [facility];  Service: Urology;  Laterality: N/A;    . TONSILLECTOMY  child  . TRANSTHORACIC ECHOCARDIOGRAM  11/18/2010   grade 1 diastolic dysfunction, ef 55-60%/  trivial MR and TR/ mild dilated RA    FAMILY HISTORY: Family History  Problem Relation Age of Onset  . Parkinsonism Brother   . COPD Mother   . Allergies Mother   . Heart failure Mother   . COPD Father   . Stroke Father   . Other Sister   . Suicidality Maternal Aunt   . Cancer Maternal Grandfather     SOCIAL HISTORY:  Social History   Socioeconomic History  . Marital status: Married    Spouse name: Gavin Pound  . Number of children: 1  . Years of education: [place], [place], [place]  . Highest education level: Not on file  Occupational History  . Occupation: teaches constitutional Social worker  . Occupation: Photographer: Conservator, museum/gallery S[employer]  Social Needs  . Financial resource strain: Not on file  . Food insecurity:    Worry: Not on file    Inability: Not on file  . Transportation needs:    Medical: Not on file    Non-medical: Not on file  Tobacco Use  . Smoking status: Never Smoker  . Smokeless tobacco: Never Used  Substance and Sexual Activity  . Alcohol use: Yes    Comment: social  . Drug use: No  . Sexual activity: Not on file  Lifestyle  . Physical activity:    Days per week: Not on file    Minutes per session: Not on file  . Stress: Not on file  Relationships  . Social connections:    Talks on phone: Not on file    Gets together: Not on file    Attends religious service: Not on file    Active member of club or organization: Not on file    Attends meetings of clubs or organizations: Not on file    Relationship status: Not on file  . Intimate partner violence:    Fear of current or ex partner: Not on file    Emotionally abused: Not on file    Physically abused: Not on file    Forced sexual activity: Not on file  Other Topics Concern  . Not on file  Social History Narrative   Patient lives at home wife.   Daily caffeine use  PHYSICAL  EXAM  GENERAL EXAM/CONSTITUTIONAL: Vitals:  Vitals:   11/09/17 1343  BP: (!) 92/50  Pulse: 62  Weight: 120 lb 3.2 oz (54.5 kg)  Height: 5\' 8"  (1.727 m)     Body mass index is 18.28 kg/m. Wt Readings from Last 3 Encounters:  11/09/17 120 lb 3.2 oz (54.5 kg)  08/25/17 119 lb 6.4 oz (54.2 kg)  05/08/17 117 lb 6.4 oz (53.3 kg)     Patient is in no distress; well developed, nourished and groomed; neck is supple  CARDIOVASCULAR:  Examination of carotid arteries is normal; no carotid bruits  Regular rate and rhythm, no murmurs  Examination of peripheral vascular system by observation and palpation is normal  EYES:  Ophthalmoscopic exam of optic discs and posterior segments is normal; no papilledema or hemorrhages  No exam data present  MUSCULOSKELETAL:  Gait, strength, tone, movements noted in Neurologic exam below  NEUROLOGIC: MENTAL STATUS:  MMSE - Mini Mental State Exam 11/09/2017 05/08/2017  Orientation to time 5 4  Orientation to Place 4 5  Registration 3 3  Attention/ Calculation 4 1  Recall 3 2  Language- name 2 objects 2 2  Language- repeat 1 1  Language- follow 3 step command 3 3  Language- read & follow direction 1 1  Write a sentence 1 1  Copy design 1 0  Total score 28 23    awake, alert, oriented to person, place and time  recent and remote memory intact  normal attention and concentration  language fluent, comprehension intact, naming intact  fund of knowledge appropriate  CRANIAL NERVE:   2nd - no papilledema on fundoscopic exam  2nd, 3rd, 4th, 6th - pupils equal and reactive to light, visual fields full to confrontation, extraocular muscles intact, no nystagmus  5th - facial sensation symmetric  7th - facial strength symmetric  8th - hearing intact  9th - palate elevates symmetrically, uvula midline  11th - shoulder shrug symmetric  12th - tongue protrusion midline  MOTOR:   normal bulk and tone, full strength in the BUE,  BLE  MILD BRADYKINESIA IN BUE / BLE  SENSORY:   normal and symmetric to light touch, temperature, vibration  COORDINATION:   finger-nose-finger, fine finger movements normal  REFLEXES:   deep tendon reflexes --> BUE 1-2; BLE 2-3; symmetric  GAIT/STATION:   narrow based gait; STOOPED POSTURE; SLIGHT STAGGERING GAIT       DIAGNOSTIC DATA (LABS, IMAGING, TESTING) - I reviewed patient records, labs, notes, testing and imaging myself where available.  Lab Results  Component Value Date   WBC 5.4 04/19/2017   HGB 14.0 04/19/2017   HCT 39.7 04/19/2017   MCV 95.0 04/19/2017   PLT 100 (L) 04/19/2017      Component Value Date/Time   NA 136 04/19/2017 1606   K 3.9 04/19/2017 1606   CL 104 04/19/2017 1606   CO2 27 04/19/2017 1606   GLUCOSE 119 (H) 04/19/2017 1606   BUN 20 04/19/2017 1606   CREATININE 0.87 04/19/2017 1606   CALCIUM 8.9 04/19/2017 1606   PROT 6.3 (L) 11/20/2014 2140   ALBUMIN 3.9 11/20/2014 2140   AST 23 11/20/2014 2140   ALT 16 (L) 11/20/2014 2140   ALKPHOS 82 11/20/2014 2140   BILITOT 0.4 11/20/2014 2140   GFRNONAA >60 04/19/2017 1606   GFRAA >60 04/19/2017 1606   No results found for: CHOL No results found for: HGBA1C Lab Results  Component Value Date   VITAMINB12 711 05/08/2017  No results found for: TSH  07/20/10 MRI CERVICAL 1. Left C6-C7 paracentral disc protrusion abutting the exiting left C7 nerve roots appears new. Preexisting mild left C7 foraminal stenosis at this level due to facet hypertrophy.  2. Mild combined congenital and acquired spinal stenosis at the C3-C4 and C4-C5.  11/20/10 MRI brain (without contrast) demonstrating: 1. Moderate ventriculomegaly in the temporal and occipital horns.  Likely due to subcortical atrophy. 2. No acute findings are seen.  11/20/10 MRA head (without contrast) demonstrating: - mild atheromatous irregularities in the bilateral carotid siphon regions.  11/19/10 EMG/NCS - normal  11/19/10 carotid  u/s - normal  11/18/10 TTE - EF 55-60%; normal wall motion  09/19/13 MRI brain (with and without) demonstrating: 1. Moderate mesial temporal atrophy. 2. Moderate ventriculomegaly on ex vacuo basis. 3. No acute findings.  11/11/16 MRI brain [I reviewed images myself and agree with interpretation. -VRP]  1. No acute intracranial process. 2. Progressed nonspecific moderate to severe parenchymal brain volume loss for age. No hydrocephalus.  02/02/17 MRI cervical spine [I reviewed images myself and agree with interpretation. -VRP]  1. At C3-C4, there is borderline spinal stenosis due to ligamentum flavum hypertrophy and congenitally short pedicles.   There is no nerve root compression. 2. At C4-C5, there is mild spinal stenosis due to disc bulging, ligament of flavum hypertrophy, facet hypertrophy, mild uncovertebral spurring and congenitally short pedicles. There is no nerve root compression. Degenerative changes at this level have slightly progressed compared to the 07/20/2010 MRI. 3. At C5-C6, there are degenerative changes no nerve root compression or spinal stenosis 4. At C6-C7, there are degenerative changes causing mild foraminal narrowing but no nerve root compression.   On the previous MRI there was a disc protrusion to the left that is not appreciated on the current study. 5. The spinal cord has normal signal.  08/11/17 CAROTID U/S Right Carotid: There was no evidence of thrombus, dissection, atherosclerotic plaque or stenosis in the cervical carotid system.  Left Carotid: Velocities in the left ICA are consistent with a 1-39% stenosis.  Vertebrals: Bilateral vertebral arteries demonstrate antegrade flow. Subclavians: Normal flow hemodynamics were seen in bilateral subclavian arteries.     ASSESSMENT AND PLAN  77 y.o. year old male here with chronic neck pain x 10 years, int HA x 1-2 years, with a 2 minute episode of vertical double vision in June 2015, with no recurrence.   Also  with gait diff and memory loss and weight loss.  Ddx: neurodegenerative (PD plus), stiff-person syndrome, B12 deficiency, deconditioning, muscle atrophy  Amaurosis fugax of right eye  Gait difficulty  Migraine with aura and without status migrainosus, not intractable  Memory loss  MCI (mild cognitive impairment)     PLAN:  GAIT DIFFICULTY / HYPERREFLEXIA / SPINAL STENOSIS - stable; continue exercises; use cane / walker if needed  WORD FINDING DIFFICULTY / MCI - stable; continue good nutrition, exercises, brain stimulating activities  TIA (right eye amaurosis fugax; May 2019) - advised to take aspirin 81mg  daily, but caution with history of low platelets and current curcumin use  MIGRAINE WITH AURA - improved; monitor for now (did not tolerated amitriptyline)  Return if symptoms worsen or fail to improve, for return to PCP.    Suanne MarkerVIKRAM R. Wrenly Lauritsen, MD 11/09/2017, 2:06 PM Certified in Neurology, Neurophysiology and Neuroimaging  [facility] [address], [address] [place], [place] [address] [phone]

## 2017-12-11 DIAGNOSIS — M6281 Muscle weakness (generalized): Secondary | ICD-10-CM | POA: Diagnosis not present

## 2017-12-11 DIAGNOSIS — G9009 Other idiopathic peripheral autonomic neuropathy: Secondary | ICD-10-CM | POA: Diagnosis not present

## 2017-12-11 DIAGNOSIS — R3912 Poor urinary stream: Secondary | ICD-10-CM | POA: Diagnosis not present

## 2017-12-11 DIAGNOSIS — M62838 Other muscle spasm: Secondary | ICD-10-CM | POA: Diagnosis not present

## 2017-12-30 DIAGNOSIS — R1312 Dysphagia, oropharyngeal phase: Secondary | ICD-10-CM | POA: Diagnosis not present

## 2017-12-30 DIAGNOSIS — J040 Acute laryngitis: Secondary | ICD-10-CM | POA: Diagnosis not present

## 2017-12-30 DIAGNOSIS — K210 Gastro-esophageal reflux disease with esophagitis: Secondary | ICD-10-CM | POA: Diagnosis not present

## 2017-12-30 DIAGNOSIS — J039 Acute tonsillitis, unspecified: Secondary | ICD-10-CM | POA: Diagnosis not present

## 2017-12-31 ENCOUNTER — Other Ambulatory Visit (HOSPITAL_COMMUNITY): Payer: Self-pay | Admitting: Otolaryngology

## 2017-12-31 DIAGNOSIS — R1312 Dysphagia, oropharyngeal phase: Secondary | ICD-10-CM

## 2017-12-31 DIAGNOSIS — J040 Acute laryngitis: Secondary | ICD-10-CM

## 2018-01-06 ENCOUNTER — Ambulatory Visit (HOSPITAL_COMMUNITY): Payer: BLUE CROSS/BLUE SHIELD

## 2018-01-08 DIAGNOSIS — M62838 Other muscle spasm: Secondary | ICD-10-CM | POA: Diagnosis not present

## 2018-01-08 DIAGNOSIS — R102 Pelvic and perineal pain: Secondary | ICD-10-CM | POA: Diagnosis not present

## 2018-01-08 DIAGNOSIS — M6281 Muscle weakness (generalized): Secondary | ICD-10-CM | POA: Diagnosis not present

## 2018-01-08 DIAGNOSIS — K5900 Constipation, unspecified: Secondary | ICD-10-CM | POA: Diagnosis not present

## 2018-01-11 ENCOUNTER — Other Ambulatory Visit (HOSPITAL_COMMUNITY): Payer: Self-pay | Admitting: Otolaryngology

## 2018-01-11 DIAGNOSIS — J312 Chronic pharyngitis: Secondary | ICD-10-CM

## 2018-01-11 DIAGNOSIS — R1312 Dysphagia, oropharyngeal phase: Secondary | ICD-10-CM

## 2018-01-13 DIAGNOSIS — J040 Acute laryngitis: Secondary | ICD-10-CM | POA: Diagnosis not present

## 2018-01-16 ENCOUNTER — Ambulatory Visit (HOSPITAL_COMMUNITY)
Admission: RE | Admit: 2018-01-16 | Discharge: 2018-01-16 | Disposition: A | Discharge status: Home / Self Care | Payer: BLUE CROSS/BLUE SHIELD | Source: Ambulatory Visit | Attending: Otolaryngology | Admitting: Otolaryngology

## 2018-01-16 ENCOUNTER — Encounter (HOSPITAL_COMMUNITY): Payer: Self-pay

## 2018-01-16 DIAGNOSIS — J312 Chronic pharyngitis: Secondary | ICD-10-CM

## 2018-01-16 DIAGNOSIS — R1312 Dysphagia, oropharyngeal phase: Secondary | ICD-10-CM

## 2018-01-16 NOTE — Progress Notes (Signed)
Attempted pt MRI of cspine on 01-16-18 at 1030am. Pt stated he needed ear plugs and headphones which we are unable to use due to the neck piece we have to fit around him , our headphone will not fit. We informed pt we will use ear plugs and lots of pads neck to ears which will be the same as the headphones. Once pt was about to go in scanner he stated he could hear everything the same as if he didn't have any hearing protection. He was concerned it would not be enough to protect hearing. We informed him the ear plugs are enough and that it will not eliminate the noise just muffle it. He stated he wants to go somewhere else that will use the headphones with the ear plugs. Pt exam was canceled.

## 2018-01-20 DIAGNOSIS — J370 Chronic laryngitis: Secondary | ICD-10-CM | POA: Diagnosis not present

## 2018-01-29 ENCOUNTER — Other Ambulatory Visit (HOSPITAL_COMMUNITY): Payer: Self-pay | Admitting: Otolaryngology

## 2018-01-29 ENCOUNTER — Ambulatory Visit (HOSPITAL_COMMUNITY)
Admission: RE | Admit: 2018-01-29 | Discharge: 2018-01-29 | Disposition: A | Discharge status: Home / Self Care | Payer: BLUE CROSS/BLUE SHIELD | Source: Ambulatory Visit | Attending: Otolaryngology | Admitting: Otolaryngology

## 2018-01-29 DIAGNOSIS — R1312 Dysphagia, oropharyngeal phase: Secondary | ICD-10-CM | POA: Diagnosis not present

## 2018-01-29 DIAGNOSIS — M4802 Spinal stenosis, cervical region: Secondary | ICD-10-CM | POA: Diagnosis not present

## 2018-01-29 DIAGNOSIS — M47812 Spondylosis without myelopathy or radiculopathy, cervical region: Secondary | ICD-10-CM | POA: Diagnosis not present

## 2018-02-01 ENCOUNTER — Other Ambulatory Visit: Payer: Self-pay | Admitting: Gastroenterology

## 2018-02-01 DIAGNOSIS — R1310 Dysphagia, unspecified: Secondary | ICD-10-CM | POA: Diagnosis not present

## 2018-02-01 DIAGNOSIS — K219 Gastro-esophageal reflux disease without esophagitis: Secondary | ICD-10-CM | POA: Diagnosis not present

## 2018-02-19 DIAGNOSIS — M6281 Muscle weakness (generalized): Secondary | ICD-10-CM | POA: Diagnosis not present

## 2018-02-19 DIAGNOSIS — R102 Pelvic and perineal pain: Secondary | ICD-10-CM | POA: Diagnosis not present

## 2018-02-19 DIAGNOSIS — M62838 Other muscle spasm: Secondary | ICD-10-CM | POA: Diagnosis not present

## 2018-02-19 DIAGNOSIS — R3914 Feeling of incomplete bladder emptying: Secondary | ICD-10-CM | POA: Diagnosis not present

## 2018-03-05 DIAGNOSIS — R351 Nocturia: Secondary | ICD-10-CM | POA: Diagnosis not present

## 2018-03-05 DIAGNOSIS — N401 Enlarged prostate with lower urinary tract symptoms: Secondary | ICD-10-CM | POA: Diagnosis not present

## 2018-03-05 DIAGNOSIS — N3281 Overactive bladder: Secondary | ICD-10-CM | POA: Diagnosis not present

## 2018-03-05 DIAGNOSIS — N200 Calculus of kidney: Secondary | ICD-10-CM | POA: Diagnosis not present

## 2018-03-17 ENCOUNTER — Encounter (HOSPITAL_COMMUNITY): Payer: Self-pay

## 2018-03-17 ENCOUNTER — Ambulatory Visit (HOSPITAL_COMMUNITY): Admit: 2018-03-17 | Payer: BLUE CROSS/BLUE SHIELD | Admitting: Gastroenterology

## 2018-03-17 DIAGNOSIS — Z85828 Personal history of other malignant neoplasm of skin: Secondary | ICD-10-CM | POA: Diagnosis not present

## 2018-03-17 DIAGNOSIS — L821 Other seborrheic keratosis: Secondary | ICD-10-CM | POA: Diagnosis not present

## 2018-03-17 DIAGNOSIS — I781 Nevus, non-neoplastic: Secondary | ICD-10-CM | POA: Diagnosis not present

## 2018-03-17 SURGERY — ESOPHAGOGASTRODUODENOSCOPY (EGD) WITH PROPOFOL
Anesthesia: Monitor Anesthesia Care

## 2018-03-26 DIAGNOSIS — R3915 Urgency of urination: Secondary | ICD-10-CM | POA: Diagnosis not present

## 2018-03-26 DIAGNOSIS — R3912 Poor urinary stream: Secondary | ICD-10-CM | POA: Diagnosis not present

## 2018-03-26 DIAGNOSIS — M6281 Muscle weakness (generalized): Secondary | ICD-10-CM | POA: Diagnosis not present

## 2018-03-26 DIAGNOSIS — M62838 Other muscle spasm: Secondary | ICD-10-CM | POA: Diagnosis not present

## 2018-04-29 DIAGNOSIS — R102 Pelvic and perineal pain: Secondary | ICD-10-CM | POA: Diagnosis not present

## 2018-04-29 DIAGNOSIS — R3912 Poor urinary stream: Secondary | ICD-10-CM | POA: Diagnosis not present

## 2018-04-29 DIAGNOSIS — M62838 Other muscle spasm: Secondary | ICD-10-CM | POA: Diagnosis not present

## 2018-04-29 DIAGNOSIS — M6281 Muscle weakness (generalized): Secondary | ICD-10-CM | POA: Diagnosis not present

## 2018-05-25 DIAGNOSIS — M62838 Other muscle spasm: Secondary | ICD-10-CM | POA: Diagnosis not present

## 2018-05-25 DIAGNOSIS — R102 Pelvic and perineal pain: Secondary | ICD-10-CM | POA: Diagnosis not present

## 2018-05-25 DIAGNOSIS — M6281 Muscle weakness (generalized): Secondary | ICD-10-CM | POA: Diagnosis not present

## 2018-05-25 DIAGNOSIS — K5900 Constipation, unspecified: Secondary | ICD-10-CM | POA: Diagnosis not present

## 2018-06-01 ENCOUNTER — Encounter: Payer: Self-pay | Admitting: Diagnostic Neuroimaging

## 2018-06-01 ENCOUNTER — Ambulatory Visit (INDEPENDENT_AMBULATORY_CARE_PROVIDER_SITE_OTHER): Payer: BLUE CROSS/BLUE SHIELD | Admitting: Diagnostic Neuroimaging

## 2018-06-01 VITALS — BP 99/46 | HR 53 | Ht 68.0 in | Wt 118.6 lb

## 2018-06-01 DIAGNOSIS — R269 Unspecified abnormalities of gait and mobility: Secondary | ICD-10-CM

## 2018-06-01 DIAGNOSIS — G3184 Mild cognitive impairment, so stated: Secondary | ICD-10-CM

## 2018-06-01 NOTE — Progress Notes (Signed)
[name]  PATIENT: Lee Nelson DOB: 11/01/1940  REFERRING CLINICIAN: Avva HISTORY FROM: patient  REASON FOR VISIT: follow up   HISTORICAL  CHIEF COMPLAINT:  Chief Complaint  Patient presents with  . Memory Loss    rm 6, MMSE 29  "my memory is getting worse; ringing in my ears; steady discomfort in my head on upper right side"     HISTORY OF PRESENT ILLNESS:   UPDATE (06/01/18, VRP): Since last visit, noting more memory issues. Has missed a meeting. Some words are jumbled. Symptoms are progressive. Insomnia and anxiety have worsened.   UPDATE (11/09/17, VRP): Since last visit, doing about the same. Symptoms are stable. Severity is mild. No alleviating or aggravating factors. Tolerating supplements. PT exercises helped, but now recovering from hernia 10/16/17. Also had transient right eye vision changes in May 2019 (shade over the right eye) x 1 minute. No recurrence.  UPDATE (05/07/17, VRP): Since last visit, doing about the same. Tolerating meds. No alleviating or aggravating factors. Has had more falls and balance issues. More memory loss issues.   UPDATE 12/30/16: Since last visit, continues with word finding diff. Also with intermittent right sided headaches, intermittent double vision, and neck pain. Avg 1-2 days per week of headache.   UPDATE 03/10/14: Since last visit, no more double vision attacks. Notes some work finding diff. Still with stress from his wife's quadriplegia and 24 hour care at home.   PRIOR HPI (09/07/13): 78 year old right-handed male with history of neck pain, back pain, skin cancer, reflux disease, osteoporosis, anxiety, fibromyalgia, migraine, here for evaluation of headaches and transient double vision. Previous evaluated patient in 2012 for right arm numbness. Patient reports over ten-year history of intermittent neck pain. Patient has some degenerative cervical spine disease managed conservatively. Patient has one to 2 year history  of intermittent headaches mainly in the right parietal and right occipital region. Some pain radiates from the neck up to the head. No nausea or vomiting. No photophobia or phonophobia. Sometimes he has sharp shooting brief pains. He rates pain severity 2-3/10. More recently headache symptoms have been more constant. No visual symptoms associated with headache. Separately patient has intermittent visual disturbance where he sees "swimming lines" in front of him, but these are not associated with headache. 10 days ago patient was at a meeting talking to some people, when he had sudden onset of vertical double vision lasting for one to 2 minutes. He had mild queasiness sensation. No vomiting. He had mild equilibrium problem without falling out of his chair. No slurred speech. No numbness or tingling. Symptoms resolved on their own. No headaches associated with this.   REVIEW OF SYSTEMS: Full 14 system review of systems performed and negative except: dizzinesss anxiety fatigue constipation insomnia freq waking neck pain joint pain.   ALLERGIES: Allergies  Allergen Reactions  . Ciprofloxacin     JOINT PAIN  . Flagyl [Metronidazole] Other (See Comments)    REACTION: no appetite, diarrhea after meal, decrease in weight  . Metoclopramide Hcl Other (See Comments)    REACTION: "involuntary movements"  . Septra [Sulfamethoxazole-Trimethoprim] Other (See Comments)    REACTION: "involuntary movements" tripac antibiotic- heart rythm  . Silodosin     ? Possibly allergy, could not breathe well out of nose    HOME MEDICATIONS: Outpatient Medications Prior to Visit  Medication Sig Dispense Refill  . ALPRAZolam (XANAX) 0.25 MG tablet Take 0.175 mg by mouth 2 (two) times daily as needed for sleep (only takes .175  mg at night).     Marland Kitchen b complex vitamins capsule Take 1 capsule by mouth daily.    . Cholecalciferol (VITAMIN D-3) 1000 units CAPS Take 2,000 Units by mouth daily.     . Cranberry 400 MG CAPS Take  400-1,200 mg by mouth every morning.     . Ginseng 100 MG CAPS Take by mouth.    Marland Kitchen HYDROcodone-acetaminophen (NORCO/VICODIN) 5-325 MG tablet 1 po q d prn pain 30 tablet 0  . L-Tyrosine 500 MG CAPS Take by mouth daily.    Marland Kitchen loratadine (CLARITIN) 10 MG tablet Take 10 mg by mouth every morning.    Marland Kitchen MAGNESIUM CITRATE PO Take 75 mg by mouth daily.    . Menaquinone-7 (VITAMIN K2 PO) Take 120-240 mg by mouth daily. Combo w/ Vit. D    . Milk Thistle 175 MG CAPS Take 1 capsule by mouth daily.     . Multiple Vitamin (MULTIVITAMIN) capsule Take 1 capsule by mouth every morning.     Marland Kitchen OVER THE COUNTER MEDICATION Take 3 capsules by mouth 2 (two) times daily. Lion's mane 0.5 gram/capsule    . OVER THE COUNTER MEDICATION Take 2 capsules by mouth every morning. Reparagen supplement    . OVER THE COUNTER MEDICATION Take 1 tablet by mouth daily as needed (for eye migraine). Mygra Few    . OVER THE COUNTER MEDICATION CocoaVia flavonoids    . OVER THE COUNTER MEDICATION Aloe, Mucilaginous, Polysaccheride, L-glutinine--- takes x1  each twice daily for constipation    . OVER THE COUNTER MEDICATION Tranquil Sleep (5HDP, melatonin)  Takes bedtime as needed    . OVER THE COUNTER MEDICATION Sinus Clear Herb PO-- takes as needed    . Phosphatidylserine 100 MG CAPS Take 100 mg by mouth every morning.     . Probiotic Product (PROBIOTIC DAILY PO) Take by mouth.    . sodium chloride (MURO 128) 5 % ophthalmic ointment Place 1 application into both eyes 2 (two) times daily.     . THEANINE PO Take 2 tablets by mouth 2 (two) times daily.     . Turmeric, Curcuma Longa, (CURCUMIN) POWD 220 mg by Does not apply route.    Marland Kitchen UNABLE TO FIND Med Name: suntheanine 125 mg 2x3/day    . UNABLE TO FIND Med Name: Acetyl-L-Carnitine    . vitamin C (ASCORBIC ACID) 250 MG tablet Take 250 mg by mouth daily.     No facility-administered medications prior to visit.     PAST MEDICAL HISTORY: Past Medical History:  Diagnosis Date  .  Anxiety   . Arthritis   . Bladder calculus   . BPH (benign prostatic hyperplasia)   . Chronic constipation   . Complication of anesthesia    " I had some coughing afterwards for a couple of days"--  per pt "perfers spinal anesthesia since general anesthesia congnitive issues when older"  . Diverticulosis of colon   . Dry eye syndrome of both eyes   . Environmental allergies   . GERD (gastroesophageal reflux disease)    occasional  . History of adenomatous polyp of colon    08/ 2004  . History of kidney stones   . History of squamous cell carcinoma in situ (SCCIS) of skin    s/p  excision 2013 facial areas and 06/ 2016 nose  . Migraine    eye migraine occasional  . Seasonal and perennial allergic rhinitis   . Thrombocytopenia (HCC)   . Tingling    hands and  feet bilat , intermittantly-- per pt has lumbar bulging disk  . Urinary frequency   . Vocal fold atrophy    dysphonia-- per pt has to drink large amount of water to take even on pill  . Wears glasses     PAST SURGICAL HISTORY: Past Surgical History:  Procedure Laterality Date  . APPENDECTOMY  child  . CARDIOVASCULAR STRESS TEST  05-17-2015  dr hilty   Low risk nuclear study w/ no ischemia/  normal LV function and wall motion , stress ef 54% (lvef 45-54%)  . CHOLECYSTECTOMY N/A 09/29/2014   Procedure: LAPAROSCOPIC CHOLECYSTECTOMY WITH INTRAOPERATIVE CHOLANGIOGRAM;  Surgeon: Ovidio Kin, MD;  Location: [facility];  Service: General;  Laterality: N/A;  . COLONOSCOPY  last one 09-06-2010  . ESOPHAGOGASTRODUODENOSCOPY N/A 12/09/2013   Procedure: ESOPHAGOGASTRODUODENOSCOPY (EGD);  Surgeon: Willis Modena, MD;  Location: [facility];  Service: Endoscopy;  Laterality: N/A;  . EXTRACORPOREAL SHOCK WAVE LITHOTRIPSY  yrs ago  . INGUINAL HERNIA REPAIR Left child  . inguinal hernia repair  09/2017  . NISSEN FUNDOPLICATION  1980's   open  . STONE EXTRACTION WITH BASKET N/A 08/18/2016   Procedure: STONE EXTRACTION WITH BASKET;  Surgeon:  Jethro Bolus, MD;  Location: [facility];  Service: Urology;  Laterality: N/A;  . THULIUM LASER TURP (TRANSURETHRAL RESECTION OF PROSTATE) N/A 08/18/2016   Procedure: THULIUM LASER BLADDER NECK INCISION AND BLADDER STONE REMOVAL;  Surgeon: Jethro Bolus, MD;  Location: [facility];  Service: Urology;  Laterality: N/A;  . TONSILLECTOMY  child  . TRANSTHORACIC ECHOCARDIOGRAM  11/18/2010   grade 1 diastolic dysfunction, ef 55-60%/  trivial MR and TR/ mild dilated RA    FAMILY HISTORY: Family History  Problem Relation Age of Onset  . Parkinsonism Brother   . COPD Mother   . Allergies Mother   . Heart failure Mother   . COPD Father   . Stroke Father   . Other Sister   . Suicidality Maternal Aunt   . Cancer Maternal Grandfather     SOCIAL HISTORY:  Social History   Socioeconomic History  . Marital status: Married    Spouse name: Gavin Pound  . Number of children: 1  . Years of education: [place], [place], [place]  . Highest education level: Not on file  Occupational History  . Occupation: teaches constitutional Social worker  . Occupation: Photographer: Conservator, museum/gallery [employer]  Social Needs  . Financial resource strain: Not on file  . Food insecurity:    Worry: Not on file    Inability: Not on file  . Transportation needs:    Medical: Not on file    Non-medical: Not on file  Tobacco Use  . Smoking status: Never Smoker  . Smokeless tobacco: Never Used  Substance and Sexual Activity  . Alcohol use: Yes    Comment: social  . Drug use: No  . Sexual activity: Not on file  Lifestyle  . Physical activity:    Days per week: Not on file    Minutes per session: Not on file  . Stress: Not on file  Relationships  . Social connections:    Talks on phone: Not on file    Gets together: Not on file    Attends religious service: Not on file    Active member of club or organization: Not on file    Attends meetings of clubs or organizations: Not on file     Relationship status: Not on file  . Intimate partner  violence:    Fear of current or ex partner: Not on file    Emotionally abused: Not on file    Physically abused: Not on file    Forced sexual activity: Not on file  Other Topics Concern  . Not on file  Social History Narrative   Patient lives at home wife.   Daily caffeine use     PHYSICAL EXAM  GENERAL EXAM/CONSTITUTIONAL: Vitals:  Vitals:   06/01/18 0840  BP: (!) 99/46  Pulse: (!) 53  Weight: 118 lb 9.6 oz (53.8 kg)  Height:  (1.727 m)   Body mass index is 18.03 kg/m. Wt Readings from Last 3 Encounters:  06/01/18 118 lb 9.6 oz (53.8 kg)  11/09/17 120 lb 3.2 oz (54.5 kg)  08/25/17 119 lb 6.4 oz (54.2 kg)    Patient is in no distress; well developed, nourished and groomed; neck is supple  CARDIOVASCULAR:  Examination of carotid arteries is normal; no carotid bruits  Regular rate and rhythm, no murmurs  Examination of peripheral vascular system by observation and palpation is normal  EYES:  Ophthalmoscopic exam of optic discs and posterior segments is normal; no papilledema or hemorrhages No exam data present  MUSCULOSKELETAL:  Gait, strength, tone, movements noted in Neurologic exam below  NEUROLOGIC: MENTAL STATUS:  MMSE - Mini Mental State Exam 06/01/2018 11/09/2017 05/08/2017  Orientation to time Orientation to Place Registration Attention/ Calculation Recall Language- name 2 objects Language- repeat Language- follow 3 step command Language- read & follow direction Write a sentence Copy design 0 1 0  Total score awake, alert, oriented to person, place and time  recent and remote memory intact  normal attention and concentration  language fluent, comprehension intact, naming intact  fund of knowledge appropriate  CRANIAL NERVE:   2nd - no papilledema on fundoscopic exam  2nd, 3rd, 4th, 6th - pupils  equal and reactive to light, visual fields full to confrontation, extraocular muscles intact, no nystagmus  5th - facial sensation symmetric  7th - facial strength symmetric  8th - hearing intact  9th - palate elevates symmetrically, uvula midline  11th - shoulder shrug symmetric  12th - tongue protrusion midline  MOTOR:   normal bulk and tone, full strength in the BUE, BLE  MILD BRADYKINESIA IN BUE / BLE  SENSORY:   normal and symmetric to light touch, temperature, vibration  COORDINATION:   finger-nose-finger, fine finger movements normal  REFLEXES:   deep tendon reflexes --> BUE 1-2; BLE 2-3; symmetric  GAIT/STATION:   narrow based gait; STOOPED POSTURE; SLIGHT STAGGERING GAIT       DIAGNOSTIC DATA (LABS, IMAGING, TESTING) - I reviewed patient records, labs, notes, testing and imaging myself where available.  Lab Results  Component Value Date   WBC 5.4 04/19/2017   HGB 14.0 04/19/2017   HCT 39.7 04/19/2017   MCV 95.0 04/19/2017   PLT 100 (L) 04/19/2017      Component Value Date/Time   NA 136 04/19/2017 1606   K 3.9 04/19/2017 1606   CL 104 04/19/2017 1606   CO2 27 04/19/2017 1606   GLUCOSE 119 (H) 04/19/2017 1606   BUN 20 04/19/2017 1606   CREATININE 0.87 04/19/2017 1606   CALCIUM 8.9 04/19/2017  1606   PROT 6.3 (L) 11/20/2014 2140   ALBUMIN 3.9 11/20/2014 2140   AST 23 11/20/2014 2140   ALT 16 (L) 11/20/2014 2140   ALKPHOS 82 11/20/2014 2140   BILITOT 0.4 11/20/2014 2140   GFRNONAA >60 04/19/2017 1606   GFRAA >60 04/19/2017 1606   No results found for: CHOL No results found for: HGBA1C Lab Results  Component Value Date   VITAMINB12 711 05/08/2017   No results found for: TSH  07/20/10 MRI CERVICAL 1. Left C6-C7 paracentral disc protrusion abutting the exiting left C7 nerve roots appears new. Preexisting mild left C7 foraminal stenosis at this level due to facet hypertrophy.  2. Mild combined congenital and acquired spinal stenosis at  the C3-C4 and C4-C5.  11/20/10 MRI brain (without contrast) demonstrating: 1. Moderate ventriculomegaly in the temporal and occipital horns.  Likely due to subcortical atrophy. 2. No acute findings are seen.  11/20/10 MRA head (without contrast) demonstrating: - mild atheromatous irregularities in the bilateral carotid siphon regions.  11/19/10 EMG/NCS - normal  11/19/10 carotid u/s - normal  11/18/10 TTE - EF 55-60%; normal wall motion  09/19/13 MRI brain (with and without) demonstrating: 1. Moderate mesial temporal atrophy. 2. Moderate ventriculomegaly on ex vacuo basis. 3. No acute findings.  11/11/16 MRI brain [I reviewed images myself and agree with interpretation. -VRP]  1. No acute intracranial process. 2. Progressed nonspecific moderate to severe parenchymal brain volume loss for age. No hydrocephalus.  02/02/17 MRI cervical spine [I reviewed images myself and agree with interpretation. -VRP]  1. At C3-C4, there is borderline spinal stenosis due to ligamentum flavum hypertrophy and congenitally short pedicles.   There is no nerve root compression. 2. At C4-C5, there is mild spinal stenosis due to disc bulging, ligament of flavum hypertrophy, facet hypertrophy, mild uncovertebral spurring and congenitally short pedicles. There is no nerve root compression. Degenerative changes at this level have slightly progressed compared to the 07/20/2010 MRI. 3. At C5-C6, there are degenerative changes no nerve root compression or spinal stenosis 4. At C6-C7, there are degenerative changes causing mild foraminal narrowing but no nerve root compression.   On the previous MRI there was a disc protrusion to the left that is not appreciated on the current study. 5. The spinal cord has normal signal.  08/11/17 CAROTID U/S Right Carotid: There was no evidence of thrombus, dissection, atherosclerotic plaque or stenosis in the cervical carotid system.  Left Carotid: Velocities in the left ICA are  consistent with a 1-39% stenosis.  Vertebrals: Bilateral vertebral arteries demonstrate antegrade flow.  Subclavians: Normal flow hemodynamics were seen in bilateral subclavian arteries.    ASSESSMENT AND PLAN  78 y.o. year old male here with chronic neck pain x 10 years, int HA x 1-2 years, with a 2 minute episode of vertical double vision in June 2015, with no recurrence.   Also with gait diff and memory loss and weight loss.  Dx:  MCI (mild cognitive impairment)  Gait difficulty    PLAN:  WORD FINDING DIFFICULTY / MCI - stable; continue good nutrition, exercises, brain stimulating activities - improve nutrition, exercise, mood stabilization and insomnia - may consider memantine in future  GAIT DIFFICULTY / HYPERREFLEXIA / SPINAL STENOSIS - stable; continue exercises; use cane / walker if needed - consider PT evaluation  TIA (right eye amaurosis fugax; May 2019) - advised to take aspirin 81mg  daily, but caution with history of low platelets and current curcumin use  MIGRAINE WITH AURA - improved; monitor for  now (did not tolerated amitriptyline)  Return for return to PCP, pending if symptoms worsen or fail to improve.    Suanne Marker, MD 06/01/2018, 9:00 AM Certified in Neurology, Neurophysiology and Neuroimaging  [facility] [address] [place], [place] [address] [phone]

## 2018-06-03 DIAGNOSIS — R1312 Dysphagia, oropharyngeal phase: Secondary | ICD-10-CM | POA: Diagnosis not present

## 2018-06-03 DIAGNOSIS — J040 Acute laryngitis: Secondary | ICD-10-CM | POA: Diagnosis not present

## 2018-06-09 DIAGNOSIS — H2513 Age-related nuclear cataract, bilateral: Secondary | ICD-10-CM | POA: Diagnosis not present

## 2018-06-09 DIAGNOSIS — H02889 Meibomian gland dysfunction of unspecified eye, unspecified eyelid: Secondary | ICD-10-CM | POA: Diagnosis not present

## 2018-06-09 DIAGNOSIS — M65331 Trigger finger, right middle finger: Secondary | ICD-10-CM | POA: Diagnosis not present

## 2018-06-09 DIAGNOSIS — L718 Other rosacea: Secondary | ICD-10-CM | POA: Diagnosis not present

## 2018-06-11 DIAGNOSIS — R5383 Other fatigue: Secondary | ICD-10-CM | POA: Diagnosis not present

## 2018-06-11 DIAGNOSIS — Z125 Encounter for screening for malignant neoplasm of prostate: Secondary | ICD-10-CM | POA: Diagnosis not present

## 2018-06-11 DIAGNOSIS — M810 Age-related osteoporosis without current pathological fracture: Secondary | ICD-10-CM | POA: Diagnosis not present

## 2018-06-11 DIAGNOSIS — Z0000 Encounter for general adult medical examination without abnormal findings: Secondary | ICD-10-CM | POA: Diagnosis not present

## 2018-06-11 DIAGNOSIS — R82998 Other abnormal findings in urine: Secondary | ICD-10-CM | POA: Diagnosis not present

## 2018-06-14 DIAGNOSIS — Z0000 Encounter for general adult medical examination without abnormal findings: Secondary | ICD-10-CM | POA: Diagnosis not present

## 2018-06-14 DIAGNOSIS — M2550 Pain in unspecified joint: Secondary | ICD-10-CM | POA: Diagnosis not present

## 2018-06-14 DIAGNOSIS — R7989 Other specified abnormal findings of blood chemistry: Secondary | ICD-10-CM | POA: Diagnosis not present

## 2018-06-28 IMAGING — CR DG CHEST 2V
3 series · 3 of 3 positions shown · non-contrast
Comparison: May 07, 2014

CLINICAL DATA: Cough for 3 days.

EXAM:
CHEST  2 VIEW

[w chest lat (1 of 2)]
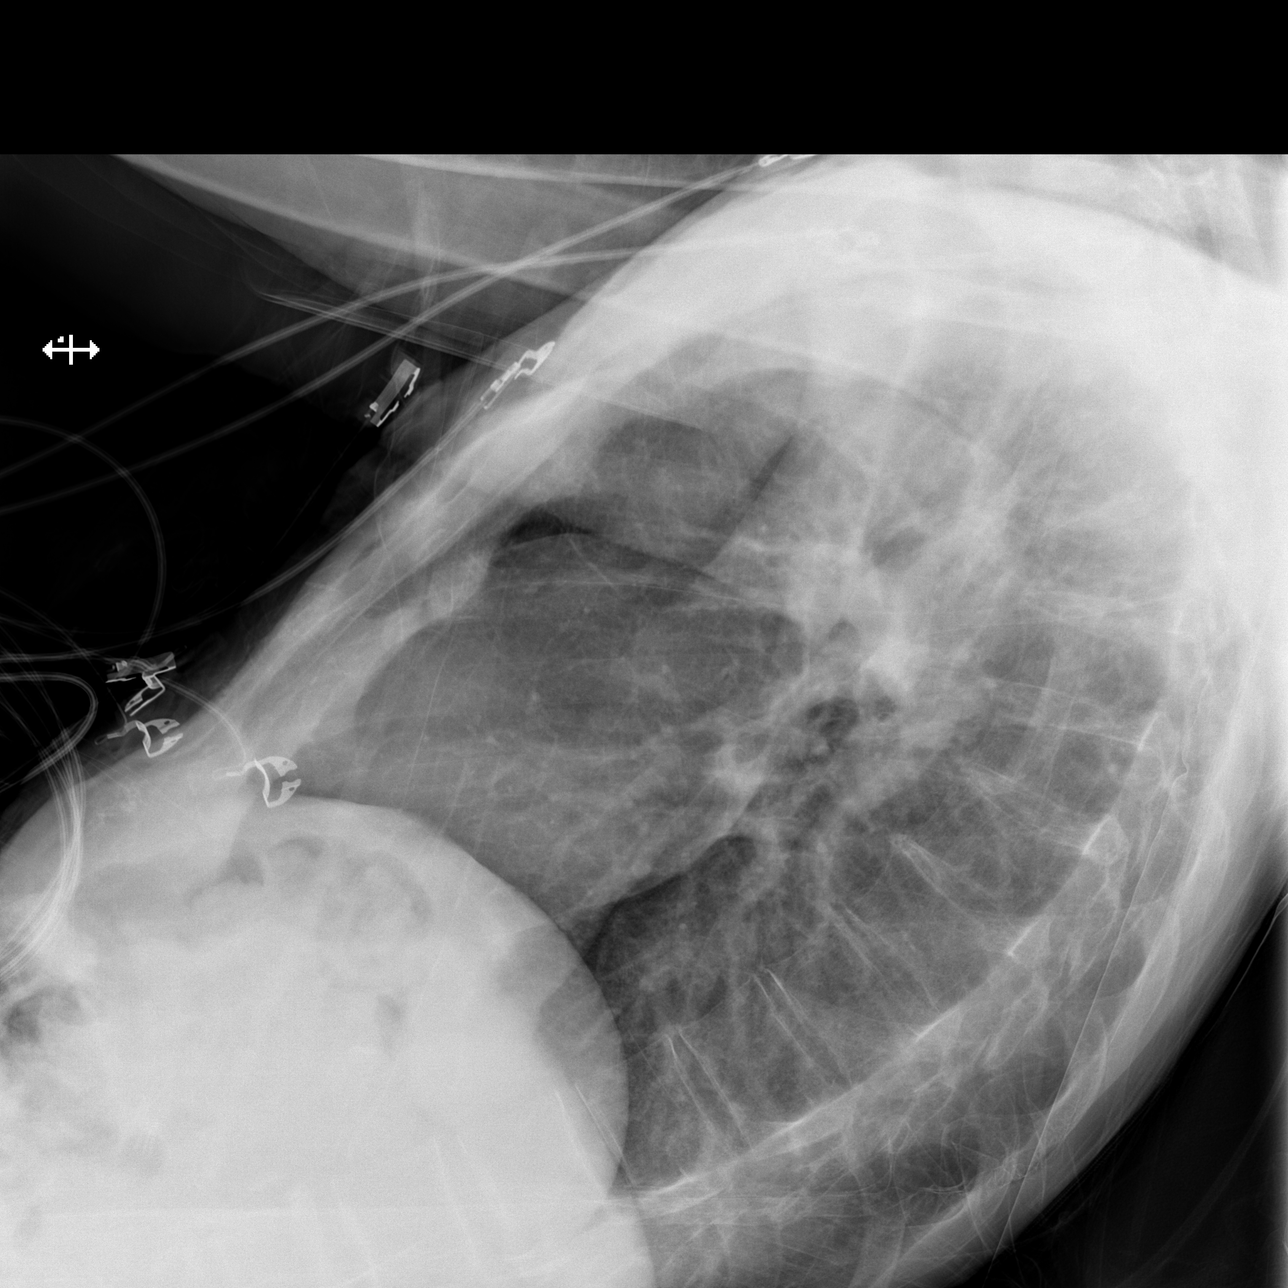

[w chest lat (2 of 2)]
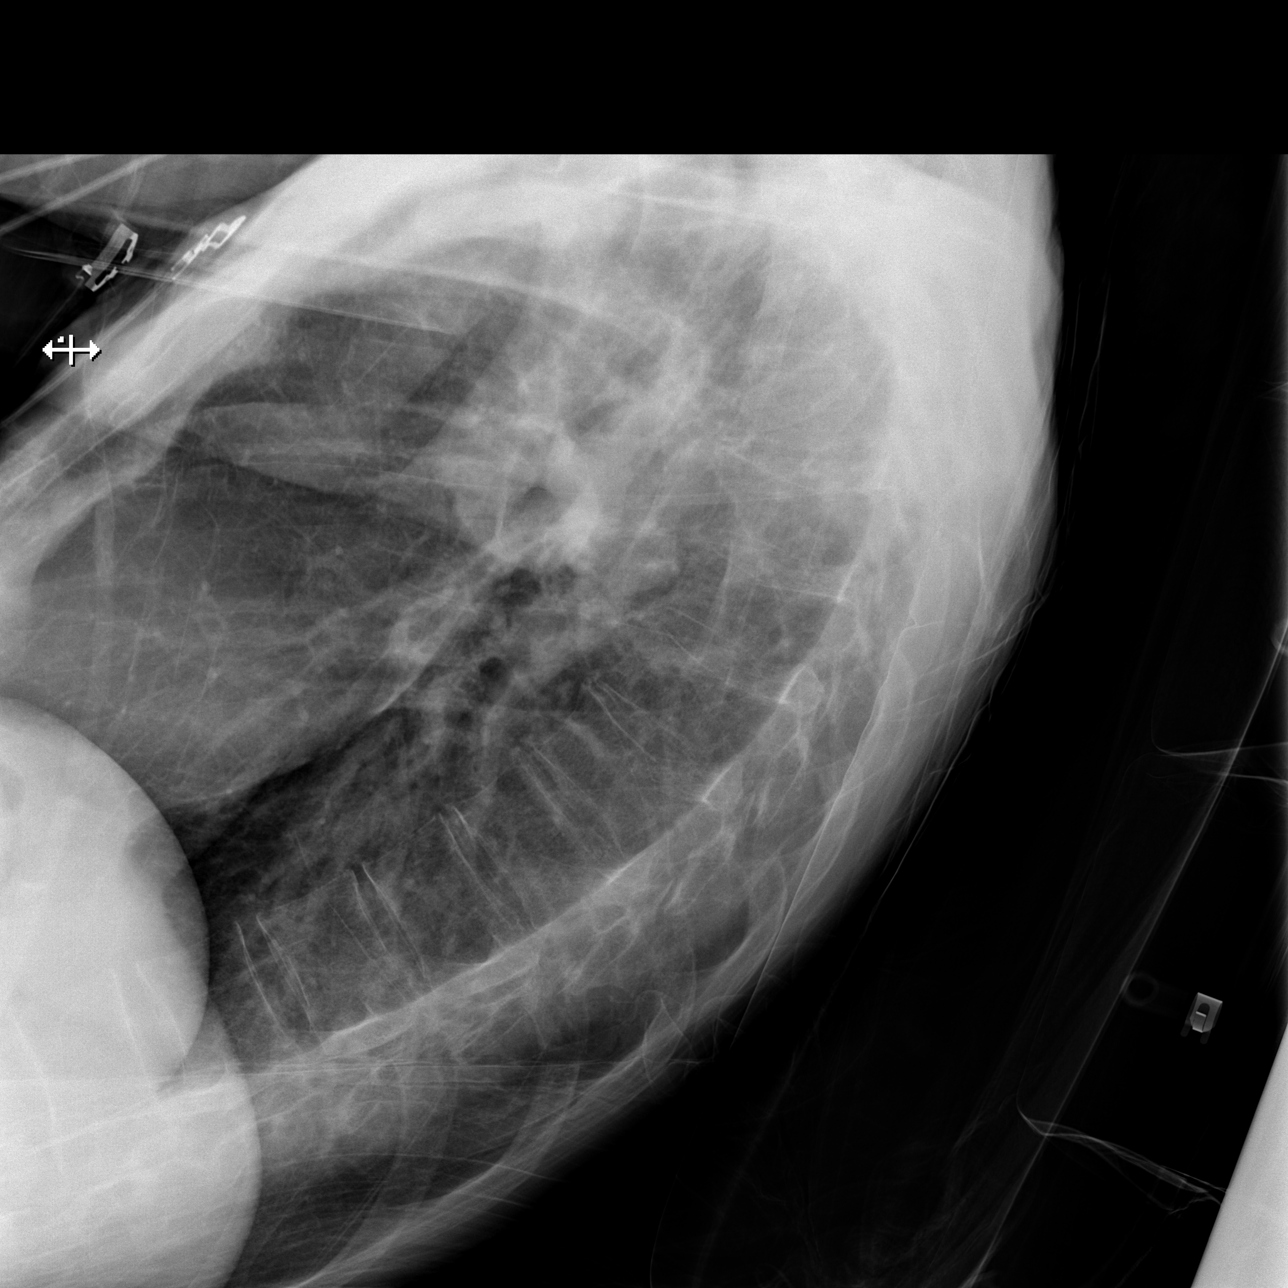

[x chest ap]
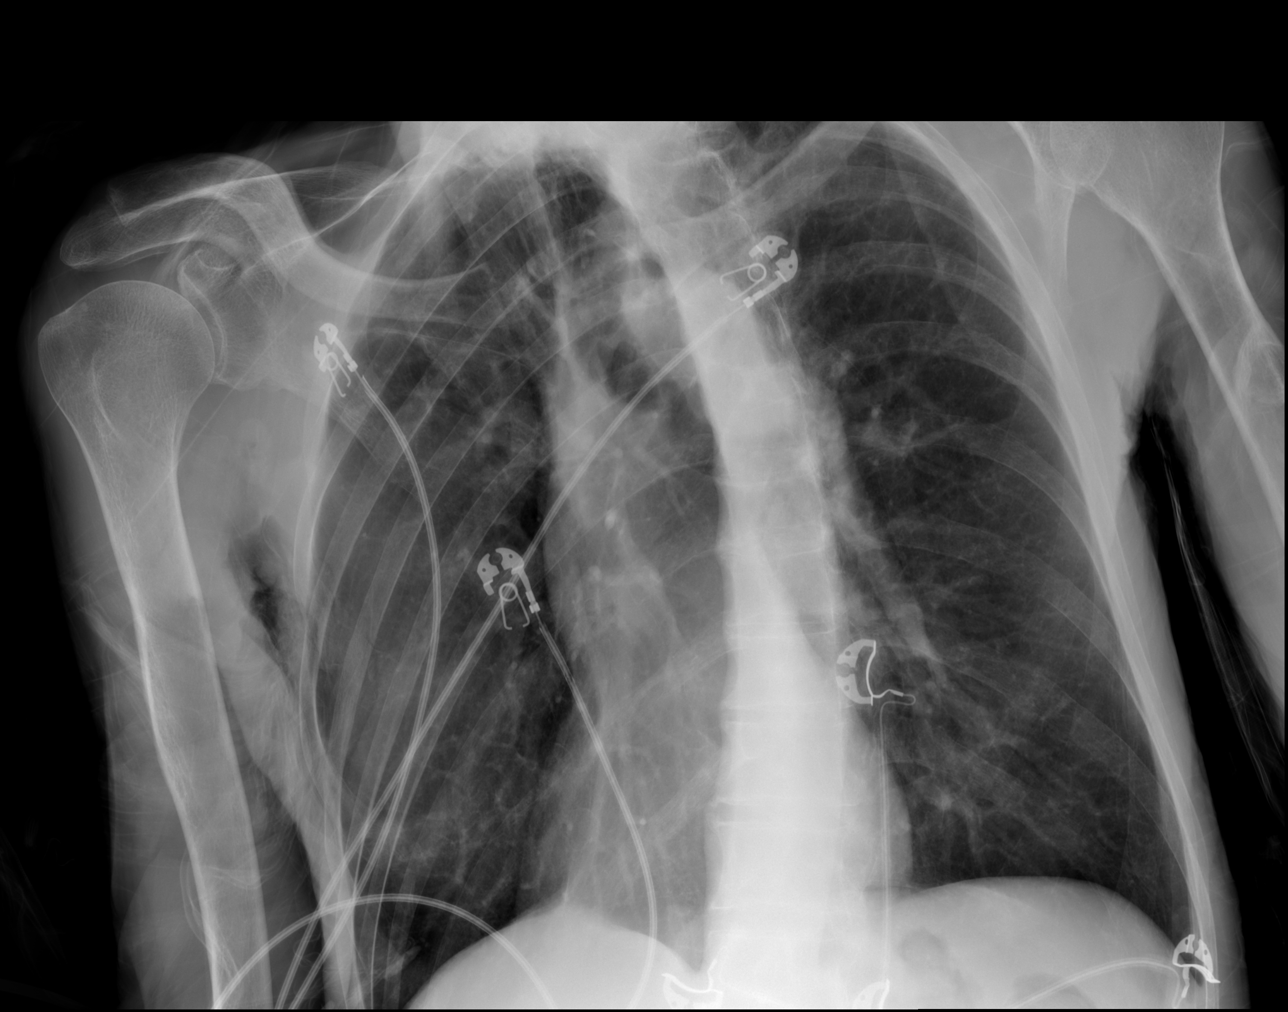

[3 of 3 positions shown; findings below may reference images not displayed]

FINDINGS: Study is limited due to patient positioning including significant
rotation. The heart, hila, and mediastinum are grossly unremarkable.
No focal infiltrate, nodule, or mass. No evidence of pneumothorax.
No other acute abnormalities.
IMPRESSION: The study is limited by positioning but no acute abnormality is
seen. A follow-up x-ray with better positioning could further
evaluate if concern persists.

## 2018-08-30 DIAGNOSIS — J0100 Acute maxillary sinusitis, unspecified: Secondary | ICD-10-CM | POA: Diagnosis not present

## 2018-08-30 DIAGNOSIS — J320 Chronic maxillary sinusitis: Secondary | ICD-10-CM | POA: Diagnosis not present

## 2018-09-03 DIAGNOSIS — G43909 Migraine, unspecified, not intractable, without status migrainosus: Secondary | ICD-10-CM | POA: Diagnosis not present

## 2018-09-03 DIAGNOSIS — G3184 Mild cognitive impairment, so stated: Secondary | ICD-10-CM | POA: Diagnosis not present

## 2018-09-03 DIAGNOSIS — F419 Anxiety disorder, unspecified: Secondary | ICD-10-CM | POA: Diagnosis not present

## 2018-09-03 DIAGNOSIS — I959 Hypotension, unspecified: Secondary | ICD-10-CM | POA: Diagnosis not present

## 2018-09-14 DIAGNOSIS — L718 Other rosacea: Secondary | ICD-10-CM | POA: Diagnosis not present

## 2018-09-14 DIAGNOSIS — H02883 Meibomian gland dysfunction of right eye, unspecified eyelid: Secondary | ICD-10-CM | POA: Diagnosis not present

## 2018-09-14 DIAGNOSIS — H1859 Other hereditary corneal dystrophies: Secondary | ICD-10-CM | POA: Diagnosis not present

## 2018-09-14 DIAGNOSIS — H43393 Other vitreous opacities, bilateral: Secondary | ICD-10-CM | POA: Diagnosis not present

## 2018-09-22 DIAGNOSIS — M65331 Trigger finger, right middle finger: Secondary | ICD-10-CM | POA: Diagnosis not present

## 2018-10-11 DIAGNOSIS — N401 Enlarged prostate with lower urinary tract symptoms: Secondary | ICD-10-CM | POA: Diagnosis not present

## 2018-10-11 DIAGNOSIS — R351 Nocturia: Secondary | ICD-10-CM | POA: Diagnosis not present

## 2018-10-22 DIAGNOSIS — R51 Headache: Secondary | ICD-10-CM | POA: Diagnosis not present

## 2018-11-02 DIAGNOSIS — H1859 Other hereditary corneal dystrophies: Secondary | ICD-10-CM | POA: Diagnosis not present

## 2018-11-02 DIAGNOSIS — I6523 Occlusion and stenosis of bilateral carotid arteries: Secondary | ICD-10-CM | POA: Diagnosis not present

## 2018-11-02 DIAGNOSIS — L718 Other rosacea: Secondary | ICD-10-CM | POA: Diagnosis not present

## 2018-11-02 DIAGNOSIS — H43393 Other vitreous opacities, bilateral: Secondary | ICD-10-CM | POA: Diagnosis not present

## 2018-11-08 ENCOUNTER — Other Ambulatory Visit (HOSPITAL_COMMUNITY): Payer: Self-pay | Admitting: Ophthalmology

## 2018-11-08 ENCOUNTER — Ambulatory Visit (HOSPITAL_COMMUNITY)
Admission: RE | Admit: 2018-11-08 | Discharge: 2018-11-08 | Disposition: A | Discharge status: Home / Self Care | Payer: BLUE CROSS/BLUE SHIELD | Source: Ambulatory Visit | Attending: Cardiology | Admitting: Cardiology

## 2018-11-08 ENCOUNTER — Other Ambulatory Visit: Payer: Self-pay

## 2018-11-08 DIAGNOSIS — I6523 Occlusion and stenosis of bilateral carotid arteries: Secondary | ICD-10-CM

## 2018-11-23 DIAGNOSIS — Z86008 Personal history of in-situ neoplasm of other site: Secondary | ICD-10-CM | POA: Diagnosis not present

## 2018-11-23 DIAGNOSIS — D1801 Hemangioma of skin and subcutaneous tissue: Secondary | ICD-10-CM | POA: Diagnosis not present

## 2018-11-23 DIAGNOSIS — Z1283 Encounter for screening for malignant neoplasm of skin: Secondary | ICD-10-CM | POA: Diagnosis not present

## 2018-11-23 DIAGNOSIS — L570 Actinic keratosis: Secondary | ICD-10-CM | POA: Diagnosis not present

## 2018-11-23 DIAGNOSIS — L821 Other seborrheic keratosis: Secondary | ICD-10-CM | POA: Diagnosis not present

## 2018-11-23 DIAGNOSIS — Z86007 Personal history of in-situ neoplasm of skin: Secondary | ICD-10-CM | POA: Diagnosis not present

## 2018-11-23 DIAGNOSIS — I781 Nevus, non-neoplastic: Secondary | ICD-10-CM | POA: Diagnosis not present

## 2018-12-08 DIAGNOSIS — J3489 Other specified disorders of nose and nasal sinuses: Secondary | ICD-10-CM | POA: Diagnosis not present

## 2018-12-08 DIAGNOSIS — K046 Periapical abscess with sinus: Secondary | ICD-10-CM | POA: Diagnosis not present

## 2018-12-08 DIAGNOSIS — J310 Chronic rhinitis: Secondary | ICD-10-CM | POA: Diagnosis not present

## 2018-12-17 DIAGNOSIS — H43393 Other vitreous opacities, bilateral: Secondary | ICD-10-CM | POA: Diagnosis not present

## 2018-12-17 DIAGNOSIS — I6523 Occlusion and stenosis of bilateral carotid arteries: Secondary | ICD-10-CM | POA: Diagnosis not present

## 2018-12-31 DIAGNOSIS — H5213 Myopia, bilateral: Secondary | ICD-10-CM | POA: Diagnosis not present

## 2019-03-11 DIAGNOSIS — R0602 Shortness of breath: Secondary | ICD-10-CM | POA: Diagnosis not present

## 2019-04-26 ENCOUNTER — Ambulatory Visit: Payer: BLUE CROSS/BLUE SHIELD

## 2019-05-03 ENCOUNTER — Other Ambulatory Visit: Payer: Self-pay

## 2019-05-03 ENCOUNTER — Ambulatory Visit (INDEPENDENT_AMBULATORY_CARE_PROVIDER_SITE_OTHER): Payer: Medicare Other | Admitting: Diagnostic Neuroimaging

## 2019-05-03 ENCOUNTER — Encounter: Payer: Self-pay | Admitting: Diagnostic Neuroimaging

## 2019-05-03 VITALS — BP 100/59 | HR 59 | Temp 97.1°F | Ht 69.0 in | Wt 119.2 lb

## 2019-05-03 DIAGNOSIS — G3184 Mild cognitive impairment, so stated: Secondary | ICD-10-CM

## 2019-05-03 DIAGNOSIS — R269 Unspecified abnormalities of gait and mobility: Secondary | ICD-10-CM | POA: Diagnosis not present

## 2019-05-03 DIAGNOSIS — F419 Anxiety disorder, unspecified: Secondary | ICD-10-CM | POA: Diagnosis not present

## 2019-05-03 NOTE — Progress Notes (Signed)
[name]  PATIENT: Lee Nelson DOB: 26-Mar-1941  REFERRING CLINICIAN: Avva HISTORY FROM: patient  REASON FOR VISIT: follow up   HISTORICAL  CHIEF COMPLAINT:  Chief Complaint  Patient presents with  . Balance/coordination concerns    rm 7  "balance is off, falling into walls"  . Memory Loss    MMSE 30    HISTORY OF PRESENT ILLNESS:   UPDATE (05/03/19, VRP): Since last visit, doing slightly worse, more memory loss, stress, gait difficulty. No alleviating or aggravating factors. More stress related to wife's health, cargiving expenses, having to retire from Teacher, adult educationteaching law.     UPDATE (06/01/18, VRP): Since last visit, noting more memory issues. Has missed a meeting. Some words are jumbled. Symptoms are progressive. Insomnia and anxiety have worsened.   UPDATE (11/09/17, VRP): Since last visit, doing about the same. Symptoms are stable. Severity is mild. No alleviating or aggravating factors. Tolerating supplements. PT exercises helped, but now recovering from hernia 10/16/17. Also had transient right eye vision changes in May 2019 (shade over the right eye) x 1 minute. No recurrence.  UPDATE (05/07/17, VRP): Since last visit, doing about the same. Tolerating meds. No alleviating or aggravating factors. Has had more falls and balance issues. More memory loss issues.   UPDATE 12/30/16: Since last visit, continues with word finding diff. Also with intermittent right sided headaches, intermittent double vision, and neck pain. Avg 1-2 days per week of headache.   UPDATE 03/10/14: Since last visit, no more double vision attacks. Notes some work finding diff. Still with stress from his wife's quadriplegia and 24 hour care at home.   PRIOR HPI (09/07/13): 79 year old right-handed male with history of neck pain, back pain, skin cancer, reflux disease, osteoporosis, anxiety, fibromyalgia, migraine, here for evaluation of headaches and transient double vision. Previous evaluated  patient in 2012 for right arm numbness. Patient reports over ten-year history of intermittent neck pain. Patient has some degenerative cervical spine disease managed conservatively. Patient has one to 2 year history of intermittent headaches mainly in the right parietal and right occipital region. Some pain radiates from the neck up to the head. No nausea or vomiting. No photophobia or phonophobia. Sometimes he has sharp shooting brief pains. He rates pain severity 2-3/10. More recently headache symptoms have been more constant. No visual symptoms associated with headache. Separately patient has intermittent visual disturbance where he sees "swimming lines" in front of him, but these are not associated with headache. 10 days ago patient was at a meeting talking to some people, when he had sudden onset of vertical double vision lasting for one to 2 minutes. He had mild queasiness sensation. No vomiting. He had mild equilibrium problem without falling out of his chair. No slurred speech. No numbness or tingling. Symptoms resolved on their own. No headaches associated with this.  REVIEW OF SYSTEMS: Full 14 system review of systems performed and negative except: as per HPI.  ALLERGIES: Allergies  Allergen Reactions  . Ciprofloxacin     JOINT PAIN  . Flagyl [Metronidazole] Other (See Comments)    REACTION: no appetite, diarrhea after meal, decrease in weight  . Metoclopramide Hcl Other (See Comments)    REACTION: "involuntary movements"  . Septra [Sulfamethoxazole-Trimethoprim] Other (See Comments)    REACTION: "involuntary movements" tripac antibiotic- heart rythm  . Silodosin     ? Possibly allergy, could not breathe well out of nose    HOME MEDICATIONS: Outpatient Medications Prior to Visit  Medication Sig Dispense Refill  .  ALPRAZolam (XANAX) 0.25 MG tablet Take 0.125 mg by mouth 2 (two) times daily as needed for sleep.     Marland Kitchen b complex vitamins capsule Take 1 capsule by mouth daily.    .  Cholecalciferol (VITAMIN D-3) 1000 units CAPS Take 2,000 Units by mouth daily.     . Cranberry 400 MG CAPS Take 400-1,200 mg by mouth every morning.     Marland Kitchen GRAPE SEED ER PO Take by mouth.    Marland Kitchen HYDROcodone-acetaminophen (NORCO/VICODIN) 5-325 MG tablet 1 po q d prn pain 30 tablet 0  . L-Tyrosine 500 MG CAPS Take by mouth daily.    Marland Kitchen MAGNESIUM CITRATE PO Take 75 mg by mouth daily.    . Melatonin 2.5 MG CAPS Take 1.25 mg by mouth at bedtime. As needed    . Menaquinone-7 (VITAMIN K2 PO) Take 120-240 mg by mouth daily. Combo w/ Vit. D    . Milk Thistle 175 MG CAPS Take 1 capsule by mouth daily.     . Multiple Vitamin (MULTIVITAMIN) capsule Take 1 capsule by mouth every morning.     Marland Kitchen OVER THE COUNTER MEDICATION Take 3 capsules by mouth 2 (two) times daily. Lion's mane 0.5 gram/capsule    . OVER THE COUNTER MEDICATION Take 2 capsules by mouth every morning. Reparagen supplement    . OVER THE COUNTER MEDICATION Take 1 tablet by mouth daily as needed (for eye migraine). Mygra Few    . OVER THE COUNTER MEDICATION CocoaVia flavonoids    . OVER THE COUNTER MEDICATION Sinus Clear Herb PO-- takes as needed    . Phosphatidylserine 100 MG CAPS Take 100 mg by mouth every morning.     . Probiotic Product (PROBIOTIC DAILY PO) Take by mouth.    . sodium chloride (MURO 128) 5 % ophthalmic ointment Place 1 application into both eyes 2 (two) times daily.     . THEANINE PO Take 2 tablets by mouth 2 (two) times daily.     . Turmeric, Curcuma Longa, (CURCUMIN) POWD 220 mg by Does not apply route.    Marland Kitchen UNABLE TO FIND Med Name: suntheanine 125 mg 2x3/day    . UNABLE TO FIND Med Name: Acetyl-L-Carnitine    . vitamin C (ASCORBIC ACID) 250 MG tablet Take 250 mg by mouth daily.    . Ginseng 100 MG CAPS Take by mouth.    . loratadine (CLARITIN) 10 MG tablet Take 10 mg by mouth every morning.    Marland Kitchen OVER THE COUNTER MEDICATION Aloe, Mucilaginous, Polysaccheride, L-glutinine--- takes x1 50mg  each twice daily for constipation     . OVER THE COUNTER MEDICATION Tranquil Sleep (5HDP, melatonin)  Takes bedtime as needed     No facility-administered medications prior to visit.    PAST MEDICAL HISTORY: Past Medical History:  Diagnosis Date  . Anxiety   . Arthritis   . Bladder calculus   . BPH (benign prostatic hyperplasia)   . Chronic constipation   . Complication of anesthesia    " I had some coughing afterwards for a couple of days"--  per pt "perfers spinal anesthesia since general anesthesia congnitive issues when older"  . Diverticulosis of colon   . Dry eye syndrome of both eyes   . Environmental allergies   . GERD (gastroesophageal reflux disease)    occasional  . History of adenomatous polyp of colon    08/ 2004  . History of kidney stones   . History of squamous cell carcinoma in situ (SCCIS) of skin  s/p  excision 2013 facial areas and 06/ 2016 nose  . Migraine    eye migraine occasional  . Seasonal and perennial allergic rhinitis   . Thrombocytopenia (HCC)   . Tingling    hands and feet bilat , intermittantly-- per pt has lumbar bulging disk  . Urinary frequency   . Vocal fold atrophy    dysphonia-- per pt has to drink large amount of water to take even on pill  . Wears glasses     PAST SURGICAL HISTORY: Past Surgical History:  Procedure Laterality Date  . APPENDECTOMY  child  . CARDIOVASCULAR STRESS TEST  05-17-2015  dr hilty   Low risk nuclear study w/ no ischemia/  normal LV function and wall motion , stress ef 54% (lvef 45-54%)  . CHOLECYSTECTOMY N/A 09/29/2014   Procedure: LAPAROSCOPIC CHOLECYSTECTOMY WITH INTRAOPERATIVE CHOLANGIOGRAM;  Surgeon: Ovidio Kin, MD;  Location: [facility];  Service: General;  Laterality: N/A;  . COLONOSCOPY  last one 09-06-2010  . ESOPHAGOGASTRODUODENOSCOPY N/A 12/09/2013   Procedure: ESOPHAGOGASTRODUODENOSCOPY (EGD);  Surgeon: Willis Modena, MD;  Location: [facility] ENDOSCOPY;  Service: Endoscopy;  Laterality: N/A;  . EXTRACORPOREAL SHOCK WAVE LITHOTRIPSY  yrs  ago  . INGUINAL HERNIA REPAIR Left child  . inguinal hernia repair  09/2017  . NISSEN FUNDOPLICATION  1980's   open  . STONE EXTRACTION WITH BASKET N/A 08/18/2016   Procedure: STONE EXTRACTION WITH BASKET;  Surgeon: Jethro Bolus, MD;  Location: [facility];  Service: Urology;  Laterality: N/A;  . THULIUM LASER TURP (TRANSURETHRAL RESECTION OF PROSTATE) N/A 08/18/2016   Procedure: THULIUM LASER BLADDER NECK INCISION AND BLADDER STONE REMOVAL;  Surgeon: Jethro Bolus, MD;  Location: [facility];  Service: Urology;  Laterality: N/A;  . TONSILLECTOMY  child  . TRANSTHORACIC ECHOCARDIOGRAM  11/18/2010   grade 1 diastolic dysfunction, ef 55-60%/  trivial MR and TR/ mild dilated RA    FAMILY HISTORY: Family History  Problem Relation Age of Onset  . Parkinsonism Brother   . COPD Mother   . Allergies Mother   . Heart failure Mother   . COPD Father   . Stroke Father   . Other Sister   . Suicidality Maternal Aunt   . Cancer Maternal Grandfather     SOCIAL HISTORY:  Social History   Socioeconomic History  . Marital status: Married    Spouse name: Gavin Pound  . Number of children: 1  . Years of education: [place], [place], [place]  . Highest education level: Not on file  Occupational History  . Occupation: teaches constitutional Social worker  . 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 Smoking status: Never Smoker  . Smokeless tobacco: Never Used  Substance and Sexual Activity  . Alcohol use: Yes    Comment: social  . Drug use: No  . Sexual activity: Not on file  Other Topics Concern  . Not on file  Social History Narrative   Patient lives at home wife.   Daily caffeine use   Social Determinants of Health   Financial Resource Strain:   . Difficulty of Paying Living Expenses: Not on file  Food Insecurity:   . Worried About Programme researcher, broadcasting/film/video in the Last Year: Not on file  . Ran Out of Food in the Last Year: Not on file   Transportation Needs:   . Lack of Transportation (Medical): Not on file  . Lack of Transportation (Non-Medical): Not on file  Physical Activity:   .  Days of Exercise per Week: Not on file  . Minutes of Exercise per Session: Not on file  Stress:   . Feeling of Stress : Not on file  Social Connections:   . Frequency of Communication with Friends and Family: Not on file  . Frequency of Social Gatherings with Friends and Family: Not on file  . Attends Religious Services: Not on file  . Active Member of Clubs or Organizations: Not on file  . Attends Club or Organization Meetings: Not on file  . Marital Status: Not on file  Intimate Partner Violence:   . Fear of Current or Ex-Partner: Not on Bankerfile  . Emotionally Abused: Not on file  . Physically Abused: Not on file  . Sexually Abused: Not on file     PHYSICAL EXAM  GENERAL EXAM/CONSTITUTIONAL: Vitals:  Vitals:   05/03/19 0841  BP: (!) 100/59  Pulse: (!) 59  Temp: (!) 97.1 F (36.2 C)  Weight: 119 lb 3.2 oz (54.1 kg)  Height: 5\' 9"  (1.753 m)   Body mass index is 17.6 kg/m. Wt Readings from Last 3 Encounters:  05/03/19 119 lb 3.2 oz (54.1 kg)  06/01/18 118 lb 9.6 oz (53.8 kg)  11/09/17 120 lb 3.2 oz (54.5 kg)    Patient is in no distress; well developed, nourished and groomed; neck is supple  CARDIOVASCULAR:  Examination of carotid arteries is normal; no carotid bruits  Regular rate and rhythm, no murmurs  Examination of peripheral vascular system by observation and palpation is normal  EYES:  Ophthalmoscopic exam of optic discs and posterior segments is normal; no papilledema or hemorrhages No exam data present  MUSCULOSKELETAL:  Gait, strength, tone, movements noted in Neurologic exam below  NEUROLOGIC: MENTAL STATUS:  MMSE - Mini Mental State Exam 05/03/2019 06/01/2018 11/09/2017  Orientation to time 5 5 5   Orientation to Place 5 5 4   Registration 3 3 3   Attention/ Calculation 5 5 4   Recall 3 3 3    Language- name 2 objects 2 2 2   Language- repeat 1 1 1   Language- follow 3 step command 3 3 3   Language- read & follow direction 1 1 1   Write a sentence 1 1 1   Copy design 1 0 1  Total score 30 29 28     awake, alert, oriented to person, place and time  recent and remote memory intact  normal attention and concentration  language fluent, comprehension intact, naming intact  fund of knowledge appropriate  CRANIAL NERVE:   2nd - no papilledema on fundoscopic exam  2nd, 3rd, 4th, 6th - pupils equal and reactive to light, visual fields full to confrontation, extraocular muscles intact, no nystagmus  5th - facial sensation symmetric  7th - facial strength symmetric  8th - hearing intact  9th - palate elevates symmetrically, uvula midline  11th - shoulder shrug symmetric  12th - tongue protrusion midline  MOTOR:   normal bulk and tone, full strength in the BUE, BLE  MILD BRADYKINESIA IN BUE / BLE  SENSORY:   normal and symmetric to light touch, temperature, vibration  COORDINATION:   finger-nose-finger, fine finger movements normal  REFLEXES:   deep tendon reflexes --> BUE 1-2; BLE 2-3; symmetric  GAIT/STATION:   narrow based gait; STOOPED POSTURE; SLIGHT STAGGERING GAIT       DIAGNOSTIC DATA (LABS, IMAGING, TESTING) - I reviewed patient records, labs, notes, testing and imaging myself where available.  Lab Results  Component Value Date   WBC 5.4 04/19/2017  HGB 14.0 04/19/2017   HCT 39.7 04/19/2017   MCV 95.0 04/19/2017   PLT 100 (L) 04/19/2017      Component Value Date/Time   NA 136 04/19/2017 1606   K 3.9 04/19/2017 1606   CL 104 04/19/2017 1606   CO2 27 04/19/2017 1606   GLUCOSE 119 (H) 04/19/2017 1606   BUN 20 04/19/2017 1606   CREATININE 0.87 04/19/2017 1606   CALCIUM 8.9 04/19/2017 1606   PROT 6.3 (L) 11/20/2014 2140   ALBUMIN 3.9 11/20/2014 2140   AST 23 11/20/2014 2140   ALT 16 (L) 11/20/2014 2140   ALKPHOS 82 11/20/2014  2140   BILITOT 0.4 11/20/2014 2140   GFRNONAA >60 04/19/2017 1606   GFRAA >60 04/19/2017 1606   No results found for: CHOL No results found for: HGBA1C Lab Results  Component Value Date   VITAMINB12 711 05/08/2017   No results found for: TSH  07/20/10 MRI CERVICAL 1. Left C6-C7 paracentral disc protrusion abutting the exiting left C7 nerve roots appears new. Preexisting mild left C7 foraminal stenosis at this level due to facet hypertrophy.  2. Mild combined congenital and acquired spinal stenosis at the C3-C4 and C4-C5.  11/20/10 MRI brain (without contrast) demonstrating: 1. Moderate ventriculomegaly in the temporal and occipital horns.  Likely due to subcortical atrophy. 2. No acute findings are seen.  11/20/10 MRA head (without contrast) demonstrating: - mild atheromatous irregularities in the bilateral carotid siphon regions.  11/19/10 EMG/NCS - normal  11/19/10 carotid u/s - normal  11/18/10 TTE - EF 55-60%; normal wall motion  09/19/13 MRI brain (with and without) demonstrating: 1. Moderate mesial temporal atrophy. 2. Moderate ventriculomegaly on ex vacuo basis. 3. No acute findings.  11/11/16 MRI brain [I reviewed images myself and agree with interpretation. -VRP]  1. No acute intracranial process. 2. Progressed nonspecific moderate to severe parenchymal brain volume loss for age. No hydrocephalus.  02/02/17 MRI cervical spine [I reviewed images myself and agree with interpretation. -VRP]  1. At C3-C4, there is borderline spinal stenosis due to ligamentum flavum hypertrophy and congenitally short pedicles.   There is no nerve root compression. 2. At C4-C5, there is mild spinal stenosis due to disc bulging, ligament of flavum hypertrophy, facet hypertrophy, mild uncovertebral spurring and congenitally short pedicles. There is no nerve root compression. Degenerative changes at this level have slightly progressed compared to the 07/20/2010 MRI. 3. At C5-C6, there are  degenerative changes no nerve root compression or spinal stenosis 4. At C6-C7, there are degenerative changes causing mild foraminal narrowing but no nerve root compression.   On the previous MRI there was a disc protrusion to the left that is not appreciated on the current study. 5. The spinal cord has normal signal.  08/11/17 CAROTID U/S Right Carotid: There was no evidence of thrombus, dissection, atherosclerotic plaque or stenosis in the cervical carotid system.  Left Carotid: Velocities in the left ICA are consistent with a 1-39% stenosis.  Vertebrals: Bilateral vertebral arteries demonstrate antegrade flow.  Subclavians: Normal flow hemodynamics were seen in bilateral subclavian arteries.    ASSESSMENT AND PLAN  79 y.o. year old male here with chronic neck pain x 10 years, int HA x 1-2 years, with a 2 minute episode of vertical double vision in June 2015, with no recurrence.   Also with gait diff and memory loss and weight loss and gait difficulty.  Dx:  MCI (mild cognitive impairment) - Plan: Ambulatory referral to Psychology  Gait difficulty  Anxiety - Plan: Ambulatory  referral to Psychology    PLAN:  WORD FINDING DIFFICULTY / MCI - stable; continue good nutrition, exercises, brain stimulating activities - improve nutrition, exercise, mood stabilization and insomnia - refer to psychology for anxiety / insomnia  GAIT DIFFICULTY / HYPERREFLEXIA / SPINAL STENOSIS - stable; continue exercises; use cane / walker if needed - consider PT evaluation  TIA (right eye amaurosis fugax; May 2019) - advised to take aspirin 81mg  daily, but caution with history of low platelets and current curcumin use  MIGRAINE WITH AURA - improved; monitor for now (did not tolerated amitriptyline)  Return for return to PCP, pending if symptoms worsen or fail to improve.    , MD 05/03/2019, 9:20 AM Certified in Neurology, Neurophysiology and Neuroimaging  [facility] [address], [address] [place], [place] [phone]

## 2019-05-05 ENCOUNTER — Ambulatory Visit: Payer: BLUE CROSS/BLUE SHIELD | Attending: Internal Medicine

## 2019-05-05 DIAGNOSIS — Z23 Encounter for immunization: Secondary | ICD-10-CM | POA: Insufficient documentation

## 2019-05-05 NOTE — Progress Notes (Signed)
   Covid-19 Vaccination Clinic  Name:  Lee Nelson    MRN: [id number] DOB: Feb 14, 1941  05/05/2019  Mr. [name] was observed post Covid-19 immunization for 15 minutes without incidence. He was provided with Vaccine Information Sheet and instruction to access the V-Safe system.   Mr. [name] was instructed to call 911 with any severe reactions post vaccine: Marland Kitchen Difficulty breathing  . Swelling of your face and throat  . A fast heartbeat  . A bad rash all over your body  . Dizziness and weakness    Immunizations Administered    Name Date Dose VIS Date Route   Pfizer COVID-19 Vaccine 05/05/2019  9:34 AM 0.3 mL 03/11/2019 Intramuscular   Manufacturer: [employer], [employer]   Lot: [id number]   NDC: [address]-8

## 2019-05-07 ENCOUNTER — Ambulatory Visit: Payer: BLUE CROSS/BLUE SHIELD

## 2019-05-30 ENCOUNTER — Ambulatory Visit: Payer: BLUE CROSS/BLUE SHIELD | Attending: Internal Medicine

## 2019-05-30 DIAGNOSIS — Z23 Encounter for immunization: Secondary | ICD-10-CM

## 2019-05-30 NOTE — Progress Notes (Signed)
   Covid-19 Vaccination Clinic  Name:  [name]    MRN: [id number] DOB: 05-Sep-1940  05/30/2019  [name] was observed post Covid-19 immunization for 15 minutes without incidence. He was provided with Vaccine Information Sheet and instruction to access the V-Safe system.   [name] was instructed to call 911 with any severe reactions post vaccine: Marland Kitchen Difficulty breathing  . Swelling of your face and throat  . A fast heartbeat  . A bad rash all over your body  . Dizziness and weakness    Immunizations Administered    Name Date Dose VIS Date Route   Pfizer COVID-19 Vaccine 05/30/2019  2:20 PM 0.3 mL 03/11/2019 Intramuscular   Manufacturer: [employer], [employer]   Lot: [id number]   NDC: [address]-6

## 2019-06-01 ENCOUNTER — Ambulatory Visit (INDEPENDENT_AMBULATORY_CARE_PROVIDER_SITE_OTHER): Payer: Medicare Other | Admitting: Orthopedic Surgery

## 2019-06-01 ENCOUNTER — Other Ambulatory Visit: Payer: Self-pay

## 2019-06-01 DIAGNOSIS — M65341 Trigger finger, right ring finger: Secondary | ICD-10-CM | POA: Diagnosis not present

## 2019-06-01 DIAGNOSIS — M25531 Pain in right wrist: Secondary | ICD-10-CM | POA: Diagnosis not present

## 2019-06-01 DIAGNOSIS — M65331 Trigger finger, right middle finger: Secondary | ICD-10-CM | POA: Diagnosis not present

## 2019-06-03 ENCOUNTER — Encounter: Payer: Self-pay | Admitting: Orthopedic Surgery

## 2019-06-03 NOTE — Progress Notes (Signed)
Office Visit Note   Patient: Lee Nelson           Date of Birth: 03/23/1941           MRN: [id number] Visit Date: 06/01/2019 Requested by: Chilton Greathouse, MD [address] [place],  [place] [address] PCP: Chilton Greathouse, MD  Subjective: Chief Complaint  Patient presents with  . Right Hand - Pain    HPI: Lee Nelson is a 79 y.o. male who presents to the office complaining of right hand pain.  Patient notes right hand pain that he localizes to the base of the third and fourth fingers.  He denies any injury leading to the onset of pain.  He notes multiple months worth of pain.  He is seeing a hand specialist who reportedly injected the A1 pulley of the right middle finger.  This provided some relief but has not resolved his pain.  He notes pain is worse with turning a doorknob and pulling doors open.  Denies any significant dysfunction but does admit to triggering of the third and fourth digits.  He does have to manually reduce these fingers at times.  He sleeps in a glove which prevents his fingers from triggering and helps with overall pain relief..    Patient also notes that his wrist slipped as he got out from his chair about a month ago.  This has caused him some discomfort in the right wrist with daily activities but overall does not bother him too much.  He has no significant activity restriction.  No loss of range of motion reportedly.              ROS:  All systems reviewed are negative as they relate to the chief complaint within the history of present illness.  Patient denies fevers or chills.  Assessment & Plan: Visit Diagnoses:  1. Trigger finger, right middle finger   2. Trigger finger, right ring finger   3. Pain in right wrist     Plan: Patient is a 79 year old male who presents complaining of right hand and wrist pain.  Regarding the right hand pain, impression is trigger finger of the right middle and ring fingers.  Patient has had a prior injection but he  does not want another one.  Recommended patient try using Voltaren gel for pain relief.  Discussed that his trigger finger may progress to the point that it is unable to be reduced manually, and this would require surgery.  Patient understands.  As for the right wrist pain, we discussed getting x-rays today in the clinic.  He would like to avoid the radiation of the x-rays if possible.  this is reasonable as it is not causing him significant daily pain.  We agreed that if pain does not improve in the next coming weeks he will return to the office for x-ray check and further evaluation.  Patient agreed with plan will follow up with the office as needed.  Follow-Up Instructions: No follow-ups on file.   Orders:  No orders of the defined types were placed in this encounter.  No orders of the defined types were placed in this encounter.     Procedures: No procedures performed   Clinical Data: No additional findings.  Objective: Vital Signs: There were no vitals taken for this visit.  Physical Exam:  Constitutional: Patient appears well-developed HEENT:  Head: Normocephalic Eyes:EOM are normal Neck: Normal range of motion Cardiovascular: Normal rate Pulmonary/chest: Effort normal Neurologic: Patient is alert  Skin: Skin is warm Psychiatric: Patient has normal mood and affect  Ortho Exam:  Right hand/wrist exam No triggering elicited with passive range of motion of the right hand.  Tender to palpation over the third and fourth A1 pulleys of the right hand.  Pain is worse with heavy grip.  No loss of wrist range of motion compared with the contralateral wrist in regards to wrist flexion, extension, pronation, supination.  No significant area of increased tenderness throughout the right wrist.  Specialty Comments:  No specialty comments available.  Imaging: No results found.   PMFS History: Patient Active Problem List   Diagnosis Date Noted  . Amaurosis fugax of right eye  08/25/2017  . PSVT (paroxysmal supraventricular tachycardia) ([facility]) 09/12/2015  . Chest pain 05/03/2015  . Dyspnea 05/03/2015  . ALLERGIC RHINITIS 09/21/2007  . G E R D 09/21/2007  . SMOKE INHALATION 09/21/2007  . OSTEOPOROSIS 01/28/2007  . PALPITATIONS 01/28/2007  . COUGH 01/28/2007  . ALLERGY 01/28/2007   Past Medical History:  Diagnosis Date  . Anxiety   . Arthritis   . Bladder calculus   . BPH (benign prostatic hyperplasia)   . Chronic constipation   . Complication of anesthesia    " I had some coughing afterwards for a couple of days"--  per pt "perfers spinal anesthesia since general anesthesia congnitive issues when older"  . Diverticulosis of colon   . Dry eye syndrome of both eyes   . Environmental allergies   . GERD (gastroesophageal reflux disease)    occasional  . History of adenomatous polyp of colon    08/ 2004  . History of kidney stones   . History of squamous cell carcinoma in situ (SCCIS) of skin    s/p  excision 2013 facial areas and 06/ 2016 nose  . Migraine    eye migraine occasional  . Seasonal and perennial allergic rhinitis   . Thrombocytopenia ([facility])   . Tingling    hands and feet bilat , intermittantly-- per pt has lumbar bulging disk  . Urinary frequency   . Vocal fold atrophy    dysphonia-- per pt has to drink large amount of water to take even on pill  . Wears glasses     Family History  Problem Relation Age of Onset  . Parkinsonism Brother   . COPD Mother   . Allergies Mother   . Heart failure Mother   . COPD Father   . Stroke Father   . Other Sister   . Suicidality Maternal Aunt   . Cancer Maternal Grandfather     Past Surgical History:  Procedure Laterality Date  . APPENDECTOMY  child  . CARDIOVASCULAR STRESS TEST  05-17-2015  dr hilty   Low risk nuclear study w/ no ischemia/  normal LV function and wall motion , stress ef 54% (lvef 45-54%)  . CHOLECYSTECTOMY N/A 09/29/2014   Procedure: LAPAROSCOPIC CHOLECYSTECTOMY WITH  INTRAOPERATIVE CHOLANGIOGRAM;  Surgeon: Alphonsa Overall, MD;  Location: [facility];  Service: General;  Laterality: N/A;  . COLONOSCOPY  last one 09-06-2010  . ESOPHAGOGASTRODUODENOSCOPY N/A 12/09/2013   Procedure: ESOPHAGOGASTRODUODENOSCOPY (EGD);  Surgeon: Arta Silence, MD;  Location: [facility] ENDOSCOPY;  Service: Endoscopy;  Laterality: N/A;  . EXTRACORPOREAL SHOCK WAVE LITHOTRIPSY  yrs ago  . INGUINAL HERNIA REPAIR Left child  . inguinal hernia repair  09/2017  . NISSEN FUNDOPLICATION  8099'I   open  . STONE EXTRACTION WITH BASKET N/A 08/18/2016   Procedure: STONE EXTRACTION WITH BASKET;  Surgeon: Carolan Clines,  MD;  Location:  SURGERY CENTER;  Service: Urology;  Laterality: N/A;  . THULIUM LASER TURP (TRANSURETHRAL RESECTION OF PROSTATE) N/A 08/18/2016   Procedure: THULIUM LASER BLADDER NECK INCISION AND BLADDER STONE REMOVAL;  Surgeon: Jethro Bolus, MD;  Location: [facility];  Service: Urology;  Laterality: N/A;  . TONSILLECTOMY  child  . TRANSTHORACIC ECHOCARDIOGRAM  11/18/2010   grade 1 diastolic dysfunction, ef 55-60%/  trivial MR and TR/ mild dilated RA   Social History   Occupational History  . Occupation: teaches constitutional Social worker  . 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 Smoking status: Never Smoker  . Smokeless tobacco: Never Used  Substance and Sexual Activity  . Alcohol use: Yes    Comment: social  . Drug use: No  . Sexual activity: Not on file

## 2019-06-06 ENCOUNTER — Encounter: Payer: Self-pay | Admitting: Orthopedic Surgery

## 2019-08-25 ENCOUNTER — Other Ambulatory Visit: Payer: Self-pay

## 2019-08-25 ENCOUNTER — Encounter: Payer: Self-pay | Admitting: Physician Assistant

## 2019-08-25 ENCOUNTER — Ambulatory Visit (INDEPENDENT_AMBULATORY_CARE_PROVIDER_SITE_OTHER): Payer: Medicare Other | Admitting: Physician Assistant

## 2019-08-25 VITALS — BP 102/62 | HR 57 | Temp 97.7°F | Ht 69.0 in | Wt 115.0 lb

## 2019-08-25 DIAGNOSIS — I471 Supraventricular tachycardia: Secondary | ICD-10-CM

## 2019-08-25 NOTE — Progress Notes (Signed)
Cardiology Office Note:    Date:  08/27/2019   ID:  Lee Nelson, DOB [birth year], MRN [id number]  PCP:  Lee Greathouse, MD  Cardiologist:  Lee Nose, MD  Electrophysiologist:  None   Referring MD: Lee Greathouse, MD   Chief Complaint  Patient presents with  . Follow-up    seen for Dr. Rennis Nelson    History of Present Illness:    Lee Nelson is a 79 y.o. male with a hx of thrombocytopenia and PSVT.  Patient was previously seen by Dr. Karsten Fells for evaluation of chest pain.  Myoview and echocardiogram were normal.  Echocardiogram obtained on 11/18/2010 showed EF 55 to 60%, grade 1 DD.  Despite complain of claudication symptom, lower extremity arterial Doppler was normal as well.  Previous 48-hour Holter monitor obtained in February 2017 demonstrated episode of PSVT that lasted less than 10 seconds.  Last Myoview obtained in February 2017 showed EF 54%, overall low risk study, medium defect of mild severity present in the basal inferior, mid inferior, apical inferior location which is a fixed defect consistent with diaphragmatic attenuation, no ischemia was noted.  He was last seen by Dr. Rennis Nelson in May 2019 at which time he complained of episode of amaurosis fugax.  He was undergoing work-up by [facility] neurologic and ultimately thought to have migraine with aura.  Carotid Doppler obtained in May 2019 as part of his amaurosis fugax work-up showed no significant blockage.  Normal hemodynamic seen in bilateral subclavian artery as well.  Bilateral vertebral arteries demonstrate antegrade flow.  Patient presents today for cardiology office visit.  He still has occasional feeling of curtain over his eye.  I recommend he discuss this with his neurologist.  Otherwise he denies any significant chest pain or shortness of breath.  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  He has noticed some memory decline as well.  He has no lower extremity edema, orthopnea or PND.  Overall, he does not  seems to have significant cardiac issue.  He can follow-up with cardiology service on a as needed basis.  At this point, he is on multiple over-the-counter supplement, however no prescription medication.  Given lack of palpitation, it does not appears he has any noticeable PSVT recurrence.  Past Medical History:  Diagnosis Date  . Anxiety   . Arthritis   . Bladder calculus   . BPH (benign prostatic hyperplasia)   . Chronic constipation   . Complication of anesthesia    " I had some coughing afterwards for a couple of days"--  per pt "perfers spinal anesthesia since general anesthesia congnitive issues when older"  . Diverticulosis of colon   . Dry eye syndrome of both eyes   . Environmental allergies   . GERD (gastroesophageal reflux disease)    occasional  . History of adenomatous polyp of colon    08/ 2004  . History of kidney stones   . History of squamous cell carcinoma in situ (SCCIS) of skin    s/p  excision 2013 facial areas and 06/ 2016 Nelson  . Migraine    eye migraine occasional  . Seasonal and perennial allergic rhinitis   . Thrombocytopenia ([facility])   . Tingling    hands and feet bilat , intermittantly-- per pt has lumbar bulging disk  . Urinary frequency   . Vocal fold atrophy    dysphonia-- per pt has to drink large amount of water to take even on pill  . Wears glasses  Past Surgical History:  Procedure Laterality Date  . APPENDECTOMY  child  . CARDIOVASCULAR STRESS TEST  05-17-2015  dr hilty   Low risk nuclear study w/ no ischemia/  normal LV function and wall motion , stress ef 54% (lvef 45-54%)  . CHOLECYSTECTOMY N/A 09/29/2014   Procedure: LAPAROSCOPIC CHOLECYSTECTOMY WITH INTRAOPERATIVE CHOLANGIOGRAM;  Surgeon: Ovidio Kin, MD;  Location: [facility];  Service: General;  Laterality: N/A;  . COLONOSCOPY  last one 09-06-2010  . ESOPHAGOGASTRODUODENOSCOPY N/A 12/09/2013   Procedure: ESOPHAGOGASTRODUODENOSCOPY (EGD);  Surgeon: Willis Modena, MD;  Location[facility]  ENDOSCOPY;  Service: Endoscopy;  Laterality: N/A;  . EXTRACORPOREAL SHOCK WAVE LITHOTRIPSY  yrs ago  . INGUINAL HERNIA REPAIR Left child  . inguinal hernia repair  09/2017  . NISSEN FUNDOPLICATION  1980's   open  . STONE EXTRACTION WITH BASKET N/A 08/18/2016   Procedure: STONE EXTRACTION WITH BASKET;  Surgeon: Jethro Bolus, MD;  Location: [facility];  Service: Urology;  Laterality: N/A;  . THULIUM LASER TURP (TRANSURETHRAL RESECTION OF PROSTATE) N/A 08/18/2016   Procedure: THULIUM LASER BLADDER NECK INCISION AND BLADDER STONE REMOVAL;  Surgeon: Jethro Bolus, MD;  Location: [facility];  Service: Urology;  Laterality: N/A;  . TONSILLECTOMY  child  . TRANSTHORACIC ECHOCARDIOGRAM  11/18/2010   grade 1 diastolic dysfunction, ef 55-60%/  trivial MR and TR/ mild dilated RA    Current Medications: Current Meds  Medication Sig  . ALPRAZolam (XANAX) 0.25 MG tablet Take 0.125 mg by mouth 2 (two) times daily as needed for sleep.   . Cholecalciferol (VITAMIN D-3) 1000 units CAPS Take 2,000 Units by mouth daily.   . Cranberry 400 MG CAPS Take 400-1,200 mg by mouth every morning.   Marland Kitchen GRAPE SEED ER PO Take by mouth.  Marland Kitchen HYDROcodone-acetaminophen (NORCO/VICODIN) 5-325 MG tablet 1 po q d prn pain  . L-Tyrosine 500 MG CAPS Take by mouth daily.  Marland Kitchen MAGNESIUM CITRATE PO Take 75 mg by mouth daily.  . Melatonin 2.5 MG CAPS Take 1.25 mg by mouth at bedtime. As needed  . Menaquinone-7 (VITAMIN K2 PO) Take 120-240 mg by mouth daily. Combo w/ Vit. D  . Milk Thistle 175 MG CAPS Take 1 capsule by mouth daily.   . Multiple Vitamin (MULTIVITAMIN) capsule Take 1 capsule by mouth every morning.   Marland Kitchen OVER THE COUNTER MEDICATION Take 3 capsules by mouth 2 (two) times daily. Lion's mane 0.5 gram/capsule  . OVER THE COUNTER MEDICATION Take 2 capsules by mouth every morning. Reparagen supplement  . OVER THE COUNTER MEDICATION Take 1 tablet by mouth daily as needed (for eye  migraine). Mygra Few  . OVER THE COUNTER MEDICATION CocoaVia flavonoids  . OVER THE COUNTER MEDICATION Sinus Clear Herb PO-- takes as needed  . Phosphatidylserine 100 MG CAPS Take 100 mg by mouth every morning.   . Probiotic Product (PROBIOTIC DAILY PO) Take by mouth.  . Propylene Glycol (SYSTANE BALANCE) 0.6 % SOLN 1 drop.  . sodium chloride (MURO 128) 5 % ophthalmic ointment Place 1 application into both eyes 2 (two) times daily.   . THEANINE PO Take 2 tablets by mouth 2 (two) times daily.   . Turmeric, Curcuma Longa, (CURCUMIN) POWD 220 mg by Does not apply route.  Marland Kitchen UNABLE TO FIND Med Name: suntheanine 125 mg 2x3/day  . UNABLE TO FIND Med Name: Acetyl-L-Carnitine  . vitamin C (ASCORBIC ACID) 250 MG tablet Take 250 mg by mouth daily.     Allergies:   Ciprofloxacin, Flagyl [  metronidazole], Metoclopramide hcl, Other, Septra [sulfamethoxazole-trimethoprim], and Silodosin   Social History   Socioeconomic History  . Marital status: Married    Spouse name: Gavin Pound  . Number of children: 1  . Years of education: [place], [place], [place]  . Highest education level: Not on file  Occupational History  . Occupation: teaches constitutional Social worker  . 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 Smoking status: Never Smoker  . Smokeless tobacco: Never Used  Substance and Sexual Activity  . Alcohol use: Yes    Comment: social  . Drug use: No  . Sexual activity: Not on file  Other Topics Concern  . Not on file  Social History Narrative   Patient lives at home wife.   Daily caffeine use   Social Determinants of Health   Financial Resource Strain:   . Difficulty of Paying Living Expenses:   Food Insecurity:   . Worried About Programme researcher, broadcasting/film/video in the Last Year:   . Barista in the Last Year:   Transportation Needs:   . Freight forwarder (Medical):   Marland Kitchen Lack of Transportation (Non-Medical):   Physical Activity:   . Days of Exercise per Week:   . Minutes  of Exercise per Session:   Stress:   . Feeling of Stress :   Social Connections:   . Frequency of Communication with Friends and Family:   . Frequency of Social Gatherings with Friends and Family:   . Attends Religious Services:   . Active Member of Clubs or Organizations:   . Attends Banker Meetings:   Marland Kitchen Marital Status:      Family History: The patient's family history includes Allergies in his mother; COPD in his father and mother; Cancer in his maternal grandfather; Heart failure in his mother; Other in his sister; Parkinsonism in his brother; Stroke in his father; Suicidality in his maternal aunt.  ROS:   Please see the history of present illness.     All other systems reviewed and are negative.  EKGs/Labs/Other Studies Reviewed:    The following studies were reviewed today:  Myoview 05/17/2015  The left ventricular ejection fraction is mildly decreased (45-54%).  Nuclear stress EF: 54%.  There was no ST segment deviation noted during stress.  This is a low risk study.  Defect 1: There is a medium defect of mild severity present in the basal inferior, mid inferior and apical inferior location. This is a fixed defect and in the setting of normal LVF, is consistent with diaphragmatic attenuation. No ischemia noted.  EKG:  EKG is ordered today.  The ekg ordered today demonstrates NSR without significant ST-T wave changes, chronically peaked T wave  Recent Labs: No results found for requested labs within last 8760 hours.  Recent Lipid Panel No results found for: CHOL, TRIG, HDL, CHOLHDL, VLDL, LDLCALC, LDLDIRECT  Physical Exam:    VS:  BP 102/62   Pulse (!) 57   Temp 97.7 F (36.5 C)   Ht 5\' 9"  (1.753 m)   Wt 115 lb (52.2 kg)   SpO2 96%   BMI 16.98 kg/m     Wt Readings from Last 3 Encounters:  08/25/19 115 lb (52.2 kg)  05/03/19 119 lb 3.2 oz (54.1 kg)  06/01/18 118 lb 9.6 oz (53.8 kg)     GEN:  Well nourished, well developed in no acute  distress HEENT: Normal NECK: No JVD; No carotid bruits LYMPHATICS: No  lymphadenopathy CARDIAC: RRR, no murmurs, rubs, gallops RESPIRATORY:  Clear to auscultation without rales, wheezing or rhonchi  ABDOMEN: Soft, non-tender, non-distended MUSCULOSKELETAL:  No edema; No deformity  SKIN: Warm and dry NEUROLOGIC:  Alert and oriented x 3 PSYCHIATRIC:  Normal affect   ASSESSMENT:    1. PSVT (paroxysmal supraventricular tachycardia) ([facility])    PLAN:    In order of problems listed above:  1. PSVT: No significant recurrence.  He is currently not on any rate control agent.  He is on multiple over-the-counter supplement, however no prescription medication.  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  At this point, he does not have any cardiac issue and his previous work-up has been negative.  He can follow-up with cardiology service on as-needed basis.   Medication Adjustments/Labs and Tests Ordered: Current medicines are reviewed at length with the patient today.  Concerns regarding medicines are outlined above.  Orders Placed This Encounter  Procedures  . EKG 12-Lead   No orders of the defined types were placed in this encounter.   Patient Instructions  Medication Instructions:  Your physician recommends that you continue on your current medications as directed. Please refer to the Current Medication list given to you today.  *If you need a refill on your cardiac medications before your next appointment, please call your pharmacy*  Lab Work: NONE ordered at this time of appointment   If you have labs (blood work) drawn today and your tests are completely normal, you will receive your results only by: Marland Kitchen MyChart Message (if you have MyChart) OR . A paper copy in the mail If you have any lab test that is abnormal or we need to change your treatment, we will call you to review the results.  Testing/Procedures: NONE ordered at this time of appointment   Follow-Up: At [facility], you and your health needs are our priority.  As part of our continuing mission to provide you with exceptional heart care, we have created designated Provider Care Teams.  These Care Teams include your primary Cardiologist (physician) and Advanced Practice Providers (APPs -  Physician Assistants and Nurse Practitioners) who all work together to provide you with the care you need, when you need it.  Your next appointment:   AS NEEDED    The format for your next appointment:   Either In Person or Virtual  Provider:   You may see Pixie Casino, MD or one of the following Advanced Practice Providers on your designated Care Team:    Almyra Deforest, PA-C  Fabian Sharp, PA-C or   Roby Lofts, PA-C  Other Instructions      Signed, Almyra Deforest, [place]  08/27/2019 11:16 PM    [place]

## 2019-08-25 NOTE — Patient Instructions (Signed)
Medication Instructions:  Your physician recommends that you continue on your current medications as directed. Please refer to the Current Medication list given to you today.  *If you need a refill on your cardiac medications before your next appointment, please call your pharmacy*  Lab Work: NONE ordered at this time of appointment   If you have labs (blood work) drawn today and your tests are completely normal, you will receive your results only by: Marland Kitchen MyChart Message (if you have MyChart) OR . A paper copy in the mail If you have any lab test that is abnormal or we need to change your treatment, we will call you to review the results.  Testing/Procedures: NONE ordered at this time of appointment   Follow-Up: At [facility], you and your health needs are our priority.  As part of our continuing mission to provide you with exceptional heart care, we have created designated Provider Care Teams.  These Care Teams include your primary Cardiologist (physician) and Advanced Practice Providers (APPs -  Physician Assistants and Nurse Practitioners) who all work together to provide you with the care you need, when you need it.  Your next appointment:   AS NEEDED    The format for your next appointment:   Either In Person or Virtual  Provider:   You may see Chrystie Nose, MD or one of the following Advanced Practice Providers on your designated Care Team:    Azalee Course, PA-C  Micah Flesher, PA-C or   Judy Pimple, [place]  Other Instructions

## 2019-08-27 ENCOUNTER — Encounter: Payer: Self-pay | Admitting: Physician Assistant

## 2019-09-02 ENCOUNTER — Other Ambulatory Visit: Payer: Self-pay | Admitting: Physician Assistant

## 2019-09-02 DIAGNOSIS — R1030 Lower abdominal pain, unspecified: Secondary | ICD-10-CM

## 2019-09-19 ENCOUNTER — Other Ambulatory Visit: Payer: Medicare Other

## 2019-10-06 ENCOUNTER — Telehealth: Payer: Self-pay | Admitting: Internal Medicine

## 2019-10-06 NOTE — Telephone Encounter (Signed)
Hard to say what is causing his symptoms, although varicose veins with swelling could be a cause of his discomfort. We can see him in the office if he wishes to be evaluated for that.  Dr Rexene Edison

## 2019-10-06 NOTE — Telephone Encounter (Signed)
Returned call to patient- he states that he has noticed some increase bruising in both of his legs, he states he is not in any extensive pain, but it is like an achy pain.  Patient does mention that his left leg is worse for him- he states it feels warmer than the other, some slight redness in spots, and it feels puffy, the right leg has a 'saggy' area that causes him some pain as well. Patient states it does not worsen with moving or walking, but does get worse at night when he is laying in bed. And he has noticed a lot of cramping in his legs as well.   He has had no other symptoms- no chest pain, no changes in SOB, and no diet changes.  Patient did recently start Meloxicam 15 mg daily because of the swelling in his prostate.   I did advise with patient I would send a message to MD to advise and we would call back with recommendations.

## 2019-10-06 NOTE — Telephone Encounter (Signed)
Called patient, he would like to be seen for his leg pain- he did say he liked the PA he seen previously- which was Azalee Course, [place] and would like to see him. An open spot for Tuesday was available. Scheduled patient to be seen here.  Patient verbalized understanding.

## 2019-10-06 NOTE — Telephone Encounter (Signed)
Lee Nelson is calling stating he is having some swelling and bruising in the calves of both of his legs & he doesn't know if it is due to fluid build up or not. He states when he last saw Lee Nelson it was not causing him pain, but he is experiencing pain now. He states it is not severe pain, but he would like to know what Dr. Rennis Nelson advises in regards to it. He was unsure if the marks on his legs were verucose veins or bruises. Lee Nelson also states he has a swollen prostate he has been put on antiinflammatories for as well as constipation. Please advise.

## 2019-10-11 ENCOUNTER — Other Ambulatory Visit: Payer: Self-pay

## 2019-10-11 ENCOUNTER — Encounter: Payer: Self-pay | Admitting: Physician Assistant

## 2019-10-11 ENCOUNTER — Ambulatory Visit (INDEPENDENT_AMBULATORY_CARE_PROVIDER_SITE_OTHER): Payer: Medicare Other | Admitting: Physician Assistant

## 2019-10-11 VITALS — BP 106/52 | HR 56 | Temp 97.0°F | Ht 68.5 in | Wt 119.6 lb

## 2019-10-11 DIAGNOSIS — I471 Supraventricular tachycardia: Secondary | ICD-10-CM | POA: Diagnosis not present

## 2019-10-11 DIAGNOSIS — M79606 Pain in leg, unspecified: Secondary | ICD-10-CM

## 2019-10-11 NOTE — Progress Notes (Signed)
Cardiology Office Note:    Date:  10/13/2019   ID:  Lee Nelson, DOB 1940/08/19, MRN [id number]  PCP:  Chilton Greathouse, MD  [facility] Cardiologist:  Chrystie Nose, MD  [facility] Electrophysiologist:  None   Referring MD: Chilton Greathouse, MD   Chief Complaint  Patient presents with  . Follow-up    seen for Dr. Rennis Golden    History of Present Illness:    Lee Nelson is a 79 y.o. male with a hx of thrombocytopenia and PSVT.  Patient was previously seen by Dr. Karsten Fells for evaluation of chest pain.  Myoview and echocardiogram were normal.  Echocardiogram obtained on 11/18/2010 showed EF 55 to 60%, grade 1 DD.  Despite complain of claudication symptom, lower extremity arterial Doppler was normal as well.  Previous 48-hour Holter monitor obtained in February 2017 demonstrated episode of PSVT that lasted less than 10 seconds.  Last Myoview obtained in February 2017 showed EF 54%, overall low risk study, medium defect of mild severity present in the basal inferior, mid inferior, apical inferior location which is a fixed defect consistent with diaphragmatic attenuation, no ischemia was noted.  He was last seen by Dr. Rennis Golden in May 2019 at which time he complained of episode of amaurosis fugax.  He was undergoing work-up by [facility] neurologic and ultimately thought to have migraine with aura.  Carotid Doppler obtained in May 2019 as part of his amaurosis fugax work-up showed no significant blockage.  Normal hemodynamic seen in bilateral subclavian artery as well.  Bilateral vertebral arteries demonstrate antegrade flow.  I last saw the patient on 08/25/2019 at which time he still had occasional feeling of curtain over his eyes.  I recommended him to discuss this with his neurologist.  He presents today for evaluation of leg pain.  This occurs on both side in the distal calf.  He has also noticing some bruising.  However the bruising area looks more like venous varicosities.  He has been  holding turmeric which may increase the chance of bleeding.  As far as his leg discomfort, he noticed it more so when he is at rest, his pain is not exacerbated by physical activity.  He also has some chronic atypical chest discomfort for and or shortness of breath that does not correlate with activity either.  Chest discomfort and shortness of breath has been going on for years and happens sporadically, he felt that they are related to acid reflux symptom as treatment for acid reflux often help from the symptom.  He exercises quite regularly on a stationary bicycle and also walks on a weekly basis, he does not have any exertional chest discomfort or shortness of breath.  He also does not have any exertional claudication symptom either.  I suspect his leg issue is likely related to varicose veins.  On exam, he has 2+ dorsalis pedis pulse and a 2+ posterior tibial pulse on the left side.  On the right side, I was unable to feel dorsalis pedis pulse, however he has 2+ posterior tibial pulse.  I recommended continue observation for his leg pain.  He may follow-up with Dr. Rennis Golden in 6 months.  Past Medical History:  Diagnosis Date  . Anxiety   . Arthritis   . Bladder calculus   . BPH (benign prostatic hyperplasia)   . Chronic constipation   . Complication of anesthesia    " I had some coughing afterwards for a couple of days"--  per pt "perfers spinal anesthesia  since general anesthesia congnitive issues when older"  . Diverticulosis of colon   . Dry eye syndrome of both eyes   . Environmental allergies   . GERD (gastroesophageal reflux disease)    occasional  . History of adenomatous polyp of colon    08/ 2004  . History of kidney stones   . History of squamous cell carcinoma in situ (SCCIS) of skin    s/p  excision 2013 facial areas and 06/ 2016 nose  . Migraine    eye migraine occasional  . Seasonal and perennial allergic rhinitis   . Thrombocytopenia (HCC)   . Tingling    hands and feet  bilat , intermittantly-- per pt has lumbar bulging disk  . Urinary frequency   . Vocal fold atrophy    dysphonia-- per pt has to drink large amount of water to take even on pill  . Wears glasses     Past Surgical History:  Procedure Laterality Date  . APPENDECTOMY  child  . CARDIOVASCULAR STRESS TEST  05-17-2015  dr hilty   Low risk nuclear study w/ no ischemia/  normal LV function and wall motion , stress ef 54% (lvef 45-54%)  . CHOLECYSTECTOMY N/A 09/29/2014   Procedure: LAPAROSCOPIC CHOLECYSTECTOMY WITH INTRAOPERATIVE CHOLANGIOGRAM;  Surgeon: Ovidio Kin, MD;  Location: [facility];  Service: General;  Laterality: N/A;  . COLONOSCOPY  last one 09-06-2010  . ESOPHAGOGASTRODUODENOSCOPY N/A 12/09/2013   Procedure: ESOPHAGOGASTRODUODENOSCOPY (EGD);  Surgeon: Willis Modena, MD;  Location: [facility] ENDOSCOPY;  Service: Endoscopy;  Laterality: N/A;  . EXTRACORPOREAL SHOCK WAVE LITHOTRIPSY  yrs ago  . INGUINAL HERNIA REPAIR Left child  . inguinal hernia repair  09/2017  . NISSEN FUNDOPLICATION  1980's   open  . STONE EXTRACTION WITH BASKET N/A 08/18/2016   Procedure: STONE EXTRACTION WITH BASKET;  Surgeon: Jethro Bolus, MD;  Location: [facility];  Service: Urology;  Laterality: N/A;  . THULIUM LASER TURP (TRANSURETHRAL RESECTION OF PROSTATE) N/A 08/18/2016   Procedure: THULIUM LASER BLADDER NECK INCISION AND BLADDER STONE REMOVAL;  Surgeon: Jethro Bolus, MD;  [facility];  Service: Urology;  Laterality: N/A;  . TONSILLECTOMY  child  . TRANSTHORACIC ECHOCARDIOGRAM  11/18/2010   grade 1 diastolic dysfunction, ef 55-60%/  trivial MR and TR/ mild dilated RA    Current Medications: Current Meds  Medication Sig  . ALPRAZolam (XANAX) 0.25 MG tablet Take 0.125 mg by mouth 2 (two) times daily as needed for sleep.   . Cholecalciferol (VITAMIN D-3) 1000 units CAPS Take 2,000 Units by mouth daily.   . Cranberry 400 MG CAPS Take 400-1,200 mg by mouth every  morning.   . dorzolamide-timolol (COSOPT) 22.3-6.8 MG/ML ophthalmic solution 1 drop 2 (two) times daily.  Marland Kitchen L-Tyrosine 500 MG CAPS Take by mouth daily.  Marland Kitchen MAGNESIUM CITRATE PO Take 75 mg by mouth daily.  . Melatonin 2.5 MG CAPS Take 1.25 mg by mouth at bedtime. As needed  . meloxicam (MOBIC) 15 MG tablet Take 15 mg by mouth daily.  . Menaquinone-7 (VITAMIN K2 PO) Take 120-240 mg by mouth daily. Combo w/ Vit. D  . Milk Thistle 175 MG CAPS Take 1 capsule by mouth daily.   Marland Kitchen OVER THE COUNTER MEDICATION Take 3 capsules by mouth 2 (two) times daily. Lion's mane 0.5 gram/capsule  . OVER THE COUNTER MEDICATION Take 2 capsules by mouth every morning. Reparagen supplement  . OVER THE COUNTER MEDICATION Take 1 tablet by mouth daily as needed (for eye migraine). Mygra  Few  . OVER THE COUNTER MEDICATION Sinus Clear Herb PO-- takes as needed  . Phosphatidylserine 100 MG CAPS Take 100 mg by mouth every morning.   . Probiotic Product (PROBIOTIC DAILY PO) Take by mouth.  . Propylene Glycol (SYSTANE BALANCE) 0.6 % SOLN 1 drop.  . sodium chloride (MURO 128) 5 % ophthalmic ointment Place 1 application into both eyes 2 (two) times daily.   . THEANINE PO Take 2 tablets by mouth 2 (two) times daily.   . Turmeric, Curcuma Longa, (CURCUMIN) POWD 220 mg by Does not apply route.  Marland Kitchen UNABLE TO FIND Med Name: suntheanine 125 mg 2x3/day  . UNABLE TO FIND Med Name: Acetyl-L-Carnitine  . vitamin C (ASCORBIC ACID) 250 MG tablet Take 250 mg by mouth daily.     Allergies:   Ciprofloxacin, Flagyl [metronidazole], Metoclopramide hcl, Other, Septra [sulfamethoxazole-trimethoprim], and Silodosin   Social History   Socioeconomic History  . Marital status: Married    Spouse name: Gavin Pound  . Number of children: 1  . Years of education: [place], [place], [place]  . Highest education level: Not on file  Occupational History  . Occupation: teaches constitutional Social worker  . Occupation: Photographer: [employer]  Tobacco Use   . Smoking status: Never Smoker  . Smokeless tobacco: Never Used  Substance and Sexual Activity  . Alcohol use: Yes    Comment: social  . Drug use: No  . Sexual activity: Not on file  Other Topics Concern  . Not on file  Social History Narrative   Patient lives at home wife.   Daily caffeine use   Social Determinants of Health   Financial Resource Strain:   . Difficulty of Paying Living Expenses:   Food Insecurity:   . Worried About Programme researcher, broadcasting/film/video in the Last Year:   . Barista in the Last Year:   Transportation Needs:   . Freight forwarder (Medical):   Marland Kitchen Lack of Transportation (Non-Medical):   Physical Activity:   . Days of Exercise per Week:   . Minutes of Exercise per Session:   Stress:   . Feeling of Stress :   Social Connections:   . Frequency of Communication with Friends and Family:   . Frequency of Social Gatherings with Friends and Family:   . Attends Religious Services:   . Active Member of Clubs or Organizations:   . Attends Banker Meetings:   Marland Kitchen Marital Status:      Family History: The patient's family history includes Allergies in his mother; COPD in his father and mother; Cancer in his maternal grandfather; Heart failure in his mother; Other in his sister; Parkinsonism in his brother; Stroke in his father; Suicidality in his maternal aunt.  ROS:   Please see the history of present illness.     All other systems reviewed and are negative.  EKGs/Labs/Other Studies Reviewed:    The following studies were reviewed today:  Carotid US 11/08/2018 Summary:  Right Carotid: There is no evidence of stenosis in the right ICA.   Left Carotid: Velocities in the left ICA are consistent with a 1-39%  stenosis.   Vertebrals: Bilateral vertebral arteries demonstrate antegrade flow.  Subclavians: Normal flow hemodynamics were seen in bilateral subclavian arteries.   EKG:  EKG is not ordered today.    Recent Labs: No results found  for requested labs within last 8760 hours.  Recent Lipid Panel No results found for: CHOL, TRIG,  HDL, CHOLHDL, VLDL, LDLCALC, LDLDIRECT  Physical Exam:    VS:  BP (!) 106/52   Pulse (!) 56   Temp (!) 97 F (36.1 C) (Oral) Comment (Src): Forehead  Ht 5' 8.5" (1.74 m)   Wt 119 lb 9.6 oz (54.3 kg)   SpO2 97%   BMI 17.92 kg/m     Wt Readings from Last 3 Encounters:  10/11/19 119 lb 9.6 oz (54.3 kg)  08/25/19 115 lb (52.2 kg)  05/03/19 119 lb 3.2 oz (54.1 kg)     GEN:  Well nourished, well developed in no acute distress HEENT: Normal NECK: No JVD; No carotid bruits LYMPHATICS: No lymphadenopathy CARDIAC: RRR, no murmurs, rubs, gallops RESPIRATORY:  Clear to auscultation without rales, wheezing or rhonchi  ABDOMEN: Soft, non-tender, non-distended MUSCULOSKELETAL:  No edema; No deformity  SKIN: Warm and dry NEUROLOGIC:  Alert and oriented x 3 PSYCHIATRIC:  Normal affect   ASSESSMENT:    1. Pain of lower extremity, unspecified laterality   2. PSVT (paroxysmal supraventricular tachycardia) (HCC)    PLAN:    In order of problems listed above:  1. Lower extremity pain: I suspect his lower extremity pain is related to varicose veins.  I will proceed with lower extremity ABI, however if ABI is normal, I would not recommend any further work-up  2. PSVT: No recent palpitation.   Medication Adjustments/Labs and Tests Ordered: Current medicines are reviewed at length with the patient today.  Concerns regarding medicines are outlined above.  Orders Placed This Encounter  Procedures  . VAS US ABI WITH/WO TBI   No orders of the defined types were placed in this encounter.   Patient Instructions  Medication Instructions:  Your physician recommends that you continue on your current medications as directed. Please refer to the Current Medication list given to you today.  *If you need a refill on your cardiac medications before your next appointment, please call your  pharmacy*  Lab Work: NONE ordered at this time of appointment   If you have labs (blood work) drawn today and your tests are completely normal, you will receive your results only by: Marland Kitchen. MyChart Message (if you have MyChart) OR . A paper copy in the mail If you have any lab test that is abnormal or we need to change your treatment, we will call you to review the results.  Testing/Procedures: Your physician has requested that you have an ankle brachial index (ABI). During this test an ultrasound and blood pressure cuff are used to evaluate the arteries that supply the arms and legs with blood. Allow thirty minutes for this exam. There are no restrictions or special instructions.   Please schedule for 1 week  Follow-Up: At [facility], you and your health needs are our priority.  As part of our continuing mission to provide you with exceptional heart care, we have created designated Provider Care Teams.  These Care Teams include your primary Cardiologist (physician) and Advanced Practice Providers (APPs -  Physician Assistants and Nurse Practitioners) who all work together to provide you with the care you need, when you need it.  Your next appointment:   6 month(s)  The format for your next appointment:   In Person  Provider:   Kirtland BouchardK. Italyhad Hilty, MD  Other Instructions  Continue to monitor your leg. If pain becomes worse with exertion give our office a call.     Ramond DialSigned, Evaluna Utke, [place]PA  10/13/2019 10:55 PM    [facility]

## 2019-10-11 NOTE — Patient Instructions (Addendum)
Medication Instructions:  Your physician recommends that you continue on your current medications as directed. Please refer to the Current Medication list given to you today.  *If you need a refill on your cardiac medications before your next appointment, please call your pharmacy*  Lab Work: NONE ordered at this time of appointment   If you have labs (blood work) drawn today and your tests are completely normal, you will receive your results only by: Marland Kitchen MyChart Message (if you have MyChart) OR . A paper copy in the mail If you have any lab test that is abnormal or we need to change your treatment, we will call you to review the results.  Testing/Procedures: Your physician has requested that you have an ankle brachial index (ABI). During this test an ultrasound and blood pressure cuff are used to evaluate the arteries that supply the arms and legs with blood. Allow thirty minutes for this exam. There are no restrictions or special instructions.   Please schedule for 1 week  Follow-Up: At [facility], you and your health needs are our priority.  As part of our continuing mission to provide you with exceptional heart care, we have created designated Provider Care Teams.  These Care Teams include your primary Cardiologist (physician) and Advanced Practice Providers (APPs -  Physician Assistants and Nurse Practitioners) who all work together to provide you with the care you need, when you need it.  Your next appointment:   6 month(s)  The format for your next appointment:   In Person  Provider:   Kirtland Bouchard [place] Hilty, MD  Other Instructions  Continue to monitor your leg. If pain becomes worse with exertion give our office a call.

## 2019-10-13 ENCOUNTER — Encounter: Payer: Self-pay | Admitting: Physician Assistant

## 2019-10-19 ENCOUNTER — Other Ambulatory Visit: Payer: Self-pay | Admitting: Physician Assistant

## 2019-10-19 DIAGNOSIS — M79606 Pain in leg, unspecified: Secondary | ICD-10-CM

## 2019-10-20 ENCOUNTER — Other Ambulatory Visit: Payer: Self-pay | Admitting: Physician Assistant

## 2019-10-20 DIAGNOSIS — I739 Peripheral vascular disease, unspecified: Secondary | ICD-10-CM

## 2019-10-20 DIAGNOSIS — M79606 Pain in leg, unspecified: Secondary | ICD-10-CM

## 2019-10-21 ENCOUNTER — Ambulatory Visit (HOSPITAL_COMMUNITY)
Admission: RE | Admit: 2019-10-21 | Discharge: 2019-10-21 | Disposition: A | Discharge status: Home / Self Care | Payer: Medicare Other | Source: Ambulatory Visit | Attending: Cardiovascular Disease | Admitting: Cardiovascular Disease

## 2019-10-21 ENCOUNTER — Other Ambulatory Visit: Payer: Self-pay

## 2019-10-21 DIAGNOSIS — M79605 Pain in left leg: Secondary | ICD-10-CM

## 2019-10-21 DIAGNOSIS — I739 Peripheral vascular disease, unspecified: Secondary | ICD-10-CM | POA: Insufficient documentation

## 2019-10-21 DIAGNOSIS — M79606 Pain in leg, unspecified: Secondary | ICD-10-CM

## 2019-10-21 DIAGNOSIS — M79604 Pain in right leg: Secondary | ICD-10-CM | POA: Diagnosis not present

## 2019-10-28 ENCOUNTER — Other Ambulatory Visit: Payer: Self-pay | Admitting: Gastroenterology

## 2019-10-28 DIAGNOSIS — R198 Other specified symptoms and signs involving the digestive system and abdomen: Secondary | ICD-10-CM

## 2019-10-28 DIAGNOSIS — K921 Melena: Secondary | ICD-10-CM

## 2019-10-28 DIAGNOSIS — R109 Unspecified abdominal pain: Secondary | ICD-10-CM

## 2019-11-11 ENCOUNTER — Ambulatory Visit
Admission: RE | Admit: 2019-11-11 | Discharge: 2019-11-11 | Disposition: A | Discharge status: Home / Self Care | Payer: Medicare Other | Source: Ambulatory Visit | Attending: Gastroenterology | Admitting: Gastroenterology

## 2019-11-11 DIAGNOSIS — R109 Unspecified abdominal pain: Secondary | ICD-10-CM

## 2019-11-11 DIAGNOSIS — R198 Other specified symptoms and signs involving the digestive system and abdomen: Secondary | ICD-10-CM

## 2019-11-11 DIAGNOSIS — K921 Melena: Secondary | ICD-10-CM

## 2019-11-11 MED ORDER — IOPAMIDOL (ISOVUE-300) INJECTION 61%
100.0000 mL | Freq: Once | INTRAVENOUS | Status: AC | PRN
  Administered 2019-11-11: 100 mL via INTRAVENOUS

## 2019-12-31 ENCOUNTER — Encounter (HOSPITAL_COMMUNITY): Payer: Self-pay

## 2019-12-31 ENCOUNTER — Emergency Department (HOSPITAL_COMMUNITY): Payer: Medicare Other

## 2019-12-31 ENCOUNTER — Inpatient Hospital Stay (HOSPITAL_COMMUNITY)
Admission: EM | Admit: 2019-12-31 | Discharge: 2020-01-05 | DRG: 388 | Disposition: A | Discharge status: Home Health | Payer: Medicare Other | Attending: Internal Medicine | Admitting: Internal Medicine

## 2019-12-31 ENCOUNTER — Other Ambulatory Visit: Payer: Self-pay

## 2019-12-31 ENCOUNTER — Inpatient Hospital Stay (HOSPITAL_COMMUNITY): Payer: Medicare Other

## 2019-12-31 DIAGNOSIS — Z823 Family history of stroke: Secondary | ICD-10-CM | POA: Diagnosis not present

## 2019-12-31 DIAGNOSIS — E860 Dehydration: Secondary | ICD-10-CM

## 2019-12-31 DIAGNOSIS — Z8249 Family history of ischemic heart disease and other diseases of the circulatory system: Secondary | ICD-10-CM | POA: Diagnosis not present

## 2019-12-31 DIAGNOSIS — Z791 Long term (current) use of non-steroidal anti-inflammatories (NSAID): Secondary | ICD-10-CM | POA: Diagnosis not present

## 2019-12-31 DIAGNOSIS — Z888 Allergy status to other drugs, medicaments and biological substances status: Secondary | ICD-10-CM

## 2019-12-31 DIAGNOSIS — E872 Acidosis: Secondary | ICD-10-CM | POA: Diagnosis present

## 2019-12-31 DIAGNOSIS — M1990 Unspecified osteoarthritis, unspecified site: Secondary | ICD-10-CM | POA: Diagnosis present

## 2019-12-31 DIAGNOSIS — Z809 Family history of malignant neoplasm, unspecified: Secondary | ICD-10-CM | POA: Diagnosis not present

## 2019-12-31 DIAGNOSIS — Z820 Family history of epilepsy and other diseases of the nervous system: Secondary | ICD-10-CM | POA: Diagnosis not present

## 2019-12-31 DIAGNOSIS — K56609 Unspecified intestinal obstruction, unspecified as to partial versus complete obstruction: Secondary | ICD-10-CM | POA: Diagnosis not present

## 2019-12-31 DIAGNOSIS — Z79899 Other long term (current) drug therapy: Secondary | ICD-10-CM | POA: Diagnosis not present

## 2019-12-31 DIAGNOSIS — Z681 Body mass index (BMI) 19 or less, adult: Secondary | ICD-10-CM | POA: Diagnosis not present

## 2019-12-31 DIAGNOSIS — E43 Unspecified severe protein-calorie malnutrition: Secondary | ICD-10-CM | POA: Diagnosis present

## 2019-12-31 DIAGNOSIS — R54 Age-related physical debility: Secondary | ICD-10-CM | POA: Diagnosis present

## 2019-12-31 DIAGNOSIS — F419 Anxiety disorder, unspecified: Secondary | ICD-10-CM | POA: Diagnosis present

## 2019-12-31 DIAGNOSIS — R338 Other retention of urine: Secondary | ICD-10-CM | POA: Diagnosis present

## 2019-12-31 DIAGNOSIS — Z86008 Personal history of in-situ neoplasm of other site: Secondary | ICD-10-CM

## 2019-12-31 DIAGNOSIS — K5909 Other constipation: Secondary | ICD-10-CM | POA: Diagnosis not present

## 2019-12-31 DIAGNOSIS — R188 Other ascites: Secondary | ICD-10-CM | POA: Diagnosis present

## 2019-12-31 DIAGNOSIS — D649 Anemia, unspecified: Secondary | ICD-10-CM | POA: Diagnosis present

## 2019-12-31 DIAGNOSIS — Z881 Allergy status to other antibiotic agents status: Secondary | ICD-10-CM | POA: Diagnosis not present

## 2019-12-31 DIAGNOSIS — R1084 Generalized abdominal pain: Secondary | ICD-10-CM

## 2019-12-31 DIAGNOSIS — Z825 Family history of asthma and other chronic lower respiratory diseases: Secondary | ICD-10-CM

## 2019-12-31 DIAGNOSIS — G43909 Migraine, unspecified, not intractable, without status migrainosus: Secondary | ICD-10-CM | POA: Diagnosis present

## 2019-12-31 DIAGNOSIS — D696 Thrombocytopenia, unspecified: Secondary | ICD-10-CM | POA: Diagnosis not present

## 2019-12-31 DIAGNOSIS — N401 Enlarged prostate with lower urinary tract symptoms: Secondary | ICD-10-CM | POA: Diagnosis present

## 2019-12-31 DIAGNOSIS — K219 Gastro-esophageal reflux disease without esophagitis: Secondary | ICD-10-CM | POA: Diagnosis present

## 2019-12-31 DIAGNOSIS — Z0189 Encounter for other specified special examinations: Secondary | ICD-10-CM

## 2019-12-31 DIAGNOSIS — D693 Immune thrombocytopenic purpura: Secondary | ICD-10-CM | POA: Diagnosis not present

## 2019-12-31 DIAGNOSIS — G8929 Other chronic pain: Secondary | ICD-10-CM | POA: Diagnosis present

## 2019-12-31 DIAGNOSIS — Z20822 Contact with and (suspected) exposure to covid-19: Secondary | ICD-10-CM | POA: Diagnosis present

## 2019-12-31 DIAGNOSIS — E162 Hypoglycemia, unspecified: Secondary | ICD-10-CM | POA: Diagnosis not present

## 2019-12-31 DIAGNOSIS — Z882 Allergy status to sulfonamides status: Secondary | ICD-10-CM | POA: Diagnosis not present

## 2019-12-31 LAB — URINALYSIS, ROUTINE W REFLEX MICROSCOPIC
Bacteria, UA: NONE SEEN
Bilirubin Urine: NEGATIVE
Glucose, UA: NEGATIVE mg/dL
Hgb urine dipstick: NEGATIVE
Ketones, ur: 20 mg/dL — AB
Leukocytes,Ua: NEGATIVE
Nitrite: NEGATIVE
Protein, ur: 30 mg/dL — AB
Specific Gravity, Urine: 1.025 (ref 1.005–1.030)
pH: 5 (ref 5.0–8.0)

## 2019-12-31 LAB — COMPREHENSIVE METABOLIC PANEL
ALT: 22 U/L (ref 0–44)
AST: 22 U/L (ref 15–41)
Albumin: 4 g/dL (ref 3.5–5.0)
Alkaline Phosphatase: 72 U/L (ref 38–126)
Anion gap: 14 (ref 5–15)
BUN: 18 mg/dL (ref 8–23)
CO2: 21 mmol/L — ABNORMAL LOW (ref 22–32)
Calcium: 9.3 mg/dL (ref 8.9–10.3)
Chloride: 102 mmol/L (ref 98–111)
Creatinine, Ser: 0.56 mg/dL — ABNORMAL LOW (ref 0.61–1.24)
GFR calc Af Amer: >60 mL/min (ref >60)
GFR calc non Af Amer: >60 mL/min (ref >60)
Glucose, Bld: 106 mg/dL — ABNORMAL HIGH (ref 70–99)
Potassium: 4.3 mmol/L (ref 3.5–5.1)
Sodium: 137 mmol/L (ref 135–145)
Total Bilirubin: 0.9 mg/dL (ref 0.3–1.2)
Total Protein: 6.4 g/dL — ABNORMAL LOW (ref 6.5–8.1)

## 2019-12-31 LAB — CBC WITH DIFFERENTIAL/PLATELET
Abs Immature Granulocytes: 0.02 10*3/uL (ref 0.00–0.07)
Basophils Absolute: 0 10*3/uL (ref 0.0–0.1)
Basophils Relative: 0 %
Eosinophils Absolute: 0 10*3/uL (ref 0.0–0.5)
Eosinophils Relative: 0 %
HCT: 45.1 % (ref 39.0–52.0)
Hemoglobin: 15.5 g/dL (ref 13.0–17.0)
Immature Granulocytes: 0 %
Lymphocytes Relative: 7 %
Lymphs Abs: 0.6 10*3/uL — ABNORMAL LOW (ref 0.7–4.0)
MCH: 33.8 pg (ref 26.0–34.0)
MCHC: 34.4 g/dL (ref 30.0–36.0)
MCV: 98.3 fL (ref 80.0–100.0)
Monocytes Absolute: 0.3 10*3/uL (ref 0.1–1.0)
Monocytes Relative: 3 %
Neutro Abs: 7.3 10*3/uL (ref 1.7–7.7)
Neutrophils Relative %: 90 %
Platelets: 100 10*3/uL — ABNORMAL LOW (ref 150–400)
RBC: 4.59 MIL/uL (ref 4.22–5.81)
RDW: 13.4 % (ref 11.5–15.5)
WBC: 8.3 10*3/uL (ref 4.0–10.5)
nRBC: 0 % (ref 0.0–0.2)

## 2019-12-31 LAB — RESPIRATORY PANEL BY RT PCR (FLU A&B, COVID)
Influenza A by PCR: NEGATIVE
Influenza B by PCR: NEGATIVE
SARS Coronavirus 2 by RT PCR: NEGATIVE

## 2019-12-31 LAB — LIPASE, BLOOD: Lipase: 26 U/L (ref 11–51)

## 2019-12-31 MED ORDER — DIATRIZOATE MEGLUMINE & SODIUM 66-10 % PO SOLN
90.0000 mL | Freq: Once | ORAL | Status: AC
  Administered 2019-12-31: 90 mL via NASOGASTRIC
  Filled 2019-12-31: qty 90

## 2019-12-31 MED ORDER — FENTANYL CITRATE (PF) 100 MCG/2ML IJ SOLN: 25.0000 ug | INTRAMUSCULAR | Status: DC | PRN

## 2019-12-31 MED ORDER — SODIUM CHLORIDE 0.9 % IV SOLN: Freq: Once | INTRAVENOUS | Status: AC

## 2019-12-31 MED ORDER — IOHEXOL 300 MG/ML  SOLN
100.0000 mL | Freq: Once | INTRAMUSCULAR | Status: AC | PRN
  Administered 2019-12-31: 100 mL via INTRAVENOUS

## 2019-12-31 MED ORDER — ONDANSETRON HCL 4 MG/2ML IJ SOLN
4.0000 mg | Freq: Once | INTRAMUSCULAR | Status: DC
  Filled 2019-12-31: qty 2

## 2019-12-31 MED ORDER — ONDANSETRON HCL 4 MG/2ML IJ SOLN
4.0000 mg | Freq: Four times a day (QID) | INTRAMUSCULAR | Status: DC | PRN
  Administered 2019-12-31: 4 mg via INTRAVENOUS
  Filled 2019-12-31: qty 2

## 2019-12-31 MED ORDER — SODIUM CHLORIDE (PF) 0.9 % IJ SOLN
INTRAMUSCULAR | Status: AC
  Filled 2019-12-31: qty 50

## 2019-12-31 MED ORDER — LORAZEPAM 2 MG/ML IJ SOLN: 0.5000 mg | Freq: Two times a day (BID) | INTRAMUSCULAR | Status: DC | PRN

## 2019-12-31 MED ORDER — ONDANSETRON HCL 4 MG PO TABS: 4.0000 mg | ORAL_TABLET | Freq: Four times a day (QID) | ORAL | Status: DC | PRN

## 2019-12-31 MED ORDER — LACTATED RINGERS IV SOLN: INTRAVENOUS | Status: DC

## 2019-12-31 NOTE — H&P (Addendum)
**Note Lee via Obfuscation** History and Physical    [name] BWL:[id number] DOB: 02/03/1941 DOA: 12/31/2019  PCP: Chilton Greathouse, MD Nelson coming from: Home.  Chief Complaint: Abdominal pain  HPI: Lee Nelson is a 79 y.o. male with history of chronic constipation followed by GI (Dr. Dulce Sellar), chronic thrombocytopenia, BPH with LUTS, anxiety, osteoarthritis and multiple abdominal surgeries in the past presenting with abdominal pain, bloating and not passing gas for 1 day.  He has history of chronic constipation.  He used a stool softener and his small soft bowel movements 2 nights ago.  He has not had further bowel movement or flatus.  He denies nausea or vomiting.  He could not describe his pain clear but says "may be cramping".  His pain severity was 8/10 prior to coming.  Now pain about 6/10.  No radiation to his back or shoulders.  No alleviating or aggravating factor.  He has not received pain medications yet.  No prior history of SBO.  He denies fever or chills.  Reports chronic shortness of breath unchanged from baseline.  He says it is difficult to urinate lying in hospital bed but no dysuria prior to arrival.  He denies history of cardiac disease, lung disease or kidney disease.  He lives with his wife.  Denies smoking cigarettes, drinking alcohol recreational drug use.  He wishes to remain full code.  In ED, hemodynamically stable.  Significant labs include bicarb of 20 and platelet of 100.  UA with 20 ketones.  CT abdomen and pelvis revealed SBO with transition point of terminal ileum in the central abdomen, and moderate volume ascites thought to be reactive.  General surgery, Dr. Derrell Lolling consulted, and recommended NG tube and SBO protocol.  Nelson received IV fluid.  Hospitalist service called for admission.   ROS All review of system negative except for pertinent positives and negatives as history of present illness above.  PMH Past Medical History:  Diagnosis Date  . Anxiety   . Arthritis     . Bladder calculus   . BPH (benign prostatic hyperplasia)   . Chronic constipation   . Complication of anesthesia    " I had some coughing afterwards for a couple of days"--  per pt "perfers spinal anesthesia since general anesthesia congnitive issues when older"  . Diverticulosis of colon   . Dry eye syndrome of both eyes   . Environmental allergies   . GERD (gastroesophageal reflux disease)    occasional  . History of adenomatous polyp of colon    08/ 2004  . History of kidney stones   . History of squamous cell carcinoma in situ (SCCIS) of skin    s/p  excision 2013 facial areas and 06/ 2016 nose  . Migraine    eye migraine occasional  . Seasonal and perennial allergic rhinitis   . Thrombocytopenia (HCC)   . Tingling    hands and feet bilat , intermittantly-- per pt has lumbar bulging disk  . Urinary frequency   . Vocal fold atrophy    dysphonia-- per pt has to drink large amount of water to take even on pill  . Wears glasses    PSH Past Surgical History:  Procedure Laterality Date  . APPENDECTOMY  child  . CARDIOVASCULAR STRESS TEST  05-17-2015  dr hilty   Low risk nuclear study w/ no ischemia/  normal LV function and wall motion , stress ef 54% (lvef 45-54%)  . CHOLECYSTECTOMY N/A 09/29/2014   Procedure: LAPAROSCOPIC CHOLECYSTECTOMY WITH INTRAOPERATIVE CHOLANGIOGRAM;  Surgeon: Ovidio Kin, MD;  Location: [facility];  Service: General;  Laterality: N/A;  . COLONOSCOPY  last one 09-06-2010  . ESOPHAGOGASTRODUODENOSCOPY N/A 12/09/2013   Procedure: ESOPHAGOGASTRODUODENOSCOPY (EGD);  Surgeon: Willis Modena, MD;  Location: [facility];  Service: Endoscopy;  Laterality: N/A;  . EXTRACORPOREAL SHOCK WAVE LITHOTRIPSY  yrs ago  . INGUINAL HERNIA REPAIR Left child  . inguinal hernia repair  09/2017  . NISSEN FUNDOPLICATION  1980's   open  . STONE EXTRACTION WITH BASKET N/A 08/18/2016   Procedure: STONE EXTRACTION WITH BASKET;  Surgeon: Jethro Bolus, MD;  Location: [facility];  Service: Urology;  Laterality: N/A;  . THULIUM LASER TURP (TRANSURETHRAL RESECTION OF PROSTATE) N/A 08/18/2016   Procedure: THULIUM LASER BLADDER NECK INCISION AND BLADDER STONE REMOVAL;  Surgeon: Jethro Bolus, MD;  Location: [facility];  Service: Urology;  Laterality: N/A;  . TONSILLECTOMY  child  . TRANSTHORACIC ECHOCARDIOGRAM  11/18/2010   grade 1 diastolic dysfunction, ef 55-60%/  trivial MR and TR/ mild dilated RA   Fam HX Family History  Problem Relation Age of Onset  . Parkinsonism Brother   . COPD Mother   . Allergies Mother   . Heart failure Mother   . COPD Father   . Stroke Father   . Other Sister   . Suicidality Maternal Aunt   . Cancer Maternal Grandfather    Social Hx  reports that he has never smoked. He has never used smokeless tobacco. He reports current alcohol use. He reports that he does not use drugs.  Allergy Allergies  Allergen Reactions  . Ciprofloxacin     JOINT PAIN  . Flagyl [Metronidazole] Other (See Comments)    REACTION: no appetite, diarrhea after meal, decrease in weight  . Metoclopramide Hcl Other (See Comments)    REACTION: "involuntary movements"  . Other Other (See Comments)    Antibiotics have unknown reaction propophol causes memory problems  . Septra [Sulfamethoxazole-Trimethoprim] Other (See Comments)    REACTION: "involuntary movements" tripac antibiotic- heart rythm  . Silodosin     ? Possibly allergy, could not breathe well out of nose  . Soy Allergy Other (See Comments)    Stomach upset   Home Meds Prior to Admission medications   Medication Sig Start Date End Date Taking? Authorizing Provider  ALPRAZolam (XANAX) 0.25 MG tablet Take 0.125 mg by mouth 2 (two) times daily as needed for anxiety or sleep.    Yes [provider]  Cholecalciferol (VITAMIN D-3) 1000 units CAPS Take 2,000 Units by mouth daily.    Yes [provider]  Cranberry 400 MG CAPS Take 400-1,200 mg by  mouth every morning.    Yes [provider]  L-Tyrosine 500 MG CAPS Take 500 mg by mouth daily.    Yes [provider]  Menaquinone-7 (VITAMIN K2 PO) Take 120-240 mg by mouth daily. Combo w/ Vit. D   Yes [provider]  Propylene Glycol (SYSTANE BALANCE) 0.6 % SOLN Apply 1 drop to eye daily as needed (dry eyes).    Yes [provider]  sodium chloride (MURO 128) 5 % ophthalmic ointment Place 1 application into both eyes at bedtime.    Yes [provider]  UNABLE TO FIND Med Name: Acetyl-L-Carnitine   Yes [provider]  MAGNESIUM CITRATE PO Take 75 mg by mouth daily.    [provider]  Melatonin 2.5 MG CAPS Take 1.25 mg by mouth at bedtime. As needed    [provider]  meloxicam (MOBIC) 15 MG tablet Take 15 mg by mouth daily. 09/27/19   [provider]  Milk Thistle 175 MG CAPS Take 1 capsule by mouth daily.     [provider]  OVER THE COUNTER MEDICATION Take 3 capsules by mouth 2 (two) times daily. Lion's mane 0.5 gram/capsule    [provider]  OVER THE COUNTER MEDICATION Take 2 capsules by mouth every morning. Reparagen supplement    [provider]  OVER THE COUNTER MEDICATION Take 1 tablet by mouth daily as needed (for eye migraine). Mygra Few    [provider]  OVER THE COUNTER MEDICATION Sinus Clear Herb PO-- takes as needed    [provider]  Phosphatidylserine 100 MG CAPS Take 100 mg by mouth every morning.     [provider]  Probiotic Product (PROBIOTIC DAILY PO) Take by mouth.    [provider]  THEANINE PO Take 2 tablets by mouth 2 (two) times daily.     [provider]  Turmeric, Curcuma Longa, (CURCUMIN) POWD 220 mg by Does not apply route.    [provider]  UNABLE TO FIND Med Name: suntheanine 125 mg 2x3/day    [provider]  vitamin C (ASCORBIC ACID) 250 MG tablet Take 250 mg by mouth daily.     [provider]    Physical Exam: Vitals:   12/31/19 1645 12/31/19 1652 12/31/19 1715 12/31/19 1733  BP: (!) 143/60 (!) 143/60 (!) 131/55 (!) 141/66  Pulse: 64 73 (!) 57 60  Resp:  18  18  Temp:      TempSrc:      SpO2: 99% 99% 100% 100%  Weight:      Height:        GENERAL: Frail looking elderly male.  Some distress from pain HEENT: MMM.  Vision and hearing grossly intact.  NECK: Supple.  No apparent JVD.  RESP:  No IWOB. Good air movement bilaterally. CVS:  RRR. Heart sounds normal.  ABD/GI/GU: BS present.  Distended and tender.  MSK/EXT:  Moves extremities.  Significant muscle mass and subcu fat loss. SKIN: no apparent skin lesion or wound NEURO: Awake, alert and oriented appropriately.  No gross deficit.  PSYCH: Calm. Normal affect.   Personally Reviewed Radiological Exams CT Abdomen Pelvis W Contrast  Result Date: 12/31/2019 CLINICAL DATA:  Abdominal distension, concern for small bowel obstruction EXAM: CT ABDOMEN AND PELVIS WITH CONTRAST TECHNIQUE: Multidetector CT imaging of the abdomen and pelvis was performed using the standard protocol following bolus administration of intravenous contrast. CONTRAST:  100mL OMNIPAQUE IOHEXOL 300 MG/ML  SOLN COMPARISON:  11/11/2019 FINDINGS: Lower chest: No acute abnormality. Hepatobiliary: Fluid attenuation cyst of the left lobe of the liver. Additional subcentimeter lesions of the liver too small to characterize although very likely small cysts and unchanged compared to prior. Status post cholecystectomy. Postoperative biliary dilatation. Pancreas: Unremarkable. No pancreatic ductal dilatation or surrounding inflammatory changes. Spleen: Normal in size without significant abnormality. Adrenals/Urinary Tract: Adrenal glands are unremarkable. Kidneys are normal, without renal calculi, solid lesion, or hydronephrosis. Bladder is unremarkable. Stomach/Bowel: Stomach is within normal limits. The small bowel is diffusely air and fluid  distended, with a knuckled transition point of the terminal ileum in the central abdomen (series 2, image 55). There is scattered gas and stool throughout the colon to the rectum. Sigmoid diverticulosis. Vascular/Lymphatic: Aortic atherosclerosis. No enlarged abdominal or pelvic lymph nodes. Reproductive: No mass or other significant abnormality. Other: No abdominal  wall hernia or abnormality. Moderate volume ascites throughout the abdomen and pelvis. Musculoskeletal: No acute or significant osseous findings. IMPRESSION: 1. The small bowel is diffusely air and fluid distended, with a knuckled transition point of the terminal ileum in the central abdomen. There is is scattered gas and stool throughout the colon to the rectum. Findings are consistent with distal small bowel obstruction, which may be incomplete or developing given the presence of gas and stool in the colon. 2. Moderate volume ascites throughout the abdomen and pelvis, likely reactive. 3. Aortic Atherosclerosis (ICD10-I70.0). Electronically Signed   By: Lauralyn Primes M.D.   On: 12/31/2019 16:18     Personally Reviewed Labs: CBC: Recent Labs  Lab 12/31/19 1149  WBC 8.3  NEUTROABS 7.3  HGB 15.5  HCT 45.1  MCV 98.3  PLT 100*   Basic Metabolic Panel: Recent Labs  Lab 12/31/19 1149  NA 137  K 4.3  CL 102  CO2 21*  GLUCOSE 106*  BUN 18  CREATININE 0.56*  CALCIUM 9.3   GFR: Estimated Creatinine Clearance: 52.8 mL/min (A) (by C-G formula based on SCr of 0.56 mg/dL (L)). Liver Function Tests: Recent Labs  Lab 12/31/19 1149  AST 22  ALT 22  ALKPHOS 72  BILITOT 0.9  PROT 6.4*  ALBUMIN 4.0   Recent Labs  Lab 12/31/19 1149  LIPASE 26   No results for input(s): AMMONIA in the last 168 hours. Coagulation Profile: No results for input(s): INR, PROTIME in the last 168 hours. Cardiac Enzymes: No results for input(s): CKTOTAL, CKMB, CKMBINDEX, TROPONINI in the last 168 hours. BNP (last 3 results) No results for input(s):  PROBNP in the last 8760 hours. HbA1C: No results for input(s): HGBA1C in the last 72 hours. CBG: No results for input(s): GLUCAP in the last 168 hours. Lipid Profile: No results for input(s): CHOL, HDL, LDLCALC, TRIG, CHOLHDL, LDLDIRECT in the last 72 hours. Thyroid Function Tests: No results for input(s): TSH, T4TOTAL, FREET4, T3FREE, THYROIDAB in the last 72 hours. Anemia Panel: No results for input(s): VITAMINB12, FOLATE, FERRITIN, TIBC, IRON, RETICCTPCT in the last 72 hours. Urine analysis:    Component Value Date/Time   COLORURINE YELLOW 12/31/2019 1149   APPEARANCEUR CLEAR 12/31/2019 1149   LABSPEC 1.025 12/31/2019 1149   PHURINE 5.0 12/31/2019 1149   GLUCOSEU NEGATIVE 12/31/2019 1149   HGBUR NEGATIVE 12/31/2019 1149   BILIRUBINUR NEGATIVE 12/31/2019 1149   KETONESUR 20 (A) 12/31/2019 1149   PROTEINUR 30 (A) 12/31/2019 1149   UROBILINOGEN 0.2 11/20/2014 2159   NITRITE NEGATIVE 12/31/2019 1149   LEUKOCYTESUR NEGATIVE 12/31/2019 1149    Sepsis Labs:  None  Personally Reviewed EKG:  None  Assessment/Plan Small bowel obstruction-history of prior abdominal surgeries. CT abdomen and pelvis revealed SBO with transition point of terminal ileum in the central abdomen, and moderate volume ascites thought to be reactive.  No nausea or vomiting.  LBM and flatus two nights ago. -General surgery consulted by EDP recommended NGT and SBO protocol -IV LR at 75 cc an hour -IV fentanyl for pain control -SCD for VTE prophylaxis given SBO and thrombocytopenia -N.p.o.  Moderate abdominal ascites-thought to be reactive due to SBO. -Management as above   Dehydration in the setting of SBO. -IV fluid as above  Metabolic acidosis: Likely due to #1. -Continue monitoring  History of chronic constipation-followed by [facility], Dr. Dulce Sellar. -Bowel regimen once SBO resolves  History of anxiety-as needed Xanax on his medication list -As needed IV Ativan  Chronic pain/osteoarthritis -IV  fentanyl as above.  History of BPH with intermittent dysuria-does not seem to be on medication. -Monitor urine output.  Severe malnutrition/underweight: BMI 17.23.  Likely due to chronic constipation -Consult dietitian once SBO resolves.  DVT prophylaxis: SCD in the setting of SBO and thrombocytopenia Code Status: Full code Family Communication: Attempted to call Nelson's wife.  Work phone not working.  Home phone went to voicemail.  Updated Nelson's son over the phone. Disposition Plan: Admit to MedSurg Consults called: General surgery by EDP Admission status: Inpatient   Almon Hercules MD [facility]  If 7PM-7AM, please contact night-coverage [email or web address]  12/31/2019, 5:38 PM

## 2019-12-31 NOTE — ED Provider Notes (Signed)
[facility]-EMERGENCY DEPT Provider Note   CSN: [id number] Arrival date & time: 12/31/19  1025     History Chief Complaint  Patient presents with  . Abdominal Distention    Lee Nelson is a 79 y.o. male with history of chronic constipation, BPH presents to ER for evaluation of lower abdominal pain associated with lower abdominal bloating, nausea, difficulty having bowel movement and no passing gas since yesterday evening. Took a stool softener yesterday and had a soft but small BM yesterday evening.  Has not passed gas or had a BM since. Symptoms gradual, worsening. No fever, vomiting. Reports intermittent dysuria for a long time, no significant changes in urine output. Reports history of multiple abdominal surgeries including cholecystectomy, appendectomy, Nissen fundoplication.  Sees Dr Dulce Sellar for chronic constipation issues, vaguely remembers last colonoscopy normal with maybe some polyps.  No history of SBO.   HPI     Past Medical History:  Diagnosis Date  . Anxiety   . Arthritis   . Bladder calculus   . BPH (benign prostatic hyperplasia)   . Chronic constipation   . Complication of anesthesia    " I had some coughing afterwards for a couple of days"--  per pt "perfers spinal anesthesia since general anesthesia congnitive issues when older"  . Diverticulosis of colon   . Dry eye syndrome of both eyes   . Environmental allergies   . GERD (gastroesophageal reflux disease)    occasional  . History of adenomatous polyp of colon    08/ 2004  . History of kidney stones   . History of squamous cell carcinoma in situ (SCCIS) of skin    s/p  excision 2013 facial areas and 06/ 2016 nose  . Migraine    eye migraine occasional  . Seasonal and perennial allergic rhinitis   . Thrombocytopenia (HCC)   . Tingling    hands and feet bilat , intermittantly-- per pt has lumbar bulging disk  . Urinary frequency   . Vocal fold atrophy    dysphonia-- per pt has to  drink large amount of water to take even on pill  . Wears glasses     Patient Active Problem List   Diagnosis Date Noted  . Amaurosis fugax of right eye 08/25/2017  . PSVT (paroxysmal supraventricular tachycardia) (HCC) 09/12/2015  . Chest pain 05/03/2015  . Dyspnea 05/03/2015  . ALLERGIC RHINITIS 09/21/2007  . G E R D 09/21/2007  . SMOKE INHALATION 09/21/2007  . OSTEOPOROSIS 01/28/2007  . PALPITATIONS 01/28/2007  . COUGH 01/28/2007  . ALLERGY 01/28/2007    Past Surgical History:  Procedure Laterality Date  . APPENDECTOMY  child  . CARDIOVASCULAR STRESS TEST  05-17-2015  dr hilty   Low risk nuclear study w/ no ischemia/  normal LV function and wall motion , stress ef 54% (lvef 45-54%)  . CHOLECYSTECTOMY N/A 09/29/2014   Procedure: LAPAROSCOPIC CHOLECYSTECTOMY WITH INTRAOPERATIVE CHOLANGIOGRAM;  Surgeon: Ovidio Kin, MD;  Location: [facility];  Service: General;  Laterality: N/A;  . COLONOSCOPY  last one 09-06-2010  . ESOPHAGOGASTRODUODENOSCOPY N/A 12/09/2013   Procedure: ESOPHAGOGASTRODUODENOSCOPY (EGD);  Surgeon: Willis Modena, MD;  Location: [facility];  Service: Endoscopy;  Laterality: N/A;  . EXTRACORPOREAL SHOCK WAVE LITHOTRIPSY  yrs ago  . INGUINAL HERNIA REPAIR Left child  . inguinal hernia repair  09/2017  . NISSEN FUNDOPLICATION  1980's   open  . STONE EXTRACTION WITH BASKET N/A 08/18/2016   Procedure: STONE EXTRACTION WITH BASKET;  Surgeon: Jethro Bolus, MD;  Location: [facility];  Service: Urology;  Laterality: N/A;  . THULIUM LASER TURP (TRANSURETHRAL RESECTION OF PROSTATE) N/A 08/18/2016   Procedure: THULIUM LASER BLADDER NECK INCISION AND BLADDER STONE REMOVAL;  Surgeon: Jethro Bolusannenbaum, Sigmund, MD;  [facility];  Service: Urology;  Laterality: N/A;  . TONSILLECTOMY  child  . TRANSTHORACIC ECHOCARDIOGRAM  11/18/2010   grade 1 diastolic dysfunction, ef 55-60%/  trivial MR and TR/ mild dilated RA       Family History    Problem Relation Age of Onset  . Parkinsonism Brother   . COPD Mother   . Allergies Mother   . Heart failure Mother   . COPD Father   . Stroke Father   . Other Sister   . Suicidality Maternal Aunt   . Cancer Maternal Grandfather     Social History   Tobacco Use  . Smoking status: Never Smoker  . Smokeless tobacco: Never Used  Substance Use Topics  . Alcohol use: Yes    Comment: social  . Drug use: No    Home Medications Prior to Admission medications   Medication Sig Start Date End Date Taking? Authorizing Provider  UNABLE TO FIND Med Name: Acetyl-L-Carnitine   Yes [provider]  ALPRAZolam (XANAX) 0.25 MG tablet Take 0.125 mg by mouth 2 (two) times daily as needed for sleep.     [provider]  Cholecalciferol (VITAMIN D-3) 1000 units CAPS Take 2,000 Units by mouth daily.     [provider]  Cranberry 400 MG CAPS Take 400-1,200 mg by mouth every morning.     [provider]  dorzolamide-timolol (COSOPT) 22.3-6.8 MG/ML ophthalmic solution 1 drop 2 (two) times daily. 09/20/19   [provider]  L-Tyrosine 500 MG CAPS Take by mouth daily.    [provider]  MAGNESIUM CITRATE PO Take 75 mg by mouth daily.    [provider]  Melatonin 2.5 MG CAPS Take 1.25 mg by mouth at bedtime. As needed    [provider]  meloxicam (MOBIC) 15 MG tablet Take 15 mg by mouth daily. 09/27/19   [provider]  Menaquinone-7 (VITAMIN K2 PO) Take 120-240 mg by mouth daily. Combo w/ Vit. D    [provider]  Milk Thistle 175 MG CAPS Take 1 capsule by mouth daily.     [provider]  OVER THE COUNTER MEDICATION Take 3 capsules by mouth 2 (two) times daily. Lion's mane 0.5 gram/capsule    [provider]  OVER THE COUNTER MEDICATION Take 2 capsules by mouth every morning. Reparagen supplement    [provider]  OVER THE COUNTER MEDICATION Take 1 tablet by mouth daily as needed  (for eye migraine). Mygra Few    [provider]  OVER THE COUNTER MEDICATION Sinus Clear Herb PO-- takes as needed    [provider]  Phosphatidylserine 100 MG CAPS Take 100 mg by mouth every morning.     [provider]  Probiotic Product (PROBIOTIC DAILY PO) Take by mouth.    [provider]  Propylene Glycol (SYSTANE BALANCE) 0.6 % SOLN 1 drop.    [provider]  sodium chloride (MURO 128) 5 % ophthalmic ointment Place 1 application into both eyes 2 (two) times daily.     [provider]  THEANINE PO Take 2 tablets by mouth 2 (two) times daily.     [provider]  Turmeric, Curcuma Longa, (CURCUMIN) POWD 220 mg by Does  not apply route.    [provider]  UNABLE TO FIND Med Name: suntheanine 125 mg 2x3/day    [provider]  vitamin C (ASCORBIC ACID) 250 MG tablet Take 250 mg by mouth daily.    [provider]    Allergies    Ciprofloxacin, Flagyl [metronidazole], Metoclopramide hcl, Other, Septra [sulfamethoxazole-trimethoprim], and Silodosin  Review of Systems   Review of Systems  Gastrointestinal: Positive for abdominal distention, abdominal pain and nausea.  All other systems reviewed and are negative.   Physical Exam Updated Vital Signs BP 139/73   Pulse 62   Temp 98.2 F (36.8 C) (Oral)   Resp 18   Ht 5\' 7"  (1.702 m)   Wt 49.9 kg   SpO2 100%   BMI 17.23 kg/m   Physical Exam Vitals and nursing note reviewed.  Constitutional:      Appearance: He is well-developed.     Comments: Non toxic.  HENT:     Head: Normocephalic and atraumatic.     Nose: Nose normal.  Eyes:     Conjunctiva/sclera: Conjunctivae normal.  Cardiovascular:     Rate and Rhythm: Normal rate and regular rhythm.     Heart sounds: Normal heart sounds.  Pulmonary:     Effort: Pulmonary effort is normal.     Breath sounds: Normal breath sounds.  Abdominal:     General: Bowel sounds are normal. There is  distension.     Tenderness: There is abdominal tenderness.     Comments: Lower abdominal distention with mild firmness, tenderness mostly right sided. +Tympanic. Active BS to right abdomen, none on the left. Large abdominal well healed scar. No suprapubic or CVA tenderness.   Musculoskeletal:        General: Normal range of motion.     Cervical back: Normal range of motion.  Skin:    General: Skin is warm and dry.     Capillary Refill: Capillary refill takes less than 2 seconds.  Neurological:     Mental Status: He is alert.  Psychiatric:        Behavior: Behavior normal.     ED Results / Procedures / Treatments   Labs (all labs ordered are listed, but only abnormal results are displayed) Labs Reviewed  CBC WITH DIFFERENTIAL/PLATELET - Abnormal; Notable for the following components:      Result Value   Platelets 100 (*)    Lymphs Abs 0.6 (*)    All other components within normal limits  COMPREHENSIVE METABOLIC PANEL - Abnormal; Notable for the following components:   CO2 21 (*)    Glucose, Bld 106 (*)    Creatinine, Ser 0.56 (*)    Total Protein 6.4 (*)    All other components within normal limits  URINALYSIS, ROUTINE W REFLEX MICROSCOPIC - Abnormal; Notable for the following components:   Ketones, ur 20 (*)    Protein, ur 30 (*)    All other components within normal limits  LIPASE, BLOOD    EKG None  Radiology No results found.  Procedures Procedures (including critical care time)  Medications Ordered in ED Medications  ondansetron (ZOFRAN) injection 4 mg (4 mg Intravenous Refused 12/31/19 1309)  iohexol (OMNIPAQUE) 300 MG/ML solution 100 mL (has no administration in time range)  0.9 %  sodium chloride infusion ( Intravenous New Bag/Given 12/31/19 1309)    ED Course  I have reviewed the triage vital signs and the nursing notes.  Pertinent labs & imaging results that were available during  my care of the patient were reviewed by me and considered in my medical  decision making (see chart for details).  Clinical Course as of Dec 30 1508  Sat Dec 31, 2019  1448 Called CT x 3 no pick up. Walked to CT and techs not there, asked x-ray student there to please pass message along to scan this patient next. Updated patient and apologized for delay in imagining.    [CG]    Clinical Course User Index [CG] Jerrell Mylar   MDM Rules/Calculators/A&P                          80 year old male presents for evaluation of lower abdominal pain, distention, nausea, difficulty having a bowel movement and passing gas since last night.  History of multiple abdominal surgeries.  Patient's EMR, triage nursing notes reviewed to assist with history and MDM.  Last EGD 2015 was unremarkable.  No available colonoscopies.  Sees Dr. Dulce Sellar for constipation.  Had CT A/P on 8/13 showed anal rectal underdistention with wall thickening, colonic stool burden suggesting constipation but no obstruction.  Recommended colon cancer screening to exclude anorectal mass.  DDx includes constipation related pain versus SBO/ileus versus colon mass or malignancy given last scan.  Has history of BPH s/p TURP and denies any significant urinary output changes, GU process like a retention, UTI considered less likely.  S/p appendectomy, cholecystectomy.  I have ordered screening labs, CT A/P.  Patient offered pain medicine but he declined.  Antiemetic ordered.  We will plan for close monitoring, repeat exam.  1508: Delay in CT scan, unable to contact CT techs for update.  Patient reevaluated and reports mild but persistent lower abdominal discomfort, is still distended.  Has urinated here.  ER work-up personally visualized and interpreted.  Urinalysis without signs of infection.  No leukocytosis.  Electrolytes, LFTs, creatinine normal.  Handed off to oncoming ED PA who will follow up on CT A/P, update patient and determine disposition. Final Clinical Impression(s) / ED  Diagnoses Final diagnoses:  Lower abdominal pain    Rx / DC Orders ED Discharge Orders    None       Liberty Handy, PA-C 12/31/19 1510    Rolan Bucco, MD 01/01/20 [phone]

## 2019-12-31 NOTE — Progress Notes (Signed)
Pt with SBO on CT and s/f PSHx. Pt with no n/v/ at this time, but d/y CT findings would recommend NGT placement and SBO protocol. SBO protocol ordered Will f/u films. Full consult to follow.

## 2019-12-31 NOTE — ED Triage Notes (Signed)
Pt presents concerned that he may have a bowel blockage. Pt reports that he has not passed gas since yesterday evening around 6pm. Pt does report taking a stool softener and had a small bowel movement yesterday afternoon. Pt's abdomen is distended and firm.

## 2019-12-31 NOTE — ED Notes (Signed)
Attempt placement of 62F NG in left nare x2 and in right nare x1.  Met resistance in nose and was unable to advance.  No smaller NG in stock in ED.  Will notify floor RN Marylin Crosby and MD.

## 2019-12-31 NOTE — ED Notes (Signed)
Call 3W

## 2019-12-31 NOTE — ED Notes (Signed)
Report given to Leann on 3W

## 2019-12-31 NOTE — ED Provider Notes (Signed)
  Physical Exam  BP 133/68 (BP Location: Right Arm)   Pulse (!) 54   Temp 98.2 F (36.8 C) (Oral)   Resp 18   Ht 5\' 7"  (1.702 m)   Wt 49.9 kg   SpO2 100%   BMI 17.23 kg/m   Physical Exam  Gen: nontoxic Abd: firm and distended Cv: rrr Pulm: no respiratory distress  ED Course/Procedures   Clinical Course as of Dec 31 1519  Sat Dec 31, 2019  1448 Called CT x 3 no pick up. Walked to CT and techs not there, asked x-ray student there to please pass message along to scan this patient next. Updated patient and apologized for delay in imagining.    [CG]    Clinical Course User Index [CG] Jan 02, 2020, PA-C    Procedures  MDM   Patient signed out to me by Liberty Handy, PA-C.  Please see previous notes for further history.  Brief, patient presenting for evaluation of abdominal pain, distention, nausea.  Patient had a small bowel movement last night, but states he has been unable to pass gas since.  He follows with Dr. Janene Madeira from GI. On exam, pt with abd firmness and distention. No vomiting in the ED. Recent CT showed concerns for possible colonic mass, pt did not f/u for colonoscopy.  Labs overall reassuring, CT pending.  Patient will likely need admission.  CT consistent with early distal small bowel obstruction.  Will consult with general surgery and admit to medicine.  Discussed with Dr. Dulce Sellar in general surgery, requested NG tube.  His stable consult.  Discussed with Dr. Derrell Lolling from [facility]ad hospitalist service, patient to be admitted.    Alanda Slim, PA-C 12/31/19 1708    03/01/20, MD 12/31/19 1722

## 2019-12-31 NOTE — ED Notes (Signed)
MD is here to see pt

## 2020-01-01 ENCOUNTER — Inpatient Hospital Stay (HOSPITAL_COMMUNITY): Payer: Medicare Other

## 2020-01-01 DIAGNOSIS — D693 Immune thrombocytopenic purpura: Secondary | ICD-10-CM

## 2020-01-01 LAB — CBC
HCT: 41.8 % (ref 39.0–52.0)
Hemoglobin: 14.3 g/dL (ref 13.0–17.0)
MCH: 33.5 pg (ref 26.0–34.0)
MCHC: 34.2 g/dL (ref 30.0–36.0)
MCV: 97.9 fL (ref 80.0–100.0)
Platelets: 144 10*3/uL — ABNORMAL LOW (ref 150–400)
RBC: 4.27 MIL/uL (ref 4.22–5.81)
RDW: 13.3 % (ref 11.5–15.5)
WBC: 9.4 10*3/uL (ref 4.0–10.5)
nRBC: 0 % (ref 0.0–0.2)

## 2020-01-01 LAB — COMPREHENSIVE METABOLIC PANEL
ALT: 16 U/L (ref 0–44)
AST: 19 U/L (ref 15–41)
Albumin: 3.4 g/dL — ABNORMAL LOW (ref 3.5–5.0)
Alkaline Phosphatase: 57 U/L (ref 38–126)
Anion gap: 9 (ref 5–15)
BUN: 19 mg/dL (ref 8–23)
CO2: 25 mmol/L (ref 22–32)
Calcium: 8.7 mg/dL — ABNORMAL LOW (ref 8.9–10.3)
Chloride: 104 mmol/L (ref 98–111)
Creatinine, Ser: 0.66 mg/dL (ref 0.61–1.24)
GFR calc Af Amer: >60 mL/min (ref >60)
GFR calc non Af Amer: >60 mL/min (ref >60)
Glucose, Bld: 119 mg/dL — ABNORMAL HIGH (ref 70–99)
Potassium: 4.2 mmol/L (ref 3.5–5.1)
Sodium: 138 mmol/L (ref 135–145)
Total Bilirubin: 0.8 mg/dL (ref 0.3–1.2)
Total Protein: 5.4 g/dL — ABNORMAL LOW (ref 6.5–8.1)

## 2020-01-01 LAB — PROTIME-INR
INR: 1.1 (ref 0.8–1.2)
Prothrombin Time: 13.7 seconds (ref 11.4–15.2)

## 2020-01-01 MED ORDER — LACTATED RINGERS IV BOLUS: 1000.0000 mL | Freq: Three times a day (TID) | INTRAVENOUS | Status: AC | PRN

## 2020-01-01 MED ORDER — PROCHLORPERAZINE EDISYLATE 10 MG/2ML IJ SOLN: 5.0000 mg | INTRAMUSCULAR | Status: DC | PRN

## 2020-01-01 MED ORDER — MAGIC MOUTHWASH
15.0000 mL | Freq: Four times a day (QID) | ORAL | Status: DC | PRN
  Filled 2020-01-01: qty 15

## 2020-01-01 MED ORDER — SODIUM CHLORIDE 0.9 % IV SOLN
8.0000 mg | Freq: Four times a day (QID) | INTRAVENOUS | Status: DC | PRN
  Filled 2020-01-01: qty 4

## 2020-01-01 MED ORDER — BISACODYL 10 MG RE SUPP
10.0000 mg | Freq: Every day | RECTAL | Status: DC
  Administered 2020-01-01 – 2020-01-02 (×2): 10 mg via RECTAL
  Filled 2020-01-01 (×3): qty 1

## 2020-01-01 MED ORDER — PHENOL 1.4 % MT LIQD: 2.0000 | OROMUCOSAL | Status: DC | PRN

## 2020-01-01 MED ORDER — ALUM & MAG HYDROXIDE-SIMETH 200-200-20 MG/5ML PO SUSP: 30.0000 mL | Freq: Four times a day (QID) | ORAL | Status: DC | PRN

## 2020-01-01 MED ORDER — ONDANSETRON HCL 4 MG/2ML IJ SOLN: 4.0000 mg | Freq: Four times a day (QID) | INTRAMUSCULAR | Status: DC | PRN

## 2020-01-01 MED ORDER — MENTHOL 3 MG MT LOZG: 1.0000 | LOZENGE | OROMUCOSAL | Status: DC | PRN

## 2020-01-01 MED ORDER — SODIUM CHLORIDE 0.9 % IV SOLN
25.0000 mg | Freq: Four times a day (QID) | INTRAVENOUS | Status: DC | PRN
  Filled 2020-01-01: qty 1

## 2020-01-01 MED ORDER — METHOCARBAMOL 1000 MG/10ML IJ SOLN
1000.0000 mg | Freq: Four times a day (QID) | INTRAVENOUS | Status: DC | PRN
  Filled 2020-01-01: qty 10

## 2020-01-01 MED ORDER — LIP MEDEX EX OINT
1.0000 "application " | TOPICAL_OINTMENT | Freq: Two times a day (BID) | CUTANEOUS | Status: DC
  Administered 2020-01-01 – 2020-01-04 (×5): 1 via TOPICAL
  Filled 2020-01-01 (×2): qty 7

## 2020-01-01 NOTE — Progress Notes (Signed)
Pt stated that NG tube felt like it was choking him while in hallway ambulating.  Nurse noted NG tube to be out of place.  NG tube had to be removed.  Pt now having flatus. Dr. Michaell Cowing notified.  May leave NG tube out and monitor pt.  Pt only allowed to have ice chips and control nausea aggressively.  If pt has increase in pain or N/V reinsert NG tube.

## 2020-01-01 NOTE — Consult Note (Signed)
Reason for Consult: Small bowel obstruction Referring Physician: Dr. Herbert Punook  Lee Nelson CarmelK Lee Nelson is an 79 y.o. male.  HPI: Patient is a 79 year old male with a lengthy medical and surgical history.  Patient comes in with a 1- 1/2-day history of abdominal bloating, nausea, vomiting.  Patient states his last bowel movement was Friday night.  He states this was normal.  Patient states that this continued to increase and presented to the ER.  Upon evaluation the ER patient underwent CT scan which was consistent with distal SBO.  Patient was started on SBO protocol.  Of note patient states he had a previous open Nissen fundoplication 80s, cholecystectomy, appendectomy, and most recently hernia repair approximately 5 years ago.  Past Medical History:  Diagnosis Date  . Anxiety   . Arthritis   . Bladder calculus   . BPH (benign prostatic hyperplasia)   . Chronic constipation   . Complication of anesthesia    " I had some coughing afterwards for a couple of days"--  per pt "perfers spinal anesthesia since general anesthesia congnitive issues when older"  . Diverticulosis of colon   . Dry eye syndrome of both eyes   . Environmental allergies   . GERD (gastroesophageal reflux disease)    occasional  . History of adenomatous polyp of colon    08/ 2004  . History of kidney stones   . History of squamous cell carcinoma in situ (SCCIS) of skin    s/p  excision 2013 facial areas and 06/ 2016 nose  . Migraine    eye migraine occasional  . Seasonal and perennial allergic rhinitis   . Thrombocytopenia (HCC)   . Tingling    hands and feet bilat , intermittantly-- per pt has lumbar bulging disk  . Urinary frequency   . Vocal fold atrophy    dysphonia-- per pt has to drink large amount of water to take even on pill  . Wears glasses     Past Surgical History:  Procedure Laterality Date  . APPENDECTOMY  child  . CARDIOVASCULAR STRESS TEST  05-17-2015  dr hilty   Low risk nuclear study w/ no  ischemia/  normal LV function and wall motion , stress ef 54% (lvef 45-54%)  . CHOLECYSTECTOMY N/A 09/29/2014   Procedure: LAPAROSCOPIC CHOLECYSTECTOMY WITH INTRAOPERATIVE CHOLANGIOGRAM;  Surgeon: Ovidio Kinavid Newman, MD;  Location: [facility];  Service: General;  Laterality: N/A;  . COLONOSCOPY  last one 09-06-2010  . ESOPHAGOGASTRODUODENOSCOPY N/A 12/09/2013   Procedure: ESOPHAGOGASTRODUODENOSCOPY (EGD);  Surgeon: Willis ModenaWilliam Outlaw, MD;  [facility];  Service: Endoscopy;  Laterality: N/A;  . EXTRACORPOREAL SHOCK WAVE LITHOTRIPSY  yrs ago  . INGUINAL HERNIA REPAIR Left child  . inguinal hernia repair  09/2017  . NISSEN FUNDOPLICATION  1980's   open  . STONE EXTRACTION WITH BASKET N/A 08/18/2016   Procedure: STONE EXTRACTION WITH BASKET;  Surgeon: Jethro Bolusannenbaum, Sigmund, MD;  [facility] [place];  Service: Urology;  Laterality: N/A;  . THULIUM LASER TURP (TRANSURETHRAL RESECTION OF PROSTATE) N/A 08/18/2016   Procedure: THULIUM LASER BLADDER NECK INCISION AND BLADDER STONE REMOVAL;  Surgeon: Jethro Bolusannenbaum, Sigmund, MD;  [facility] [place];  Service: Urology;  Laterality: N/A;  . TONSILLECTOMY  child  . TRANSTHORACIC ECHOCARDIOGRAM  11/18/2010   grade 1 diastolic dysfunction, ef 55-60%/  trivial MR and TR/ mild dilated RA    Family History  Problem Relation Age of Onset  . Parkinsonism Brother   . COPD Mother   . Allergies Mother   .  Heart failure Mother   . COPD Father   . Stroke Father   . Other Sister   . Suicidality Maternal Aunt   . Cancer Maternal Grandfather     Social History:  reports that he has never smoked. He has never used smokeless tobacco. He reports current alcohol use. He reports that he does not use drugs.  Allergies:  Allergies  Allergen Reactions  . Ciprofloxacin     JOINT PAIN  . Flagyl [Metronidazole] Other (See Comments)    REACTION: no appetite, diarrhea after meal, decrease in weight  . Metoclopramide Hcl Other (See Comments)     REACTION: "involuntary movements"  . Other Other (See Comments)    Antibiotics have unknown reaction propophol causes memory problems  . Septra [Sulfamethoxazole-Trimethoprim] Other (See Comments)    REACTION: "involuntary movements" tripac antibiotic- heart rythm  . Silodosin     ? Possibly allergy, could not breathe well out of nose  . Soy Allergy Other (See Comments)    Stomach upset    Medications: I have reviewed the patient's current medications.  Results for orders placed or performed during the hospital encounter of 12/31/19 (from the past 48 hour(s))  CBC with Differential     Status: Abnormal   Collection Time: 12/31/19 11:49 AM  Result Value Ref Range   WBC 8.3 4.0 - 10.5 K/uL   RBC 4.59 4.22 - 5.81 MIL/uL   Hemoglobin 15.5 13.0 - 17.0 g/dL   HCT 17.7 39 - 52 %   MCV 98.3 80.0 - 100.0 fL   MCH 33.8 26.0 - 34.0 pg   MCHC 34.4 30.0 - 36.0 g/dL   RDW 93.9 03.0 - 09.2 %   Platelets 100 (L) 150 - 400 K/uL    Comment: PLATELET COUNT CONFIRMED BY SMEAR SPECIMEN CHECKED FOR CLOTS Immature Platelet Fraction may be clinically indicated, consider ordering this additional test [id number]    nRBC 0.0 0.0 - 0.2 %   Neutrophils Relative % 90 %   Neutro Abs 7.3 1.7 - 7.7 K/uL   Lymphocytes Relative 7 %   Lymphs Abs 0.6 (L) 0.7 - 4.0 K/uL   Monocytes Relative 3 %   Monocytes Absolute 0.3 0 - 1 K/uL   Eosinophils Relative 0 %   Eosinophils Absolute 0.0 0 - 0 K/uL   Basophils Relative 0 %   Basophils Absolute 0.0 0 - 0 K/uL   Immature Granulocytes 0 %   Abs Immature Granulocytes 0.02 0.00 - 0.07 K/uL    Comment: Performed at [facility], [address] [place], [place] [address]  Comprehensive metabolic panel     Status: Abnormal   Collection Time: 12/31/19 11:49 AM  Result Value Ref Range   Sodium 137 135 - 145 mmol/L   Potassium 4.3 3.5 - 5.1 mmol/L   Chloride 102 98 - 111 mmol/L   CO2 21 (L) 22 - 32 mmol/L   Glucose, Bld 106 (H) 70 - 99 mg/dL     Comment: Glucose reference range applies only to samples taken after fasting for at least 8 hours.   BUN 18 8 - 23 mg/dL   Creatinine, Ser 4.56 (L) 0.61 - 1.24 mg/dL   Calcium 9.3 8.9 - 25.6 mg/dL   Total Protein 6.4 (L) 6.5 - 8.1 g/dL   Albumin 4.0 3.5 - 5.0 g/dL   AST 22 15 - 41 U/L   ALT 22 0 - 44 U/L   Alkaline Phosphatase 72 38 - 126 U/L   Total  Bilirubin 0.9 0.3 - 1.2 mg/dL   GFR calc non Af Amer >60 >60 mL/min   GFR calc Af Amer >60 >60 mL/min   Anion gap 14 5 - 15    Comment: Performed at [facility], [address] [place], [place] [address]  Lipase, blood     Status: None   Collection Time: 12/31/19 11:49 AM  Result Value Ref Range   Lipase 26 11 - 51 U/L    Comment: Performed at [facility], [address] [place], [place] [address]  Urinalysis, Routine w reflex microscopic     Status: Abnormal   Collection Time: 12/31/19 11:49 AM  Result Value Ref Range   Color, Urine YELLOW YELLOW   APPearance CLEAR CLEAR   Specific Gravity, Urine 1.025 1.005 - 1.030   pH 5.0 5.0 - 8.0   Glucose, UA NEGATIVE NEGATIVE mg/dL   Hgb urine dipstick NEGATIVE NEGATIVE   Bilirubin Urine NEGATIVE NEGATIVE   Ketones, ur 20 (A) NEGATIVE mg/dL   Protein, ur 30 (A) NEGATIVE mg/dL   Nitrite NEGATIVE NEGATIVE   Leukocytes,Ua NEGATIVE NEGATIVE   RBC / HPF 0-5 0 - 5 RBC/hpf   WBC, UA 0-5 0 - 5 WBC/hpf   Bacteria, UA NONE SEEN NONE SEEN   Mucus PRESENT     Comment: Performed at [facility], [address] [place], [place] [address]  Respiratory Panel by RT PCR (Flu A&B, Covid) - Nasopharyngeal Swab     Status: None   Collection Time: 12/31/19  4:40 PM   Specimen: Nasopharyngeal Swab  Result Value Ref Range   SARS Coronavirus 2 by RT PCR NEGATIVE NEGATIVE    Comment: (NOTE) SARS-CoV-2 target nucleic acids are NOT DETECTED.  The SARS-CoV-2 RNA is generally detectable in upper respiratoy specimens during the acute phase of  infection. The lowest concentration of SARS-CoV-2 viral copies this assay can detect is 131 copies/mL. A negative result does not preclude SARS-Cov-2 infection and should not be used as the sole basis for treatment or other patient management decisions. A negative result may occur with  improper specimen collection/handling, submission of specimen other than nasopharyngeal swab, presence of viral mutation(s) within the areas targeted by this assay, and inadequate number of viral copies (<131 copies/mL). A negative result must be combined with clinical observations, patient history, and epidemiological information. The expected result is Negative.  Fact Sheet for Patients:  https://www.moore.com/  Fact Sheet for Healthcare Providers:  https://www.young.biz/  This test is no t yet approved or cleared by the [place] FDA and  has been authorized for detection and/or diagnosis of SARS-CoV-2 by FDA under an Emergency Use Authorization (EUA). This EUA will remain  in effect (meaning this test can be used) for the duration of the COVID-19 declaration under Section 564(b)(1) of the Act, 21 U.S.C. section 360bbb-3(b)(1), unless the authorization is terminated or revoked sooner.     Influenza A by PCR NEGATIVE NEGATIVE   Influenza B by PCR NEGATIVE NEGATIVE    Comment: (NOTE) The Xpert Xpress SARS-CoV-2/FLU/RSV assay is intended as an aid in  the diagnosis of influenza from Nasopharyngeal swab specimens and  should not be used as a sole basis for treatment. Nasal washings and  aspirates are unacceptable for Xpert Xpress SARS-CoV-2/FLU/RSV  testing.  Fact Sheet for Patients: https://www.moore.com/  Fact Sheet for Healthcare Providers: https://www.young.biz/  This test is not yet approved or cleared by the [place] FDA and  has been authorized for detection  and/or diagnosis of SARS-CoV-2 by  FDA  under an Emergency Use Authorization (EUA). This EUA will remain  in effect (meaning this test can be used) for the duration of the  Covid-19 declaration under Section 564(b)(1) of the Act, 21  U.S.C. section 360bbb-3(b)(1), unless the authorization is  terminated or revoked. Performed at [facility], [address], [place], [place] [address]   Comprehensive metabolic panel     Status: Abnormal   Collection Time: 01/01/20  2:53 AM  Result Value Ref Range   Sodium 138 135 - 145 mmol/L   Potassium 4.2 3.5 - 5.1 mmol/L   Chloride 104 98 - 111 mmol/L   CO2 25 22 - 32 mmol/L   Glucose, Bld 119 (H) 70 - 99 mg/dL    Comment: Glucose reference range applies only to samples taken after fasting for at least 8 hours.   BUN 19 8 - 23 mg/dL   Creatinine, Ser 4.19 0.61 - 1.24 mg/dL   Calcium 8.7 (L) 8.9 - 10.3 mg/dL   Total Protein 5.4 (L) 6.5 - 8.1 g/dL   Albumin 3.4 (L) 3.5 - 5.0 g/dL   AST 19 15 - 41 U/L   ALT 16 0 - 44 U/L   Alkaline Phosphatase 57 38 - 126 U/L   Total Bilirubin 0.8 0.3 - 1.2 mg/dL   GFR calc non Af Amer >60 >60 mL/min   GFR calc Af Amer >60 >60 mL/min   Anion gap 9 5 - 15    Comment: Performed at [facility], [address], [place], [place] [address]  CBC     Status: Abnormal   Collection Time: 01/01/20  2:53 AM  Result Value Ref Range   WBC 9.4 4.0 - 10.5 K/uL   RBC 4.27 4.22 - 5.81 MIL/uL   Hemoglobin 14.3 13.0 - 17.0 g/dL   HCT 79.8 39 - 52 %   MCV 97.9 80.0 - 100.0 fL   MCH 33.5 26.0 - 34.0 pg   MCHC 34.2 30.0 - 36.0 g/dL   RDW 92.1 19.4 - 17.4 %   Platelets 144 (L) 150 - 400 K/uL    Comment: REPEATED TO VERIFY   nRBC 0.0 0.0 - 0.2 %    Comment: Performed at [facility], [address], [place], [place] [address]  Protime-INR     Status: None   Collection Time: 01/01/20  2:53 AM  Result Value Ref Range   Prothrombin Time 13.7 11.4 - 15.2 seconds   INR 1.1 0.8 - 1.2    Comment: (NOTE) INR goal  varies based on device and disease states. Performed at [facility], [address], [place], [place] [address]     CT Abdomen Pelvis W Contrast  Result Date: 12/31/2019 CLINICAL DATA:  Abdominal distension, concern for small bowel obstruction EXAM: CT ABDOMEN AND PELVIS WITH CONTRAST TECHNIQUE: Multidetector CT imaging of the abdomen and pelvis was performed using the standard protocol following bolus administration of intravenous contrast. CONTRAST:  OMNIPAQUE IOHEXOL 300 MG/ML  SOLN COMPARISON:  11/11/2019 FINDINGS: Lower chest: No acute abnormality. Hepatobiliary: Fluid attenuation cyst of the left lobe of the liver. Additional subcentimeter lesions of the liver too small to characterize although very likely small cysts and unchanged compared to prior. Status post cholecystectomy. Postoperative biliary dilatation. Pancreas: Unremarkable. No pancreatic ductal dilatation or surrounding inflammatory changes. Spleen: Normal in size without significant abnormality. Adrenals/Urinary Tract: Adrenal glands are unremarkable. Kidneys are normal, without renal  calculi, solid lesion, or hydronephrosis. Bladder is unremarkable. Stomach/Bowel: Stomach is within normal limits. The small bowel is diffusely air and fluid distended, with a knuckled transition point of the terminal ileum in the central abdomen (series 2, image 55). There is scattered gas and stool throughout the colon to the rectum. Sigmoid diverticulosis. Vascular/Lymphatic: Aortic atherosclerosis. No enlarged abdominal or pelvic lymph nodes. Reproductive: No mass or other significant abnormality. Other: No abdominal wall hernia or abnormality. Moderate volume ascites throughout the abdomen and pelvis. Musculoskeletal: No acute or significant osseous findings. IMPRESSION: 1. The small bowel is diffusely air and fluid distended, with a knuckled transition point of the terminal ileum in the central abdomen. There is is scattered  gas and stool throughout the colon to the rectum. Findings are consistent with distal small bowel obstruction, which may be incomplete or developing given the presence of gas and stool in the colon. 2. Moderate volume ascites throughout the abdomen and pelvis, likely reactive. 3. Aortic Atherosclerosis (ICD10-I70.0). Electronically Signed   By: Lauralyn Primes M.D.   On: 12/31/2019 16:18   DG Abd Portable 1V-Small Bowel Protocol-Position Verification  Result Date: 12/31/2019 CLINICAL DATA:  NG tube placement EXAM: PORTABLE ABDOMEN - 1 VIEW COMPARISON:  None. FINDINGS: Dilated loops of small bowel visible within the upper abdomen. There is excreted contrast material in the renal collecting systems. No radio-opaque calculi or other significant radiographic abnormality are seen. Side port and tip of the NG tube are in the stomach. IMPRESSION: NG tube tip in the stomach. Electronically Signed   By: Deatra Robinson M.D.   On: 12/31/2019 21:34    Review of Systems  Constitutional: Negative for chills and fever.  HENT: Negative for ear discharge, hearing loss and sore throat.   Eyes: Negative for discharge.  Respiratory: Negative for cough and shortness of breath.   Cardiovascular: Negative for chest pain and leg swelling.  Gastrointestinal: Positive for abdominal pain. Negative for constipation, diarrhea, nausea and vomiting.  Musculoskeletal: Negative for myalgias and neck pain.  Skin: Negative for rash.  Allergic/Immunologic: Negative for environmental allergies.  Neurological: Negative for dizziness and seizures.  Hematological: Does not bruise/bleed easily.  Psychiatric/Behavioral: Negative for suicidal ideas.  All other systems reviewed and are negative.  Blood pressure (!) 134/56, pulse (!) 57, temperature 98.9 F (37.2 C), resp. rate 16, height 5\' 7"  (1.702 m), weight 49.9 kg, SpO2 99 %. Physical Exam Constitutional:      Appearance: He is well-developed.     Comments: Conversant No acute  distress  Eyes:     General: Lids are normal. No scleral icterus.    Comments: Pupils are equal round and reactive No lid lag Moist conjunctiva  Neck:     Thyroid: No thyromegaly.     Trachea: No tracheal tenderness.     Comments: No cervical lymphadenopathy Cardiovascular:     Rate and Rhythm: Normal rate and regular rhythm.     Heart sounds: No murmur heard.   Pulmonary:     Effort: Pulmonary effort is normal.     Breath sounds: Normal breath sounds. No wheezing or rales.  Abdominal:     General: There is distension.     Palpations: Abdomen is soft.     Tenderness: There is no abdominal tenderness.     Hernia: No hernia is present.  Skin:    General: Skin is warm.     Findings: No rash.     Nails: There is no clubbing.     Comments:  Normal skin turgor  Neurological:     Mental Status: He is alert and oriented to person, place, and time.     Comments: Normal gait and station  Psychiatric:        Judgment: Judgment normal.     Comments: Appropriate affect     Assessment/Plan: 79 year old male with SBO   1.  Continue NG tube 2.  Patient on SBO protocol 3.  We will continue to follow along.  Axel Filler 01/01/2020, 6:52 AM

## 2020-01-01 NOTE — Progress Notes (Signed)
PROGRESS NOTE  Lee Nelson ZOX:[id number] DOB: Aug 23, 1940   PCP: Chilton GreathouseAvva, Ravisankar, MD  Patient is from: Home  DOA: 12/31/2019 LOS: 1  Brief Narrative / Interim history: 79 y.o. male with history of chronic constipation followed by GI (Dr. Dulce Sellarutlaw), chronic thrombocytopenia, BPH with LUTS, anxiety, osteoarthritis and multiple abdominal surgeries in the past presenting with abdominal pain, bloating and not passing gas, and found to have small bowel obstruction.  In ED, CT abdomen and pelvis revealed SBO with transition point of terminal ileum and central abdomen, and moderate volume ascites thought to be reactive.  General surgery consulted.  NG tube placed and SBO protocol initiated.  Subjective: Seen and examined earlier this morning.  No major events overnight of this morning.  Pain improved but felt some cramping when he ambulated in the hallway.  No bowel movement or flatus yet.  Still feels distended.  About 300 cc bilious output from NG tube overnight.  Objective: Vitals:   12/31/19 2035 12/31/19 2037 01/01/20 0038 01/01/20 0440  BP:  123/66 128/61 (!) 134/56  Pulse: 74 73 (!) 54 (!) 57  Resp:  19 16 16   Temp:  98.4 F (36.9 C) 99.1 F (37.3 C) 98.9 F (37.2 C)  TempSrc:      SpO2: 98% 99% 97% 99%  Weight:      Height:        Intake/Output Summary (Last 24 hours) at 01/01/2020 1204 Last data filed at 01/01/2020 1000 Gross per 24 hour  Intake 1086.87 ml  Output 750 ml  Net 336.87 ml   Filed Weights   12/31/19 1220  Weight: 49.9 kg    Examination:  GENERAL: Frail looking elderly male.  Nontoxic.  No distress. HEENT: MMM.  Vision and hearing grossly intact.  NG tube in place. NECK: Supple.  No apparent JVD.  RESP: On room air.  No IWOB.  Fair aeration bilaterally. CVS:  RRR. Heart sounds normal.  ABD/GI/GU: BS+.  Distended.  No tenderness. MSK/EXT:  Moves extremities.  Significant muscle mass and subcu fat loss. SKIN: no apparent skin lesion or wound NEURO:  Awake, alert and oriented appropriately.  No apparent focal neuro deficit. PSYCH: Calm. Normal affect.  Procedures:  10/2-NG tube placed  Microbiology summarized: COVID-19 PCR negative. Influenza A and B PCR negative.  Assessment & Plan: Small bowel obstruction-history of prior abdominal surgeries. CT abdomen and pelvis revealed SBO with transition point of terminal ileum in the central abdomen, and moderate volume ascites thought to be reactive.  No nausea or vomiting.  No BM or flatus yet. -General surgery managing-NGT and SBO protocol -IV LR at 75 cc an hour -IV fentanyl for pain control -SCD for VTE prophylaxis -N.p.o.  Moderate abdominal ascites-thought to be reactive due to SBO. -Management as above   Dehydration in the setting of SBO. -IV fluid as above  Metabolic acidosis: Likely due to #1.  Resolved.  History of chronic constipation-followed by [facility], Dr. Dulce Sellarutlaw. -Bowel regimen once SBO resolves  History of anxiety-as needed Xanax on his medication list -As needed IV Ativan  Chronic pain/osteoarthritis -IV fentanyl as above.  History of BPH with intermittent dysuria-does not seem to be on medication. -Monitor urine output.  Thrombocytopenia: Chronic.  Improved. -Continue monitoring  Severe malnutrition/underweight: As evidenced by low BMI and significant muscle mass and subcu fat loss.  History of chronic constipation. Body mass index is 17.23 kg/m.  -Consult dietitian once SBO resolves       DVT prophylaxis:  SCDs Start:  12/31/19 1734  Code Status: Full code Family Communication: Updated patient's son over the phone on 10/2. Status is: Inpatient  Remains inpatient appropriate because:Ongoing diagnostic testing needed not appropriate for outpatient work up, IV treatments appropriate due to intensity of illness or inability to take PO and Inpatient level of care appropriate due to severity of illness   Dispo: The patient is from: Home               Anticipated d/c is to: Home              Anticipated d/c date is: 3 days              Patient currently is not medically stable to d/c.       Consultants:  General surgery   Sch Meds:  Scheduled Meds: . bisacodyl  10 mg Rectal Daily  . lip balm  1 application Topical BID  . ondansetron (ZOFRAN) IV  4 mg Intravenous Once   Continuous Infusions: . chlorproMAZINE (THORAZINE) IV    . lactated ringers    . lactated ringers 75 mL/hr at 01/01/20 0955  . methocarbamol (ROBAXIN) IV     PRN Meds:.alum & mag hydroxide-simeth, chlorproMAZINE (THORAZINE) IV, fentaNYL (SUBLIMAZE) injection, lactated ringers, LORazepam, magic mouthwash, menthol-cetylpyridinium, methocarbamol (ROBAXIN) IV, ondansetron **OR** ondansetron (ZOFRAN) IV, phenol  Antimicrobials: Anti-infectives (From admission, onward)   None       I have personally reviewed the following labs and images: CBC: Recent Labs  Lab 12/31/19 1149 01/01/20 0253  WBC 8.3 9.4  NEUTROABS 7.3  --   HGB 15.5 14.3  HCT 45.1 41.8  MCV 98.3 97.9  PLT 100* 144*   BMP &GFR Recent Labs  Lab 12/31/19 1149 01/01/20 0253  NA 137 138  K 4.3 4.2  CL 102 104  CO2 21* 25  GLUCOSE 106* 119*  BUN 18 19  CREATININE 0.56* 0.66  CALCIUM 9.3 8.7*   Estimated Creatinine Clearance: 52.8 mL/min (by C-G formula based on SCr of 0.66 mg/dL). Liver & Pancreas: Recent Labs  Lab 12/31/19 1149 01/01/20 0253  AST 22 19  ALT 22 16  ALKPHOS 72 57  BILITOT 0.9 0.8  PROT 6.4* 5.4*  ALBUMIN 4.0 3.4*   Recent Labs  Lab 12/31/19 1149  LIPASE 26   No results for input(s): AMMONIA in the last 168 hours. Diabetic: No results for input(s): HGBA1C in the last 72 hours. No results for input(s): GLUCAP in the last 168 hours. Cardiac Enzymes: No results for input(s): CKTOTAL, CKMB, CKMBINDEX, TROPONINI in the last 168 hours. No results for input(s): PROBNP in the last 8760 hours. Coagulation Profile: Recent Labs  Lab 01/01/20 0253    INR 1.1   Thyroid Function Tests: No results for input(s): TSH, T4TOTAL, FREET4, T3FREE, THYROIDAB in the last 72 hours. Lipid Profile: No results for input(s): CHOL, HDL, LDLCALC, TRIG, CHOLHDL, LDLDIRECT in the last 72 hours. Anemia Panel: No results for input(s): VITAMINB12, FOLATE, FERRITIN, TIBC, IRON, RETICCTPCT in the last 72 hours. Urine analysis:    Component Value Date/Time   COLORURINE YELLOW 12/31/2019 1149   APPEARANCEUR CLEAR 12/31/2019 1149   LABSPEC 1.025 12/31/2019 1149   PHURINE 5.0 12/31/2019 1149   GLUCOSEU NEGATIVE 12/31/2019 1149   HGBUR NEGATIVE 12/31/2019 1149   BILIRUBINUR NEGATIVE 12/31/2019 1149   KETONESUR 20 (A) 12/31/2019 1149   PROTEINUR 30 (A) 12/31/2019 1149   UROBILINOGEN 0.2 11/20/2014 2159   NITRITE NEGATIVE 12/31/2019 1149   LEUKOCYTESUR NEGATIVE 12/31/2019 1149  Sepsis Labs: Invalid input(s): PROCALCITONIN, LACTICIDVEN  Microbiology: Recent Results (from the past 240 hour(s))  Respiratory Panel by RT PCR (Flu A&B, Covid) - Nasopharyngeal Swab     Status: None   Collection Time: 12/31/19  4:40 PM   Specimen: Nasopharyngeal Swab  Result Value Ref Range Status   SARS Coronavirus 2 by RT PCR NEGATIVE NEGATIVE Final    Comment: (NOTE) SARS-CoV-2 target nucleic acids are NOT DETECTED.  The SARS-CoV-2 RNA is generally detectable in upper respiratoy specimens during the acute phase of infection. The lowest concentration of SARS-CoV-2 viral copies this assay can detect is 131 copies/mL. A negative result does not preclude SARS-Cov-2 infection and should not be used as the sole basis for treatment or other patient management decisions. A negative result may occur with  improper specimen collection/handling, submission of specimen other than nasopharyngeal swab, presence of viral mutation(s) within the areas targeted by this assay, and inadequate number of viral copies (<131 copies/mL). A negative result must be combined with  clinical observations, patient history, and epidemiological information. The expected result is Negative.  Fact Sheet for Patients:  https://www.moore.com/  Fact Sheet for Healthcare Providers:  https://www.young.biz/  This test is no t yet approved or cleared by the [place] FDA and  has been authorized for detection and/or diagnosis of SARS-CoV-2 by FDA under an Emergency Use Authorization (EUA). This EUA will remain  in effect (meaning this test can be used) for the duration of the COVID-19 declaration under Section 564(b)(1) of the Act, 21 U.S.C. section 360bbb-3(b)(1), unless the authorization is terminated or revoked sooner.     Influenza A by PCR NEGATIVE NEGATIVE Final   Influenza B by PCR NEGATIVE NEGATIVE Final    Comment: (NOTE) The Xpert Xpress SARS-CoV-2/FLU/RSV assay is intended as an aid in  the diagnosis of influenza from Nasopharyngeal swab specimens and  should not be used as a sole basis for treatment. Nasal washings and  aspirates are unacceptable for Xpert Xpress SARS-CoV-2/FLU/RSV  testing.  Fact Sheet for Patients: https://www.moore.com/  Fact Sheet for Healthcare Providers: https://www.young.biz/  This test is not yet approved or cleared by the [place] FDA and  has been authorized for detection and/or diagnosis of SARS-CoV-2 by  FDA under an Emergency Use Authorization (EUA). This EUA will remain  in effect (meaning this test can be used) for the duration of the  Covid-19 declaration under Section 564(b)(1) of the Act, 21  U.S.C. section 360bbb-3(b)(1), unless the authorization is  terminated or revoked. Performed at [facility], [address] [place], [place] [address]     Radiology Studies: CT Abdomen Pelvis W Contrast  Result Date: 12/31/2019 CLINICAL DATA:  Abdominal distension, concern for small bowel obstruction EXAM: CT ABDOMEN  AND PELVIS WITH CONTRAST TECHNIQUE: Multidetector CT imaging of the abdomen and pelvis was performed using the standard protocol following bolus administration of intravenous contrast. CONTRAST:  OMNIPAQUE IOHEXOL 300 MG/ML  SOLN COMPARISON:  11/11/2019 FINDINGS: Lower chest: No acute abnormality. Hepatobiliary: Fluid attenuation cyst of the left lobe of the liver. Additional subcentimeter lesions of the liver too small to characterize although very likely small cysts and unchanged compared to prior. Status post cholecystectomy. Postoperative biliary dilatation. Pancreas: Unremarkable. No pancreatic ductal dilatation or surrounding inflammatory changes. Spleen: Normal in size without significant abnormality. Adrenals/Urinary Tract: Adrenal glands are unremarkable. Kidneys are normal, without renal calculi, solid lesion, or hydronephrosis. Bladder is unremarkable. Stomach/Bowel: Stomach is within normal limits. The small bowel is diffusely air  and fluid distended, with a knuckled transition point of the terminal ileum in the central abdomen (series 2, image 55). There is scattered gas and stool throughout the colon to the rectum. Sigmoid diverticulosis. Vascular/Lymphatic: Aortic atherosclerosis. No enlarged abdominal or pelvic lymph nodes. Reproductive: No mass or other significant abnormality. Other: No abdominal wall hernia or abnormality. Moderate volume ascites throughout the abdomen and pelvis. Musculoskeletal: No acute or significant osseous findings. IMPRESSION: 1. The small bowel is diffusely air and fluid distended, with a knuckled transition point of the terminal ileum in the central abdomen. There is is scattered gas and stool throughout the colon to the rectum. Findings are consistent with distal small bowel obstruction, which may be incomplete or developing given the presence of gas and stool in the colon. 2. Moderate volume ascites throughout the abdomen and pelvis, likely reactive. 3. Aortic  Atherosclerosis (ICD10-I70.0). Electronically Signed   By: Lauralyn Primes M.D.   On: 12/31/2019 16:18   DG Abd Portable 1V-Small Bowel Obstruction Protocol-initial, 8 hr delay  Result Date: 01/01/2020 CLINICAL DATA:  Small bowel obstruction, 8 hour delay radiograph, abdominal cramping EXAM: PORTABLE ABDOMEN - 1 VIEW COMPARISON:  12/31/2019 abdominal radiograph and CT abdomen/pelvis. FINDINGS: Enteric tube terminates in the proximal stomach. Cholecystectomy clips are seen in the right upper quadrant of the abdomen. Moderate diffuse small bowel dilatation up to 4.3 cm diameter, not substantially changed. Enteric contrast pools in the gastric fundus. Mild colonic stool. No evidence of pneumatosis or pneumoperitoneum. Excreted contrast in the urinary bladder. IMPRESSION: 1. Persistent moderate diffuse small bowel dilatation, not substantially changed, compatible with distal small obstruction. 2. Enteric contrast pools in the gastric fundus. Enteric tube terminates in the proximal stomach. Electronically Signed   By: Delbert Phenix M.D.   On: 01/01/2020 07:48   DG Abd Portable 1V-Small Bowel Protocol-Position Verification  Result Date: 12/31/2019 CLINICAL DATA:  NG tube placement EXAM: PORTABLE ABDOMEN - 1 VIEW COMPARISON:  None. FINDINGS: Dilated loops of small bowel visible within the upper abdomen. There is excreted contrast material in the renal collecting systems. No radio-opaque calculi or other significant radiographic abnormality are seen. Side port and tip of the NG tube are in the stomach. IMPRESSION: NG tube tip in the stomach. Electronically Signed   By: Deatra Robinson M.D.   On: 12/31/2019 21:34     Jafet Wissing T. Ronnita Paz [facility]  If 7PM-7AM, please contact night-coverage [email or web address] 01/01/2020, 12:04 PM

## 2020-01-02 ENCOUNTER — Inpatient Hospital Stay (HOSPITAL_COMMUNITY): Payer: Medicare Other

## 2020-01-02 LAB — RENAL FUNCTION PANEL
Albumin: 3.2 g/dL — ABNORMAL LOW (ref 3.5–5.0)
Anion gap: 8 (ref 5–15)
BUN: 22 mg/dL (ref 8–23)
CO2: 28 mmol/L (ref 22–32)
Calcium: 8.8 mg/dL — ABNORMAL LOW (ref 8.9–10.3)
Chloride: 104 mmol/L (ref 98–111)
Creatinine, Ser: 0.73 mg/dL (ref 0.61–1.24)
GFR calc Af Amer: >60 mL/min (ref >60)
GFR calc non Af Amer: >60 mL/min (ref >60)
Glucose, Bld: 89 mg/dL (ref 70–99)
Phosphorus: 2.6 mg/dL (ref 2.5–4.6)
Potassium: 3.8 mmol/L (ref 3.5–5.1)
Sodium: 140 mmol/L (ref 135–145)

## 2020-01-02 LAB — MAGNESIUM: Magnesium: 2 mg/dL (ref 1.7–2.4)

## 2020-01-02 MED ORDER — ENOXAPARIN SODIUM 40 MG/0.4ML ~~LOC~~ SOLN
40.0000 mg | SUBCUTANEOUS | Status: DC
  Administered 2020-01-04: 40 mg via SUBCUTANEOUS
  Filled 2020-01-02 (×3): qty 0.4

## 2020-01-02 NOTE — Progress Notes (Signed)
PROGRESS NOTE  Lee Nelson SWN:[id number] DOB: 1940-07-04   PCP: Chilton Greathouse, MD  Patient is from: Home  DOA: 12/31/2019 LOS: 2  Brief Narrative / Interim history: 79 y.o. male with history of chronic constipation followed by GI (Dr. Dulce Sellar), chronic thrombocytopenia, BPH with LUTS, anxiety, osteoarthritis and multiple abdominal surgeries in the past presenting with abdominal pain, bloating and not passing gas, and found to have small bowel obstruction.  In ED, CT abdomen and pelvis revealed SBO with transition point of terminal ileum and central abdomen, and moderate volume ascites thought to be reactive.  General surgery consulted.  NG tube placed and SBO protocol initiated.  NG tube accidentally dislodged on 01/01/2020. Had bowel movements and flatus.  No nausea or emesis.  Subjective: Seen and examined earlier this morning.  Reports about 3 bowel movements after suppository yesterday.  Also passing gas.  No nausea or vomiting.  Still with some cramping abdominal pain.   Objective: Vitals:   01/01/20 1350 01/01/20 2102 01/02/20 0524 01/02/20 1353  BP: 117/60 120/62 (!) 114/57 117/66  Pulse: 64 63 (!) 53 (!) 54  Resp:  16 16 14   Temp: (!) 97.4 F (36.3 C) 98.2 F (36.8 C) 98.6 F (37 C) 97.7 F (36.5 C)  TempSrc: Oral Oral Oral Oral  SpO2: 98% 98% 99% 98%  Weight:      Height:        Intake/Output Summary (Last 24 hours) at 01/02/2020 1457 Last data filed at 01/02/2020 1249 Gross per 24 hour  Intake 1690.82 ml  Output 600 ml  Net 1090.82 ml   Filed Weights   12/31/19 1220  Weight: 49.9 kg    Examination:  GENERAL: No apparent distress.  Nontoxic. HEENT: MMM.  Vision and hearing grossly intact.  NECK: Supple.  No apparent JVD.  RESP:  No IWOB.  Fair aeration bilaterally. CVS:  RRR. Heart sounds normal.  ABD/GI/GU: BS+.  Slightly distended.  Mild diffuse tenderness MSK/EXT:  Moves extremities.  Significant muscle mass and subcu fat loss. SKIN: no  apparent skin lesion or wound NEURO: Awake, alert and oriented appropriately.  No apparent focal neuro deficit. PSYCH: Calm. Normal affect.  Procedures:  10/2-NG tube placed  Microbiology summarized: COVID-19 PCR negative. Influenza A and B PCR negative.  Assessment & Plan: Small bowel obstruction-history of prior abdominal surgeries. CT abdomen and pelvis revealed SBO with transition point of terminal ileum in the central abdomen, and moderate volume ascites thought to be reactive.   SBO seems to have resolved.  Had BMs and flatus -Continue IV LR at 75 cc an hour -IV fentanyl for pain control -SCD for VTE prophylaxis -Clear liquid diet per surgery  Moderate abdominal ascites-thought to be reactive due to SBO. -Management as above   Dehydration in the setting of SBO. -IV fluid as above  Metabolic acidosis: Likely due to #1.  Resolved.  History of chronic constipation-followed by [facility], [facility]. -Continue suppositories  History of anxiety-as needed Xanax on his medication list -As needed IV Ativan  Chronic pain/osteoarthritis -IV fentanyl as above.  History of BPH with intermittent dysuria-does not seem to be on medication.  Good urine output -Monitor urine output.  Thrombocytopenia: Chronic.  Improved. -Continue monitoring  Severe malnutrition/underweight: As evidenced by low BMI and significant muscle mass and subcu fat loss.  History of chronic constipation. Body mass index is 17.23 kg/m.  -Consult dietitian       DVT prophylaxis:  enoxaparin (LOVENOX) injection 40 mg Start: 01/02/20 1500  SCDs Start: 12/31/19 1734  Code Status: Full code Family Communication: Updated patient's son over the phone on 10/2. Status is: Inpatient  Remains inpatient appropriate because:IV treatments appropriate due to intensity of illness or inability to take PO and Inpatient level of care appropriate due to severity of illness   Dispo: The patient is from: Home               Anticipated d/c is to: Home              Anticipated d/c date is: 1 day              Patient currently is not medically stable to d/c.       Consultants:  General surgery   Sch Meds:  Scheduled Meds: . bisacodyl  10 mg Rectal Daily  . enoxaparin (LOVENOX) injection  40 mg Subcutaneous Q24H  . lip balm  1 application Topical BID  . ondansetron (ZOFRAN) IV  4 mg Intravenous Once   Continuous Infusions: . lactated ringers    . lactated ringers 100 mL/hr at 01/02/20 0934  . methocarbamol (ROBAXIN) IV    . ondansetron (ZOFRAN) IV     PRN Meds:.alum & mag hydroxide-simeth, fentaNYL (SUBLIMAZE) injection, lactated ringers, LORazepam, magic mouthwash, menthol-cetylpyridinium, methocarbamol (ROBAXIN) IV, ondansetron (ZOFRAN) IV **OR** ondansetron (ZOFRAN) IV, ondansetron **OR** [DISCONTINUED] ondansetron (ZOFRAN) IV, phenol, prochlorperazine  Antimicrobials: Anti-infectives (From admission, onward)   None       I have personally reviewed the following labs and images: CBC: Recent Labs  Lab 12/31/19 1149 01/01/20 0253  WBC 8.3 9.4  NEUTROABS 7.3  --   HGB 15.5 14.3  HCT 45.1 41.8  MCV 98.3 97.9  PLT 100* 144*   BMP &GFR Recent Labs  Lab 12/31/19 1149 01/01/20 0253 01/02/20 0356  NA 137 138 140  K 4.3 4.2 3.8  CL 102 104 104  CO2 21* 25 28  GLUCOSE 106* 119* 89  BUN 18 19 22   CREATININE 0.56* 0.66 0.73  CALCIUM 9.3 8.7* 8.8*  MG  --   --  2.0  PHOS  --   --  2.6   Estimated Creatinine Clearance: 52.8 mL/min (by C-G formula based on SCr of 0.73 mg/dL). Liver & Pancreas: Recent Labs  Lab 12/31/19 1149 01/01/20 0253 01/02/20 0356  AST 22 19  --   ALT 22 16  --   ALKPHOS 72 57  --   BILITOT 0.9 0.8  --   PROT 6.4* 5.4*  --   ALBUMIN 4.0 3.4* 3.2*   Recent Labs  Lab 12/31/19 1149  LIPASE 26   No results for input(s): AMMONIA in the last 168 hours. Diabetic: No results for input(s): HGBA1C in the last 72 hours. No results for input(s):  GLUCAP in the last 168 hours. Cardiac Enzymes: No results for input(s): CKTOTAL, CKMB, CKMBINDEX, TROPONINI in the last 168 hours. No results for input(s): PROBNP in the last 8760 hours. Coagulation Profile: Recent Labs  Lab 01/01/20 0253  INR 1.1   Thyroid Function Tests: No results for input(s): TSH, T4TOTAL, FREET4, T3FREE, THYROIDAB in the last 72 hours. Lipid Profile: No results for input(s): CHOL, HDL, LDLCALC, TRIG, CHOLHDL, LDLDIRECT in the last 72 hours. Anemia Panel: No results for input(s): VITAMINB12, FOLATE, FERRITIN, TIBC, IRON, RETICCTPCT in the last 72 hours. Urine analysis:    Component Value Date/Time   COLORURINE YELLOW 12/31/2019 1149   APPEARANCEUR CLEAR 12/31/2019 1149   LABSPEC 1.025 12/31/2019 1149   PHURINE 5.0  12/31/2019 1149   GLUCOSEU NEGATIVE 12/31/2019 1149   HGBUR NEGATIVE 12/31/2019 1149   BILIRUBINUR NEGATIVE 12/31/2019 1149   KETONESUR 20 (A) 12/31/2019 1149   PROTEINUR 30 (A) 12/31/2019 1149   UROBILINOGEN 0.2 11/20/2014 2159   NITRITE NEGATIVE 12/31/2019 1149   LEUKOCYTESUR NEGATIVE 12/31/2019 1149   Sepsis Labs: Invalid input(s): PROCALCITONIN, LACTICIDVEN  Microbiology: Recent Results (from the past 240 hour(s))  Respiratory Panel by RT PCR (Flu A&B, Covid) - Nasopharyngeal Swab     Status: None   Collection Time: 12/31/19  4:40 PM   Specimen: Nasopharyngeal Swab  Result Value Ref Range Status   SARS Coronavirus 2 by RT PCR NEGATIVE NEGATIVE Final    Comment: (NOTE) SARS-CoV-2 target nucleic acids are NOT DETECTED.  The SARS-CoV-2 RNA is generally detectable in upper respiratoy specimens during the acute phase of infection. The lowest concentration of SARS-CoV-2 viral copies this assay can detect is 131 copies/mL. A negative result does not preclude SARS-Cov-2 infection and should not be used as the sole basis for treatment or other patient management decisions. A negative result may occur with  improper specimen  collection/handling, submission of specimen other than nasopharyngeal swab, presence of viral mutation(s) within the areas targeted by this assay, and inadequate number of viral copies (<131 copies/mL). A negative result must be combined with clinical observations, patient history, and epidemiological information. The expected result is Negative.  Fact Sheet for Patients:  https://www.moore.com/https://www.fda.gov/media/142436/download  Fact Sheet for Healthcare Providers:  https://www.young.biz/https://www.fda.gov/media/142435/download  This test is no t yet approved or cleared by the [place] FDA and  has been authorized for detection and/or diagnosis of SARS-CoV-2 by FDA under an Emergency Use Authorization (EUA). This EUA will remain  in effect (meaning this test can be used) for the duration of the COVID-19 declaration under Section 564(b)(1) of the Act, 21 U.S.C. section 360bbb-3(b)(1), unless the authorization is terminated or revoked sooner.     Influenza A by PCR NEGATIVE NEGATIVE Final   Influenza B by PCR NEGATIVE NEGATIVE Final    Comment: (NOTE) The Xpert Xpress SARS-CoV-2/FLU/RSV assay is intended as an aid in  the diagnosis of influenza from Nasopharyngeal swab specimens and  should not be used as a sole basis for treatment. Nasal washings and  aspirates are unacceptable for Xpert Xpress SARS-CoV-2/FLU/RSV  testing.  Fact Sheet for Patients: https://www.moore.com/https://www.fda.gov/media/142436/download  Fact Sheet for Healthcare Providers: https://www.young.biz/https://www.fda.gov/media/142435/download  This test is not yet approved or cleared by the [place] FDA and  has been authorized for detection and/or diagnosis of SARS-CoV-2 by  FDA under an Emergency Use Authorization (EUA). This EUA will remain  in effect (meaning this test can be used) for the duration of the  Covid-19 declaration under Section 564(b)(1) of the Act, 21  U.S.C. section 360bbb-3(b)(1), unless the authorization is  terminated or revoked. Performed at [facility], [address] [place], [place] [address]     Radiology Studies: DG Abd Portable 1V  Result Date: 01/02/2020 CLINICAL DATA:  Recent bowel obstruction EXAM: PORTABLE ABDOMEN - 1 VIEW COMPARISON:  January 01, 2020 abdominal radiograph in December 31, 2019 CT abdomen and pelvis FINDINGS: There remain loops of dilated small bowel, slightly less than on 1 day prior. Scattered areas of air noted in colon. No free air. There are surgical clips in the right quadrant. Foci of arterial vascular calcification noted in the pelvis. Nasogastric tube no longer evident. IMPRESSION: Overall slightly less bowel dilatation compared to 1 day prior with air noted  in colon and rectum. No free air. Clips right upper quadrant noted. Nasogastric tube no longer evident. Electronically Signed   By: Bretta Bang III M.D.   On: 01/02/2020 08:11     Addilee Neu T. Adah Stoneberg [facility]  If 7PM-7AM, please contact night-coverage [email or web address] 01/02/2020, 2:57 PM

## 2020-01-02 NOTE — Progress Notes (Addendum)
[facility] Progress Note     Subjective: CC:  States his goal is to get better as quickly as possible in order to get home. Reports 3 formed BMs yesterday after suppository. Reports flatus yesterday and today. Denies nausea or emesis. Ongoing distention.   States he has had increased belching for about a month. Also reports constipation at baseline with large hard stools that he sometimes has to help extract with digital rectal stimulation.   Also reports dermatologic procedure right ankle about one week ago.   Objective: Vital signs in last 24 hours: Temp:  [97.4 F (36.3 C)-98.6 F (37 C)] 98.6 F (37 C) (10/04 0524) Pulse Rate:  [53-64] 53 (10/04 0524) Resp:  [16] 16 (10/04 0524) BP: (114-120)/(57-62) 114/57 (10/04 0524) SpO2:  [98 %-99 %] 99 % (10/04 0524) Last BM Date: 01/01/20  Intake/Output from previous day: 10/03 0701 - 10/04 0700 In: 2279.9 [P.O.:120; I.V.:2159.9] Out: 800 [Urine:700; Emesis/NG output:100] Intake/Output this shift: No intake/output data recorded.  PE: Gen:  Alert, NAD, pleasant Card:  Regular rate and rhythm, pedal pulses 2+ BL Pulm:  Normal effort, clear to auscultation bilaterally Abd: Soft, mild to moderate distention, +BS, nontender  Skin: warm and dry, no rashes   Psych: A&Ox3   Lab Results:  Recent Labs    12/31/19 1149 01/01/20 0253  WBC 8.3 9.4  HGB 15.5 14.3  HCT 45.1 41.8  PLT 100* 144*   BMET Recent Labs    01/01/20 0253 01/02/20 0356  NA 138 140  K 4.2 3.8  CL 104 104  CO2 25 28  GLUCOSE 119* 89  BUN 19 22  CREATININE 0.66 0.73  CALCIUM 8.7* 8.8*   PT/INR Recent Labs    01/01/20 0253  LABPROT 13.7  INR 1.1   CMP     Component Value Date/Time   NA 140 01/02/2020 0356   K 3.8 01/02/2020 0356   CL 104 01/02/2020 0356   CO2 28 01/02/2020 0356   GLUCOSE 89 01/02/2020 0356   BUN 22 01/02/2020 0356   CREATININE 0.73 01/02/2020 0356   CALCIUM 8.8 (L) 01/02/2020 0356   PROT 5.4 (L) 01/01/2020  0253   ALBUMIN 3.2 (L) 01/02/2020 0356   AST 19 01/01/2020 0253   ALT 16 01/01/2020 0253   ALKPHOS 57 01/01/2020 0253   BILITOT 0.8 01/01/2020 0253   GFRNONAA >60 01/02/2020 0356   GFRAA >60 01/02/2020 0356   Lipase     Component Value Date/Time   LIPASE 26 12/31/2019 1149       Studies/Results: CT Abdomen Pelvis W Contrast  Result Date: 12/31/2019 CLINICAL DATA:  Abdominal distension, concern for small bowel obstruction EXAM: CT ABDOMEN AND PELVIS WITH CONTRAST TECHNIQUE: Multidetector CT imaging of the abdomen and pelvis was performed using the standard protocol following bolus administration of intravenous contrast. CONTRAST:  OMNIPAQUE IOHEXOL 300 MG/ML  SOLN COMPARISON:  11/11/2019 FINDINGS: Lower chest: No acute abnormality. Hepatobiliary: Fluid attenuation cyst of the left lobe of the liver. Additional subcentimeter lesions of the liver too small to characterize although very likely small cysts and unchanged compared to prior. Status post cholecystectomy. Postoperative biliary dilatation. Pancreas: Unremarkable. No pancreatic ductal dilatation or surrounding inflammatory changes. Spleen: Normal in size without significant abnormality. Adrenals/Urinary Tract: Adrenal glands are unremarkable. Kidneys are normal, without renal calculi, solid lesion, or hydronephrosis. Bladder is unremarkable. Stomach/Bowel: Stomach is within normal limits. The small bowel is diffusely air and fluid distended, with a knuckled transition point of the terminal ileum  in the central abdomen (series 2, image 55). There is scattered gas and stool throughout the colon to the rectum. Sigmoid diverticulosis. Vascular/Lymphatic: Aortic atherosclerosis. No enlarged abdominal or pelvic lymph nodes. Reproductive: No mass or other significant abnormality. Other: No abdominal wall hernia or abnormality. Moderate volume ascites throughout the abdomen and pelvis. Musculoskeletal: No acute or significant osseous  findings. IMPRESSION: 1. The small bowel is diffusely air and fluid distended, with a knuckled transition point of the terminal ileum in the central abdomen. There is is scattered gas and stool throughout the colon to the rectum. Findings are consistent with distal small bowel obstruction, which may be incomplete or developing given the presence of gas and stool in the colon. 2. Moderate volume ascites throughout the abdomen and pelvis, likely reactive. 3. Aortic Atherosclerosis (ICD10-I70.0). Electronically Signed   By: Lauralyn Primes M.D.   On: 12/31/2019 16:18   DG Abd Portable 1V  Result Date: 01/02/2020 CLINICAL DATA:  Recent bowel obstruction EXAM: PORTABLE ABDOMEN - 1 VIEW COMPARISON:  January 01, 2020 abdominal radiograph in December 31, 2019 CT abdomen and pelvis FINDINGS: There remain loops of dilated small bowel, slightly less than on 1 day prior. Scattered areas of air noted in colon. No free air. There are surgical clips in the right quadrant. Foci of arterial vascular calcification noted in the pelvis. Nasogastric tube no longer evident. IMPRESSION: Overall slightly less bowel dilatation compared to 1 day prior with air noted in colon and rectum. No free air. Clips right upper quadrant noted. Nasogastric tube no longer evident. Electronically Signed   By: Bretta Bang III M.D.   On: 01/02/2020 08:11   DG Abd Portable 1V-Small Bowel Obstruction Protocol-initial, 8 hr delay  Result Date: 01/01/2020 CLINICAL DATA:  Small bowel obstruction, 8 hour delay radiograph, abdominal cramping EXAM: PORTABLE ABDOMEN - 1 VIEW COMPARISON:  12/31/2019 abdominal radiograph and CT abdomen/pelvis. FINDINGS: Enteric tube terminates in the proximal stomach. Cholecystectomy clips are seen in the right upper quadrant of the abdomen. Moderate diffuse small bowel dilatation up to 4.3 cm diameter, not substantially changed. Enteric contrast pools in the gastric fundus. Mild colonic stool. No evidence of pneumatosis or  pneumoperitoneum. Excreted contrast in the urinary bladder. IMPRESSION: 1. Persistent moderate diffuse small bowel dilatation, not substantially changed, compatible with distal small obstruction. 2. Enteric contrast pools in the gastric fundus. Enteric tube terminates in the proximal stomach. Electronically Signed   By: Delbert Phenix M.D.   On: 01/01/2020 07:48   DG Abd Portable 1V-Small Bowel Protocol-Position Verification  Result Date: 12/31/2019 CLINICAL DATA:  NG tube placement EXAM: PORTABLE ABDOMEN - 1 VIEW COMPARISON:  None. FINDINGS: Dilated loops of small bowel visible within the upper abdomen. There is excreted contrast material in the renal collecting systems. No radio-opaque calculi or other significant radiographic abnormality are seen. Side port and tip of the NG tube are in the stomach. IMPRESSION: NG tube tip in the stomach. Electronically Signed   By: Deatra Robinson M.D.   On: 12/31/2019 21:34    Anti-infectives: Anti-infectives (From admission, onward)   None     Assessment/Plan SBO  - surgical history: open Nissen fundoplication 80s, cholecystectomy, appendectomy, and most recently hernia repair approximately 5 years ago. - SB protocol 10/3  - NG accidentally dislodged then removed 10/3 - having flatus and some stool. Slight interval improvement in KUB today w/ air in colon and less small bowel distention. Continue NPO/ice chips and re-check this afternoon. If he continues to clinically improve then  will start clear liquids. If he develops worsening distention, nausea, or emesis then NG tube will need to be replaced this afternoon.  - CCS will follow   LOS: 2 days    Hosie Spangle, [facility] Please see Amion for pager number during day hours 7:00am-4:30pm

## 2020-01-02 NOTE — Evaluation (Signed)
Physical Therapy Evaluation Patient Details Name: Lee Nelson MRN: [id number] DOB: 1940-11-23 Today's Date: 01/02/2020   History of Present Illness  79 y.o. male with history of chronic constipation followed by GI (Dr. Dulce Sellar), chronic thrombocytopenia, BPH with LUTS, anxiety, osteoarthritis and multiple abdominal surgeries in the past presenting with abdominal pain, bloating and not passing gas, and found to have small bowel obstruction.  Clinical Impression  Pt admitted with above diagnosis.  Pt currently amb with RW in hallway, has been amb with nursing staff as well; will continue to follow for LE strengthening/balance, pt would like to return to his PLOF--->amb with SPC.  Pt currently with functional limitations due to the deficits listed below (see PT Problem List). Pt will benefit from skilled PT to increase their independence and safety with mobility to allow discharge to the venue listed below.       Follow Up Recommendations No PT follow up    Equipment Recommendations  None recommended by PT    Recommendations for Other Services       Precautions / Restrictions Precautions Precautions: Fall Restrictions Weight Bearing Restrictions: No      Mobility  Bed Mobility               General bed mobility comments: in recliner  Transfers Overall transfer level: Needs assistance Equipment used: Rolling walker (2 wheeled) Transfers: Sit to/from Stand Sit to Stand: Supervision         General transfer comment: cues for safety  Ambulation/Gait Ambulation/Gait assistance: Supervision;Min guard Gait Distance (Feet): 600 Feet Assistive device: Rolling walker (2 wheeled) Gait Pattern/deviations: Step-through pattern;Decreased stride length;Trunk flexed;Narrow base of support     General Gait Details: slight unsteadiness with turns, min/guard for safety  Stairs            Wheelchair Mobility    Modified Rankin (Stroke Patients Only)        Balance Overall balance assessment: Mild deficits observed, not formally tested (denies falls, amb with cane)                                           Pertinent Vitals/Pain Pain Assessment: No/denies pain    Home Living Family/patient expects to be discharged to:: Private residence Living Arrangements: Spouse/significant other Available Help at Discharge: Family Type of Home: House Home Access: Ramped entrance     Home Layout: Two level;Able to live on main level with bedroom/bathroom Home Equipment: Gilmer Mor - single point Additional Comments: pt wife is incomplete quadripegic so house is handicap accessible    Prior Function Level of Independence: Independent with assistive device(s)         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 reports some difficulty transferring out of his car     Hand Dominance        Extremity/Trunk Assessment   Upper Extremity Assessment Upper Extremity Assessment: Overall WFL for tasks assessed    Lower Extremity Assessment Lower Extremity Assessment: Overall WFL for tasks assessed       Communication   Communication: No difficulties  Cognition Arousal/Alertness: Awake/alert Behavior During Therapy: WFL for tasks assessed/performed Overall Cognitive Status: Within Functional Limits for tasks assessed  General Comments      Exercises     Assessment/Plan    PT Assessment Patient needs continued PT services  PT Problem List Decreased balance;Decreased activity tolerance;Decreased mobility       PT Treatment Interventions DME instruction;Therapeutic exercise;Gait training;Functional mobility training;Therapeutic activities;Patient/family education;Balance training    PT Goals (Current goals can be found in the Care Plan section)  Acute Rehab PT Goals Patient Stated Goal: get back to PLOF PT Goal Formulation: With  patient Time For Goal Achievement: 01/16/20 Potential to Achieve Goals: Good    Frequency Min 2X/week   Barriers to discharge        Co-evaluation               AM-PAC PT "6 Clicks" Mobility  Outcome Measure Help needed turning from your back to your side while in a flat bed without using bedrails?: None Help needed moving from lying on your back to sitting on the side of a flat bed without using bedrails?: None Help needed moving to and from a bed to a chair (including a wheelchair)?: None Help needed standing up from a chair using your arms (e.g., wheelchair or bedside chair)?: A Little Help needed to walk in hospital room?: A Little Help needed climbing 3-5 steps with a railing? : A Little 6 Click Score: 21    End of Session   Activity Tolerance: Patient tolerated treatment well Patient left: in chair;with call bell/phone within reach;with nursing/sitter in room   PT Visit Diagnosis: Difficulty in walking, not elsewhere classified (R26.2)    Time: 6384-5364 PT Time Calculation (min) (ACUTE ONLY): 20 min   Charges:   PT Evaluation $PT Eval Low Complexity: 1 Low          Krystalyn Kubota, PT  Acute Rehab Dept ([facility]) [phone] Pager [phone]  01/02/2020   [facility] 01/02/2020, 4:08 PM

## 2020-01-03 ENCOUNTER — Inpatient Hospital Stay (HOSPITAL_COMMUNITY): Payer: Medicare Other

## 2020-01-03 DIAGNOSIS — D649 Anemia, unspecified: Secondary | ICD-10-CM

## 2020-01-03 LAB — RENAL FUNCTION PANEL
Albumin: 3.1 g/dL — ABNORMAL LOW (ref 3.5–5.0)
Anion gap: 11 (ref 5–15)
BUN: 22 mg/dL (ref 8–23)
CO2: 24 mmol/L (ref 22–32)
Calcium: 8.4 mg/dL — ABNORMAL LOW (ref 8.9–10.3)
Chloride: 105 mmol/L (ref 98–111)
Creatinine, Ser: 0.7 mg/dL (ref 0.61–1.24)
GFR calc Af Amer: >60 mL/min (ref >60)
GFR calc non Af Amer: >60 mL/min (ref >60)
Glucose, Bld: 69 mg/dL — ABNORMAL LOW (ref 70–99)
Phosphorus: 2.8 mg/dL (ref 2.5–4.6)
Potassium: 3.5 mmol/L (ref 3.5–5.1)
Sodium: 140 mmol/L (ref 135–145)

## 2020-01-03 LAB — CBC
HCT: 35 % — ABNORMAL LOW (ref 39.0–52.0)
Hemoglobin: 11.7 g/dL — ABNORMAL LOW (ref 13.0–17.0)
MCH: 32.9 pg (ref 26.0–34.0)
MCHC: 33.4 g/dL (ref 30.0–36.0)
MCV: 98.3 fL (ref 80.0–100.0)
Platelets: 122 10*3/uL — ABNORMAL LOW (ref 150–400)
RBC: 3.56 MIL/uL — ABNORMAL LOW (ref 4.22–5.81)
RDW: 12.9 % (ref 11.5–15.5)
WBC: 4.8 10*3/uL (ref 4.0–10.5)
nRBC: 0 % (ref 0.0–0.2)

## 2020-01-03 LAB — MAGNESIUM: Magnesium: 2 mg/dL (ref 1.7–2.4)

## 2020-01-03 LAB — GLUCOSE, CAPILLARY
Glucose-Capillary: 63 mg/dL — ABNORMAL LOW (ref 70–99)
Glucose-Capillary: 82 mg/dL (ref 70–99)

## 2020-01-03 MED ORDER — DEXTROSE-NACL 5-0.9 % IV SOLN: INTRAVENOUS | Status: DC

## 2020-01-03 NOTE — Progress Notes (Signed)
Patient's blood sugar was 63 at 0731. MD order D5 NS. Blood sugar rechecked at 0809 after D5 NS had been running and was 82.

## 2020-01-03 NOTE — Progress Notes (Signed)
PROGRESS NOTE  Lee Nelson GDJ:[id number] DOB: 1940/08/02   PCP: Chilton Greathouse, MD  Patient is from: Home  DOA: 12/31/2019 LOS: 3  Brief Narrative / Interim history: 79 y.o. male with history of chronic constipation followed by GI (Dr. Dulce Sellar), chronic thrombocytopenia, BPH with LUTS, anxiety, osteoarthritis and multiple abdominal surgeries in the past presenting with abdominal pain, bloating and not passing gas, and found to have small bowel obstruction.  In ED, CT abdomen and pelvis revealed SBO with transition point of terminal ileum and central abdomen, and moderate volume ascites thought to be reactive.  General surgery consulted.  NG tube placed and SBO protocol initiated.  NG tube accidentally dislodged on 01/01/2020. Had bowel movements and flatus.  No nausea or emesis.  Started on clear liquid diet.  General surgery following.  Subjective: Seen and examined earlier this morning.  No major events overnight of this morning.  Having bowel movements.  Abdominal pain improved.  No nausea or emesis.  Slight drop in H&H but he denies melena or hematochezia although he reports history of internal hemorrhoid.  Likes to talk to dietitian.   Objective: Vitals:   01/02/20 1353 01/02/20 2214 01/03/20 0520 01/03/20 0653  BP: 117/66 128/62  (!) 117/49  Pulse: (!) 54 64  62  Resp: 14 18 17 16   Temp: 97.7 F (36.5 C) 98.3 F (36.8 C) 98.1 F (36.7 C) 98.1 F (36.7 C)  TempSrc: Oral  Oral Oral  SpO2: 98% 100%  98%  Weight:      Height:        Intake/Output Summary (Last 24 hours) at 01/03/2020 1132 Last data filed at 01/03/2020 1040 Gross per 24 hour  Intake 2959.98 ml  Output 775 ml  Net 2184.98 ml   Filed Weights   12/31/19 1220  Weight: 49.9 kg    Examination:  GENERAL: No apparent distress.  Sitting on bedside chair. HEENT: MMM.  Vision and hearing grossly intact.  NECK: Supple.  No apparent JVD.  RESP:  No IWOB.  Fair aeration bilaterally. CVS:  RRR. Heart  sounds normal.  ABD/GI/GU: BS+. Abd soft, NTND.  MSK/EXT:  Moves extremities.  Significant muscle mass and subcu fat loss. SKIN: no apparent skin lesion or wound NEURO: Awake, alert and oriented appropriately.  No apparent focal neuro deficit. PSYCH: Calm. Normal affect.  Procedures:  10/2-NG tube placed  Microbiology summarized: COVID-19 PCR negative. Influenza A and B PCR negative.  Assessment & Plan: Small bowel obstruction-history of prior abdominal surgeries. CT abdomen and pelvis revealed SBO with transition point of terminal ileum in the central abdomen, and moderate volume ascites thought to be reactive.   SBO seems to have resolved.  Has BMs and flatus.  -Changed LR to D5-NS due to mild hypoglycemia -IV fentanyl for pain control -Clear liquid diet per surgery  Moderate abdominal ascites-thought to be reactive due to SBO. -Management as above   Dehydration in the setting of SBO. -IV fluid as above  Metabolic acidosis: Likely due to #1.  Resolved.  Normocytic anemia: Hgb 11.7, dropped about 2 g since admit.  Likely dilutional from IV fluid -Continue monitoring  History of chronic constipation-followed by [facility], Dr. 03/01/20. -Continue suppositories as needed  History of anxiety-as needed Xanax on his medication list -As needed IV Ativan  Chronic pain/osteoarthritis -IV fentanyl as above.  History of BPH with intermittent dysuria-does not seem to be on medication.  Good urine output -Monitor urine output.  Thrombocytopenia: Chronic.  Stable. -Continue monitoring  Severe malnutrition/underweight: As evidenced by low BMI and significant muscle mass and subcu fat loss.  History of chronic constipation. Body mass index is 17.23 kg/m.  -Consult dietitian       DVT prophylaxis:  enoxaparin (LOVENOX) injection 40 mg Start: 01/02/20 1600 SCDs Start: 12/31/19 1734  Code Status: Full code Family Communication: Updated patient's son over the phone on  10/2.  None at bedside today Status is: Inpatient  Remains inpatient appropriate because:IV treatments appropriate due to intensity of illness or inability to take PO and Inpatient level of care appropriate due to severity of illness   Dispo: The patient is from: Home              Anticipated d/c is to: Home              Anticipated d/c date is: 1 day              Patient currently is not medically stable to d/c.       Consultants:  General surgery   Sch Meds:  Scheduled Meds: . bisacodyl  10 mg Rectal Daily  . enoxaparin (LOVENOX) injection  40 mg Subcutaneous Q24H  . lip balm  1 application Topical BID  . ondansetron (ZOFRAN) IV  4 mg Intravenous Once   Continuous Infusions: . dextrose 5 % and 0.9% NaCl 60 mL/hr at 01/03/20 0738  . methocarbamol (ROBAXIN) IV    . ondansetron (ZOFRAN) IV     PRN Meds:.alum & mag hydroxide-simeth, fentaNYL (SUBLIMAZE) injection, LORazepam, magic mouthwash, menthol-cetylpyridinium, methocarbamol (ROBAXIN) IV, ondansetron (ZOFRAN) IV **OR** ondansetron (ZOFRAN) IV, ondansetron **OR** [DISCONTINUED] ondansetron (ZOFRAN) IV, phenol, prochlorperazine  Antimicrobials: Anti-infectives (From admission, onward)   None       I have personally reviewed the following labs and images: CBC: Recent Labs  Lab 12/31/19 1149 01/01/20 0253 01/03/20 0255  WBC 8.3 9.4 4.8  NEUTROABS 7.3  --   --   HGB 15.5 14.3 11.7*  HCT 45.1 41.8 35.0*  MCV 98.3 97.9 98.3  PLT 100* 144* 122*   BMP &GFR Recent Labs  Lab 12/31/19 1149 01/01/20 0253 01/02/20 0356 01/03/20 0255  NA 137 138 140 140  K 4.3 4.2 3.8 3.5  CL 102 104 104 105  CO2 21* 25 28 24   GLUCOSE 106* 119* 89 69*  BUN 18 19 22 22   CREATININE 0.56* 0.66 0.73 0.70  CALCIUM 9.3 8.7* 8.8* 8.4*  MG  --   --  2.0 2.0  PHOS  --   --  2.6 2.8   Estimated Creatinine Clearance: 52.8 mL/min (by C-G formula based on SCr of 0.7 mg/dL). Liver & Pancreas: Recent Labs  Lab 12/31/19 1149  01/01/20 0253 01/02/20 0356 01/03/20 0255  AST 22 19  --   --   ALT 22 16  --   --   ALKPHOS 72 57  --   --   BILITOT 0.9 0.8  --   --   PROT 6.4* 5.4*  --   --   ALBUMIN 4.0 3.4* 3.2* 3.1*   Recent Labs  Lab 12/31/19 1149  LIPASE 26   No results for input(s): AMMONIA in the last 168 hours. Diabetic: No results for input(s): HGBA1C in the last 72 hours. Recent Labs  Lab 01/03/20 0731 01/03/20 0809  GLUCAP 63* 82   Cardiac Enzymes: No results for input(s): CKTOTAL, CKMB, CKMBINDEX, TROPONINI in the last 168 hours. No results for input(s): PROBNP in the last 8760 hours. Coagulation Profile: Recent  Labs  Lab 01/01/20 0253  INR 1.1   Thyroid Function Tests: No results for input(s): TSH, T4TOTAL, FREET4, T3FREE, THYROIDAB in the last 72 hours. Lipid Profile: No results for input(s): CHOL, HDL, LDLCALC, TRIG, CHOLHDL, LDLDIRECT in the last 72 hours. Anemia Panel: No results for input(s): VITAMINB12, FOLATE, FERRITIN, TIBC, IRON, RETICCTPCT in the last 72 hours. Urine analysis:    Component Value Date/Time   COLORURINE YELLOW 12/31/2019 1149   APPEARANCEUR CLEAR 12/31/2019 1149   LABSPEC 1.025 12/31/2019 1149   PHURINE 5.0 12/31/2019 1149   GLUCOSEU NEGATIVE 12/31/2019 1149   HGBUR NEGATIVE 12/31/2019 1149   BILIRUBINUR NEGATIVE 12/31/2019 1149   KETONESUR 20 (A) 12/31/2019 1149   PROTEINUR 30 (A) 12/31/2019 1149   UROBILINOGEN 0.2 11/20/2014 2159   NITRITE NEGATIVE 12/31/2019 1149   LEUKOCYTESUR NEGATIVE 12/31/2019 1149   Sepsis Labs: Invalid input(s): PROCALCITONIN, LACTICIDVEN  Microbiology: Recent Results (from the past 240 hour(s))  Respiratory Panel by RT PCR (Flu A&B, Covid) - Nasopharyngeal Swab     Status: None   Collection Time: 12/31/19  4:40 PM   Specimen: Nasopharyngeal Swab  Result Value Ref Range Status   SARS Coronavirus 2 by RT PCR NEGATIVE NEGATIVE Final    Comment: (NOTE) SARS-CoV-2 target nucleic acids are NOT DETECTED.  The SARS-CoV-2  RNA is generally detectable in upper respiratoy specimens during the acute phase of infection. The lowest concentration of SARS-CoV-2 viral copies this assay can detect is 131 copies/mL. A negative result does not preclude SARS-Cov-2 infection and should not be used as the sole basis for treatment or other patient management decisions. A negative result may occur with  improper specimen collection/handling, submission of specimen other than nasopharyngeal swab, presence of viral mutation(s) within the areas targeted by this assay, and inadequate number of viral copies (<131 copies/mL). A negative result must be combined with clinical observations, patient history, and epidemiological information. The expected result is Negative.  Fact Sheet for Patients:  https://www.moore.com/  Fact Sheet for Healthcare Providers:  https://www.young.biz/  This test is no t yet approved or cleared by the [place] FDA and  has been authorized for detection and/or diagnosis of SARS-CoV-2 by FDA under an Emergency Use Authorization (EUA). This EUA will remain  in effect (meaning this test can be used) for the duration of the COVID-19 declaration under Section 564(b)(1) of the Act, 21 U.S.C. section 360bbb-3(b)(1), unless the authorization is terminated or revoked sooner.     Influenza A by PCR NEGATIVE NEGATIVE Final   Influenza B by PCR NEGATIVE NEGATIVE Final    Comment: (NOTE) The Xpert Xpress SARS-CoV-2/FLU/RSV assay is intended as an aid in  the diagnosis of influenza from Nasopharyngeal swab specimens and  should not be used as a sole basis for treatment. Nasal washings and  aspirates are unacceptable for Xpert Xpress SARS-CoV-2/FLU/RSV  testing.  Fact Sheet for Patients: https://www.moore.com/  Fact Sheet for Healthcare Providers: https://www.young.biz/  This test is not yet approved or cleared by the  [place] FDA and  has been authorized for detection and/or diagnosis of SARS-CoV-2 by  FDA under an Emergency Use Authorization (EUA). This EUA will remain  in effect (meaning this test can be used) for the duration of the  Covid-19 declaration under Section 564(b)(1) of the Act, 21  U.S.C. section 360bbb-3(b)(1), unless the authorization is  terminated or revoked. Performed at [facility], [address] [place], [place] [address]     Radiology Studies: DG Abd Portable 1V  Result Date: 01/03/2020 CLINICAL DATA:  Small bowel obstruction. EXAM: PORTABLE ABDOMEN - 1 VIEW COMPARISON:  01/02/2020 FINDINGS: 0556 hours. Film is overpenetrated. Interval decrease in diffuse gaseous small bowel distension with some residual distended central small bowel loops on today's exam. IMPRESSION: Interval decrease in the diffuse gaseous small bowel distention seen previously. Electronically Signed   By: Kennith CenterEric  Mansell M.D.   On: 01/03/2020 07:27     Jamaury Gumz T. Lilit Cinelli [facility]  If 7PM-7AM, please contact night-coverage [email or web address] 01/03/2020, 11:32 AM

## 2020-01-03 NOTE — Progress Notes (Signed)
[facility] Progress Note     Subjective: CC:  Overall feels better - 3-4 BMs yesterday, no nausea or emesis, walked a lot. Having some flatus.  Objective: Vital signs in last 24 hours: Temp:  [97.7 F (36.5 C)-98.3 F (36.8 C)] 98.1 F (36.7 C) (10/05 0653) Pulse Rate:  [54-64] 62 (10/05 0653) Resp:  [14-18] 16 (10/05 0653) BP: (117-128)/(49-66) 117/49 (10/05 0653) SpO2:  [98 %-100 %] 98 % (10/05 0653) Last BM Date: 01/02/20  Intake/Output from previous day: 10/04 0701 - 10/05 0700 In: 2177 [P.O.:30; I.V.:2147] Out: 425 [Urine:425] Intake/Output this shift: No intake/output data recorded.  PE: Gen:  Alert, NAD, pleasant Card:  Regular rate and rhythm, pedal pulses 2+ BL Pulm:  Normal effort, clear to auscultation bilaterally Abd: Soft, mild distention - much softer and less distended than yesterday, +BS, mild lower abdominal tenderness without peritonitis  Skin: warm and dry, no rashes   Psych: A&Ox3   Lab Results:  Recent Labs    01/01/20 0253 01/03/20 0255  WBC 9.4 4.8  HGB 14.3 11.7*  HCT 41.8 35.0*  PLT 144* 122*   BMET Recent Labs    01/02/20 0356 01/03/20 0255  NA 140 140  K 3.8 3.5  CL 104 105  CO2 28 24  GLUCOSE 89 69*  BUN 22 22  CREATININE 0.73 0.70  CALCIUM 8.8* 8.4*   PT/INR Recent Labs    01/01/20 0253  LABPROT 13.7  INR 1.1   CMP     Component Value Date/Time   NA 140 01/03/2020 0255   K 3.5 01/03/2020 0255   CL 105 01/03/2020 0255   CO2 24 01/03/2020 0255   GLUCOSE 69 (L) 01/03/2020 0255   BUN 22 01/03/2020 0255   CREATININE 0.70 01/03/2020 0255   CALCIUM 8.4 (L) 01/03/2020 0255   PROT 5.4 (L) 01/01/2020 0253   ALBUMIN 3.1 (L) 01/03/2020 0255   AST 19 01/01/2020 0253   ALT 16 01/01/2020 0253   ALKPHOS 57 01/01/2020 0253   BILITOT 0.8 01/01/2020 0253   GFRNONAA >60 01/03/2020 0255   GFRAA >60 01/03/2020 0255   Lipase     Component Value Date/Time   LIPASE 26 12/31/2019 1149        Studies/Results: DG Abd Portable 1V  Result Date: 01/03/2020 CLINICAL DATA:  Small bowel obstruction. EXAM: PORTABLE ABDOMEN - 1 VIEW COMPARISON:  01/02/2020 FINDINGS: 0556 hours. Film is overpenetrated. Interval decrease in diffuse gaseous small bowel distension with some residual distended central small bowel loops on today's exam. IMPRESSION: Interval decrease in the diffuse gaseous small bowel distention seen previously. Electronically Signed   By: Kennith Center M.D.   On: 01/03/2020 07:27   DG Abd Portable 1V  Result Date: 01/02/2020 CLINICAL DATA:  Recent bowel obstruction EXAM: PORTABLE ABDOMEN - 1 VIEW COMPARISON:  January 01, 2020 abdominal radiograph in December 31, 2019 CT abdomen and pelvis FINDINGS: There remain loops of dilated small bowel, slightly less than on 1 day prior. Scattered areas of air noted in colon. No free air. There are surgical clips in the right quadrant. Foci of arterial vascular calcification noted in the pelvis. Nasogastric tube no longer evident. IMPRESSION: Overall slightly less bowel dilatation compared to 1 day prior with air noted in colon and rectum. No free air. Clips right upper quadrant noted. Nasogastric tube no longer evident. Electronically Signed   By: Bretta Bang III M.D.   On: 01/02/2020 08:11    Anti-infectives: Anti-infectives (From admission, onward)  None     Assessment/Plan SBO  - surgical history: open Nissen fundoplication 80s, cholecystectomy, appendectomy, and most recently hernia repair approximately 5 years ago. - SB protocol 10/3  - NG accidentally dislodged then removed 10/3 - clinically improving. Start clear liquids and advance diet as tolerated to SOFT - CCS will follow, no acute surgical needs    LOS: 3 days    Hosie Spangle, [facility] Please see Amion for pager number during day hours 7:00am-4:30pm

## 2020-01-03 NOTE — Care Management Important Message (Signed)
Important Message  Patient Details IM Letter given to the Patient Name: Lee Nelson MRN: [id number] Date of Birth: [birth year]   Medicare Important Message Given:  Yes     Caren Macadam 01/03/2020, 11:17 AM

## 2020-01-03 NOTE — Plan of Care (Signed)
  Problem: Clinical Measurements: Goal: Will remain free from infection Outcome: Progressing   Problem: Clinical Measurements: Goal: Respiratory complications will improve Outcome: Progressing   Problem: Clinical Measurements: Goal: Cardiovascular complication will be avoided Outcome: Progressing   Problem: Nutrition: Goal: Adequate nutrition will be maintained Outcome: Progressing   Problem: Coping: Goal: Level of anxiety will decrease Outcome: Progressing   Problem: Skin Integrity: Goal: Risk for impaired skin integrity will decrease Outcome: Progressing

## 2020-01-04 DIAGNOSIS — K56609 Unspecified intestinal obstruction, unspecified as to partial versus complete obstruction: Principal | ICD-10-CM

## 2020-01-04 LAB — CBC
HCT: 36.7 % — ABNORMAL LOW (ref 39.0–52.0)
Hemoglobin: 12.1 g/dL — ABNORMAL LOW (ref 13.0–17.0)
MCH: 33.1 pg (ref 26.0–34.0)
MCHC: 33 g/dL (ref 30.0–36.0)
MCV: 100.3 fL — ABNORMAL HIGH (ref 80.0–100.0)
Platelets: 120 10*3/uL — ABNORMAL LOW (ref 150–400)
RBC: 3.66 MIL/uL — ABNORMAL LOW (ref 4.22–5.81)
RDW: 13.1 % (ref 11.5–15.5)
WBC: 5.3 10*3/uL (ref 4.0–10.5)
nRBC: 0 % (ref 0.0–0.2)

## 2020-01-04 LAB — RENAL FUNCTION PANEL
Albumin: 2.9 g/dL — ABNORMAL LOW (ref 3.5–5.0)
Anion gap: 8 (ref 5–15)
BUN: 9 mg/dL (ref 8–23)
CO2: 26 mmol/L (ref 22–32)
Calcium: 8.3 mg/dL — ABNORMAL LOW (ref 8.9–10.3)
Chloride: 107 mmol/L (ref 98–111)
Creatinine, Ser: 0.62 mg/dL (ref 0.61–1.24)
GFR calc non Af Amer: >60 mL/min (ref >60)
Glucose, Bld: 97 mg/dL (ref 70–99)
Phosphorus: 2.3 mg/dL — ABNORMAL LOW (ref 2.5–4.6)
Potassium: 3.7 mmol/L (ref 3.5–5.1)
Sodium: 141 mmol/L (ref 135–145)

## 2020-01-04 LAB — MAGNESIUM: Magnesium: 2 mg/dL (ref 1.7–2.4)

## 2020-01-04 LAB — GLUCOSE, CAPILLARY: Glucose-Capillary: 86 mg/dL (ref 70–99)

## 2020-01-04 MED ORDER — GLYCERIN (LAXATIVE) 2.1 G RE SUPP
1.0000 | Freq: Every day | RECTAL | Status: DC | PRN
  Filled 2020-01-04: qty 1

## 2020-01-04 MED ORDER — SODIUM CHLORIDE 0.9% FLUSH: 3.0000 mL | INTRAVENOUS | Status: DC | PRN

## 2020-01-04 MED ORDER — POTASSIUM & SODIUM PHOSPHATES 280-160-250 MG PO PACK
1.0000 | PACK | Freq: Three times a day (TID) | ORAL | Status: DC
  Administered 2020-01-04 – 2020-01-05 (×5): 1 via ORAL
  Filled 2020-01-04 (×6): qty 1

## 2020-01-04 MED ORDER — DOCUSATE SODIUM 50 MG PO CAPS
50.0000 mg | ORAL_CAPSULE | Freq: Every day | ORAL | Status: DC
  Administered 2020-01-04: 50 mg via ORAL
  Filled 2020-01-04: qty 1

## 2020-01-04 MED ORDER — SODIUM CHLORIDE 0.9 % IV SOLN: 250.0000 mL | INTRAVENOUS | Status: DC | PRN

## 2020-01-04 MED ORDER — SODIUM CHLORIDE 0.9% FLUSH
3.0000 mL | Freq: Two times a day (BID) | INTRAVENOUS | Status: DC
  Administered 2020-01-04 (×2): 3 mL via INTRAVENOUS

## 2020-01-04 MED ORDER — POLYETHYLENE GLYCOL 3350 17 G PO PACK: 17.0000 g | PACK | Freq: Every day | ORAL | Status: DC | PRN

## 2020-01-04 NOTE — Progress Notes (Signed)
[facility] Progress Note     Subjective: Patient reports he had some bloating overnight but it feels better this AM. Still having bowel function and tolerated CLD yesterday. Hopeful to get home soon.   Objective: Vital signs in last 24 hours: Temp:  [97.7 F (36.5 C)-98.4 F (36.9 C)] 98.4 F (36.9 C) (10/05 2133) Pulse Rate:  [60-64] 64 (10/05 2133) Resp:  [15-18] 15 (10/05 2133) BP: (112-123)/(54-66) 123/66 (10/05 2133) SpO2:  [99 %-100 %] 99 % (10/05 2133) Last BM Date: 01/03/20  Intake/Output from previous day: 10/05 0701 - 10/06 0700 In: 3266 [P.O.:1890; I.V.:1376] Out: 950 [Urine:950] Intake/Output this shift: Total I/O In: 325 [I.V.:325] Out: -   PE: General: pleasant, WD, elderly male who is sitting up in NAD Lungs: Respiratory effort nonlabored Abd: soft, NT, mildly distended, +BS MS: all 4 extremities are symmetrical with no cyanosis, clubbing, or edema. Skin: warm and dry with no masses, lesions, or rashes Psych: A&Ox3 with an appropriate affect.   Lab Results:  Recent Labs    01/03/20 0255 01/04/20 0327  WBC 4.8 5.3  HGB 11.7* 12.1*  HCT 35.0* 36.7*  PLT 122* 120*   BMET Recent Labs    01/03/20 0255 01/04/20 0327  NA 140 141  K 3.5 3.7  CL 105 107  CO2 24 26  GLUCOSE 69* 97  BUN 22 9  CREATININE 0.70 0.62  CALCIUM 8.4* 8.3*   PT/INR No results for input(s): LABPROT, INR in the last 72 hours. CMP     Component Value Date/Time   NA 141 01/04/2020 0327   K 3.7 01/04/2020 0327   CL 107 01/04/2020 0327   CO2 26 01/04/2020 0327   GLUCOSE 97 01/04/2020 0327   BUN 9 01/04/2020 0327   CREATININE 0.62 01/04/2020 0327   CALCIUM 8.3 (L) 01/04/2020 0327   PROT 5.4 (L) 01/01/2020 0253   ALBUMIN 2.9 (L) 01/04/2020 0327   AST 19 01/01/2020 0253   ALT 16 01/01/2020 0253   ALKPHOS 57 01/01/2020 0253   BILITOT 0.8 01/01/2020 0253   GFRNONAA >60 01/04/2020 0327   GFRAA >60 01/03/2020 0255   Lipase     Component Value Date/Time    LIPASE 26 12/31/2019 1149       Studies/Results: DG Abd Portable 1V  Result Date: 01/03/2020 CLINICAL DATA:  Small bowel obstruction. EXAM: PORTABLE ABDOMEN - 1 VIEW COMPARISON:  01/02/2020 FINDINGS: 0556 hours. Film is overpenetrated. Interval decrease in diffuse gaseous small bowel distension with some residual distended central small bowel loops on today's exam. IMPRESSION: Interval decrease in the diffuse gaseous small bowel distention seen previously. Electronically Signed   By: Kennith Center M.D.   On: 01/03/2020 07:27    Anti-infectives: Anti-infectives (From admission, onward)   None       Assessment/Plan SBO  - surgical history: open Nissen fundoplication 80s, cholecystectomy, appendectomy, and most recently hernia repair approximately 5 years ago. - SB protocol 10/3  - NG accidentally dislodged then removed 10/3 - clinically improving. Advance to soft diet at lunch today and discharge home tomorrow if tolerating - added colace QHS and prn miralax - CCS will follow, no acute surgical needs   FEN: Soft diet, SLIV VTE: lovenox ID: no current abx  LOS: 4 days    Juliet Rude , [facility] 01/04/2020, 8:26 AM Please see Amion for pager number during day hours 7:00am-4:30pm

## 2020-01-04 NOTE — Progress Notes (Signed)
PROGRESS NOTE    Lee Nelson  NGE:[id number] DOB: 01/22/1941 DOA: 12/31/2019 PCP: Chilton Greathouse, MD   Chief Complaint  Patient presents with  . Abdominal Distention   Brief Narrative: Per previous attending:  79 y.o.malewith history ofchronic constipation followed by GI (Dr. Dulce Sellar), chronic thrombocytopenia, BPH with LUTS, anxiety, osteoarthritis and multiple abdominal surgeries in the past presenting with abdominal pain, bloating and not passing gas, and found to have small bowel obstruction.  In ED, CT abdomen and pelvis revealed SBO with transition point of terminal ileum and central abdomen, and moderate volume ascites thought to be reactive.  General surgery consulted.  NG tube placed and SBO protocol initiated.  NG tube accidentally dislodged on 01/01/2020. Had bowel movements and flatus.  No nausea or emesis.  Started on clear liquid diet.  General surgery following 10/6-diet changed to salt  Subjective: Some confusion overnight per nursing staff patient removed IV line But patient reports time during the night he reports he can walk on the He was trying to get up to open the door at that time the IV line came out.  Normally at home reports he had home health care coming to check on his wife and he usually opens door for them.   Is alert awake oriented x3 this morning.  Reports he had large bowel movement, he has been ambulatory in the hallway. Afebrile.  Labs stable.  Assessment & Plan:  SBO: Appreciate surgical input on board.Surgery following closely resolving, diet increased to soft diet.  Increase activity, IV fluids being discontinued.  Switch IV to p.o. pain meds as needed.  Continue plan as per surgery.  Moderate abdominal ascites thought to be reactive due to #!, cont supportive measures  Metabolic acidosis 2/2 #1 resolved  Dehydration due to bowel obstruction, now on diet oral diet stop IV fluids  Normocytic anemia hemoglobin dropped some since  admission likely dilutional.  Monitor Recent Labs  Lab 12/31/19 1149 01/01/20 0253 01/03/20 0255 01/04/20 0327  HGB 15.5 14.3 11.7* 12.1*  HCT 45.1 41.8 35.0* 36.7*   Anxiety disorder on as needed Xanax on home meds.-resume  BPH with intermittent dysuria not on meds.  Monitor  Thrombocytopenia chronic monitor  Severe malnourishment/underweight BMI 17: Augment diet as tolerated as bowel obstruction resolves  Chronic pain/osteoarthritis: Pain control with p.o. meds  Diet Order            DIET SOFT Room service appropriate? Yes; Fluid consistency: Thin  Diet effective 1000           Diet full liquid Room service appropriate? Yes; Fluid consistency: Thin  Diet effective now                Body mass index is 17.23 kg/m.  DVT prophylaxis: enoxaparin (LOVENOX) injection 40 mg Start: 01/02/20 1600 SCDs Start: 12/31/19 1734 Code Status:   Code Status: Full Code  Family Communication: plan of care discussed with patient at bedside.  Status is: Inpatient Remains inpatient appropriate because:Inpatient level of care appropriate due to severity of illness and For ongoing management of bowel obstruction, monitoring of diet tolerance  Dispo: The patient is from: Home              Anticipated d/c is to: Home              Anticipated d/c date is: 1 day              Patient currently is not medically stable to d/c.  Consultants:see  note  Procedures:see note  Culture/Microbiology No results found for: SDES, SPECREQUEST, CULT, REPTSTATUS  Other culture-see note  Medications: Scheduled Meds: . docusate sodium  50 mg Oral QHS  . enoxaparin (LOVENOX) injection  40 mg Subcutaneous Q24H  . lip balm  1 application Topical BID  . ondansetron (ZOFRAN) IV  4 mg Intravenous Once  . potassium & sodium phosphates  1 packet Oral TID WC & HS  . sodium chloride flush  3 mL Intravenous Q12H   Continuous Infusions: . sodium chloride    . methocarbamol (ROBAXIN) IV    . ondansetron  (ZOFRAN) IV      Antimicrobials: Anti-infectives (From admission, onward)   None     Objective: Vitals: Today's Vitals   01/03/20 1319 01/03/20 1946 01/03/20 2133 01/04/20 0825  BP: (!) 112/54  123/66   Pulse: 60  64   Resp: 18  15   Temp: 97.7 F (36.5 C)  98.4 F (36.9 C)   TempSrc: Oral  Oral   SpO2: 100%  99%   Weight:      Height:      PainSc:  0-No pain  0-No pain    Intake/Output Summary (Last 24 hours) at 01/04/2020 0904 Last data filed at 01/04/2020 0825 Gross per 24 hour  Intake 3591.02 ml  Output 950 ml  Net 2641.02 ml   Filed Weights   12/31/19 1220  Weight: 49.9 kg   Weight change:   Intake/Output from previous day: 10/05 0701 - 10/06 0700 In: 3266 [P.O.:1890; I.V.:1376] Out: 950 [Urine:950] Intake/Output this shift: Total I/O In: 325 [I.V.:325] Out: -   Examination: General exam: AA AND ORIENTED AT BASELINE NOT IN ACUTE DISTRESS, ON ROOM AIR  HEENT:Oral mucosa moist, Ear/Nose WNL grossly,dentition normal. Respiratory system: bilaterally clear,no wheezing or crackles,no use of accessory muscle, non tender. Cardiovascular system: S1 & S2 +, regular, No JVD. Gastrointestinal system: Abdomen soft, NT,ND, BS+. Nervous System:Alert, awake, moving extremities and grossly nonfocal Extremities: No edema, distal peripheral pulses palpable.  Skin: No rashes,no icterus. MSK: Normal muscle bulk,tone, power  Data Reviewed: I have personally reviewed following labs and imaging studies CBC: Recent Labs  Lab 12/31/19 1149 01/01/20 0253 01/03/20 0255 01/04/20 0327  WBC 8.3 9.4 4.8 5.3  NEUTROABS 7.3  --   --   --   HGB 15.5 14.3 11.7* 12.1*  HCT 45.1 41.8 35.0* 36.7*  MCV 98.3 97.9 98.3 100.3*  PLT 100* 144* 122* 120*   Basic Metabolic Panel: Recent Labs  Lab 12/31/19 1149 01/01/20 0253 01/02/20 0356 01/03/20 0255 01/04/20 0327  NA 137 138 140 140 141  K 4.3 4.2 3.8 3.5 3.7  CL 102 104 104 105 107  CO2 21* 25 28 24 26   GLUCOSE 106* 119* 89  69* 97  BUN 18 19 22 22 9   CREATININE 0.56* 0.66 0.73 0.70 0.62  CALCIUM 9.3 8.7* 8.8* 8.4* 8.3*  MG  --   --  2.0 2.0 2.0  PHOS  --   --  2.6 2.8 2.3*   GFR: Estimated Creatinine Clearance: 52.8 mL/min (by C-G formula based on SCr of 0.62 mg/dL). Liver Function Tests: Recent Labs  Lab 12/31/19 1149 01/01/20 0253 01/02/20 0356 01/03/20 0255 01/04/20 0327  AST 22 19  --   --   --   ALT 22 16  --   --   --   ALKPHOS 72 57  --   --   --   BILITOT 0.9 0.8  --   --   --  PROT 6.4* 5.4*  --   --   --   ALBUMIN 4.0 3.4* 3.2* 3.1* 2.9*   Recent Labs  Lab 12/31/19 1149  LIPASE 26   No results for input(s): AMMONIA in the last 168 hours. Coagulation Profile: Recent Labs  Lab 01/01/20 0253  INR 1.1   Cardiac Enzymes: No results for input(s): CKTOTAL, CKMB, CKMBINDEX, TROPONINI in the last 168 hours. BNP (last 3 results) No results for input(s): PROBNP in the last 8760 hours. HbA1C: No results for input(s): HGBA1C in the last 72 hours. CBG: Recent Labs  Lab 01/03/20 0731 01/03/20 0809 01/04/20 0004  GLUCAP 63* 82 86   Lipid Profile: No results for input(s): CHOL, HDL, LDLCALC, TRIG, CHOLHDL, LDLDIRECT in the last 72 hours. Thyroid Function Tests: No results for input(s): TSH, T4TOTAL, FREET4, T3FREE, THYROIDAB in the last 72 hours. Anemia Panel: No results for input(s): VITAMINB12, FOLATE, FERRITIN, TIBC, IRON, RETICCTPCT in the last 72 hours. Sepsis Labs: No results for input(s): PROCALCITON, LATICACIDVEN in the last 168 hours.  Recent Results (from the past 240 hour(s))  Respiratory Panel by RT PCR (Flu A&B, Covid) - Nasopharyngeal Swab     Status: None   Collection Time: 12/31/19  4:40 PM   Specimen: Nasopharyngeal Swab  Result Value Ref Range Status   SARS Coronavirus 2 by RT PCR NEGATIVE NEGATIVE Final    Comment: (NOTE) SARS-CoV-2 target nucleic acids are NOT DETECTED.  The SARS-CoV-2 RNA is generally detectable in upper respiratoy specimens during the  acute phase of infection. The lowest concentration of SARS-CoV-2 viral copies this assay can detect is 131 copies/mL. A negative result does not preclude SARS-Cov-2 infection and should not be used as the sole basis for treatment or other patient management decisions. A negative result may occur with  improper specimen collection/handling, submission of specimen other than nasopharyngeal swab, presence of viral mutation(s) within the areas targeted by this assay, and inadequate number of viral copies (<131 copies/mL). A negative result must be combined with clinical observations, patient history, and epidemiological information. The expected result is Negative.  Fact Sheet for Patients:  https://www.moore.com/  Fact Sheet for Healthcare Providers:  https://www.young.biz/  This test is no t yet approved or cleared by the [place] FDA and  has been authorized for detection and/or diagnosis of SARS-CoV-2 by FDA under an Emergency Use Authorization (EUA). This EUA will remain  in effect (meaning this test can be used) for the duration of the COVID-19 declaration under Section 564(b)(1) of the Act, 21 U.S.C. section 360bbb-3(b)(1), unless the authorization is terminated or revoked sooner.     Influenza A by PCR NEGATIVE NEGATIVE Final   Influenza B by PCR NEGATIVE NEGATIVE Final    Comment: (NOTE) The Xpert Xpress SARS-CoV-2/FLU/RSV assay is intended as an aid in  the diagnosis of influenza from Nasopharyngeal swab specimens and  should not be used as a sole basis for treatment. Nasal washings and  aspirates are unacceptable for Xpert Xpress SARS-CoV-2/FLU/RSV  testing.  Fact Sheet for Patients: https://www.moore.com/  Fact Sheet for Healthcare Providers: https://www.young.biz/  This test is not yet approved or cleared by the [place] FDA and  has been authorized for detection and/or diagnosis  of SARS-CoV-2 by  FDA under an Emergency Use Authorization (EUA). This EUA will remain  in effect (meaning this test can be used) for the duration of the  Covid-19 declaration under Section 564(b)(1) of the Act, 21  U.S.C. section 360bbb-3(b)(1), unless the authorization is  terminated or  revoked. Performed at [facility], [address] [place], [place] [address]      Radiology Studies: DG Abd Portable 1V  Result Date: 01/03/2020 CLINICAL DATA:  Small bowel obstruction. EXAM: PORTABLE ABDOMEN - 1 VIEW COMPARISON:  01/02/2020 FINDINGS: 0556 hours. Film is overpenetrated. Interval decrease in diffuse gaseous small bowel distension with some residual distended central small bowel loops on today's exam. IMPRESSION: Interval decrease in the diffuse gaseous small bowel distention seen previously. Electronically Signed   By: Kennith Center M.D.   On: 01/03/2020 07:27     LOS: 4 days   Lanae Boast, MD [facility]  01/04/2020, 9:04 AM

## 2020-01-04 NOTE — Progress Notes (Signed)
Pt confused and pulled out iv. Pt stated he forgot where he was and heard a knock on the door. He was trying to get to the door but the iv got in his way. Pt remains confused. Blood sugar normal at 86. Rn will continue to monitor.

## 2020-01-05 LAB — GLUCOSE, CAPILLARY: Glucose-Capillary: 94 mg/dL (ref 70–99)

## 2020-01-05 MED ORDER — POTASSIUM & SODIUM PHOSPHATES 280-160-250 MG PO PACK: 1.0000 | PACK | Freq: Three times a day (TID) | ORAL | 0 refills | Status: AC

## 2020-01-05 MED ORDER — DOCUSATE SODIUM 50 MG PO CAPS
50.0000 mg | ORAL_CAPSULE | Freq: Every day | ORAL | 0 refills | Status: DC
Start: 2020-01-05 — End: 2022-05-15

## 2020-01-05 NOTE — Discharge Summary (Signed)
Physician Discharge Summary  Lee Nelson NTZ:[id number] DOB: 08/13/1940 DOA: 12/31/2019  PCP: Chilton Greathouse, MD  Admit date: 12/31/2019 Discharge date: 01/05/2020  Admitted From: home Disposition:  home  Recommendations for Outpatient Follow-up:  1. Follow up with PCP in 1-2 weeks 2. Please obtain BMP/CBC in one week 3. Please follow up on the following pending results:  Home Health:no  Equipment/Devices: none  Discharge Condition: Stable Code Status:   Code Status: Full Code Diet recommendation:  Diet Order            DIET SOFT Room service appropriate? Yes; Fluid consistency: Thin  Diet effective 1000                  Brief/Interim Summary:  79 y.o.malewith history ofchronic constipation followed by GI (Dr. Dulce Sellar), chronic thrombocytopenia, BPH with LUTS, anxiety, osteoarthritis and multiple abdominal surgeries in the past presenting with abdominal pain, bloating and not passing gas, and found to have small bowel obstruction.  In ED, CT abdomen and pelvis revealed SBO with transition point of terminal ileum and central abdomen, and moderate volume ascites thought to be reactive. General surgery consulted. NG tube placed and SBO protocol initiated.  NG tube accidentally dislodged on 01/01/2020. Had bowel movements and flatus. No nausea or emesis.Started on clear liquid diet. General surgery following 10/6-diet changed to soft diet. Patient has been ambulating without issues.  Discharge Diagnoses:  SBO: Resolved.  Managed conservatively by surgery.  Tolerating diet. Cont colace. Moderate abdominal ascites thought to be reactive due to #!, cont supportive measures.  Advise outpatient follow-up Metabolic acidosis 2/2 #1 resolved Dehydration due to bowel obstruction, now on diet oral diet stop IV fluids Normocytic anemia hemoglobin dropped some since admission likely dilutional.  Monitor Last Labs         Recent Labs  Lab 12/31/19 1149 01/01/20 0253  01/03/20 0255 01/04/20 0327  HGB 15.5 14.3 11.7* 12.1*  HCT 45.1 41.8 35.0* 36.7*     Anxiety disorder on as needed Xanax on home meds.-resume  BPH with intermittent dysuria not on meds.  Monitor  Thrombocytopenia chronic monitor  Severe malnourishment/underweight BMI 17: Augment diet as tolerated as bowel obstruction resolves  Chronic pain/osteoarthritis: Pain control with p.o. meds  Consults:  CCS  Subjective: Doing well, passing gas. Is dressed and ready to go home this am  Discharge Exam: Vitals:   01/04/20 2127 01/05/20 0509  BP: (!) 140/53 (!) 118/59  Pulse: 61 60  Resp: 18 18  Temp: 98.6 F (37 C) 98.2 F (36.8 C)  SpO2: 97% 98%   General: Pt is alert, awake, not in acute distress Cardiovascular: RRR, S1/S2 +, no rubs, no gallops Respiratory: CTA bilaterally, no wheezing, no rhonchi Abdominal: Soft, NT, ND, bowel sounds + Extremities: no edema, no cyanosis  Discharge Instructions  Discharge Instructions    Discharge instructions   Complete by: As directed    Please call call MD or return to ER for similar or worsening recurring problem that brought you to hospital or if any fever,nausea/vomiting,abdominal pain, uncontrolled pain, chest pain,  shortness of breath or any other alarming symptoms.  Please follow-up your doctor as instructed in a week time and call the office for appointment.  Please avoid alcohol, smoking, or any other illicit substance and maintain healthy habits including taking your regular medications as prescribed.  You were cared for by a hospitalist during your hospital stay. If you have any questions about your discharge medications or the care you received while  you were in the hospital after you are discharged, you can call the unit and ask to speak with the hospitalist on call if the hospitalist that took care of you is not available.  Once you are discharged, your primary care physician will handle any further medical issues.  Please note that NO REFILLS for any discharge medications will be authorized once you are discharged, as it is imperative that you return to your primary care physician (or establish a relationship with a primary care physician if you do not have one) for your aftercare needs so that they can reassess your need for medications and monitor your lab values   Discharge wound care:   Complete by: As directed    Routine care, pressure off loading   Increase activity slowly   Complete by: As directed      Allergies as of 01/05/2020      Reactions   Ciprofloxacin    JOINT PAIN   Flagyl [metronidazole] Other (See Comments)   REACTION: no appetite, diarrhea after meal, decrease in weight   Metoclopramide Hcl Other (See Comments)   REACTION: "involuntary movements"   Other Other (See Comments)   Antibiotics have unknown reaction propophol causes memory problems   Septra [sulfamethoxazole-trimethoprim] Other (See Comments)   REACTION: "involuntary movements" tripac antibiotic- heart rythm   Silodosin    ? Possibly allergy, could not breathe well out of nose   Soy Allergy Other (See Comments)   Stomach upset      Medication List    TAKE these medications   ALPRAZolam 0.25 MG tablet Commonly known as: XANAX Take 0.125 mg by mouth 2 (two) times daily as needed for anxiety or sleep.   Cranberry 400 MG Caps Take 400-1,200 mg by mouth every morning.   Curcumin Powd Take 220 mg by mouth daily as needed (suppliment).   docusate sodium 50 MG capsule Commonly known as: COLACE Take 1 capsule (50 mg total) by mouth at bedtime.   L-Tyrosine 500 MG Caps Take 500 mg by mouth daily.   MAGNESIUM CITRATE PO Take 75 mg by mouth daily.   Melatonin 2.5 MG Caps Take 1.25 mg by mouth at bedtime as needed (sleep).   Milk Thistle 175 MG Caps Take 1 capsule by mouth daily.   NON FORMULARY Take 1 capsule by mouth daily. Acetyl L carnitine   OVER THE COUNTER MEDICATION Take 3 capsules by mouth 2  (two) times daily. Lion's mane 0.5 gram/capsule   OVER THE COUNTER MEDICATION Take 2 capsules by mouth every morning. Reparagen supplement   Phosphatidylserine 100 MG Caps Take 100 mg by mouth every morning.   potassium & sodium phosphates 280-160-250 MG Pack Commonly known as: PHOS-NAK Take 1 packet by mouth 4 (four) times daily -  with meals and at bedtime for 3 days.   PROBIOTIC DAILY PO Take 1 capsule by mouth daily.   sodium chloride 5 % ophthalmic ointment Commonly known as: MURO 128 Place 1 application into both eyes at bedtime.   Systane Balance 0.6 % Soln Generic drug: Propylene Glycol Apply 1 drop to eye daily as needed (dry eyes).   THEANINE PO Take 2 tablets by mouth 2 (two) times daily.   vitamin C 250 MG tablet Commonly known as: ASCORBIC ACID Take 250 mg by mouth daily.   Vitamin D-3 25 MCG (1000 UT) Caps Take 2,000 Units by mouth daily.   VITAMIN K2 PO Take 120-240 mg by mouth daily. Combo w/ Vit. D  Discharge Care Instructions  (From admission, onward)         Start     Ordered   01/05/20 0000  Discharge wound care:       Comments: Routine care, pressure off loading   01/05/20 0857          Follow-up Information    Avva, Ravisankar, MD Follow up in 1 week(s).   Specialty: Internal Medicine Contact information: [address] [place] [address] [phone]        Chrystie Nose, MD .   Specialty: Cardiology Contact information: [address] [place] [address] [phone]              Allergies  Allergen Reactions  . Ciprofloxacin     JOINT PAIN  . Flagyl [Metronidazole] Other (See Comments)    REACTION: no appetite, diarrhea after meal, decrease in weight  . Metoclopramide Hcl Other (See Comments)    REACTION: "involuntary movements"  . Other Other (See Comments)    Antibiotics have unknown reaction propophol causes memory problems  . Septra [Sulfamethoxazole-Trimethoprim]  Other (See Comments)    REACTION: "involuntary movements" tripac antibiotic- heart rythm  . Silodosin     ? Possibly allergy, could not breathe well out of nose  . Soy Allergy Other (See Comments)    Stomach upset    The results of significant diagnostics from this hospitalization (including imaging, microbiology, ancillary and laboratory) are listed below for reference.    Microbiology: Recent Results (from the past 240 hour(s))  Respiratory Panel by RT PCR (Flu A&B, Covid) - Nasopharyngeal Swab     Status: None   Collection Time: 12/31/19  4:40 PM   Specimen: Nasopharyngeal Swab  Result Value Ref Range Status   SARS Coronavirus 2 by RT PCR NEGATIVE NEGATIVE Final    Comment: (NOTE) SARS-CoV-2 target nucleic acids are NOT DETECTED.  The SARS-CoV-2 RNA is generally detectable in upper respiratoy specimens during the acute phase of infection. The lowest concentration of SARS-CoV-2 viral copies this assay can detect is 131 copies/mL. A negative result does not preclude SARS-Cov-2 infection and should not be used as the sole basis for treatment or other patient management decisions. A negative result may occur with  improper specimen collection/handling, submission of specimen other than nasopharyngeal swab, presence of viral mutation(s) within the areas targeted by this assay, and inadequate number of viral copies (<131 copies/mL). A negative result must be combined with clinical observations, patient history, and epidemiological information. The expected result is Negative.  Fact Sheet for Patients:  https://www.moore.com/  Fact Sheet for Healthcare Providers:  https://www.young.biz/  This test is no t yet approved or cleared by the [place] FDA and  has been authorized for detection and/or diagnosis of SARS-CoV-2 by FDA under an Emergency Use Authorization (EUA). This EUA will remain  in effect (meaning this test can be used) for  the duration of the COVID-19 declaration under Section 564(b)(1) of the Act, 21 U.S.C. section 360bbb-3(b)(1), unless the authorization is terminated or revoked sooner.     Influenza A by PCR NEGATIVE NEGATIVE Final   Influenza B by PCR NEGATIVE NEGATIVE Final    Comment: (NOTE) The Xpert Xpress SARS-CoV-2/FLU/RSV assay is intended as an aid in  the diagnosis of influenza from Nasopharyngeal swab specimens and  should not be used as a sole basis for treatment. Nasal washings and  aspirates are unacceptable for Xpert Xpress SARS-CoV-2/FLU/RSV  testing.  Fact Sheet for Patients: https://www.moore.com/  Fact Sheet  for Healthcare Providers: https://www.young.biz/  This test is not yet approved or cleared by the [place] and  has been authorized for detection and/or diagnosis of SARS-CoV-2 by  FDA under an Emergency Use Authorization (EUA). This EUA will remain  in effect (meaning this test can be used) for the duration of the  Covid-19 declaration under Section 564(b)(1) of the Act, 21  U.S.C. section 360bbb-3(b)(1), unless the authorization is  terminated or revoked. Performed at [facility], [address], [place], [place] [address]     Procedures/Studies: CT Abdomen Pelvis W Contrast  Result Date: 12/31/2019 CLINICAL DATA:  Abdominal distension, concern for small bowel obstruction EXAM: CT ABDOMEN AND PELVIS WITH CONTRAST TECHNIQUE: Multidetector CT imaging of the abdomen and pelvis was performed using the standard protocol following bolus administration of intravenous contrast. CONTRAST:  OMNIPAQUE IOHEXOL 300 MG/ML  SOLN COMPARISON:  11/11/2019 FINDINGS: Lower chest: No acute abnormality. Hepatobiliary: Fluid attenuation cyst of the left lobe of the liver. Additional subcentimeter lesions of the liver too small to characterize although very likely small cysts and unchanged compared to prior. Status post  cholecystectomy. Postoperative biliary dilatation. Pancreas: Unremarkable. No pancreatic ductal dilatation or surrounding inflammatory changes. Spleen: Normal in size without significant abnormality. Adrenals/Urinary Tract: Adrenal glands are unremarkable. Kidneys are normal, without renal calculi, solid lesion, or hydronephrosis. Bladder is unremarkable. Stomach/Bowel: Stomach is within normal limits. The small bowel is diffusely air and fluid distended, with a knuckled transition point of the terminal ileum in the central abdomen (series 2, image 55). There is scattered gas and stool throughout the colon to the rectum. Sigmoid diverticulosis. Vascular/Lymphatic: Aortic atherosclerosis. No enlarged abdominal or pelvic lymph nodes. Reproductive: No mass or other significant abnormality. Other: No abdominal wall hernia or abnormality. Moderate volume ascites throughout the abdomen and pelvis. Musculoskeletal: No acute or significant osseous findings. IMPRESSION: 1. The small bowel is diffusely air and fluid distended, with a knuckled transition point of the terminal ileum in the central abdomen. There is is scattered gas and stool throughout the colon to the rectum. Findings are consistent with distal small bowel obstruction, which may be incomplete or developing given the presence of gas and stool in the colon. 2. Moderate volume ascites throughout the abdomen and pelvis, likely reactive. 3. Aortic Atherosclerosis (ICD10-I70.0). Electronically Signed   By: Lauralyn Primes M.D.   On: 12/31/2019 16:18   DG Abd Portable 1V  Result Date: 01/03/2020 CLINICAL DATA:  Small bowel obstruction. EXAM: PORTABLE ABDOMEN - 1 VIEW COMPARISON:  01/02/2020 FINDINGS: 0556 hours. Film is overpenetrated. Interval decrease in diffuse gaseous small bowel distension with some residual distended central small bowel loops on today's exam. IMPRESSION: Interval decrease in the diffuse gaseous small bowel distention seen previously.  Electronically Signed   By: Kennith Center M.D.   On: 01/03/2020 07:27   DG Abd Portable 1V  Result Date: 01/02/2020 CLINICAL DATA:  Recent bowel obstruction EXAM: PORTABLE ABDOMEN - 1 VIEW COMPARISON:  January 01, 2020 abdominal radiograph in December 31, 2019 CT abdomen and pelvis FINDINGS: There remain loops of dilated small bowel, slightly less than on 1 day prior. Scattered areas of air noted in colon. No free air. There are surgical clips in the right quadrant. Foci of arterial vascular calcification noted in the pelvis. Nasogastric tube no longer evident. IMPRESSION: Overall slightly less bowel dilatation compared to 1 day prior with air noted in colon and rectum. No free air. Clips right upper quadrant noted. Nasogastric tube no  longer evident. Electronically Signed   By: Bretta BangWilliam  Woodruff III M.D.   On: 01/02/2020 08:11   DG Abd Portable 1V-Small Bowel Obstruction Protocol-initial, 8 hr delay  Result Date: 01/01/2020 CLINICAL DATA:  Small bowel obstruction, 8 hour delay radiograph, abdominal cramping EXAM: PORTABLE ABDOMEN - 1 VIEW COMPARISON:  12/31/2019 abdominal radiograph and CT abdomen/pelvis. FINDINGS: Enteric tube terminates in the proximal stomach. Cholecystectomy clips are seen in the right upper quadrant of the abdomen. Moderate diffuse small bowel dilatation up to 4.3 cm diameter, not substantially changed. Enteric contrast pools in the gastric fundus. Mild colonic stool. No evidence of pneumatosis or pneumoperitoneum. Excreted contrast in the urinary bladder. IMPRESSION: 1. Persistent moderate diffuse small bowel dilatation, not substantially changed, compatible with distal small obstruction. 2. Enteric contrast pools in the gastric fundus. Enteric tube terminates in the proximal stomach. Electronically Signed   By: Delbert PhenixJason A Poff M.D.   On: 01/01/2020 07:48   DG Abd Portable 1V-Small Bowel Protocol-Position Verification  Result Date: 12/31/2019 CLINICAL DATA:  NG tube placement EXAM:  PORTABLE ABDOMEN - 1 VIEW COMPARISON:  None. FINDINGS: Dilated loops of small bowel visible within the upper abdomen. There is excreted contrast material in the renal collecting systems. No radio-opaque calculi or other significant radiographic abnormality are seen. Side port and tip of the NG tube are in the stomach. IMPRESSION: NG tube tip in the stomach. Electronically Signed   By: Deatra RobinsonKevin  Herman M.D.   On: 12/31/2019 21:34    Labs: BNP (last 3 results) No results for input(s): BNP in the last 8760 hours. Basic Metabolic Panel: Recent Labs  Lab 12/31/19 1149 01/01/20 0253 01/02/20 0356 01/03/20 0255 01/04/20 0327  NA 137 138 140 140 141  K 4.3 4.2 3.8 3.5 3.7  CL 102 104 104 105 107  CO2 21* 25 28 24 26   GLUCOSE 106* 119* 89 69* 97  BUN 18 19 22 22 9   CREATININE 0.56* 0.66 0.73 0.70 0.62  CALCIUM 9.3 8.7* 8.8* 8.4* 8.3*  MG  --   --  2.0 2.0 2.0  PHOS  --   --  2.6 2.8 2.3*   Liver Function Tests: Recent Labs  Lab 12/31/19 1149 01/01/20 0253 01/02/20 0356 01/03/20 0255 01/04/20 0327  AST 22 19  --   --   --   ALT 22 16  --   --   --   ALKPHOS 72 57  --   --   --   BILITOT 0.9 0.8  --   --   --   PROT 6.4* 5.4*  --   --   --   ALBUMIN 4.0 3.4* 3.2* 3.1* 2.9*   Recent Labs  Lab 12/31/19 1149  LIPASE 26   No results for input(s): AMMONIA in the last 168 hours. CBC: Recent Labs  Lab 12/31/19 1149 01/01/20 0253 01/03/20 0255 01/04/20 0327  WBC 8.3 9.4 4.8 5.3  NEUTROABS 7.3  --   --   --   HGB 15.5 14.3 11.7* 12.1*  HCT 45.1 41.8 35.0* 36.7*  MCV 98.3 97.9 98.3 100.3*  PLT 100* 144* 122* 120*   Cardiac Enzymes: No results for input(s): CKTOTAL, CKMB, CKMBINDEX, TROPONINI in the last 168 hours. BNP: Invalid input(s): POCBNP CBG: Recent Labs  Lab 01/03/20 0731 01/03/20 0809 01/04/20 0004 01/05/20 0736  GLUCAP 63* 82 86 94   D-Dimer No results for input(s): DDIMER in the last 72 hours. Hgb A1c No results for input(s): HGBA1C in the last 72  hours.  Lipid Profile No results for input(s): CHOL, HDL, LDLCALC, TRIG, CHOLHDL, LDLDIRECT in the last 72 hours. Thyroid function studies No results for input(s): TSH, T4TOTAL, T3FREE, THYROIDAB in the last 72 hours.  Invalid input(s): FREET3 Anemia work up No results for input(s): VITAMINB12, FOLATE, FERRITIN, TIBC, IRON, RETICCTPCT in the last 72 hours. Urinalysis    Component Value Date/Time   COLORURINE YELLOW 12/31/2019 1149   APPEARANCEUR CLEAR 12/31/2019 1149   LABSPEC 1.025 12/31/2019 1149   PHURINE 5.0 12/31/2019 1149   GLUCOSEU NEGATIVE 12/31/2019 1149   HGBUR NEGATIVE 12/31/2019 1149   BILIRUBINUR NEGATIVE 12/31/2019 1149   KETONESUR 20 (A) 12/31/2019 1149   PROTEINUR 30 (A) 12/31/2019 1149   UROBILINOGEN 0.2 11/20/2014 2159   NITRITE NEGATIVE 12/31/2019 1149   LEUKOCYTESUR NEGATIVE 12/31/2019 1149   Sepsis Labs Invalid input(s): PROCALCITONIN,  WBC,  LACTICIDVEN Microbiology Recent Results (from the past 240 hour(s))  Respiratory Panel by RT PCR (Flu A&B, Covid) - Nasopharyngeal Swab     Status: None   Collection Time: 12/31/19  4:40 PM   Specimen: Nasopharyngeal Swab  Result Value Ref Range Status   SARS Coronavirus 2 by RT PCR NEGATIVE NEGATIVE Final    Comment: (NOTE) SARS-CoV-2 target nucleic acids are NOT DETECTED.  The SARS-CoV-2 RNA is generally detectable in upper respiratoy specimens during the acute phase of infection. The lowest concentration of SARS-CoV-2 viral copies this assay can detect is 131 copies/mL. A negative result does not preclude SARS-Cov-2 infection and should not be used as the sole basis for treatment or other patient management decisions. A negative result may occur with  improper specimen collection/handling, submission of specimen other than nasopharyngeal swab, presence of viral mutation(s) within the areas targeted by this assay, and inadequate number of viral copies (<131 copies/mL). A negative result must be combined with  clinical observations, patient history, and epidemiological information. The expected result is Negative.  Fact Sheet for Patients:  https://www.moore.com/  Fact Sheet for Healthcare Providers:  https://www.young.biz/  This test is no t yet approved or cleared by the [place] FDA and  has been authorized for detection and/or diagnosis of SARS-CoV-2 by FDA under an Emergency Use Authorization (EUA). This EUA will remain  in effect (meaning this test can be used) for the duration of the COVID-19 declaration under Section 564(b)(1) of the Act, 21 U.S.C. section 360bbb-3(b)(1), unless the authorization is terminated or revoked sooner.     Influenza A by PCR NEGATIVE NEGATIVE Final   Influenza B by PCR NEGATIVE NEGATIVE Final    Comment: (NOTE) The Xpert Xpress SARS-CoV-2/FLU/RSV assay is intended as an aid in  the diagnosis of influenza from Nasopharyngeal swab specimens and  should not be used as a sole basis for treatment. Nasal washings and  aspirates are unacceptable for Xpert Xpress SARS-CoV-2/FLU/RSV  testing.  Fact Sheet for Patients: https://www.moore.com/  Fact Sheet for Healthcare Providers: https://www.young.biz/  This test is not yet approved or cleared by the [place] FDA and  has been authorized for detection and/or diagnosis of SARS-CoV-2 by  FDA under an Emergency Use Authorization (EUA). This EUA will remain  in effect (meaning this test can be used) for the duration of the  Covid-19 declaration under Section 564(b)(1) of the Act, 21  U.S.C. section 360bbb-3(b)(1), unless the authorization is  terminated or revoked. Performed at [facility], [address], [place], [place] [address]      Time coordinating discharge: 25  minutes  SIGNED: Lanae Boast, MD  [facility] 01/05/2020, 8:58 AM  If 7PM-7AM, please contact  night-coverage [email or web address]

## 2020-01-05 NOTE — Plan of Care (Signed)
Pt ready for DC home 

## 2020-01-05 NOTE — Progress Notes (Signed)
[facility] Progress Note     Subjective: Tolerating soft diet and having bowel function. Wants to go home.   Objective: Vital signs in last 24 hours: Temp:  [98.2 F (36.8 C)-98.6 F (37 C)] 98.2 F (36.8 C) (10/07 0509) Pulse Rate:  [59-61] 60 (10/07 0509) Resp:  [17-18] 18 (10/07 0509) BP: (115-140)/(53-59) 118/59 (10/07 0509) SpO2:  [97 %-100 %] 98 % (10/07 0509) Last BM Date: 01/04/20  Intake/Output from previous day: 10/06 0701 - 10/07 0700 In: 1555 [P.O.:1230; I.V.:325] Out: -  Intake/Output this shift: No intake/output data recorded.  PE: General: pleasant, WD, elderly male who is sitting up in NAD Lungs: Respiratory effort nonlabored Abd: soft, NT, mildly distended, +BS MS: all 4 extremities are symmetrical with no cyanosis, clubbing, or edema. Skin: warm and dry with no masses, lesions, or rashes Psych: A&Ox3 with an appropriate affect.  Lab Results:  Recent Labs    01/03/20 0255 01/04/20 0327  WBC 4.8 5.3  HGB 11.7* 12.1*  HCT 35.0* 36.7*  PLT 122* 120*   BMET Recent Labs    01/03/20 0255 01/04/20 0327  NA 140 141  K 3.5 3.7  CL 105 107  CO2 24 26  GLUCOSE 69* 97  BUN 22 9  CREATININE 0.70 0.62  CALCIUM 8.4* 8.3*   PT/INR No results for input(s): LABPROT, INR in the last 72 hours. CMP     Component Value Date/Time   NA 141 01/04/2020 0327   K 3.7 01/04/2020 0327   CL 107 01/04/2020 0327   CO2 26 01/04/2020 0327   GLUCOSE 97 01/04/2020 0327   BUN 9 01/04/2020 0327   CREATININE 0.62 01/04/2020 0327   CALCIUM 8.3 (L) 01/04/2020 0327   PROT 5.4 (L) 01/01/2020 0253   ALBUMIN 2.9 (L) 01/04/2020 0327   AST 19 01/01/2020 0253   ALT 16 01/01/2020 0253   ALKPHOS 57 01/01/2020 0253   BILITOT 0.8 01/01/2020 0253   GFRNONAA >60 01/04/2020 0327   GFRAA >60 01/03/2020 0255   Lipase     Component Value Date/Time   LIPASE 26 12/31/2019 1149       Studies/Results: No results found.  Anti-infectives: Anti-infectives  (From admission, onward)   None       Assessment/Plan SBO  - surgical history: open Nissen fundoplication 80s, cholecystectomy, appendectomy, and most recently hernia repair approximately 5 years ago. - SB protocol 10/3  - tolerating soft diet, cleared for discharge from a surgical perspective  FEN: Soft diet, SLIV VTE: lovenox ID: no current abx  LOS: 5 days    Juliet Rude , [facility] 01/05/2020, 8:18 AM Please see Amion for pager number during day hours 7:00am-4:30pm

## 2020-01-30 ENCOUNTER — Telehealth: Payer: Self-pay

## 2020-01-30 NOTE — Telephone Encounter (Signed)
Patient calls nurse line requesting to speak with Dr. Gerilyn Pilgrim. Patient reports that he has concerns for his current nutrition and would like to discuss with provider.   Will route to Dr. Gerilyn Pilgrim.   Veronda Prude, RN

## 2020-02-07 ENCOUNTER — Ambulatory Visit (INDEPENDENT_AMBULATORY_CARE_PROVIDER_SITE_OTHER): Payer: Medicare Other | Admitting: Internal Medicine

## 2020-02-07 ENCOUNTER — Encounter: Payer: Self-pay | Admitting: Internal Medicine

## 2020-02-07 ENCOUNTER — Other Ambulatory Visit: Payer: Self-pay

## 2020-02-07 VITALS — BP 102/50 | HR 57 | Ht 67.0 in | Wt 116.0 lb

## 2020-02-07 DIAGNOSIS — R634 Abnormal weight loss: Secondary | ICD-10-CM

## 2020-02-07 DIAGNOSIS — R600 Localized edema: Secondary | ICD-10-CM

## 2020-02-07 DIAGNOSIS — I739 Peripheral vascular disease, unspecified: Secondary | ICD-10-CM | POA: Diagnosis not present

## 2020-02-07 DIAGNOSIS — I471 Supraventricular tachycardia, unspecified: Secondary | ICD-10-CM

## 2020-02-07 NOTE — Patient Instructions (Signed)
Medication Instructions:  Your physician recommends that you continue on your current medications as directed. Please refer to the Current Medication list given to you today.  *If you need a refill on your cardiac medications before your next appointment, please call your pharmacy*  Follow-Up: At [facility], you and your health needs are our priority.  As part of our continuing mission to provide you with exceptional heart care, we have created designated Provider Care Teams.  These Care Teams include your primary Cardiologist (physician) and Advanced Practice Providers (APPs -  Physician Assistants and Nurse Practitioners) who all work together to provide you with the care you need, when you need it.  We recommend signing up for the patient portal called "MyChart".  Sign up information is provided on this After Visit Summary.  MyChart is used to connect with patients for Virtual Visits (Telemedicine).  Patients are able to view lab/test results, encounter notes, upcoming appointments, etc.  Non-urgent messages can be sent to your provider as well.   To learn more about what you can do with MyChart, go to [email or web address].    Your next appointment:   12 month(s)  The format for your next appointment:   In Person  Provider:   You may see Chrystie Nose, MD or one of the following Advanced Practice Providers on your designated Care Team:    Azalee Course, PA-C  Micah Flesher, PA-C or   Judy Pimple, [place]    Other Instructions Dr. Rennis Golden recommends KNEE HIGH COMPRESSION STOCKINGS  -- 20-30 mmHg (compression strength) -- [employer]  -- [address] [place]  -- [phone]  -- [employer]   -- [address] [place]   -- [phone] -- [facility]  -- [address] [place]  -- [phone]   How to Use Compression Stockings Compression stockings are elastic socks that squeeze the legs. They help to  increase blood flow to the legs, decrease swelling in the legs, and reduce the chance of developing blood clots in the lower legs. Compression stockings are often used by people who:  Are recovering from surgery.  Have poor circulation in their legs.  Are prone to getting blood clots in their legs.  Have varicose veins.  Sit or stay in bed for long periods of time. How to use compression stockings Before you put on your compression stockings:  Make sure that they are the correct size. If you do not know your size, ask your health care provider.  Make sure that they are clean, dry, and in good condition.  Check them for rips and tears. Do not put them on if they are ripped or torn. Put your stockings on first thing in the morning, before you get out of bed. Keep them on for as long as your health care provider advises. When you are wearing your stockings:  Keep them as smooth as possible. Do not allow them to bunch up. It is especially important to prevent the stockings from bunching up around your toes or behind your knees.  Do not roll the stockings downward and leave them rolled down. This can decrease blood flow to your leg.  Change them right away if they become wet or dirty. When you take off your stockings, inspect your legs and feet. Anything that does not seem normal may require medical attention. Look for:  Open sores.  Red spots.  Swelling. Information and tips  Do not stop wearing your compression stockings  without talking to your health care provider first.  Wash your stockings every day with mild detergent in cold or warm water. Do not use bleach. Air-dry your stockings or dry them in a clothes dryer on low heat.  Replace your stockings every 3-6 months.  If skin moisturizing is part of your treatment plan, apply lotion or cream at night so that your skin will be dry when you put on the stockings in the morning. It is harder to put the stockings on when you have  lotion on your legs or feet. Contact a health care provider if: Remove your stockings and seek medical care if:  You have a feeling of pins and needles in your feet or legs.  You have any new changes in your skin.  You have skin lesions that are getting worse.  You have swelling or pain that is getting worse. Get help right away if:  You have numbness or tingling in your lower legs that does not get better right after you take the stockings off.  Your toes or feet become cold and blue.  You develop open sores or red spots on your legs that do not go away.  You see or feel a warm spot on your leg.  You have new swelling or soreness in your leg.  You are short of breath or you have chest pain for no reason.  You have a rapid or irregular heartbeat.  You feel light-headed or dizzy. This information is not intended to replace advice given to you by your health care provider. Make sure you discuss any questions you have with your health care provider. Document Released: 01/12/2009 Document Revised: 08/15/2015 Document Reviewed: 02/22/2014 Elsevier Interactive Patient Education  2017 [employer].

## 2020-02-07 NOTE — Progress Notes (Signed)
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 Karsten FellsEichorn [facility] until 2010. In the past he said complaints of chest pain and shortness of breath but underwent workup including stress test and echocardiogram which was negative. He also had some lower extremity leg pain concerning for claudication but had normal arterial Dopplers. Recently he's had some left chest pain and shortness of breath. He also feels some discomfort from time to time and almost on a daily basis some fluttering or what described as palpitations. He saw his primary care provider for this who referred him back to our office. He does not have a lot of traditional cardiac risk factors. He denies hypertension, diabetes or significant family history of coronary disease. He reports he generally been thin most of his life and has a low blood pressure and low heart rate at rest. He was referred for a lexiscan stress test and monitor. The stress test was negative for ischemia with an EF of 54%. He wore the 48 hour monitor which demonstrated an episode of PSVT. This was less than 10 seconds however could've made him symptomatic. This could explain why been feeling some palpitations. It did not appear to be atrial fibrillation or atrial flutter.  09/12/2015  [name] returns today for follow-up. He's been taking his low-dose of Toprol but reports some occasional dizziness and fatigue with exertion. He has run a low normal blood pressure and I was concerned about it possibly getting lower with the beta blocker. He does note that his ectopy has improved on the beta blocker, but the trade off side effects do not seem to be worth it.  08/25/2017  [name] is seen today in follow-up.  Overall he is doing well.  He came off of the Toprol  because of low blood pressure and he did not quite feel as well on it.  He reports some palpitations intermittently but generally is not bothered by them.  He recently had an episode of amaurosis fugax.  He had a work-up through [facility] Marj[facility] and was thought to ultimately have migraine with aura.  He also had [facility]ologic evaluation at [facility].  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  02/07/2020  [name] is seen today for routine follow-up.  He was seen by Azalee CourseHao Meng, PA-C over the summer and was having issues with leg pain.  He underwent arterial Dopplers which were negative for obstruction.  More recently he was [facility] for [facility] obstruction.  He said prior abdominal surgeries and it was felt that adhesions may be playing a role in it.  He is also had significant weight loss, today at 116 pounds with a BMI of 18.  He says that at home at times he can weigh as low as 105 pounds.  He does report trying to eat and gain weight but is losing weight unintentionally.  He does not have an upcoming follow-up with his PCP and is said it has been difficult to get in there.  Blood pressure today was low at 102/50.  He denies chest pain or shortness of breath.  He has had some leg swelling and is noted to have varicose veins bilaterally.  He also mentions some issues with possible memory loss and had questions about blood flow to  his brain.  PMHx:  Past Medical History:  Diagnosis Date   Anxiety    Arthritis    Bladder calculus    BPH (benign prostatic hyperplasia)    Chronic constipation    Complication of anesthesia    " I had some coughing afterwards for a couple of days"--  per pt "perfers spinal anesthesia since general anesthesia congnitive issues when older"   Diverticulosis of colon    Dry eye syndrome of both eyes    Environmental allergies    GERD (gastroesophageal reflux disease)    occasional   History of adenomatous polyp of colon    08/  2004   History of kidney stones    History of squamous cell carcinoma in situ (SCCIS) of skin    s/p  excision 2013 facial areas and 06/ 2016 nose   Migraine    eye migraine occasional   Seasonal and perennial allergic rhinitis    Thrombocytopenia (HCC)    Tingling    hands and feet bilat , intermittantly-- per pt has lumbar bulging disk   Urinary frequency    Vocal fold atrophy    dysphonia-- per pt has to drink large amount of water to take even on pill   Wears glasses     Past Surgical History:  Procedure Laterality Date   APPENDECTOMY  child   CARDIOVASCULAR STRESS TEST  05-17-2015  dr Lilyana Lippman   Low risk nuclear study w/ no ischemia/  normal LV function and wall motion , stress ef 54% (lvef 45-54%)   CHOLECYSTECTOMY N/A 09/29/2014   Procedure: LAPAROSCOPIC CHOLECYSTECTOMY WITH INTRAOPERATIVE CHOLANGIOGRAM;  Surgeon: Ovidio Kin, MD;  Location: [facility];  Service: General;  Laterality: N/A;   COLONOSCOPY  last one 09-06-2010   ESOPHAGOGASTRODUODENOSCOPY N/A 12/09/2013   Procedure: ESOPHAGOGASTRODUODENOSCOPY (EGD);  Surgeon: Willis Modena, MD;  Location: [facility];  Service: Endoscopy;  Laterality: N/A;   EXTRACORPOREAL SHOCK WAVE LITHOTRIPSY  yrs ago   INGUINAL HERNIA REPAIR Left child   inguinal hernia repair  09/2017   NISSEN FUNDOPLICATION  1980's   open   STONE EXTRACTION WITH BASKET N/A 08/18/2016   Procedure: STONE EXTRACTION WITH BASKET;  Surgeon: Jethro Bolus, MD;  Location: [facility];  Service: Urology;  Laterality: N/A;   THULIUM LASER TURP (TRANSURETHRAL RESECTION OF PROSTATE) N/A 08/18/2016   Procedure: THULIUM LASER BLADDER NECK INCISION AND BLADDER STONE REMOVAL;  Surgeon: Jethro Bolus, MD;  Location: [facility];  Service: Urology;  Laterality: N/A;   TONSILLECTOMY  child   TRANSTHORACIC ECHOCARDIOGRAM  11/18/2010   grade 1 diastolic dysfunction, ef 55-60%/  trivial MR and TR/ mild dilated RA     FAMHx:  Family History  Problem Relation Age of Onset   Parkinsonism Brother    COPD Mother    Allergies Mother    Heart failure Mother    COPD Father    Stroke Father    Other Sister    Suicidality Maternal Aunt    Cancer Maternal Grandfather     SOCHx:   reports that he has never smoked. He has never used smokeless tobacco. He reports current alcohol use. He reports that he does not use drugs.  ALLERGIES:  Allergies  Allergen Reactions   Ciprofloxacin     JOINT PAIN   Flagyl [Metronidazole] Other (See Comments)    REACTION: no appetite, diarrhea after meal, decrease in weight   Metoclopramide Hcl Other (See Comments)    REACTION: "involuntary movements"   Other Other (  See Comments)    Antibiotics have unknown reaction propophol causes memory problems   Septra [Sulfamethoxazole-Trimethoprim] Other (See Comments)    REACTION: "involuntary movements" tripac antibiotic- heart rythm   Silodosin     ? Possibly allergy, could not breathe well out of nose   Soy Allergy Other (See Comments)    Stomach upset    ROS: Pertinent items noted in HPI and remainder of comprehensive ROS otherwise negative.  HOME MEDS: Current Outpatient Medications  Medication Sig Dispense Refill   ALPRAZolam (XANAX) 0.25 MG tablet Take 0.125 mg by mouth 2 (two) times daily as needed for anxiety or sleep.      Cholecalciferol (VITAMIN D-3) 1000 units CAPS Take 2,000 Units by mouth daily.      Cranberry 400 MG CAPS Take 400-1,200 mg by mouth every morning.      docusate sodium (COLACE) 50 MG capsule Take 1 capsule (50 mg total) by mouth at bedtime. 10 capsule 0   L-Tyrosine 500 MG CAPS Take 500 mg by mouth daily.      MAGNESIUM CITRATE PO Take 75 mg by mouth daily.     Melatonin 2.5 MG CAPS Take 1.25 mg by mouth at bedtime as needed (sleep).      Menaquinone-7 (VITAMIN K2 PO) Take 120-240 mg by mouth daily. Combo w/ Vit. D     Milk Thistle 175 MG CAPS Take 1 capsule  by mouth daily.      NON FORMULARY Take 1 capsule by mouth daily. Acetyl L carnitine     OVER THE COUNTER MEDICATION Take 3 capsules by mouth 2 (two) times daily. Lion's mane 0.5 gram/capsule     OVER THE COUNTER MEDICATION Take 2 capsules by mouth every morning. Reparagen supplement     Phosphatidylserine 100 MG CAPS Take 100 mg by mouth every morning.      Probiotic Product (PROBIOTIC DAILY PO) Take 1 capsule by mouth daily.      Propylene Glycol (SYSTANE BALANCE) 0.6 % SOLN Apply 1 drop to eye daily as needed (dry eyes).      sodium chloride (MURO 128) 5 % ophthalmic ointment Place 1 application into both eyes at bedtime.      THEANINE PO Take 2 tablets by mouth 2 (two) times daily.      Turmeric, Curcuma Longa, (CURCUMIN) POWD Take 220 mg by mouth daily as needed (suppliment).      vitamin C (ASCORBIC ACID) 250 MG tablet Take 250 mg by mouth daily.     No current facility-administered medications for this visit.    LABS/IMAGING: No results found for this or any previous visit (from the past 48 hour(s)). No results found.  WEIGHTS: Wt Readings from Last 3 Encounters:  02/07/20 116 lb (52.6 kg)  12/31/19 110 lb (49.9 kg)  10/11/19 119 lb 9.6 oz (54.3 kg)    VITALS: BP (!) 102/50    Pulse (!) 57    Ht 5\' 7"  (1.702 m)    Wt 116 lb (52.6 kg)    SpO2 97%    BMI 18.17 kg/m   EXAM: General appearance: alert and no distress Neck: no carotid bruit, no JVD and thyroid not enlarged, symmetric, no tenderness/mass/nodules Lungs: clear to auscultation bilaterally Heart: regular rate and rhythm Abdomen: soft, non-tender; bowel sounds normal; no masses,  no organomegaly Extremities: extremities normal, atraumatic, no cyanosis or edema Pulses: 2+ and symmetric Skin: Skin color, texture, turgor normal. No rashes or lesions Neurologic: Grossly normal Psych: Pleasant  EKG: Sinus bradycardia at  57-personally reviewed  ASSESSMENT: 1. Bilateral leg pain-suspect varicose veins,  normal lower extremity arterial Dopplers 2. History of PSVT 3. Unintentional weight loss 4. Recent small bowel obstruction 5. Memory loss  PLAN: 1.   Mr. Blau reports a number of issues that I have encouraged him to follow-up with his PCP about.  I am concerned about his recent unintentional weight loss and small bowel obstruction.  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  He had lower extremity Dopplers which were negative for arterial obstruction but does have evidence of varicose veins.  I recommended compression stockings which she could wear when he is up on his feet to help prevent swelling and discomfort.  He denies any recurrent SVT.  He is not on any medication such as a beta-blocker at this point due to bradycardia.  Follow-up with me annually or sooner as necessary.  Chrystie Nose, MD, [facility], FACP  [place]   [facility]  Medical Director of the [facility] Lipid [facility] &  Cardiovascular Risk Reduction Clinic Diplomate of the American Board of Clinical Lipidology Attending Cardiologist  Direct Dial: [phone]   Fax: [phone]  Website:  [email or web address] 02/07/2020, 4:22 PM

## 2020-02-09 NOTE — Progress Notes (Signed)
Telehealth Encounter  (Email link [email or web address] ) PCP Chilton Greathouse, MD Gastroenterologist Willis Modena, MD I connected with Lee Nelson (MRN [id number]) on 02/13/2020 by MyChart video-enabled, HIPAA-compliant telemedicine application, verified that I was speaking with the correct person using two identifiers, and that the patient was in a private environment conducive to confidentiality.  The patient agreed to proceed.  Provider was Linna Darner, PhD, RD, LDN, CEDRD Provider was located at [facility] during this telehealth encounter; patient was at home.  Appt start time: 1100 end time: 1115 (15 minutes)  Reason for telehealth visit: Referred by oncologist for Medical Nutrition Therapy related to severe malnutrition.  Relevant history/background: [name] has always been thin, and has a h/o severe reflux, chr constipation, and osteoporosis.  Although 116 lb at cardiology appt on 02/07/20, his weight after recent hospitalization for small bowel obstrxn was 106 lb (ht is 67.5").  He has been advised by his cardiologist, PCP, and GI to gain weight.    Assessment: Wife and son are both lactose-intolerant, so [name] was drinking coconut milk instead of dairy milk.   Usual eating pattern: 3 meals and 2-3 snacks per day. Frequent foods and beverages: water, 6-8 oz pomegranate or prune juice, 1-2 c coffee with ~3 tbsp 1% milk, 1 tsp maple syrup, ginger ale; chicken, eggs, bread, butter, honey, fruit, yogurt smoothie, white or sweet potatoes.   Avoided foods: high-fiber, nuts, seeds, fried foods, beans, corn, leafy greens, olive oil.   Usual physical activity: 30 min of n[place] track or stationary bike 1-3 X day; walks 15-20 min sporadically mostly inside.   Sleep: Estimates average of 4-7 hours of sleep/night.  Sleep difficulty b/c of active bladder and sometimes can't sleep after 4 or 5 AM.  Naps 30-60 min most afternoons.   24-hr recall:  (Up at 5 AM; took  colace + water) B (5:15 AM)-  1 peeled pear, 1 egg, 2 slc toast, 1 tsp butter, 8 oz yogurt smoothie (whole-milk yogurt, juice). Snk (9 AM)-  1 slc toast, 1 tsp butter, 1 tsp honey, raspberry sorbet  L (12 PM)-  [employer]s hot bar: 4 oz chx brst, 3 tbsp mayonnaise, 1 c broccoli, 3 small potatoes, 1/2 swt potato, 1 tsp ghee, water Snk ( PM)-  2 slc toast, olive oil D ( PM)-  1/2 can salmon, mayo, mushroom, >1 c broccoli&Br sprouts, olive oil, 2 slc bread Snk ( PM)-  ??? Typical day? Yes.   although had difficulty remembering yesterday's intake.    Intervention: Completed abbreviated diet and exercise history, and provided recommendations.   For recommendations and goals, see Patient Instructions.    Follow-up: prn  Matias Thurman,JEANNIE

## 2020-02-13 ENCOUNTER — Ambulatory Visit (INDEPENDENT_AMBULATORY_CARE_PROVIDER_SITE_OTHER): Payer: Medicare Other | Admitting: Family Medicine

## 2020-02-13 ENCOUNTER — Other Ambulatory Visit: Payer: Self-pay

## 2020-02-13 DIAGNOSIS — R634 Abnormal weight loss: Secondary | ICD-10-CM | POA: Diagnosis not present

## 2020-02-13 NOTE — Patient Instructions (Addendum)
When energy intake does not meet energy needs, protein needs are actually higher.  This means you want to get a meaningful amount of protein at each meal, and possibly some protein at snacks.  For example, meat portions should be at least 4 ounces.  (The size of a deck of cards is approximately 3-4 oz.)  For breakfast, have 2 eggs instead 1, and continue to get yogurt daily.  Each 1 egg, 1 ounce of cheese, and 2 tbsp peanut butter is equivalent to about 1 oz of meat, fish, or poultry.    Cereals: For now, feel free to use any cereal with no more than 2 grams of fiber per serving. Bread:  Most whole-wheat breads today are pretty refined, so you can use them interchangeably with white UNLESS there are cracked or whole seeds and nuts OR if the fiber content is especially high (>5 g per slice).    Vegetables you can add: Try roasting beets (peeled) and/or winter squash with onions and/or garlic.  Both beets and winter squash also make excellent pureed soups. If you buy some frozen leafy greens, it will allow you to use very small quantities initially, and these can also be used pureed in smoothies or soups.    Slowly add back other vegetables and fruits as well as other sources of fiber as tolerated.  The best approach is to add only one new food every 2-3 days.  It's safest to keep a written log of any new food and symptoms.  Write down what new food you try, how much, and what time you have it.  If you have symptoms in the next 24-48 hours, record that too.    You may want to write up a list of foods you'd like to try, so you can have a game plan for what you try when, and how you can prepare it.    I encourage you to pay attention to your hunger, and eat when you feel hungry, keeping easy snacks on hand.  Have a snack every time you do any exercise (before and/or after).  This may be bread with butter and honey or with cheese or smooth peanut butter; yogurt with fruit; 1/2 sandwich; apple sauce; or  (low-fiber) cereal with milk or yogurt.   Follow-up:  Call if any questions: [phone]; [email or web address].

## 2020-02-21 ENCOUNTER — Other Ambulatory Visit: Payer: Self-pay | Admitting: Oncology

## 2020-02-22 ENCOUNTER — Ambulatory Visit (HOSPITAL_COMMUNITY)
Admission: RE | Admit: 2020-02-22 | Discharge: 2020-02-22 | Disposition: A | Discharge status: Home / Self Care | Payer: Medicare Other | Source: Ambulatory Visit | Attending: Vascular Surgery | Admitting: Vascular Surgery

## 2020-02-22 ENCOUNTER — Other Ambulatory Visit: Payer: Self-pay

## 2020-02-22 ENCOUNTER — Other Ambulatory Visit (HOSPITAL_COMMUNITY): Payer: Self-pay | Admitting: Internal Medicine

## 2020-02-22 DIAGNOSIS — M7989 Other specified soft tissue disorders: Secondary | ICD-10-CM

## 2020-02-22 DIAGNOSIS — M79605 Pain in left leg: Secondary | ICD-10-CM

## 2020-04-27 ENCOUNTER — Ambulatory Visit: Payer: Medicare Other | Admitting: Surgical

## 2020-05-07 ENCOUNTER — Other Ambulatory Visit: Payer: Self-pay

## 2020-05-07 ENCOUNTER — Ambulatory Visit (INDEPENDENT_AMBULATORY_CARE_PROVIDER_SITE_OTHER): Payer: Medicare Other

## 2020-05-07 ENCOUNTER — Ambulatory Visit (INDEPENDENT_AMBULATORY_CARE_PROVIDER_SITE_OTHER): Payer: Medicare Other | Admitting: Surgical

## 2020-05-07 DIAGNOSIS — M25531 Pain in right wrist: Secondary | ICD-10-CM | POA: Diagnosis not present

## 2020-05-07 DIAGNOSIS — M79641 Pain in right hand: Secondary | ICD-10-CM

## 2020-05-10 ENCOUNTER — Telehealth: Payer: Self-pay | Admitting: Surgical

## 2020-05-10 NOTE — Telephone Encounter (Signed)
See below. Please advise. Thanks.  

## 2020-05-10 NOTE — Telephone Encounter (Signed)
Patient called requesting a call back from [facility]. Patient states he has an upcoming appt and need MRI to be for both hands/wrist. Please call patient about this matter at [phone].

## 2020-05-16 ENCOUNTER — Telehealth: Payer: Self-pay

## 2020-05-16 NOTE — Telephone Encounter (Signed)
Just one wrist talked about this in office today

## 2020-05-16 NOTE — Telephone Encounter (Signed)
Just right wrist MRI for now, will address left in future possible. Discussed this with Kelby in office today and he agrees

## 2020-05-16 NOTE — Telephone Encounter (Signed)
Please advise. Thanks.  

## 2020-05-16 NOTE — Telephone Encounter (Signed)
Pt came into the office stating that his left had is having some of the same issues as his right hand.  He was asking before we proceed with the MRI for his Right hand would it be possible to do his left hand at the same time ?

## 2020-05-17 ENCOUNTER — Encounter: Payer: Self-pay | Admitting: Surgical

## 2020-05-17 NOTE — Progress Notes (Signed)
Office Visit Note   Patient: Lee Nelson           Date of Birth: 1941/02/18           MRN: [id number] Visit Date: 05/07/2020 Requested by: Chilton Greathouse, MD [address] [place],  [place] [address] PCP: Chilton Greathouse, MD  Subjective: Chief Complaint  Patient presents with  . Other     Wrist/hand pain    HPI: Lee REH is a 80 y.o. male who presents to the office complaining of right hand and wrist pain.  Patient states that he fell off of his bed several months ago.  He initially was improving but now has plateaued.  He complains of ulnar-sided wrist pain as well as dorsal radial pain.  He is able to use his right hand but he notes painful range of motion.  He has been using ice and Tylenol for pain control.  He is right-hand dominant.  He has a history of trigger finger and has received a right middle finger trigger finger injection which provided relief in the past.  He denies any triggering symptoms currently..                ROS: All systems reviewed are negative as they relate to the chief complaint within the history of present illness.  Patient denies fevers or chills.  Assessment & Plan: Visit Diagnoses:  1. Pain in right wrist   2. Pain in right hand     Plan: Patient is a 80 year old male who presents complaint of right hand and wrist pain.  Localizes most of his pain to the right wrist.  Has had increased pain since falling off bed several months ago.  Initially had some improvement but has now plateaued.  He is using a hand brace with some relief.  Most of his pain seems to be ulnar-sided with most of the tenderness in the ulnar fovea on exam today.  Radiographs of the right hand are negative for any acute findings.  Plan to order MRI of the right wrist for further evaluation.  Patient agreed with plan.  Follow-up after MRI to review results.  Follow-Up Instructions: No follow-ups on file.   Orders:  Orders Placed This Encounter  Procedures  . XR  Hand Complete Right  . MR Wrist Right w/o contrast   No orders of the defined types were placed in this encounter.     Procedures: No procedures performed   Clinical Data: No additional findings.  Objective: Vital Signs: There were no vitals taken for this visit.  Physical Exam:  Constitutional: Patient appears well-developed HEENT:  Head: Normocephalic Eyes:EOM are normal Neck: Normal range of motion Cardiovascular: Normal rate Pulmonary/chest: Effort normal Neurologic: Patient is alert Skin: Skin is warm Psychiatric: Patient has normal mood and affect  Ortho Exam: Ortho exam demonstrates right wrist with equivalent extension, pronation, supination, flexion compared with the contralateral wrist.  No significant swelling noted.  Does have mild tenderness in the anatomic snuffbox and moderate tenderness in the ulnar fovea.  No tenderness in the 3-4 portal, 4-5 portal, scaphoid tubercle, first compartment.  No triggering noted on exam today.  No pain with thumb circumduction.  Negative Finkelstein's test.  Negative Tinel's, Phalen's.  Specialty Comments:  No specialty comments available.  Imaging: No results found.   PMFS History: Patient Active Problem List   Diagnosis Date Noted  . SBO (small bowel obstruction) (HCC) 12/31/2019  . Amaurosis fugax of right eye 08/25/2017  .  PSVT (paroxysmal supraventricular tachycardia) (HCC) 09/12/2015  . Chest pain 05/03/2015  . Dyspnea 05/03/2015  . ALLERGIC RHINITIS 09/21/2007  . G E R D 09/21/2007  . SMOKE INHALATION 09/21/2007  . OSTEOPOROSIS 01/28/2007  . PALPITATIONS 01/28/2007  . COUGH 01/28/2007  . ALLERGY 01/28/2007   Past Medical History:  Diagnosis Date  . Anxiety   . Arthritis   . Bladder calculus   . BPH (benign prostatic hyperplasia)   . Chronic constipation   . Complication of anesthesia    " I had some coughing afterwards for a couple of days"--  per pt "perfers spinal anesthesia since general anesthesia  congnitive issues when older"  . Diverticulosis of colon   . Dry eye syndrome of both eyes   . Environmental allergies   . GERD (gastroesophageal reflux disease)    occasional  . History of adenomatous polyp of colon    08/ 2004  . History of kidney stones   . History of squamous cell carcinoma in situ (SCCIS) of skin    s/p  excision 2013 facial areas and 06/ 2016 nose  . Migraine    eye migraine occasional  . Seasonal and perennial allergic rhinitis   . Thrombocytopenia (HCC)   . Tingling    hands and feet bilat , intermittantly-- per pt has lumbar bulging disk  . Urinary frequency   . Vocal fold atrophy    dysphonia-- per pt has to drink large amount of water to take even on pill  . Wears glasses     Family History  Problem Relation Age of Onset  . Parkinsonism Brother   . COPD Mother   . Allergies Mother   . Heart failure Mother   . COPD Father   . Stroke Father   . Other Sister   . Suicidality Maternal Aunt   . Cancer Maternal Grandfather     Past Surgical History:  Procedure Laterality Date  . APPENDECTOMY  child  . CARDIOVASCULAR STRESS TEST  05-17-2015  dr hilty   Low risk nuclear study w/ no ischemia/  normal LV function and wall motion , stress ef 54% (lvef 45-54%)  . CHOLECYSTECTOMY N/A 09/29/2014   Procedure: LAPAROSCOPIC CHOLECYSTECTOMY WITH INTRAOPERATIVE CHOLANGIOGRAM;  Surgeon: Ovidio Kin, MD;  Location: [facility];  Service: General;  Laterality: N/A;  . COLONOSCOPY  last one 09-06-2010  . ESOPHAGOGASTRODUODENOSCOPY N/A 12/09/2013   Procedure: ESOPHAGOGASTRODUODENOSCOPY (EGD);  Surgeon: Willis Modena, MD;  Location[facility];  Service: Endoscopy;  Laterality: N/A;  . EXTRACORPOREAL SHOCK WAVE LITHOTRIPSY  yrs ago  . INGUINAL HERNIA REPAIR Left child  . inguinal hernia repair  09/2017  . NISSEN FUNDOPLICATION  1980's   open  . STONE EXTRACTION WITH BASKET N/A 08/18/2016   Procedure: STONE EXTRACTION WITH BASKET;  Surgeon: Jethro Bolus, MD;   Location: [facility];  Service: Urology;  Laterality: N/A;  . THULIUM LASER TURP (TRANSURETHRAL RESECTION OF PROSTATE) N/A 08/18/2016   Procedure: THULIUM LASER BLADDER NECK INCISION AND BLADDER STONE REMOVAL;  Surgeon: Jethro Bolus, MD;  Location: [facility];  Service: Urology;  Laterality: N/A;  . TONSILLECTOMY  child  . TRANSTHORACIC ECHOCARDIOGRAM  11/18/2010   grade 1 diastolic dysfunction, ef 55-60%/  trivial MR and TR/ mild dilated RA   Social History   Occupational History  . Occupation: teaches constitutional Social worker  . 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 Smoking status: Never Smoker  . Smokeless tobacco: Never  Used  Substance and Sexual Activity  . Alcohol use: Yes    Comment: social  . Drug use: No  . Sexual activity: Not on file

## 2020-05-20 ENCOUNTER — Other Ambulatory Visit: Payer: Medicare Other

## 2020-06-04 ENCOUNTER — Ambulatory Visit (INDEPENDENT_AMBULATORY_CARE_PROVIDER_SITE_OTHER): Payer: Medicare Other | Admitting: Diagnostic Neuroimaging

## 2020-06-04 ENCOUNTER — Encounter: Payer: Self-pay | Admitting: Diagnostic Neuroimaging

## 2020-06-04 ENCOUNTER — Telehealth: Payer: Self-pay | Admitting: Diagnostic Neuroimaging

## 2020-06-04 VITALS — BP 97/63 | HR 63 | Ht 68.5 in | Wt 120.8 lb

## 2020-06-04 DIAGNOSIS — R269 Unspecified abnormalities of gait and mobility: Secondary | ICD-10-CM | POA: Diagnosis not present

## 2020-06-04 DIAGNOSIS — G3184 Mild cognitive impairment, so stated: Secondary | ICD-10-CM | POA: Diagnosis not present

## 2020-06-04 NOTE — Telephone Encounter (Signed)
FYI: Pt called to schedule an appt due to memory loss, difficulty walking. Have scheduled for today 11a.

## 2020-06-04 NOTE — Patient Instructions (Signed)
MEMORY LOSS / ANXIETY / DEPRESSION - stable; continue good nutrition, exercises, brain stimulating activities - improve nutrition, exercise, mood stabilization and insomnia - consider psychology for anxiety / insomnia

## 2020-06-04 NOTE — Progress Notes (Signed)
[name]  PATIENT: Lee Nelson DOB: [birth year]  REFERRING CLINICIAN: Avva HISTORY FROM: patient  REASON FOR VISIT: follow up   HISTORICAL  CHIEF COMPLAINT:  Chief Complaint  Patient presents with  . Memory Loss    Rm 6 FU requested MMSE 27 "never heard about psychology referral last year"  . Gait Problem    Has fallen once in past year    HISTORY OF PRESENT ILLNESS:   UPDATE (06/04/20, VRP): Since last visit, doing poorly. Continues with stress and mild memory issues and depression. Has feelings of regret towards prior financial decisions and health care for his wife.   UPDATE (05/03/19, VRP): Since last visit, doing slightly worse, more memory loss, stress, gait difficulty. No alleviating or aggravating factors. More stress related to wife's health, cargiving expenses, having to retire from Teacher, adult education.     UPDATE (06/01/18, VRP): Since last visit, noting more memory issues. Has missed a meeting. Some words are jumbled. Symptoms are progressive. Insomnia and anxiety have worsened.   UPDATE (11/09/17, VRP): Since last visit, doing about the same. Symptoms are stable. Severity is mild. No alleviating or aggravating factors. Tolerating supplements. PT exercises helped, but now recovering from hernia 10/16/17. Also had transient right eye vision changes in May 2019 (shade over the right eye) x 1 minute. No recurrence.  UPDATE (05/07/17, VRP): Since last visit, doing about the same. Tolerating meds. No alleviating or aggravating factors. Has had more falls and balance issues. More memory loss issues.   UPDATE 12/30/16: Since last visit, continues with word finding diff. Also with intermittent right sided headaches, intermittent double vision, and neck pain. Avg 1-2 days per week of headache.   UPDATE 03/10/14: Since last visit, no more double vision attacks. Notes some work finding diff. Still with stress from his wife's quadriplegia and 24 hour care at home.    PRIOR HPI (09/07/13): 80 year old right-handed male with history of neck pain, back pain, skin cancer, reflux disease, osteoporosis, anxiety, fibromyalgia, migraine, here for evaluation of headaches and transient double vision. Previous evaluated patient in 2012 for right arm numbness. Patient reports over ten-year history of intermittent neck pain. Patient has some degenerative cervical spine disease managed conservatively. Patient has one to 2 year history of intermittent headaches mainly in the right parietal and right occipital region. Some pain radiates from the neck up to the head. No nausea or vomiting. No photophobia or phonophobia. Sometimes he has sharp shooting brief pains. He rates pain severity 2-3/10. More recently headache symptoms have been more constant. No visual symptoms associated with headache. Separately patient has intermittent visual disturbance where he sees "swimming lines" in front of him, but these are not associated with headache. 10 days ago patient was at a meeting talking to some people, when he had sudden onset of vertical double vision lasting for one to 2 minutes. He had mild queasiness sensation. No vomiting. He had mild equilibrium problem without falling out of his chair. No slurred speech. No numbness or tingling. Symptoms resolved on their own. No headaches associated with this.   REVIEW OF SYSTEMS: Full 14 system review of systems performed and negative except: as per HPI.  ALLERGIES: Allergies  Allergen Reactions  . Ciprofloxacin     JOINT PAIN  . Flagyl [Metronidazole] Other (See Comments)    REACTION: no appetite, diarrhea after meal, decrease in weight  . Metoclopramide Hcl Other (See Comments)    REACTION: "involuntary movements"  . Other Other (See Comments)  Antibiotics have unknown reaction propophol causes memory problems  . Propofol Other (See Comments)  . Risperidone Other (See Comments)    "do not want"  . Septra  [Sulfamethoxazole-Trimethoprim] Other (See Comments)    REACTION: "involuntary movements" tripac antibiotic- heart rythm  . Silodosin     ? Possibly allergy, could not breathe well out of nose  . Soy Allergy Other (See Comments)    Stomach upset    HOME MEDICATIONS: Outpatient Medications Prior to Visit  Medication Sig Dispense Refill  . B Complex Vitamins (VITAMIN B COMPLEX) TABS 1 tab    . Cholecalciferol (VITAMIN D-3) 1000 units CAPS Take 2,000 Units by mouth daily.     . Cranberry 400 MG CAPS Take 400-1,200 mg by mouth every morning.     . docusate sodium (COLACE) 50 MG capsule Take 1 capsule (50 mg total) by mouth at bedtime. 10 capsule 0  . L-Tyrosine 500 MG CAPS Take 500 mg by mouth daily.     Marland Kitchen MAGNESIUM CITRATE PO Take 75 mg by mouth daily.    . Menaquinone-7 (VITAMIN K2 PO) Take 120-240 mg by mouth daily. Combo w/ Vit. D    . NON FORMULARY Take 1 capsule by mouth daily. Acetyl L carnitine    . OVER THE COUNTER MEDICATION Take 3 capsules by mouth 2 (two) times daily. Lion's mane 0.5 gram/capsule    . OVER THE COUNTER MEDICATION Take 2 capsules by mouth every morning. Reparagen supplement    . OVER THE COUNTER MEDICATION Melatonin patch    . Phosphatidylserine 100 MG CAPS Take 100 mg by mouth every morning.     . Probiotic Product (PROBIOTIC DAILY PO) Take 1 capsule by mouth daily.     Marland Kitchen Propylene Glycol (SYSTANE BALANCE) 0.6 % SOLN Apply 1 drop to eye daily as needed (dry eyes).     Marland Kitchen senna (SENOKOT) 8.6 MG tablet 2 tablets at bedtime as needed    . sodium chloride (MURO 128) 5 % ophthalmic ointment Place 1 application into both eyes at bedtime.     . THEANINE PO Take 2 tablets by mouth 2 (two) times daily.     . Turmeric, Curcuma Longa, (CURCUMIN) POWD Take 220 mg by mouth daily as needed (suppliment).     Ardelia Mems TO FIND Med Name: cocovia memory    . vitamin C (ASCORBIC ACID) 250 MG tablet Take 250 mg by mouth daily.    Marland Kitchen ALPRAZolam (XANAX) 0.25 MG tablet Take 0.125 mg by  mouth 2 (two) times daily as needed for anxiety or sleep.  (Patient not taking: Reported on 06/04/2020)    . Melatonin 2.5 MG CAPS Take 1.25 mg by mouth at bedtime as needed (sleep).     . Milk Thistle 175 MG CAPS Take 1 capsule by mouth daily.      No facility-administered medications prior to visit.    PAST MEDICAL HISTORY: Past Medical History:  Diagnosis Date  . Anxiety   . Arthritis   . Bladder calculus   . BPH (benign prostatic hyperplasia)   . Chronic constipation   . Complication of anesthesia    " I had some coughing afterwards for a couple of days"--  per pt "perfers spinal anesthesia since general anesthesia congnitive issues when older"  . Diverticulosis of colon   . Dry eye syndrome of both eyes   . Environmental allergies   . GERD (gastroesophageal reflux disease)    occasional  . History of adenomatous polyp of colon  08/ 2004  . History of kidney stones   . History of squamous cell carcinoma in situ (SCCIS) of skin    s/p  excision 2013 facial areas and 06/ 2016 nose  . Migraine    eye migraine occasional  . Seasonal and perennial allergic rhinitis   . Thrombocytopenia (HCC)   . Tingling    hands and feet bilat , intermittantly-- per pt has lumbar bulging disk  . Urinary frequency   . Vocal fold atrophy    dysphonia-- per pt has to drink large amount of water to take even on pill  . Wears glasses     PAST SURGICAL HISTORY: Past Surgical History:  Procedure Laterality Date  . APPENDECTOMY  child  . CARDIOVASCULAR STRESS TEST  05-17-2015  dr hilty   Low risk nuclear study w/ no ischemia/  normal LV function and wall motion , stress ef 54% (lvef 45-54%)  . CHOLECYSTECTOMY N/A 09/29/2014   Procedure: LAPAROSCOPIC CHOLECYSTECTOMY WITH INTRAOPERATIVE CHOLANGIOGRAM;  Surgeon: Ovidio Kinavid Newman, MD;  Location: [facility];  Service: General;  Laterality: N/A;  . COLONOSCOPY  last one 09-06-2010  . ESOPHAGOGASTRODUODENOSCOPY N/A 12/09/2013   Procedure:  ESOPHAGOGASTRODUODENOSCOPY (EGD);  Surgeon: Willis ModenaWilliam Outlaw, MD;  [facility];  Service: Endoscopy;  Laterality: N/A;  . EXTRACORPOREAL SHOCK WAVE LITHOTRIPSY  yrs ago  . INGUINAL HERNIA REPAIR Left child  . inguinal hernia repair  09/2017  . NISSEN FUNDOPLICATION  1980's   open  . STONE EXTRACTION WITH BASKET N/A 08/18/2016   Procedure: STONE EXTRACTION WITH BASKET;  Surgeon: Jethro Bolusannenbaum, Sigmund, MD;  [facility];  Service: Urology;  Laterality: N/A;  . THULIUM LASER TURP (TRANSURETHRAL RESECTION OF PROSTATE) N/A 08/18/2016   Procedure: THULIUM LASER BLADDER NECK INCISION AND BLADDER STONE REMOVAL;  Surgeon: Jethro Bolusannenbaum, Sigmund, MD;  [facility];  Service: Urology;  Laterality: N/A;  . TONSILLECTOMY  child  . TRANSTHORACIC ECHOCARDIOGRAM  11/18/2010   grade 1 diastolic dysfunction, ef 55-60%/  trivial MR and TR/ mild dilated RA    FAMILY HISTORY: Family History  Problem Relation Age of Onset  . Parkinsonism Brother   . COPD Mother   . Allergies Mother   . Heart failure Mother   . COPD Father   . Stroke Father   . Other Sister   . Suicidality Maternal Aunt   . Cancer Maternal Grandfather     SOCIAL HISTORY:  Social History   Socioeconomic History  . Marital status: Married    Spouse name: Gavin PoundDeborah  . Number of children: 1  . [place]  . Highest education level: Not on file  Occupational History  . Occupation: teaches constitutional Social workerlaw  . Occupation: PhotographerOR    Employer: [employer]  Tobacco Use  . Smoking status: Never Smoker  . Smokeless tobacco: Never Used  Substance and Sexual Activity  . Alcohol use: Yes    Comment: social  . Drug use: No  . Sexual activity: Not on file  Other Topics Concern  . Not on file  Social History Narrative   Patient lives at home wife.   Daily caffeine use   Social Determinants of Health   Financial Resource Strain: Not on file  Food  Insecurity: Not on file  Transportation Needs: Not on file  Physical Activity: Not on file  Stress: Not on file  Social Connections: Not on file  Intimate Partner Violence: Not on file     PHYSICAL EXAM  GENERAL EXAM/CONSTITUTIONAL: Vitals:  Vitals:   06/04/20 1059  BP: 97/63  Pulse: 63  Weight: 120 lb 12.8 oz (54.8 kg)  Height: 5' 8.5" (1.74 m)   Body mass index is 18.1 kg/m. Wt Readings from Last 3 Encounters:  06/04/20 120 lb 12.8 oz (54.8 kg)  02/07/20 116 lb (52.6 kg)  12/31/19 110 lb (49.9 kg)    Patient is in no distress; well developed, nourished and groomed; neck is supple  CARDIOVASCULAR:  Examination of carotid arteries is normal; no carotid bruits  Regular rate and rhythm, no murmurs  Examination of peripheral vascular system by observation and palpation is normal  EYES:  Ophthalmoscopic exam of optic discs and posterior segments is normal; no papilledema or hemorrhages No exam data present  MUSCULOSKELETAL:  Gait, strength, tone, movements noted in Neurologic exam below  NEUROLOGIC: MENTAL STATUS:  MMSE - Mini Mental State Exam 06/04/2020 05/03/2019 06/01/2018  Orientation to time 5 5 5   Orientation to Place 5 5 5   Registration 3 3 3   Attention/ Calculation 2 5 5   Recall 3 3 3   Language- name 2 objects 2 2 2   Language- repeat 1 1 1   Language- follow 3 step command 3 3 3   Language- read & follow direction 1 1 1   Write a sentence 1 1 1   Copy design 1 1 0  Total score 27 30 29     awake, alert, oriented to person, place and time  recent and remote memory intact  normal attention and concentration  language fluent, comprehension intact, naming intact  fund of knowledge appropriate  CRANIAL NERVE:   2nd - no papilledema on fundoscopic exam  2nd, 3rd, 4th, 6th - pupils equal and reactive to light, visual fields full to confrontation, extraocular muscles intact, no nystagmus  5th - facial sensation symmetric  7th - facial strength  symmetric  8th - hearing intact  9th - palate elevates symmetrically, uvula midline  11th - shoulder shrug symmetric  12th - tongue protrusion midline  MOTOR:   normal bulk and tone, full strength in the BUE, BLE  MILD BRADYKINESIA IN BUE / BLE  SENSORY:   normal and symmetric to light touch, temperature, vibration  COORDINATION:   finger-nose-finger, fine finger movements normal  REFLEXES:   deep tendon reflexes --> BUE 1-2; BLE 2-3; symmetric  GAIT/STATION:   narrow based gait; STOOPED POSTURE; SLIGHT STAGGERING GAIT       DIAGNOSTIC DATA (LABS, IMAGING, TESTING) - I reviewed patient records, labs, notes, testing and imaging myself where available.  Lab Results  Component Value Date   WBC 5.3 01/04/2020   HGB 12.1 (L) 01/04/2020   HCT 36.7 (L) 01/04/2020   MCV 100.3 (H) 01/04/2020   PLT 120 (L) 01/04/2020      Component Value Date/Time   NA 141 01/04/2020 0327   K 3.7 01/04/2020 0327   CL 107 01/04/2020 0327   CO2 26 01/04/2020 0327   GLUCOSE 97 01/04/2020 0327   BUN 9 01/04/2020 0327   CREATININE 0.62 01/04/2020 0327   CALCIUM 8.3 (L) 01/04/2020 0327   PROT 5.4 (L) 01/01/2020 0253   ALBUMIN 2.9 (L) 01/04/2020 0327   AST 19 01/01/2020 0253   ALT 16 01/01/2020 0253   ALKPHOS 57 01/01/2020 0253   BILITOT 0.8 01/01/2020 0253   GFRNONAA >60 01/04/2020 0327   GFRAA >60 01/03/2020 0255   No results found for: CHOL No results found for: HGBA1C Lab Results  Component Value Date   VITAMINB12 711  05/08/2017   No results found for: TSH  07/20/10 MRI CERVICAL 1. Left C6-C7 paracentral disc protrusion abutting the exiting left C7 nerve roots appears new. Preexisting mild left C7 foraminal stenosis at this level due to facet hypertrophy.  2. Mild combined congenital and acquired spinal stenosis at the C3-C4 and C4-C5.  11/20/10 MRI brain (without contrast) demonstrating: 1. Moderate ventriculomegaly in the temporal and occipital horns.  Likely due to  subcortical atrophy. 2. No acute findings are seen.  11/20/10 MRA head (without contrast) demonstrating: - mild atheromatous irregularities in the bilateral carotid siphon regions.  11/19/10 EMG/NCS - normal  11/19/10 carotid u/s - normal  11/18/10 TTE - EF 55-60%; normal wall motion  09/19/13 MRI brain (with and without) demonstrating: 1. Moderate mesial temporal atrophy. 2. Moderate ventriculomegaly on ex vacuo basis. 3. No acute findings.  11/11/16 MRI brain [I reviewed images myself and agree with interpretation. -VRP]  1. No acute intracranial process. 2. Progressed nonspecific moderate to severe parenchymal brain volume loss for age. No hydrocephalus.  02/02/17 MRI cervical spine [I reviewed images myself and agree with interpretation. -VRP]  1. At C3-C4, there is borderline spinal stenosis due to ligamentum flavum hypertrophy and congenitally short pedicles.   There is no nerve root compression. 2. At C4-C5, there is mild spinal stenosis due to disc bulging, ligament of flavum hypertrophy, facet hypertrophy, mild uncovertebral spurring and congenitally short pedicles. There is no nerve root compression. Degenerative changes at this level have slightly progressed compared to the 07/20/2010 MRI. 3. At C5-C6, there are degenerative changes no nerve root compression or spinal stenosis 4. At C6-C7, there are degenerative changes causing mild foraminal narrowing but no nerve root compression.   On the previous MRI there was a disc protrusion to the left that is not appreciated on the current study. 5. The spinal cord has normal signal.   08/11/17 CAROTID U/S Right Carotid: There was no evidence of thrombus, dissection, atherosclerotic plaque or stenosis in the cervical carotid system.  Left Carotid: Velocities in the left ICA are consistent with a 1-39% stenosis.  Vertebrals: Bilateral vertebral arteries demonstrate antegrade flow.  Subclavians: Normal flow hemodynamics were seen in  bilateral subclavian arteries.    ASSESSMENT AND PLAN  80 y.o. year old male here with chronic neck pain x 10 years, int HA x 1-2 years, with a 2 minute episode of vertical double vision in June 2015, with no recurrence.   Also with gait diff and memory loss and weight loss and gait difficulty.  Dx:  MCI (mild cognitive impairment)  Gait difficulty     PLAN:  MEMORY LOSS / ANXIETY / DEPRESSION - stable; continue good nutrition, exercises, brain stimulating activities - improve nutrition, exercise, mood stabilization and insomnia - consider psychology for anxiety / insomnia  GAIT DIFFICULTY / HYPERREFLEXIA / SPINAL STENOSIS - stable; continue exercises; use cane / walker if needed - consider PT evaluation  TIA (right eye amaurosis fugax; May 2019) - advised to take aspirin  daily, but caution with history of low platelets and current curcumin use  MIGRAINE WITH AURA - improved; monitor for now (did not tolerated amitriptyline)  Return for return to PCP, pending if symptoms worsen or fail to improve.    Suanne Marker, MD 06/04/2020, 11:34 AM Certified in Neurology, Neurophysiology and Neuroimaging  [facility] [address] [place], [place] [address] [phone]

## 2020-06-28 ENCOUNTER — Ambulatory Visit
Admission: RE | Admit: 2020-06-28 | Discharge: 2020-06-28 | Disposition: A | Discharge status: Home / Self Care | Payer: Medicare Other | Source: Ambulatory Visit | Attending: Surgical | Admitting: Surgical

## 2020-06-28 ENCOUNTER — Other Ambulatory Visit: Payer: Self-pay

## 2020-06-28 DIAGNOSIS — M25531 Pain in right wrist: Secondary | ICD-10-CM

## 2020-07-04 ENCOUNTER — Other Ambulatory Visit: Payer: Self-pay

## 2020-07-04 ENCOUNTER — Ambulatory Visit: Payer: Medicare Other

## 2020-07-04 ENCOUNTER — Ambulatory Visit (INDEPENDENT_AMBULATORY_CARE_PROVIDER_SITE_OTHER): Payer: Medicare Other | Admitting: Orthopedic Surgery

## 2020-07-04 DIAGNOSIS — M79605 Pain in left leg: Secondary | ICD-10-CM | POA: Diagnosis not present

## 2020-07-06 ENCOUNTER — Encounter: Payer: Self-pay | Admitting: Orthopedic Surgery

## 2020-07-06 MED ORDER — ACETAMINOPHEN-CODEINE #3 300-30 MG PO TABS: ORAL_TABLET | ORAL | 0 refills | Status: DC

## 2020-07-06 NOTE — Progress Notes (Signed)
Office Visit Note   Patient: Lee Nelson           Date of Birth: 01/03/1941           MRN: [id number] Visit Date: 07/04/2020 Requested by: Chilton Greathouse, MD [address] [place],  [place] [address] PCP: Chilton Greathouse, MD  Subjective: Chief Complaint  Patient presents with  . Left Leg - Pain  . Left Hip - Pain    HPI: Lee Nelson is a 80 year old patient with left leg pain.  Had leg pain for 2 days.  Had a fall out of bed.  Reports groin pain since that time as well.  Hurts for him to turn the foot or lift the leg.  The pain does wake him from sleep at night.  Denies any numbness and tingling.  He also reports some low back pain.  He has been using 2 canes and states that he feels more feeble than prior to his fall.  He does describe in general stocking glove neuropathy which is chronic.  He is here with his sister in law today.              ROS: All systems reviewed are negative as they relate to the chief complaint within the history of present illness.  Patient denies  fevers or chills.   Assessment & Plan: Visit Diagnoses:  1. Pain in left leg     Plan: Impression is hip back and pelvic pain following fall out of bed for 2 days.  MRI scan of the right hand is also reviewed with the patient which shows some tendinitis but his bigger issue now is diminished walking ability pelvic back and groin pain after a relatively minor fall out of bed.  Radiographs difficult to interpret due to [place] to porosis.  I think it is possible that he may have stress reaction/stress fracture in the left hip versus sacral insufficiency fracture versus compression fracture in the lumbar spine.  He has been taking Tylenol 3 from prior surgery.  That is prescribed today.  Needs MRI lumbar spine to evaluate for compression fractures and MRI pelvis to evaluate for insufficiency fracture of the sacrum and or left hip.  Follow-Up Instructions: Return for after MRI.   Orders:  Orders Placed This Encounter   Procedures  . XR HIP UNILAT W OR W/O PELVIS 2-3 VIEWS LEFT  . XR Lumbar Spine 2-3 Views  . MR Lumbar Spine w/o contrast  . MR Pelvis w/o contrast   No orders of the defined types were placed in this encounter.     Procedures: No procedures performed   Clinical Data: No additional findings.  Objective: Vital Signs: There were no vitals taken for this visit.  Physical Exam:   Constitutional: Patient appears well-developed HEENT:  Head: Normocephalic Eyes:EOM are normal Neck: Normal range of motion Cardiovascular: Normal rate Pulmonary/chest: Effort normal Neurologic: Patient is alert Skin: Skin is warm Psychiatric: Patient has normal mood and affect    Ortho Exam: Ortho exam demonstrates good ankle dorsiflexion plantarflexion strength.  Pedal pulses palpable he  does demonstrate difficulty with prolonged standing as well as getting up from the chair.  Has mild groin pain on the left with internal ex rotation of the leg none on the right.  Hip flexion strength is 5- out of 5 on the left 5+ out of 5 on the right.  Does have pain with forward lateral bending.  No definite nerve root tension signs bilaterally.  No definite paresthesias  in dermatomal distribution but he does have some describes numbness in hands and feet.  Specialty Comments:  No specialty comments available.  Imaging: No results found.   PMFS History: Patient Active Problem List   Diagnosis Date Noted  . SBO (small bowel obstruction) (HCC) 12/31/2019  . Amaurosis fugax of right eye 08/25/2017  . PSVT (paroxysmal supraventricular tachycardia) (HCC) 09/12/2015  . Chest pain 05/03/2015  . Dyspnea 05/03/2015  . ALLERGIC RHINITIS 09/21/2007  . G E R D 09/21/2007  . SMOKE INHALATION 09/21/2007  . OSTEOPOROSIS 01/28/2007  . PALPITATIONS 01/28/2007  . COUGH 01/28/2007  . ALLERGY 01/28/2007   Past Medical History:  Diagnosis Date  . Anxiety   . Arthritis   . Bladder calculus   . BPH (benign  prostatic hyperplasia)   . Chronic constipation   . Complication of anesthesia    " I had some coughing afterwards for a couple of days"--  per pt "perfers spinal anesthesia since general anesthesia congnitive issues when older"  . Diverticulosis of colon   . Dry eye syndrome of both eyes   . Environmental allergies   . GERD (gastroesophageal reflux disease)    occasional  . History of adenomatous polyp of colon    08/ 2004  . History of kidney stones   . History of squamous cell carcinoma in situ (SCCIS) of skin    s/p  excision 2013 facial areas and 06/ 2016 nose  . Migraine    eye migraine occasional  . Seasonal and perennial allergic rhinitis   . Thrombocytopenia (HCC)   . Tingling    hands and feet bilat , intermittantly-- per pt has lumbar bulging disk  . Urinary frequency   . Vocal fold atrophy    dysphonia-- per pt has to drink large amount of water to take even on pill  . Wears glasses     Family History  Problem Relation Age of Onset  . Parkinsonism Brother   . COPD Mother   . Allergies Mother   . Heart failure Mother   . COPD Father   . Stroke Father   . Other Sister   . Suicidality Maternal Aunt   . Cancer Maternal Grandfather     Past Surgical History:  Procedure Laterality Date  . APPENDECTOMY  child  . CARDIOVASCULAR STRESS TEST  05-17-2015  dr hilty   Low risk nuclear study w/ no ischemia/  normal LV function and wall motion , stress ef 54% (lvef 45-54%)  . CHOLECYSTECTOMY N/A 09/29/2014   Procedure: LAPAROSCOPIC CHOLECYSTECTOMY WITH INTRAOPERATIVE CHOLANGIOGRAM;  Surgeon: Ovidio Kin, MD;  Location: [facility];  Service: General;  Laterality: N/A;  . COLONOSCOPY  last one 09-06-2010  . ESOPHAGOGASTRODUODENOSCOPY N/A 12/09/2013   Procedure: ESOPHAGOGASTRODUODENOSCOPY (EGD);  Surgeon: Willis Modena, MD;  Location: [facility] ENDOSCOPY;  Service: Endoscopy;  Laterality: N/A;  . EXTRACORPOREAL SHOCK WAVE LITHOTRIPSY  yrs ago  . INGUINAL HERNIA REPAIR Left child  .  inguinal hernia repair  09/2017  . NISSEN FUNDOPLICATION  1980's   open  . STONE EXTRACTION WITH BASKET N/A 08/18/2016   Procedure: STONE EXTRACTION WITH BASKET;  Surgeon: Jethro Bolus, MD;  Location: [facility];  Service: Urology;  Laterality: N/A;  . THULIUM LASER TURP (TRANSURETHRAL RESECTION OF PROSTATE) N/A 08/18/2016   Procedure: THULIUM LASER BLADDER NECK INCISION AND BLADDER STONE REMOVAL;  Surgeon: Jethro Bolus, MD;  Location: [facility];  Service: Urology;  Laterality: N/A;  . TONSILLECTOMY  child  . TRANSTHORACIC ECHOCARDIOGRAM  11/18/2010   grade 1 diastolic dysfunction, ef 55-60%/  trivial MR and TR/ mild dilated RA   Social History   Occupational History  . Occupation: teaches constitutional Social worker  . 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 Smoking status: Never Smoker  . Smokeless tobacco: Never Used  Substance and Sexual Activity  . Alcohol use: Yes    Comment: social  . Drug use: No  . Sexual activity: Not on file

## 2020-07-06 NOTE — Addendum Note (Signed)
Addended by: Rise Paganini on: 07/06/2020 08:36 AM   Modules accepted: Orders

## 2020-07-15 ENCOUNTER — Ambulatory Visit
Admission: RE | Admit: 2020-07-15 | Discharge: 2020-07-15 | Disposition: A | Discharge status: Home / Self Care | Payer: Medicare Other | Source: Ambulatory Visit | Attending: Orthopedic Surgery | Admitting: Orthopedic Surgery

## 2020-07-15 ENCOUNTER — Other Ambulatory Visit: Payer: Self-pay

## 2020-07-15 DIAGNOSIS — M79605 Pain in left leg: Secondary | ICD-10-CM

## 2020-07-16 NOTE — Progress Notes (Signed)
I called ok to cx next appt  thx discussed with him that he has sacral stress fracture.  Some pain inducing activity is recommended.  Stationary bike okay.  Would hold off 2 to 3 weeks before doing the [place].  I think he is okay to follow-up as needed.

## 2020-07-22 ENCOUNTER — Other Ambulatory Visit: Payer: Medicare Other

## 2020-07-23 ENCOUNTER — Telehealth: Payer: Self-pay

## 2020-07-23 NOTE — Telephone Encounter (Signed)
Yes  thx

## 2020-07-23 NOTE — Telephone Encounter (Signed)
Ok for this? 

## 2020-07-23 NOTE — Telephone Encounter (Signed)
Pt called and would like a new RX for a high rise walker.

## 2020-07-24 NOTE — Telephone Encounter (Signed)
IC LMVM advising up front

## 2020-07-26 ENCOUNTER — Ambulatory Visit: Payer: Medicare Other | Admitting: Orthopedic Surgery

## 2020-08-03 ENCOUNTER — Other Ambulatory Visit: Payer: Self-pay

## 2020-08-03 ENCOUNTER — Ambulatory Visit (INDEPENDENT_AMBULATORY_CARE_PROVIDER_SITE_OTHER): Payer: Medicare Other | Admitting: Orthopedic Surgery

## 2020-08-03 DIAGNOSIS — M79605 Pain in left leg: Secondary | ICD-10-CM | POA: Diagnosis not present

## 2020-08-03 MED ORDER — CALCITONIN (SALMON) 200 UNIT/ACT NA SOLN: 1.0000 | Freq: Every day | NASAL | 12 refills | Status: DC

## 2020-08-03 MED ORDER — ACETAMINOPHEN-CODEINE #3 300-30 MG PO TABS
ORAL_TABLET | ORAL | 0 refills | Status: DC
Start: 2020-08-03 — End: 2022-05-08

## 2020-08-04 ENCOUNTER — Encounter: Payer: Self-pay | Admitting: Orthopedic Surgery

## 2020-08-04 NOTE — Progress Notes (Signed)
Office Visit Note   Patient: Lee Nelson           Date of Birth: April 09, 1940           MRN: [id number] Visit Date: 08/03/2020 Requested by: Chilton Greathouse, MD [address] [place],  [place] [address] PCP: Chilton Greathouse, MD  Subjective: Chief Complaint  Patient presents with  . Right Hip - Pain  . Left Hip - Pain    HPI: Lee Nelson is a patient with bilateral hip pain.  He states "I had an event.  He states he did not really fall but slid to the ground on some paper.  He was working on taxes with his wife.  Has had some discomfort since then.  Has known sacral insufficiency fracture.  He also reports some new onset of bilateral neuropathic type symptoms in his feet slightly worse on the dorsal compared to the plantar side.  Has a prescription for a rolling walker which is represcribed today because he misplaced the other 1.              ROS: All systems reviewed are negative as they relate to the chief complaint within the history of present illness.  Patient denies  fevers or chills.   Assessment & Plan: Visit Diagnoses:  1. Pain in left leg     Plan: Impression is bilateral hip pain with some foot numbness the patient who had a very low energy event where he slid down to the ground.  This is not an actual fall like he had several weeks ago.  He is able to ambulate.  No red flag symptoms today in terms of fevers chills or saddle paresthesias.  Is having neuropathic type symptoms in his feet but has excellent motor strength in the legs.  No groin pain today.  I am going to refill his pain medicine also that prescription which was filled was lost.  I think calcitonin in may help with some of his pain symptoms from these fragility fractures.  That would not be a long-term symptom and should not give him any type of GI issues particularly short-term.  Expect gradual improvement in his posterior back pain with time.  No indication for further imaging or intervention at this time unless  his symptoms significantly worsen.  May take about 4 to 6 weeks to get him back to his prefall baseline.  Follow-Up Instructions: Return if symptoms worsen or fail to improve.   Orders:  No orders of the defined types were placed in this encounter.  Meds ordered this encounter  Medications  . calcitonin, salmon, (MIACALCIN) 200 UNIT/ACT nasal spray    Sig: Place 1 spray into alternate nostrils daily. X 10 days    Dispense:  3.7 mL    Refill:  12  . acetaminophen-codeine (TYLENOL #3) 300-30 MG tablet    Sig: 1 po q 8 prn pain    Dispense:  30 tablet    Refill:  0      Procedures: No procedures performed   Clinical Data: No additional findings.  Objective: Vital Signs: There were no vitals taken for this visit.  Physical Exam:      Constitutional: Patient appears well-developed HEENT:  Head: Normocephalic Eyes:EOM are normal Neck: Normal range of motion Cardiovascular: Normal rate Pulmonary/chest: Effort normal Neurologic: Patient is alert Skin: Skin is warm Psychiatric: Patient has normal mood and affect    Ortho Exam: Ortho exam demonstrates 5 out of 5 ankle dorsiflexion plantarflexion quad hamstring  strength.  Abduction adduction strength also 5 out of 5.  1+ pitting edema bilateral lower extremities.  Both feet are perfused but he does have some paresthesias in the dorsal greater than plantar aspect of the foot but no paresthesias or differences in sensation in the lateral and medial mid calf regions bilaterally.  No groin pain with internal or external rotation of either leg.  Specialty Comments:  No specialty comments available.  Imaging: No results found.   PMFS History: Patient Active Problem List   Diagnosis Date Noted  . SBO (small bowel obstruction) (HCC) 12/31/2019  . Amaurosis fugax of right eye 08/25/2017  . PSVT (paroxysmal supraventricular tachycardia) (HCC) 09/12/2015  . Chest pain 05/03/2015  . Dyspnea 05/03/2015  . ALLERGIC RHINITIS  09/21/2007  . G E R D 09/21/2007  . SMOKE INHALATION 09/21/2007  . OSTEOPOROSIS 01/28/2007  . PALPITATIONS 01/28/2007  . COUGH 01/28/2007  . ALLERGY 01/28/2007   Past Medical History:  Diagnosis Date  . Anxiety   . Arthritis   . Bladder calculus   . BPH (benign prostatic hyperplasia)   . Chronic constipation   . Complication of anesthesia    " I had some coughing afterwards for a couple of days"--  per pt "perfers spinal anesthesia since general anesthesia congnitive issues when older"  . Diverticulosis of colon   . Dry eye syndrome of both eyes   . Environmental allergies   . GERD (gastroesophageal reflux disease)    occasional  . History of adenomatous polyp of colon    08/ 2004  . History of kidney stones   . History of squamous cell carcinoma in situ (SCCIS) of skin    s/p  excision 2013 facial areas and 06/ 2016 nose  . Migraine    eye migraine occasional  . Seasonal and perennial allergic rhinitis   . Thrombocytopenia (HCC)   . Tingling    hands and feet bilat , intermittantly-- per pt has lumbar bulging disk  . Urinary frequency   . Vocal fold atrophy    dysphonia-- per pt has to drink large amount of water to take even on pill  . Wears glasses     Family History  Problem Relation Age of Onset  . Parkinsonism Brother   . COPD Mother   . Allergies Mother   . Heart failure Mother   . COPD Father   . Stroke Father   . Other Sister   . Suicidality Maternal Aunt   . Cancer Maternal Grandfather     Past Surgical History:  Procedure Laterality Date  . APPENDECTOMY  child  . CARDIOVASCULAR STRESS TEST  05-17-2015  dr hilty   Low risk nuclear study w/ no ischemia/  normal LV function and wall motion , stress ef 54% (lvef 45-54%)  . CHOLECYSTECTOMY N/A 09/29/2014   Procedure: LAPAROSCOPIC CHOLECYSTECTOMY WITH INTRAOPERATIVE CHOLANGIOGRAM;  Surgeon: Ovidio Kin, MD;  Location: [facility];  Service: General;  Laterality: N/A;  . COLONOSCOPY  last one 09-06-2010  .  ESOPHAGOGASTRODUODENOSCOPY N/A 12/09/2013   Procedure: ESOPHAGOGASTRODUODENOSCOPY (EGD);  Surgeon: Willis Modena, MD;  Location[facility] ENDOSCOPY;  Service: Endoscopy;  Laterality: N/A;  . EXTRACORPOREAL SHOCK WAVE LITHOTRIPSY  yrs ago  . INGUINAL HERNIA REPAIR Left child  . inguinal hernia repair  09/2017  . NISSEN FUNDOPLICATION  1980's   open  . STONE EXTRACTION WITH BASKET N/A 08/18/2016   Procedure: STONE EXTRACTION WITH BASKET;  Surgeon: Jethro Bolus, MD;  Location: [facility];  Service: Urology;  Laterality: N/A;  . THULIUM LASER TURP (TRANSURETHRAL RESECTION OF PROSTATE) N/A 08/18/2016   Procedure: THULIUM LASER BLADDER NECK INCISION AND BLADDER STONE REMOVAL;  Surgeon: Jethro Bolus, MD;  Location: [facility];  Service: Urology;  Laterality: N/A;  . TONSILLECTOMY  child  . TRANSTHORACIC ECHOCARDIOGRAM  11/18/2010   grade 1 diastolic dysfunction, ef 55-60%/  trivial MR and TR/ mild dilated RA   Social History   Occupational History  . Occupation: teaches constitutional Social worker  . 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 Smoking status: Never Smoker  . Smokeless tobacco: Never Used  Substance and Sexual Activity  . Alcohol use: Yes    Comment: social  . Drug use: No  . Sexual activity: Not on file

## 2020-08-06 ENCOUNTER — Telehealth: Payer: Self-pay | Admitting: Orthopedic Surgery

## 2020-08-06 NOTE — Telephone Encounter (Signed)
Pt called and states that he has fallen on his butt and is in a lot of pain. He would ike to know if he could get in for an appt tomorrow. He said he's been just using tylenol for now.

## 2020-08-06 NOTE — Telephone Encounter (Signed)
Spoke with patient. Appt scheduled

## 2020-08-10 ENCOUNTER — Ambulatory Visit (INDEPENDENT_AMBULATORY_CARE_PROVIDER_SITE_OTHER): Payer: Medicare Other

## 2020-08-10 ENCOUNTER — Ambulatory Visit (INDEPENDENT_AMBULATORY_CARE_PROVIDER_SITE_OTHER): Payer: Medicare Other | Admitting: Orthopedic Surgery

## 2020-08-10 DIAGNOSIS — M25552 Pain in left hip: Secondary | ICD-10-CM

## 2020-08-10 DIAGNOSIS — M25551 Pain in right hip: Secondary | ICD-10-CM

## 2020-08-11 ENCOUNTER — Encounter: Payer: Self-pay | Admitting: Orthopedic Surgery

## 2020-08-11 NOTE — Progress Notes (Addendum)
Office Visit Note   Patient: Lee Nelson           Date of Birth: 02-25-1941           MRN: [id number] Visit Date: 08/10/2020 Requested by: Chilton Greathouse, MD [address] [place],  [place] [address] PCP: Chilton Greathouse, MD  Subjective: Chief Complaint  Patient presents with  . Other     Bilateral hip pain s/p fall 2 days ago    HPI: [name] is a 80 year old patient who had a fall 2 days ago.  He was reaching between cabinets and lost his balance and fell backwards.  When he fell backwards he landed with his ischial tuberosities on a type of bar that goes across a region of the room.  Did not land on the carpeted floor.  Thinks he may have some positional vertigo.  He also had an injury several weeks ago which was evaluated.  He is also dealing with a sacral insufficiency fracture.  He states his pain is worse than it was several weeks ago.  He is able to ambulate and weight-bear with a cane to get into the office.              ROS: All systems reviewed are negative as they relate to the chief complaint within the history of present illness.  Patient denies  fevers or chills.   Assessment & Plan: Visit Diagnoses:  1. Bilateral hip pain     Plan: Impression is bone bruising ischial tuberosities with no loss of hamstring function.  No bruising around the buttocks area.  No groin pain and no fractures on radiographs.  This looks like a bone bruise.  I think it should improve but if it does not over the next 4 to 6 weeks we could always consider further MRI imaging.  I did tell him though that even if that an occult fracture is present it would likely not change management which would be progressive weightbearing as tolerated.  Of course that would only be in the case of a pelvic ring fracture and not an actual hip fracture which she has no physical exam findings to support at this time.  With his positional vertigo and stress reaction and insufficiency fracture in the sacrum along  with new ischial tuberosity bruising it may be advisable to hold off on his flexible sigmoidoscopy for about 6 weeks.  We communicated that to Dr. Willis Modena today.  He is the gastroenterologist from [facility] who is managing that part of [name] symptom presentation Follow-Up Instructions: Return if symptoms worsen or fail to improve.   Orders:  Orders Placed This Encounter  Procedures  . XR HIPS BILAT W OR W/O PELVIS 3-4 VIEWS   No orders of the defined types were placed in this encounter.     Procedures: No procedures performed   Clinical Data: No additional findings.  Objective: Vital Signs: There were no vitals taken for this visit.  Physical Exam:   Constitutional: Patient appears well-developed HEENT:  Head: Normocephalic Eyes:EOM are normal Neck: Normal range of motion Cardiovascular: Normal rate Pulmonary/chest: Effort normal Neurologic: Patient is alert Skin: Skin is warm Psychiatric: Patient has normal mood and affect    Ortho Exam: Ortho exam demonstrates no bruising around the ischial tuberosity region.  No trochanteric tenderness.  No groin pain with internal or external [place] of the leg.  Patient actually has good hip flexion abduction adduction quad and hamstring strength.  He is able to weight-bear  with a cane.  Mild SI joint and posterior superior iliac crest tenderness on the left-hand side compared to the right.  Specialty Comments:  No specialty comments available.  Imaging: XR HIPS BILAT W OR W/O PELVIS 3-4 VIEWS  Result Date: 08/11/2020 AP pelvis lateral bilateral hips reviewed.  No acute fractures present no significant arthritis present in the hip joints.  Bones appear slightly osteopenic.    PMFS History: Patient Active Problem List   Diagnosis Date Noted  . SBO (small bowel obstruction) (HCC) 12/31/2019  . Amaurosis fugax of right eye 08/25/2017  . PSVT (paroxysmal supraventricular tachycardia) (HCC) 09/12/2015  . Chest pain  05/03/2015  . Dyspnea 05/03/2015  . ALLERGIC RHINITIS 09/21/2007  . G E R D 09/21/2007  . SMOKE INHALATION 09/21/2007  . OSTEOPOROSIS 01/28/2007  . PALPITATIONS 01/28/2007  . COUGH 01/28/2007  . ALLERGY 01/28/2007   Past Medical History:  Diagnosis Date  . Anxiety   . Arthritis   . Bladder calculus   . BPH (benign prostatic hyperplasia)   . Chronic constipation   . Complication of anesthesia    " I had some coughing afterwards for a couple of days"--  per pt "perfers spinal anesthesia since general anesthesia congnitive issues when older"  . Diverticulosis of colon   . Dry eye syndrome of both eyes   . Environmental allergies   . GERD (gastroesophageal reflux disease)    occasional  . History of adenomatous polyp of colon    08/ 2004  . History of kidney stones   . History of squamous cell carcinoma in situ (SCCIS) of skin    s/p  excision 2013 facial areas and 06/ 2016 nose  . Migraine    eye migraine occasional  . Seasonal and perennial allergic rhinitis   . Thrombocytopenia (HCC)   . Tingling    hands and feet bilat , intermittantly-- per pt has lumbar bulging disk  . Urinary frequency   . Vocal fold atrophy    dysphonia-- per pt has to drink large amount of water to take even on pill  . Wears glasses     Family History  Problem Relation Age of Onset  . Parkinsonism Brother   . COPD Mother   . Allergies Mother   . Heart failure Mother   . COPD Father   . Stroke Father   . Other Sister   . Suicidality Maternal Aunt   . Cancer Maternal Grandfather     Past Surgical History:  Procedure Laterality Date  . APPENDECTOMY  child  . CARDIOVASCULAR STRESS TEST  05-17-2015  dr hilty   Low risk nuclear study w/ no ischemia/  normal LV function and wall motion , stress ef 54% (lvef 45-54%)  . CHOLECYSTECTOMY N/A 09/29/2014   Procedure: LAPAROSCOPIC CHOLECYSTECTOMY WITH INTRAOPERATIVE CHOLANGIOGRAM;  Surgeon: Ovidio Kin, MD;  Location: [facility];  Service: General;   Laterality: N/A;  . COLONOSCOPY  last one 09-06-2010  . ESOPHAGOGASTRODUODENOSCOPY N/A 12/09/2013   Procedure: ESOPHAGOGASTRODUODENOSCOPY (EGD);  Surgeon: Willis Modena, MD;  Location: [facility] ENDOSCOPY;  Service: Endoscopy;  Laterality: N/A;  . EXTRACORPOREAL SHOCK WAVE LITHOTRIPSY  yrs ago  . INGUINAL HERNIA REPAIR Left child  . inguinal hernia repair  09/2017  . NISSEN FUNDOPLICATION  1980's   open  . STONE EXTRACTION WITH BASKET N/A 08/18/2016   Procedure: STONE EXTRACTION WITH BASKET;  Surgeon: Jethro Bolus, MD;  Location: [facility];  Service: Urology;  Laterality: N/A;  . THULIUM LASER TURP (TRANSURETHRAL RESECTION  OF PROSTATE) N/A 08/18/2016   Procedure: THULIUM LASER BLADDER NECK INCISION AND BLADDER STONE REMOVAL;  Surgeon: Jethro Bolus, MD;  Location: [facility];  Service: Urology;  Laterality: N/A;  . TONSILLECTOMY  child  . TRANSTHORACIC ECHOCARDIOGRAM  11/18/2010   grade 1 diastolic dysfunction, ef 55-60%/  trivial MR and TR/ mild dilated RA   Social History   Occupational History  . Occupation: teaches constitutional Social worker  . 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 Smoking status: Never Smoker  . Smokeless tobacco: Never Used  Substance and Sexual Activity  . Alcohol use: Yes    Comment: social  . Drug use: No  . Sexual activity: Not on file

## 2020-08-22 ENCOUNTER — Telehealth: Payer: Self-pay

## 2020-08-22 DIAGNOSIS — M25551 Pain in right hip: Secondary | ICD-10-CM

## 2020-08-22 DIAGNOSIS — M25552 Pain in left hip: Secondary | ICD-10-CM

## 2020-08-22 NOTE — Telephone Encounter (Signed)
Mri pelvis eval fem fx thx

## 2020-08-22 NOTE — Telephone Encounter (Signed)
I called pt and advised of plan. He was ok with this. Ordered placed in chart

## 2020-08-22 NOTE — Telephone Encounter (Signed)
Can you please advise? Do you need to see patient back in the office? Order MRI?

## 2020-08-22 NOTE — Addendum Note (Signed)
Addended by: Barbette Or on: 08/22/2020 05:46 PM   Modules accepted: Orders

## 2020-08-22 NOTE — Telephone Encounter (Signed)
Patient called he stated he is having additional pain in his groin/pelvic area patient stated he doesn't have pain when he stands straight but he does have pain if he moves side to side patient has a appointment scheduled 6/3 with [name] he is requesting a call back:[phone]

## 2020-08-24 ENCOUNTER — Other Ambulatory Visit: Payer: Self-pay | Admitting: Physician Assistant

## 2020-08-24 DIAGNOSIS — R1310 Dysphagia, unspecified: Secondary | ICD-10-CM

## 2020-08-24 DIAGNOSIS — K219 Gastro-esophageal reflux disease without esophagitis: Secondary | ICD-10-CM

## 2020-08-29 ENCOUNTER — Ambulatory Visit
Admission: RE | Admit: 2020-08-29 | Discharge: 2020-08-29 | Disposition: A | Discharge status: Home / Self Care | Payer: Medicare Other | Source: Ambulatory Visit | Attending: Orthopedic Surgery | Admitting: Orthopedic Surgery

## 2020-08-29 DIAGNOSIS — M25551 Pain in right hip: Secondary | ICD-10-CM

## 2020-08-29 DIAGNOSIS — M25552 Pain in left hip: Secondary | ICD-10-CM

## 2020-08-31 ENCOUNTER — Ambulatory Visit: Payer: Medicare Other | Admitting: Surgical

## 2020-09-01 NOTE — Progress Notes (Signed)
I called.

## 2020-09-11 ENCOUNTER — Ambulatory Visit
Admission: RE | Admit: 2020-09-11 | Discharge: 2020-09-11 | Disposition: A | Discharge status: Home / Self Care | Payer: Medicare Other | Source: Ambulatory Visit | Attending: Physician Assistant | Admitting: Physician Assistant

## 2020-09-11 ENCOUNTER — Other Ambulatory Visit: Payer: Self-pay | Admitting: Physician Assistant

## 2020-09-11 DIAGNOSIS — R1310 Dysphagia, unspecified: Secondary | ICD-10-CM

## 2020-09-11 DIAGNOSIS — K219 Gastro-esophageal reflux disease without esophagitis: Secondary | ICD-10-CM

## 2020-09-12 ENCOUNTER — Ambulatory Visit: Payer: Medicare Other | Admitting: Orthopedic Surgery

## 2020-09-18 ENCOUNTER — Other Ambulatory Visit: Payer: Self-pay | Admitting: Gastroenterology

## 2020-09-18 ENCOUNTER — Ambulatory Visit
Admission: RE | Admit: 2020-09-18 | Discharge: 2020-09-18 | Disposition: A | Discharge status: Home / Self Care | Payer: Medicare Other | Source: Ambulatory Visit | Attending: Gastroenterology | Admitting: Gastroenterology

## 2020-09-18 DIAGNOSIS — K921 Melena: Secondary | ICD-10-CM

## 2020-09-18 DIAGNOSIS — K5900 Constipation, unspecified: Secondary | ICD-10-CM

## 2020-09-18 DIAGNOSIS — R1030 Lower abdominal pain, unspecified: Secondary | ICD-10-CM

## 2020-09-24 ENCOUNTER — Telehealth: Payer: Self-pay

## 2020-09-24 NOTE — Telephone Encounter (Signed)
Patient called he stated he fell Friday and he is having pain in his buttocks/ groin area patient stated he has noticed there has been a change in his urinary pattern, patient is requesting to be worked into Dr.Deans schedule if possible call back:[phone]

## 2020-09-25 ENCOUNTER — Telehealth: Payer: Self-pay | Admitting: Orthopedic Surgery

## 2020-09-25 NOTE — Telephone Encounter (Signed)
Tried calling patient. No answer. LMVM advising was returning his call.

## 2020-09-25 NOTE — Telephone Encounter (Signed)
Patient returned call asked for a call back      [phone] cell or [phone]

## 2020-09-26 ENCOUNTER — Ambulatory Visit (INDEPENDENT_AMBULATORY_CARE_PROVIDER_SITE_OTHER): Payer: Medicare Other

## 2020-09-26 ENCOUNTER — Other Ambulatory Visit: Payer: Self-pay

## 2020-09-26 ENCOUNTER — Ambulatory Visit (INDEPENDENT_AMBULATORY_CARE_PROVIDER_SITE_OTHER): Payer: Medicare Other | Admitting: Orthopedic Surgery

## 2020-09-26 DIAGNOSIS — M79604 Pain in right leg: Secondary | ICD-10-CM

## 2020-09-26 NOTE — Telephone Encounter (Signed)
Patient coming in this afternoon

## 2020-09-29 ENCOUNTER — Encounter: Payer: Self-pay | Admitting: Orthopedic Surgery

## 2020-09-29 NOTE — Progress Notes (Signed)
Office Visit Note   Patient: Lee Nelson           Date of Birth: 01-13-1941           MRN: [id number] Visit Date: 09/26/2020 Requested by: Chilton Greathouse, MD [address] [place],  [place] [address] PCP: Chilton Greathouse, MD  Subjective: Chief Complaint  Patient presents with   Right Hip - Pain   Lower Back - Pain    HPI: Lee Nelson is a 80 year old patient who has been having falls recently.  Had a low-energy fall 2 days ago.  He is unclear exactly about how he fell whether he was forward or backwards.  States he did hit the anterior part of his right knee but then also fell potentially on his buttocks.  He has been able to walk around with a cane since that time.  Is requesting another prescription for elevated rolling walker.              ROS: All systems reviewed are negative as they relate to the chief complaint within the history of present illness.  Patient denies  fevers or chills.   Assessment & Plan: Visit Diagnoses:  1. Pain in right leg     Plan: Impression is right hip and buttock pain with no evidence of fracture.  Questionable small amount of compression in the L5 vertebral body but it would be 10% at most and that is even an equivocal call because of some scoliosis that he has.  Based on his clinical exam and radiographs I do not think there is any intervention to be done at this time.  Not having too much in the way of back pain either so I doubt that he has a compression fracture.  For now we are going to continue to observe.  Prescription written for rolling walker elevated.  Follow-up as needed.  Follow-Up Instructions: Return if symptoms worsen or fail to improve.   Orders:  Orders Placed This Encounter  Procedures   XR HIP UNILAT W OR W/O PELVIS 2-3 VIEWS RIGHT   XR Lumbar Spine 2-3 Views   No orders of the defined types were placed in this encounter.     Procedures: No procedures performed   Clinical Data: No additional  findings.  Objective: Vital Signs: There were no vitals taken for this visit.  Physical Exam:   Constitutional: Patient appears well-developed HEENT:  Head: Normocephalic Eyes:EOM are normal Neck: Normal range of motion Cardiovascular: Normal rate Pulmonary/chest: Effort normal Neurologic: Patient is alert Skin: Skin is warm Psychiatric: Patient has normal mood and affect   Ortho Exam: Ortho exam demonstrates no groin pain with internal or external Tatian of the leg.  No discrete tenderness around the right knee with no effusion.  No nerve root tension signs.  Patient has good ankle dorsiflexion plantarflexion strength.  No saddle paresthesias.  No hip flexor weakness.  Specialty Comments:  No specialty comments available.  Imaging: No results found.   PMFS History: Patient Active Problem List   Diagnosis Date Noted   SBO (small bowel obstruction) (HCC) 12/31/2019   Amaurosis fugax of right eye 08/25/2017   PSVT (paroxysmal supraventricular tachycardia) (HCC) 09/12/2015   Chest pain 05/03/2015   Dyspnea 05/03/2015   ALLERGIC RHINITIS 09/21/2007   G E R D 09/21/2007   SMOKE INHALATION 09/21/2007   OSTEOPOROSIS 01/28/2007   PALPITATIONS 01/28/2007   COUGH 01/28/2007   ALLERGY 01/28/2007   Past Medical History:  Diagnosis Date   Anxiety  Arthritis    Bladder calculus    BPH (benign prostatic hyperplasia)    Chronic constipation    Complication of anesthesia    " I had some coughing afterwards for a couple of days"--  per pt "perfers spinal anesthesia since general anesthesia congnitive issues when older"   Diverticulosis of colon    Dry eye syndrome of both eyes    Environmental allergies    GERD (gastroesophageal reflux disease)    occasional   History of adenomatous polyp of colon    08/ 2004   History of kidney stones    History of squamous cell carcinoma in situ (SCCIS) of skin    s/p  excision 2013 facial areas and 06/ 2016 nose   Migraine    eye  migraine occasional   Seasonal and perennial allergic rhinitis    Thrombocytopenia (HCC)    Tingling    hands and feet bilat , intermittantly-- per pt has lumbar bulging disk   Urinary frequency    Vocal fold atrophy    dysphonia-- per pt has to drink large amount of water to take even on pill   Wears glasses     Family History  Problem Relation Age of Onset   Parkinsonism Brother    COPD Mother    Allergies Mother    Heart failure Mother    COPD Father    Stroke Father    Other Sister    Suicidality Maternal Aunt    Cancer Maternal Grandfather     Past Surgical History:  Procedure Laterality Date   APPENDECTOMY  child   CARDIOVASCULAR STRESS TEST  05-17-2015  dr hilty   Low risk nuclear study w/ no ischemia/  normal LV function and wall motion , stress ef 54% (lvef 45-54%)   CHOLECYSTECTOMY N/A 09/29/2014   Procedure: LAPAROSCOPIC CHOLECYSTECTOMY WITH INTRAOPERATIVE CHOLANGIOGRAM;  Surgeon: Ovidio Kin, MD;  Location: [facility];  Service: General;  Laterality: N/A;   COLONOSCOPY  last one 09-06-2010   ESOPHAGOGASTRODUODENOSCOPY N/A 12/09/2013   Procedure: ESOPHAGOGASTRODUODENOSCOPY (EGD);  Surgeon: Willis Modena, MD;  Location: [facility];  Service: Endoscopy;  Laterality: N/A;   EXTRACORPOREAL SHOCK WAVE LITHOTRIPSY  yrs ago   INGUINAL HERNIA REPAIR Left child   inguinal hernia repair  09/2017   NISSEN FUNDOPLICATION  1980's   open   STONE EXTRACTION WITH BASKET N/A 08/18/2016   Procedure: STONE EXTRACTION WITH BASKET;  Surgeon: Jethro Bolus, MD;  Location: [facility];  Service: Urology;  Laterality: N/A;   THULIUM LASER TURP (TRANSURETHRAL RESECTION OF PROSTATE) N/A 08/18/2016   Procedure: THULIUM LASER BLADDER NECK INCISION AND BLADDER STONE REMOVAL;  Surgeon: Jethro Bolus, MD;  Location: [facility];  Service: Urology;  Laterality: N/A;   TONSILLECTOMY  child   TRANSTHORACIC ECHOCARDIOGRAM  11/18/2010   grade 1 diastolic  dysfunction, ef 55-60%/  trivial MR and TR/ mild dilated RA   Social History   Occupational History   Occupation: teaches constitutional law   Occupation: Photographer: [employer]  Tobacco Use   Smoking status: Never   Smokeless tobacco: Never  Substance and Sexual Activity   Alcohol use: Yes    Comment: social   Drug use: No   Sexual activity: Not on file

## 2020-10-23 ENCOUNTER — Telehealth: Payer: Self-pay

## 2020-10-23 NOTE — Telephone Encounter (Signed)
Hmmmm.. ok 

## 2020-10-23 NOTE — Telephone Encounter (Signed)
Pt called into the office stating that he fell over the weekend and hurting in his right leg. He wanted to know if he can be seen sooner than aug 5th. Or if there is anything that he should be doing.

## 2020-11-02 ENCOUNTER — Ambulatory Visit (INDEPENDENT_AMBULATORY_CARE_PROVIDER_SITE_OTHER): Payer: Medicare Other

## 2020-11-02 ENCOUNTER — Other Ambulatory Visit: Payer: Self-pay

## 2020-11-02 ENCOUNTER — Encounter: Payer: Self-pay | Admitting: Surgical

## 2020-11-02 ENCOUNTER — Ambulatory Visit (INDEPENDENT_AMBULATORY_CARE_PROVIDER_SITE_OTHER): Payer: Medicare Other | Admitting: Surgical

## 2020-11-02 DIAGNOSIS — M25551 Pain in right hip: Secondary | ICD-10-CM

## 2020-11-02 DIAGNOSIS — M545 Low back pain, unspecified: Secondary | ICD-10-CM

## 2020-11-02 DIAGNOSIS — R296 Repeated falls: Secondary | ICD-10-CM | POA: Diagnosis not present

## 2020-11-02 NOTE — Progress Notes (Signed)
Office Visit Note   Patient: Lee Nelson           Date of Birth: Apr 10, 1940           MRN: [id number] Visit Date: 11/02/2020 Requested by: Chilton Greathouse, MD [address] [place],  [place] [address] PCP: Chilton Greathouse, MD  Subjective: Chief Complaint  Patient presents with   Lower Back - Pain   Right Hip - Pain    HPI: HASSEL UPHOFF is a 80 y.o. male who presents to the office complaining of hip/right buttock/low back pain.  Patient states that he lost his balance and fell, striking his right buttock against the armrest of the chair about 4 to 6 weeks ago.  He was also having some right anterior thigh pain after the incident but this is now resolved.  His pain is improving slowly but steadily.  Localizes most of his pain to the right buttocks and states that it is painful to sit for long periods of time but nothing that is debilitating.  He does not wake with pain at night.  He is able to ambulate and weight-bear with minimal pain.  He has no new gait abnormality.  He has not received the platform walker yet that he was given prescription for at his last appointment, instead just ambulating with a cane still.  His exercise routine consists of stationary bike riding for 10 to 15 minutes about 3-4 times per week as well as occasional cross-country skiing machine for 5 to 10 minutes a couple times a week.  He has had multiple falls over the last several months with negative radiographs with each fall.  He notes increasing unsteadiness and trouble with balance.  He feels that every time he walks through a doorway, he is likely to hit into either side of the doorway.  He has had good success seeking treatment with [name] in the past for balance issues.  He also notes what sounds like a tic of his right hand where he will involuntarily have the right wrist extend at times and he is not sure why this is.  He has noticed this in the last several months..                ROS:  All systems reviewed are negative as they relate to the chief complaint within the history of present illness.  Patient denies fevers or chills.  Assessment & Plan: Visit Diagnoses:  1. Pain in right hip   2. Low back pain, unspecified back pain laterality, unspecified chronicity, unspecified whether sciatica present   3. Falls frequently     Plan: Patient is a 80 year old male who presents with recent history of multiple falls.  He has had a recent fall about a month ago and buttock and low back pain since that fall.  He has not received his platform walker yet.  Today's radiographs are negative for any identifiable fracture.  However, concerned with patient's increasing instability and troubles with balance.  Only takes 1 fall for a potentially disastrous injury.  With this in mind, recommended physical therapy routine to optimize his balance and strength.  He is also had good experience at [facility] with Dr. Geraldine Contras.  Plan to refer him to [facility] neuro for evaluation of worsening balance and multiple falls.  In the meantime, recommended he start working with PT and obtain a platform walker.  Follow-up in 6 weeks for clinical recheck with Dr. August Saucer to ensure that his buttock  and hip pain are progressively improving or resolved.  Follow-Up Instructions: No follow-ups on file.   Orders:  Orders Placed This Encounter  Procedures   XR HIP UNILAT W OR W/O PELVIS 2-3 VIEWS RIGHT   XR Lumbar Spine 2-3 Views   Ambulatory referral to Neurology   Ambulatory referral to Physical Therapy   No orders of the defined types were placed in this encounter.     Procedures: No procedures performed   Clinical Data: No additional findings.  Objective: Vital Signs: There were no vitals taken for this visit.  Physical Exam:  Constitutional: Patient appears well-developed HEENT:  Head: Normocephalic Eyes:EOM are normal Neck: Normal range of motion Cardiovascular: Normal  rate Pulmonary/chest: Effort normal Neurologic: Patient is alert Skin: Skin is warm Psychiatric: Patient has normal mood and affect  Ortho Exam: Ortho exam demonstrates no significant pain with hip range of motion.  He has minimal tenderness over either trochanter.  Mild tenderness over the right buttock but no significant focal area of tenderness.  No significant tenderness throughout the axial lumbar spine.  He is ambulatory with his cane and has no Trendelenburg gait.  Neither lower extremity is abnormally externally or internally rotated or shortened.  Specialty Comments:  No specialty comments available.  Imaging: No results found.   PMFS History: Patient Active Problem List   Diagnosis Date Noted   SBO (small bowel obstruction) (HCC) 12/31/2019   Amaurosis fugax of right eye 08/25/2017   PSVT (paroxysmal supraventricular tachycardia) (HCC) 09/12/2015   Chest pain 05/03/2015   Dyspnea 05/03/2015   ALLERGIC RHINITIS 09/21/2007   G E R D 09/21/2007   SMOKE INHALATION 09/21/2007   OSTEOPOROSIS 01/28/2007   PALPITATIONS 01/28/2007   COUGH 01/28/2007   ALLERGY 01/28/2007   Past Medical History:  Diagnosis Date   Anxiety    Arthritis    Bladder calculus    BPH (benign prostatic hyperplasia)    Chronic constipation    Complication of anesthesia    " I had some coughing afterwards for a couple of days"--  per pt "perfers spinal anesthesia since general anesthesia congnitive issues when older"   Diverticulosis of colon    Dry eye syndrome of both eyes    Environmental allergies    GERD (gastroesophageal reflux disease)    occasional   History of adenomatous polyp of colon    08/ 2004   History of kidney stones    History of squamous cell carcinoma in situ (SCCIS) of skin    s/p  excision 2013 facial areas and 06/ 2016 nose   Migraine    eye migraine occasional   Seasonal and perennial allergic rhinitis    Thrombocytopenia (HCC)    Tingling    hands and feet bilat ,  intermittantly-- per pt has lumbar bulging disk   Urinary frequency    Vocal fold atrophy    dysphonia-- per pt has to drink large amount of water to take even on pill   Wears glasses     Family History  Problem Relation Age of Onset   Parkinsonism Brother    COPD Mother    Allergies Mother    Heart failure Mother    COPD Father    Stroke Father    Other Sister    Suicidality Maternal Aunt    Cancer Maternal Grandfather     Past Surgical History:  Procedure Laterality Date   APPENDECTOMY  child   CARDIOVASCULAR STRESS TEST  05-17-2015  dr Rennis Golden  Low risk nuclear study w/ no ischemia/  normal LV function and wall motion , stress ef 54% (lvef 45-54%)   CHOLECYSTECTOMY N/A 09/29/2014   Procedure: LAPAROSCOPIC CHOLECYSTECTOMY WITH INTRAOPERATIVE CHOLANGIOGRAM;  Surgeon: Ovidio Kin, MD;  Location: [facility];  Service: General;  Laterality: N/A;   COLONOSCOPY  last one 09-06-2010   ESOPHAGOGASTRODUODENOSCOPY N/A 12/09/2013   Procedure: ESOPHAGOGASTRODUODENOSCOPY (EGD);  Surgeon: Willis Modena, MD;  Location: [facility];  Service: Endoscopy;  Laterality: N/A;   EXTRACORPOREAL SHOCK WAVE LITHOTRIPSY  yrs ago   INGUINAL HERNIA REPAIR Left child   inguinal hernia repair  09/2017   NISSEN FUNDOPLICATION  1980's   open   STONE EXTRACTION WITH BASKET N/A 08/18/2016   Procedure: STONE EXTRACTION WITH BASKET;  Surgeon: Jethro Bolus, MD;  Location: [facility];  Service: Urology;  Laterality: N/A;   THULIUM LASER TURP (TRANSURETHRAL RESECTION OF PROSTATE) N/A 08/18/2016   Procedure: THULIUM LASER BLADDER NECK INCISION AND BLADDER STONE REMOVAL;  Surgeon: Jethro Bolus, MD;  Location: [facility];  Service: Urology;  Laterality: N/A;   TONSILLECTOMY  child   TRANSTHORACIC ECHOCARDIOGRAM  11/18/2010   grade 1 diastolic dysfunction, ef 55-60%/  trivial MR and TR/ mild dilated RA   Social History   Occupational History   Occupation: teaches  constitutional law   Occupation: Photographer: [employer]  Tobacco Use   Smoking status: Never   Smokeless tobacco: Never  Substance and Sexual Activity   Alcohol use: Yes    Comment: social   Drug use: No   Sexual activity: Not on file

## 2020-11-14 ENCOUNTER — Telehealth: Payer: Self-pay | Admitting: Surgical

## 2020-11-14 DIAGNOSIS — M25551 Pain in right hip: Secondary | ICD-10-CM

## 2020-11-14 NOTE — Telephone Encounter (Signed)
Please advise 

## 2020-11-14 NOTE — Telephone Encounter (Signed)
Patient called advised he is still having pain in his pelvic area and would like to be set up for the CT scan. Patient asked that [facility] call him. The number to contact patient is [phone] or [phone]

## 2020-11-15 NOTE — Telephone Encounter (Signed)
Scan ordered. Tried calling patient to advise. No answer.

## 2020-11-15 NOTE — Telephone Encounter (Signed)
I tried to reach patient from [facility] office with no answer.  Could you call him? Thanks.

## 2020-11-15 NOTE — Telephone Encounter (Signed)
Okay for this, can you call and let him know we will order scan

## 2020-11-30 ENCOUNTER — Other Ambulatory Visit: Payer: Self-pay

## 2020-11-30 ENCOUNTER — Ambulatory Visit: Payer: Medicare Other | Attending: Surgical

## 2020-11-30 DIAGNOSIS — R2681 Unsteadiness on feet: Secondary | ICD-10-CM | POA: Diagnosis present

## 2020-11-30 DIAGNOSIS — R2689 Other abnormalities of gait and mobility: Secondary | ICD-10-CM | POA: Insufficient documentation

## 2020-11-30 DIAGNOSIS — R296 Repeated falls: Secondary | ICD-10-CM | POA: Diagnosis present

## 2020-11-30 NOTE — Therapy (Signed)
[facility] [address] [place], [place], [address] Phone: [phone]   Fax:  [phone]  Physical Therapy Evaluation  Patient Details  Name: Lee Nelson MRN: [id number] Date of Birth: [birth year] Referring Provider (PT): [name] PA   Encounter Date: 11/30/2020   PT End of Session - 11/30/20 1030     Visit Number 1    Number of Visits 13    Date for PT Re-Evaluation 01/18/21    Authorization Type MCR/MCD    Progress Note Due on Visit 10    PT Start Time 1025    PT Stop Time 1100    PT Time Calculation (min) 35 min    Equipment Utilized During Treatment Gait belt    Activity Tolerance Patient tolerated treatment well    Behavior During Therapy WFL for tasks assessed/performed             Past Medical History:  Diagnosis Date   Anxiety    Arthritis    Bladder calculus    BPH (benign prostatic hyperplasia)    Chronic constipation    Complication of anesthesia    " I had some coughing afterwards for a couple of days"--  per pt "perfers spinal anesthesia since general anesthesia congnitive issues when older"   Diverticulosis of colon    Dry eye syndrome of both eyes    Environmental allergies    GERD (gastroesophageal reflux disease)    occasional   History of adenomatous polyp of colon    08/ 2004   History of kidney stones    History of squamous cell carcinoma in situ (SCCIS) of skin    s/p  excision 2013 facial areas and 06/ 2016 nose   Migraine    eye migraine occasional   Seasonal and perennial allergic rhinitis    Thrombocytopenia (HCC)    Tingling    hands and feet bilat , intermittantly-- per pt has lumbar bulging disk   Urinary frequency    Vocal fold atrophy    dysphonia-- per pt has to drink large amount of water to take even on pill   Wears glasses     Past Surgical History:  Procedure Laterality Date   APPENDECTOMY  child   CARDIOVASCULAR STRESS TEST  05-17-2015  dr  hilty   Low risk nuclear study w/ no ischemia/  normal LV function and wall motion , stress ef 54% (lvef 45-54%)   CHOLECYSTECTOMY N/A 09/29/2014   Procedure: LAPAROSCOPIC CHOLECYSTECTOMY WITH INTRAOPERATIVE CHOLANGIOGRAM;  Surgeon: Ovidio Kin, MD;  Location: [facility];  Service: General;  Laterality: N/A;   COLONOSCOPY  last one 09-06-2010   ESOPHAGOGASTRODUODENOSCOPY N/A 12/09/2013   Procedure: ESOPHAGOGASTRODUODENOSCOPY (EGD);  Surgeon: Willis Modena, MD;  Location: [facility] ENDOSCOPY;  Service: Endoscopy;  Laterality: N/A;   EXTRACORPOREAL SHOCK WAVE LITHOTRIPSY  yrs ago   INGUINAL HERNIA REPAIR Left child   inguinal hernia repair  09/2017   NISSEN FUNDOPLICATION  1980's   open   STONE EXTRACTION WITH BASKET N/A 08/18/2016   Procedure: STONE EXTRACTION WITH BASKET;  Surgeon: Jethro Bolus, MD;  Location: [facility];  Service: Urology;  Laterality: N/A;   THULIUM LASER TURP (TRANSURETHRAL RESECTION OF PROSTATE) N/A 08/18/2016   Procedure: THULIUM LASER BLADDER NECK INCISION AND BLADDER STONE REMOVAL;  Surgeon: Jethro Bolus, MD;  Location: [facility];  Service: Urology;  Laterality: N/A;   TONSILLECTOMY  child   TRANSTHORACIC ECHOCARDIOGRAM  11/18/2010   grade 1 diastolic dysfunction, ef  55-60%/  trivial MR and TR/ mild dilated RA    There were no vitals filed for this visit.    Subjective Assessment - 11/30/20 1026     Subjective Patient reports a history of 3 falls this year with unknown cause.  He has had previous OPPT for balance issues and has done well.  he ambulates with a cane but is reluctant to swith to a walker due to lower maneuverability.  Some chronic discomfort in LS region most likely from flexed posture    Pertinent History Patient is a 80 year old male who presents with recent history of multiple falls.  He has had a recent fall about a month ago and buttock and low back pain since that fall.  He has not received his platform walker  yet.  Today's radiographs are negative for any identifiable fracture.  However, concerned with patient's increasing instability and troubles with balance.  Only takes 1 fall for a potentially disastrous injury.  With this in mind, recommended physical therapy routine to optimize his balance and strength.  He is also [facility] Dr[facility] Geraldine ContrasPennumali.  [facility] of worsening balance and multiple falls.  In the meantime, recommended he start working with PT and obtain a platform walker.  Follow-up in 6 weeks for clinical recheck with Dr. August Saucerean to ensure that his buttock and hip pain are progressively improving or resolved.    Limitations Standing;Walking    How long can you sit comfortably? >30 min    How long can you stand comfortably? <30 min    How long can you walk comfortably? <30 min    Patient Stated Goals to improve my balance    Currently in Pain? Yes    Pain Score 2     Pain Location Hip    Pain Orientation Right    Pain Descriptors / Indicators Aching    Pain Type Acute pain    Pain Onset 1 to 4 weeks ago    Aggravating Factors  prolonged positioning    Pain Relieving Factors position changes                [facility] - 11/30/20 0001       Assessment   Medical Diagnosis frequent falls    Referring Provider (PT) [name] PA    Onset Date/Surgical Date 11/07/20    Hand Dominance Right    Prior Therapy OPPT      Precautions   Precautions Fall      Restrictions   Weight Bearing Restrictions No      Balance Screen   Has the patient fallen in the past 6 months Yes    How many times? 3    Has the patient had a decrease in activity level because of a fear of falling?  No    Is the patient reluctant to leave their home because of a fear of falling?  No      Home Environment   Living Environment Private residence    Living Arrangements Spouse/significant other    Type of Home House     Home Access Ramped entrance    Home Layout Two level    Additional Comments wife has a SCI and home is handicapped accessible      Prior Function   Level of Independence Independent with basic ADLs;Independent with household mobility with device    Vocation Retired  Transfers   Transfers Sit to Stand;Stand to Sit    Five time sit to stand comments  17.1    Comments 14.8s norm for age      Ambulation/Gait   Ambulation/Gait Yes    Ambulation/Gait Assistance 5: Supervision    Ambulation Distance (Feet) 100 Feet    Assistive device Straight cane    Gait Pattern Trunk flexed;Right flexed knee in stance;Left flexed knee in stance    Ambulation Surface Level;Indoor      High Level Balance   High Level Balance Comments able to hold positions 1,2,3 for 30s, 15s at position4                        Objective measurements completed on examination: See above findings.               PT Education - 11/30/20 1029     Education Details Patient informed of eval findings, rehab potential and POC and is in agreement    Person(s) Educated Patient    Methods Explanation    Comprehension Verbalized understanding              PT Short Term Goals - 11/30/20 1224       PT SHORT TERM GOAL #1   Title Pt will initiate HEP in order to indicate decreased fall risk and improved functional mobility.    Baseline 5x STS    Time 3    Period Weeks    Status New    Target Date 12/28/20      PT SHORT TERM GOAL #2   Title Assess DGI and establish goal    Baseline TBD    Time 3    Period Weeks    Status New    Target Date 12/28/20      PT SHORT TERM GOAL #3   Title Assess gait velocity and set appropriate goal    Baseline TBD    Time 3    Period Weeks    Status New    Target Date 12/28/20      PT SHORT TERM GOAL #4   Title Pt will ambulate up to 500' at independent level with improved posture using LRAD    Baseline 155ft in clinic with cane    Time 3     Period Weeks    Status New    Target Date 12/28/20               PT Long Term Goals - 11/30/20 1239       PT LONG TERM GOAL #1   Title Patient to ambulate 1072ft with S and LRAD    Baseline 185ft with SPC and S    Time 6    Period Weeks    Status New    Target Date 01/18/21      PT LONG TERM GOAL #2   Title Patient to negotiate 16 steps with most appropriate pattern and support    Baseline TBD    Time 6    Period Weeks    Status New    Target Date 01/18/21      PT LONG TERM GOAL #3   Title patient to comlete 5xSTS in 14s or less as determined by age and gender    Baseline 17.1s w/o UE assist    Time 6    Period Weeks    Status New    Target Date 01/18/21      PT  LONG TERM GOAL #4   Title Patient to demo 0.90 m/s gait velocity which would equate to age and gender norms    Baseline TBD    Time 6    Period Weeks    Status New    Target Date 01/18/21      PT LONG TERM GOAL #5   Title Assess progress towards DGI goal    Baseline TBD    Time 6    Status New    Target Date 01/18/21                    Plan - 11/30/20 1559     Clinical Impression Statement Patient referred to OPPT due to frequent falls, the last resulting in a soft tissue hip injury.  He has been reluctant to transition   Patient able to transfer I and ambulate with a SPC using a flexed posture.  He lives with his spouse who sustained a SCI  and the house has been made handicapped accessible.  He denies dizziness with head turns or position changes but dies not a LOB with directional changes when walking.. 5x STS score is only mildly deficient and he was only able to tolerate 15/30 s on a compliant surface vision removed.  Further testing prohibited by time constraints and will be addressed on subsequent visits.  PAtient is a good candidate for OPPT    Personal Factors and Comorbidities Age;Past/Current Experience    Examination-Activity Limitations Transfers;Stairs;Locomotion Level     Examination-Participation Restrictions Yard Work    Stability/Clinical Decision Making Stable/Uncomplicated    Clinical Decision Making Low    Rehab Potential Good    PT Frequency 2x / week    PT Duration 6 weeks    PT Treatment/Interventions ADLs/Self Care Home Management;Aquatic Therapy;DME Instruction;Gait training;Stair training;Functional mobility training;Therapeutic activities;Therapeutic exercise;Balance training;Neuromuscular re-education;Patient/family education    PT Next Visit Plan DGI and gait velocity, HEP    PT Home Exercise Plan 5x STS    Consulted and Agree with Plan of Care Patient             Patient will benefit from skilled therapeutic intervention in order to improve the following deficits and impairments:  Abnormal gait, Difficulty walking, Decreased endurance, Decreased activity tolerance, Decreased balance, Decreased mobility, Decreased strength  Visit Diagnosis: Balance disorder  Unsteadiness on feet  Repeated falls     Problem List Patient Active Problem List   Diagnosis Date Noted   SBO (small bowel obstruction) (HCC) 12/31/2019   Amaurosis fugax of right eye 08/25/2017   PSVT (paroxysmal supraventricular tachycardia) (HCC) 09/12/2015   Chest pain 05/03/2015   Dyspnea 05/03/2015   ALLERGIC RHINITIS 09/21/2007   G E R D 09/21/2007   SMOKE INHALATION 09/21/2007   OSTEOPOROSIS 01/28/2007   PALPITATIONS 01/28/2007   COUGH 01/28/2007   ALLERGY 01/28/2007    Farris Has Tjuana Vickrey PT 11/30/2020, 4:08 PM  [facility] [address] [place], [place], [address] Phone: [phone]   Fax:  [phone]  Name: DEEGAN VALENTINO MRN: [id number] Date of Birth: 1940-09-29

## 2020-11-30 NOTE — Patient Instructions (Signed)
Perform 5x STS

## 2020-12-04 ENCOUNTER — Other Ambulatory Visit: Payer: Self-pay

## 2020-12-04 ENCOUNTER — Ambulatory Visit: Payer: Medicare Other | Admitting: Physical Therapy

## 2020-12-04 DIAGNOSIS — R2689 Other abnormalities of gait and mobility: Secondary | ICD-10-CM | POA: Diagnosis not present

## 2020-12-04 DIAGNOSIS — R2681 Unsteadiness on feet: Secondary | ICD-10-CM

## 2020-12-04 NOTE — Therapy (Signed)
[facility] [address] [place], [place], [address] Phone: [phone]   Fax:  [phone]  Physical Therapy Treatment  Patient Details  Name: Lee Nelson MRN: [id number] Date of Birth: Jan 06, 1941 Referring Provider (PT): [name] PA   Encounter Date: 12/04/2020   PT End of Session - 12/04/20 1915     Visit Number 2    Number of Visits 13    Date for PT Re-Evaluation 01/18/21    Authorization Type MCR/MCD    Progress Note Due on Visit 10    PT Start Time 1316    PT Stop Time 1402    PT Time Calculation (min) 46 min    Activity Tolerance Patient tolerated treatment well    Behavior During Therapy [facility] for tasks assessed/performed             Past Medical History:  Diagnosis Date   Anxiety    Arthritis    Bladder calculus    BPH (benign prostatic hyperplasia)    Chronic constipation    Complication of anesthesia    " I had some coughing afterwards for a couple of days"--  per pt "perfers spinal anesthesia since general anesthesia congnitive issues when older"   Diverticulosis of colon    Dry eye syndrome of both eyes    Environmental allergies    GERD (gastroesophageal reflux disease)    occasional   History of adenomatous polyp of colon    08/ 2004   History of kidney stones    History of squamous cell carcinoma in situ (SCCIS) of skin    s/p  excision 2013 facial areas and 06/ 2016 nose   Migraine    eye migraine occasional   Seasonal and perennial allergic rhinitis    Thrombocytopenia (HCC)    Tingling    hands and feet bilat , intermittantly-- per pt has lumbar bulging disk   Urinary frequency    Vocal fold atrophy    dysphonia-- per pt has to drink large amount of water to take even on pill   Wears glasses     Past Surgical History:  Procedure Laterality Date   APPENDECTOMY  child   CARDIOVASCULAR STRESS TEST  05-17-2015  dr hilty   Low risk nuclear study w/ no ischemia/   normal LV function and wall motion , stress ef 54% (lvef 45-54%)   CHOLECYSTECTOMY N/A 09/29/2014   Procedure: LAPAROSCOPIC CHOLECYSTECTOMY WITH INTRAOPERATIVE CHOLANGIOGRAM;  Surgeon: Ovidio Kin, MD;  Location: [facility];  Service: General;  Laterality: N/A;   COLONOSCOPY  last one 09-06-2010   ESOPHAGOGASTRODUODENOSCOPY N/A 12/09/2013   Procedure: ESOPHAGOGASTRODUODENOSCOPY (EGD);  Surgeon: Willis Modena, MD;  Location[facility];  Service: Endoscopy;  Laterality: N/A;   EXTRACORPOREAL SHOCK WAVE LITHOTRIPSY  yrs ago   INGUINAL HERNIA REPAIR Left child   inguinal hernia repair  09/2017   NISSEN FUNDOPLICATION  1980's   open   STONE EXTRACTION WITH BASKET N/A 08/18/2016   Procedure: STONE EXTRACTION WITH BASKET;  Surgeon: Jethro Bolus, MD;  Location: [facility];  Service: Urology;  Laterality: N/A;   THULIUM LASER TURP (TRANSURETHRAL RESECTION OF PROSTATE) N/A 08/18/2016   Procedure: THULIUM LASER BLADDER NECK INCISION AND BLADDER STONE REMOVAL;  Surgeon: Jethro Bolus, MD;  Location: [facility];  Service: Urology;  Laterality: N/A;   TONSILLECTOMY  child   TRANSTHORACIC ECHOCARDIOGRAM  11/18/2010   grade 1 diastolic dysfunction, ef 55-60%/  trivial MR and TR/ mild dilated RA  There were no vitals filed for this visit.   Subjective Assessment - 12/04/20 1322     Subjective Pt requests printouts of exercises he can do at home - states he was instructed to do one exercise (5x sit to stand) but was not given a picture of it; pt states he has difficulty maintaining balance with turning    Pertinent History Patient is a 80 year old male who presents with recent history of multiple falls.  He has had a recent fall about a month ago and buttock and low back pain since that fall.  He has not received his platform walker yet.  Today's radiographs are negative for any identifiable fracture.  However, concerned with patient's increasing instability and  troubles with balance.  Only takes 1 fall for a potentially disastrous injury.  With this in mind, recommended physical therapy routine to optimize his balance and strength.  He is also [facility] Dr. Geraldine ContrasPennumali.  Plan [facility] for [facility] of worsening balance and multiple falls.  In the meantime, recommended he start working with PT and obtain a platform walker.  Follow-up in 6 weeks for clinical recheck with Dr. August Saucerean to ensure that his buttock and hip pain are progressively improving or resolved.    Limitations Standing;Walking    How long can you sit comfortably? >30 min    How long can you stand comfortably? <30 min    How long can you walk comfortably? <30 min    Patient Stated Goals to improve my balance    Currently in Pain? Yes    Pain Score 3     Pain Location Hip    Pain Orientation Right;Lateral    Pain Descriptors / Indicators Aching;Discomfort;Tender    Pain Type Chronic pain    Pain Onset 1 to 4 weeks ago    Pain Frequency Constant                         [facility] Adult PT Treatment/Exercise - 12/04/20 1328       Transfers   Transfers Sit to Stand;Stand to Sit    Sit to Stand 5: Supervision    Stand to Sit 5: Supervision    Number of Reps Other reps (comment)   5   Comments pt crossed arms across chest to avoid use during transfer      Ambulation/Gait   Ambulation/Gait Yes    Ambulation/Gait Assistance 6: Modified independent (Device/Increase time)    Ambulation Distance (Feet) 100 Feet    Assistive device Straight cane    Gait Pattern Trunk flexed;Right flexed knee in stance;Left flexed knee in stance    Ambulation Surface Level;Indoor               Balance Exercises - 12/04/20 0001       Balance Exercises: Standing   Partial Tandem Stance Eyes open;4 reps;Intermittent upper extremity support;30 secs   each position 2 reps with minimal UE support on counter   Sidestepping 2 reps    along counter - no UE support used   Marching Solid surface;Static;10 reps   each leg   Sit to Stand Standard surface;Without upper extremity support;Other (comment);Foam/compliant surface   5 reps   Other Standing Exercises Pt performed standing kicks - forward 10 reps each leg, backward 5 reps each leg due to c/o Rt hip pain and side kicks alternating 5 reps each leg due to c/o Rt  hip discomfort    Other Standing Exercises Comments Pt performed 2 reps crossovers front along counter (approx. 10') 4 reps and then stepping behind 10' x 4 reps with verbal and tactile cues for correct sequence with UE support for balance            Med[employer] [id number] - issued for HEP - see exercises listed above; did not issue crossovers front nor back for HEP     PT Education - 12/04/20 1912     Education Details [employer] [id number]    Person(s) Educated Patient    Methods Explanation;Demonstration;Handout    Comprehension Verbalized understanding;Returned demonstration              PT Short Term Goals - 12/04/20 1916       PT SHORT TERM GOAL #1   Title Pt will initiate HEP in order to indicate decreased fall risk and improved functional mobility.    Baseline 5x STS    Time 3    Period Weeks    Status New    Target Date 12/28/20      PT SHORT TERM GOAL #2   Title Assess DGI and establish goal    Baseline TBD    Time 3    Period Weeks    Status New    Target Date 12/28/20      PT SHORT TERM GOAL #3   Title Assess gait velocity and set appropriate goal    Baseline TBD    Time 3    Period Weeks    Status New    Target Date 12/28/20      PT SHORT TERM GOAL #4   Title Pt will ambulate up to 500' at independent level with improved posture using LRAD    Baseline 136ft in clinic with cane    Time 3    Period Weeks    Status New    Target Date 12/28/20               PT Long Term Goals - 12/04/20 1916       PT LONG TERM GOAL #1   Title Patient to ambulate 1045ft with S and  LRAD    Baseline 146ft with SPC and S    Time 6    Period Weeks    Status New      PT LONG TERM GOAL #2   Title Patient to negotiate 16 steps with most appropriate pattern and support    Baseline TBD    Time 6    Period Weeks    Status New      PT LONG TERM GOAL #3   Title patient to comlete 5xSTS in 14s or less as determined by age and gender    Baseline 17.1s w/o UE assist    Time 6    Period Weeks    Status New      PT LONG TERM GOAL #4   Title Patient to demo 0.90 m/s gait velocity which would equate to age and gender norms    Baseline TBD    Time 6    Period Weeks    Status New      PT LONG TERM GOAL #5   Title Assess progress towards DGI goal    Baseline TBD    Time 6    Status New                    Patient will benefit from skilled therapeutic intervention  in order to improve the following deficits and impairments:     Visit Diagnosis: Unsteadiness on feet     Problem List Patient Active Problem List   Diagnosis Date Noted   SBO (small bowel obstruction) (HCC) 12/31/2019   Amaurosis fugax of right eye 08/25/2017   PSVT (paroxysmal supraventricular tachycardia) (HCC) 09/12/2015   Chest pain 05/03/2015   Dyspnea 05/03/2015   ALLERGIC RHINITIS 09/21/2007   G E R D 09/21/2007   SMOKE INHALATION 09/21/2007   OSTEOPOROSIS 01/28/2007   PALPITATIONS 01/28/2007   COUGH 01/28/2007   ALLERGY 01/28/2007    Kary Kos, PT 12/04/2020, 7:20 PM  [place] [facility] [address] [place], [place], [address] Phone: [phone]   Fax:  [phone]  Name: BRYSTEN REISTER MRN: [id number] Date of Birth: [birth year]

## 2020-12-06 ENCOUNTER — Telehealth: Payer: Self-pay | Admitting: Internal Medicine

## 2020-12-06 NOTE — Telephone Encounter (Signed)
Left message to call office

## 2020-12-06 NOTE — Telephone Encounter (Signed)
Pt c/o swelling: STAT is pt has developed SOB within 24 hours  How much weight have you gained and in what time span?  No weight gain  If swelling, where is the swelling located?  Left leg and foot, with redness, itching and burning  Are you currently taking a fluid pill?  No   Are you currently SOB?  No   Do you have a log of your daily weights (if so, list)?  No log available  Have you gained 3 pounds in a day or 5 pounds in a week?  No   Have you traveled recently?  No

## 2020-12-06 NOTE — Telephone Encounter (Signed)
I spoke with patient who reports he was calling to schedule his follow up appointment.  This has been scheduled for 04/16/21 with Azalee Course, PA.   Patient reports he has a little bit of swelling in his left leg but it is less than in the past. Redness has been present for several months. Itching is new in the last month or so.  I asked him to follow up with his PCP for this and to follow up with Azalee Course, PA as planned.

## 2020-12-11 ENCOUNTER — Ambulatory Visit (INDEPENDENT_AMBULATORY_CARE_PROVIDER_SITE_OTHER): Payer: Medicare Other | Admitting: Diagnostic Neuroimaging

## 2020-12-11 ENCOUNTER — Encounter: Payer: Self-pay | Admitting: Diagnostic Neuroimaging

## 2020-12-11 VITALS — BP 97/53 | HR 62 | Ht 68.5 in | Wt 124.2 lb

## 2020-12-11 DIAGNOSIS — F32A Depression, unspecified: Secondary | ICD-10-CM

## 2020-12-11 DIAGNOSIS — F419 Anxiety disorder, unspecified: Secondary | ICD-10-CM

## 2020-12-11 DIAGNOSIS — G3184 Mild cognitive impairment, so stated: Secondary | ICD-10-CM | POA: Diagnosis not present

## 2020-12-11 DIAGNOSIS — R269 Unspecified abnormalities of gait and mobility: Secondary | ICD-10-CM

## 2020-12-11 NOTE — Progress Notes (Signed)
[name]  PATIENT: Lee Nelson DOB: [birth year]  REFERRING CLINICIAN: Avva HISTORY FROM: patient  REASON FOR VISIT: follow up   HISTORICAL  CHIEF COMPLAINT:  Chief Complaint  Patient presents with   Multiple Falls    Rm 7, Internal referral for frequent falls "sitting is difficult for long periods of time, seeing ortho Friday"   MMSE 29    HISTORY OF PRESENT ILLNESS:   UPDATE (12/11/20, VRP): Since last visit, doing poorly. Continues with memory loss and balance issues. Poor sleep. More stress. Has good insight into his issues.  UPDATE (06/04/20, VRP): Since last visit, doing poorly. Continues with stress and mild memory issues and depression. Has feelings of regret towards prior financial decisions and health care for his wife.   UPDATE (05/03/19, VRP): Since last visit, doing slightly worse, more memory loss, stress, gait difficulty. No alleviating or aggravating factors. More stress related to wife's health, cargiving expenses, having to retire from Teacher, adult education.     UPDATE (06/01/18, VRP): Since last visit, noting more memory issues. Has missed a meeting. Some words are jumbled. Symptoms are progressive. Insomnia and anxiety have worsened.   UPDATE (11/09/17, VRP): Since last visit, doing about the same. Symptoms are stable. Severity is mild. No alleviating or aggravating factors. Tolerating supplements. PT exercises helped, but now recovering from hernia 10/16/17. Also had transient right eye vision changes in May 2019 (shade over the right eye) x 1 minute. No recurrence.  UPDATE (05/07/17, VRP): Since last visit, doing about the same. Tolerating meds. No alleviating or aggravating factors. Has had more falls and balance issues. More memory loss issues.   UPDATE 12/30/16: Since last visit, continues with word finding diff. Also with intermittent right sided headaches, intermittent double vision, and neck pain. Avg 1-2 days per week of headache.   UPDATE  03/10/14: Since last visit, no more double vision attacks. Notes some work finding diff. Still with stress from his wife's quadriplegia and 24 hour care at home.   PRIOR HPI (09/07/13): 80 year old right-handed male with history of neck pain, back pain, skin cancer, reflux disease, osteoporosis, anxiety, fibromyalgia, migraine, here for evaluation of headaches and transient double vision. Previous evaluated patient in 2012 for right arm numbness. Patient reports over ten-year history of intermittent neck pain. Patient has some degenerative cervical spine disease managed conservatively. Patient has one to 2 year history of intermittent headaches mainly in the right parietal and right occipital region. Some pain radiates from the neck up to the head. No nausea or vomiting. No photophobia or phonophobia. Sometimes he has sharp shooting brief pains. He rates pain severity 2-3/10. More recently headache symptoms have been more constant. No visual symptoms associated with headache. Separately patient has intermittent visual disturbance where he sees "swimming lines" in front of him, but these are not associated with headache. 10 days ago patient was at a meeting talking to some people, when he had sudden onset of vertical double vision lasting for one to 2 minutes. He had mild queasiness sensation. No vomiting. He had mild equilibrium problem without falling out of his chair. No slurred speech. No numbness or tingling. Symptoms resolved on their own. No headaches associated with this.   REVIEW OF SYSTEMS: Full 14 system review of systems performed and negative except: as per HPI.  ALLERGIES: Allergies  Allergen Reactions   Ciprofloxacin     JOINT PAIN   Flagyl [Metronidazole] Other (See Comments)    REACTION: no appetite, diarrhea after meal, decrease in  weight   Metoclopramide Hcl Other (See Comments)    REACTION: "involuntary movements"   Other Other (See Comments)    Antibiotics have unknown  reaction propophol causes memory problems   Propofol Other (See Comments)   Risperidone Other (See Comments)    "do not want"   Septra [Sulfamethoxazole-Trimethoprim] Other (See Comments)    REACTION: "involuntary movements" tripac antibiotic- heart rythm   Silodosin     ? Possibly allergy, could not breathe well out of nose   Soy Allergy Other (See Comments)    Stomach upset, "gas"    HOME MEDICATIONS: Outpatient Medications Prior to Visit  Medication Sig Dispense Refill   acetaminophen-codeine (TYLENOL #3) 300-30 MG tablet 1 po q 8 prn pain 30 tablet 0   ALPRAZolam (XANAX) 0.25 MG tablet Take 0.125 mg by mouth 2 (two) times daily as needed for anxiety or sleep.     B Complex Vitamins (VITAMIN B COMPLEX) TABS 1 tab     Cholecalciferol (VITAMIN D-3) 1000 units CAPS Take 2,000 Units by mouth daily.      Cranberry 400 MG CAPS Take 400-1,200 mg by mouth every morning.      docusate sodium (COLACE) 50 MG capsule Take 1 capsule (50 mg total) by mouth at bedtime. 10 capsule 0   L-Tyrosine 500 MG CAPS Take 500 mg by mouth daily.     MAGNESIUM CITRATE PO Take 75 mg by mouth daily.     Menaquinone-7 (VITAMIN K2 PO) Take 120-240 mg by mouth daily. Combo w/ Vit. D     NON FORMULARY Take 1 capsule by mouth daily. Acetyl L carnitine     OVER THE COUNTER MEDICATION Take 3 capsules by mouth 2 (two) times daily. Lion's mane 0.5 gram/capsule     OVER THE COUNTER MEDICATION Take 2 capsules by mouth every morning. Reparagen supplement     OVER THE COUNTER MEDICATION Melatonin patch     Phosphatidylserine 100 MG CAPS Take 100 mg by mouth every morning.      Probiotic Product (PROBIOTIC DAILY PO) Take 1 capsule by mouth daily.      Propylene Glycol (SYSTANE BALANCE) 0.6 % SOLN Apply 1 drop to eye daily as needed (dry eyes).      senna (SENOKOT) 8.6 MG tablet 2 tablets at bedtime as needed     sodium chloride (MURO 128) 5 % ophthalmic ointment Place 1 application into both eyes at bedtime.      THEANINE  PO Take 2 tablets by mouth 2 (two) times daily.      Turmeric, Curcuma Longa, (CURCUMIN) POWD Take 220 mg by mouth daily as needed (suppliment).      UNABLE TO FIND Med Name: cocovia memory     vitamin C (ASCORBIC ACID) 250 MG tablet Take 250 mg by mouth daily.     calcitonin, salmon, (MIACALCIN) 200 UNIT/ACT nasal spray Place 1 spray into alternate nostrils daily. X 10 days (Patient not taking: Reported on 11/30/2020) 3.7 mL 12   No facility-administered medications prior to visit.    PAST MEDICAL HISTORY: Past Medical History:  Diagnosis Date   Anxiety    Arthritis    Bladder calculus    BPH (benign prostatic hyperplasia)    Chronic constipation    Complication of anesthesia    " I had some coughing afterwards for a couple of days"--  per pt "perfers spinal anesthesia since general anesthesia congnitive issues when older"   Diverticulosis of colon    Dry eye syndrome of both  eyes    Environmental allergies    GERD (gastroesophageal reflux disease)    occasional   History of adenomatous polyp of colon    08/ 2004   History of kidney stones    History of squamous cell carcinoma in situ (SCCIS) of skin    s/p  excision 2013 facial areas and 06/ 2016 nose   Migraine    eye migraine occasional   Seasonal and perennial allergic rhinitis    Thrombocytopenia (HCC)    Tingling    hands and feet bilat , intermittantly-- per pt has lumbar bulging disk   Urinary frequency    Vocal fold atrophy    dysphonia-- per pt has to drink large amount of water to take even on pill   Wears glasses     PAST SURGICAL HISTORY: Past Surgical History:  Procedure Laterality Date   APPENDECTOMY  child   CARDIOVASCULAR STRESS TEST  05-17-2015  dr hilty   Low risk nuclear study w/ no ischemia/  normal LV function and wall motion , stress ef 54% (lvef 45-54%)   CHOLECYSTECTOMY N/A 09/29/2014   Procedure: LAPAROSCOPIC CHOLECYSTECTOMY WITH INTRAOPERATIVE CHOLANGIOGRAM;  Surgeon: Ovidio Kin, MD;   Location: [facility];  Service: General;  Laterality: N/A;   COLONOSCOPY  last one 09-06-2010   ESOPHAGOGASTRODUODENOSCOPY N/A 12/09/2013   Procedure: ESOPHAGOGASTRODUODENOSCOPY (EGD);  Surgeon: Willis Modena, MD;  Location[facility];  Service: Endoscopy;  Laterality: N/A;   EXTRACORPOREAL SHOCK WAVE LITHOTRIPSY  yrs ago   INGUINAL HERNIA REPAIR Left child   inguinal hernia repair  09/2017   NISSEN FUNDOPLICATION  1980's   open   STONE EXTRACTION WITH BASKET N/A 08/18/2016   Procedure: STONE EXTRACTION WITH BASKET;  Surgeon: Jethro Bolus, MD;  Location: [facility];  Service: Urology;  Laterality: N/A;   THULIUM LASER TURP (TRANSURETHRAL RESECTION OF PROSTATE) N/A 08/18/2016   Procedure: THULIUM LASER BLADDER NECK INCISION AND BLADDER STONE REMOVAL;  Surgeon: Jethro Bolus, MD;  Location: [facility];  Service: Urology;  Laterality: N/A;   TONSILLECTOMY  child   TRANSTHORACIC ECHOCARDIOGRAM  11/18/2010   grade 1 diastolic dysfunction, ef 55-60%/  trivial MR and TR/ mild dilated RA    FAMILY HISTORY: Family History  Problem Relation Age of Onset   Parkinsonism Brother    COPD Mother    Allergies Mother    Heart failure Mother    COPD Father    Stroke Father    Other Sister    Suicidality Maternal Aunt    Cancer Maternal Grandfather     SOCIAL HISTORY:  Social History   Socioeconomic History   Marital status: Married    Spouse name: Gavin Pound   Number of children: 1   Years of education: BA, MA, JD   Highest education level: Not on file  Occupational History   Occupation: teaches constitutional law   Occupation: Photographer: [employer]  Tobacco Use   Smoking status: Never   Smokeless tobacco: Never  Substance and Sexual Activity   Alcohol use: Yes    Comment: social   Drug use: No   Sexual activity: Not on file  Other Topics Concern   Not on file  Social History Narrative   Patient lives at home wife.    Daily caffeine use   Social Determinants of Health   Financial Resource Strain: Not on file  Food Insecurity: Not on file  Transportation Needs: Not on file  Physical Activity: Not  on file  Stress: Not on file  Social Connections: Not on file  Intimate Partner Violence: Not on file     PHYSICAL EXAM  GENERAL EXAM/CONSTITUTIONAL: Vitals:  Vitals:   12/11/20 0856  BP: (!) 97/53  Pulse: 62  Weight: 124 lb 3.2 oz (56.3 kg)  Height: 5' 8.5" (1.74 m)   Body mass index is 18.61 kg/m. Wt Readings from Last 3 Encounters:  12/11/20 124 lb 3.2 oz (56.3 kg)  06/04/20 120 lb 12.8 oz (54.8 kg)  02/07/20 116 lb (52.6 kg)   Patient is in no distress; well developed, nourished and groomed; neck is supple  CARDIOVASCULAR: Examination of carotid arteries is normal; no carotid bruits Regular rate and rhythm, no murmurs Examination of peripheral vascular system by observation and palpation is normal  EYES: Ophthalmoscopic exam of optic discs and posterior segments is normal; no papilledema or hemorrhages No results found.  MUSCULOSKELETAL: Gait, strength, tone, movements noted in Neurologic exam below  NEUROLOGIC: MENTAL STATUS:  MMSE - Mini Mental State Exam 12/11/2020 06/04/2020 05/03/2019  Orientation to time 5 5 5   Orientation to Place 5 5 5   Registration 3 3 3   Attention/ Calculation 4 2 5   Recall 3 3 3   Language- name 2 objects 2 2 2   Language- repeat 1 1 1   Language- follow 3 step command 3 3 3   Language- read & follow direction 1 1 1   Write a sentence 1 1 1   Copy design 1 1 1   Total score 29 27 30    awake, alert, oriented to person, place and time recent and remote memory intact normal attention and concentration language fluent, comprehension intact, naming intact fund of knowledge appropriate  CRANIAL NERVE:  2nd - no papilledema on fundoscopic exam 2nd, 3rd, 4th, 6th - pupils equal and reactive to light, visual fields full to confrontation, extraocular  muscles intact, no nystagmus 5th - facial sensation symmetric 7th - facial strength symmetric 8th - hearing intact 9th - palate elevates symmetrically, uvula midline 11th - shoulder shrug symmetric 12th - tongue protrusion midline  MOTOR:  normal bulk and tone, full strength in the BUE, BLE MILD BRADYKINESIA IN BUE / BLE  SENSORY:  normal and symmetric to light touch, temperature, vibration  COORDINATION:  finger-nose-finger, fine finger movements normal  REFLEXES:  deep tendon reflexes --> BUE 1-2; BLE 2-3; symmetric  GAIT/STATION:  narrow based gait; STOOPED POSTURE; SLIGHT UNSTEADY GAIT; USING CANE     DIAGNOSTIC DATA (LABS, IMAGING, TESTING) - I reviewed patient records, labs, notes, testing and imaging myself where available.  Lab Results  Component Value Date   WBC 5.3 01/04/2020   HGB 12.1 (L) 01/04/2020   HCT 36.7 (L) 01/04/2020   MCV 100.3 (H) 01/04/2020   PLT 120 (L) 01/04/2020      Component Value Date/Time   NA 141 01/04/2020 0327   K 3.7 01/04/2020 0327   CL 107 01/04/2020 0327   CO2 26 01/04/2020 0327   GLUCOSE 97 01/04/2020 0327   BUN 9 01/04/2020 0327   CREATININE 0.62 01/04/2020 0327   CALCIUM 8.3 (L) 01/04/2020 0327   PROT 5.4 (L) 01/01/2020 0253   ALBUMIN 2.9 (L) 01/04/2020 0327   AST 19 01/01/2020 0253   ALT 16 01/01/2020 0253   ALKPHOS 57 01/01/2020 0253   BILITOT 0.8 01/01/2020 0253   GFRNONAA >60 01/04/2020 0327   GFRAA >60 01/03/2020 0255   No results found for: CHOL No results found for: HGBA1C Lab Results  Component Value Date  NLZJQBHA19 711 05/08/2017   No results found for: TSH  07/20/10 MRI CERVICAL 1. Left C6-C7 paracentral disc protrusion abutting the exiting left C7 nerve roots appears new. Preexisting mild left C7 foraminal stenosis at this level due to facet hypertrophy.  2. Mild combined congenital and acquired spinal stenosis at the C3-C4 and C4-C5.  11/20/10 MRI brain (without contrast) demonstrating: 1.  Moderate ventriculomegaly in the temporal and occipital horns.  Likely due to subcortical atrophy. 2. No acute findings are seen.  11/20/10 MRA head (without contrast) demonstrating: - mild atheromatous irregularities in the bilateral carotid siphon regions.  11/19/10 EMG/NCS - normal  11/19/10 carotid u/s - normal  11/18/10 TTE - EF 55-60%; normal wall motion  09/19/13 MRI brain (with and without) demonstrating: 1. Moderate mesial temporal atrophy. 2. Moderate ventriculomegaly on ex vacuo basis. 3. No acute findings.  11/11/16 MRI brain [I reviewed images myself and agree with interpretation. -VRP]  1. No acute intracranial process. 2. Progressed nonspecific moderate to severe parenchymal brain volume loss for age. No hydrocephalus.  02/02/17 MRI cervical spine [I reviewed images myself and agree with interpretation. -VRP]  1. At C3-C4, there is borderline spinal stenosis due to ligamentum flavum hypertrophy and congenitally short pedicles.   There is no nerve root compression. 2. At C4-C5, there is mild spinal stenosis due to disc bulging, ligament of flavum hypertrophy, facet hypertrophy, mild uncovertebral spurring and congenitally short pedicles. There is no nerve root compression. Degenerative changes at this level have slightly progressed compared to the 07/20/2010 MRI. 3. At C5-C6, there are degenerative changes no nerve root compression or spinal stenosis 4. At C6-C7, there are degenerative changes causing mild foraminal narrowing but no nerve root compression.   On the previous MRI there was a disc protrusion to the left that is not appreciated on the current study. 5. The spinal cord has normal signal.    08/11/17 CAROTID U/S Right Carotid: There was no evidence of thrombus, dissection, atherosclerotic plaque or stenosis in the cervical carotid system.  Left Carotid: Velocities in the left ICA are consistent with a 1-39% stenosis.  Vertebrals:  Bilateral vertebral arteries  demonstrate antegrade flow.  Subclavians: Normal flow hemodynamics were seen in bilateral subclavian arteries.   01/29/18 MRI cervical spine - Mild degenerative changes in the cervical spine. Mild spinal and foraminal stenosis as above.  07/15/20 MRI lumbar spine - Negative for compression fracture. - Mild degenerative changes for age.  No neural compression.    ASSESSMENT AND PLAN  80 y.o. year old male here with chronic neck pain x 10 years, int HA x 1-2 years, with a 2 minute episode of vertical double vision in June 2015, with no recurrence.   Also with gait diff and memory loss and weight loss and gait difficulty.  Dx:  MCI (mild cognitive impairment)  Gait difficulty  Anxiety - Plan: Ambulatory referral to Psychology  Depression, unspecified depression type - Plan: Ambulatory referral to Psychology     PLAN:  MEMORY LOSS / ANXIETY / DEPRESSION - stable; continue good nutrition, exercises, brain stimulating activities - improve nutrition, exercise, mood stabilization and insomnia - refer to psychology for depression / anxiety / insomnia - consider SSRI, mirtazapine   GAIT DIFFICULTY (due to deconditioning, weight loss, cervical spinal stenosis, hip pain, arthritis) - stable; continue exercises; use cane / walker as needed - consider PT evaluation; start exercise / strength training program   TIA (right eye amaurosis fugax; May 2019) - advised to take aspirin 81mg  daily,  but caution with history of low platelets and current curcumin use   MIGRAINE WITH AURA - improved; monitor for now (did not tolerated amitriptyline)   Orders Placed This Encounter  Procedures   Ambulatory referral to Psychology   Return for return to PCP, pending if symptoms worsen or fail to improve.  I spent 48 minutes of face-to-face and non-face-to-face time with patient.  This included previsit chart review, lab review, study review, order entry, electronic health record  documentation, patient education.       Suanne Marker, MD 12/11/2020, 9:52 AM Certified in Neurology, Neurophysiology and Neuroimaging  [facility] [address] [place], [place] [address] [phone]

## 2020-12-11 NOTE — Patient Instructions (Addendum)
  MEMORY LOSS / ANXIETY / DEPRESSION - stable; continue good nutrition, exercises, brain stimulating activities - improve nutrition, exercise, mood stabilization and insomnia - refer to psychology for depression / anxiety / insomnia - consider SSRI, mirtazapine   GAIT DIFFICULTY (due to deconditioning, weight loss, cervical spinal stenosis, hip pain, arthritis) - stable; continue exercises; use cane / walker as needed - consider PT evaluation; start exercise / strength training program

## 2020-12-12 ENCOUNTER — Telehealth: Payer: Self-pay | Admitting: Diagnostic Neuroimaging

## 2020-12-12 NOTE — Telephone Encounter (Signed)
Pt would like a call to discuss a referral to a therapist re: talking and stress.  Pt states he will elaborate when called back

## 2020-12-12 NOTE — Telephone Encounter (Signed)
I called the pt. He reports he has though about the psychiatric recommendation and would like to put this on hold at this time.  Reports he has talked with therapist before over the years and not much benefit was noted.  Sts he previously had seen a meditation/relaxation group through [facility] and is going to call about getting set up with them again.  I will advised pt care team to hold referral at the this time for psych.

## 2020-12-13 ENCOUNTER — Ambulatory Visit: Payer: Medicare Other | Admitting: Physical Therapy

## 2020-12-13 NOTE — Telephone Encounter (Signed)
Noted  

## 2020-12-14 ENCOUNTER — Ambulatory Visit (INDEPENDENT_AMBULATORY_CARE_PROVIDER_SITE_OTHER): Payer: Medicare Other | Admitting: Orthopedic Surgery

## 2020-12-14 ENCOUNTER — Other Ambulatory Visit: Payer: Self-pay

## 2020-12-14 ENCOUNTER — Ambulatory Visit: Payer: Medicare Other

## 2020-12-14 DIAGNOSIS — M25551 Pain in right hip: Secondary | ICD-10-CM | POA: Diagnosis not present

## 2020-12-14 DIAGNOSIS — M25552 Pain in left hip: Secondary | ICD-10-CM

## 2020-12-14 DIAGNOSIS — R296 Repeated falls: Secondary | ICD-10-CM | POA: Diagnosis not present

## 2020-12-15 ENCOUNTER — Encounter: Payer: Self-pay | Admitting: Orthopedic Surgery

## 2020-12-15 NOTE — Progress Notes (Signed)
Office Visit Note   Patient: Lee Nelson           Date of Birth: Mar 22, 1941           MRN: [id number] Visit Date: 12/14/2020 Requested by: Chilton Greathouse, MD [address] [place],  [place] [address] PCP: Chilton Greathouse, MD  Subjective: Chief Complaint  Patient presents with   Lower Back - Pain   Right Hip - Pain    HPI: Lee Nelson is a 80 y.o. male who returns to the office for follow-up visit.  Plan from last visit was to refer patient to neuro physical therapy to work on gait training and balance.  Patient now returns with some improvement compared with prior appointment.  Reports he has been faithfully doing his exercises but has only really been in with physical therapy for the last several weeks.  He does feel like he has noticed a difference but there are several aggravating exercises that elicit buttocks pain or groin pain based on the exercises that he is doing.  He cannot recall the specific ones except for one exercise that appears to be strictly hip flexion while he is standing; this causes groin pain.  He is concerned about his buttocks pain which is pretty much constant if he is sitting or laying down and only gets relief from standing but even this causes pain sometimes.  He has groin pain primarily in the right groin with not much left-sided symptoms.  He has had several falls in the last several months since his previous MRI scan of the pelvis on 08/29/2020.  Not taking much in the way of medication for pain control or anti-inflammatories, instead opting for dietary supplements and diet adjustments to control inflammation.                ROS: All systems reviewed are negative as they relate to the chief complaint within the history of present illness.  Patient denies fevers or chills.  Assessment & Plan: Visit Diagnoses:  1. Pain in right hip   2. Falls frequently   3. Bilateral hip pain     Plan: Lee Nelson is a 80 y.o. male who returns to the  office for follow-up visit for pelvic pain.  Mostly localizes pain to the right groin and bilateral buttocks..  Plan from last visit was physical therapy for gait training and balance.  They now return with some improvement but he does complain of aggravating exercises.  He canceled his CT scan as he was afraid of the radiation.  Last MRI of the pelvis from 08/29/2020 revealed nondisplaced bilateral sacral alla fractures involving S3 and S4.  Also revealed mild osteoarthritis of the bilateral hips worse in the right hip.  Feels that the physical therapy is helping in the last couple weeks but he is only just started this.  Some of the exercises are aggravating, particularly for his groin pain.  Suspect that groin pain is related to the hip arthritis he has which is worse in the right hip.  Offered injection into the right hip but patient is somewhat apprehensive about this and would like to think about this further and not proceed with injection at this time.  With multiple falls since last MRI scan and history of persistent bony edema surrounding the nondisplaced sacral fractures, he would like to ensure there is no further fracture or pathology to explain his persistent symptoms.  Ordered MRI of the pelvis for further evaluation.  Previous radiographs  have been negative for fracture.  Follow-Up Instructions: No follow-ups on file.   Orders:  Orders Placed This Encounter  Procedures   MR Pelvis w/o contrast   No orders of the defined types were placed in this encounter.     Procedures: No procedures performed   Clinical Data: No additional findings.  Objective: Vital Signs: There were no vitals taken for this visit.  Physical Exam:  Constitutional: Patient appears well-developed HEENT:  Head: Normocephalic Eyes:EOM are normal Neck: Normal range of motion Cardiovascular: Normal rate Pulmonary/chest: Effort normal Neurologic: Patient is alert Skin: Skin is warm Psychiatric: Patient has  normal mood and affect  Ortho Exam: Ortho exam demonstrates negative straight leg raise bilaterally.  Groin pain on the right elicited with terminal hip flexion and internal rotation of the hip joint.  No significant left groin pain elicited on exam.  Positive Stinchfield sign with groin pain elicited as well as buttocks pain.  No tenderness over the bilateral trochanters.  Patient is able to weight-bear and ambulate with some antalgia.  He has minimal tenderness over the ischial tuberosities bilaterally.  No tenderness over the coccyx or sacrum.  Mild tenderness throughout the axial lumbar and thoracic spines with very prominent spinous processes.  Specialty Comments:  No specialty comments available.  Imaging: No results found.   PMFS History: Patient Active Problem List   Diagnosis Date Noted   SBO (small bowel obstruction) (HCC) 12/31/2019   Amaurosis fugax of right eye 08/25/2017   PSVT (paroxysmal supraventricular tachycardia) (HCC) 09/12/2015   Chest pain 05/03/2015   Dyspnea 05/03/2015   ALLERGIC RHINITIS 09/21/2007   G E R D 09/21/2007   SMOKE INHALATION 09/21/2007   OSTEOPOROSIS 01/28/2007   PALPITATIONS 01/28/2007   COUGH 01/28/2007   ALLERGY 01/28/2007   Past Medical History:  Diagnosis Date   Anxiety    Arthritis    Bladder calculus    BPH (benign prostatic hyperplasia)    Chronic constipation    Complication of anesthesia    " I had some coughing afterwards for a couple of days"--  per pt "perfers spinal anesthesia since general anesthesia congnitive issues when older"   Diverticulosis of colon    Dry eye syndrome of both eyes    Environmental allergies    GERD (gastroesophageal reflux disease)    occasional   History of adenomatous polyp of colon    08/ 2004   History of kidney stones    History of squamous cell carcinoma in situ (SCCIS) of skin    s/p  excision 2013 facial areas and 06/ 2016 nose   Migraine    eye migraine occasional   Seasonal and  perennial allergic rhinitis    Thrombocytopenia (HCC)    Tingling    hands and feet bilat , intermittantly-- per pt has lumbar bulging disk   Urinary frequency    Vocal fold atrophy    dysphonia-- per pt has to drink large amount of water to take even on pill   Wears glasses     Family History  Problem Relation Age of Onset   Parkinsonism Brother    COPD Mother    Allergies Mother    Heart failure Mother    COPD Father    Stroke Father    Other Sister    Suicidality Maternal Aunt    Cancer Maternal Grandfather     Past Surgical History:  Procedure Laterality Date   APPENDECTOMY  child   CARDIOVASCULAR STRESS TEST  05-17-2015  dr Rennis Golden   Low risk nuclear study w/ no ischemia/  normal LV function and wall motion , stress ef 54% (lvef 45-54%)   CHOLECYSTECTOMY N/A 09/29/2014   Procedure: LAPAROSCOPIC CHOLECYSTECTOMY WITH INTRAOPERATIVE CHOLANGIOGRAM;  Surgeon: Ovidio Kin, MD;  Location: [facility];  Service: General;  Laterality: N/A;   COLONOSCOPY  last one 09-06-2010   ESOPHAGOGASTRODUODENOSCOPY N/A 12/09/2013   Procedure: ESOPHAGOGASTRODUODENOSCOPY (EGD);  Surgeon: Willis Modena, MD;  Location: [facility];  Service: Endoscopy;  Laterality: N/A;   EXTRACORPOREAL SHOCK WAVE LITHOTRIPSY  yrs ago   INGUINAL HERNIA REPAIR Left child   inguinal hernia repair  09/2017   NISSEN FUNDOPLICATION  1980's   open   STONE EXTRACTION WITH BASKET N/A 08/18/2016   Procedure: STONE EXTRACTION WITH BASKET;  Surgeon: Jethro Bolus, MD;  Location: [facility];  Service: Urology;  Laterality: N/A;   THULIUM LASER TURP (TRANSURETHRAL RESECTION OF PROSTATE) N/A 08/18/2016   Procedure: THULIUM LASER BLADDER NECK INCISION AND BLADDER STONE REMOVAL;  Surgeon: Jethro Bolus, MD;  Location: [facility];  Service: Urology;  Laterality: N/A;   TONSILLECTOMY  child   TRANSTHORACIC ECHOCARDIOGRAM  11/18/2010   grade 1 diastolic dysfunction, ef 55-60%/  trivial MR and TR/  mild dilated RA   Social History   Occupational History   Occupation: teaches constitutional law   Occupation: Photographer: [employer]  Tobacco Use   Smoking status: Never   Smokeless tobacco: Never  Substance and Sexual Activity   Alcohol use: Yes    Comment: social   Drug use: No   Sexual activity: Not on file

## 2020-12-17 ENCOUNTER — Other Ambulatory Visit: Payer: Self-pay

## 2020-12-17 ENCOUNTER — Ambulatory Visit: Payer: Medicare Other

## 2020-12-17 DIAGNOSIS — R296 Repeated falls: Secondary | ICD-10-CM

## 2020-12-17 DIAGNOSIS — R2689 Other abnormalities of gait and mobility: Secondary | ICD-10-CM

## 2020-12-17 DIAGNOSIS — R2681 Unsteadiness on feet: Secondary | ICD-10-CM

## 2020-12-17 NOTE — Therapy (Signed)
[facility] [address] [place], [place], [address] Phone: [phone]   Fax:  [phone]  Physical Therapy Treatment  Patient Details  Name: Lee Nelson MRN: [id number] Date of Birth: 19-May-1940 Referring Provider (PT): [name] PA   Encounter Date: 12/17/2020   PT End of Session - 12/17/20 1301     Visit Number 3    Number of Visits 13    Date for PT Re-Evaluation 01/18/21    Authorization Type MCR/MCD    Progress Note Due on Visit 10    PT Start Time 1145    PT Stop Time 1230    PT Time Calculation (min) 45 min    Equipment Utilized During Treatment Gait belt    Activity Tolerance Patient tolerated treatment well    Behavior During Therapy WFL for tasks assessed/performed             Past Medical History:  Diagnosis Date   Anxiety    Arthritis    Bladder calculus    BPH (benign prostatic hyperplasia)    Chronic constipation    Complication of anesthesia    " I had some coughing afterwards for a couple of days"--  per pt "perfers spinal anesthesia since general anesthesia congnitive issues when older"   Diverticulosis of colon    Dry eye syndrome of both eyes    Environmental allergies    GERD (gastroesophageal reflux disease)    occasional   History of adenomatous polyp of colon    08/ 2004   History of kidney stones    History of squamous cell carcinoma in situ (SCCIS) of skin    s/p  excision 2013 facial areas and 06/ 2016 nose   Migraine    eye migraine occasional   Seasonal and perennial allergic rhinitis    Thrombocytopenia (HCC)    Tingling    hands and feet bilat , intermittantly-- per pt has lumbar bulging disk   Urinary frequency    Vocal fold atrophy    dysphonia-- per pt has to drink large amount of water to take even on pill   Wears glasses     Past Surgical History:  Procedure Laterality Date   APPENDECTOMY  child   CARDIOVASCULAR STRESS TEST  05-17-2015  dr  hilty   Low risk nuclear study w/ no ischemia/  normal LV function and wall motion , stress ef 54% (lvef 45-54%)   CHOLECYSTECTOMY N/A 09/29/2014   Procedure: LAPAROSCOPIC CHOLECYSTECTOMY WITH INTRAOPERATIVE CHOLANGIOGRAM;  Surgeon: Ovidio Kin, MD;  Location: [facility];  Service: General;  Laterality: N/A;   COLONOSCOPY  last one 09-06-2010   ESOPHAGOGASTRODUODENOSCOPY N/A 12/09/2013   Procedure: ESOPHAGOGASTRODUODENOSCOPY (EGD);  Surgeon: Willis Modena, MD;  Location: [facility]OSCOPY;  Service: Endoscopy;  Laterality: N/A;   EXTRACORPOREAL SHOCK WAVE LITHOTRIPSY  yrs ago   INGUINAL HERNIA REPAIR Left child   inguinal hernia repair  09/2017   NISSEN FUNDOPLICATION  1980's   open   STONE EXTRACTION WITH BASKET N/A 08/18/2016   Procedure: STONE EXTRACTION WITH BASKET;  Surgeon: Jethro Bolus, MD;  Location: [facility];  Service: Urology;  Laterality: N/A;   THULIUM LASER TURP (TRANSURETHRAL RESECTION OF PROSTATE) N/A 08/18/2016   Procedure: THULIUM LASER BLADDER NECK INCISION AND BLADDER STONE REMOVAL;  Surgeon: Jethro Bolus, MD;  Location: [facility];  Service: Urology;  Laterality: N/A;   TONSILLECTOMY  child   TRANSTHORACIC ECHOCARDIOGRAM  11/18/2010   grade 1 diastolic dysfunction, ef  55-60%/  trivial MR and TR/ mild dilated RA    There were no vitals filed for this visit.   Subjective Assessment - 12/17/20 1258     Subjective Some HEP tasks were aggravating symptoms, asked for those to be removed, will have an MRI scheduled to determine underlying hip pathology    Pertinent History Patient is a 80 year old male who presents with recent history of multiple falls.  He has had a recent fall about a month ago and buttock and low back pain since that fall.  He has not received his platform walker yet.  Today's radiographs are negative for any identifiable fracture.  However, concerned with patient's increasing instability and troubles with balance.  Only  takes 1 fall for a potentially disastrous injury.  With this in mind, recommended physical therapy routine to optimize his balance and strength.  He is also had good experience at [facility] neurologic with Dr. Geraldine Contras.  Plan to refer him to [facility] neuro for evaluation of worsening balance and multiple falls.  In the meantime, recommended he start working with PT and obtain a platform walker.  Follow-up in 6 weeks for clinical recheck with Dr. August Saucer to ensure that his buttock and hip pain are progressively improving or resolved.    Limitations Standing;Walking    How long can you sit comfortably? >30 min    How long can you stand comfortably? <30 min    How long can you walk comfortably? <30 min    Patient Stated Goals to improve my balance    Pain Onset 1 to 4 weeks ago                               [facility] Adult PT Treatment/Exercise - 12/17/20 0001       Transfers   Transfers Sit to Stand;Stand to Sit    Sit to Stand 5: Supervision    Stand to Sit 5: Supervision      Ambulation/Gait   Ambulation/Gait Yes    Ambulation/Gait Assistance 6: Modified independent (Device/Increase time)    Ambulation Distance (Feet) 100 Feet    Assistive device None    Gait Pattern Trunk flexed;Right flexed knee in stance;Left flexed knee in stance    Ambulation Surface Level;Indoor    Gait velocity 0.34 m/s      Lumbar Exercises: Supine   Bridge 10 reps    Bridge Limitations 2x5    Other Supine Lumbar Exercises supine marching, alt 10reps ea. LE      Lumbar Exercises: Sidelying   Clam Both;10 reps    Clam Limitations painful to lie on R side      Manual Therapy   Manual Therapy Soft tissue mobilization    Manual therapy comments symptoms diminished post mob    Soft tissue mobilization TP release to R piriformis in l sidelie x8'                       PT Short Term Goals - 12/17/20 1415       PT SHORT TERM GOAL #1   Title Pt will initiate HEP in order to  indicate decreased fall risk and improved functional mobility.    Baseline 5x STS; 12/17/20 [id number]    Time 3    Period Weeks    Status Achieved    Target Date 12/28/20      PT SHORT TERM GOAL #2   Title Assess  DGI and establish goal    Baseline TBD    Time 3    Period Weeks    Status New    Target Date 12/28/20      PT SHORT TERM GOAL #3   Title Assess gait velocity and set appropriate goal; Goal is 0.6 m/s    Baseline TBD; 12/17/20 Gait velocity 0.34 m/s    Time 3    Period Weeks    Status New    Target Date 12/28/20      PT SHORT TERM GOAL #4   Title Pt will ambulate up to 500' at independent level with improved posture using LRAD    Baseline 115ft in clinic with cane    Time 3    Period Weeks    Status New    Target Date 12/28/20               PT Long Term Goals - 12/17/20 1423       PT LONG TERM GOAL #1   Title Patient to ambulate 1031ft with S and LRAD    Baseline 128ft with SPC and S    Time 6    Period Weeks    Status New      PT LONG TERM GOAL #2   Title Patient to negotiate 16 steps with most appropriate pattern and support    Baseline TBD    Time 6    Period Weeks    Status New      PT LONG TERM GOAL #3   Title patient to comlete 5xSTS in 14s or less as determined by age and gender    Baseline 17.1s w/o UE assist    Time 6    Period Weeks    Status New      PT LONG TERM GOAL #4   Title Patient to demo 0.60 m/s gait velocity which would equate to age and gender norms    Baseline TBD; 12/17/20 0.34 m/s    Time 6    Period Weeks    Status New      PT LONG TERM GOAL #5   Title Assess progress towards DGI goal    Baseline TBD    Time 6    Status New                   Plan - 12/17/20 1301     Clinical Impression Statement Todays session was spent reviewing current HEP and modifying to eliminate any aggravating tasks.  All exercises performed in supine and sidelie position which was tolerated well.  Palpation found TP in R  pririformis and generated refrred groin pain.  Added trigger point release.  Measured gait velocity and assessed stair negotiation.    Personal Factors and Comorbidities Age;Past/Current Experience    Examination-Activity Limitations Transfers;Stairs;Locomotion Level    Examination-Participation Restrictions Yard Work    Stability/Clinical Decision Making Stable/Uncomplicated    Rehab Potential Good    PT Frequency 2x / week    PT Duration 6 weeks    PT Treatment/Interventions ADLs/Self Care Home Management;Aquatic Therapy;DME Instruction;Gait training;Stair training;Functional mobility training;Therapeutic activities;Therapeutic exercise;Balance training;Neuromuscular re-education;Patient/family education    PT Next Visit Plan DGI and stairs; check HEP for any ?'s or problems, R piriformis tenderness    PT Home Exercise Plan 5x STS; Med[employer] [id number] (unable to get text to copy into note)    Consulted and Agree with Plan of Care Patient  Patient will benefit from skilled therapeutic intervention in order to improve the following deficits and impairments:  Abnormal gait, Difficulty walking, Decreased endurance, Decreased activity tolerance, Decreased balance, Decreased mobility, Decreased strength  Visit Diagnosis: Unsteadiness on feet  Balance disorder  Repeated falls     Problem List Patient Active Problem List   Diagnosis Date Noted   SBO (small bowel obstruction) (HCC) 12/31/2019   Amaurosis fugax of right eye 08/25/2017   PSVT (paroxysmal supraventricular tachycardia) (HCC) 09/12/2015   Chest pain 05/03/2015   Dyspnea 05/03/2015   ALLERGIC RHINITIS 09/21/2007   G E R D 09/21/2007   SMOKE INHALATION 09/21/2007   OSTEOPOROSIS 01/28/2007   PALPITATIONS 01/28/2007   COUGH 01/28/2007   ALLERGY 01/28/2007    Hildred Laser, PT 12/17/2020, 2:26 PM  [facility] [address] [place], [place],  [address] Phone: [phone]   Fax:  [phone]  Name: Lee Nelson MRN: [id number] Date of Birth: 27-May-1940

## 2020-12-17 NOTE — Patient Instructions (Signed)
Access Code: [email or web address] URL: [email or web address] Date: 12/17/2020 Prepared by: Gustavus Bryant  Exercises Sit to Stand with Arms Crossed - 1 x daily - 7 x weekly - 1 sets - 5-10 reps Supine Bridge - 2 x daily - 7 x weekly - 2 sets - 10 reps Supine March - 2 x daily - 7 x weekly - 2 sets - 10 reps Clamshell - 2 x daily - 7 x weekly - 2 sets - 10 reps

## 2020-12-19 ENCOUNTER — Ambulatory Visit: Payer: Medicare Other

## 2020-12-19 ENCOUNTER — Other Ambulatory Visit: Payer: Self-pay

## 2020-12-19 DIAGNOSIS — R2689 Other abnormalities of gait and mobility: Secondary | ICD-10-CM | POA: Diagnosis not present

## 2020-12-19 DIAGNOSIS — R296 Repeated falls: Secondary | ICD-10-CM

## 2020-12-19 DIAGNOSIS — R2681 Unsteadiness on feet: Secondary | ICD-10-CM

## 2020-12-19 NOTE — Therapy (Signed)
[facility] [address] [place], [place], [address] Phone: [phone]   Fax:  [phone]  Physical Therapy Treatment  Patient Details  Name: Lee Nelson MRN: [id number] Date of Birth: 14-Oct-1940 Referring Provider (PT): [name] PA   Encounter Date: 12/19/2020   PT End of Session - 12/19/20 1238     Visit Number 4    Number of Visits 13    Date for PT Re-Evaluation 01/18/21    Authorization Type MCR/MCD    Progress Note Due on Visit 10    PT Start Time 1240    PT Stop Time 1315    PT Time Calculation (min) 35 min    Equipment Utilized During Treatment Gait belt    Activity Tolerance Patient tolerated treatment well    Behavior During Therapy WFL for tasks assessed/performed             Past Medical History:  Diagnosis Date   Anxiety    Arthritis    Bladder calculus    BPH (benign prostatic hyperplasia)    Chronic constipation    Complication of anesthesia    " I had some coughing afterwards for a couple of days"--  per pt "perfers spinal anesthesia since general anesthesia congnitive issues when older"   Diverticulosis of colon    Dry eye syndrome of both eyes    Environmental allergies    GERD (gastroesophageal reflux disease)    occasional   History of adenomatous polyp of colon    08/ 2004   History of kidney stones    History of squamous cell carcinoma in situ (SCCIS) of skin    s/p  excision 2013 facial areas and 06/ 2016 nose   Migraine    eye migraine occasional   Seasonal and perennial allergic rhinitis    Thrombocytopenia (HCC)    Tingling    hands and feet bilat , intermittantly-- per pt has lumbar bulging disk   Urinary frequency    Vocal fold atrophy    dysphonia-- per pt has to drink large amount of water to take even on pill   Wears glasses     Past Surgical History:  Procedure Laterality Date   APPENDECTOMY  child   CARDIOVASCULAR STRESS TEST  05-17-2015  dr  hilty   Low risk nuclear study w/ no ischemia/  normal LV function and wall motion , stress ef 54% (lvef 45-54%)   CHOLECYSTECTOMY N/A 09/29/2014   Procedure: LAPAROSCOPIC CHOLECYSTECTOMY WITH INTRAOPERATIVE CHOLANGIOGRAM;  Surgeon: Ovidio Kin, MD;  Location: [facility];  Service: General;  Laterality: N/A;   COLONOSCOPY  last one 09-06-2010   ESOPHAGOGASTRODUODENOSCOPY N/A 12/09/2013   Procedure: ESOPHAGOGASTRODUODENOSCOPY (EGD);  Surgeon: Willis Modena, MD;  Location: [facility];  Service: Endoscopy;  Laterality: N/A;   EXTRACORPOREAL SHOCK WAVE LITHOTRIPSY  yrs ago   INGUINAL HERNIA REPAIR Left child   inguinal hernia repair  09/2017   NISSEN FUNDOPLICATION  1980's   open   STONE EXTRACTION WITH BASKET N/A 08/18/2016   Procedure: STONE EXTRACTION WITH BASKET;  Surgeon: Jethro Bolus, MD;  Location: [facility];  Service: Urology;  Laterality: N/A;   THULIUM LASER TURP (TRANSURETHRAL RESECTION OF PROSTATE) N/A 08/18/2016   Procedure: THULIUM LASER BLADDER NECK INCISION AND BLADDER STONE REMOVAL;  Surgeon: Jethro Bolus, MD;  Location: [facility];  Service: Urology;  Laterality: N/A;   TONSILLECTOMY  child   TRANSTHORACIC ECHOCARDIOGRAM  11/18/2010   grade 1 diastolic dysfunction, ef  55-60%/  trivial MR and TR/ mild dilated RA    There were no vitals filed for this visit.   Subjective Assessment - 12/19/20 1241     Subjective HEP more easily tolerated, still notes occaisional groin twinges    Pertinent History Patient is a 80 year old male who presents with recent history of multiple falls.  He has had a recent fall about a month ago and buttock and low back pain since that fall.  He has not received his platform walker yet.  Today's radiographs are negative for any identifiable fracture.  However, concerned with patient's increasing instability and troubles with balance.  Only takes 1 fall for a potentially disastrous injury.  With this in mind,  recommended physical therapy routine to optimize his balance and strength.  He is also had good experience at [facility] neurologic with Dr. Geraldine Contras.  Plan to refer him to [facility] neuro for evaluation of worsening balance and multiple falls.  In the meantime, recommended he start working with PT and obtain a platform walker.  Follow-up in 6 weeks for clinical recheck with Dr. August Saucer to ensure that his buttock and hip pain are progressively improving or resolved.    Limitations Standing;Walking    How long can you sit comfortably? >30 min    How long can you stand comfortably? <30 min    How long can you walk comfortably? <30 min    Patient Stated Goals to improve my balance    Pain Onset 1 to 4 weeks ago                [facility] PT Assessment - 12/19/20 0001       Dynamic Gait Index   Level Surface Normal    Change in Gait Speed Mild Impairment    Gait with Horizontal Head Turns Normal    Gait with Vertical Head Turns Normal    Gait and Pivot Turn Normal    Step Over Obstacle Normal    Step Around Obstacles Normal    Steps Normal    Total Score 23                           [facility] Adult PT Treatment/Exercise - 12/19/20 0001       Transfers   Transfers Sit to Stand;Stand to Sit    Sit to Stand 5: Supervision    Stand to Sit 5: Supervision      Ambulation/Gait   Ambulation/Gait Yes    Ambulation/Gait Assistance 6: Modified independent (Device/Increase time)    Ambulation Distance (Feet) 150 Feet    Assistive device None    Gait Pattern Trunk flexed;Right flexed knee in stance;Left flexed knee in stance    Ambulation Surface Level;Indoor      Lumbar Exercises: Sidelying   Clam 10 reps    Clam Limitations no pain reported    Other Sidelying Lumbar Exercises isolated glute medius abduction      Knee/Hip Exercises: Stretches   Hip Flexor Stretch Right;Limitations    Hip Flexor Stretch Limitations 2' static hold    Piriformis Stretch Right;Limitations     Piriformis Stretch Limitations 2 min static hold      Manual Therapy   Manual Therapy Soft tissue mobilization    Soft tissue mobilization TP release to R piriformis in L sidelie x10'                       PT Short  Term Goals - 12/19/20 1425       PT SHORT TERM GOAL #1   Title Pt will initiate HEP in order to indicate decreased fall risk and improved functional mobility.    Baseline 5x STS; 12/17/20 [id number]    Time 3    Period Weeks    Status Achieved    Target Date 12/28/20      PT SHORT TERM GOAL #2   Title Assess DGI and establish goal    Baseline TBD; 12/19/20 DGI score 23/24    Time 3    Period Weeks    Status Achieved    Target Date 12/28/20      PT SHORT TERM GOAL #3   Title Assess gait velocity and set appropriate goal; Goal is 0.6 m/s    Baseline TBD; 12/17/20 Gait velocity 0.34 m/s    Time 3    Period Weeks    Status New    Target Date 12/28/20      PT SHORT TERM GOAL #4   Title Pt will ambulate up to 500' at independent level with improved posture using LRAD    Baseline 155ft in clinic with cane    Time 3    Period Weeks    Status New    Target Date 12/28/20               PT Long Term Goals - 12/19/20 1425       PT LONG TERM GOAL #1   Title Patient to ambulate 1047ft with S and LRAD    Baseline 157ft with SPC and S    Time 6    Period Weeks    Status New      PT LONG TERM GOAL #2   Title Patient to negotiate 16 steps with most appropriate pattern and support    Baseline TBD    Time 6    Period Weeks    Status New      PT LONG TERM GOAL #3   Title patient to comlete 5xSTS in 14s or less as determined by age and gender    Baseline 17.1s w/o UE assist    Time 6    Period Weeks    Status New      PT LONG TERM GOAL #4   Title Patient to demo 0.60 m/s gait velocity which would equate to age and gender norms    Baseline TBD; 12/17/20 0.34 m/s    Time 6    Period Weeks    Status New      PT LONG TERM GOAL #5   Title Assess  progress towards DGI goal    Baseline TBD; 12/19/20 23/24 score, no goal needed    Time 6    Status Achieved                   Plan - 12/19/20 1422     Clinical Impression Statement todays session focused on causes of R hip pain and balance issues.  Patient scores 23/24/on DGI but demos marked tightness in R psoas/hip flexors, trigger point in R piriformis and glute medius weakness.  Balance appears to be affected by R hip weakness    Personal Factors and Comorbidities Age;Past/Current Experience    Examination-Activity Limitations Transfers;Stairs;Locomotion Level    Examination-Participation Restrictions Yard Work    Stability/Clinical Decision Making Stable/Uncomplicated    Rehab Potential Good    PT Frequency 2x / week    PT Duration 6 weeks  PT Treatment/Interventions ADLs/Self Care Home Management;Aquatic Therapy;DME Instruction;Gait training;Stair training;Functional mobility training;Therapeutic activities;Therapeutic exercise;Balance training;Neuromuscular re-education;Patient/family education    PT Next Visit Plan R piriformis release, hip flexor mobility, glute medius strengthening    PT Home Exercise Plan 5x STS; [employer] [id number] (unable to get text to copy into note)    Consulted and Agree with Plan of Care Patient             Patient will benefit from skilled therapeutic intervention in order to improve the following deficits and impairments:  Abnormal gait, Difficulty walking, Decreased endurance, Decreased activity tolerance, Decreased balance, Decreased mobility, Decreased strength  Visit Diagnosis: Unsteadiness on feet  Balance disorder  Repeated falls     Problem List Patient Active Problem List   Diagnosis Date Noted   SBO (small bowel obstruction) (HCC) 12/31/2019   Amaurosis fugax of right eye 08/25/2017   PSVT (paroxysmal supraventricular tachycardia) (HCC) 09/12/2015   Chest pain 05/03/2015   Dyspnea 05/03/2015   ALLERGIC RHINITIS  09/21/2007   G E R D 09/21/2007   SMOKE INHALATION 09/21/2007   OSTEOPOROSIS 01/28/2007   PALPITATIONS 01/28/2007   COUGH 01/28/2007   ALLERGY 01/28/2007    Hildred Laser, PT 12/19/2020, 2:31 PM  [facility] [address] [place], [place], [address] Phone: [phone]   Fax:  [phone]  Name: Lee Nelson MRN: [id number] Date of Birth: [birth year]

## 2020-12-24 ENCOUNTER — Ambulatory Visit: Payer: Medicare Other

## 2020-12-24 ENCOUNTER — Other Ambulatory Visit: Payer: Self-pay

## 2020-12-24 DIAGNOSIS — R2689 Other abnormalities of gait and mobility: Secondary | ICD-10-CM

## 2020-12-24 DIAGNOSIS — R2681 Unsteadiness on feet: Secondary | ICD-10-CM

## 2020-12-24 DIAGNOSIS — R296 Repeated falls: Secondary | ICD-10-CM

## 2020-12-24 NOTE — Therapy (Signed)
[facility] [address] [place], [place], [address] Phone: [phone]   Fax:  [phone]  Physical Therapy Treatment  Patient Details  Name: Lee Nelson MRN: [id number] Date of Birth: 08-Nov-1940 Referring Provider (PT): [name] PA   Encounter Date: 12/24/2020   PT End of Session - 12/24/20 1355     Visit Number 5    Number of Visits 13    Date for PT Re-Evaluation 01/18/21    Authorization Type MCR/MCD    Progress Note Due on Visit 10    PT Start Time 1320    PT Stop Time 1400    PT Time Calculation (min) 40 min    Equipment Utilized During Treatment Gait belt    Activity Tolerance Patient tolerated treatment well    Behavior During Therapy WFL for tasks assessed/performed             Past Medical History:  Diagnosis Date   Anxiety    Arthritis    Bladder calculus    BPH (benign prostatic hyperplasia)    Chronic constipation    Complication of anesthesia    " I had some coughing afterwards for a couple of days"--  per pt "perfers spinal anesthesia since general anesthesia congnitive issues when older"   Diverticulosis of colon    Dry eye syndrome of both eyes    Environmental allergies    GERD (gastroesophageal reflux disease)    occasional   History of adenomatous polyp of colon    08/ 2004   History of kidney stones    History of squamous cell carcinoma in situ (SCCIS) of skin    s/p  excision 2013 facial areas and 06/ 2016 nose   Migraine    eye migraine occasional   Seasonal and perennial allergic rhinitis    Thrombocytopenia (HCC)    Tingling    hands and feet bilat , intermittantly-- per pt has lumbar bulging disk   Urinary frequency    Vocal fold atrophy    dysphonia-- per pt has to drink large amount of water to take even on pill   Wears glasses     Past Surgical History:  Procedure Laterality Date   APPENDECTOMY  child   CARDIOVASCULAR STRESS TEST  05-17-2015  dr  hilty   Low risk nuclear study w/ no ischemia/  normal LV function and wall motion , stress ef 54% (lvef 45-54%)   CHOLECYSTECTOMY N/A 09/29/2014   Procedure: LAPAROSCOPIC CHOLECYSTECTOMY WITH INTRAOPERATIVE CHOLANGIOGRAM;  Surgeon: Ovidio Kin, MD;  Location: [facility];  Service: General;  Laterality: N/A;   COLONOSCOPY  last one 09-06-2010   ESOPHAGOGASTRODUODENOSCOPY N/A 12/09/2013   Procedure: ESOPHAGOGASTRODUODENOSCOPY (EGD);  Surgeon: Willis Modena, MD;  Location: [facility];  Service: Endoscopy;  Laterality: N/A;   EXTRACORPOREAL SHOCK WAVE LITHOTRIPSY  yrs ago   INGUINAL HERNIA REPAIR Left child   inguinal hernia repair  09/2017   NISSEN FUNDOPLICATION  1980's   open   STONE EXTRACTION WITH BASKET N/A 08/18/2016   Procedure: STONE EXTRACTION WITH BASKET;  Surgeon: Jethro Bolus, MD;  Location: [facility];  Service: Urology;  Laterality: N/A;   THULIUM LASER TURP (TRANSURETHRAL RESECTION OF PROSTATE) N/A 08/18/2016   Procedure: THULIUM LASER BLADDER NECK INCISION AND BLADDER STONE REMOVAL;  Surgeon: Jethro Bolus, MD;  Location: [facility];  Service: Urology;  Laterality: N/A;   TONSILLECTOMY  child   TRANSTHORACIC ECHOCARDIOGRAM  11/18/2010   grade 1 diastolic dysfunction, ef  55-60%/  trivial MR and TR/ mild dilated RA    There were no vitals filed for this visit.   Subjective Assessment - 12/24/20 1318     Subjective Contiues with R hip and groin pain.  MRI scheduled for 9/30.  Unsure if last session was beneficial regarding soft tissue work.    Pertinent History Patient is a 80 year old male who presents with recent history of multiple falls.  He has had a recent fall about a month ago and buttock and low back pain since that fall.  He has not received his platform walker yet.  Today's radiographs are negative for any identifiable fracture.  However, concerned with patient's increasing instability and troubles with balance.  Only takes 1  fall for a potentially disastrous injury.  With this in mind, recommended physical therapy routine to optimize his balance and strength.  He is also had good experience at [facility] neurologic with Dr. Geraldine Contras.  Plan to refer him to [facility] neuro for evaluation of worsening balance and multiple falls.  In the meantime, recommended he start working with PT and obtain a platform walker.  Follow-up in 6 weeks for clinical recheck with Dr. August Saucer to ensure that his buttock and hip pain are progressively improving or resolved.    Limitations Standing;Walking    How long can you sit comfortably? >30 min    How long can you stand comfortably? <30 min    How long can you walk comfortably? <30 min    Patient Stated Goals to improve my balance    Pain Onset 1 to 4 weeks ago                               [facility] Adult PT Treatment/Exercise - 12/24/20 0001       Transfers   Transfers Sit to Stand;Stand to Sit    Sit to Stand 5: Supervision    Stand to Sit 5: Supervision      Ambulation/Gait   Ambulation/Gait Yes    Ambulation/Gait Assistance 6: Modified independent (Device/Increase time)    Ambulation Distance (Feet) 100 Feet    Assistive device None    Gait Pattern Trunk flexed;Right flexed knee in stance;Left flexed knee in stance    Ambulation Surface Level;Indoor      Lumbar Exercises: Supine   Bridge 10 reps    Bridge Limitations 1x10    Other Supine Lumbar Exercises supine marching, alt 10reps ea. LE      Lumbar Exercises: Sidelying   Clam 10 reps    Clam Limitations mild manual resistance    Other Sidelying Lumbar Exercises isolated glute medius abduction      Knee/Hip Exercises: Stretches   Hip Flexor Stretch Right;Limitations    Hip Flexor Stretch Limitations 2' static hold    Piriformis Stretch Right;Limitations    Piriformis Stretch Limitations 2 min static hold      Manual Therapy   Manual Therapy Soft tissue mobilization    Soft tissue mobilization TP  release to R piriformis in L sidelie x10'                       PT Short Term Goals - 12/19/20 1425       PT SHORT TERM GOAL #1   Title Pt will initiate HEP in order to indicate decreased fall risk and improved functional mobility.    Baseline 5x STS; 12/17/20 [id number]  Time 3    Period Weeks    Status Achieved    Target Date 12/28/20      PT SHORT TERM GOAL #2   Title Assess DGI and establish goal    Baseline TBD; 12/19/20 DGI score 23/24    Time 3    Period Weeks    Status Achieved    Target Date 12/28/20      PT SHORT TERM GOAL #3   Title Assess gait velocity and set appropriate goal; Goal is 0.6 m/s    Baseline TBD; 12/17/20 Gait velocity 0.34 m/s    Time 3    Period Weeks    Status New    Target Date 12/28/20      PT SHORT TERM GOAL #4   Title Pt will ambulate up to 500' at independent level with improved posture using LRAD    Baseline 167ft in clinic with cane    Time 3    Period Weeks    Status New    Target Date 12/28/20               PT Long Term Goals - 12/19/20 1425       PT LONG TERM GOAL #1   Title Patient to ambulate 1048ft with S and LRAD    Baseline 128ft with SPC and S    Time 6    Period Weeks    Status New      PT LONG TERM GOAL #2   Title Patient to negotiate 16 steps with most appropriate pattern and support    Baseline TBD    Time 6    Period Weeks    Status New      PT LONG TERM GOAL #3   Title patient to comlete 5xSTS in 14s or less as determined by age and gender    Baseline 17.1s w/o UE assist    Time 6    Period Weeks    Status New      PT LONG TERM GOAL #4   Title Patient to demo 0.60 m/s gait velocity which would equate to age and gender norms    Baseline TBD; 12/17/20 0.34 m/s    Time 6    Period Weeks    Status New      PT LONG TERM GOAL #5   Title Assess progress towards DGI goal    Baseline TBD; 12/19/20 23/24 score, no goal needed    Time 6    Status Achieved                    Plan - 12/24/20 1413     Clinical Impression Statement Todays session continued with soft tissue mobilization to R piriformis muscle in the form of trigger point release.  Continued isolated strengthening to address R hip abduction weakness in gluteus medius as well as stretching to hip flexors and R hip joint.  Symptoms reproduced with stretching and strengthening tasks suggesting a soft tissue component as well as hip joint tightness and restrictions to capsule.    Personal Factors and Comorbidities Age;Past/Current Experience    Examination-Activity Limitations Transfers;Stairs;Locomotion Level    Examination-Participation Restrictions Yard Work    Stability/Clinical Decision Making Stable/Uncomplicated    Rehab Potential Good    PT Frequency 2x / week    PT Duration 6 weeks    PT Treatment/Interventions ADLs/Self Care Home Management;Aquatic Therapy;DME Instruction;Gait training;Stair training;Functional mobility training;Therapeutic activities;Therapeutic exercise;Balance training;Neuromuscular re-education;Patient/family education    PT Next  Visit Plan R piriformis release, hip flexor mobility, glute medius strengthening, aerobic training STG check    PT Home Exercise Plan 5x STS; [id number]    Consulted and Agree with Plan of Care Patient             Patient will benefit from skilled therapeutic intervention in order to improve the following deficits and impairments:  Abnormal gait, Difficulty walking, Decreased endurance, Decreased activity tolerance, Decreased balance, Decreased mobility, Decreased strength  Visit Diagnosis: Unsteadiness on feet  Balance disorder  Repeated falls     Problem List Patient Active Problem List   Diagnosis Date Noted   SBO (small bowel obstruction) (HCC) 12/31/2019   Amaurosis fugax of right eye 08/25/2017   PSVT (paroxysmal supraventricular tachycardia) (HCC) 09/12/2015   Chest pain 05/03/2015   Dyspnea 05/03/2015   ALLERGIC RHINITIS  09/21/2007   G E R D 09/21/2007   SMOKE INHALATION 09/21/2007   OSTEOPOROSIS 01/28/2007   PALPITATIONS 01/28/2007   COUGH 01/28/2007   ALLERGY 01/28/2007    Hildred Laser, PT 12/24/2020, 2:25 PM  [facility] [address] [place], [place], [address] Phone: [phone]   Fax:  [phone]  Name: Lee Nelson MRN: [id number] Date of Birth: [birth year]

## 2020-12-25 ENCOUNTER — Encounter (HOSPITAL_BASED_OUTPATIENT_CLINIC_OR_DEPARTMENT_OTHER): Payer: Self-pay | Admitting: Internal Medicine

## 2020-12-25 ENCOUNTER — Telehealth: Payer: Self-pay | Admitting: Orthopedic Surgery

## 2020-12-25 ENCOUNTER — Ambulatory Visit (INDEPENDENT_AMBULATORY_CARE_PROVIDER_SITE_OTHER): Payer: Medicare Other | Admitting: Internal Medicine

## 2020-12-25 VITALS — BP 110/58 | HR 51 | Ht 67.0 in | Wt 125.0 lb

## 2020-12-25 DIAGNOSIS — I471 Supraventricular tachycardia, unspecified: Secondary | ICD-10-CM

## 2020-12-25 DIAGNOSIS — I739 Peripheral vascular disease, unspecified: Secondary | ICD-10-CM

## 2020-12-25 DIAGNOSIS — R490 Dysphonia: Secondary | ICD-10-CM

## 2020-12-25 DIAGNOSIS — R634 Abnormal weight loss: Secondary | ICD-10-CM

## 2020-12-25 NOTE — Progress Notes (Signed)
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 Karsten Fells with [facility] and vascular center until 2010. In the past he said complaints of chest pain and shortness of breath but underwent workup including stress test and echocardiogram which was negative. He also had some lower extremity leg pain concerning for claudication but had normal arterial Dopplers. Recently he's had some left chest pain and shortness of breath. He also feels some discomfort from time to time and almost on a daily basis some fluttering or what described as palpitations. He saw his primary care provider for this who referred him back to our office. He does not have a lot of traditional cardiac risk factors. He denies hypertension, diabetes or significant family history of coronary disease. He reports he generally been thin most of his life and has a low blood pressure and low heart rate at rest. He was referred for a lexiscan stress test and monitor. The stress test was negative for ischemia with an EF of 54%. He wore the 48 hour monitor which demonstrated an episode of PSVT. This was less than 10 seconds however could've made him symptomatic. This could explain why been feeling some palpitations. It did not appear to be atrial fibrillation or atrial flutter.  09/12/2015  [name] returns today for follow-up. He's been taking his low-dose of Toprol but reports some occasional dizziness and fatigue with exertion. He has run a low normal blood pressure and I was concerned about it possibly getting lower with the beta blocker. He does note that his ectopy has improved on the beta blocker, but the trade off side effects do not seem to be worth it.  08/25/2017  [name] is seen today in follow-up.  Overall he is doing well.  He came off of the Toprol because of  low blood pressure and he did not quite feel as well on it.  He reports some palpitations intermittently but generally is not bothered by them.  He recently had an episode of amaurosis fugax.  He had a work-up through Dr. Marjory Lies at [facility] neurologic and was thought to ultimately have migraine with aura.  He also had an ophthalmologic evaluation at [facility].  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  02/07/2020  [name] is seen today for routine follow-up.  He was seen by Azalee Course, PA-C over the summer and was having issues with leg pain.  He underwent arterial Dopplers which were negative for obstruction.  More recently he was hospitalized at [facility] for small bowel obstruction.  He said prior abdominal surgeries and it was felt that adhesions may be playing a role in it.  He is also had significant weight loss, today at 116 pounds with a BMI of 18.  He says that at home at times he can weigh as low as 105 pounds.  He does report trying to eat and gain weight but is losing weight unintentionally.  He does not have an upcoming follow-up with his PCP and is said it has been difficult to get in there.  Blood pressure today was low at 102/50.  He denies chest pain or shortness of breath.  He has had some leg swelling and is noted to have varicose veins bilaterally.  He also mentions some issues with possible memory loss and had questions about blood flow to his brain.  12/25/2020  [name] is seen today in follow-up.  Overall he seems to be doing fairly well.  His weight fortunately has stabilized somewhat.  Back in March it was down to 120 pounds and now he is weighing at 125 pounds.  He is still on the low end of normal weight.  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  He is also primarily caring for his wife.  He has not had follow-up with his PCP in the past year.  Last lipids showed LDL cholesterol of 64.  He denies chest pain or worsening shortness of breath.  He has no palpitations.  EKG shows a  sinus bradycardia at 51.  He is unaware whether his heart rate increases with exercise or not.  PMHx:  Past Medical History:  Diagnosis Date   Anxiety    Arthritis    Bladder calculus    BPH (benign prostatic hyperplasia)    Chronic constipation    Complication of anesthesia    " I had some coughing afterwards for a couple of days"--  per pt "perfers spinal anesthesia since general anesthesia congnitive issues when older"   Diverticulosis of colon    Dry eye syndrome of both eyes    Environmental allergies    GERD (gastroesophageal reflux disease)    occasional   History of adenomatous polyp of colon    08/ 2004   History of kidney stones    History of squamous cell carcinoma in situ (SCCIS) of skin    s/p  excision 2013 facial areas and 06/ 2016 nose   Migraine    eye migraine occasional   Seasonal and perennial allergic rhinitis    Thrombocytopenia (HCC)    Tingling    hands and feet bilat , intermittantly-- per pt has lumbar bulging disk   Urinary frequency    Vocal fold atrophy    dysphonia-- per pt has to drink large amount of water to take even on pill   Wears glasses     Past Surgical History:  Procedure Laterality Date   APPENDECTOMY  child   CARDIOVASCULAR STRESS TEST  05-17-2015  dr Charlotte Brafford   Low risk nuclear study w/ no ischemia/  normal LV function and wall motion , stress ef 54% (lvef 45-54%)   CHOLECYSTECTOMY N/A 09/29/2014   Procedure: LAPAROSCOPIC CHOLECYSTECTOMY WITH INTRAOPERATIVE CHOLANGIOGRAM;  Surgeon: Ovidio Kin, MD;  Location: [facility];  Service: General;  Laterality: N/A;   COLONOSCOPY  last one 09-06-2010   ESOPHAGOGASTRODUODENOSCOPY N/A 12/09/2013   Procedure: ESOPHAGOGASTRODUODENOSCOPY (EGD);  Surgeon: Willis Modena, MD;  Location: [facility] END[facility];  Service: Endoscopy;  Laterality: N/A;   EXTRACORPOREAL SHOCK WAVE LITHOTRIPSY  yrs ago   INGUINAL HERNIA REPAIR Left child   inguinal hernia repair  09/2017   NISSEN FUNDOPLICATION  1980's   open    STONE EXTRACTION WITH BASKET N/A 08/18/2016   Procedure: STONE EXTRACTION WITH BASKET;  Surgeon: Jethro Bolus, MD;  Location[facility];  Service: Urology;  Laterality: N/A;   THULIUM LASER TURP (TRANSURETHRAL RESECTION OF PROSTATE) N/A 08/18/2016   Procedure: THULIUM LASER BLADDER NECK INCISION AND BLADDER STONE REMOVAL;  Surgeon: Jethro Bolus, MD;  Location: [facility];  Service: Urology;  Laterality: N/A;   TONSILLECTOMY  child   TRANSTHORACIC ECHOCARDIOGRAM  11/18/2010   grade 1 diastolic dysfunction, ef 55-60%/  trivial MR and TR/ mild dilated RA    FAMHx:  Family History  Problem Relation Age of Onset   Parkinsonism  Brother    COPD Mother    Allergies Mother    Heart failure Mother    COPD Father    Stroke Father    Other Sister    Suicidality Maternal Aunt    Cancer Maternal Grandfather     SOCHx:   reports that he has never smoked. He has never used smokeless tobacco. He reports current alcohol use. He reports that he does not use drugs.  ALLERGIES:  Allergies  Allergen Reactions   Ciprofloxacin     JOINT PAIN   Flagyl [Metronidazole] Other (See Comments)    REACTION: no appetite, diarrhea after meal, decrease in weight   Metoclopramide Hcl Other (See Comments)    REACTION: "involuntary movements"   Other Other (See Comments)    Antibiotics have unknown reaction propophol causes memory problems   Propofol Other (See Comments)   Risperidone Other (See Comments)    "do not want"   Septra [Sulfamethoxazole-Trimethoprim] Other (See Comments)    REACTION: "involuntary movements" tripac antibiotic- heart rythm   Silodosin     ? Possibly allergy, could not breathe well out of nose   Soy Allergy Other (See Comments)    Stomach upset, "gas"    ROS: Pertinent items noted in HPI and remainder of comprehensive ROS otherwise negative.  HOME MEDS: Current Outpatient Medications  Medication Sig Dispense Refill    acetaminophen-codeine (TYLENOL #3) 300-30 MG tablet 1 po q 8 prn pain 30 tablet 0   Acetylcysteine (NAC) 500 MG CAPS Take by mouth.     B Complex Vitamins (VITAMIN B COMPLEX) TABS 1 tab     Cholecalciferol (VITAMIN D-3) 1000 units CAPS Take 2,000 Units by mouth daily.      Cranberry 400 MG CAPS Take 400-1,200 mg by mouth every morning.      docusate sodium (COLACE) 50 MG capsule Take 1 capsule (50 mg total) by mouth at bedtime. 10 capsule 0   MAGNESIUM CITRATE PO Take 75 mg by mouth daily.     Menaquinone-7 (VITAMIN K2 PO) Take 120-240 mg by mouth daily. Combo w/ Vit. D     NON FORMULARY Take 1 capsule by mouth daily. Acetyl L carnitine     OVER THE COUNTER MEDICATION Take 3 capsules by mouth 2 (two) times daily. Lion's mane 0.5 gram/capsule     OVER THE COUNTER MEDICATION Take 2 capsules by mouth every morning. Reparagen supplement     OVER THE COUNTER MEDICATION Melatonin patch     Phosphatidylserine 100 MG CAPS Take 100 mg by mouth every morning.      Probiotic Product (PROBIOTIC DAILY PO) Take 1 capsule by mouth daily.      Propylene Glycol (SYSTANE BALANCE) 0.6 % SOLN Apply 1 drop to eye daily as needed (dry eyes).      sodium chloride (MURO 128) 5 % ophthalmic ointment Place 1 application into both eyes at bedtime.      sodium chloride (MURO 128) 5 % ophthalmic solution See admin instructions.     tadalafil (CIALIS) 5 MG tablet Take by mouth.     THEANINE PO Take 2 tablets by mouth 2 (two) times daily.      THIAMINE HCL PO Take by mouth.     UNABLE TO FIND Med Name: cocovia memory     vitamin C (ASCORBIC ACID) 250 MG tablet Take 250 mg by mouth daily.     No current facility-administered medications for this visit.    LABS/IMAGING: No results found for this or any  previous visit (from the past 48 hour(s)). No results found.  WEIGHTS: Wt Readings from Last 3 Encounters:  12/25/20 125 lb (56.7 kg)  12/11/20 124 lb 3.2 oz (56.3 kg)  06/04/20 120 lb 12.8 oz (54.8 kg)     VITALS: BP (!) 110/58   Pulse (!) 51   Ht 5\' 7"  (1.702 m)   Wt 125 lb (56.7 kg)   SpO2 98%   BMI 19.58 kg/m   EXAM: General appearance: alert and no distress Neck: no carotid bruit, no JVD and thyroid not enlarged, symmetric, no tenderness/mass/nodules Lungs: clear to auscultation bilaterally Heart: regular rate and rhythm Abdomen: soft, non-tender; bowel sounds normal; no masses,  no organomegaly Extremities: extremities normal, atraumatic, no cyanosis or edema Pulses: 2+ and symmetric Skin: Skin color, texture, turgor normal. No rashes or lesions Neurologic: Grossly normal Psych: Pleasant  EKG: Sinus bradycardia at 51-personally reviewed  ASSESSMENT: Bilateral leg pain-suspect varicose veins, normal lower extremity arterial Dopplers History of PSVT Unintentional weight loss Recent small bowel obstruction Memory loss Vocal hoarseness  PLAN: 1.   Mr. Salvetti continues to do fairly well.  He has gained a little bit of weight back but at least is not losing it.  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  He denies any leg pain at this point and is not having any palpitations or tachycardia.  Heart rate is low.  He was advised to monitor that to make sure that it increases with exercise.  He has a pulse oximeter at home.  Finally, he has reported some hoarseness of his voice that is been present for several weeks.  He was inquiring about a referral to an ENT.  I have recommended Dr. Lyda Jester and will place a referral for that.  Follow-up with me annually or sooner as necessary.  Suszanne Conners, MD, [facility], FACP  [place]  [facility]  Medical Director of the Advanced Lipid Disorders &  Cardiovascular Risk Reduction Clinic Diplomate of the American Board of Clinical Lipidology Attending Cardiologist  Direct Dial: [phone]  Fax: [phone]  Website:  [email or web address] Tristan Proto 12/25/2020, 2:10 PM

## 2020-12-25 NOTE — Patient Instructions (Signed)
Medication Instructions:  Your physician recommends that you continue on your current medications as directed. Please refer to the Current Medication list given to you today.  *If you need a refill on your cardiac medications before your next appointment, please call your pharmacy*   Follow-Up: At [facility], you and your health needs are our priority.  As part of our continuing mission to provide you with exceptional heart care, we have created designated Provider Care Teams.  These Care Teams include your primary Cardiologist (physician) and Advanced Practice Providers (APPs -  Physician Assistants and Nurse Practitioners) who all work together to provide you with the care you need, when you need it.  We recommend signing up for the patient portal called "MyChart".  Sign up information is provided on this After Visit Summary.  MyChart is used to connect with patients for Virtual Visits (Telemedicine).  Patients are able to view lab/test results, encounter notes, upcoming appointments, etc.  Non-urgent messages can be sent to your provider as well.   To learn more about what you can do with MyChart, go to [email or web address].    Your next appointment:   12 month(s)  The format for your next appointment:   In Person  Provider:   You may see Chrystie Nose, MD or one of the following Advanced Practice Providers on your designated Care Team:   Azalee Course, PA-C Micah Flesher, PA-C or  Judy Pimple, [place]   Other Instructions You have been referred to Dr. Suszanne Conners (ENT)

## 2020-12-25 NOTE — Telephone Encounter (Signed)
I can call after scan and say for sure pls send back with scan results thx on 9/31

## 2020-12-25 NOTE — Telephone Encounter (Signed)
Pt asking

## 2020-12-25 NOTE — Telephone Encounter (Signed)
Called pt to sch his MRI follow up with Dr.Dean and that appt is sch for 01/07/21. Pt did have a follow up question if Dr. August Saucer thinks it is okay for him to go to his neuro P.T appt that same day or the days prior or if pt should wait until getting his results back from Dr. August Saucer. The best call back number is [phone] or [phone].

## 2020-12-26 NOTE — Telephone Encounter (Signed)
Holding until scan obtained.  

## 2020-12-27 ENCOUNTER — Other Ambulatory Visit: Payer: Self-pay

## 2020-12-27 ENCOUNTER — Ambulatory Visit: Payer: Medicare Other

## 2020-12-27 DIAGNOSIS — R296 Repeated falls: Secondary | ICD-10-CM

## 2020-12-27 DIAGNOSIS — R2681 Unsteadiness on feet: Secondary | ICD-10-CM

## 2020-12-27 DIAGNOSIS — R2689 Other abnormalities of gait and mobility: Secondary | ICD-10-CM

## 2020-12-27 NOTE — Therapy (Signed)
[facility] [address] [place], [place], [address] Phone: [phone]   Fax:  [phone]  Physical Therapy Treatment  Patient Details  Name: Lee Nelson MRN: [id number] Date of Birth: 08-28-1940 Referring Provider (PT): [name] PA   Encounter Date: 12/27/2020   PT End of Session - 12/27/20 1441     Visit Number 6    Number of Visits 13    Date for PT Re-Evaluation 01/18/21    Authorization Type MCR/MCD    Authorization Time Period 9/2-11/2    Progress Note Due on Visit 10    PT Start Time 1230    PT Stop Time 1315    PT Time Calculation (min) 45 min    Equipment Utilized During Treatment Gait belt    Activity Tolerance Patient tolerated treatment well    Behavior During Therapy [facility] for tasks assessed/performed             Past Medical History:  Diagnosis Date   Anxiety    Arthritis    Bladder calculus    BPH (benign prostatic hyperplasia)    Chronic constipation    Complication of anesthesia    " I had some coughing afterwards for a couple of days"--  per pt "perfers spinal anesthesia since general anesthesia congnitive issues when older"   Diverticulosis of colon    Dry eye syndrome of both eyes    Environmental allergies    GERD (gastroesophageal reflux disease)    occasional   History of adenomatous polyp of colon    08/ 2004   History of kidney stones    History of squamous cell carcinoma in situ (SCCIS) of skin    s/p  excision 2013 facial areas and 06/ 2016 nose   Migraine    eye migraine occasional   Seasonal and perennial allergic rhinitis    Thrombocytopenia (HCC)    Tingling    hands and feet bilat , intermittantly-- per pt has lumbar bulging disk   Urinary frequency    Vocal fold atrophy    dysphonia-- per pt has to drink large amount of water to take even on pill   Wears glasses     Past Surgical History:  Procedure Laterality Date   APPENDECTOMY  child    CARDIOVASCULAR STRESS TEST  05-17-2015  dr hilty   Low risk nuclear study w/ no ischemia/  normal LV function and wall motion , stress ef 54% (lvef 45-54%)   CHOLECYSTECTOMY N/A 09/29/2014   Procedure: LAPAROSCOPIC CHOLECYSTECTOMY WITH INTRAOPERATIVE CHOLANGIOGRAM;  Surgeon: Ovidio Kin, MD;  Location: [facility];  Service: General;  Laterality: N/A;   COLONOSCOPY  last one 09-06-2010   ESOPHAGOGASTRODUODENOSCOPY N/A 12/09/2013   Procedure: ESOPHAGOGASTRODUODENOSCOPY (EGD);  Surgeon: Willis Modena, MD;  Location: [facility] ENDOSCOPY;  Service: Endoscopy;  Laterality: N/A;   EXTRACORPOREAL SHOCK WAVE LITHOTRIPSY  yrs ago   INGUINAL HERNIA REPAIR Left child   inguinal hernia repair  09/2017   NISSEN FUNDOPLICATION  1980's   open   STONE EXTRACTION WITH BASKET N/A 08/18/2016   Procedure: STONE EXTRACTION WITH BASKET;  Surgeon: Jethro Bolus, MD;  Location: [facility];  Service: Urology;  Laterality: N/A;   THULIUM LASER TURP (TRANSURETHRAL RESECTION OF PROSTATE) N/A 08/18/2016   Procedure: THULIUM LASER BLADDER NECK INCISION AND BLADDER STONE REMOVAL;  Surgeon: Jethro Bolus, MD;  Location: [facility];  Service: Urology;  Laterality: N/A;   TONSILLECTOMY  child   TRANSTHORACIC ECHOCARDIOGRAM  11/18/2010  grade 1 diastolic dysfunction, ef 55-60%/  trivial MR and TR/ mild dilated RA    There were no vitals filed for this visit.   Subjective Assessment - 12/27/20 1233     Subjective Felt comfortable enough to walk several times lasting 20 minutes at longest time    Pertinent History Patient is a 80 year old male who presents with recent history of multiple falls.  He has had a recent fall about a month ago and buttock and low back pain since that fall.  He has not received his platform walker yet.  Today's radiographs are negative for any identifiable fracture.  However, concerned with patient's increasing instability and troubles with balance.  Only takes 1 fall  for a potentially disastrous injury.  With this in mind, recommended physical therapy routine to optimize his balance and strength.  He is also had good experience at [facility] neurologic with Dr. Geraldine Contras.  Plan to refer him to [facility] neuro for evaluation of worsening balance and multiple falls.  In the meantime, recommended he start working with PT and obtain a platform walker.  Follow-up in 6 weeks for clinical recheck with Dr. August Saucer to ensure that his buttock and hip pain are progressively improving or resolved.    Limitations Standing;Walking    How long can you sit comfortably? >30 min    How long can you stand comfortably? <30 min    How long can you walk comfortably? <30 min    Patient Stated Goals to improve my balance    Pain Onset 1 to 4 weeks ago                               [facility] Adult PT Treatment/Exercise - 12/27/20 0001       Transfers   Transfers Sit to Stand;Stand to Sit    Sit to Stand 4: Min guard    Stand to Sit 4: Min guard    Comments ball b/t knees      Ambulation/Gait   Ambulation/Gait Yes    Ambulation/Gait Assistance 6: Modified independent (Device/Increase time);5: Supervision    Ambulation Distance (Feet) 230 Feet    Assistive device None    Gait Pattern Trunk flexed;Right flexed knee in stance;Left flexed knee in stance    Ambulation Surface Level;Indoor      Lumbar Exercises: Supine   Bridge 10 reps    Bridge Limitations 1x10    Other Supine Lumbar Exercises L shouder flexion combined with R quad sets to facilitate R psoas stertching, 10x      Lumbar Exercises: Sidelying   Clam Right;10 reps;Other (comment)    Clam Limitations 3x10 against with manual facilitation    Other Sidelying Lumbar Exercises isolated glute medius abduction      Knee/Hip Exercises: Seated   Long Arc Quad Strengthening;Both;2 sets;15 reps    Long Arc Quad Limitations with ball squeeze      Manual Therapy   Manual Therapy Soft tissue mobilization     Soft tissue mobilization TP release to R piriformis in L sidelie x10'                       PT Short Term Goals - 12/19/20 1425       PT SHORT TERM GOAL #1   Title Pt will initiate HEP in order to indicate decreased fall risk and improved functional mobility.    Baseline 5x STS; 12/17/20 [id number]  Time 3    Period Weeks    Status Achieved    Target Date 12/28/20      PT SHORT TERM GOAL #2   Title Assess DGI and establish goal    Baseline TBD; 12/19/20 DGI score 23/24    Time 3    Period Weeks    Status Achieved    Target Date 12/28/20      PT SHORT TERM GOAL #3   Title Assess gait velocity and set appropriate goal; Goal is 0.6 m/s    Baseline TBD; 12/17/20 Gait velocity 0.34 m/s    Time 3    Period Weeks    Status New    Target Date 12/28/20      PT SHORT TERM GOAL #4   Title Pt will ambulate up to 500' at independent level with improved posture using LRAD    Baseline 157ft in clinic with cane    Time 3    Period Weeks    Status New    Target Date 12/28/20               PT Long Term Goals - 12/19/20 1425       PT LONG TERM GOAL #1   Title Patient to ambulate 1092ft with S and LRAD    Baseline 173ft with SPC and S    Time 6    Period Weeks    Status New      PT LONG TERM GOAL #2   Title Patient to negotiate 16 steps with most appropriate pattern and support    Baseline TBD    Time 6    Period Weeks    Status New      PT LONG TERM GOAL #3   Title patient to comlete 5xSTS in 14s or less as determined by age and gender    Baseline 17.1s w/o UE assist    Time 6    Period Weeks    Status New      PT LONG TERM GOAL #4   Title Patient to demo 0.60 m/s gait velocity which would equate to age and gender norms    Baseline TBD; 12/17/20 0.34 m/s    Time 6    Period Weeks    Status New      PT LONG TERM GOAL #5   Title Assess progress towards DGI goal    Baseline TBD; 12/19/20 23/24 score, no goal needed    Time 6    Status Achieved                    Plan - 12/27/20 1443     Clinical Impression Statement Continued with STM to R piriformis muscle with less tenderness reported as well as less palpable soft tissu dysfunction.  Increased reps on manually resisted gluteus medius exercises.  OP added to hp flexor stretch.  Introduced L shoulder flexion combined with R quad setting to faciltate stertch to R psoas.  Added ball b/t knees during LAQs and STS transfers to reduce adduction moment and place R hip into neutral.  Patient demonstrating an improved, more upright posture with increased knee extension noted during stance time    Personal Factors and Comorbidities Age;Past/Current Experience    Examination-Activity Limitations Transfers;Stairs;Locomotion Level    Examination-Participation Restrictions Yard Work    Stability/Clinical Decision Making Stable/Uncomplicated    Rehab Potential Good    PT Frequency 2x / week    PT Duration 6 weeks  PT Treatment/Interventions ADLs/Self Care Home Management;Aquatic Therapy;DME Instruction;Gait training;Stair training;Functional mobility training;Therapeutic activities;Therapeutic exercise;Balance training;Neuromuscular re-education;Patient/family education    PT Next Visit Plan R piriformis release, hip flexor mobility, glute medius strengthening, aerobic training STG check, resolve adduction moment at R hip to reduce pain    PT Home Exercise Plan 5x STS; [id number]    Consulted and Agree with Plan of Care Patient             Patient will benefit from skilled therapeutic intervention in order to improve the following deficits and impairments:  Abnormal gait, Difficulty walking, Decreased endurance, Decreased activity tolerance, Decreased balance, Decreased mobility, Decreased strength  Visit Diagnosis: Unsteadiness on feet  Balance disorder  Repeated falls     Problem List Patient Active Problem List   Diagnosis Date Noted   SBO (small bowel obstruction) (HCC)  12/31/2019   Amaurosis fugax of right eye 08/25/2017   PSVT (paroxysmal supraventricular tachycardia) (HCC) 09/12/2015   Chest pain 05/03/2015   Dyspnea 05/03/2015   ALLERGIC RHINITIS 09/21/2007   G E R D 09/21/2007   SMOKE INHALATION 09/21/2007   OSTEOPOROSIS 01/28/2007   PALPITATIONS 01/28/2007   COUGH 01/28/2007   ALLERGY 01/28/2007    Hildred Laser, PT 12/27/2020, 2:53 PM  [facility] [address] [place], [place], [address] Phone: [phone]   Fax:  [phone]  Name: Lee Nelson MRN: [id number] Date of Birth: [birth year]

## 2020-12-28 ENCOUNTER — Ambulatory Visit
Admission: RE | Admit: 2020-12-28 | Discharge: 2020-12-28 | Disposition: A | Discharge status: Home / Self Care | Payer: Medicare Other | Source: Ambulatory Visit | Attending: Surgical | Admitting: Surgical

## 2020-12-28 DIAGNOSIS — M25552 Pain in left hip: Secondary | ICD-10-CM

## 2020-12-28 DIAGNOSIS — M25551 Pain in right hip: Secondary | ICD-10-CM

## 2020-12-28 DIAGNOSIS — R296 Repeated falls: Secondary | ICD-10-CM

## 2020-12-31 ENCOUNTER — Ambulatory Visit: Payer: Medicare Other | Attending: Surgical

## 2020-12-31 ENCOUNTER — Other Ambulatory Visit: Payer: Self-pay

## 2020-12-31 DIAGNOSIS — R296 Repeated falls: Secondary | ICD-10-CM

## 2020-12-31 DIAGNOSIS — R2681 Unsteadiness on feet: Secondary | ICD-10-CM | POA: Diagnosis not present

## 2020-12-31 DIAGNOSIS — R2689 Other abnormalities of gait and mobility: Secondary | ICD-10-CM | POA: Diagnosis present

## 2020-12-31 NOTE — Therapy (Signed)
[facility] [address] [place], [place], [address] Phone: [phone]   Fax:  [phone]  Physical Therapy Treatment  Patient Details  Name: Lee Nelson MRN: [id number] Date of Birth: Nov 14, 1940 Referring Provider (PT): [name] PA   Encounter Date: 12/31/2020   PT End of Session - 12/31/20 1143     Visit Number 7    Number of Visits 13    Date for PT Re-Evaluation 01/18/21    Authorization Type MCR/MCD    Authorization Time Period 9/2-11/2    Progress Note Due on Visit 10    PT Start Time 1145    PT Stop Time 1230    PT Time Calculation (min) 45 min    Equipment Utilized During Treatment Gait belt    Activity Tolerance Patient tolerated treatment well    Behavior During Therapy [facility] for tasks assessed/performed             Past Medical History:  Diagnosis Date   Anxiety    Arthritis    Bladder calculus    BPH (benign prostatic hyperplasia)    Chronic constipation    Complication of anesthesia    " I had some coughing afterwards for a couple of days"--  per pt "perfers spinal anesthesia since general anesthesia congnitive issues when older"   Diverticulosis of colon    Dry eye syndrome of both eyes    Environmental allergies    GERD (gastroesophageal reflux disease)    occasional   History of adenomatous polyp of colon    08/ 2004   History of kidney stones    History of squamous cell carcinoma in situ (SCCIS) of skin    s/p  excision 2013 facial areas and 06/ 2016 nose   Migraine    eye migraine occasional   Seasonal and perennial allergic rhinitis    Thrombocytopenia (HCC)    Tingling    hands and feet bilat , intermittantly-- per pt has lumbar bulging disk   Urinary frequency    Vocal fold atrophy    dysphonia-- per pt has to drink large amount of water to take even on pill   Wears glasses     Past Surgical History:  Procedure Laterality Date   APPENDECTOMY  child    CARDIOVASCULAR STRESS TEST  05-17-2015  dr hilty   Low risk nuclear study w/ no ischemia/  normal LV function and wall motion , stress ef 54% (lvef 45-54%)   CHOLECYSTECTOMY N/A 09/29/2014   Procedure: LAPAROSCOPIC CHOLECYSTECTOMY WITH INTRAOPERATIVE CHOLANGIOGRAM;  Surgeon: Ovidio Kin, MD;  Location: [facility];  Service: General;  Laterality: N/A;   COLONOSCOPY  last one 09-06-2010   ESOPHAGOGASTRODUODENOSCOPY N/A 12/09/2013   Procedure: ESOPHAGOGASTRODUODENOSCOPY (EGD);  Surgeon: Willis Modena, MD;  Location: [facility] ENDOSCOPY;  Service: Endoscopy;  Laterality: N/A;   EXTRACORPOREAL SHOCK WAVE LITHOTRIPSY  yrs ago   INGUINAL HERNIA REPAIR Left child   inguinal hernia repair  09/2017   NISSEN FUNDOPLICATION  1980's   open   STONE EXTRACTION WITH BASKET N/A 08/18/2016   Procedure: STONE EXTRACTION WITH BASKET;  Surgeon: Jethro Bolus, MD;  Location: [facility];  Service: Urology;  Laterality: N/A;   THULIUM LASER TURP (TRANSURETHRAL RESECTION OF PROSTATE) N/A 08/18/2016   Procedure: THULIUM LASER BLADDER NECK INCISION AND BLADDER STONE REMOVAL;  Surgeon: Jethro Bolus, MD;  Location: [facility];  Service: Urology;  Laterality: N/A;   TONSILLECTOMY  child   TRANSTHORACIC ECHOCARDIOGRAM  11/18/2010  grade 1 diastolic dysfunction, ef 55-60%/  trivial MR and TR/ mild dilated RA    There were no vitals filed for this visit.   Subjective Assessment - 12/31/20 1147     Subjective Spent the weekend having to take more care of his wife.  Reporting elevated discomfort due to the extra workload.    Pertinent History Patient is a 80 year old male who presents with recent history of multiple falls.  He has had a recent fall about a month ago and buttock and low back pain since that fall.  He has not received his platform walker yet.  Today's radiographs are negative for any identifiable fracture.  However, concerned with patient's increasing instability and troubles  with balance.  Only takes 1 fall for a potentially disastrous injury.  With this in mind, recommended physical therapy routine to optimize his balance and strength.  He is also had good experience at [facility] neurologic with Dr. Geraldine Contras.  Plan to refer him to [facility] neuro for evaluation of worsening balance and multiple falls.  In the meantime, recommended he start working with PT and obtain a platform walker.  Follow-up in 6 weeks for clinical recheck with Dr. August Saucer to ensure that his buttock and hip pain are progressively improving or resolved.    Limitations Standing;Walking    How long can you sit comfortably? >30 min    How long can you stand comfortably? <30 min    How long can you walk comfortably? <30 min    Patient Stated Goals to improve my balance    Pain Onset 1 to 4 weeks ago                               [facility] Adult PT Treatment/Exercise - 12/31/20 0001       Transfers   Transfers Sit to Stand;Stand to Sit    Sit to Stand 4: Min guard    Stand to Sit 4: Min guard    Comments ball b/t knees for 2x10 facilitating pelvis forward   for     Ambulation/Gait   Ambulation/Gait Yes    Ambulation/Gait Assistance 6: Modified independent (Device/Increase time);5: Supervision    Ambulation Distance (Feet) 250 Feet    Assistive device None    Gait Pattern Trunk flexed;Right flexed knee in stance;Left flexed knee in stance    Ambulation Surface Level;Indoor    Gait Comments 25ft around gym followed by 230 ft with wand behind back      Lumbar Exercises: Supine   Other Supine Lumbar Exercises L shouder flexion combined with R quad sets/glute sets to facilitate R psoas stertching, 15x ea.      Lumbar Exercises: Sidelying   Clam Right;10 reps;Other (comment)    Clam Limitations 2x15 against with manual facilitation    Other Sidelying Lumbar Exercises isolated glute medius abduction 2x15 using manual facilitation      Manual Therapy   Manual Therapy Soft tissue  mobilization    Soft tissue mobilization TP release to R piriformis in L sidelie x10'                       PT Short Term Goals - 12/31/20 1145       PT SHORT TERM GOAL #1   Title Pt will initiate HEP in order to indicate decreased fall risk and improved functional mobility.    Baseline 5x STS; 12/17/20 [id number]  Time 3    Period Weeks    Status Achieved    Target Date 12/28/20      PT SHORT TERM GOAL #2   Title Assess DGI and establish goal    Baseline TBD; 12/19/20 DGI score 23/24    Time 3    Period Weeks    Status Achieved    Target Date 12/28/20      PT SHORT TERM GOAL #3   Title Assess gait velocity and set appropriate goal; Goal is 0.6 m/s    Baseline TBD; 12/17/20 Gait velocity 0.34 m/s    Time 3    Period Weeks    Status Achieved    Target Date 12/28/20      PT SHORT TERM GOAL #4   Title Pt will ambulate up to 500' at independent level with improved posture using LRAD    Baseline 120ft in clinic with cane; 12/31/20 Ambulation of 222ft in clinic w/o AD under S    Time 3    Period Weeks    Status New    Target Date 12/28/20               PT Long Term Goals - 12/19/20 1425       PT LONG TERM GOAL #1   Title Patient to ambulate 1034ft with S and LRAD    Baseline 128ft with SPC and S    Time 6    Period Weeks    Status New      PT LONG TERM GOAL #2   Title Patient to negotiate 16 steps with most appropriate pattern and support    Baseline TBD    Time 6    Period Weeks    Status New      PT LONG TERM GOAL #3   Title patient to comlete 5xSTS in 14s or less as determined by age and gender    Baseline 17.1s w/o UE assist    Time 6    Period Weeks    Status New      PT LONG TERM GOAL #4   Title Patient to demo 0.60 m/s gait velocity which would equate to age and gender norms    Baseline TBD; 12/17/20 0.34 m/s    Time 6    Period Weeks    Status New      PT LONG TERM GOAL #5   Title Assess progress towards DGI goal    Baseline  TBD; 12/19/20 23/24 score, no goal needed    Time 6    Status Achieved                   Plan - 12/31/20 1209     Clinical Impression Statement Continued to work on R hip strengthening isolating gluteus medius and using functional combinatons of R shoulder flexion and GSs/QSs combined.  Performed STS with ball squeeze increasing to 2x10 and facilitating hips forward for posture and quad strengthening.  Assessed ambulation and found a more upright posture with good R hip stability noted but a drop to L pelvis suggesting weakness in abductors.    Personal Factors and Comorbidities Age;Past/Current Experience    Examination-Activity Limitations Transfers;Stairs;Locomotion Level    Examination-Participation Restrictions Yard Work    Stability/Clinical Decision Making Stable/Uncomplicated    Rehab Potential Good    PT Frequency 2x / week    PT Duration 6 weeks    PT Treatment/Interventions ADLs/Self Care Home Management;Aquatic Therapy;DME Instruction;Gait training;Stair training;Functional mobility training;Therapeutic activities;Therapeutic  exercise;Balance training;Neuromuscular re-education;Patient/family education    PT Next Visit Plan R piriformis release, hip flexor mobility, glute medius strengthening, assess L hip strength    PT Home Exercise Plan 5x STS; [id number]    Consulted and Agree with Plan of Care Patient             Patient will benefit from skilled therapeutic intervention in order to improve the following deficits and impairments:  Abnormal gait, Difficulty walking, Decreased endurance, Decreased activity tolerance, Decreased balance, Decreased mobility, Decreased strength  Visit Diagnosis: Unsteadiness on feet  Repeated falls  Balance disorder     Problem List Patient Active Problem List   Diagnosis Date Noted   SBO (small bowel obstruction) (HCC) 12/31/2019   Amaurosis fugax of right eye 08/25/2017   PSVT (paroxysmal supraventricular tachycardia)  (HCC) 09/12/2015   Chest pain 05/03/2015   Dyspnea 05/03/2015   ALLERGIC RHINITIS 09/21/2007   G E R D 09/21/2007   SMOKE INHALATION 09/21/2007   OSTEOPOROSIS 01/28/2007   PALPITATIONS 01/28/2007   COUGH 01/28/2007   ALLERGY 01/28/2007    Hildred Laser, PT 12/31/2020, 1:19 PM  [facility] [address] [place], [place], [address] Phone: [phone]   Fax:  [phone]  Name: Lee Nelson MRN: [id number] Date of Birth: 1940-05-23

## 2021-01-02 ENCOUNTER — Ambulatory Visit: Payer: Medicare Other

## 2021-01-02 ENCOUNTER — Other Ambulatory Visit: Payer: Self-pay

## 2021-01-02 DIAGNOSIS — R296 Repeated falls: Secondary | ICD-10-CM

## 2021-01-02 DIAGNOSIS — R2689 Other abnormalities of gait and mobility: Secondary | ICD-10-CM

## 2021-01-02 DIAGNOSIS — R2681 Unsteadiness on feet: Secondary | ICD-10-CM | POA: Diagnosis not present

## 2021-01-02 NOTE — Patient Instructions (Signed)
Access Code: [email or web address] URL: [email or web address] Date: 01/02/2021 Prepared by: Gustavus Bryant  Exercises Sit to Stand with Arms Crossed - 1 x daily - 7 x weekly - 1 sets - 5-10 reps Clamshell - 2 x daily - 7 x weekly - 2 sets - 10 reps Tandem Walking with Counter Support - 2 x daily - 7 x weekly - 2 sets - 5 reps Backward Tandem Walking with Counter Support - 2 x daily - 7 x weekly - 2 sets - 5 reps

## 2021-01-02 NOTE — Therapy (Signed)
[facility] [address] [place], [place], [address] Phone: [phone]   Fax:  [phone]  Physical Therapy Treatment  Patient Details  Name: Lee Nelson MRN: [id number] Date of Birth: 1940-07-01 Referring Provider (PT): [name] PA   Encounter Date: 01/02/2021   PT End of Session - 01/02/21 1234     Visit Number 8    Number of Visits 13    Date for PT Re-Evaluation 01/18/21    Authorization Type MCR/MCD    Authorization Time Period 9/2-11/2    Progress Note Due on Visit 10    PT Start Time 1230    PT Stop Time 1315    PT Time Calculation (min) 45 min    Equipment Utilized During Treatment Gait belt    Activity Tolerance Patient tolerated treatment well    Behavior During Therapy [facility] for tasks assessed/performed             Past Medical History:  Diagnosis Date   Anxiety    Arthritis    Bladder calculus    BPH (benign prostatic hyperplasia)    Chronic constipation    Complication of anesthesia    " I had some coughing afterwards for a couple of days"--  per pt "perfers spinal anesthesia since general anesthesia congnitive issues when older"   Diverticulosis of colon    Dry eye syndrome of both eyes    Environmental allergies    GERD (gastroesophageal reflux disease)    occasional   History of adenomatous polyp of colon    08/ 2004   History of kidney stones    History of squamous cell carcinoma in situ (SCCIS) of skin    s/p  excision 2013 facial areas and 06/ 2016 nose   Migraine    eye migraine occasional   Seasonal and perennial allergic rhinitis    Thrombocytopenia (HCC)    Tingling    hands and feet bilat , intermittantly-- per pt has lumbar bulging disk   Urinary frequency    Vocal fold atrophy    dysphonia-- per pt has to drink large amount of water to take even on pill   Wears glasses     Past Surgical History:  Procedure Laterality Date   APPENDECTOMY  child    CARDIOVASCULAR STRESS TEST  05-17-2015  dr hilty   Low risk nuclear study w/ no ischemia/  normal LV function and wall motion , stress ef 54% (lvef 45-54%)   CHOLECYSTECTOMY N/A 09/29/2014   Procedure: LAPAROSCOPIC CHOLECYSTECTOMY WITH INTRAOPERATIVE CHOLANGIOGRAM;  Surgeon: Ovidio Kin, MD;  Location: [facility];  Service: General;  Laterality: N/A;   COLONOSCOPY  last one 09-06-2010   ESOPHAGOGASTRODUODENOSCOPY N/A 12/09/2013   Procedure: ESOPHAGOGASTRODUODENOSCOPY (EGD);  Surgeon: Willis Modena, MD;  Location: [facility] ENDOSCOPY;  Service: Endoscopy;  Laterality: N/A;   EXTRACORPOREAL SHOCK WAVE LITHOTRIPSY  yrs ago   INGUINAL HERNIA REPAIR Left child   inguinal hernia repair  09/2017   NISSEN FUNDOPLICATION  1980's   open   STONE EXTRACTION WITH BASKET N/A 08/18/2016   Procedure: STONE EXTRACTION WITH BASKET;  Surgeon: Jethro Bolus, MD;  Location: [facility];  Service: Urology;  Laterality: N/A;   THULIUM LASER TURP (TRANSURETHRAL RESECTION OF PROSTATE) N/A 08/18/2016   Procedure: THULIUM LASER BLADDER NECK INCISION AND BLADDER STONE REMOVAL;  Surgeon: Jethro Bolus, MD;  Location: [facility];  Service: Urology;  Laterality: N/A;   TONSILLECTOMY  child   TRANSTHORACIC ECHOCARDIOGRAM  11/18/2010  grade 1 diastolic dysfunction, ef 55-60%/  trivial MR and TR/ mild dilated RA    There were no vitals filed for this visit.   Subjective Assessment - 01/02/21 1232     Subjective Reports he is discouraged and feels his balance is not being addressed.  Feels he needs more balance training but realizes he also has strength deficits    Pertinent History Patient is a 80 year old male who presents with recent history of multiple falls.  He has had a recent fall about a month ago and buttock and low back pain since that fall.  He has not received his platform walker yet.  Today's radiographs are negative for any identifiable fracture.  However, concerned with patient's  increasing instability and troubles with balance.  Only takes 1 fall for a potentially disastrous injury.  With this in mind, recommended physical therapy routine to optimize his balance and strength.  He is also had good experience at [facility] neurologic with Dr. Geraldine Contras.  Plan to refer him to [facility] neuro for evaluation of worsening balance and multiple falls.  In the meantime, recommended he start working with PT and obtain a platform walker.  Follow-up in 6 weeks for clinical recheck with Dr. August Saucer to ensure that his buttock and hip pain are progressively improving or resolved.    Limitations Standing;Walking    How long can you sit comfortably? >30 min    How long can you stand comfortably? <30 min    How long can you walk comfortably? <30 min    Patient Stated Goals to improve my balance    Pain Onset 1 to 4 weeks ago             Treatment time devoted to discussing and explaining neuromuscular components of balance including strength, flexibility, proprioception and vestibular system                  [facility] Adult PT Treatment/Exercise - 01/02/21 0001       Transfers   Transfers Sit to Stand;Stand to Sit    Sit to Stand 5: Supervision    Stand to Sit 5: Supervision      Ambulation/Gait   Ambulation/Gait Yes    Ambulation/Gait Assistance 6: Modified independent (Device/Increase time)    Ambulation Distance (Feet) 200 Feet    Assistive device Straight cane    Gait Pattern Trunk flexed;Right flexed knee in stance;Left flexed knee in stance    Ambulation Surface Level;Indoor    Gait Comments 269ft around gym                 Balance Exercises - 01/02/21 0001       Balance Exercises: Standing   Standing Eyes Opened Narrow base of support (BOS);Head turns;Foam/compliant surface;Solid surface;5 reps;Limitations;30 secs    Standing Eyes Opened Limitations 5 head turns/nods with no LOB    Standing Eyes Closed Narrow base of support (BOS);Head  turns;Foam/compliant surface;Solid surface;5 reps;30 secs;Limitations    Standing Eyes Closed Limitations 5 head turns/nods with no LOB    Tandem Gait Forward;Retro;Intermittent upper extremity support;4 reps;Limitations    Tandem Gait Limitations 4 trips in // bars starting with single UE support advancing to no UE support    Sidestepping 4 reps;Limitations    Sidestepping Limitations 4 trips in // bars w/o UE support                  PT Short Term Goals - 12/31/20 1145       PT  SHORT TERM GOAL #1   Title Pt will initiate HEP in order to indicate decreased fall risk and improved functional mobility.    Baseline 5x STS; 12/17/20 [id number]    Time 3    Period Weeks    Status Achieved    Target Date 12/28/20      PT SHORT TERM GOAL #2   Title Assess DGI and establish goal    Baseline TBD; 12/19/20 DGI score 23/24    Time 3    Period Weeks    Status Achieved    Target Date 12/28/20      PT SHORT TERM GOAL #3   Title Assess gait velocity and set appropriate goal; Goal is 0.6 m/s    Baseline TBD; 12/17/20 Gait velocity 0.34 m/s    Time 3    Period Weeks    Status Achieved    Target Date 12/28/20      PT SHORT TERM GOAL #4   Title Pt will ambulate up to 500' at independent level with improved posture using LRAD    Baseline 134ft in clinic with cane; 12/31/20 Ambulation of 295ft in clinic w/o AD under S    Time 3    Period Weeks    Status New    Target Date 12/28/20               PT Long Term Goals - 12/19/20 1425       PT LONG TERM GOAL #1   Title Patient to ambulate 1036ft with S and LRAD    Baseline 133ft with SPC and S    Time 6    Period Weeks    Status New      PT LONG TERM GOAL #2   Title Patient to negotiate 16 steps with most appropriate pattern and support    Baseline TBD    Time 6    Period Weeks    Status New      PT LONG TERM GOAL #3   Title patient to comlete 5xSTS in 14s or less as determined by age and gender    Baseline 17.1s w/o UE  assist    Time 6    Period Weeks    Status New      PT LONG TERM GOAL #4   Title Patient to demo 0.60 m/s gait velocity which would equate to age and gender norms    Baseline TBD; 12/17/20 0.34 m/s    Time 6    Period Weeks    Status New      PT LONG TERM GOAL #5   Title Assess progress towards DGI goal    Baseline TBD; 12/19/20 23/24 score, no goal needed    Time 6    Status Achieved                   Plan - 01/02/21 1259     Clinical Impression Statement Performed balance tasks in // bars including head turns/nods with EO/EC on flat an compliant surfaces.  Added tandem walking FWD/BWD 4 trips in // bars as well as sidestepping for 4 trips.  No issues noted with static balance challenges in EO/EC environment on solid and compliant surfaces.  Mild balance disturbances noted with dynamic challenges of tandem and retro walking    Personal Factors and Comorbidities Age;Past/Current Experience    Examination-Activity Limitations Transfers;Stairs;Locomotion Level    Examination-Participation Restrictions Yard Work    Stability/Clinical Decision Making Stable/Uncomplicated    Rehab Potential Good  PT Frequency 2x / week    PT Duration 6 weeks    PT Treatment/Interventions ADLs/Self Care Home Management;Aquatic Therapy;DME Instruction;Gait training;Stair training;Functional mobility training;Therapeutic activities;Therapeutic exercise;Balance training;Neuromuscular re-education;Patient/family education    PT Next Visit Plan Move from hip strengthening and piriformis interventions to static and dynamis balance tasks.    PT Home Exercise Plan 5x STS; [id number]    Consulted and Agree with Plan of Care Patient             Patient will benefit from skilled therapeutic intervention in order to improve the following deficits and impairments:  Abnormal gait, Difficulty walking, Decreased endurance, Decreased activity tolerance, Decreased balance, Decreased mobility, Decreased  strength  Visit Diagnosis: Unsteadiness on feet  Repeated falls  Balance disorder     Problem List Patient Active Problem List   Diagnosis Date Noted   SBO (small bowel obstruction) (HCC) 12/31/2019   Amaurosis fugax of right eye 08/25/2017   PSVT (paroxysmal supraventricular tachycardia) (HCC) 09/12/2015   Chest pain 05/03/2015   Dyspnea 05/03/2015   ALLERGIC RHINITIS 09/21/2007   G E R D 09/21/2007   SMOKE INHALATION 09/21/2007   OSTEOPOROSIS 01/28/2007   PALPITATIONS 01/28/2007   COUGH 01/28/2007   ALLERGY 01/28/2007    Hildred Laser, PT 01/02/2021, 2:20 PM  [facility] [address] [place], [place], [address] Phone: [phone]   Fax:  [phone]  Name: CARL BUTNER MRN: [id number] Date of Birth: 01-09-1941

## 2021-01-03 ENCOUNTER — Telehealth: Payer: Self-pay | Admitting: Orthopedic Surgery

## 2021-01-03 NOTE — Telephone Encounter (Signed)
Pt called to notify doctor that he fell on Tuesday and is experiencing a lot of pain. Pt has upcoming appt Monday but he is unable to sleep. Please call pt about this matter at [phone] or [phone].

## 2021-01-03 NOTE — Telephone Encounter (Signed)
sure

## 2021-01-03 NOTE — Telephone Encounter (Signed)
Worked patient in.  

## 2021-01-04 ENCOUNTER — Ambulatory Visit (INDEPENDENT_AMBULATORY_CARE_PROVIDER_SITE_OTHER): Payer: Medicare Other | Admitting: Orthopedic Surgery

## 2021-01-04 ENCOUNTER — Other Ambulatory Visit: Payer: Self-pay

## 2021-01-04 DIAGNOSIS — M25551 Pain in right hip: Secondary | ICD-10-CM | POA: Diagnosis not present

## 2021-01-04 NOTE — Telephone Encounter (Signed)
Saw him today 

## 2021-01-05 ENCOUNTER — Encounter: Payer: Self-pay | Admitting: Orthopedic Surgery

## 2021-01-05 NOTE — Progress Notes (Signed)
Office Visit Note   Patient: Lee Nelson           Date of Birth: 05-29-1940           MRN: [id number] Visit Date: 01/04/2021 Requested by: Chilton Greathouse, MD [address] [place],  [place] [address] PCP: Chilton Greathouse, MD  Subjective: Chief Complaint  Patient presents with   Other     Scan review    HPI: Lee Nelson is an 80 year old patient who has had multiple falls recently.  He has had MRI of the pelvis which shows no acute fracture.  He is able to walk with a cane.  Doing PT based treatment for balance and strengthening.  Patient states he does lean to the right little bit.  This is when he is walking.              ROS: All systems reviewed are negative as they relate to the chief complaint within the history of present illness.  Patient denies  fevers or chills.   Assessment & Plan: Visit Diagnoses:  1. Pain in right hip     Plan: Impression is no acute fracture of the sacrum or hips or pelvic region.  Mild arthritis present in both hips a little bit worse on the right.  No significant hip effusion on MRI scan.  Overall Trust has very good hip flexion strength.  His balance is off.  He is taking some supplements to help with neurodegenerative conditions which may or may not be present.  For now he has no orthopedic acute problem.  However my concern is with his history of osteopia/osteoporosis that recurrent falls could put him at risk for hip or proximal humerus fracture.  Patient does use a cane but a walker is a little too big for the narrow hallways in his  otherwise spacious house.  He will follow-up as needed.  Follow-Up Instructions: Return if symptoms worsen or fail to improve.   Orders:  No orders of the defined types were placed in this encounter.  No orders of the defined types were placed in this encounter.     Procedures: No procedures performed   Clinical Data: No additional findings.  Objective: Vital Signs: There were no vitals taken  for this visit.  Physical Exam:   Constitutional: Patient appears well-developed HEENT:  Head: Normocephalic Eyes:EOM are normal Neck: Normal range of motion Cardiovascular: Normal rate Pulmonary/chest: Effort normal Neurologic: Patient is alert Skin: Skin is warm Psychiatric: Patient has normal mood and affect   Ortho Exam: Ortho exam demonstrates 5 out of 5 hip flexion strength bilaterally and extension strength at the knees bilaterally.  No groin pain with internal ex rotation of the leg.  Has a little bit of right-sided distal SI joint tenderness.  No nerve root tension signs.  Weight is 120.  Specialty Comments:  No specialty comments available.  Imaging: No results found.   PMFS History: Patient Active Problem List   Diagnosis Date Noted   SBO (small bowel obstruction) (HCC) 12/31/2019   Amaurosis fugax of right eye 08/25/2017   PSVT (paroxysmal supraventricular tachycardia) (HCC) 09/12/2015   Chest pain 05/03/2015   Dyspnea 05/03/2015   ALLERGIC RHINITIS 09/21/2007   G E R D 09/21/2007   SMOKE INHALATION 09/21/2007   OSTEOPOROSIS 01/28/2007   PALPITATIONS 01/28/2007   COUGH 01/28/2007   ALLERGY 01/28/2007   Past Medical History:  Diagnosis Date   Anxiety    Arthritis    Bladder calculus  BPH (benign prostatic hyperplasia)    Chronic constipation    Complication of anesthesia    " I had some coughing afterwards for a couple of days"--  per pt "perfers spinal anesthesia since general anesthesia congnitive issues when older"   Diverticulosis of colon    Dry eye syndrome of both eyes    Environmental allergies    GERD (gastroesophageal reflux disease)    occasional   History of adenomatous polyp of colon    08/ 2004   History of kidney stones    History of squamous cell carcinoma in situ (SCCIS) of skin    s/p  excision 2013 facial areas and 06/ 2016 nose   Migraine    eye migraine occasional   Seasonal and perennial allergic rhinitis     Thrombocytopenia (HCC)    Tingling    hands and feet bilat , intermittantly-- per pt has lumbar bulging disk   Urinary frequency    Vocal fold atrophy    dysphonia-- per pt has to drink large amount of water to take even on pill   Wears glasses     Family History  Problem Relation Age of Onset   Parkinsonism Brother    COPD Mother    Allergies Mother    Heart failure Mother    COPD Father    Stroke Father    Other Sister    Suicidality Maternal Aunt    Cancer Maternal Grandfather     Past Surgical History:  Procedure Laterality Date   APPENDECTOMY  child   CARDIOVASCULAR STRESS TEST  05-17-2015  dr hilty   Low risk nuclear study w/ no ischemia/  normal LV function and wall motion , stress ef 54% (lvef 45-54%)   CHOLECYSTECTOMY N/A 09/29/2014   Procedure: LAPAROSCOPIC CHOLECYSTECTOMY WITH INTRAOPERATIVE CHOLANGIOGRAM;  Surgeon: Ovidio Kin, MD;  Location: [facility];  Service: General;  Laterality: N/A;   COLONOSCOPY  last one 09-06-2010   ESOPHAGOGASTRODUODENOSCOPY N/A 12/09/2013   Procedure: ESOPHAGOGASTRODUODENOSCOPY (EGD);  Surgeon: Willis Modena, MD;  Location: [facility];  Service: Endoscopy;  Laterality: N/A;   EXTRACORPOREAL SHOCK WAVE LITHOTRIPSY  yrs ago   INGUINAL HERNIA REPAIR Left child   inguinal hernia repair  09/2017   NISSEN FUNDOPLICATION  1980's   open   STONE EXTRACTION WITH BASKET N/A 08/18/2016   Procedure: STONE EXTRACTION WITH BASKET;  Surgeon: Jethro Bolus, MD;  Location: [facility];  Service: Urology;  Laterality: N/A;   THULIUM LASER TURP (TRANSURETHRAL RESECTION OF PROSTATE) N/A 08/18/2016   Procedure: THULIUM LASER BLADDER NECK INCISION AND BLADDER STONE REMOVAL;  Surgeon: Jethro Bolus, MD;  Location: [facility];  Service: Urology;  Laterality: N/A;   TONSILLECTOMY  child   TRANSTHORACIC ECHOCARDIOGRAM  11/18/2010   grade 1 diastolic dysfunction, ef 55-60%/  trivial MR and TR/ mild dilated RA   Social  History   Occupational History   Occupation: teaches constitutional law   Occupation: Photographer: [employer]  Tobacco Use   Smoking status: Never   Smokeless tobacco: Never  Substance and Sexual Activity   Alcohol use: Yes    Comment: social   Drug use: No   Sexual activity: Not on file

## 2021-01-07 ENCOUNTER — Ambulatory Visit: Payer: Medicare Other | Admitting: Orthopedic Surgery

## 2021-01-07 ENCOUNTER — Ambulatory Visit: Payer: Medicare Other

## 2021-01-07 ENCOUNTER — Other Ambulatory Visit: Payer: Self-pay

## 2021-01-07 DIAGNOSIS — R2689 Other abnormalities of gait and mobility: Secondary | ICD-10-CM

## 2021-01-07 DIAGNOSIS — R296 Repeated falls: Secondary | ICD-10-CM

## 2021-01-07 DIAGNOSIS — R2681 Unsteadiness on feet: Secondary | ICD-10-CM | POA: Diagnosis not present

## 2021-01-07 NOTE — Therapy (Signed)
[facility] [address] [place], [place], [address] Phone: [phone]   Fax:  [phone]  Physical Therapy Treatment  Patient Details  Name: Lee Nelson MRN: [id number] Date of Birth: 19-Mar-1941 Referring Provider (PT): [name] PA   Encounter Date: 01/07/2021   PT End of Session - 01/07/21 1228     Visit Number 9    Number of Visits 13    Date for PT Re-Evaluation 01/18/21    Authorization Type MCR/MCD    Authorization Time Period 9/2-11/2    Progress Note Due on Visit 10    PT Start Time 1230    PT Stop Time 1315    PT Time Calculation (min) 45 min    Equipment Utilized During Treatment Gait belt    Activity Tolerance Patient tolerated treatment well    Behavior During Therapy [facility] for tasks assessed/performed             Past Medical History:  Diagnosis Date   Anxiety    Arthritis    Bladder calculus    BPH (benign prostatic hyperplasia)    Chronic constipation    Complication of anesthesia    " I had some coughing afterwards for a couple of days"--  per pt "perfers spinal anesthesia since general anesthesia congnitive issues when older"   Diverticulosis of colon    Dry eye syndrome of both eyes    Environmental allergies    GERD (gastroesophageal reflux disease)    occasional   History of adenomatous polyp of colon    08/ 2004   History of kidney stones    History of squamous cell carcinoma in situ (SCCIS) of skin    s/p  excision 2013 facial areas and 06/ 2016 nose   Migraine    eye migraine occasional   Seasonal and perennial allergic rhinitis    Thrombocytopenia (HCC)    Tingling    hands and feet bilat , intermittantly-- per pt has lumbar bulging disk   Urinary frequency    Vocal fold atrophy    dysphonia-- per pt has to drink large amount of water to take even on pill   Wears glasses     Past Surgical History:  Procedure Laterality Date   APPENDECTOMY  child    CARDIOVASCULAR STRESS TEST  05-17-2015  dr hilty   Low risk nuclear study w/ no ischemia/  normal LV function and wall motion , stress ef 54% (lvef 45-54%)   CHOLECYSTECTOMY N/A 09/29/2014   Procedure: LAPAROSCOPIC CHOLECYSTECTOMY WITH INTRAOPERATIVE CHOLANGIOGRAM;  Surgeon: Ovidio Kin, MD;  Location: [facility];  Service: General;  Laterality: N/A;   COLONOSCOPY  last one 09-06-2010   ESOPHAGOGASTRODUODENOSCOPY N/A 12/09/2013   Procedure: ESOPHAGOGASTRODUODENOSCOPY (EGD);  Surgeon: Willis Modena, MD;  Location: [facility] ENDOSCOPY;  Service: Endoscopy;  Laterality: N/A;   EXTRACORPOREAL SHOCK WAVE LITHOTRIPSY  yrs ago   INGUINAL HERNIA REPAIR Left child   inguinal hernia repair  09/2017   NISSEN FUNDOPLICATION  1980's   open   STONE EXTRACTION WITH BASKET N/A 08/18/2016   Procedure: STONE EXTRACTION WITH BASKET;  Surgeon: Jethro Bolus, MD;  Location: [facility];  Service: Urology;  Laterality: N/A;   THULIUM LASER TURP (TRANSURETHRAL RESECTION OF PROSTATE) N/A 08/18/2016   Procedure: THULIUM LASER BLADDER NECK INCISION AND BLADDER STONE REMOVAL;  Surgeon: Jethro Bolus, MD;  Location: [facility];  Service: Urology;  Laterality: N/A;   TONSILLECTOMY  child   TRANSTHORACIC ECHOCARDIOGRAM  11/18/2010  grade 1 diastolic dysfunction, ef 55-60%/  trivial MR and TR/ mild dilated RA    There were no vitals filed for this visit.   Subjective Assessment - 01/07/21 1231     Subjective Saw MD regarding hip MRI, no significant findings    Pertinent History Patient is a 80 year old male who presents with recent history of multiple falls.  He has had a recent fall about a month ago and buttock and low back pain since that fall.  He has not received his platform walker yet.  Today's radiographs are negative for any identifiable fracture.  However, concerned with patient's increasing instability and troubles with balance.  Only takes 1 fall for a potentially disastrous  injury.  With this in mind, recommended physical therapy routine to optimize his balance and strength.  He is also had good experience at [facility] neurologic with Dr. Geraldine Contras.  Plan to refer him to [facility] neuro for evaluation of worsening balance and multiple falls.  In the meantime, recommended he start working with PT and obtain a platform walker.  Follow-up in 6 weeks for clinical recheck with Dr. August Saucer to ensure that his buttock and hip pain are progressively improving or resolved.    Limitations Standing;Walking    How long can you sit comfortably? >30 min    How long can you stand comfortably? <30 min    How long can you walk comfortably? <30 min    Patient Stated Goals to improve my balance    Pain Onset 1 to 4 weeks ago                               [facility] Adult PT Treatment/Exercise - 01/07/21 0001       Transfers   Transfers Sit to Stand;Stand to Sit    Sit to Stand 5: Supervision    Stand to Sit 5: Supervision      Ambulation/Gait   Ambulation/Gait Yes    Ambulation/Gait Assistance 6: Modified independent (Device/Increase time)    Ambulation Distance (Feet) 450 Feet    Assistive device Straight cane;None    Gait Pattern Trunk flexed;Right flexed knee in stance;Left flexed knee in stance    Ambulation Surface Level;Indoor    Gait Comments 411ft around gym      Knee/Hip Exercises: Stretches   Hip Flexor Stretch Right    Hip Flexor Stretch Limitations 2 min hold                 Balance Exercises - 01/07/21 0001       Balance Exercises: Standing   SLS with Vectors Solid surface;Intermittent upper extremity assist;Limitations    SLS with Vectors Limitations Tapping floot targets 2 targets unilateral, 2 targets alt, 2 targets cross body unilateral, cross body 2 targets alt, then alt to single target all for 10 reps.    Rockerboard Anterior/posterior;Lateral;30 seconds;EO;EC;Intermittent UE support;Limitations    Rockerboard Limitations 30s  hold EO/EC positions, 30s hold ea. position, 2x with EO    Tandem Gait Forward;Retro;Intermittent upper extremity support;Limitations;5 reps    Tandem Gait Limitations 5 trips in // bars starting with single UE support advancing to no UE support    Sidestepping 5 reps;Limitations    Sidestepping Limitations 5 trips in // bars w/o UE support                  PT Short Term Goals - 12/31/20 1145  PT SHORT TERM GOAL #1   Title Pt will initiate HEP in order to indicate decreased fall risk and improved functional mobility.    Baseline 5x STS; 12/17/20 [id number]    Time 3    Period Weeks    Status Achieved    Target Date 12/28/20      PT SHORT TERM GOAL #2   Title Assess DGI and establish goal    Baseline TBD; 12/19/20 DGI score 23/24    Time 3    Period Weeks    Status Achieved    Target Date 12/28/20      PT SHORT TERM GOAL #3   Title Assess gait velocity and set appropriate goal; Goal is 0.6 m/s    Baseline TBD; 12/17/20 Gait velocity 0.34 m/s    Time 3    Period Weeks    Status Achieved    Target Date 12/28/20      PT SHORT TERM GOAL #4   Title Pt will ambulate up to 500' at independent level with improved posture using LRAD    Baseline 133ft in clinic with cane; 12/31/20 Ambulation of 234ft in clinic w/o AD under S    Time 3    Period Weeks    Status New    Target Date 12/28/20               PT Long Term Goals - 12/19/20 1425       PT LONG TERM GOAL #1   Title Patient to ambulate 1058ft with S and LRAD    Baseline 157ft with SPC and S    Time 6    Period Weeks    Status New      PT LONG TERM GOAL #2   Title Patient to negotiate 16 steps with most appropriate pattern and support    Baseline TBD    Time 6    Period Weeks    Status New      PT LONG TERM GOAL #3   Title patient to comlete 5xSTS in 14s or less as determined by age and gender    Baseline 17.1s w/o UE assist    Time 6    Period Weeks    Status New      PT LONG TERM GOAL #4    Title Patient to demo 0.60 m/s gait velocity which would equate to age and gender norms    Baseline TBD; 12/17/20 0.34 m/s    Time 6    Period Weeks    Status New      PT LONG TERM GOAL #5   Title Assess progress towards DGI goal    Baseline TBD; 12/19/20 23/24 score, no goal needed    Time 6    Status Achieved                   Plan - 01/07/21 1431     Clinical Impression Statement Todays session focused on advanced gait and balance training in // bars, following hip flexor stretch R, ambulation of 476ft without AD.  Advanced to RB challenges with deficits noted in [facility] environment in M/L direction.  L hip abductor weakness evident in gait assessment.  More proficient with tandem gait today in // bars.  B PF weakness noted R>L with SLS    Personal Factors and Comorbidities Age;Past/Current Experience    Examination-Activity Limitations Transfers;Stairs;Locomotion Level    Examination-Participation Restrictions Yard Work    Conservation officer, historic buildings Stable/Uncomplicated    Rehab  Potential Good    PT Frequency 2x / week    PT Duration 6 weeks    PT Treatment/Interventions ADLs/Self Care Home Management;Aquatic Therapy;DME Instruction;Gait training;Stair training;Functional mobility training;Therapeutic activities;Therapeutic exercise;Balance training;Neuromuscular re-education;Patient/family education    PT Next Visit Plan Move from hip strengthening and piriformis interventions to static and dynamis balance tasks, floor targets from airex, PF strengthening    PT Home Exercise Plan 5x STS; [id number]    Consulted and Agree with Plan of Care Patient             Patient will benefit from skilled therapeutic intervention in order to improve the following deficits and impairments:  Abnormal gait, Difficulty walking, Decreased endurance, Decreased activity tolerance, Decreased balance, Decreased mobility, Decreased strength  Visit Diagnosis: Unsteadiness on  feet  Repeated falls  Balance disorder     Problem List Patient Active Problem List   Diagnosis Date Noted   SBO (small bowel obstruction) (HCC) 12/31/2019   Amaurosis fugax of right eye 08/25/2017   PSVT (paroxysmal supraventricular tachycardia) (HCC) 09/12/2015   Chest pain 05/03/2015   Dyspnea 05/03/2015   ALLERGIC RHINITIS 09/21/2007   G E R D 09/21/2007   SMOKE INHALATION 09/21/2007   OSTEOPOROSIS 01/28/2007   PALPITATIONS 01/28/2007   COUGH 01/28/2007   ALLERGY 01/28/2007    Hildred Laser, PT 01/07/2021, 2:38 PM  [facility] [address] [place], [place], [address] Phone: [phone]   Fax:  [phone]  Name: THELONIOUS KAUFFMANN MRN: [id number] Date of Birth: [birth year]

## 2021-01-09 ENCOUNTER — Ambulatory Visit: Payer: Medicare Other

## 2021-01-09 ENCOUNTER — Other Ambulatory Visit: Payer: Self-pay

## 2021-01-09 DIAGNOSIS — R2681 Unsteadiness on feet: Secondary | ICD-10-CM

## 2021-01-09 DIAGNOSIS — R296 Repeated falls: Secondary | ICD-10-CM

## 2021-01-09 DIAGNOSIS — R2689 Other abnormalities of gait and mobility: Secondary | ICD-10-CM

## 2021-01-09 NOTE — Therapy (Signed)
[facility] [address] [place], [place], [address] Phone: [phone]   Fax:  [phone]  Physical Therapy Treatment/Progress Note  Patient Details  Name: Lee Nelson MRN: [id number] Date of Birth: 10-May-1940 Referring Provider (PT): Gloriann Loan PA   Encounter Date: 01/09/2021     Dates of Reporting Period:11/30/20-01/09/21  See Note below for Objective Data and Assessment of Progress/Goals.     PT End of Session - 01/09/21 1415     Visit Number 10    Number of Visits 13    Date for PT Re-Evaluation 01/18/21    Authorization Type MCR/MCD    Authorization Time Period 9/2-11/2    Progress Note Due on Visit 10    PT Start Time 1230    PT Stop Time 1315    PT Time Calculation (min) 45 min    Equipment Utilized During Treatment Gait belt    Activity Tolerance Patient tolerated treatment well    Behavior During Therapy WFL for tasks assessed/performed             Past Medical History:  Diagnosis Date   Anxiety    Arthritis    Bladder calculus    BPH (benign prostatic hyperplasia)    Chronic constipation    Complication of anesthesia    " I had some coughing afterwards for a couple of days"--  per pt "perfers spinal anesthesia since general anesthesia congnitive issues when older"   Diverticulosis of colon    Dry eye syndrome of both eyes    Environmental allergies    GERD (gastroesophageal reflux disease)    occasional   History of adenomatous polyp of colon    08/ 2004   History of kidney stones    History of squamous cell carcinoma in situ (SCCIS) of skin    s/p  excision 2013 facial areas and 06/ 2016 nose   Migraine    eye migraine occasional   Seasonal and perennial allergic rhinitis    Thrombocytopenia (HCC)    Tingling    hands and feet bilat , intermittantly-- per pt has lumbar bulging disk   Urinary frequency    Vocal fold atrophy    dysphonia-- per pt has to drink large  amount of water to take even on pill   Wears glasses     Past Surgical History:  Procedure Laterality Date   APPENDECTOMY  child   CARDIOVASCULAR STRESS TEST  05-17-2015  dr hilty   Low risk nuclear study w/ no ischemia/  normal LV function and wall motion , stress ef 54% (lvef 45-54%)   CHOLECYSTECTOMY N/A 09/29/2014   Procedure: LAPAROSCOPIC CHOLECYSTECTOMY WITH INTRAOPERATIVE CHOLANGIOGRAM;  Surgeon: Alphonsa Overall, MD;  Location: [facility];  Service: General;  Laterality: N/A;   COLONOSCOPY  last one 09-06-2010   ESOPHAGOGASTRODUODENOSCOPY N/A 12/09/2013   Procedure: ESOPHAGOGASTRODUODENOSCOPY (EGD);  Surgeon: Arta Silence, MD;  Location[facility] END[facility]COPY;  Service: Endoscopy;  Laterality: N/A;   EXTRACORPOREAL SHOCK WAVE LITHOTRIPSY  yrs ago   INGUINAL HERNIA REPAIR Left child   inguinal hernia repair  51/8343   NISSEN FUNDOPLICATION  7357'I   open   STONE EXTRACTION WITH BASKET N/A 08/18/2016   Procedure: STONE EXTRACTION WITH BASKET;  Surgeon: Carolan Clines, MD;  Location: [facility];  Service: Urology;  Laterality: N/A;   THULIUM LASER TURP (TRANSURETHRAL RESECTION OF PROSTATE) N/A 08/18/2016   Procedure: THULIUM LASER BLADDER NECK INCISION AND BLADDER STONE REMOVAL;  Surgeon: Carolan Clines, MD;  Location: [facility];  Service: Urology;  Laterality: N/A;   TONSILLECTOMY  child   TRANSTHORACIC ECHOCARDIOGRAM  11/18/2010   grade 1 diastolic dysfunction, ef 02-40%/  trivial MR and TR/ mild dilated RA    There were no vitals filed for this visit.   Subjective Assessment - 01/09/21 1240     Subjective Sustained a fall the other day as he slid out of bed due to satin sheets.    Pertinent History Patient is a 80 year old male who presents with recent history of multiple falls.  He has had a recent fall about a month ago and buttock and low back pain since that fall.  He has not received his platform walker yet.  Today's radiographs are negative for  any identifiable fracture.  However, concerned with patient's increasing instability and troubles with balance.  Only takes 1 fall for a potentially disastrous injury.  With this in mind, recommended physical therapy routine to optimize his balance and strength.  He is also had good experience at [facility] neurologic with Dr. [name].  Plan to refer him to [facility] neuro for evaluation of worsening balance and multiple falls.  In the meantime, recommended he start working with PT and obtain a platform walker.  Follow-up in 6 weeks for clinical recheck with Dr. Marlou Sa to ensure that his buttock and hip pain are progressively improving or resolved.    Limitations Standing;Walking    How long can you sit comfortably? >30 min    How long can you stand comfortably? <30 min    How long can you walk comfortably? <30 min    Patient Stated Goals to improve my balance    Pain Onset 1 to 4 weeks ago                               [facility] Adult PT Treatment/Exercise - 01/09/21 0001       Transfers   Transfers Sit to Stand;Stand to Sit    Sit to Stand 5: Supervision    Stand to Sit 5: Supervision      Ambulation/Gait   Ambulation/Gait Yes    Ambulation/Gait Assistance 6: Modified independent (Device/Increase time)    Ambulation Distance (Feet) 500 Feet    Assistive device Straight cane;None    Gait Pattern Trunk flexed;Right flexed knee in stance;Left flexed knee in stance    Ambulation Surface Level;Indoor    Gait Comments throughout gym making random turns and pivot turns                 Balance Exercises - 01/09/21 0001       Balance Exercises: Standing   SLS with Vectors Foam/compliant surface    SLS with Vectors Limitations Tapping floot targets 2 targets unilateral, 2 targets alt, 2 targets cross body unilateral, cross body 2 targets alt, then alt to single target all for 10 reps.    Rockerboard Anterior/posterior;Lateral;30 seconds;Limitations    Rockerboard  Limitations 60s hold in ea. position followed by rocking 15x in ea. direction.  Unable to perform with EC due to proprioceptive deficits                  PT Short Term Goals - 12/31/20 1145       PT SHORT TERM GOAL #1   Title Pt will initiate HEP in order to indicate decreased fall risk and improved functional mobility.    Baseline 5x STS; 12/17/20 [id number]  Time 3    Period Weeks    Status Achieved    Target Date 12/28/20      PT SHORT TERM GOAL #2   Title Assess DGI and establish goal    Baseline TBD; 12/19/20 DGI score 23/24    Time 3    Period Weeks    Status Achieved    Target Date 12/28/20      PT SHORT TERM GOAL #3   Title Assess gait velocity and set appropriate goal; Goal is 0.6 m/s    Baseline TBD; 12/17/20 Gait velocity 0.34 m/s    Time 3    Period Weeks    Status Achieved    Target Date 12/28/20      PT SHORT TERM GOAL #4   Title Pt will ambulate up to 500' at independent level with improved posture using LRAD    Baseline 187f in clinic with cane; 12/31/20 Ambulation of 2522fin clinic w/o AD under S    Time 3    Period Weeks    Status New    Target Date 12/28/20               PT Long Term Goals - 12/19/20 1425       PT LONG TERM GOAL #1   Title Patient to ambulate 100019fith S and LRAD    Baseline 100f39fth SPC and S    Time 6    Period Weeks    Status New      PT LONG TERM GOAL #2   Title Patient to negotiate 16 steps with most appropriate pattern and support    Baseline TBD    Time 6    Period Weeks    Status New      PT LONG TERM GOAL #3   Title patient to comlete 5xSTS in 14s or less as determined by age and gender    Baseline 17.1s w/o UE assist    Time 6    Period Weeks    Status New      PT LONG TERM GOAL #4   Title Patient to demo 0.60 m/s gait velocity which would equate to age and gender norms    Baseline TBD; 12/17/20 0.34 m/s    Time 6    Period Weeks    Status New      PT LONG TERM GOAL #5   Title Assess  progress towards DGI goal    Baseline TBD; 12/19/20 23/24 score, no goal needed    Time 6    Status Achieved                   Plan - 01/09/21 1416     Clinical Impression Statement todays session assessed B hip strength which noted good glute medius strength B but L abduction weakness.  Performed gait training with random turns to assess balance, no LOB observed.  Advanced to floor targets from foam surface and added active ROM tasks on RB.  Frequent need of UE support needed with RB tasks with patient unable to perform tasks with EC.  patient showing more ankle deficits than hip deficits.  All STGs met    Personal Factors and Comorbidities Age;Past/Current Experience    Examination-Activity Limitations Transfers;Stairs;Locomotion Level    Examination-Participation Restrictions Yard Work    Stability/Clinical Decision Making Stable/Uncomplicated    Rehab Potential Good    PT Frequency 2x / week    PT Duration 6 weeks    PT  Treatment/Interventions ADLs/Self Care Home Management;Aquatic Therapy;DME Instruction;Gait training;Stair training;Functional mobility training;Therapeutic activities;Therapeutic exercise;Balance training;Neuromuscular re-education;Patient/family education    PT Next Visit Plan Move from hip strengthening and piriformis interventions to static and dynamis balance tasks, floor targets from airex, PF strengthening, ankle proprioception    PT Home Exercise Plan 5x STS; [id number]    Consulted and Agree with Plan of Care Patient             Patient will benefit from skilled therapeutic intervention in order to improve the following deficits and impairments:  Abnormal gait, Difficulty walking, Decreased endurance, Decreased activity tolerance, Decreased balance, Decreased mobility, Decreased strength  Visit Diagnosis: Unsteadiness on feet  Repeated falls  Balance disorder     Problem List Patient Active Problem List   Diagnosis Date Noted   SBO (small  bowel obstruction) ([place]) 12/31/2019   Amaurosis fugax of right eye 08/25/2017   PSVT (paroxysmal supraventricular tachycardia) ([place]) 09/12/2015   Chest pain 05/03/2015   Dyspnea 05/03/2015   ALLERGIC RHINITIS 09/21/2007   G E R D 09/21/2007   SMOKE INHALATION 09/21/2007   OSTEOPOROSIS 01/28/2007   PALPITATIONS 01/28/2007   COUGH 01/28/2007   ALLERGY 01/28/2007    Lanice Shirts, PT 01/09/2021, 2:24 PM  [facility] [address] [place], [place], [address] Phone: [phone]   Fax:  [phone]  Name: LADD CEN MRN: [id number] Date of Birth: 08/13/1940

## 2021-01-14 ENCOUNTER — Other Ambulatory Visit: Payer: Self-pay

## 2021-01-14 ENCOUNTER — Ambulatory Visit: Payer: Medicare Other

## 2021-01-14 DIAGNOSIS — R2681 Unsteadiness on feet: Secondary | ICD-10-CM | POA: Diagnosis not present

## 2021-01-14 DIAGNOSIS — R296 Repeated falls: Secondary | ICD-10-CM

## 2021-01-14 DIAGNOSIS — R2689 Other abnormalities of gait and mobility: Secondary | ICD-10-CM

## 2021-01-14 NOTE — Therapy (Signed)
[facility] [address] [place], [place], [address] Phone: [phone]   Fax:  [phone]  Physical Therapy Treatment  Patient Details  Name: Lee Nelson MRN: [id number] Date of Birth: 1941/02/11 Referring Provider (PT): Gloriann Loan PA   Encounter Date: 01/14/2021   PT End of Session - 01/14/21 1230     Visit Number 11    Number of Visits 13    Date for PT Re-Evaluation 01/18/21    Authorization Type MCR/MCD    Authorization Time Period 9/2-11/2    Progress Note Due on Visit 10    PT Start Time 1324    PT Stop Time 1315    PT Time Calculation (min) 45 min    Equipment Utilized During Treatment Gait belt    Activity Tolerance Patient tolerated treatment well    Behavior During Therapy [facility] for tasks assessed/performed             Past Medical History:  Diagnosis Date   Anxiety    Arthritis    Bladder calculus    BPH (benign prostatic hyperplasia)    Chronic constipation    Complication of anesthesia    " I had some coughing afterwards for a couple of days"--  per pt "perfers spinal anesthesia since general anesthesia congnitive issues when older"   Diverticulosis of colon    Dry eye syndrome of both eyes    Environmental allergies    GERD (gastroesophageal reflux disease)    occasional   History of adenomatous polyp of colon    08/ 2004   History of kidney stones    History of squamous cell carcinoma in situ (SCCIS) of skin    s/p  excision 2013 facial areas and 06/ 2016 nose   Migraine    eye migraine occasional   Seasonal and perennial allergic rhinitis    Thrombocytopenia (HCC)    Tingling    hands and feet bilat , intermittantly-- per pt has lumbar bulging disk   Urinary frequency    Vocal fold atrophy    dysphonia-- per pt has to drink large amount of water to take even on pill   Wears glasses     Past Surgical History:  Procedure Laterality Date   APPENDECTOMY  child    CARDIOVASCULAR STRESS TEST  05-17-2015  dr hilty   Low risk nuclear study w/ no ischemia/  normal LV function and wall motion , stress ef 54% (lvef 45-54%)   CHOLECYSTECTOMY N/A 09/29/2014   Procedure: LAPAROSCOPIC CHOLECYSTECTOMY WITH INTRAOPERATIVE CHOLANGIOGRAM;  Surgeon: Alphonsa Overall, MD;  Location: [facility];  Service: General;  Laterality: N/A;   COLONOSCOPY  last one 09-06-2010   ESOPHAGOGASTRODUODENOSCOPY N/A 12/09/2013   Procedure: ESOPHAGOGASTRODUODENOSCOPY (EGD);  Surgeon: Arta Silence, MD;  Location: [facility] ENDOSCOPY;  Service: Endoscopy;  Laterality: N/A;   EXTRACORPOREAL SHOCK WAVE LITHOTRIPSY  yrs ago   INGUINAL HERNIA REPAIR Left child   inguinal hernia repair  40/1027   NISSEN FUNDOPLICATION  2536'U   open   STONE EXTRACTION WITH BASKET N/A 08/18/2016   Procedure: STONE EXTRACTION WITH BASKET;  Surgeon: Carolan Clines, MD;  Location: [facility];  Service: Urology;  Laterality: N/A;   THULIUM LASER TURP (TRANSURETHRAL RESECTION OF PROSTATE) N/A 08/18/2016   Procedure: THULIUM LASER BLADDER NECK INCISION AND BLADDER STONE REMOVAL;  Surgeon: Carolan Clines, MD;  Location: [facility];  Service: Urology;  Laterality: N/A;   TONSILLECTOMY  child   TRANSTHORACIC ECHOCARDIOGRAM  11/18/2010  grade 1 diastolic dysfunction, ef 70-35%/  trivial MR and TR/ mild dilated RA    There were no vitals filed for this visit.   Subjective Assessment - 01/14/21 1400     Subjective No changes or falls to note.    Pertinent History Patient is a 80 year old male who presents with recent history of multiple falls.  He has had a recent fall about a month ago and buttock and low back pain since that fall.  He has not received his platform walker yet.  Today's radiographs are negative for any identifiable fracture.  However, concerned with patient's increasing instability and troubles with balance.  Only takes 1 fall for a potentially disastrous injury.  With this in  mind, recommended physical therapy routine to optimize his balance and strength.  He is also had good experience at [facility] neurologic with Dr. [name].  Plan to refer him to [facility] neuro for evaluation of worsening balance and multiple falls.  In the meantime, recommended he start working with PT and obtain a platform walker.  Follow-up in 6 weeks for clinical recheck with Dr. Marlou Sa to ensure that his buttock and hip pain are progressively improving or resolved.    Limitations Standing;Walking    How long can you sit comfortably? >30 min    How long can you stand comfortably? <30 min    How long can you walk comfortably? <30 min    Patient Stated Goals to improve my balance    Pain Onset 1 to 4 weeks ago                               [facility] Adult PT Treatment/Exercise - 01/14/21 0001       Transfers   Transfers Sit to Stand;Stand to Sit    Sit to Stand 6: Modified independent (Device/Increase time)    Stand to Sit 6: Modified independent (Device/Increase time)      Ambulation/Gait   Ambulation/Gait Yes    Ambulation/Gait Assistance 6: Modified independent (Device/Increase time)    Ambulation Distance (Feet) 500 Feet    Assistive device Straight cane;None    Gait Pattern Trunk flexed;Right flexed knee in stance;Left flexed knee in stance    Ambulation Surface Level;Indoor    Stairs Yes    Stairs Assistance 6: Modified independent (Device/Increase time)    Stair Management Technique One rail Right;Alternating pattern    Number of Stairs 16    Height of Stairs 6    Gait Comments throughout gym making random turns and pivot turns      Lumbar Exercises: Sidelying   Clam Both;10 reps;Limitations    Clam Limitations 4/5 strength B    Hip Abduction Both;10 reps    Hip Abduction Limitations 3/5 strength B                 Balance Exercises - 01/14/21 0001       Balance Exercises: Standing   Stepping Strategy Anterior;Lateral;Limitations    Stepping  Strategy Limitations 4" block, 15x ea. direction    Rockerboard Anterior/posterior;Lateral;EO;EC;30 seconds;Limitations    Rockerboard Limitations no UE support needed in any position                  PT Short Term Goals - 01/14/21 1241       PT SHORT TERM GOAL #1   Title Pt will initiate HEP in order to indicate decreased fall risk and improved functional mobility.  Baseline 5x STS; 12/17/20 [id number]    Time 3    Period Weeks    Status Achieved    Target Date 12/28/20      PT SHORT TERM GOAL #2   Title Assess DGI and establish goal    Baseline TBD; 12/19/20 DGI score 23/24    Time 3    Period Weeks    Status Achieved    Target Date 12/28/20      PT SHORT TERM GOAL #3   Title Assess gait velocity and set appropriate goal; Goal is 0.6 m/s    Baseline TBD; 12/17/20 Gait velocity 0.34 m/s    Time 3    Period Weeks    Status Achieved    Target Date 12/28/20      PT SHORT TERM GOAL #4   Title Pt will ambulate up to 500' at independent level with improved posture using LRAD    Baseline 124f in clinic with cane; 12/31/20 Ambulation of 2549fin clinic w/o AD under S; 01/14/21 50013fn clinic with cane    Time 3    Period Weeks    Status Achieved    Target Date 12/28/20               PT Long Term Goals - 01/14/21 1244       PT LONG TERM GOAL #1   Title Patient to ambulate 1000f36fth S and LRAD    Baseline 100ft21fh SPC and S    Time 6    Period Weeks    Status On-going      PT LONG TERM GOAL #2   Title Patient to negotiate 16 steps with most appropriate pattern and support    Baseline TBD; 01/14/21 Able to negotiate 16 step with step through and single rail pattern    Time 6    Period Weeks    Status Achieved      PT LONG TERM GOAL #3   Title patient to comlete 5xSTS in 14s or less as determined by age and gender    Baseline 17.1s w/o UE assist; 01/14/21 5xSTS in 15.0s    Time 6    Period Weeks    Status Partially Met      PT LONG TERM GOAL #4    Title Patient to demo 0.60 m/s gait velocity which would equate to age and gender norms    Baseline TBD; 12/17/20 0.34 m/s; 01/14/21 gait speed 0.78 m/s    Time 6    Period Weeks    Status Achieved      PT LONG TERM GOAL #5   Title Assess progress towards DGI goal    Baseline TBD; 12/19/20 23/24 score, no goal needed    Time 6    Status Achieved                   Plan - 01/14/21 1401     Clinical Impression Statement Todays session focused on goal progress, assessment of hip strength, and continued balance tasks centered around ankle tasks.  Performance on RB showed ability to stand 30s EO/EC w/o balance loss.  PF strength rated as WFL.  B glut medius strength 4/5 but B abduction just 3/5    Personal Factors and Comorbidities Age;Past/Current Experience    Examination-Activity Limitations Transfers;Stairs;Locomotion Level    Examination-Participation Restrictions Yard Work    Stability/Clinical Decision Making Stable/Uncomplicated    Rehab Potential Good    PT Frequency 2x / week  PT Duration 6 weeks    PT Treatment/Interventions ADLs/Self Care Home Management;Aquatic Therapy;DME Instruction;Gait training;Stair training;Functional mobility training;Therapeutic activities;Therapeutic exercise;Balance training;Neuromuscular re-education;Patient/family education    PT Next Visit Plan PF strengthening, ankle proprioception, abduction strenthening    PT Home Exercise Plan 5x STS; [id number]    Consulted and Agree with Plan of Care Patient             Patient will benefit from skilled therapeutic intervention in order to improve the following deficits and impairments:  Abnormal gait, Difficulty walking, Decreased endurance, Decreased activity tolerance, Decreased balance, Decreased mobility, Decreased strength  Visit Diagnosis: Unsteadiness on feet  Repeated falls  Balance disorder     Problem List Patient Active Problem List   Diagnosis Date Noted   SBO (small  bowel obstruction) ([place]) 12/31/2019   Amaurosis fugax of right eye 08/25/2017   PSVT (paroxysmal supraventricular tachycardia) ([place]) 09/12/2015   Chest pain 05/03/2015   Dyspnea 05/03/2015   ALLERGIC RHINITIS 09/21/2007   G E R D 09/21/2007   SMOKE INHALATION 09/21/2007   OSTEOPOROSIS 01/28/2007   PALPITATIONS 01/28/2007   COUGH 01/28/2007   ALLERGY 01/28/2007    Lanice Shirts, PT 01/14/2021, 2:21 PM  [facility] [address] [place], [place], [address] Phone: [phone]   Fax:  [phone]  Name: Lee Nelson MRN: [id number] Date of Birth: [birth year]

## 2021-01-25 ENCOUNTER — Other Ambulatory Visit: Payer: Self-pay

## 2021-01-25 ENCOUNTER — Ambulatory Visit: Payer: Medicare Other

## 2021-01-25 VITALS — BP 102/60

## 2021-01-25 DIAGNOSIS — R2681 Unsteadiness on feet: Secondary | ICD-10-CM | POA: Diagnosis not present

## 2021-01-25 DIAGNOSIS — R2689 Other abnormalities of gait and mobility: Secondary | ICD-10-CM

## 2021-01-25 DIAGNOSIS — R296 Repeated falls: Secondary | ICD-10-CM

## 2021-01-25 NOTE — Therapy (Signed)
[facility] [address] [place], [place], [address] Phone: [phone]   Fax:  [phone]  Physical Therapy Treatment  Patient Details  Name: Lee Nelson MRN: [id number] Date of Birth: 11/10/1940 Referring Provider (PT): Gloriann Loan PA   Encounter Date: 01/25/2021   PT End of Session - 01/25/21 1025     Visit Number 12    Number of Visits 13    Date for PT Re-Evaluation 01/18/21    Authorization Type MCR/MCD    Authorization Time Period 9/2-11/2    Progress Note Due on Visit 10    PT Start Time 0945    PT Stop Time 1030    PT Time Calculation (min) 45 min    Equipment Utilized During Treatment Gait belt    Activity Tolerance Patient tolerated treatment well    Behavior During Therapy [facility] for tasks assessed/performed             Past Medical History:  Diagnosis Date   Anxiety    Arthritis    Bladder calculus    BPH (benign prostatic hyperplasia)    Chronic constipation    Complication of anesthesia    " I had some coughing afterwards for a couple of days"--  per pt "perfers spinal anesthesia since general anesthesia congnitive issues when older"   Diverticulosis of colon    Dry eye syndrome of both eyes    Environmental allergies    GERD (gastroesophageal reflux disease)    occasional   History of adenomatous polyp of colon    08/ 2004   History of kidney stones    History of squamous cell carcinoma in situ (SCCIS) of skin    s/p  excision 2013 facial areas and 06/ 2016 nose   Migraine    eye migraine occasional   Seasonal and perennial allergic rhinitis    Thrombocytopenia (HCC)    Tingling    hands and feet bilat , intermittantly-- per pt has lumbar bulging disk   Urinary frequency    Vocal fold atrophy    dysphonia-- per pt has to drink large amount of water to take even on pill   Wears glasses     Past Surgical History:  Procedure Laterality Date   APPENDECTOMY  child    CARDIOVASCULAR STRESS TEST  05-17-2015  dr hilty   Low risk nuclear study w/ no ischemia/  normal LV function and wall motion , stress ef 54% (lvef 45-54%)   CHOLECYSTECTOMY N/A 09/29/2014   Procedure: LAPAROSCOPIC CHOLECYSTECTOMY WITH INTRAOPERATIVE CHOLANGIOGRAM;  Surgeon: Alphonsa Overall, MD;  Location: [facility];  Service: General;  Laterality: N/A;   COLONOSCOPY  last one 09-06-2010   ESOPHAGOGASTRODUODENOSCOPY N/A 12/09/2013   Procedure: ESOPHAGOGASTRODUODENOSCOPY (EGD);  Surgeon: Arta Silence, MD;  Location[facility];  Service: Endoscopy;  Laterality: N/A;   EXTRACORPOREAL SHOCK WAVE LITHOTRIPSY  yrs ago   INGUINAL HERNIA REPAIR Left child   inguinal hernia repair  40/9735   NISSEN FUNDOPLICATION  3299'M   open   STONE EXTRACTION WITH BASKET N/A 08/18/2016   Procedure: STONE EXTRACTION WITH BASKET;  Surgeon: Carolan Clines, MD;  Location: [facility];  Service: Urology;  Laterality: N/A;   THULIUM LASER TURP (TRANSURETHRAL RESECTION OF PROSTATE) N/A 08/18/2016   Procedure: THULIUM LASER BLADDER NECK INCISION AND BLADDER STONE REMOVAL;  Surgeon: Carolan Clines, MD;  Location: [facility];  Service: Urology;  Laterality: N/A;   TONSILLECTOMY  child   TRANSTHORACIC ECHOCARDIOGRAM  11/18/2010  grade 1 diastolic dysfunction, ef 79-43%/  trivial MR and TR/ mild dilated RA    Vitals:   01/25/21 1018  BP: 102/60     Subjective Assessment - 01/25/21 0946     Subjective Reports continued issues with turning, but denies dizziness or spinning sensation    Pertinent History Patient is a 80 year old male who presents with recent history of multiple falls.  He has had a recent fall about a month ago and buttock and low back pain since that fall.  He has not received his platform walker yet.  Today's radiographs are negative for any identifiable fracture.  However, concerned with patient's increasing instability and troubles with balance.  Only takes 1 fall for  a potentially disastrous injury.  With this in mind, recommended physical therapy routine to optimize his balance and strength.  He is also had good experience at [facility] neurologic with Dr. [name].  Plan to refer him to [facility] neuro for evaluation of worsening balance and multiple falls.  In the meantime, recommended he start working with PT and obtain a platform walker.  Follow-up in 6 weeks for clinical recheck with Dr. Marlou Sa to ensure that his buttock and hip pain are progressively improving or resolved.    Limitations Standing;Walking    How long can you sit comfortably? >30 min    How long can you stand comfortably? <30 min    How long can you walk comfortably? <30 min    Patient Stated Goals to improve my balance    Pain Onset 1 to 4 weeks ago                               [facility] Adult PT Treatment/Exercise - 01/25/21 0001       Transfers   Transfers Sit to Stand;Stand to Sit    Sit to Stand 6: Modified independent (Device/Increase time)    Five time sit to stand comments  12.5    Stand to Sit 6: Modified independent (Device/Increase time)      Ambulation/Gait   Ambulation/Gait Yes    Ambulation/Gait Assistance 6: Modified independent (Device/Increase time)    Ambulation Distance (Feet) 1000 Feet    Assistive device Straight cane;None    Gait Pattern Trunk flexed;Right flexed knee in stance;Left flexed knee in stance    Ambulation Surface Level;Indoor      Lumbar Exercises: Sidelying   Clam Both;10 reps;Limitations    Clam Limitations 4/5 strength B    Hip Abduction Both;10 reps    Hip Abduction Limitations 3+/5 strength B      Knee/Hip Exercises: Aerobic   Nustep L2 seat 9 arms 10 x8'                 Balance Exercises - 01/25/21 0001       Balance Exercises: Standing   Tandem Stance Eyes open;Upper extremity support 1;1 rep;30 secs;Limitations    Tandem Stance Time both positions                  PT Short Term Goals -  01/14/21 1241       PT SHORT TERM GOAL #1   Title Pt will initiate HEP in order to indicate decreased fall risk and improved functional mobility.    Baseline 5x STS; 12/17/20 [id number]    Time 3    Period Weeks    Status Achieved    Target Date 12/28/20  PT SHORT TERM GOAL #2   Title Assess DGI and establish goal    Baseline TBD; 12/19/20 DGI score 23/24    Time 3    Period Weeks    Status Achieved    Target Date 12/28/20      PT SHORT TERM GOAL #3   Title Assess gait velocity and set appropriate goal; Goal is 0.6 m/s    Baseline TBD; 12/17/20 Gait velocity 0.34 m/s    Time 3    Period Weeks    Status Achieved    Target Date 12/28/20      PT SHORT TERM GOAL #4   Title Pt will ambulate up to 500' at independent level with improved posture using LRAD    Baseline 170f in clinic with cane; 12/31/20 Ambulation of 2545fin clinic w/o AD under S; 01/14/21 50061fn clinic with cane    Time 3    Period Weeks    Status Achieved    Target Date 12/28/20               PT Long Term Goals - 01/25/21 1008       PT LONG TERM GOAL #1   Title Patient to ambulate 1000f23fth S and LRAD    Baseline 100ft73fh SPC and S; 01/25/21 1000ft 63f cane and S    Time 6    Period Weeks    Status Partially Met      PT LONG TERM GOAL #2   Title Patient to negotiate 16 steps with most appropriate pattern and support    Baseline TBD; 01/14/21 Able to negotiate 16 step with step through and single rail pattern    Time 6    Period Weeks    Status Partially Met      PT LONG TERM GOAL #3   Title patient to comlete 5xSTS in 14s or less as determined by age and gender    Baseline 17.1s w/o UE assist; 01/14/21 5xSTS in 15.0s; 5x STS in 12.5s    Time 6    Period Weeks    Status Achieved      PT LONG TERM GOAL #4   Title Patient to demo 0.60 m/s gait velocity which would equate to age and gender norms    Baseline TBD; 12/17/20 0.34 m/s; 01/14/21 gait speed 0.78 m/s    Time 6    Period Weeks     Status Achieved      PT LONG TERM GOAL #5   Title Assess progress towards DGI goal    Baseline TBD; 12/19/20 23/24 score, no goal needed    Time 6    Status Achieved                   Plan - 01/25/21 1032     Clinical Impression Statement Todays session focused on goal progress and HEP update.  Adjusted HEP to address strength and balance deficits.  BP was slightly lowered possibly accounting for episodes of lightheadedness.  Patient able to ambulate 1000ft w8fS and cane.  No challenge when standing on foam surface but balance deficits noted with static tandem stance of 30s duration.  One more session remaining with anticipated DC to HEP    Personal Factors and Comorbidities Age;Past/Current Experience    Examination-Activity Limitations Transfers;Stairs;Locomotion Level    Examination-Participation Restrictions Yard Work    Stability/Clinical Decision Making Stable/Uncomplicated    Clinical Decision Making Low    Rehab Potential Good  PT Frequency 2x / week    PT Duration 6 weeks    PT Treatment/Interventions ADLs/Self Care Home Management;Aquatic Therapy;DME Instruction;Gait training;Stair training;Functional mobility training;Therapeutic activities;Therapeutic exercise;Balance training;Neuromuscular re-education;Patient/family education    PT Next Visit Goodnight             Patient will benefit from skilled therapeutic intervention in order to improve the following deficits and impairments:  Abnormal gait, Difficulty walking, Decreased endurance, Decreased activity tolerance, Decreased balance, Decreased mobility, Decreased strength  Visit Diagnosis: Unsteadiness on feet  Repeated falls  Balance disorder     Problem List Patient Active Problem List   Diagnosis Date Noted   SBO (small bowel obstruction) ([place]) 12/31/2019   Amaurosis fugax of right eye 08/25/2017   PSVT (paroxysmal supraventricular tachycardia)  ([place]) 09/12/2015   Chest pain 05/03/2015   Dyspnea 05/03/2015   ALLERGIC RHINITIS 09/21/2007   G E R D 09/21/2007   SMOKE INHALATION 09/21/2007   OSTEOPOROSIS 01/28/2007   PALPITATIONS 01/28/2007   COUGH 01/28/2007   ALLERGY 01/28/2007    Lanice Shirts, PT 01/25/2021, 10:47 AM  [facility] [address] [place], [place], [address] Phone: [phone]   Fax:  [phone]  Name: Lee Nelson MRN: [id number] Date of Birth: 09/04/1940

## 2021-01-25 NOTE — Patient Instructions (Signed)
Access Code: [email or web address] URL: [email or web address] Date: 01/25/2021 Prepared by: Gustavus Bryant  Exercises Clamshell - 2 x daily - 7 x weekly - 2 sets - 15 reps Backward Tandem Walking with Counter Support - 2 x daily - 7 x weekly - 2 sets - 5 reps Tandem Stance with Support - 2 x daily - 7 x weekly - 2 sets - 1 reps - 30s hold Sidelying Hip Abduction - 2 x daily - 7 x weekly - 2 sets - 15 reps

## 2021-01-28 ENCOUNTER — Telehealth (HOSPITAL_BASED_OUTPATIENT_CLINIC_OR_DEPARTMENT_OTHER): Payer: Self-pay | Admitting: Internal Medicine

## 2021-01-28 NOTE — Telephone Encounter (Signed)
Left message for patient to call and discuss appointment with Dr. Suszanne Conners as ordered by Dr. Rennis Golden

## 2021-01-29 NOTE — Telephone Encounter (Signed)
Spoke with patient this morning regarding his upcoming appointment with Dr. Suszanne Conners on 02/19/21.  Patient is wondering if Dr Rennis Golden can recommend someone else that may can see him sooner

## 2021-01-30 ENCOUNTER — Other Ambulatory Visit: Payer: Self-pay

## 2021-01-30 ENCOUNTER — Ambulatory Visit: Payer: Medicare Other | Attending: Surgical

## 2021-01-30 VITALS — BP 112/62

## 2021-01-30 DIAGNOSIS — R2681 Unsteadiness on feet: Secondary | ICD-10-CM | POA: Insufficient documentation

## 2021-01-30 DIAGNOSIS — R2689 Other abnormalities of gait and mobility: Secondary | ICD-10-CM | POA: Diagnosis present

## 2021-01-30 DIAGNOSIS — R296 Repeated falls: Secondary | ICD-10-CM | POA: Diagnosis present

## 2021-01-30 NOTE — Telephone Encounter (Signed)
Routed to MD for recommendations for other ENT. Not sure of lead time on appointments for other providers

## 2021-01-30 NOTE — Telephone Encounter (Signed)
Consider Dr. Ezzard Standing or other ENT's in the area - not sure what the wait times will be there either.  Dr Rexene Edison

## 2021-01-30 NOTE — Therapy (Signed)
[facility] [address] [place], [place], [address] Phone: [phone]   Fax:  [phone]  Physical Therapy Treatment/DC Summary  Patient Details  Name: Lee Nelson MRN: [id number] Date of Birth: [birth year] Referring Provider (PT): Gloriann Loan PA   Encounter Date: 01/30/2021 PHYSICAL THERAPY DISCHARGE SUMMARY  Visits from Start of Care: 13  Current functional level related to goals / functional outcomes: Rehab goals met   Remaining deficits: Dizziness   Education / Equipment: HEP   Patient agrees to discharge. Patient goals were met. Patient is being discharged due to meeting the stated rehab goals.    PT End of Session - 01/30/21 1709     Visit Number 13    Number of Visits 13    Date for PT Re-Evaluation 01/18/21    Authorization Type MCR/MCD    Authorization Time Period 9/2-11/2    Progress Note Due on Visit 10    PT Start Time 1700    PT Stop Time 1745    PT Time Calculation (min) 45 min    Equipment Utilized During Treatment Gait belt    Activity Tolerance Patient tolerated treatment well    Behavior During Therapy WFL for tasks assessed/performed             Past Medical History:  Diagnosis Date   Anxiety    Arthritis    Bladder calculus    BPH (benign prostatic hyperplasia)    Chronic constipation    Complication of anesthesia    " I had some coughing afterwards for a couple of days"--  per pt "perfers spinal anesthesia since general anesthesia congnitive issues when older"   Diverticulosis of colon    Dry eye syndrome of both eyes    Environmental allergies    GERD (gastroesophageal reflux disease)    occasional   History of adenomatous polyp of colon    08/ 2004   History of kidney stones    History of squamous cell carcinoma in situ (SCCIS) of skin    s/p  excision 2013 facial areas and 06/ 2016 nose   Migraine    eye migraine occasional   Seasonal and perennial  allergic rhinitis    Thrombocytopenia (HCC)    Tingling    hands and feet bilat , intermittantly-- per pt has lumbar bulging disk   Urinary frequency    Vocal fold atrophy    dysphonia-- per pt has to drink large amount of water to take even on pill   Wears glasses     Past Surgical History:  Procedure Laterality Date   APPENDECTOMY  child   CARDIOVASCULAR STRESS TEST  05-17-2015  dr hilty   Low risk nuclear study w/ no ischemia/  normal LV function and wall motion , stress ef 54% (lvef 45-54%)   CHOLECYSTECTOMY N/A 09/29/2014   Procedure: LAPAROSCOPIC CHOLECYSTECTOMY WITH INTRAOPERATIVE CHOLANGIOGRAM;  Surgeon: Alphonsa Overall, MD;  Location: [facility];  Service: General;  Laterality: N/A;   COLONOSCOPY  last one 09-06-2010   ESOPHAGOGASTRODUODENOSCOPY N/A 12/09/2013   Procedure: ESOPHAGOGASTRODUODENOSCOPY (EGD);  Surgeon: Arta Silence, MD;  Location: [facility];  Service: Endoscopy;  Laterality: N/A;   EXTRACORPOREAL SHOCK WAVE LITHOTRIPSY  yrs ago   INGUINAL HERNIA REPAIR Left child   inguinal hernia repair  74/0814   NISSEN FUNDOPLICATION  4818'H   open   STONE EXTRACTION WITH BASKET N/A 08/18/2016   Procedure: STONE EXTRACTION WITH BASKET;  Surgeon: Carolan Clines, MD;  Location: [facility];  Service: Urology;  Laterality: N/A;   THULIUM LASER TURP (TRANSURETHRAL RESECTION OF PROSTATE) N/A 08/18/2016   Procedure: THULIUM LASER BLADDER NECK INCISION AND BLADDER STONE REMOVAL;  Surgeon: Carolan Clines, MD;  Location: [facility];  Service: Urology;  Laterality: N/A;   TONSILLECTOMY  child   TRANSTHORACIC ECHOCARDIOGRAM  11/18/2010   grade 1 diastolic dysfunction, ef 33-29%/  trivial MR and TR/ mild dilated RA    Vitals:   01/30/21 1727  BP: 112/62     Subjective Assessment - 01/30/21 1707     Subjective Continues to report issues with dizziness.  Will see Dr. Benjamine Mola on 02/22/21    Limitations Standing;Walking    How long can you sit  comfortably? >30 min    How long can you stand comfortably? <30 min    How long can you walk comfortably? <30 min    Patient Stated Goals to improve my balance    Pain Onset 1 to 4 weeks ago                [facility] PT Assessment - 01/30/21 0001       Functional Gait  Assessment   Gait assessed  Yes    Gait Level Surface Walks 20 ft, slow speed, abnormal gait pattern, evidence for imbalance or deviates 10-15 in outside of the 12 in walkway width. Requires more than 7 sec to ambulate 20 ft.    Change in Gait Speed Able to smoothly change walking speed without loss of balance or gait deviation. Deviate no more than 6 in outside of the 12 in walkway width.    Gait with Horizontal Head Turns Performs head turns smoothly with no change in gait. Deviates no more than 6 in outside 12 in walkway width    Gait with Vertical Head Turns Performs head turns with no change in gait. Deviates no more than 6 in outside 12 in walkway width.    Gait and Pivot Turn Pivot turns safely within 3 sec and stops quickly with no loss of balance.    Step Over Obstacle Is able to step over 2 stacked shoe boxes taped together (9 in total height) without changing gait speed. No evidence of imbalance.    Gait with Narrow Base of Support Ambulates 7-9 steps.    Gait with Eyes Closed Walks 20 ft, uses assistive device, slower speed, mild gait deviations, deviates 6-10 in outside 12 in walkway width. Ambulates 20 ft in less than 9 sec but greater than 7 sec.    Ambulating Backwards Walks 20 ft, no assistive devices, good speed, no evidence for imbalance, normal gait    Steps Alternating feet, must use rail.    Total Score 25                                      PT Short Term Goals - 01/30/21 1709       PT SHORT TERM GOAL #1   Title Pt will initiate HEP in order to indicate decreased fall risk and improved functional mobility.    Baseline 5x STS; 12/17/20 [id number]    Time 3    Period Weeks     Status Achieved    Target Date 12/28/20      PT SHORT TERM GOAL #2   Title Assess DGI and establish goal    Baseline TBD; 12/19/20 DGI score 23/24  Time 3    Period Weeks    Status Achieved    Target Date 12/28/20      PT SHORT TERM GOAL #3   Title Assess gait velocity and set appropriate goal; Goal is 0.6 m/s    Baseline TBD; 12/17/20 Gait velocity 0.34 m/s    Time 3    Period Weeks    Status Achieved    Target Date 12/28/20      PT SHORT TERM GOAL #4   Title Pt will ambulate up to 500' at independent level with improved posture using LRAD    Baseline 177f in clinic with cane; 12/31/20 Ambulation of 2542fin clinic w/o AD under S; 01/14/21 50044fn clinic with cane    Time 3    Period Weeks    Status Achieved    Target Date 12/28/20               PT Long Term Goals - 01/30/21 1711       PT LONG TERM GOAL #1   Title Patient to ambulate [id number] S and LRAD    Baseline 100ft9fh SPC and S; 01/25/21 1000ft 1f cane and S    Time 6    Period Weeks    Status Achieved      PT LONG TERM GOAL #2   Title Patient to negotiate 16 steps with most appropriate pattern and support    Baseline TBD; 01/14/21 Able to negotiate 16 step with step through and single rail pattern    Time 6    Period Weeks    Status Achieved      PT LONG TERM GOAL #3   Title patient to comlete 5xSTS in 14s or less as determined by age and gender    Baseline 17.1s w/o UE assist; 01/14/21 5xSTS in 15.0s; 5x STS in 12.5s    Time 6    Period Weeks    Status Achieved      PT LONG TERM GOAL #4   Title Patient to demo 0.60 m/s gait velocity which would equate to age and gender norms    Baseline TBD; 12/17/20 0.34 m/s; 01/14/21 gait speed 0.78 m/s    Time 6    Period Weeks    Status Achieved      PT LONG TERM GOAL #5   Title Assess progress towards DGI goal    Baseline TBD; 12/19/20 23/24 score, no goal needed    Time 6    Status Achieved                   Plan - 01/30/21  1746     Clinical Impression Statement Todays session addressed progress towards goals, assessed FGA and reviewed HEP.  FGA score was 25/30 above the 22 point cutoff for fall risk.  All goals have been met.  patient has an appointmment with Dr. Teoh, Benjamine Molato address issues on 02/22/21    Personal Factors and Comorbidities Age;Past/Current Experience    Examination-Activity Limitations Transfers;Stairs;Locomotion Level    Examination-Participation Restrictions Yard Work    Stability/Clinical Decision Making Stable/Uncomplicated    Rehab Potential Good    PT Frequency 2x / week    PT Duration 6 weeks    PT Treatment/Interventions ADLs/Self Care Home Management;Aquatic Therapy;DME Instruction;Gait training;Stair training;Functional mobility training;Therapeutic activities;Therapeutic exercise;Balance training;Neuromuscular re-education;Patient/family education    PT Next Visit Plan DC to HEP   [place]  Patient will benefit from skilled therapeutic intervention in order to improve the following deficits and impairments:  Abnormal gait, Difficulty walking, Decreased endurance, Decreased activity tolerance, Decreased balance, Decreased mobility, Decreased strength  Visit Diagnosis: Unsteadiness on feet  Repeated falls  Balance disorder     Problem List Patient Active Problem List   Diagnosis Date Noted   SBO (small bowel obstruction) ([place]) 12/31/2019   Amaurosis fugax of right eye 08/25/2017   PSVT (paroxysmal supraventricular tachycardia) ([place]) 09/12/2015   Chest pain 05/03/2015   Dyspnea 05/03/2015   ALLERGIC RHINITIS 09/21/2007   G E R D 09/21/2007   SMOKE INHALATION 09/21/2007   OSTEOPOROSIS 01/28/2007   PALPITATIONS 01/28/2007   COUGH 01/28/2007   ALLERGY 01/28/2007    Lanice Shirts, PT 01/30/2021, 5:50 PM  [facility] [address] [place], [place], [address] Phone:  [phone]   Fax:  [phone]  Name: Lee Nelson MRN: [id number] Date of Birth: 07/10/1940

## 2021-01-30 NOTE — Telephone Encounter (Signed)
Spoke with patient about ENT referral. Explained that we do not know lead times on new patient appts at other specialty practices, unfortunately. Advised him we can refer to a different provider, if he would like. He will keep visit as scheduled for now

## 2021-02-01 ENCOUNTER — Ambulatory Visit: Payer: Medicare Other

## 2021-03-08 ENCOUNTER — Ambulatory Visit (INDEPENDENT_AMBULATORY_CARE_PROVIDER_SITE_OTHER): Payer: Medicare Other

## 2021-03-08 ENCOUNTER — Encounter: Payer: Self-pay | Admitting: Surgical

## 2021-03-08 ENCOUNTER — Other Ambulatory Visit: Payer: Self-pay

## 2021-03-08 ENCOUNTER — Ambulatory Visit: Payer: Medicare Other | Admitting: Surgical

## 2021-03-08 ENCOUNTER — Ambulatory Visit (INDEPENDENT_AMBULATORY_CARE_PROVIDER_SITE_OTHER): Payer: Medicare Other | Admitting: Surgical

## 2021-03-08 DIAGNOSIS — M79671 Pain in right foot: Secondary | ICD-10-CM

## 2021-03-08 DIAGNOSIS — M79641 Pain in right hand: Secondary | ICD-10-CM

## 2021-03-14 NOTE — Patient Instructions (Incomplete)

## 2021-03-14 NOTE — Progress Notes (Deleted)
No chief complaint on file.    HISTORY OF PRESENT ILLNESS:  03/14/21 ALL:  Lee Nelson is a 80 y.o. male here today for follow up for MCI.    HISTORY (copied from [place]'s previous note)  UPDATE (12/11/20, VRP): Since last visit, doing poorly. Continues with memory loss and balance issues. Poor sleep. More stress. Has good insight into his issues.   UPDATE (06/04/20, VRP): Since last visit, doing poorly. Continues with stress and mild memory issues and depression. Has feelings of regret towards prior financial decisions and health care for his wife.    UPDATE (05/03/19, VRP): Since last visit, doing slightly worse, more memory loss, stress, gait difficulty. No alleviating or aggravating factors. More stress related to wife's health, cargiving expenses, having to retire from Teacher, adult education.      UPDATE (06/01/18, VRP): Since last visit, noting more memory issues. Has missed a meeting. Some words are jumbled. Symptoms are progressive. Insomnia and anxiety have worsened.    UPDATE (11/09/17, VRP): Since last visit, doing about the same. Symptoms are stable. Severity is mild. No alleviating or aggravating factors. Tolerating supplements. PT exercises helped, but now recovering from hernia 10/16/17. Also had transient right eye vision changes in May 2019 (shade over the right eye) x 1 minute. No recurrence.   UPDATE (05/07/17, VRP): Since last visit, doing about the same. Tolerating meds. No alleviating or aggravating factors. Has had more falls and balance issues. More memory loss issues.    UPDATE 12/30/16: Since last visit, continues with word finding diff. Also with intermittent right sided headaches, intermittent double vision, and neck pain. Avg 1-2 days per week of headache.    UPDATE 03/10/14: Since last visit, no more double vision attacks. Notes some work finding diff. Still with stress from his wife's quadriplegia and 24 hour care at home.    PRIOR HPI (09/07/13): 80 year old  right-handed male with history of neck pain, back pain, skin cancer, reflux disease, osteoporosis, anxiety, fibromyalgia, migraine, here for evaluation of headaches and transient double vision. Previous evaluated patient in 2012 for right arm numbness. Patient reports over ten-year history of intermittent neck pain. Patient has some degenerative cervical spine disease managed conservatively. Patient has one to 2 year history of intermittent headaches mainly in the right parietal and right occipital region. Some pain radiates from the neck up to the head. No nausea or vomiting. No photophobia or phonophobia. Sometimes he has sharp shooting brief pains. He rates pain severity 2-3/10. More recently headache symptoms have been more constant. No visual symptoms associated with headache. Separately patient has intermittent visual disturbance where he sees "swimming lines" in front of him, but these are not associated with headache. 10 days ago patient was at a meeting talking to some people, when he had sudden onset of vertical double vision lasting for one to 2 minutes. He had mild queasiness sensation. No vomiting. He had mild equilibrium problem without falling out of his chair. No slurred speech. No numbness or tingling. Symptoms resolved on their own. No headaches associated with this.     REVIEW OF SYSTEMS: Out of a complete 14 system review of symptoms, the patient complains only of the following symptoms, and all other reviewed systems are negative.   ALLERGIES: Allergies  Allergen Reactions   Ciprofloxacin     JOINT PAIN   Flagyl [Metronidazole] Other (See Comments)    REACTION: no appetite, diarrhea after meal, decrease in weight   Metoclopramide Hcl Other (See Comments)  REACTION: "involuntary movements"   Other Other (See Comments)    Antibiotics have unknown reaction propophol causes memory problems   Propofol Other (See Comments)   Risperidone Other (See Comments)    "do not want"    Septra [Sulfamethoxazole-Trimethoprim] Other (See Comments)    REACTION: "involuntary movements" tripac antibiotic- heart rythm   Silodosin     ? Possibly allergy, could not breathe well out of nose   Soy Allergy Other (See Comments)    Stomach upset, "gas"     HOME MEDICATIONS: Outpatient Medications Prior to Visit  Medication Sig Dispense Refill   acetaminophen-codeine (TYLENOL #3) 300-30 MG tablet 1 po q 8 prn pain 30 tablet 0   Acetylcysteine (NAC) 500 MG CAPS Take by mouth.     B Complex Vitamins (VITAMIN B COMPLEX) TABS 1 tab     Cholecalciferol (VITAMIN D-3) 1000 units CAPS Take 2,000 Units by mouth daily.      Cranberry 400 MG CAPS Take 400-1,200 mg by mouth every morning.      docusate sodium (COLACE) 50 MG capsule Take 1 capsule (50 mg total) by mouth at bedtime. 10 capsule 0   MAGNESIUM CITRATE PO Take 75 mg by mouth daily.     Menaquinone-7 (VITAMIN K2 PO) Take 120-240 mg by mouth daily. Combo w/ Vit. D     NON FORMULARY Take 1 capsule by mouth daily. Acetyl L carnitine     OVER THE COUNTER MEDICATION Take 3 capsules by mouth 2 (two) times daily. Lion's mane 0.5 gram/capsule     OVER THE COUNTER MEDICATION Take 2 capsules by mouth every morning. Reparagen supplement     OVER THE COUNTER MEDICATION Melatonin patch     Phosphatidylserine 100 MG CAPS Take 100 mg by mouth every morning.      Probiotic Product (PROBIOTIC DAILY PO) Take 1 capsule by mouth daily.      Propylene Glycol (SYSTANE BALANCE) 0.6 % SOLN Apply 1 drop to eye daily as needed (dry eyes).      sodium chloride (MURO 128) 5 % ophthalmic ointment Place 1 application into both eyes at bedtime.      sodium chloride (MURO 128) 5 % ophthalmic solution See admin instructions.     tadalafil (CIALIS) 5 MG tablet Take by mouth.     THEANINE PO Take 2 tablets by mouth 2 (two) times daily.      THIAMINE HCL PO Take by mouth.     UNABLE TO FIND Med Name: cocovia memory     vitamin C (ASCORBIC ACID) 250 MG tablet Take  250 mg by mouth daily.     No facility-administered medications prior to visit.     PAST MEDICAL HISTORY: Past Medical History:  Diagnosis Date   Anxiety    Arthritis    Bladder calculus    BPH (benign prostatic hyperplasia)    Chronic constipation    Complication of anesthesia    " I had some coughing afterwards for a couple of days"--  per pt "perfers spinal anesthesia since general anesthesia congnitive issues when older"   Diverticulosis of colon    Dry eye syndrome of both eyes    Environmental allergies    GERD (gastroesophageal reflux disease)    occasional   History of adenomatous polyp of colon    08/ 2004   History of kidney stones    History of squamous cell carcinoma in situ (SCCIS) of skin    s/p  excision 2013 facial areas and 06/  2016 nose   Migraine    eye migraine occasional   Seasonal and perennial allergic rhinitis    Thrombocytopenia (HCC)    Tingling    hands and feet bilat , intermittantly-- per pt has lumbar bulging disk   Urinary frequency    Vocal fold atrophy    dysphonia-- per pt has to drink large amount of water to take even on pill   Wears glasses      PAST SURGICAL HISTORY: Past Surgical History:  Procedure Laterality Date   APPENDECTOMY  child   CARDIOVASCULAR STRESS TEST  05-17-2015  dr hilty   Low risk nuclear study w/ no ischemia/  normal LV function and wall motion , stress ef 54% (lvef 45-54%)   CHOLECYSTECTOMY N/A 09/29/2014   Procedure: LAPAROSCOPIC CHOLECYSTECTOMY WITH INTRAOPERATIVE CHOLANGIOGRAM;  Surgeon: Alphonsa Overall, MD;  Location: [facility];  Service: General;  Laterality: N/A;   COLONOSCOPY  last one 09-06-2010   ESOPHAGOGASTRODUODENOSCOPY N/A 12/09/2013   Procedure: ESOPHAGOGASTRODUODENOSCOPY (EGD);  Surgeon: Arta Silence, MD;  [facility];  Service: Endoscopy;  Laterality: N/A;   EXTRACORPOREAL SHOCK WAVE LITHOTRIPSY  yrs ago   INGUINAL HERNIA REPAIR Left child   inguinal hernia repair  09/2017   NISSEN  FUNDOPLICATION  [id number]   open   STONE EXTRACTION WITH BASKET N/A 08/18/2016   Procedure: STONE EXTRACTION WITH BASKET;  Surgeon: Carolan Clines, MD;  Location: [facility];  Service: Urology;  Laterality: N/A;   THULIUM LASER TURP (TRANSURETHRAL RESECTION OF PROSTATE) N/A 08/18/2016   Procedure: THULIUM LASER BLADDER NECK INCISION AND BLADDER STONE REMOVAL;  Surgeon: Carolan Clines, MD;  Location: [facility];  Service: Urology;  Laterality: N/A;   TONSILLECTOMY  child   TRANSTHORACIC ECHOCARDIOGRAM  11/18/2010   grade 1 diastolic dysfunction, ef [id number]  trivial MR and TR/ mild dilated RA     FAMILY HISTORY: Family History  Problem Relation Age of Onset   Parkinsonism Brother    COPD Mother    Allergies Mother    Heart failure Mother    COPD Father    Stroke Father    Other Sister    Suicidality Maternal Aunt    Cancer Maternal Grandfather      SOCIAL HISTORY: Social History   Socioeconomic History   Marital status: Married    Spouse name: Neoma Laming   Number of children: 1   Years of education: BA, MA, JD   Highest education level: Not on file  Occupational History   Occupation: teaches constitutional law   Occupation: Designer, jewellery: [employer]  Tobacco Use   Smoking status: Never   Smokeless tobacco: Never  Substance and Sexual Activity   Alcohol use: Yes    Comment: social   Drug use: No   Sexual activity: Not on file  Other Topics Concern   Not on file  Social History Narrative   Patient lives at home wife.   Daily caffeine use   Social Determinants of Health   Financial Resource Strain: Not on file  Food Insecurity: Not on file  Transportation Needs: Not on file  Physical Activity: Not on file  Stress: Not on file  Social Connections: Not on file  Intimate Partner Violence: Not on file     PHYSICAL EXAM  There were no vitals filed for this visit. There is no height or weight on file to  calculate BMI.  Generalized: Well developed, in no acute distress  Cardiology: normal rate  and rhythm, no murmur auscultated  Respiratory: clear to auscultation bilaterally    Neurological examination  Mentation: Alert oriented to time, place, history taking. Follows all commands speech and language fluent Cranial nerve II-XII: Pupils were equal round reactive to light. Extraocular movements were full, visual field were full on confrontational test. Facial sensation and strength were normal. Uvula tongue midline. Head turning and shoulder shrug  were normal and symmetric. Motor: The motor testing reveals 5 over 5 strength of all 4 extremities. Good symmetric motor tone is noted throughout.  Sensory: Sensory testing is intact to soft touch on all 4 extremities. No evidence of extinction is noted.  Coordination: Cerebellar testing reveals good finger-nose-finger and heel-to-shin bilaterally.  Gait and station: Gait is normal. Tandem gait is normal. Romberg is negative. No drift is seen.  Reflexes: Deep tendon reflexes are symmetric and normal bilaterally.    DIAGNOSTIC DATA (LABS, IMAGING, TESTING) - I reviewed patient records, labs, notes, testing and imaging myself where available.  Lab Results  Component Value Date   WBC 5.3 01/04/2020   HGB 12.1 (L) 01/04/2020   HCT 36.7 (L) 01/04/2020   MCV 100.3 (H) 01/04/2020   PLT 120 (L) 01/04/2020      Component Value Date/Time   NA 141 01/04/2020 0327   K 3.7 01/04/2020 0327   CL 107 01/04/2020 0327   CO2 26 01/04/2020 0327   GLUCOSE 97 01/04/2020 0327   BUN 9 01/04/2020 0327   CREATININE 0.62 01/04/2020 0327   CALCIUM 8.3 (L) 01/04/2020 0327   PROT 5.4 (L) 01/01/2020 0253   ALBUMIN 2.9 (L) 01/04/2020 0327   AST 19 01/01/2020 0253   ALT 16 01/01/2020 0253   ALKPHOS 57 01/01/2020 0253   BILITOT 0.8 01/01/2020 0253   GFRNONAA >60 01/04/2020 0327   GFRAA >60 01/03/2020 0255   No results found for: CHOL, HDL, LDLCALC, LDLDIRECT,  TRIG, CHOLHDL No results found for: VELF8B Lab Results  Component Value Date   VITAMINB12 711 05/08/2017   No results found for: TSH  MMSE - Mini Mental State Exam 12/11/2020 06/04/2020 05/03/2019  Orientation to time 5 5 5   Orientation to Place 5 5 5   Registration 3 3 3   Attention/ Calculation 4 2 5   Recall 3 3 3   Language- name 2 objects 2 2 2   Language- repeat 1 1 1   Language- follow 3 step command 3 3 3   Language- read & follow direction 1 1 1   Write a sentence 1 1 1   Copy design 1 1 1   Total score 29 27 30      No flowsheet data found.   ASSESSMENT AND PLAN  80 y.o. year old male  has a past medical history of Anxiety, Arthritis, Bladder calculus, BPH (benign prostatic hyperplasia), Chronic constipation, Complication of anesthesia, Diverticulosis of colon, Dry eye syndrome of both eyes, Environmental allergies, GERD (gastroesophageal reflux disease), History of adenomatous polyp of colon, History of kidney stones, History of squamous cell carcinoma in situ (SCCIS) of skin, Migraine, Seasonal and perennial allergic rhinitis, Thrombocytopenia (HCC), Tingling, Urinary frequency, Vocal fold atrophy, and Wears glasses. here with    No diagnosis found.   No orders of the defined types were placed in this encounter.    No orders of the defined types were placed in this encounter.     , MSN, FNP-C 03/14/2021, 11:49 AM  [facility] [address], [address] [place],  [phone]

## 2021-03-17 ENCOUNTER — Encounter: Payer: Self-pay | Admitting: Surgical

## 2021-03-17 NOTE — Progress Notes (Signed)
Office Visit Note   Patient: Lee Nelson           Date of Birth: 09/16/1940           MRN: [id number] Visit Date: 03/08/2021 Requested by: Prince Solian, MD [address] [place],  [place] [address] PCP: Prince Solian, MD  Subjective: Chief Complaint  Patient presents with   Right Foot - Pain   Right Hand - Pain    HPI: Lee Nelson is a 80 y.o. male who presents to the office complaining of right foot and right hand pain.  Patient states he has multiple falls which is typical for him due to his decreased balance.  He denies any increase in pain specifically after any fall but cannot actually rule this out.  Regarding his right foot, he localizes most of his pain to the dorsum of the midfoot.  He notices it more in the last month with increased walking.  No swelling, ecchymosis noted over the last month.  Denies any ankle pain, calf pain, radicular pain down his leg.  Occasional tingling in the foot but nothing consistent.  Regarding the right hand, he is right-hand dominant.  He complains of increased pain with activities such as gripping.  He has noticed a catching sensation in the right ring finger at the base of the finger in the palm.  He also notes a burning pain at the base of the hand in the palm with occasional traveling of this burning pain into some of the fingertips.  Denies any history of surgery to the hand.  He wears a glove at night to help prevent the catching sensation.  He has had trigger finger in the past with 1 prior injection but he cannot remember which finger was injected..                ROS: All systems reviewed are negative as they relate to the chief complaint within the history of present illness.  Patient denies fevers or chills.  Assessment & Plan: Visit Diagnoses:  1. Pain in right foot   2. Pain in right hand     Plan: Patient is an 80 year old male who presents for evaluation of right foot and right hand pain.  He has a history of  multiple falls due to difficulty with his balance but denies any acute increase in pain following any recent fall in regards to his right hand and right foot.  Right hand pain seems to be primarily a combination of right ring trigger finger and possible carpal tunnel syndrome.  Discussed the pathophysiology behind these conditions.  Discussed the potential treatments.  After discussion of options, patient would like to try topical Voltaren over the A1 pulley of the right ring finger as this is the most bothersome for him.  Discussed the option of obtaining nerve study of the right upper extremity for evaluation of carpal tunnel syndrome but patient would not want to proceed with surgery either way so no real point in obtaining the study as it would not really change the management.  Recommended he try a splint at night to see if this will help with symptoms.  If the symptoms become more unbearable, would recommend he return for nerve conduction study but as of now the palm numbness/burning pain is not really giving him as much difficulty as the trigger finger.  Also took x-rays of right hand and right foot which showed no evidence of fracture or dislocation.  Does show he  has some mild midfoot arthritis in the right foot that seems to be primarily the cause for his right foot pain based on exam today.  Recommended he try Voltaren on the dorsum of the midfoot as well.  Follow-up in 4 weeks for clinical recheck with Dr. Marlou Sa primarily with the trigger finger.  Consider injection at that time if Voltaren provided no relief but patient wanted to forego injection at this visit.  Follow-Up Instructions: No follow-ups on file.   Orders:  Orders Placed This Encounter  Procedures   XR Foot Complete Right   XR Hand Complete Right   No orders of the defined types were placed in this encounter.     Procedures: No procedures performed   Clinical Data: No additional findings.  Objective: Vital Signs: There  were no vitals taken for this visit.  Physical Exam:  Constitutional: Patient appears well-developed HEENT:  Head: Normocephalic Eyes:EOM are normal Neck: Normal range of motion Cardiovascular: Normal rate Pulmonary/chest: Effort normal Neurologic: Patient is alert Skin: Skin is warm Psychiatric: Patient has normal mood and affect  Ortho Exam: Ortho exam demonstrates tenderness over the A1 pulley of the right ring finger.  Triggering noted with passive motion of the digit and with observation of active motion of his fingers.  No tenderness over the A1 pulleys of the other fingers.  No swelling or ecchymosis noted.  Positive Tinel sign over the carpal tunnel of the right hand.  No muscle wasting noted. Tenderness over the dorsum of the midfoot.  No tenderness over the fifth metatarsal base, Lisfranc complex, medial malleolus, lateral malleolus, plantar fascia, forefoot.  Specialty Comments:  No specialty comments available.  Imaging: No results found.   PMFS History: Patient Active Problem List   Diagnosis Date Noted   SBO (small bowel obstruction) ([place]) 12/31/2019   Amaurosis fugax of right eye 08/25/2017   PSVT (paroxysmal supraventricular tachycardia) ([place]) 09/12/2015   Chest pain 05/03/2015   Dyspnea 05/03/2015   ALLERGIC RHINITIS 09/21/2007   G E R D 09/21/2007   SMOKE INHALATION 09/21/2007   OSTEOPOROSIS 01/28/2007   PALPITATIONS 01/28/2007   COUGH 01/28/2007   ALLERGY 01/28/2007   Past Medical History:  Diagnosis Date   Anxiety    Arthritis    Bladder calculus    BPH (benign prostatic hyperplasia)    Chronic constipation    Complication of anesthesia    " I had some coughing afterwards for a couple of days"--  per pt "perfers spinal anesthesia since general anesthesia congnitive issues when older"   Diverticulosis of colon    Dry eye syndrome of both eyes    Environmental allergies    GERD (gastroesophageal reflux disease)    occasional   History of  adenomatous polyp of colon    08/ 2004   History of kidney stones    History of squamous cell carcinoma in situ (SCCIS) of skin    s/p  excision 2013 facial areas and 06/ 2016 nose   Migraine    eye migraine occasional   Seasonal and perennial allergic rhinitis    Thrombocytopenia (HCC)    Tingling    hands and feet bilat , intermittantly-- per pt has lumbar bulging disk   Urinary frequency    Vocal fold atrophy    dysphonia-- per pt has to drink large amount of water to take even on pill   Wears glasses     Family History  Problem Relation Age of Onset   Parkinsonism Brother  COPD Mother    Allergies Mother    Heart failure Mother    COPD Father    Stroke Father    Other Sister    Suicidality Maternal Aunt    Cancer Maternal Grandfather     Past Surgical History:  Procedure Laterality Date   APPENDECTOMY  child   CARDIOVASCULAR STRESS TEST  05-17-2015  dr hilty   Low risk nuclear study w/ no ischemia/  normal LV function and wall motion , stress ef 54% (lvef 45-54%)   CHOLECYSTECTOMY N/A 09/29/2014   Procedure: LAPAROSCOPIC CHOLECYSTECTOMY WITH INTRAOPERATIVE CHOLANGIOGRAM;  Surgeon: Ovidio Kin, MD;  Location: [facility];  Service: General;  Laterality: N/A;   COLONOSCOPY  last one 09-06-2010   ESOPHAGOGASTRODUODENOSCOPY N/A 12/09/2013   Procedure: ESOPHAGOGASTRODUODENOSCOPY (EGD);  Surgeon: Willis Modena, MD;  Location: [facility];  Service: Endoscopy;  Laterality: N/A;   EXTRACORPOREAL SHOCK WAVE LITHOTRIPSY  yrs ago   INGUINAL HERNIA REPAIR Left child   inguinal hernia repair  09/2017   NISSEN FUNDOPLICATION  1980's   open   STONE EXTRACTION WITH BASKET N/A 08/18/2016   Procedure: STONE EXTRACTION WITH BASKET;  Surgeon: Jethro Bolus, MD;  Location: [facility];  Service: Urology;  Laterality: N/A;   THULIUM LASER TURP (TRANSURETHRAL RESECTION OF PROSTATE) N/A 08/18/2016   Procedure: THULIUM LASER BLADDER NECK INCISION AND BLADDER STONE REMOVAL;   Surgeon: Jethro Bolus, MD;  Location: [facility];  Service: Urology;  Laterality: N/A;   TONSILLECTOMY  child   TRANSTHORACIC ECHOCARDIOGRAM  11/18/2010   grade 1 diastolic dysfunction, ef 55-60%/  trivial MR and TR/ mild dilated RA   Social History   Occupational History   Occupation: teaches constitutional law   Occupation: Photographer: [employer]  Tobacco Use   Smoking status: Never   Smokeless tobacco: Never  Substance and Sexual Activity   Alcohol use: Yes    Comment: social   Drug use: No   Sexual activity: Not on file

## 2021-03-18 ENCOUNTER — Telehealth: Payer: Self-pay

## 2021-03-18 NOTE — Telephone Encounter (Signed)
Called and LVM. Based on last OV note from Dr. Marjory Lies. Pt was cleared and no f/u visit is needed at this time, unless symptoms worsen of fail to improve. Pt can return to PCP for continued care and medication refill.

## 2021-03-18 NOTE — Telephone Encounter (Signed)
Pt want to schedule appt because memory have gotten worse.  While scheduling an appt patient said I was told I did not need the appt. Informed pt I can schedule you an appt. Pt said never mind  going to see my PCP in a couple of weeks, you can cancel that appt.  Informed patient will send a message to the nurse for her to call you to explain the message she sent. Patient verbalized understanding.

## 2021-03-19 ENCOUNTER — Ambulatory Visit: Payer: Medicare Other | Admitting: Family Medicine

## 2021-03-19 NOTE — Telephone Encounter (Signed)
Returned patients call. I re-explained my last message to pt and verbalized understanding. He does notice worsening in memory but states he will allow more time and call us when needed. He was ok with cancelling today's appt and appreciated the call back.

## 2021-04-10 ENCOUNTER — Other Ambulatory Visit: Payer: Self-pay

## 2021-04-10 ENCOUNTER — Ambulatory Visit (INDEPENDENT_AMBULATORY_CARE_PROVIDER_SITE_OTHER): Payer: Medicare Other

## 2021-04-10 ENCOUNTER — Ambulatory Visit (INDEPENDENT_AMBULATORY_CARE_PROVIDER_SITE_OTHER): Payer: Medicare Other | Admitting: Orthopedic Surgery

## 2021-04-10 ENCOUNTER — Ambulatory Visit: Payer: Self-pay

## 2021-04-10 DIAGNOSIS — M79671 Pain in right foot: Secondary | ICD-10-CM | POA: Diagnosis not present

## 2021-04-10 DIAGNOSIS — M79672 Pain in left foot: Secondary | ICD-10-CM | POA: Diagnosis not present

## 2021-04-14 ENCOUNTER — Encounter: Payer: Self-pay | Admitting: Orthopedic Surgery

## 2021-04-14 NOTE — Progress Notes (Signed)
Office Visit Note   Patient: Lee Nelson           Date of Birth: Dec 16, 1940           MRN: [id number] Visit Date: 04/10/2021 Requested by: Prince Solian, MD [address] [place],  [place] [address] PCP: Prince Solian, MD  Subjective: Chief Complaint  Patient presents with   Right Foot - Pain   Left Foot - Pain    HPI: Kenichi is an 81 year old patient with bilateral foot pain right worse than left.  He does have a history of remote stress reaction several years ago diagnosed by MRI scan.  He has not had any recent falls or trauma.  He typically does not go barefoot in the house.  He does have well supported shoes.  He also describes some mild low back pain with occasional radiation into the lateral thigh region on the right-hand side.  He also describes some tenderness over the A1 pulley on his right hand fourth finger.  Does not take too much in terms of medication for many of these aches and pains.  He does have a known history of at least osteopenia.  His wife's sister suddenly passed away from an aneurysm recently.  This adds additional psychosocial stressors on [place] in terms of his caregiving role for his wife who has a spinal cord injury.              ROS: All systems reviewed are negative as they relate to the chief complaint within the history of present illness.  Patient denies  fevers or chills.   Assessment & Plan: Visit Diagnoses:  1. Bilateral foot pain     Plan: Impression is bilateral foot pain with no obvious swelling on exam or radiographic changes indicating early stress reaction or stress fracture.  I think this is something we can watch.  He also has tenderness over that right fourth A1 pulley but no distinct triggering.  That something we could inject if it becomes more symptomatic.  Right now his hand is functional.  The back pain is reasonably stable and he has not had any further falls since the last clinic visit.  Follow-Up Instructions: Return  if symptoms worsen or fail to improve.   Orders:  Orders Placed This Encounter  Procedures   XR Foot Complete Right   XR Foot Complete Left   No orders of the defined types were placed in this encounter.     Procedures: No procedures performed   Clinical Data: No additional findings.  Objective: Vital Signs: There were no vitals taken for this visit.  Physical Exam:   Constitutional: Patient appears well-developed HEENT:  Head: Normocephalic Eyes:EOM are normal Neck: Normal range of motion Cardiovascular: Normal rate Pulmonary/chest: Effort normal Neurologic: Patient is alert Skin: Skin is warm Psychiatric: Patient has normal mood and affect   Ortho Exam: Ortho exam demonstrates palpable pedal pulses with good ankle dorsiflexion plantarflexion quad and hamstring strength.  No nerve root tension signs.  Patient has symmetric tibiotalar subtalar transverse tarsal range of motion with palpable intact nontender anterior to posterior to peroneal and Achilles tendons.  No focal swelling or tenderness to palpation particularly along the metatarsal shafts of either foot.  No pain with pronation supination of either foot.  He does have tenderness over the A1 pulley right fourth finger but no discrete triggering.  No groin pain with internal or external rotation of either leg.  Specialty Comments:  No specialty comments available.  Imaging: No results found.   PMFS History: Patient Active Problem List   Diagnosis Date Noted   SBO (small bowel obstruction) ([place]) 12/31/2019   Amaurosis fugax of right eye 08/25/2017   PSVT (paroxysmal supraventricular tachycardia) ([place]) 09/12/2015   Chest pain 05/03/2015   Dyspnea 05/03/2015   ALLERGIC RHINITIS 09/21/2007   G E R D 09/21/2007   SMOKE INHALATION 09/21/2007   OSTEOPOROSIS 01/28/2007   PALPITATIONS 01/28/2007   COUGH 01/28/2007   ALLERGY 01/28/2007   Past Medical History:  Diagnosis Date   Anxiety    Arthritis     Bladder calculus    BPH (benign prostatic hyperplasia)    Chronic constipation    Complication of anesthesia    " I had some coughing afterwards for a couple of days"--  per pt "perfers spinal anesthesia since general anesthesia congnitive issues when older"   Diverticulosis of colon    Dry eye syndrome of both eyes    Environmental allergies    GERD (gastroesophageal reflux disease)    occasional   History of adenomatous polyp of colon    08/ 2004   History of kidney stones    History of squamous cell carcinoma in situ (SCCIS) of skin    s/p  excision 2013 facial areas and 06/ 2016 nose   Migraine    eye migraine occasional   Seasonal and perennial allergic rhinitis    Thrombocytopenia (HCC)    Tingling    hands and feet bilat , intermittantly-- per pt has lumbar bulging disk   Urinary frequency    Vocal fold atrophy    dysphonia-- per pt has to drink large amount of water to take even on pill   Wears glasses     Family History  Problem Relation Age of Onset   Parkinsonism Brother    COPD Mother    Allergies Mother    Heart failure Mother    COPD Father    Stroke Father    Other Sister    Suicidality Maternal Aunt    Cancer Maternal Grandfather     Past Surgical History:  Procedure Laterality Date   APPENDECTOMY  child   CARDIOVASCULAR STRESS TEST  05-17-2015  dr hilty   Low risk nuclear study w/ no ischemia/  normal LV function and wall motion , stress ef 54% (lvef 45-54%)   CHOLECYSTECTOMY N/A 09/29/2014   Procedure: LAPAROSCOPIC CHOLECYSTECTOMY WITH INTRAOPERATIVE CHOLANGIOGRAM;  Surgeon: Alphonsa Overall, MD;  Location: [facility];  Service: General;  Laterality: N/A;   COLONOSCOPY  last one 09-06-2010   ESOPHAGOGASTRODUODENOSCOPY N/A 12/09/2013   Procedure: ESOPHAGOGASTRODUODENOSCOPY (EGD);  Surgeon: Arta Silence, MD;  Location: [facility] ENDOSCOPY;  Service: Endoscopy;  Laterality: N/A;   EXTRACORPOREAL SHOCK WAVE LITHOTRIPSY  yrs ago   INGUINAL HERNIA REPAIR Left child    inguinal hernia repair  [id number]   NISSEN FUNDOPLICATION  [id number]   open   STONE EXTRACTION WITH BASKET N/A 08/18/2016   Procedure: STONE EXTRACTION WITH BASKET;  Surgeon: Carolan Clines, MD;  Location[facility];  Service: Urology;  Laterality: N/A;   THULIUM LASER TURP (TRANSURETHRAL RESECTION OF PROSTATE) N/A 08/18/2016   Procedure: THULIUM LASER BLADDER NECK INCISION AND BLADDER STONE REMOVAL;  Surgeon: Carolan Clines, MD;  Location: [facility];  Service: Urology;  Laterality: N/A;   TONSILLECTOMY  child   TRANSTHORACIC ECHOCARDIOGRAM  11/18/2010   grade 1 diastolic dysfunction, ef [id number]  trivial MR and TR/ mild dilated RA   Social History  Occupational History   Occupation: teaches constitutional law   Occupation: Designer, jewellery: [employer]  Tobacco Use   Smoking status: Never   Smokeless tobacco: Never  Substance and Sexual Activity   Alcohol use: Yes    Comment: social   Drug use: No   Sexual activity: Not on file

## 2021-04-16 ENCOUNTER — Ambulatory Visit: Payer: Medicare Other | Admitting: Physician Assistant

## 2021-04-22 ENCOUNTER — Other Ambulatory Visit: Payer: Self-pay

## 2021-04-22 ENCOUNTER — Ambulatory Visit (INDEPENDENT_AMBULATORY_CARE_PROVIDER_SITE_OTHER): Payer: Medicare Other | Admitting: Diagnostic Neuroimaging

## 2021-04-22 ENCOUNTER — Ambulatory Visit: Payer: Medicare Other | Admitting: Diagnostic Neuroimaging

## 2021-04-22 ENCOUNTER — Telehealth: Payer: Self-pay

## 2021-04-22 ENCOUNTER — Encounter: Payer: Self-pay | Admitting: Diagnostic Neuroimaging

## 2021-04-22 VITALS — BP 104/64 | HR 64 | Ht 70.0 in | Wt 120.0 lb

## 2021-04-22 DIAGNOSIS — G3184 Mild cognitive impairment, so stated: Secondary | ICD-10-CM | POA: Diagnosis not present

## 2021-04-22 NOTE — Progress Notes (Signed)
[name]  PATIENT: Lee Nelson DOB: 1941/01/15  REFERRING CLINICIAN: Avva HISTORY FROM: patient REASON FOR VISIT: follow up   HISTORICAL  CHIEF COMPLAINT:  Chief Complaint  Patient presents with   Follow-up    Rm 6 here for f/u on memory feels like short term memory has declined since last visit.     HISTORY OF PRESENT ILLNESS:   UPDATE (04/22/21, VRP): Since last visit, continued short term memory loss issues subjectively. Still maintaining ADLs. Declined psychology eval last time.  UPDATE (12/11/20, VRP): Since last visit, doing poorly. Continues with memory loss and balance issues. Poor sleep. More stress. Has good insight into his issues.  UPDATE (06/04/20, VRP): Since last visit, doing poorly. Continues with stress and mild memory issues and depression. Has feelings of regret towards prior financial decisions and health care for his wife.   UPDATE (05/03/19, VRP): Since last visit, doing slightly worse, more memory loss, stress, gait difficulty. No alleviating or aggravating factors. More stress related to wife's health, cargiving expenses, having to retire from [employer].     UPDATE (06/01/18, VRP): Since last visit, noting more memory issues. Has missed a meeting. Some words are jumbled. Symptoms are progressive. Insomnia and anxiety have worsened.   UPDATE (11/09/17, VRP): Since last visit, doing about the same. Symptoms are stable. Severity is mild. No alleviating or aggravating factors. Tolerating supplements. PT exercises helped, but now recovering from hernia 10/16/17. Also had transient right eye vision changes in May 2019 (shade over the right eye) x 1 minute. No recurrence.  UPDATE (05/07/17, VRP): Since last visit, doing about the same. Tolerating meds. No alleviating or aggravating factors. Has had more falls and balance issues. More memory loss issues.   UPDATE 12/30/16: Since last visit, continues with word finding diff. Also with intermittent  right sided headaches, intermittent double vision, and neck pain. Avg 1-2 days per week of headache.   UPDATE 03/10/14: Since last visit, no more double vision attacks. Notes some work finding diff. Still with stress from his wife's quadriplegia and 24 hour care at home.   PRIOR HPI (09/07/13): 81 year old right-handed male with history of neck pain, back pain, skin cancer, reflux disease, osteoporosis, anxiety, fibromyalgia, migraine, here for evaluation of headaches and transient double vision. Previous evaluated patient in 2012 for right arm numbness. Patient reports over ten-year history of intermittent neck pain. Patient has some degenerative cervical spine disease managed conservatively. Patient has one to 2 year history of intermittent headaches mainly in the right parietal and right occipital region. Some pain radiates from the neck up to the head. No nausea or vomiting. No photophobia or phonophobia. Sometimes he has sharp shooting brief pains. He rates pain severity 2-3/10. More recently headache symptoms have been more constant. No visual symptoms associated with headache. Separately patient has intermittent visual disturbance where he sees "swimming lines" in front of him, but these are not associated with headache. 10 days ago patient was at a meeting talking to some people, when he had sudden onset of vertical double vision lasting for one to 2 minutes. He had mild queasiness sensation. No vomiting. He had mild equilibrium problem without falling out of his chair. No slurred speech. No numbness or tingling. Symptoms resolved on their own. No headaches associated with this.   REVIEW OF SYSTEMS: Full 14 system review of systems performed and negative except: as per HPI.  ALLERGIES: Allergies  Allergen Reactions   Ciprofloxacin     JOINT PAIN  Flagyl [Metronidazole] Other (See Comments)    REACTION: no appetite, diarrhea after meal, decrease in weight   Metoclopramide Hcl Other (See  Comments)    REACTION: "involuntary movements"   Other Other (See Comments)    Antibiotics have unknown reaction propophol causes memory problems   Propofol Other (See Comments)   Risperidone Other (See Comments)    "do not want"   Septra [Sulfamethoxazole-Trimethoprim] Other (See Comments)    REACTION: "involuntary movements" tripac antibiotic- heart rythm   Silodosin     ? Possibly allergy, could not breathe well out of nose   Soy Allergy Other (See Comments)    Stomach upset, "gas"    HOME MEDICATIONS: Outpatient Medications Prior to Visit  Medication Sig Dispense Refill   acetaminophen-codeine (TYLENOL #3) 300-30 MG tablet 1 po q 8 prn pain 30 tablet 0   Acetylcysteine (NAC) 500 MG CAPS Take by mouth.     B Complex Vitamins (VITAMIN B COMPLEX) TABS 1 tab     Cholecalciferol (VITAMIN D-3) 1000 units CAPS Take 2,000 Units by mouth daily.      Cranberry 400 MG CAPS Take 400-1,200 mg by mouth every morning.      docusate sodium (COLACE) 50 MG capsule Take 1 capsule (50 mg total) by mouth at bedtime. 10 capsule 0   MAGNESIUM CITRATE PO Take 75 mg by mouth daily.     Menaquinone-7 (VITAMIN K2 PO) Take 120-240 mg by mouth daily. Combo w/ Vit. D     NON FORMULARY Take 1 capsule by mouth daily. Acetyl L carnitine     OVER THE COUNTER MEDICATION Take 3 capsules by mouth 2 (two) times daily. Lion's mane 0.5 gram/capsule     OVER THE COUNTER MEDICATION Take 2 capsules by mouth every morning. Reparagen supplement     OVER THE COUNTER MEDICATION Melatonin patch     Phosphatidylserine 100 MG CAPS Take 100 mg by mouth every morning.      Probiotic Product (PROBIOTIC DAILY PO) Take 1 capsule by mouth daily.      Propylene Glycol (SYSTANE BALANCE) 0.6 % SOLN Apply 1 drop to eye daily as needed (dry eyes).      sodium chloride (MURO 128) 5 % ophthalmic ointment Place 1 application into both eyes at bedtime.      sodium chloride (MURO 128) 5 % ophthalmic solution See admin instructions.      tadalafil (CIALIS) 5 MG tablet Take by mouth.     THEANINE PO Take 2 tablets by mouth 2 (two) times daily.      THIAMINE HCL PO Take by mouth.     UNABLE TO FIND Med Name: cocovia memory     vitamin C (ASCORBIC ACID) 250 MG tablet Take 250 mg by mouth daily.     No facility-administered medications prior to visit.    PAST MEDICAL HISTORY: Past Medical History:  Diagnosis Date   Anxiety    Arthritis    Bladder calculus    BPH (benign prostatic hyperplasia)    Chronic constipation    Complication of anesthesia    " I had some coughing afterwards for a couple of days"--  per pt "perfers spinal anesthesia since general anesthesia congnitive issues when older"   Diverticulosis of colon    Dry eye syndrome of both eyes    Environmental allergies    GERD (gastroesophageal reflux disease)    occasional   History of adenomatous polyp of colon    08/ 2004   History of kidney  stones    History of squamous cell carcinoma in situ (SCCIS) of skin    s/p  excision 2013 facial areas and 06/ 2016 nose   Migraine    eye migraine occasional   Seasonal and perennial allergic rhinitis    Thrombocytopenia (HCC)    Tingling    hands and feet bilat , intermittantly-- per pt has lumbar bulging disk   Urinary frequency    Vocal fold atrophy    dysphonia-- per pt has to drink large amount of water to take even on pill   Wears glasses     PAST SURGICAL HISTORY: Past Surgical History:  Procedure Laterality Date   APPENDECTOMY  child   CARDIOVASCULAR STRESS TEST  05-17-2015  dr hilty   Low risk nuclear study w/ no ischemia/  normal LV function and wall motion , stress ef 54% (lvef 45-54%)   CHOLECYSTECTOMY N/A 09/29/2014   Procedure: LAPAROSCOPIC CHOLECYSTECTOMY WITH INTRAOPERATIVE CHOLANGIOGRAM;  Surgeon: Alphonsa Overall, MD;  Location: [facility];  Service: General;  Laterality: N/A;   COLONOSCOPY  last one 09-06-2010   ESOPHAGOGASTRODUODENOSCOPY N/A 12/09/2013   Procedure: ESOPHAGOGASTRODUODENOSCOPY  (EGD);  Surgeon: Arta Silence, MD;  Location[facility] ENDOSCOPY;  Service: Endoscopy;  Laterality: N/A;   EXTRACORPOREAL SHOCK WAVE LITHOTRIPSY  yrs ago   INGUINAL HERNIA REPAIR Left child   inguinal hernia repair  [id number]   NISSEN FUNDOPLICATION  [id number]   open   STONE EXTRACTION WITH BASKET N/A 08/18/2016   Procedure: STONE EXTRACTION WITH BASKET;  Surgeon: Carolan Clines, MD;  Location: [facility];  Service: Urology;  Laterality: N/A;   THULIUM LASER TURP (TRANSURETHRAL RESECTION OF PROSTATE) N/A 08/18/2016   Procedure: THULIUM LASER BLADDER NECK INCISION AND BLADDER STONE REMOVAL;  Surgeon: Carolan Clines, MD;  Location: [facility];  Service: Urology;  Laterality: N/A;   TONSILLECTOMY  child   TRANSTHORACIC ECHOCARDIOGRAM  11/18/2010   grade 1 diastolic dysfunction, ef [id number]  trivial MR and TR/ mild dilated RA    FAMILY HISTORY: Family History  Problem Relation Age of Onset   Parkinsonism Brother    COPD Mother    Allergies Mother    Heart failure Mother    COPD Father    Stroke Father    Other Sister    Suicidality Maternal Aunt    Cancer Maternal Grandfather     SOCIAL HISTORY:  Social History   Socioeconomic History   Marital status: Married    Spouse name: Neoma Laming   Number of children: 1   Years of education: BA, MA, JD   Highest education level: Not on file  Occupational History   Occupation: teaches constitutional law   Occupation: Designer, jewellery: [employer]  Tobacco Use   Smoking status: Never   Smokeless tobacco: Never  Substance and Sexual Activity   Alcohol use: Yes    Comment: social   Drug use: No   Sexual activity: Not on file  Other Topics Concern   Not on file  Social History Narrative   Patient lives at home wife.   Daily caffeine use   Social Determinants of Health   Financial Resource Strain: Not on file  Food Insecurity: Not on file  Transportation Needs: Not on file  Physical  Activity: Not on file  Stress: Not on file  Social Connections: Not on file  Intimate Partner Violence: Not on file     PHYSICAL EXAM  GENERAL EXAM/CONSTITUTIONAL: Vitals:  There were no vitals  filed for this visit.  There is no height or weight on file to calculate BMI. Wt Readings from Last 3 Encounters:  12/25/20 125 lb (56.7 kg)  12/11/20 124 lb 3.2 oz (56.3 kg)  06/04/20 120 lb 12.8 oz (54.8 kg)   Patient is in no distress; well developed, nourished and groomed; neck is supple  CARDIOVASCULAR: Examination of carotid arteries is normal; no carotid bruits Regular rate and rhythm, no murmurs Examination of peripheral vascular system by observation and palpation is normal  EYES: Ophthalmoscopic exam of optic discs and posterior segments is normal; no papilledema or hemorrhages No results found.  MUSCULOSKELETAL: Gait, strength, tone, movements noted in Neurologic exam below  NEUROLOGIC: MENTAL STATUS:  MMSE - Mini Mental State Exam 12/11/2020 06/04/2020 05/03/2019  Orientation to time 5 5 5   Orientation to Place 5 5 5   Registration 3 3 3   Attention/ Calculation 4 2 5   Recall 3 3 3   Language- name 2 objects 2 2 2   Language- repeat 1 1 1   Language- follow 3 step command 3 3 3   Language- read & follow direction 1 1 1   Write a sentence 1 1 1   Copy design 1 1 1   Total score 29 27 30    awake, alert, oriented to person, place and time recent and remote memory intact normal attention and concentration language fluent, comprehension intact, naming intact fund of knowledge appropriate  CRANIAL NERVE:  2nd - no papilledema on fundoscopic exam 2nd, 3rd, 4th, 6th - pupils equal and reactive to light, visual fields full to confrontation, extraocular muscles intact, no nystagmus 5th - facial sensation symmetric 7th - facial strength symmetric 8th - hearing intact 9th - palate elevates symmetrically, uvula midline 11th - shoulder shrug symmetric 12th - tongue protrusion  midline  MOTOR:  normal bulk and tone, full strength in the BUE, BLE MILD BRADYKINESIA IN BUE / BLE  SENSORY:  normal and symmetric to light touch, temperature, vibration  COORDINATION:  finger-nose-finger, fine finger movements normal  REFLEXES:  deep tendon reflexes --> BUE 1-2; BLE 2-3; symmetric  GAIT/STATION:  narrow based gait; STOOPED POSTURE; SLIGHT UNSTEADY GAIT; USING CANE     DIAGNOSTIC DATA (LABS, IMAGING, TESTING) - I reviewed patient records, labs, notes, testing and imaging myself where available.  Lab Results  Component Value Date   WBC 5.3 01/04/2020   HGB 12.1 (L) 01/04/2020   HCT 36.7 (L) 01/04/2020   MCV 100.3 (H) 01/04/2020   PLT 120 (L) 01/04/2020      Component Value Date/Time   NA 141 01/04/2020 0327   K 3.7 01/04/2020 0327   CL 107 01/04/2020 0327   CO2 26 01/04/2020 0327   GLUCOSE 97 01/04/2020 0327   BUN 9 01/04/2020 0327   CREATININE 0.62 01/04/2020 0327   CALCIUM 8.3 (L) 01/04/2020 0327   PROT 5.4 (L) 01/01/2020 0253   ALBUMIN 2.9 (L) 01/04/2020 0327   AST 19 01/01/2020 0253   ALT 16 01/01/2020 0253   ALKPHOS 57 01/01/2020 0253   BILITOT 0.8 01/01/2020 0253   GFRNONAA >60 01/04/2020 0327   GFRAA >60 01/03/2020 0255   No results found for: CHOL No results found for: HGBA1C Lab Results  Component Value Date   VITAMINB12 711 05/08/2017   No results found for: TSH   07/20/10 MRI CERVICAL 1. Left C6-C7 paracentral disc protrusion abutting the exiting left C7 nerve roots appears new. Preexisting mild left C7 foraminal stenosis at this level due to facet hypertrophy.  2. Mild combined  congenital and acquired spinal stenosis at the C3-C4 and C4-C5.  11/20/10 MRI brain (without contrast) demonstrating: 1. Moderate ventriculomegaly in the temporal and occipital horns.  Likely due to subcortical atrophy. 2. No acute findings are seen.  11/20/10 MRA head (without contrast) demonstrating: - mild atheromatous irregularities in the  bilateral carotid siphon regions.  11/19/10 EMG/NCS - normal  11/19/10 carotid u/s - normal  11/18/10 TTE - EF 55-60%; normal wall motion  09/19/13 MRI brain (with and without) demonstrating: 1. Moderate mesial temporal atrophy. 2. Moderate ventriculomegaly on ex vacuo basis. 3. No acute findings.  11/11/16 MRI brain [I reviewed images myself and agree with interpretation. -VRP]  1. No acute intracranial process. 2. Progressed nonspecific moderate to severe parenchymal brain volume loss for age. No hydrocephalus.  02/02/17 MRI cervical spine [I reviewed images myself and agree with interpretation. -VRP]  1. At C3-C4, there is borderline spinal stenosis due to ligamentum flavum hypertrophy and congenitally short pedicles.   There is no nerve root compression. 2. At C4-C5, there is mild spinal stenosis due to disc bulging, ligament of flavum hypertrophy, facet hypertrophy, mild uncovertebral spurring and congenitally short pedicles. There is no nerve root compression. Degenerative changes at this level have slightly progressed compared to the 07/20/2010 MRI. 3. At C5-C6, there are degenerative changes no nerve root compression or spinal stenosis 4. At C6-C7, there are degenerative changes causing mild foraminal narrowing but no nerve root compression.   On the previous MRI there was a disc protrusion to the left that is not appreciated on the current study. 5. The spinal cord has normal signal.    08/11/17 CAROTID U/S Right Carotid: There was no evidence of thrombus, dissection, atherosclerotic plaque or stenosis in the cervical carotid system.  Left Carotid: Velocities in the left ICA are consistent with a 1-39% stenosis.  Vertebrals:  Bilateral vertebral arteries demonstrate antegrade flow.  Subclavians: Normal flow hemodynamics were seen in bilateral subclavian arteries.   01/29/18 MRI cervical spine - Mild degenerative changes in the cervical spine. Mild spinal and foraminal stenosis  as above.  07/15/20 MRI lumbar spine - Negative for compression fracture. - Mild degenerative changes for age.  No neural compression.    ASSESSMENT AND PLAN  81 y.o. year old male here with chronic neck pain x 10 years, int HA x 1-2 years, with a 2 minute episode of vertical double vision in June 2015, with no recurrence.   Also with gait diff and memory loss and weight loss and gait difficulty.  Dx:  MCI (mild cognitive impairment)    PLAN:  MEMORY LOSS / ANXIETY / DEPRESSION - progressed per patient, but has been stable over many years on testing; no major changes in ADLs; continue good nutrition, exercises, brain stimulating activities - improve nutrition, exercise, mood stabilization and insomnia - I had referred patient to psychology for depression / anxiety / insomnia; but patient declined (Sept 2022) - consider SSRI, mirtazapine  GAIT DIFFICULTY (due to deconditioning, weight loss, cervical spinal stenosis, hip pain, arthritis) - stable; continue exercises; use cane / walker as needed - consider PT evaluation; start exercise / strength training program  TIA (right eye amaurosis fugax; May 2019) - advised to take aspirin 81mg  daily, but caution with history of low platelets and current curcumin use  MIGRAINE WITH AURA - improved; monitor for now (did not tolerated amitriptyline)  Return for return to PCP.       Penni Bombard, MD [id number], [id number] PM Certified in Neurology, Neurophysiology  and Neuroimaging  [facility] [address] [place], [place] [address] [phone]

## 2021-04-22 NOTE — Telephone Encounter (Signed)
Contacted pt again, LVM rq call back to reschedule appt from 330 to 1pm today

## 2021-04-22 NOTE — Telephone Encounter (Signed)
Contacted pt per Dr Marjory Lies, He will need to reschedule pt from 330 to 1 pm today due to him having to leave the office early.  Will sent pt MyChart message as well.

## 2021-08-09 ENCOUNTER — Ambulatory Visit (INDEPENDENT_AMBULATORY_CARE_PROVIDER_SITE_OTHER): Payer: Medicare Other | Admitting: Orthopedic Surgery

## 2021-08-09 ENCOUNTER — Encounter: Payer: Self-pay | Admitting: Orthopedic Surgery

## 2021-08-09 ENCOUNTER — Ambulatory Visit: Payer: Medicare Other

## 2021-08-09 DIAGNOSIS — M25552 Pain in left hip: Secondary | ICD-10-CM | POA: Diagnosis not present

## 2021-08-09 NOTE — Progress Notes (Signed)
? ?Office Visit Note ?  ?Patient: Lee Nelson           ?Date of Birth: 28-Sep-1940           ?MRN: [id number] ?Visit Date: 08/09/2021 ?Requested by: Chilton Greathouse, MD ?[address] ?[place],  [place] [address] ?PCP: Chilton Greathouse, MD ? ?Subjective: ?Chief Complaint  ?Patient presents with  ? Left Hip - Pain  ? ? ?HPI: Lee Nelson is an 81 year old patient with left posterior buttock pain and occasional groin pain.  Started about 2 weeks ago.  Is actually better today since he has been resting.  Denies any history of injury.  Describes mild pain in the groin but most of his pain is in the posterior sacroiliac region on the left-hand side.  This was episodic in nature.  He was able to bear weight during the worst part of the pain.  Now the pain has resolved for the most part.  States that his ischial bones were sore as well.  He is not taking much in the way of medication for the problem.  He stopped doing his exercises as a result of his symptoms.  Tylenol was not particularly helpful. ?             ?ROS: All systems reviewed are negative as they relate to the chief complaint within the history of present illness.  Patient denies  fevers or chills. ? ? ?Assessment & Plan: ?Visit Diagnoses:  ?1. Pain in left hip   ? ? ?Plan: Impression is left posterior buttock and sacroiliac/sacral type pain.  Due to the relatively quick onset and symptomatic improvement I think this is likely coming from his back.  Stress reaction in either the sacrum or the proximal femur would be more consistent.  With any history of trauma and his gait appearing as it did today I think we can watch this for now.  If his symptoms recur I would encourage repeat follow-up with plain radiographs.  He has had imaging in the past of the lumbar spine in 2022 which did show some L4-5 degenerative disc disease.  Pelvis MRI also showed mild degenerative changes but nothing too significant.  We could repeat those studies if symptoms recur. ? ?Follow-Up  Instructions: Return if symptoms worsen or fail to improve.  ? ?Orders:  ?No orders of the defined types were placed in this encounter. ? ?No orders of the defined types were placed in this encounter. ? ? ? ? Procedures: ?No procedures performed ? ? ?Clinical Data: ?No additional findings. ? ?Objective: ?Vital Signs: There were no vitals taken for this visit. ? ?Physical Exam:  ? ?Constitutional: Patient appears well-developed ?HEENT:  ?Head: Normocephalic ?Eyes:EOM are normal ?Neck: Normal range of motion ?Cardiovascular: Normal rate ?Pulmonary/chest: Effort normal ?Neurologic: Patient is alert ?Skin: Skin is warm ?Psychiatric: Patient has normal mood and affect ? ? ?Ortho Exam: Ortho exam demonstrates pretty normal gait alignment when he walks with a cane.  There is no Trendelenburg gait.  No nerve root tension signs.  5 out of 5 ankle dorsiflexion plantarflexion quad hamstring strength.  Lee Nelson has lost some weight since I have last seen him.  No groin pain with internal and external rotation of either hip.  He has excellent hip flexion abduction adduction strength bilaterally.  Minimal tenderness to direct palpation around the trochanteric region.  He is got some mild pain around the sacrum.  Both feet are perfused and sensate. ? ?Specialty Comments:  ?No specialty comments available. ? ?Imaging: ?  No results found. ? ? ?PMFS History: ?Patient Active Problem List  ? Diagnosis Date Noted  ? SBO (small bowel obstruction) (HCC) 12/31/2019  ? Amaurosis fugax of right eye 08/25/2017  ? PSVT (paroxysmal supraventricular tachycardia) (HCC) 09/12/2015  ? Chest pain 05/03/2015  ? Dyspnea 05/03/2015  ? ALLERGIC RHINITIS 09/21/2007  ? G E R D 09/21/2007  ? SMOKE INHALATION 09/21/2007  ? OSTEOPOROSIS 01/28/2007  ? PALPITATIONS 01/28/2007  ? COUGH 01/28/2007  ? ALLERGY 01/28/2007  ? ?Past Medical History:  ?Diagnosis Date  ? Anxiety   ? Arthritis   ? Bladder calculus   ? BPH (benign prostatic hyperplasia)   ? Chronic  constipation   ? Complication of anesthesia   ? " I had some coughing afterwards for a couple of days"--  per pt "perfers spinal anesthesia since general anesthesia congnitive issues when older"  ? Diverticulosis of colon   ? Dry eye syndrome of both eyes   ? Environmental allergies   ? GERD (gastroesophageal reflux disease)   ? occasional  ? History of adenomatous polyp of colon   ? 08/ 2004  ? History of kidney stones   ? History of squamous cell carcinoma in situ (SCCIS) of skin   ? s/p  excision 2013 facial areas and 06/ 2016 nose  ? Migraine   ? eye migraine occasional  ? Seasonal and perennial allergic rhinitis   ? Thrombocytopenia (HCC)   ? Tingling   ? hands and feet bilat , intermittantly-- per pt has lumbar bulging disk  ? Urinary frequency   ? Vocal fold atrophy   ? dysphonia-- per pt has to drink large amount of water to take even on pill  ? Wears glasses   ?  ?Family History  ?Problem Relation Age of Onset  ? Parkinsonism Brother   ? COPD Mother   ? Allergies Mother   ? Heart failure Mother   ? COPD Father   ? Stroke Father   ? Other Sister   ? Suicidality Maternal Aunt   ? Cancer Maternal Grandfather   ?  ?Past Surgical History:  ?Procedure Laterality Date  ? APPENDECTOMY  child  ? CARDIOVASCULAR STRESS TEST  05-17-2015  dr hilty  ? Low risk nuclear study w/ no ischemia/  normal LV function and wall motion , stress ef 54% (lvef 45-54%)  ? CHOLECYSTECTOMY N/A 09/29/2014  ? Procedure: LAPAROSCOPIC CHOLECYSTECTOMY WITH INTRAOPERATIVE CHOLANGIOGRAM;  Surgeon: Ovidio Kinavid Newman, MD;  Location: [facility];  Service: General;  Laterality: N/A;  ? COLONOSCOPY  last one 09-06-2010  ? ESOPHAGOGASTRODUODENOSCOPY N/A 12/09/2013  ? Procedure: ESOPHAGOGASTRODUODENOSCOPY (EGD);  Surgeon: Willis ModenaWilliam Outlaw, MD;  [facility];  Service: Endoscopy;  Laterality: N/A;  ? EXTRACORPOREAL SHOCK WAVE LITHOTRIPSY  yrs ago  ? INGUINAL HERNIA REPAIR Left child  ? inguinal hernia repair  09/2017  ? NISSEN FUNDOPLICATION  1980's  ?  open  ? STONE EXTRACTION WITH BASKET N/A 08/18/2016  ? Procedure: STONE EXTRACTION WITH BASKET;  Surgeon: Jethro Bolusannenbaum, Sigmund, MD;  [facility] [place];  Service: Urology;  Laterality: N/A;  ? THULIUM LASER TURP (TRANSURETHRAL RESECTION OF PROSTATE) N/A 08/18/2016  ? Procedure: THULIUM LASER BLADDER NECK INCISION AND BLADDER STONE REMOVAL;  Surgeon: Jethro Bolusannenbaum, Sigmund, MD;  [facility] [place];  Service: Urology;  Laterality: N/A;  ? TONSILLECTOMY  child  ? TRANSTHORACIC ECHOCARDIOGRAM  11/18/2010  ? grade 1 diastolic dysfunction, ef 55-60%/  trivial MR and TR/ mild dilated RA  ? ?Social History  ? ?  Occupational History  ? Occupation: teaches constitutional law  ? Occupation: PROFESSOR  ?  Employer: [employer]  ?Tobacco Use  ? Smoking status: Never  ? Smokeless tobacco: Never  ?Substance and Sexual Activity  ? Alcohol use: Yes  ?  Comment: social  ? Drug use: No  ? Sexual activity: Not on file  ? ? ? ? ? ?

## 2021-09-03 ENCOUNTER — Encounter: Payer: Self-pay | Admitting: Diagnostic Neuroimaging

## 2021-09-03 NOTE — Telephone Encounter (Signed)
Error

## 2021-09-26 ENCOUNTER — Telehealth: Payer: Self-pay | Admitting: Diagnostic Neuroimaging

## 2021-09-26 NOTE — Telephone Encounter (Signed)
Called patient who had multiple concerns we discussed, answered questions to his stated satisfaction. Patient verbalized understanding, appreciation.

## 2021-09-26 NOTE — Telephone Encounter (Signed)
Pt would like a call from the nurse to discuss Dr. Marjory Lies remaining as my neurologist.

## 2021-12-13 ENCOUNTER — Ambulatory Visit (INDEPENDENT_AMBULATORY_CARE_PROVIDER_SITE_OTHER): Payer: Medicare Other | Admitting: Surgical

## 2021-12-13 ENCOUNTER — Telehealth: Payer: Self-pay | Admitting: Orthopedic Surgery

## 2021-12-13 ENCOUNTER — Ambulatory Visit (INDEPENDENT_AMBULATORY_CARE_PROVIDER_SITE_OTHER): Payer: Medicare Other

## 2021-12-13 DIAGNOSIS — M25551 Pain in right hip: Secondary | ICD-10-CM

## 2021-12-13 NOTE — Telephone Encounter (Signed)
Pt called and states he had fallen about 2 feet this morning on his pelvis. He would like someone to call him back.   CB [phone]

## 2021-12-13 NOTE — Telephone Encounter (Signed)
[name] ok'd work in. Patient will come at 1pm for work in

## 2021-12-14 ENCOUNTER — Encounter: Payer: Self-pay | Admitting: Surgical

## 2021-12-14 NOTE — Progress Notes (Signed)
Office Visit Note   Patient: Lee Nelson           Date of Birth: Feb 15, 1941           MRN: [id number] Visit Date: 12/13/2021 Requested by: Chilton Greathouse, MD [address] [place],  [place] [address] PCP: Chilton Greathouse, MD  Subjective: Chief Complaint  Patient presents with   Right Hip - Pain    HPI: Lee Nelson is a 81 y.o. male who presents to the office complaining of pelvic pain.  Patient states that he fell about 1 to 2 feet onto his buttock earlier today.  He notes increased pain in the right buttocks is about 20% worse than his typical pain.  No groin pain.  No increased low back pain.  No radicular pain or numbness/tingling in either leg.  He feels sore but has no difficulty with weightbearing or sitting..                ROS: All systems reviewed are negative as they relate to the chief complaint within the history of present illness.  Patient denies fevers or chills.  Assessment & Plan: Visit Diagnoses:  1. Pain in right hip     Plan: Patient is a 81 year old male who presents for evaluation of buttock pain.  Fell earlier this morning with a low-energy fall onto his right buttock.  Little bit of increased pain and soreness but nothing keeping him from weightbearing or sitting.  No groin pain to a significant extent.  Radiographs taken today of the pelvis demonstrate no acute fracture.  Plan for patient to continue with his home exercise program and he would like to start physical therapy upstairs for 1 visit to design a strength training program for his arms and his back.  He will follow-up with the office as needed if pain does not improve over the next several weeks or if pain gets worse.  Follow-Up Instructions: No follow-ups on file.   Orders:  Orders Placed This Encounter  Procedures   XR Pelvis 1-2 Views   Ambulatory referral to Physical Therapy   No orders of the defined types were placed in this encounter.     Procedures: No procedures  performed   Clinical Data: No additional findings.  Objective: Vital Signs: There were no vitals taken for this visit.  Physical Exam:  Constitutional: Patient appears well-developed HEENT:  Head: Normocephalic Eyes:EOM are normal Neck: Normal range of motion Cardiovascular: Normal rate Pulmonary/chest: Effort normal Neurologic: Patient is alert Skin: Skin is warm Psychiatric: Patient has normal mood and affect  Ortho Exam: Ortho exam demonstrates no ecchymosis noted over the lumbar spine or buttocks.  He has a little bit of point tenderness over the superior aspect of the right buttock but this is only mild to moderate.  No tenderness over the greater trochanters of either hip.  He does not really have any significant pain with FADIR sign or Stinchfield sign.  No knee effusion in either knee.  Intact hip flexion, quadricep, hamstring, dorsiflexion, plantarflexion.  Specialty Comments:  No specialty comments available.  Imaging: No results found.   PMFS History: Patient Active Problem List   Diagnosis Date Noted   SBO (small bowel obstruction) (HCC) 12/31/2019   Amaurosis fugax of right eye 08/25/2017   PSVT (paroxysmal supraventricular tachycardia) (HCC) 09/12/2015   Chest pain 05/03/2015   Dyspnea 05/03/2015   ALLERGIC RHINITIS 09/21/2007   G E R D 09/21/2007   SMOKE INHALATION 09/21/2007   OSTEOPOROSIS  01/28/2007   PALPITATIONS 01/28/2007   COUGH 01/28/2007   ALLERGY 01/28/2007   Past Medical History:  Diagnosis Date   Anxiety    Arthritis    Bladder calculus    BPH (benign prostatic hyperplasia)    Chronic constipation    Complication of anesthesia    " I had some coughing afterwards for a couple of days"--  per pt "perfers spinal anesthesia since general anesthesia congnitive issues when older"   Diverticulosis of colon    Dry eye syndrome of both eyes    Environmental allergies    GERD (gastroesophageal reflux disease)    occasional   History of  adenomatous polyp of colon    08/ 2004   History of kidney stones    History of squamous cell carcinoma in situ (SCCIS) of skin    s/p  excision 2013 facial areas and 06/ 2016 nose   Migraine    eye migraine occasional   Seasonal and perennial allergic rhinitis    Thrombocytopenia (HCC)    Tingling    hands and feet bilat , intermittantly-- per pt has lumbar bulging disk   Urinary frequency    Vocal fold atrophy    dysphonia-- per pt has to drink large amount of water to take even on pill   Wears glasses     Family History  Problem Relation Age of Onset   Parkinsonism Brother    COPD Mother    Allergies Mother    Heart failure Mother    COPD Father    Stroke Father    Other Sister    Suicidality Maternal Aunt    Cancer Maternal Grandfather     Past Surgical History:  Procedure Laterality Date   APPENDECTOMY  child   CARDIOVASCULAR STRESS TEST  05-17-2015  dr hilty   Low risk nuclear study w/ no ischemia/  normal LV function and wall motion , stress ef 54% (lvef 45-54%)   CHOLECYSTECTOMY N/A 09/29/2014   Procedure: LAPAROSCOPIC CHOLECYSTECTOMY WITH INTRAOPERATIVE CHOLANGIOGRAM;  Surgeon: Alphonsa Overall, MD;  Location: [facility];  Service: General;  Laterality: N/A;   COLONOSCOPY  last one 09-06-2010   ESOPHAGOGASTRODUODENOSCOPY N/A 12/09/2013   Procedure: ESOPHAGOGASTRODUODENOSCOPY (EGD);  Surgeon: Arta Silence, MD;  [facility];  Service: Endoscopy;  Laterality: N/A;   EXTRACORPOREAL SHOCK WAVE LITHOTRIPSY  yrs ago   INGUINAL HERNIA REPAIR Left child   inguinal hernia repair  41/9622   NISSEN FUNDOPLICATION  2979'G   open   STONE EXTRACTION WITH BASKET N/A 08/18/2016   Procedure: STONE EXTRACTION WITH BASKET;  Surgeon: Carolan Clines, MD;  [facility];  Service: Urology;  Laterality: N/A;   THULIUM LASER TURP (TRANSURETHRAL RESECTION OF PROSTATE) N/A 08/18/2016   Procedure: THULIUM LASER BLADDER NECK INCISION AND BLADDER STONE REMOVAL;   Surgeon: Carolan Clines, MD;  Location: [facility];  Service: Urology;  Laterality: N/A;   TONSILLECTOMY  child   TRANSTHORACIC ECHOCARDIOGRAM  11/18/2010   grade 1 diastolic dysfunction, ef 92-11%/  trivial MR and TR/ mild dilated RA   Social History   Occupational History   Occupation: teaches constitutional law   Occupation: Designer, jewellery: [employer]  Tobacco Use   Smoking status: Never   Smokeless tobacco: Never  Substance and Sexual Activity   Alcohol use: Yes    Comment: social   Drug use: No   Sexual activity: Not on file

## 2021-12-16 ENCOUNTER — Encounter (HOSPITAL_BASED_OUTPATIENT_CLINIC_OR_DEPARTMENT_OTHER): Payer: Self-pay

## 2021-12-16 ENCOUNTER — Other Ambulatory Visit: Payer: Self-pay

## 2021-12-16 ENCOUNTER — Telehealth: Payer: Self-pay | Admitting: Surgical

## 2021-12-16 ENCOUNTER — Emergency Department (HOSPITAL_BASED_OUTPATIENT_CLINIC_OR_DEPARTMENT_OTHER)
Admission: EM | Admit: 2021-12-16 | Discharge: 2021-12-16 | Disposition: A | Discharge status: Home / Self Care | Payer: Medicare Other | Attending: Emergency Medicine | Admitting: Emergency Medicine

## 2021-12-16 DIAGNOSIS — S0003XA Contusion of scalp, initial encounter: Secondary | ICD-10-CM | POA: Diagnosis not present

## 2021-12-16 DIAGNOSIS — W01198A Fall on same level from slipping, tripping and stumbling with subsequent striking against other object, initial encounter: Secondary | ICD-10-CM | POA: Insufficient documentation

## 2021-12-16 DIAGNOSIS — W19XXXA Unspecified fall, initial encounter: Secondary | ICD-10-CM

## 2021-12-16 DIAGNOSIS — Z20822 Contact with and (suspected) exposure to covid-19: Secondary | ICD-10-CM | POA: Diagnosis not present

## 2021-12-16 DIAGNOSIS — S0990XA Unspecified injury of head, initial encounter: Secondary | ICD-10-CM | POA: Diagnosis present

## 2021-12-16 LAB — RESP PANEL BY RT-PCR (FLU A&B, COVID) ARPGX2
Influenza A by PCR: NEGATIVE
Influenza B by PCR: NEGATIVE
SARS Coronavirus 2 by RT PCR: NEGATIVE

## 2021-12-16 NOTE — ED Provider Notes (Signed)
[facility] EMERGENCY DEPT Provider Note   CSN: [id number] Arrival date & time: 12/16/21  1747     History Chief Complaint  Patient presents with   Fall    HPI Lee Nelson is a 81 y.o. male presenting for fall.  Patient states that 48 hours ago, he fell and hit his head in the dark.  He denies fevers chills nausea vomiting shortness breath.  Endorses muscle aches and feels that he had a viral upper respiratory infection that is now resolving.  He is ambulatory tolerating p.o. intake. He denies any headache.  He states that he called his PCP today who recommended he come to the emergency department and be evaluated.  No known sick contacts.  Does not use blood thinners.   Patient's recorded medical, surgical, social, medication list and allergies were reviewed in the Snapshot window as part of the initial history.   Review of Systems   Review of Systems  Constitutional:  Negative for chills and fever.  HENT:  Negative for ear pain and sore throat.   Eyes:  Negative for pain and visual disturbance.  Respiratory:  Negative for cough and shortness of breath.   Cardiovascular:  Negative for chest pain and palpitations.  Gastrointestinal:  Negative for abdominal pain and vomiting.  Genitourinary:  Negative for dysuria and hematuria.  Musculoskeletal:  Negative for arthralgias and back pain.  Skin:  Negative for color change and rash.  Neurological:  Negative for seizures and syncope.  All other systems reviewed and are negative.   Physical Exam Updated Vital Signs BP (!) 124/91   Pulse 70   Temp 98 F (36.7 C)   Resp 18   Ht 5\' 8"  (1.727 m)   Wt 52.2 kg   SpO2 94%   BMI 17.49 kg/m  Physical Exam Vitals and nursing note reviewed.  Constitutional:      General: He is not in acute distress.    Appearance: He is well-developed.  HENT:     Head: Normocephalic and atraumatic.  Eyes:     Conjunctiva/sclera: Conjunctivae normal.  Cardiovascular:     Rate  and Rhythm: Normal rate and regular rhythm.     Heart sounds: No murmur heard. Pulmonary:     Effort: Pulmonary effort is normal. No respiratory distress.     Breath sounds: Normal breath sounds.  Abdominal:     Palpations: Abdomen is soft.     Tenderness: There is no abdominal tenderness.  Musculoskeletal:        General: Signs of injury (Bruising to his superior scalp.  No deformity.) present. No swelling or deformity.     Cervical back: Neck supple.  Skin:    General: Skin is warm and dry.     Capillary Refill: Capillary refill takes less than 2 seconds.  Neurological:     Mental Status: He is alert.  Psychiatric:        Mood and Affect: Mood normal.      ED Course/ Medical Decision Making/ A&P    Procedures Procedures   Medications Ordered in ED Medications - No data to display Medical Decision Making:    Lee Nelson is a 81 y.o. male who presented to the ED today with a moderate mechanisma trauma, detailed above.    Patient's presentation is complicated by their history of advanced age.   Given this mechanism of trauma, a full physical exam was performed. Notably, patient was hemodynamically stable in no acute distress.  Currently ambulatory tolerating  p.o. intake.  No focal spinal tenderness, no focal joint pain..   Reviewed and confirmed nursing documentation for past medical history, family history, social history.    Initial Assessment/Plan:   This is a patient presenting with a moderate mechanism trauma.  As such, I have considered intracranial injuries including intracranial hemorrhage, intrathoracic injuries including blunt myocardial or blunt lung injury, blunt abdominal injuries including aortic dissection, bladder injury, spleen injury, liver injury and I have considered orthopedic injuries including extremity or spinal injury.  With the patient's presentation of moderate mechanism trauma but with an otherwise reassuring exam, there is no indication for  further objective evaluation at this time. Due to patient's advanced age, I did offer CT head, CT C-spine, chest x-ray, pelvis x-ray.  Patient declined.  He states that his symptoms are overall mild and unless he absolutely has to undergo further radiation, he would prefer not to.  He justifies this saying that he has a history of skin cancer and is worried about further ionizing radiation.  This is overall reasonable as he has a already completed a 48-hour observation window on his own. Patient ambulating, tolerating PO intake and in no acute distress stable for continued OP care and management. Supportive care reinforced and patient is overall well appearing in no acute distress stable for continued OP care and management.   Final Reassessment and Plan:   Patient's history of present on his physical exam findings are overall reassuring at this time.  No acute pathology appreciated, patient is ambulatory tolerating p.o. intake in no acute distress on reassessment.  Patient is electing to wait on further objective imaging given his overall well appearance.  Patient will follow-up with primary care provider, orthopedic team for reassessment in the outpatient setting.  Patient discharged with no further acute events.    Clinical Impression:  1. Fall, initial encounter      Discharge   Final Clinical Impression(s) / ED Diagnoses Final diagnoses:  Fall, initial encounter    Rx / DC Orders ED Discharge Orders     None         Tretha Sciara, MD 12/16/21 1932

## 2021-12-16 NOTE — Telephone Encounter (Signed)
Please see message below. Pt was in the office 12/13/2021 and has since had another fall.

## 2021-12-16 NOTE — Telephone Encounter (Signed)
Tried calling patient. No answer. LMVM needs to reach out to PCP regarding this.

## 2021-12-16 NOTE — Telephone Encounter (Signed)
Patient called stating that he had a pretty bad fall this past Saturday night. He fell backwards and landed on his butt and hit his head. His pelvic pain has increased since his previous fall and we didn't have any opening until 9/26 with [facility]. If he could get a call back if he needs to be seen sooner or if he could get some advice to help with the pain. CB # [phone]

## 2021-12-16 NOTE — ED Triage Notes (Addendum)
Pt states that he fell late night Saturday, injuring the top of his head. Pt states that he was looking for the light switch in the dark and got tripped up. Pt also fell on his bottom.  Denies LOC, no thinners.  Pt is ambulatory to triage room with cane independently. Pt also requested covid/flu swab.

## 2021-12-16 NOTE — Telephone Encounter (Signed)
Patient called advised he noticed he has a sore throat on the side of his throat. Patient said the side of his adam's apple hurt as well. Patient said he's not sure what it could be.  The number to contact patient is [phone] or [phone]

## 2021-12-17 NOTE — Telephone Encounter (Signed)
Patient returning a phone call to [facility] and would like a call back at [phone].

## 2021-12-17 NOTE — Telephone Encounter (Signed)
Patient will see Dr Marlou Sa Wednesday morning.

## 2021-12-17 NOTE — Telephone Encounter (Signed)
Patient called again, if he could get a call back asap. Thank you

## 2021-12-18 ENCOUNTER — Ambulatory Visit (INDEPENDENT_AMBULATORY_CARE_PROVIDER_SITE_OTHER): Payer: Medicare Other

## 2021-12-18 ENCOUNTER — Ambulatory Visit (INDEPENDENT_AMBULATORY_CARE_PROVIDER_SITE_OTHER): Payer: Medicare Other | Admitting: Orthopedic Surgery

## 2021-12-18 ENCOUNTER — Telehealth: Payer: Self-pay | Admitting: Orthopedic Surgery

## 2021-12-18 DIAGNOSIS — M7918 Myalgia, other site: Secondary | ICD-10-CM

## 2021-12-18 DIAGNOSIS — M542 Cervicalgia: Secondary | ICD-10-CM

## 2021-12-18 NOTE — Telephone Encounter (Signed)
Patient wants to know if Dr Marlou Sa would order a scan of his neck. He states he ws told that he had some stenosis in the neck.  Patient states he forgot to ask at this office visit.

## 2021-12-18 NOTE — Telephone Encounter (Signed)
Ordered

## 2021-12-18 NOTE — Telephone Encounter (Signed)
okay for C-spine MRI  based on mechanism of injury from his fall.  Neck MRI from 2019 showed mild stenosis.  He is trying to see if we can get this done because he is having increased neck pain after the fall.

## 2021-12-18 NOTE — Telephone Encounter (Signed)
Please see below. You saw this pt in the office today.

## 2021-12-21 ENCOUNTER — Encounter: Payer: Self-pay | Admitting: Orthopedic Surgery

## 2021-12-21 NOTE — Progress Notes (Signed)
Office Visit Note   Patient: Lee Nelson           Date of Birth: November 16, 1940           MRN: [id number] Visit Date: 12/18/2021 Requested by: Chilton Greathouse, MD [address] [place],  [place] [address] PCP: Chilton Greathouse, MD  Subjective: Chief Complaint  Patient presents with   Other     Back/hip pain    HPI: Lee Nelson is an 81 year old patient with both neck pain as well as bilateral hip pain following a fall.  He has had multiple falls recently.  He has been able to ambulate after the fall.  No loss of consciousness.  He was able to get up after he had a low-energy fall several days ago.  Reports some back pain and buttock pain but no groin pain.  Tylenol has helped.  He also reports some neck pain with some occasional radiculopathy.  He did hit his head and has known history of mild neck arthritis.              ROS: All systems reviewed are negative as they relate to the chief complaint within the history of present illness.  Patient denies  fevers or chills.   Assessment & Plan: Visit Diagnoses:  1. Buttock pain     Plan: Impression is low back pain with no acute fracture on plain radiographs.  No groin pain on exam.  I think this is something that will likely run its course.  No rami fracture or sacral fracture present.  The neck is potentially more symptomatic and concerning based on his mechanism of injury where his head did hit the wall.  Has a history of osteoporosis.  Prior MRI scans spine 4 years ago did show mild arthritis.  Repeat imaging indicated at this time based on mechanism of injury.  Follow-up after that study.  Follow-Up Instructions: No follow-ups on file.   Orders:  Orders Placed This Encounter  Procedures   XR HIPS BILAT W OR W/O PELVIS 3-4 VIEWS   XR Lumbar Spine 2-3 Views   No orders of the defined types were placed in this encounter.     Procedures: No procedures performed   Clinical Data: No additional findings.  Objective: Vital  Signs: There were no vitals taken for this visit.  Physical Exam:   Constitutional: Patient appears well-developed HEENT:  Head: Normocephalic Eyes:EOM are normal Neck: Normal range of motion Cardiovascular: Normal rate Pulmonary/chest: Effort normal Neurologic: Patient is alert Skin: Skin is warm Psychiatric: Patient has normal mood and affect   Ortho Exam: Ortho exam demonstrates mildly tender neck range of motion with flexion extension and rotation.  Motor or sensory function in the arms intact.  Patient has no groin pain with internal/external Tatian of either leg.  Does have some SI joint tenderness bilaterally as well as sciatic notch tenderness.  No trochanteric tenderness is present.  No masses lymphadenopathy or skin changes noted in the back or leg region.  Specialty Comments:  No specialty comments available.  Imaging: No results found.   PMFS History: Patient Active Problem List   Diagnosis Date Noted   SBO (small bowel obstruction) (HCC) 12/31/2019   Amaurosis fugax of right eye 08/25/2017   PSVT (paroxysmal supraventricular tachycardia) (HCC) 09/12/2015   Chest pain 05/03/2015   Dyspnea 05/03/2015   ALLERGIC RHINITIS 09/21/2007   G E R D 09/21/2007   SMOKE INHALATION 09/21/2007   OSTEOPOROSIS 01/28/2007   PALPITATIONS 01/28/2007  COUGH 01/28/2007   ALLERGY 01/28/2007   Past Medical History:  Diagnosis Date   Anxiety    Arthritis    Bladder calculus    BPH (benign prostatic hyperplasia)    Chronic constipation    Complication of anesthesia    " I had some coughing afterwards for a couple of days"--  per pt "perfers spinal anesthesia since general anesthesia congnitive issues when older"   Diverticulosis of colon    Dry eye syndrome of both eyes    Environmental allergies    GERD (gastroesophageal reflux disease)    occasional   History of adenomatous polyp of colon    08/ 2004   History of kidney stones    History of squamous cell carcinoma in  situ (SCCIS) of skin    s/p  excision 2013 facial areas and 06/ 2016 nose   Migraine    eye migraine occasional   Seasonal and perennial allergic rhinitis    Thrombocytopenia (HCC)    Tingling    hands and feet bilat , intermittantly-- per pt has lumbar bulging disk   Urinary frequency    Vocal fold atrophy    dysphonia-- per pt has to drink large amount of water to take even on pill   Wears glasses     Family History  Problem Relation Age of Onset   Parkinsonism Brother    COPD Mother    Allergies Mother    Heart failure Mother    COPD Father    Stroke Father    Other Sister    Suicidality Maternal Aunt    Cancer Maternal Grandfather     Past Surgical History:  Procedure Laterality Date   APPENDECTOMY  child   CARDIOVASCULAR STRESS TEST  05-17-2015  dr hilty   Low risk nuclear study w/ no ischemia/  normal LV function and wall motion , stress ef 54% (lvef 45-54%)   CHOLECYSTECTOMY N/A 09/29/2014   Procedure: LAPAROSCOPIC CHOLECYSTECTOMY WITH INTRAOPERATIVE CHOLANGIOGRAM;  Surgeon: Alphonsa Overall, MD;  Location: [facility];  Service: General;  Laterality: N/A;   COLONOSCOPY  last one 09-06-2010   ESOPHAGOGASTRODUODENOSCOPY N/A 12/09/2013   Procedure: ESOPHAGOGASTRODUODENOSCOPY (EGD);  Surgeon: Arta Silence, MD;  [facility] END[facility]COPY;  Service: Endoscopy;  Laterality: N/A;   EXTRACORPOREAL SHOCK WAVE LITHOTRIPSY  yrs ago   INGUINAL HERNIA REPAIR Left child   inguinal hernia repair  70/2637   NISSEN FUNDOPLICATION  8588'F   open   STONE EXTRACTION WITH BASKET N/A 08/18/2016   Procedure: STONE EXTRACTION WITH BASKET;  Surgeon: Carolan Clines, MD;  Location: [facility];  Service: Urology;  Laterality: N/A;   THULIUM LASER TURP (TRANSURETHRAL RESECTION OF PROSTATE) N/A 08/18/2016   Procedure: THULIUM LASER BLADDER NECK INCISION AND BLADDER STONE REMOVAL;  Surgeon: Carolan Clines, MD;  Location: [facility];  Service: Urology;  Laterality: N/A;    TONSILLECTOMY  child   TRANSTHORACIC ECHOCARDIOGRAM  11/18/2010   grade 1 diastolic dysfunction, ef 02-77%/  trivial MR and TR/ mild dilated RA   Social History   Occupational History   Occupation: teaches constitutional law   Occupation: Designer, jewellery: [employer]  Tobacco Use   Smoking status: Never   Smokeless tobacco: Never  Substance and Sexual Activity   Alcohol use: Yes    Comment: social   Drug use: No   Sexual activity: Not on file

## 2021-12-23 ENCOUNTER — Ambulatory Visit
Admission: RE | Admit: 2021-12-23 | Discharge: 2021-12-23 | Disposition: A | Discharge status: Home / Self Care | Payer: Medicare Other | Source: Ambulatory Visit | Attending: Orthopedic Surgery | Admitting: Orthopedic Surgery

## 2021-12-23 DIAGNOSIS — M542 Cervicalgia: Secondary | ICD-10-CM

## 2021-12-23 NOTE — Telephone Encounter (Signed)
I did call patient to discuss this last week and that he saw Dr. Marlou Sa the following day

## 2021-12-24 ENCOUNTER — Ambulatory Visit (INDEPENDENT_AMBULATORY_CARE_PROVIDER_SITE_OTHER): Payer: Medicare Other

## 2021-12-24 ENCOUNTER — Ambulatory Visit (INDEPENDENT_AMBULATORY_CARE_PROVIDER_SITE_OTHER): Payer: Medicare Other | Admitting: Surgical

## 2021-12-24 ENCOUNTER — Encounter: Payer: Self-pay | Admitting: Surgical

## 2021-12-24 DIAGNOSIS — M25551 Pain in right hip: Secondary | ICD-10-CM

## 2021-12-24 NOTE — Progress Notes (Signed)
Follow-up Office Visit Note   Patient: Lee Nelson           Date of Birth: 10/13/1940           MRN: [id number] Visit Date: 12/24/2021 Requested by: Chilton Greathouse, MD [address] [place],  [place] [address] PCP: Chilton Greathouse, MD  Subjective: Chief Complaint  Patient presents with   Right Hip - Pain    HPI: Lee Nelson is a 81 y.o. male who returns to the office for follow-up visit.    Plan at last visit was: Impression is low back pain with no acute fracture on plain radiographs.  No groin pain on exam.  I think this is something that will likely run its course.  No rami fracture or sacral fracture present.  The neck is potentially more symptomatic and concerning based on his mechanism of injury where his head did hit the wall.  Has a history of osteoporosis.  Prior MRI scans spine 4 years ago did show mild arthritis.  Repeat imaging indicated at this time based on mechanism of injury.  Follow-up after that study.  Since then, patient notes he has had worsening right hip and buttock pain.  Difficulty sleeping.  Pain is worse with getting up and sitting down.  Difficult to tie shoes due to pain with moving his hip around.  Worse with straightening his leg out at times.  Shifting around in bed makes him fairly miserable.  No radicular pain down the leg.  The majority of his pain he localizes to the right buttock.  Not really much in the way of low back pain.  No numbness or tingling except for a little bit of tingling in his toes.  Neck pain is vastly improved compared with his last visit with Dr. August Saucer.              ROS: All systems reviewed are negative as they relate to the chief complaint within the history of present illness.  Patient denies fevers or chills.  Assessment & Plan: Visit Diagnoses:  1. Pain in right hip     Plan: Lee Nelson is a 81 y.o. male who returns to the office for follow-up visit for buttock pain.  Plan from last visit was noted above in  HPI.  They now return with worsening buttock pain.  Had another fall on Saturday which has marked several falls the last several weeks.  He has a lot of excruciating pain primarily with palpation over the SI joint and the sacral body itself.  Has some discomfort with moving his hip around in the clinic today but he does not really have any significant difficulty bearing weight on the right lower extremity.  New radiographs taken today demonstrating his baseline hip arthritis and SI joint arthritis with no new fracture or dislocation noted.  Plan is to order MRI pelvis for further evaluation of occult sacral fracture.  May be dealing with similar problem he had in the past when he had previous MRI pelvis demonstrating sacral ala fractures that were bilateral and nondisplaced in June 2022.  Follow-Up Instructions: No follow-ups on file.   Orders:  Orders Placed This Encounter  Procedures   XR HIP UNILAT W OR W/O PELVIS 2-3 VIEWS RIGHT   MR Pelvis w/o contrast   No orders of the defined types were placed in this encounter.     Procedures: No procedures performed   Clinical Data: No additional findings.  Objective: Vital Signs: There  were no vitals taken for this visit.  Physical Exam:  Constitutional: Patient appears well-developed HEENT:  Head: Normocephalic Eyes:EOM are normal Neck: Normal range of motion Cardiovascular: Normal rate Pulmonary/chest: Effort normal Neurologic: Patient is alert Skin: Skin is warm Psychiatric: Patient has normal mood and affect  Ortho Exam: Ortho exam demonstrates right hip with increased pain with hip range of motion.  Positive FADIR sign.  Positive Stinchfield sign.  Does have some tenderness in the groin itself.  No tenderness over the greater trochanter of the right hip.  He has no tenderness over the ischial tuberosity and the hamstring tendon proximally is felt to be intact compared with the contralateral side.  He has equivalent strength of the  hamstring musculature when he was prone compared with contralateral side.  Does have some tenderness along the SI joint as well as the sacral body itself.  No tenderness over the ASIS.  Specialty Comments:  No specialty comments available.  Imaging: No results found.   PMFS History: Patient Active Problem List   Diagnosis Date Noted   SBO (small bowel obstruction) (HCC) 12/31/2019   Amaurosis fugax of right eye 08/25/2017   PSVT (paroxysmal supraventricular tachycardia) (HCC) 09/12/2015   Chest pain 05/03/2015   Dyspnea 05/03/2015   ALLERGIC RHINITIS 09/21/2007   G E R D 09/21/2007   SMOKE INHALATION 09/21/2007   OSTEOPOROSIS 01/28/2007   PALPITATIONS 01/28/2007   COUGH 01/28/2007   ALLERGY 01/28/2007   Past Medical History:  Diagnosis Date   Anxiety    Arthritis    Bladder calculus    BPH (benign prostatic hyperplasia)    Chronic constipation    Complication of anesthesia    " I had some coughing afterwards for a couple of days"--  per pt "perfers spinal anesthesia since general anesthesia congnitive issues when older"   Diverticulosis of colon    Dry eye syndrome of both eyes    Environmental allergies    GERD (gastroesophageal reflux disease)    occasional   History of adenomatous polyp of colon    08/ 2004   History of kidney stones    History of squamous cell carcinoma in situ (SCCIS) of skin    s/p  excision 2013 facial areas and 06/ 2016 nose   Migraine    eye migraine occasional   Seasonal and perennial allergic rhinitis    Thrombocytopenia (HCC)    Tingling    hands and feet bilat , intermittantly-- per pt has lumbar bulging disk   Urinary frequency    Vocal fold atrophy    dysphonia-- per pt has to drink large amount of water to take even on pill   Wears glasses     Family History  Problem Relation Age of Onset   Parkinsonism Brother    COPD Mother    Allergies Mother    Heart failure Mother    COPD Father    Stroke Father    Other Sister     Suicidality Maternal Aunt    Cancer Maternal Grandfather     Past Surgical History:  Procedure Laterality Date   APPENDECTOMY  child   CARDIOVASCULAR STRESS TEST  05-17-2015  dr hilty   Low risk nuclear study w/ no ischemia/  normal LV function and wall motion , stress ef 54% (lvef 45-54%)   CHOLECYSTECTOMY N/A 09/29/2014   Procedure: LAPAROSCOPIC CHOLECYSTECTOMY WITH INTRAOPERATIVE CHOLANGIOGRAM;  Surgeon: Ovidio Kin, MD;  Location: [facility];  Service: General;  Laterality: N/A;   COLONOSCOPY  last one 09-06-2010   ESOPHAGOGASTRODUODENOSCOPY N/A 12/09/2013   Procedure: ESOPHAGOGASTRODUODENOSCOPY (EGD);  Surgeon: Arta Silence, MD;  Location: [facility] ENDOSCOPY;  Service: Endoscopy;  Laterality: N/A;   EXTRACORPOREAL SHOCK WAVE LITHOTRIPSY  yrs ago   INGUINAL HERNIA REPAIR Left child   inguinal hernia repair  94/7654   NISSEN FUNDOPLICATION  6503'T   open   STONE EXTRACTION WITH BASKET N/A 08/18/2016   Procedure: STONE EXTRACTION WITH BASKET;  Surgeon: Carolan Clines, MD;  Location: [facility];  Service: Urology;  Laterality: N/A;   THULIUM LASER TURP (TRANSURETHRAL RESECTION OF PROSTATE) N/A 08/18/2016   Procedure: THULIUM LASER BLADDER NECK INCISION AND BLADDER STONE REMOVAL;  Surgeon: Carolan Clines, MD;  Location: [facility];  Service: Urology;  Laterality: N/A;   TONSILLECTOMY  child   TRANSTHORACIC ECHOCARDIOGRAM  11/18/2010   grade 1 diastolic dysfunction, ef 46-56%/  trivial MR and TR/ mild dilated RA   Social History   Occupational History   Occupation: teaches constitutional law   Occupation: Designer, jewellery: [employer]  Tobacco Use   Smoking status: Never   Smokeless tobacco: Never  Substance and Sexual Activity   Alcohol use: Yes    Comment: social   Drug use: No   Sexual activity: Not on file

## 2021-12-25 ENCOUNTER — Ambulatory Visit
Admission: RE | Admit: 2021-12-25 | Discharge: 2021-12-25 | Disposition: A | Discharge status: Home / Self Care | Payer: Medicare Other | Source: Ambulatory Visit | Attending: Surgical | Admitting: Surgical

## 2021-12-25 ENCOUNTER — Telehealth: Payer: Self-pay

## 2021-12-25 DIAGNOSIS — M25551 Pain in right hip: Secondary | ICD-10-CM

## 2021-12-25 NOTE — Telephone Encounter (Signed)
        Patient  visited [place]  9/18   Telephone encounter attempt :  1st  A HIPAA compliant voice message was left requesting a return call.  Instructed patient to call back    [place]  [phone] [address], [place], [place] [address] Phone: [phone] Email: [email or web address]

## 2021-12-26 ENCOUNTER — Telehealth: Payer: Self-pay

## 2021-12-26 NOTE — Telephone Encounter (Signed)
     Patient  visit on 9/18  at [place]   Have you been able to follow up with your primary care physician? yes  The patient was or was not able to obtain any needed medicine or equipment. yes  Are there diet recommendations that you are having difficulty following?na  Patient expresses understanding of discharge instructions and education provided has no other needs at this time. yes     Lee Nelson [facility], [place], Care Management  [phone] [address], , [place] [address] Phone: [phone] Email: [name]    

## 2021-12-30 ENCOUNTER — Ambulatory Visit: Payer: Medicare Other | Admitting: Physical Therapy

## 2022-01-01 ENCOUNTER — Telehealth: Payer: Self-pay

## 2022-01-01 NOTE — Progress Notes (Signed)
I called.

## 2022-01-01 NOTE — Telephone Encounter (Signed)
I spoke with Dr Marlou Sa and he will try to call patient and discuss results instead of having patient come in.

## 2022-01-01 NOTE — Progress Notes (Signed)
Lauren, I tried to call [place] a couple times over the weekend, could not leave a VM message.  Could you give him a call and get an appointment to revierw MRI with myself or Dr Marlou Sa?

## 2022-01-01 NOTE — Telephone Encounter (Signed)
-----   Message from Donella Stade, PA-C sent at 01/01/2022  1:03 PM EDT ----- Ander Purpura, I tried to call [facility] a couple times over the weekend, could not leave a VM message.  Could you give him a call and get an appointment to revierw MRI with myself or Dr Marlou Sa?

## 2022-01-13 ENCOUNTER — Ambulatory Visit: Payer: Medicare Other | Admitting: Physical Therapy

## 2022-01-31 ENCOUNTER — Encounter: Payer: Self-pay | Admitting: Internal Medicine

## 2022-01-31 ENCOUNTER — Ambulatory Visit: Payer: Medicare Other | Attending: Internal Medicine | Admitting: Internal Medicine

## 2022-01-31 VITALS — BP 102/56 | HR 52 | Ht 68.0 in | Wt 120.4 lb

## 2022-01-31 DIAGNOSIS — I471 Supraventricular tachycardia, unspecified: Secondary | ICD-10-CM | POA: Insufficient documentation

## 2022-01-31 DIAGNOSIS — R001 Bradycardia, unspecified: Secondary | ICD-10-CM | POA: Insufficient documentation

## 2022-01-31 DIAGNOSIS — I6522 Occlusion and stenosis of left carotid artery: Secondary | ICD-10-CM | POA: Insufficient documentation

## 2022-01-31 NOTE — Progress Notes (Signed)
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 Karsten Fells with [facility] vascular center until 2010. In the past he said complaints of chest pain and shortness of breath but underwent workup including stress test and echocardiogram which was negative. He also had some lower extremity leg pain concerning for claudication but had normal arterial Dopplers. Recently he's had some left chest pain and shortness of breath. He also feels some discomfort from time to time and almost on a daily basis some fluttering or what described as palpitations. He saw his primary care provider for this who referred him back to our office. He does not have a lot of traditional cardiac risk factors. He denies hypertension, diabetes or significant family history of coronary disease. He reports he generally been thin most of his life and has a low blood pressure and low heart rate at rest. He was referred for a lexiscan stress test and monitor. The stress test was negative for ischemia with an EF of 54%. He wore the 48 hour monitor which demonstrated an episode of PSVT. This was less than 10 seconds however could've made him symptomatic. This could explain why been feeling some palpitations. It did not appear to be atrial fibrillation or atrial flutter.  09/12/2015  Mr. [name] returns today for follow-up. He's been taking his low-dose of Toprol but reports some occasional dizziness and fatigue with exertion. He has run a low normal blood pressure and I was concerned about it possibly getting lower with the beta blocker. He does note that his ectopy has improved on the beta blocker, but the trade off side effects do not seem to be worth it.  08/25/2017  Mr. [name] is seen today in follow-up.  Overall he is doing well.  He came off of the Toprol because of  low blood pressure and he did not quite feel as well on it.  He reports some palpitations intermittently but generally is not bothered by them.  He recently had an episode of amaurosis fugax.  He had a work-up through Dr. Marjory Lies at [facility] neurologic and was thought to ultimately have migraine with aura.  He also had an ophthalmologic evaluation at [facility].  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  02/07/2020  Mr. [name] is seen today for routine follow-up.  He was seen by Azalee Course, PA-C over the summer and was having issues with leg pain.  He underwent arterial Dopplers which were negative for obstruction.  More recently he was hospitalized at [facility] for small bowel obstruction.  He said prior abdominal surgeries and it was felt that adhesions may be playing a role in it.  He is also had significant weight loss, today at 116 pounds with a BMI of 18.  He says that at home at times he can weigh as low as 105 pounds.  He does report trying to eat and gain weight but is losing weight unintentionally.  He does not have an upcoming follow-up with his PCP and is said it has been difficult to get in there.  Blood pressure today was low at 102/50.  He denies chest pain or shortness of breath.  He has had some leg swelling and is noted to have varicose veins bilaterally.  He also mentions some issues with possible memory loss and had questions about blood flow to his brain.  12/25/2020  Mr. [name] is seen today in follow-up.  Overall he seems to be doing fairly well.  His weight fortunately has stabilized somewhat.  Back in March it was down to 120 pounds and now he is weighing at 125 pounds.  He is still on the low end of normal weight.  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  He is also primarily caring for his wife.  He has not had follow-up with his PCP in the past year.  Last lipids showed LDL cholesterol of 64.  He denies chest pain or worsening shortness of breath.  He has no palpitations.  EKG shows a  sinus bradycardia at 51.  He is unaware whether his heart rate increases with exercise or not.  01/31/2022  Mr. [name] returns today for follow-up.  Overall he seems to have stabilized with regards to his weight.  He says his appetite is a little better.  He remains around 120 pounds but he says he was as low as 105 pounds.  Blood pressure is in the low normal range.  Nuys any worsening palpitations.  EKG today shows a normal sinus bradycardia.  He has some concerns about memory and wonders if blood flow is an issue.  He had carotid Dopplers back in 2020 which showed some very mild left carotid artery stenosis and no evidence in the right of any stenosis.  Those have not been repeated.  He had again pointed out some varicose veins in his lower extremities which are not causing him symptoms.  He had lower extremity arterial Dopplers in 2021 which were normal.  He has also had ultrasound for DVT subsequent to that which was negative.  PMHx:  Past Medical History:  Diagnosis Date   Anxiety    Arthritis    Bladder calculus    BPH (benign prostatic hyperplasia)    Chronic constipation    Complication of anesthesia    " I had some coughing afterwards for a couple of days"--  per pt "perfers spinal anesthesia since general anesthesia congnitive issues when older"   Diverticulosis of colon    Dry eye syndrome of both eyes    Environmental allergies    GERD (gastroesophageal reflux disease)    occasional   History of adenomatous polyp of colon    08/ 2004   History of kidney stones    History of squamous cell carcinoma in situ (SCCIS) of skin    s/p  excision 2013 facial areas and 06/ 2016 nose   Migraine    eye migraine occasional   Seasonal and perennial allergic rhinitis    Thrombocytopenia (HCC)    Tingling    hands and feet bilat , intermittantly-- per pt has lumbar bulging disk   Urinary frequency    Vocal fold atrophy    dysphonia-- per pt has to drink large amount of water to take  even on pill   Wears glasses     Past Surgical History:  Procedure Laterality Date   APPENDECTOMY  child   CARDIOVASCULAR STRESS TEST  05-17-2015  dr Jamis Kryder   Low risk nuclear study w/ no ischemia/  normal LV function and wall motion , stress ef 54% (lvef 45-54%)   CHOLECYSTECTOMY N/A 09/29/2014   Procedure: LAPAROSCOPIC CHOLECYSTECTOMY WITH INTRAOPERATIVE CHOLANGIOGRAM;  Surgeon: Ovidio Kin, MD;  Location: [facility];  Service: General;  Laterality: N/A;   COLONOSCOPY  last one 09-06-2010   ESOPHAGOGASTRODUODENOSCOPY N/A 12/09/2013   Procedure: ESOPHAGOGASTRODUODENOSCOPY (EGD);  Surgeon:  Willis Modena, MD;  Location: [facility] ENDOSCOPY;  Service: Endoscopy;  Laterality: N/A;   EXTRACORPOREAL SHOCK WAVE LITHOTRIPSY  yrs ago   INGUINAL HERNIA REPAIR Left child   inguinal hernia repair  09/2017   NISSEN FUNDOPLICATION  1980's   open   STONE EXTRACTION WITH BASKET N/A 08/18/2016   Procedure: STONE EXTRACTION WITH BASKET;  Surgeon: Jethro Bolus, MD;  Location: [facility];  Service: Urology;  Laterality: N/A;   THULIUM LASER TURP (TRANSURETHRAL RESECTION OF PROSTATE) N/A 08/18/2016   Procedure: THULIUM LASER BLADDER NECK INCISION AND BLADDER STONE REMOVAL;  Surgeon: Jethro Bolus, MD;  Location[facility];  Service: Urology;  Laterality: N/A;   TONSILLECTOMY  child   TRANSTHORACIC ECHOCARDIOGRAM  11/18/2010   grade 1 diastolic dysfunction, ef 55-60%/  trivial MR and TR/ mild dilated RA    FAMHx:  Family History  Problem Relation Age of Onset   Parkinsonism Brother    COPD Mother    Allergies Mother    Heart failure Mother    COPD Father    Stroke Father    Other Sister    Suicidality Maternal Aunt    Cancer Maternal Grandfather     SOCHx:   reports that he has never smoked. He has never used smokeless tobacco. He reports current alcohol use. He reports that he does not use drugs.  ALLERGIES:  Allergies  Allergen Reactions   Ciprofloxacin      JOINT PAIN   Flagyl [Metronidazole] Other (See Comments)    REACTION: no appetite, diarrhea after meal, decrease in weight   Metoclopramide Hcl Other (See Comments)    REACTION: "involuntary movements"   Other Other (See Comments)    Antibiotics have unknown reaction propophol causes memory problems   Propofol Other (See Comments)   Risperidone Other (See Comments)    "do not want"   Septra [Sulfamethoxazole-Trimethoprim] Other (See Comments)    REACTION: "involuntary movements" tripac antibiotic- heart rythm   Silodosin     ? Possibly allergy, could not breathe well out of nose   Soy Allergy Other (See Comments)    Stomach upset, "gas"    ROS: Pertinent items noted in HPI and remainder of comprehensive ROS otherwise negative.  HOME MEDS: Current Outpatient Medications  Medication Sig Dispense Refill   acetaminophen-codeine (TYLENOL #3) 300-30 MG tablet 1 po q 8 prn pain 30 tablet 0   Acetylcysteine (NAC) 500 MG CAPS Take by mouth.     B Complex Vitamins (VITAMIN B COMPLEX) TABS 1 tab     Cholecalciferol (VITAMIN D-3) 1000 units CAPS Take 2,000 Units by mouth daily.      Cranberry 400 MG CAPS Take 400-1,200 mg by mouth every morning.      docusate sodium (COLACE) 50 MG capsule Take 1 capsule (50 mg total) by mouth at bedtime. 10 capsule 0   MAGNESIUM CITRATE PO Take 75 mg by mouth daily.     Menaquinone-7 (VITAMIN K2 PO) Take 120-240 mg by mouth daily. Combo w/ Vit. D     NON FORMULARY Take 1 capsule by mouth daily. Acetyl L carnitine     OVER THE COUNTER MEDICATION Take 3 capsules by mouth 2 (two) times daily. Lion's mane 0.5 gram/capsule     OVER THE COUNTER MEDICATION Take 2 capsules by mouth every morning. Reparagen supplement     OVER THE COUNTER MEDICATION Melatonin patch     Phosphatidylserine 100 MG CAPS Take 100 mg by mouth every morning.  Probiotic Product (PROBIOTIC DAILY PO) Take 1 capsule by mouth daily.      Propylene Glycol (SYSTANE BALANCE) 0.6 % SOLN  Apply 1 drop to eye daily as needed (dry eyes).      sodium chloride (MURO 128) 5 % ophthalmic ointment Place 1 application into both eyes at bedtime.      sodium chloride (MURO 128) 5 % ophthalmic solution See admin instructions.     tadalafil (CIALIS) 5 MG tablet Take by mouth.     THEANINE PO Take 2 tablets by mouth 2 (two) times daily.      THIAMINE HCL PO Take by mouth.     UNABLE TO FIND Med Name: cocovia memory     vitamin C (ASCORBIC ACID) 250 MG tablet Take 250 mg by mouth daily.     No current facility-administered medications for this visit.    LABS/IMAGING: No results found for this or any previous visit (from the past 48 hour(s)). No results found.  WEIGHTS: Wt Readings from Last 3 Encounters:  01/31/22 120 lb 6.4 oz (54.6 kg)  12/16/21 115 lb (52.2 kg)  04/22/21 120 lb (54.4 kg)    VITALS: BP (!) 102/56 (BP Location: Left Arm, Patient Position: Sitting, Cuff Size: Small)   Pulse (!) 52   Ht 5\' 8"  (1.727 m)   Wt 120 lb 6.4 oz (54.6 kg)   SpO2 96%   BMI 18.31 kg/m   EXAM: General appearance: alert and no distress Neck: no carotid bruit, no JVD and thyroid not enlarged, symmetric, no tenderness/mass/nodules Lungs: clear to auscultation bilaterally Heart: regular rate and rhythm Abdomen: soft, non-tender; bowel sounds normal; no masses,  no organomegaly Extremities: extremities normal, atraumatic, no cyanosis or edema Pulses: 2+ and symmetric Skin: Skin color, texture, turgor normal. No rashes or lesions Neurologic: Grossly normal Psych: Pleasant  EKG: 52-personally reviewed  ASSESSMENT: Bilateral leg pain-suspect varicose veins, normal lower extremity arterial Dopplers History of PSVT Unintentional weight loss Recent small bowel obstruction Memory loss Vocal hoarseness Mild carotid stenosis  PLAN: 1.   Mr. Mezera seems to be stable without any worsening weight loss.  He is concerned about his ongoing memory loss issues.  He wonders if carotid artery  disease is an issue.  Had very mild left carotid artery stenosis in 2020.  We will go ahead and repeat those Dopplers.  He denies any recurrent SVT.  He does have varicose veins of lower extremities without any significant swelling and had negative arterial Dopplers.  Follow-up with me annually or sooner as necessary.  Pixie Casino, MD, [facility], [place] Director of the Advanced Lipid Disorders &  Cardiovascular Risk Reduction Clinic Diplomate of the American Board of Clinical Lipidology Attending Cardiologist  Direct Dial: [phone]  Fax: [phone]  Website:  [email or web address] 01/31/2022, 10:00 AM

## 2022-01-31 NOTE — Patient Instructions (Signed)
Medication Instructions:  NO CHANGES  *If you need a refill on your cardiac medications before your next appointment, please call your pharmacy*   Testing/Procedures: Carotid Doppler Testing at Dr. Lysbeth Penner office   Follow-Up: At [facility], you and your health needs are our priority.  As part of our continuing mission to provide you with exceptional heart care, we have created designated Provider Care Teams.  These Care Teams include your primary Cardiologist (physician) and Advanced Practice Providers (APPs -  Physician Assistants and Nurse Practitioners) who all work together to provide you with the care you need, when you need it.  We recommend signing up for the patient portal called "MyChart".  Sign up information is provided on this After Visit Summary.  MyChart is used to connect with patients for Virtual Visits (Telemedicine).  Patients are able to view lab/test results, encounter notes, upcoming appointments, etc.  Non-urgent messages can be sent to your provider as well.   To learn more about what you can do with MyChart, go to [email or web address].    Your next appointment:   12 month(s)  The format for your next appointment:   In Person  Provider:   Pixie Casino, MD   Please call in June/July for a November 2024 appointment

## 2022-02-04 ENCOUNTER — Ambulatory Visit (HOSPITAL_COMMUNITY)
Admission: RE | Admit: 2022-02-04 | Discharge: 2022-02-04 | Disposition: A | Discharge status: Home / Self Care | Payer: Medicare Other | Source: Ambulatory Visit | Attending: Internal Medicine | Admitting: Internal Medicine

## 2022-02-04 DIAGNOSIS — I6522 Occlusion and stenosis of left carotid artery: Secondary | ICD-10-CM | POA: Diagnosis present

## 2022-02-10 ENCOUNTER — Other Ambulatory Visit: Payer: Self-pay | Admitting: Gastroenterology

## 2022-02-10 DIAGNOSIS — R634 Abnormal weight loss: Secondary | ICD-10-CM

## 2022-02-10 DIAGNOSIS — R109 Unspecified abdominal pain: Secondary | ICD-10-CM

## 2022-02-12 ENCOUNTER — Inpatient Hospital Stay: Admission: RE | Admit: 2022-02-12 | Payer: Medicare Other | Source: Ambulatory Visit

## 2022-02-14 ENCOUNTER — Encounter: Payer: Self-pay | Admitting: Internal Medicine

## 2022-02-17 ENCOUNTER — Other Ambulatory Visit: Payer: Self-pay | Admitting: Geriatric Medicine

## 2022-02-17 DIAGNOSIS — F03A0 Unspecified dementia, mild, without behavioral disturbance, psychotic disturbance, mood disturbance, and anxiety: Secondary | ICD-10-CM

## 2022-03-08 ENCOUNTER — Ambulatory Visit
Admission: RE | Admit: 2022-03-08 | Discharge: 2022-03-08 | Disposition: A | Discharge status: Home / Self Care | Payer: Medicare Other | Source: Ambulatory Visit | Attending: Geriatric Medicine | Admitting: Geriatric Medicine

## 2022-03-08 DIAGNOSIS — F03A0 Unspecified dementia, mild, without behavioral disturbance, psychotic disturbance, mood disturbance, and anxiety: Secondary | ICD-10-CM

## 2022-04-14 ENCOUNTER — Ambulatory Visit (INDEPENDENT_AMBULATORY_CARE_PROVIDER_SITE_OTHER): Payer: Medicare Other | Admitting: Surgical

## 2022-04-14 ENCOUNTER — Ambulatory Visit (INDEPENDENT_AMBULATORY_CARE_PROVIDER_SITE_OTHER): Payer: Medicare Other

## 2022-04-14 DIAGNOSIS — M25551 Pain in right hip: Secondary | ICD-10-CM | POA: Diagnosis not present

## 2022-04-14 DIAGNOSIS — M545 Low back pain, unspecified: Secondary | ICD-10-CM

## 2022-04-17 ENCOUNTER — Encounter: Payer: Self-pay | Admitting: Surgical
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
## 2022-04-23 ENCOUNTER — Ambulatory Visit (INDEPENDENT_AMBULATORY_CARE_PROVIDER_SITE_OTHER): Payer: Medicare Other | Admitting: Physical Therapy

## 2022-04-23 ENCOUNTER — Encounter: Payer: Self-pay | Admitting: Physical Therapy

## 2022-04-23 ENCOUNTER — Other Ambulatory Visit: Payer: Self-pay

## 2022-04-23 DIAGNOSIS — M5459 Other low back pain: Secondary | ICD-10-CM | POA: Diagnosis not present

## 2022-04-23 DIAGNOSIS — Z9181 History of falling: Secondary | ICD-10-CM | POA: Diagnosis not present

## 2022-04-23 DIAGNOSIS — M6281 Muscle weakness (generalized): Secondary | ICD-10-CM | POA: Diagnosis not present
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
## 2022-04-30 ENCOUNTER — Encounter: Payer: Self-pay | Admitting: Rehabilitative and Restorative Service Providers"

## 2022-04-30 ENCOUNTER — Ambulatory Visit (INDEPENDENT_AMBULATORY_CARE_PROVIDER_SITE_OTHER): Payer: Medicare Other | Admitting: Rehabilitative and Restorative Service Providers"

## 2022-04-30 DIAGNOSIS — R2681 Unsteadiness on feet: Secondary | ICD-10-CM

## 2022-04-30 DIAGNOSIS — M6281 Muscle weakness (generalized): Secondary | ICD-10-CM | POA: Diagnosis not present

## 2022-04-30 DIAGNOSIS — Z9181 History of falling: Secondary | ICD-10-CM

## 2022-04-30 DIAGNOSIS — R296 Repeated falls: Secondary | ICD-10-CM

## 2022-04-30 DIAGNOSIS — M5459 Other low back pain: Secondary | ICD-10-CM | POA: Diagnosis not present

## 2022-04-30 DIAGNOSIS — R2689 Other abnormalities of gait and mobility: Secondary | ICD-10-CM

## 2022-04-30 NOTE — Therapy (Addendum)
OUTPATIENT PHYSICAL THERAPY TREATMENT /DISCHARGE   Patient Name: Lee Nelson MRN: [id number] DOB:September 06, 1940, 82 y.o., male Today's Date: 04/30/2022  END OF SESSION:  PT End of Session - 04/30/22 1104     Visit Number 2    Number of Visits 20    Date for PT Re-Evaluation 07/04/22    Progress Note Due on Visit 10    PT Start Time 1055    PT Stop Time 1134    PT Time Calculation (min) 39 min    Activity Tolerance Patient tolerated treatment well    Behavior During Therapy WFL for tasks assessed/performed              Past Medical History:  Diagnosis Date   Anxiety    Arthritis    Bladder calculus    BPH (benign prostatic hyperplasia)    Chronic constipation    Complication of anesthesia    " I had some coughing afterwards for a couple of days"--  per pt "perfers spinal anesthesia since general anesthesia congnitive issues when older"   Diverticulosis of colon    Dry eye syndrome of both eyes    Environmental allergies    GERD (gastroesophageal reflux disease)    occasional   History of adenomatous polyp of colon    08/ 2004   History of kidney stones    History of squamous cell carcinoma in situ (SCCIS) of skin    s/p  excision 2013 facial areas and 06/ 2016 nose   Migraine    eye migraine occasional   Seasonal and perennial allergic rhinitis    Thrombocytopenia (HCC)    Tingling    hands and feet bilat , intermittantly-- per pt has lumbar bulging disk   Urinary frequency    Vocal fold atrophy    dysphonia-- per pt has to drink large amount of water to take even on pill   Wears glasses    Past Surgical History:  Procedure Laterality Date   APPENDECTOMY  child   CARDIOVASCULAR STRESS TEST  05-17-2015  dr hilty   Low risk nuclear study w/ no ischemia/  normal LV function and wall motion , stress ef 54% (lvef 45-54%)   CHOLECYSTECTOMY N/A 09/29/2014   Procedure: LAPAROSCOPIC CHOLECYSTECTOMY WITH INTRAOPERATIVE CHOLANGIOGRAM;  Surgeon: Alphonsa Overall, MD;   Location: [facility];  Service: General;  Laterality: N/A;   COLONOSCOPY  last one 09-06-2010   ESOPHAGOGASTRODUODENOSCOPY N/A 12/09/2013   Procedure: ESOPHAGOGASTRODUODENOSCOPY (EGD);  Surgeon: Arta Silence, MD;  Location: [facility] ENDOSCOPY;  Service: Endoscopy;  Laterality: N/A;   EXTRACORPOREAL SHOCK WAVE LITHOTRIPSY  yrs ago   INGUINAL HERNIA REPAIR Left child   inguinal hernia repair  [id number]   NISSEN FUNDOPLICATION  [id number]   open   STONE EXTRACTION WITH BASKET N/A 08/18/2016   Procedure: STONE EXTRACTION WITH BASKET;  Surgeon: Carolan Clines, MD;  Location: [facility];  Service: Urology;  Laterality: N/A;   THULIUM LASER TURP (TRANSURETHRAL RESECTION OF PROSTATE) N/A 08/18/2016   Procedure: THULIUM LASER BLADDER NECK INCISION AND BLADDER STONE REMOVAL;  Surgeon: Carolan Clines, MD;  Location: [facility];  Service: Urology;  Laterality: N/A;   TONSILLECTOMY  child   TRANSTHORACIC ECHOCARDIOGRAM  11/18/2010   grade 1 diastolic dysfunction, ef [id number]  trivial MR and TR/ mild dilated RA   Patient Active Problem List   Diagnosis Date Noted   SBO (small bowel obstruction) ([place]) 12/31/2019   Amaurosis fugax of right eye 08/25/2017   PSVT (paroxysmal supraventricular  tachycardia) 09/12/2015   Chest pain 05/03/2015   Dyspnea 05/03/2015   ALLERGIC RHINITIS 09/21/2007   G E R D 09/21/2007   SMOKE INHALATION 09/21/2007   OSTEOPOROSIS 01/28/2007   PALPITATIONS 01/28/2007   COUGH 01/28/2007   ALLERGY 01/28/2007    PCP: Prince Solian, MD   REFERRING PROVIDER: Donella Stade, PA-C   REFERRING DIAG: M54.50 (ICD-10-CM) - Low back pain, unspecified back pain laterality, unspecified chronicity, unspecified whether sciatica present  Rationale for Evaluation and Treatment: Rehabilitation  THERAPY DIAG:  Other low back pain  Muscle weakness (generalized)  History of falling  Unsteadiness on feet  Repeated falls  Balance disorder  ONSET  DATE: years ago  SUBJECTIVE:                                                                                                                                                                                           SUBJECTIVE STATEMENT: He indicated he was hurrying today and didn't bring the cane.  He mentioned no specific pain upon arrival today.  Reported some stretching   PERTINENT HISTORY:  Dyspnea, chest pain, PSVT, Osteoporosis, allergies, GERD, arthritis anxiety  PAIN:  NPRS scale: 3/10 Pain location: low back Pain description: achy, discomfort Aggravating factors: bending and lifting when assisting his wife Relieving factors: rest, occasional over the counter pain med  PRECAUTIONS: FALL  WEIGHT BEARING RESTRICTIONS: No  FALLS:  Has patient fallen in last 6 months? Yes. Number of falls 3  LIVING ENVIRONMENT: Lives with: lives with their family and lives with their spouse Lives in: House/apartmentSingle point cane Stairs: pt has ramp in back, and has a couple of steps to enter his front door.  Has following equipment at home:   OCCUPATION: retired Barista professor at [employer]: "I want to improve my posture and get stronger"     OBJECTIVE:   DIAGNOSTIC FINDINGS:  Result Narrative 04/17/22  AP and lateral views of the lumbar spine reviewed.  Moderate degenerative changes noted throughout the facet joints of the lumbar spine, particularly inferior lumbar spine.  Disc space narrowing is noted.  No acute fracture.  No significant spondylolisthesis.  PATIENT SURVEYS:  04/23/22: FOTO eval: 54% (predicted 62%)  SCREENING FOR RED FLAGS: Bowel or bladder incontinence: No Cauda equina syndrome: No  COGNITION: 04/23/22 Overall cognitive status: WFL normal      SENSATION: 04/23/22 [facility]  MUSCLE LENGTH: 04/23/22 Hamstrings: Right 58 deg; Left 64 deg   POSTURE: 04/23/22 rounded shoulders and forward head, forward trunk  flexion, decreased lumbar lordosis  PALPATION: 04/23/22 TTP: lumbar paraspinals, QL on Rt  LUMBAR ROM:   AROM 04/23/22  Flexion 40 CGA to prevent LOB  Extension 5  Right lateral flexion 18  Left lateral flexion 14  Right rotation Limited 75%  Left rotation Limited  75 %   (Blank rows = not tested)  LOWER EXTREMITY ROM:     Active  Right Eval 04/23/22 Left Eval 04/23/22  Hip flexion 105 108  Hip extension    Hip abduction    Hip adduction    Hip internal rotation    Hip external rotation    Knee flexion    Knee extension     (Blank rows = not tested)  LOWER EXTREMITY MMT:    MMT Right 04/23/22 Left 04/23/22  Hip flexion 4/5 4/5  Hip extension    Hip abduction 4/5 4/5  Hip adduction 4/5 4/5  Hip internal rotation    Hip external rotation    Knee flexion 5/5 5/5  Knee extension 5/5 5/5  Ankle dorsiflexion 5/5 5/5  Ankle plantarflexion    Ankle inversion    Ankle eversion     (Blank rows = not tested)  LUMBAR SPECIAL TESTS:  04/23/22: Straight leg raise test: Negative and Slump test: Negative  FUNCTIONAL TESTS:  04/30/2022:     04/30/22 0001  Balance  Balance Assessed Yes  Standardized Balance Assessment  Standardized Balance Assessment Berg Balance Test  Berg Balance Test  Sit to Stand 4  Standing Unsupported 4  Sitting with Back Unsupported but Feet Supported on Floor or Stool 4  Stand to Sit 4  Transfers 4  Standing Unsupported with Eyes Closed 3  Standing Unsupported with Feet Together 3  From Standing, Reach Forward with Outstretched Arm 3  From Standing Position, Pick up Object from Floor 4  From Standing Position, Turn to Look Behind Over each Shoulder 2  Turn 360 Degrees 4  Standing Unsupported, Alternately Place Feet on Step/Stool 2  Standing Unsupported, One Foot in Front 3  Standing on One Leg 1  Total Score 45    04/23/22: 5 times sit to stand: 19 seconds no UE support  GAIT: 04/23/22 Distance walked: 30 feet on level surfaces from  gym to elevator Assistive device utilized: Single point cane Level of assistance: Modified independence Comments: shuffling gait pattern with forward flexed trunk  TODAY'S TREATMENT:                                                                                                        DATE: 04/30/2022  Therex: Nustep UE/LE lvl 5 8 mins Supine lumbar trunk rotation 20 sec x 3 bilateral Standing green 2 x 15 bilateral rows Supine marching 2 x 10 Supine slr 2 x 10 bilateral Additional time spent in review of HEP for techniques.   Physical Performance Testing: Additional time spent in performance of BERG balance testing c verbal and visual instruction for performance  TODAY'S TREATMENT:  DATE: 04/23/22  Therex:    HEP instruction/performance c cues for techniques, handout provided.  Trial set performed of each for comprehension and symptom assessment.  See below for exercise list  PATIENT EDUCATION:  Education details: HEP, POC Person educated: Patient Education method: Explanation, Demonstration, Verbal cues, and Handouts Education comprehension: verbalized understanding, returned demonstration, and verbal cues required  HOME EXERCISE PROGRAM: Access Code: [email or web address] URL: [email or web address] Date: 04/23/2022 Prepared by: Kearney Hard  Exercises - Supine Lower Trunk Rotation  - 2 x daily - 7 x weekly - 3 reps - 20 seconds hold - Standing Shoulder Row with Anchored Resistance  - 2 x daily - 7 x weekly - 15 reps - Supine March  - 2 x daily - 7 x weekly - 20 reps - Supine Active Straight Leg Raise  - 2 x daily - 7 x weekly - 10 reps  ASSESSMENT:  CLINICAL IMPRESSION: Berg balance testing at 45.  Pt required cues for adjustment to remind of HEP techniques.  Pt to continue to benefit from mobility and postural strengthening as well as balance program to improve  presentation and daily mobility tolerance.   OBJECTIVE IMPAIRMENTS: decreased activity tolerance, decreased balance, decreased mobility, difficulty walking, decreased ROM, and decreased strength.   ACTIVITY LIMITATIONS: bending, sitting, standing, sleeping, stairs, and transfers  PARTICIPATION LIMITATIONS: community activity  PERSONAL FACTORS: 3+ comorbidities: see above pertinent history  are also affecting patient's functional outcome.   REHAB POTENTIAL: Good  CLINICAL DECISION MAKING: Stable/uncomplicated  EVALUATION COMPLEXITY: Low   GOALS: Goals reviewed with patient? Yes  SHORT TERM GOALS: (target date for Short term goals are 3 weeks 05/16/22)  1. Patient will demonstrate independent use of home exercise program to maintain progress from in clinic treatments.  Goal status: on going 04/30/2022  LONG TERM GOALS: (target dates for all long term goals are 10 weeks  07/04/22 )   1. Patient will demonstrate/report pain at worst less than or equal to 2/10 to facilitate minimal limitation in daily activity secondary to pain symptoms.  Goal status: New   2. Patient will demonstrate independent use of home exercise program to facilitate ability to maintain/progress functional gains from skilled physical therapy services.  Goal status: New   3. Patient will demonstrate FOTO outcome > or = 62 % to indicate reduced disability due to condition.  Goal status: New   4. Pt will be able to lift 4 pounds from counter to over head shelf without LOB and no pain reported in his low back.   Goal status: New   5.  Pt will be able to improve his 5 time sit to stand to </= 14 seconds with no UE support.  Goal status: New   6.  Perform BERG balance test and improve score by >/= 5 points from baseline to decrease fall risk.  Goal status: New   PLAN:  PT FREQUENCY: 1-2x/week  PT DURATION: 10 weeks  PLANNED INTERVENTIONS: Therapeutic exercises, Therapeutic activity, Neuro Muscular  re-education, Balance training, Gait training, Patient/Family education, Joint mobilization, Stair training, DME instructions, Dry Needling, Electrical stimulation, Cryotherapy, vasopneumatic device, Moist heat, Taping, Traction Ultrasound, Ionotophoresis '4mg'$ /ml Dexamethasone, and Manual therapy.  All included unless contraindicated  PLAN FOR NEXT SESSION:  Review HEP as necessary.  Include static balance holds and progression as tolerated.   Scot Jun, PT, DPT, OCS, ATC 04/30/22  11:54 AM  PHYSICAL THERAPY DISCHARGE SUMMARY  Visits from Start of Care: 2  Current functional level related to goals / functional  outcomes: See note   Remaining deficits: See note   Education / Equipment: HEP  Patient goals were not met. Patient is being discharged due to not returning since the last visit.  Scot Jun, PT, DPT, OCS, ATC 06/04/22  11:19 AM

## 2022-05-06 ENCOUNTER — Emergency Department (HOSPITAL_COMMUNITY): Payer: Medicare Other

## 2022-05-06 ENCOUNTER — Encounter (HOSPITAL_COMMUNITY): Payer: Self-pay

## 2022-05-06 ENCOUNTER — Inpatient Hospital Stay (HOSPITAL_COMMUNITY)
Admission: EM | Admit: 2022-05-06 | Discharge: 2022-05-15 | DRG: 871 | Disposition: A | Discharge status: Skilled Nursing Facility | Payer: Medicare Other | Attending: Family Medicine | Admitting: Family Medicine

## 2022-05-06 ENCOUNTER — Other Ambulatory Visit: Payer: Self-pay

## 2022-05-06 DIAGNOSIS — J3089 Other allergic rhinitis: Secondary | ICD-10-CM | POA: Diagnosis present

## 2022-05-06 DIAGNOSIS — R6520 Severe sepsis without septic shock: Secondary | ICD-10-CM | POA: Diagnosis not present

## 2022-05-06 DIAGNOSIS — D696 Thrombocytopenia, unspecified: Secondary | ICD-10-CM | POA: Diagnosis present

## 2022-05-06 DIAGNOSIS — G309 Alzheimer's disease, unspecified: Secondary | ICD-10-CM | POA: Diagnosis present

## 2022-05-06 DIAGNOSIS — F0154 Vascular dementia, unspecified severity, with anxiety: Secondary | ICD-10-CM | POA: Diagnosis present

## 2022-05-06 DIAGNOSIS — E876 Hypokalemia: Secondary | ICD-10-CM | POA: Diagnosis present

## 2022-05-06 DIAGNOSIS — N401 Enlarged prostate with lower urinary tract symptoms: Secondary | ICD-10-CM | POA: Diagnosis present

## 2022-05-06 DIAGNOSIS — F0390 Unspecified dementia without behavioral disturbance: Secondary | ICD-10-CM | POA: Diagnosis not present

## 2022-05-06 DIAGNOSIS — J690 Pneumonitis due to inhalation of food and vomit: Secondary | ICD-10-CM | POA: Diagnosis present

## 2022-05-06 DIAGNOSIS — R490 Dysphonia: Secondary | ICD-10-CM | POA: Diagnosis present

## 2022-05-06 DIAGNOSIS — K5909 Other constipation: Secondary | ICD-10-CM | POA: Diagnosis present

## 2022-05-06 DIAGNOSIS — F0284 Dementia in other diseases classified elsewhere, unspecified severity, with anxiety: Secondary | ICD-10-CM | POA: Diagnosis present

## 2022-05-06 DIAGNOSIS — K219 Gastro-esophageal reflux disease without esophagitis: Secondary | ICD-10-CM | POA: Diagnosis present

## 2022-05-06 DIAGNOSIS — J9601 Acute respiratory failure with hypoxia: Secondary | ICD-10-CM | POA: Diagnosis not present

## 2022-05-06 DIAGNOSIS — Z681 Body mass index (BMI) 19 or less, adult: Secondary | ICD-10-CM

## 2022-05-06 DIAGNOSIS — G912 (Idiopathic) normal pressure hydrocephalus: Secondary | ICD-10-CM | POA: Diagnosis present

## 2022-05-06 DIAGNOSIS — U071 COVID-19: Secondary | ICD-10-CM | POA: Diagnosis not present

## 2022-05-06 DIAGNOSIS — H04123 Dry eye syndrome of bilateral lacrimal glands: Secondary | ICD-10-CM | POA: Diagnosis present

## 2022-05-06 DIAGNOSIS — Z79899 Other long term (current) drug therapy: Secondary | ICD-10-CM

## 2022-05-06 DIAGNOSIS — R531 Weakness: Secondary | ICD-10-CM

## 2022-05-06 DIAGNOSIS — R5381 Other malaise: Secondary | ICD-10-CM | POA: Diagnosis not present

## 2022-05-06 DIAGNOSIS — Z86010 Personal history of colonic polyps: Secondary | ICD-10-CM

## 2022-05-06 DIAGNOSIS — I471 Supraventricular tachycardia, unspecified: Secondary | ICD-10-CM | POA: Diagnosis present

## 2022-05-06 DIAGNOSIS — A419 Sepsis, unspecified organism: Secondary | ICD-10-CM | POA: Diagnosis not present

## 2022-05-06 DIAGNOSIS — I700 Atherosclerosis of aorta: Secondary | ICD-10-CM | POA: Diagnosis present

## 2022-05-06 DIAGNOSIS — Z85828 Personal history of other malignant neoplasm of skin: Secondary | ICD-10-CM

## 2022-05-06 DIAGNOSIS — W06XXXA Fall from bed, initial encounter: Secondary | ICD-10-CM | POA: Diagnosis present

## 2022-05-06 DIAGNOSIS — J159 Unspecified bacterial pneumonia: Secondary | ICD-10-CM | POA: Diagnosis present

## 2022-05-06 DIAGNOSIS — Z66 Do not resuscitate: Secondary | ICD-10-CM | POA: Diagnosis present

## 2022-05-06 DIAGNOSIS — Z888 Allergy status to other drugs, medicaments and biological substances status: Secondary | ICD-10-CM

## 2022-05-06 DIAGNOSIS — Z86008 Personal history of in-situ neoplasm of other site: Secondary | ICD-10-CM

## 2022-05-06 DIAGNOSIS — M1990 Unspecified osteoarthritis, unspecified site: Secondary | ICD-10-CM | POA: Diagnosis present

## 2022-05-06 DIAGNOSIS — J1282 Pneumonia due to coronavirus disease 2019: Secondary | ICD-10-CM | POA: Diagnosis not present

## 2022-05-06 DIAGNOSIS — Z91018 Allergy to other foods: Secondary | ICD-10-CM

## 2022-05-06 DIAGNOSIS — A4189 Other specified sepsis: Principal | ICD-10-CM | POA: Diagnosis present

## 2022-05-06 DIAGNOSIS — Z9049 Acquired absence of other specified parts of digestive tract: Secondary | ICD-10-CM

## 2022-05-06 DIAGNOSIS — R636 Underweight: Secondary | ICD-10-CM | POA: Diagnosis present

## 2022-05-06 DIAGNOSIS — Z881 Allergy status to other antibiotic agents status: Secondary | ICD-10-CM

## 2022-05-06 DIAGNOSIS — N179 Acute kidney failure, unspecified: Secondary | ICD-10-CM | POA: Diagnosis present

## 2022-05-06 DIAGNOSIS — Z9079 Acquired absence of other genital organ(s): Secondary | ICD-10-CM

## 2022-05-06 DIAGNOSIS — M810 Age-related osteoporosis without current pathological fracture: Secondary | ICD-10-CM | POA: Diagnosis present

## 2022-05-06 LAB — CBC WITH DIFFERENTIAL/PLATELET
Abs Immature Granulocytes: 0.01 10*3/uL (ref 0.00–0.07)
Basophils Absolute: 0 10*3/uL (ref 0.0–0.1)
Basophils Relative: 0 %
Eosinophils Absolute: 0 10*3/uL (ref 0.0–0.5)
Eosinophils Relative: 0 %
HCT: 40.9 % (ref 39.0–52.0)
Hemoglobin: 13.8 g/dL (ref 13.0–17.0)
Immature Granulocytes: 0 %
Lymphocytes Relative: 5 %
Lymphs Abs: 0.4 10*3/uL — ABNORMAL LOW (ref 0.7–4.0)
MCH: 32 pg (ref 26.0–34.0)
MCHC: 33.7 g/dL (ref 30.0–36.0)
MCV: 94.9 fL (ref 80.0–100.0)
Monocytes Absolute: 1 10*3/uL (ref 0.1–1.0)
Monocytes Relative: 11 %
Neutro Abs: 7.4 10*3/uL (ref 1.7–7.7)
Neutrophils Relative %: 84 %
Platelets: 103 10*3/uL — ABNORMAL LOW (ref 150–400)
RBC: 4.31 MIL/uL (ref 4.22–5.81)
RDW: 13.1 % (ref 11.5–15.5)
WBC: 8.8 10*3/uL (ref 4.0–10.5)
nRBC: 0 % (ref 0.0–0.2)

## 2022-05-06 LAB — BASIC METABOLIC PANEL
Anion gap: 11 (ref 5–15)
BUN: 23 mg/dL (ref 8–23)
CO2: 23 mmol/L (ref 22–32)
Calcium: 8.5 mg/dL — ABNORMAL LOW (ref 8.9–10.3)
Chloride: 101 mmol/L (ref 98–111)
Creatinine, Ser: 0.74 mg/dL (ref 0.61–1.24)
GFR, Estimated: >60 mL/min (ref >60)
Glucose, Bld: 104 mg/dL — ABNORMAL HIGH (ref 70–99)
Potassium: 3.5 mmol/L (ref 3.5–5.1)
Sodium: 135 mmol/L (ref 135–145)

## 2022-05-06 LAB — RESP PANEL BY RT-PCR (RSV, FLU A&B, COVID)  RVPGX2
Influenza A by PCR: NEGATIVE
Influenza B by PCR: NEGATIVE
Resp Syncytial Virus by PCR: NEGATIVE
SARS Coronavirus 2 by RT PCR: POSITIVE — AB

## 2022-05-06 LAB — LACTIC ACID, PLASMA: Lactic Acid, Venous: 1.2 mmol/L (ref 0.5–1.9)

## 2022-05-06 LAB — BRAIN NATRIURETIC PEPTIDE: B Natriuretic Peptide: 68.4 pg/mL (ref 0.0–100.0)

## 2022-05-06 LAB — CBG MONITORING, ED: Glucose-Capillary: 116 mg/dL — ABNORMAL HIGH (ref 70–99)

## 2022-05-06 MED ORDER — SODIUM CHLORIDE 0.9 % IV SOLN: INTRAVENOUS | Status: AC

## 2022-05-06 MED ORDER — SODIUM CHLORIDE 0.9 % IV SOLN
100.0000 mg | Freq: Every day | INTRAVENOUS | Status: AC
  Administered 2022-05-07 – 2022-05-08 (×2): 100 mg via INTRAVENOUS
  Filled 2022-05-06 (×2): qty 20

## 2022-05-06 MED ORDER — DEXAMETHASONE SODIUM PHOSPHATE 10 MG/ML IJ SOLN
6.0000 mg | INTRAMUSCULAR | Status: DC
  Administered 2022-05-06 – 2022-05-12 (×7): 6 mg via INTRAVENOUS
  Filled 2022-05-06 (×7): qty 1

## 2022-05-06 MED ORDER — IOHEXOL 350 MG/ML SOLN
80.0000 mL | Freq: Once | INTRAVENOUS | Status: AC | PRN
  Administered 2022-05-06: 80 mL via INTRAVENOUS

## 2022-05-06 MED ORDER — PAXLOVID (300/100) 20 X 150 MG & 10 X 100MG PO TBPK: 3.0000 | ORAL_TABLET | Freq: Two times a day (BID) | ORAL | 0 refills | Status: DC

## 2022-05-06 MED ORDER — ENOXAPARIN SODIUM 40 MG/0.4ML IJ SOSY
40.0000 mg | PREFILLED_SYRINGE | INTRAMUSCULAR | Status: DC
  Administered 2022-05-06 – 2022-05-14 (×9): 40 mg via SUBCUTANEOUS
  Filled 2022-05-06 (×9): qty 0.4

## 2022-05-06 MED ORDER — SODIUM CHLORIDE 0.9 % IV SOLN
200.0000 mg | Freq: Once | INTRAVENOUS | Status: AC
  Administered 2022-05-06: 200 mg via INTRAVENOUS
  Filled 2022-05-06 (×2): qty 40

## 2022-05-06 MED ORDER — SODIUM CHLORIDE 0.9 % IV SOLN
3.0000 g | Freq: Four times a day (QID) | INTRAVENOUS | Status: DC
  Administered 2022-05-06 – 2022-05-13 (×27): 3 g via INTRAVENOUS
  Filled 2022-05-06 (×28): qty 8

## 2022-05-06 MED ORDER — SODIUM CHLORIDE 0.9 % IV BOLUS
500.0000 mL | Freq: Once | INTRAVENOUS | Status: AC
  Administered 2022-05-06: 500 mL via INTRAVENOUS

## 2022-05-06 MED ORDER — ACETAMINOPHEN 650 MG RE SUPP
650.0000 mg | Freq: Four times a day (QID) | RECTAL | Status: DC | PRN
  Administered 2022-05-06 – 2022-05-14 (×6): 650 mg via RECTAL
  Filled 2022-05-06 (×6): qty 1

## 2022-05-06 NOTE — ED Triage Notes (Signed)
COVID+ yesterday. This morning c/o generalized weakness, sore throat, fever, "slipped out of bed this morning". PCP wanted him to come to ED prior to fall. Baseline ambulatory with or w/o walker.

## 2022-05-06 NOTE — Progress Notes (Signed)
Patient is now be admitted, TOC will continue to follow patient.

## 2022-05-06 NOTE — Progress Notes (Signed)
82 year old M with PMH of dementia, osteoporosis, PSVT, GERD, thrombocytopenia, ambulatory dysfunction who tested positive for COVID yesterday and advised to go to ED by PCP due to generalized weakness, sore throat, fever and cough.  Reportedly slipped out of the bed.  Per EDP note, patient has been nonambulatory lately.  In ED, stable vitals.  Saturating in 90s on RA.  Basic labs including BMP and CBC without significant finding other than thrombocytopenia (chronic).  CXR without acute finding.  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  EDP discussed CT head findings with neurology who recommended outpatient follow-up.  Hospitalist service consulted for admission for COVID infection and generalized weakness.  At this time, patient does not merit treatment for COVID-19 without hypoxia or infiltrate on chest x-ray.  Advised therapy and social work consult for placement.  May consider goal of care discussion with family.

## 2022-05-06 NOTE — ED Provider Notes (Signed)
[place] EMERGENCY DEPARTMENT AT [facility] Provider Note  CSN: [id number] Arrival date & time: 05/06/22 6945  Chief Complaint(s) Weakness  HPI ARYE WEYENBERG is a 82 y.o. male with past medical history as below, significant for BPH, vascular dementia, Alzheimer's dementia, thrombocytopenia, vocal cord atrophy who presents to the ED with complaint of weakness, fall.  Patient is a poor historian.  Reports that he has been more shaky than normal and fell out of bed this morning.  Thinks he may have struck his head when he fell to the ground.  No LOC, no thinners.  Complaining of pain to his mid to low back.  No incontinence or overflow to bowel/urine.  No recent medication or diet changes.  Cough is worsened over the past few days, increasing sputum production.  Questionable fever, some chills.  Denies nausea vomiting.  No abdominal pain or chest pain.  No significant DIB. Weakness worsened from baseline. Uses walker.   Reports worsening pain to his mid back since this morning after the fall but thinks it may have been provoked by the ambulance ride rather than the actual fall, he is unsure  Level 5 caveat dementia  Spoke with patient's spouse, reports that she is unable to care for him, he is unable to get out of bed or ambulate at his typical baseline.  Difficulty coughing or clearing his mucus.  Past Medical History Past Medical History:  Diagnosis Date   Anxiety    Arthritis    Bladder calculus    BPH (benign prostatic hyperplasia)    Chronic constipation    Complication of anesthesia    " I had some coughing afterwards for a couple of days"--  per pt "perfers spinal anesthesia since general anesthesia congnitive issues when older"   Diverticulosis of colon    Dry eye syndrome of both eyes    Environmental allergies    GERD (gastroesophageal reflux disease)    occasional   History of adenomatous polyp of colon    08/ 2004   History of kidney stones    History of  squamous cell carcinoma in situ (SCCIS) of skin    s/p  excision 2013 facial areas and 06/ 2016 nose   Migraine    eye migraine occasional   Seasonal and perennial allergic rhinitis    Thrombocytopenia (HCC)    Tingling    hands and feet bilat , intermittantly-- per pt has lumbar bulging disk   Urinary frequency    Vocal fold atrophy    dysphonia-- per pt has to drink large amount of water to take even on pill   Wears glasses    Patient Active Problem List   Diagnosis Date Noted   SBO (small bowel obstruction) (HCC) 12/31/2019   Amaurosis fugax of right eye 08/25/2017   PSVT (paroxysmal supraventricular tachycardia) 09/12/2015   Chest pain 05/03/2015   Dyspnea 05/03/2015   ALLERGIC RHINITIS 09/21/2007   G E R D 09/21/2007   SMOKE INHALATION 09/21/2007   OSTEOPOROSIS 01/28/2007   PALPITATIONS 01/28/2007   COUGH 01/28/2007   ALLERGY 01/28/2007   Home Medication(s) Prior to Admission medications   Medication Sig Start Date End Date Taking? Authorizing Provider  nirmatrelvir & ritonavir (PAXLOVID, 300/100,) 20 x 150 MG & 10 x 100MG  TBPK Take 3 tablets by mouth 2 (two) times daily for 5 days. 05/06/22 05/11/22 Yes 07/10/22 A, DO  acetaminophen-codeine (TYLENOL #3) 300-30 MG tablet 1 po q 8 prn pain 08/03/20   10/03/20,  Tonna Corner, MD  Acetylcysteine (NAC) 500 MG CAPS Take by mouth.    [provider]  B Complex Vitamins (VITAMIN B COMPLEX) TABS 1 tab    [provider]  Cholecalciferol (VITAMIN D-3) 1000 units CAPS Take 2,000 Units by mouth daily.     [provider]  Cranberry 400 MG CAPS Take 400-1,200 mg by mouth every morning.     [provider]  docusate sodium (COLACE) 50 MG capsule Take 1 capsule (50 mg total) by mouth at bedtime. 01/05/20   Antonieta Pert, MD  MAGNESIUM CITRATE PO Take 75 mg by mouth daily.    [provider]  Menaquinone-7 (VITAMIN K2 PO) Take 120-240 mg by mouth daily. Combo w/ Vit. D    [provider]  NON  FORMULARY Take 1 capsule by mouth daily. Acetyl L carnitine    [provider]  OVER THE COUNTER MEDICATION Take 3 capsules by mouth 2 (two) times daily. Lion's mane 0.5 gram/capsule    [provider]  OVER THE COUNTER MEDICATION Take 2 capsules by mouth every morning. Reparagen supplement    [provider]  OVER THE COUNTER MEDICATION Melatonin patch    [provider]  Phosphatidylserine 100 MG CAPS Take 100 mg by mouth every morning.     [provider]  Probiotic Product (PROBIOTIC DAILY PO) Take 1 capsule by mouth daily.     [provider]  Propylene Glycol (SYSTANE BALANCE) 0.6 % SOLN Apply 1 drop to eye daily as needed (dry eyes).     [provider]  sodium chloride (MURO 128) 5 % ophthalmic ointment Place 1 application into both eyes at bedtime.     [provider]  sodium chloride (MURO 128) 5 % ophthalmic solution See admin instructions. 09/01/20   [provider]  tadalafil (CIALIS) 5 MG tablet Take by mouth.    [provider]  THEANINE PO Take 2 tablets by mouth 2 (two) times daily.     [provider]  THIAMINE HCL PO Take by mouth.    [provider]  UNABLE TO FIND Med Name: cocovia memory    [provider]  vitamin C (ASCORBIC ACID) 250 MG tablet Take 250 mg by mouth daily.    [provider]                                                                                                                                    Past Surgical History Past Surgical History:  Procedure Laterality Date   APPENDECTOMY  child   CARDIOVASCULAR STRESS TEST  05-17-2015  dr hilty   Low risk nuclear study w/ no ischemia/  normal LV function and wall motion , stress ef 54% (lvef 45-54%)   CHOLECYSTECTOMY N/A 09/29/2014   Procedure: LAPAROSCOPIC CHOLECYSTECTOMY WITH INTRAOPERATIVE CHOLANGIOGRAM;  Surgeon: Alphonsa Overall, MD;  Location: [facility];  Service: General;  Laterality:  N/A;   COLONOSCOPY  last one 09-06-2010   ESOPHAGOGASTRODUODENOSCOPY N/A 12/09/2013   Procedure: ESOPHAGOGASTRODUODENOSCOPY (EGD);  Surgeon: Willis Modena, MD;  Location: [facility] ENDOSCOPY;  Service: Endoscopy;  Laterality: N/A;   EXTRACORPOREAL SHOCK WAVE LITHOTRIPSY  yrs ago   INGUINAL HERNIA REPAIR Left child   inguinal hernia repair  09/2017   NISSEN FUNDOPLICATION  1980's   open   STONE EXTRACTION WITH BASKET N/A 08/18/2016   Procedure: STONE EXTRACTION WITH BASKET;  Surgeon: Jethro Bolus, MD;  Location: [facility];  Service: Urology;  Laterality: N/A;   THULIUM LASER TURP (TRANSURETHRAL RESECTION OF PROSTATE) N/A 08/18/2016   Procedure: THULIUM LASER BLADDER NECK INCISION AND BLADDER STONE REMOVAL;  Surgeon: Jethro Bolus, MD;  Location: [facility];  Service: Urology;  Laterality: N/A;   TONSILLECTOMY  child   TRANSTHORACIC ECHOCARDIOGRAM  11/18/2010   grade 1 diastolic dysfunction, ef 55-60%/  trivial MR and TR/ mild dilated RA   Family History Family History  Problem Relation Age of Onset   Parkinsonism Brother    COPD Mother    Allergies Mother    Heart failure Mother    COPD Father    Stroke Father    Other Sister    Suicidality Maternal Aunt    Cancer Maternal Grandfather     Social History Social History   Tobacco Use   Smoking status: Never   Smokeless tobacco: Never  Substance Use Topics   Alcohol use: Yes    Comment: social   Drug use: No   Allergies Ciprofloxacin, Flagyl [metronidazole], Metoclopramide hcl, Other, Propofol, Risperidone, Septra [sulfamethoxazole-trimethoprim], Silodosin, and Soy allergy  Review of Systems Review of Systems  Unable to perform ROS: Dementia  Constitutional:  Positive for fatigue.  HENT:  Positive for congestion, postnasal drip and rhinorrhea.   Respiratory:  Positive for cough.   Musculoskeletal:  Positive for arthralgias and back pain.    Physical Exam Vital Signs  I have  reviewed the triage vital signs BP (!) 158/51   Pulse 96   Temp (!) 102.6 F (39.2 C) (Rectal)   Resp (!) 21   Ht 5\' 8"  (1.727 m)   Wt 55 kg   SpO2 94%   BMI 18.44 kg/m  Physical Exam Vitals and nursing note reviewed.  Constitutional:      General: He is not in acute distress.    Appearance: He is well-developed. He is not diaphoretic.     Comments: Frail  HENT:     Head: Normocephalic and atraumatic. No raccoon eyes, Battle's sign, right periorbital erythema or left periorbital erythema.     Jaw: There is normal jaw occlusion.     Comments: Bitemporal wasting    Right Ear: External ear normal.     Left Ear: External ear normal.     Mouth/Throat:     Mouth: Mucous membranes are moist.  Eyes:     General: No scleral icterus.    Extraocular Movements: Extraocular movements intact.     Pupils: Pupils are equal, round, and reactive to light.  Cardiovascular:     Rate and Rhythm: Normal rate and regular rhythm.     Pulses: Normal pulses.     Heart sounds: Normal heart sounds.  Pulmonary:     Effort: Pulmonary effort is normal. No respiratory distress.     Breath sounds: Normal breath sounds.  Abdominal:     General: Abdomen is flat.     Palpations: Abdomen is soft.  Tenderness: There is no abdominal tenderness.  Musculoskeletal:       Arms:     Cervical back: No rigidity.     Right lower leg: No edema.     Left lower leg: No edema.     Comments: No significant pain with logroll to lower extremities LE NVI   Skin:    General: Skin is warm and dry.     Capillary Refill: Capillary refill takes less than 2 seconds.  Neurological:     Mental Status: He is alert and oriented to person, place, and time.     GCS: GCS eye subscore is 4. GCS verbal subscore is 5. GCS motor subscore is 6.     Cranial Nerves: Cranial nerves 2-12 are intact.     Sensory: Sensation is intact.     Motor: Motor function is intact.     Coordination: Coordination is intact.     Comments: Gait  not tested secondary to patient safety, typically uses walker  Psychiatric:        Mood and Affect: Mood normal.        Behavior: Behavior normal.     ED Results and Treatments Labs (all labs ordered are listed, but only abnormal results are displayed) Labs Reviewed  RESP PANEL BY RT-PCR (RSV, FLU A&B, COVID)  RVPGX2 - Abnormal; Notable for the following components:      Result Value   SARS Coronavirus 2 by RT PCR POSITIVE (*)    All other components within normal limits  BASIC METABOLIC PANEL - Abnormal; Notable for the following components:   Glucose, Bld 104 (*)    Calcium 8.5 (*)    All other components within normal limits  CBC WITH DIFFERENTIAL/PLATELET - Abnormal; Notable for the following components:   Platelets 103 (*)    Lymphs Abs 0.4 (*)    All other components within normal limits  CBG MONITORING, ED - Abnormal; Notable for the following components:   Glucose-Capillary 116 (*)    All other components within normal limits  BRAIN NATRIURETIC PEPTIDE                                                                                                                          Radiology CT Cervical Spine Wo Contrast  Result Date: 05/06/2022 CLINICAL DATA:  Neck trauma. EXAM: CT CERVICAL SPINE WITHOUT CONTRAST TECHNIQUE: Multidetector CT imaging of the cervical spine was performed without intravenous contrast. Multiplanar CT image reconstructions were also generated. RADIATION DOSE REDUCTION: This exam was performed according to the departmental dose-optimization program which includes automated exposure control, adjustment of the mA and/or kV according to patient size and/or use of iterative reconstruction technique. COMPARISON:  None Available. FINDINGS: Alignment: Normal. Skull base and vertebrae: No acute fracture. No primary bone lesion or focal pathologic process. Soft tissues and spinal canal: No prevertebral fluid or swelling. No visible canal hematoma. Disc levels:   Maintained Upper chest: Biapical pleural/parenchymal changes most likely scarring. IMPRESSION:  No acute traumatic abnormalities. Electronically Signed   By: Sammie Bench M.D.   On: 05/06/2022 12:46   DG Chest Portable 1 View  Result Date: 05/06/2022 CLINICAL DATA:  Cough, weakness, and fever.  COVID positive. EXAM: PORTABLE CHEST 1 VIEW COMPARISON:  Chest x-ray dated April 19, 2017. FINDINGS: The heart size and mediastinal contours are within normal limits. Normal pulmonary vascularity. Chronic biapical pleural-parenchymal scarring again noted. No focal consolidation, pleural effusion, or pneumothorax. No acute osseous abnormality. IMPRESSION: No active disease. Electronically Signed   By: Titus Dubin M.D.   On: 05/06/2022 10:13   CT Thoracic Spine Wo Contrast  Result Date: 05/06/2022 CLINICAL DATA:  [place] out of bed.  COVID positive. EXAM: CT THORACIC AND LUMBAR SPINE WITHOUT CONTRAST TECHNIQUE: Multidetector CT imaging of the thoracic and lumbar spine was performed without contrast. Multiplanar CT image reconstructions were also generated. RADIATION DOSE REDUCTION: This exam was performed according to the departmental dose-optimization program which includes automated exposure control, adjustment of the mA and/or kV according to patient size and/or use of iterative reconstruction technique. COMPARISON:  Lumbar spine x-rays dated April 14, 2022. MRI lumbar spine dated July 15, 2020. Chest x-ray dated April 19, 2017. FINDINGS: CT THORACIC SPINE FINDINGS Alignment: Normal. Vertebrae: No acute fracture or focal pathologic process. Osteopenia. Paraspinal and other soft tissues: Biapical pleuroparenchymal scarring. Right upper lobe bronchiectasis. Disc levels: Disc heights are relatively preserved. Minimal degenerative disc disease in the midthoracic spine. CT LUMBAR SPINE FINDINGS Segmentation: 5 lumbar type vertebrae. Alignment: Mild dextroscoliosis.  No significant listhesis. Vertebrae: No acute  fracture or focal pathologic process. Old healed fracture of the right L2 transverse process. Osteopenia. Paraspinal and other soft tissues: Aortoiliac atherosclerotic vascular disease. 5.1 cm simple cyst in the left liver. Few punctate calculi in the lower pole of the left kidney. Disc levels: Mild disc bulging at L4-L5 and L5-S1. Mild bilateral facet arthropathy from L3-L4 through L5-S1. IMPRESSION: 1. No acute osseous abnormality of the thoracic or lumbar spine. 2. Nonobstructive left nephrolithiasis. 3.  Aortic Atherosclerosis (ICD10-I70.0). Electronically Signed   By: Titus Dubin M.D.   On: 05/06/2022 10:08   CT Lumbar Spine Wo Contrast  Result Date: 05/06/2022 CLINICAL DATA:  [place] out of bed.  COVID positive. EXAM: CT THORACIC AND LUMBAR SPINE WITHOUT CONTRAST TECHNIQUE: Multidetector CT imaging of the thoracic and lumbar spine was performed without contrast. Multiplanar CT image reconstructions were also generated. RADIATION DOSE REDUCTION: This exam was performed according to the departmental dose-optimization program which includes automated exposure control, adjustment of the mA and/or kV according to patient size and/or use of iterative reconstruction technique. COMPARISON:  Lumbar spine x-rays dated April 14, 2022. MRI lumbar spine dated July 15, 2020. Chest x-ray dated April 19, 2017. FINDINGS: CT THORACIC SPINE FINDINGS Alignment: Normal. Vertebrae: No acute fracture or focal pathologic process. Osteopenia. Paraspinal and other soft tissues: Biapical pleuroparenchymal scarring. Right upper lobe bronchiectasis. Disc levels: Disc heights are relatively preserved. Minimal degenerative disc disease in the midthoracic spine. CT LUMBAR SPINE FINDINGS Segmentation: 5 lumbar type vertebrae. Alignment: Mild dextroscoliosis.  No significant listhesis. Vertebrae: No acute fracture or focal pathologic process. Old healed fracture of the right L2 transverse process. Osteopenia. Paraspinal and other soft  tissues: Aortoiliac atherosclerotic vascular disease. 5.1 cm simple cyst in the left liver. Few punctate calculi in the lower pole of the left kidney. Disc levels: Mild disc bulging at L4-L5 and L5-S1. Mild bilateral facet arthropathy from L3-L4 through L5-S1. IMPRESSION: 1. No acute osseous abnormality  of the thoracic or lumbar spine. 2. Nonobstructive left nephrolithiasis. 3.  Aortic Atherosclerosis (ICD10-I70.0). Electronically Signed   By: Titus Dubin M.D.   On: 05/06/2022 10:08   CT Head Wo Contrast  Result Date: 05/06/2022 CLINICAL DATA:  Head trauma. EXAM: CT HEAD WITHOUT CONTRAST TECHNIQUE: Contiguous axial images were obtained from the base of the skull through the vertex without intravenous contrast. RADIATION DOSE REDUCTION: This exam was performed according to the departmental dose-optimization program which includes automated exposure control, adjustment of the mA and/or kV according to patient size and/or use of iterative reconstruction technique. COMPARISON:  None Available. FINDINGS: Brain: There is periventricular white matter decreased attenuation consistent with small vessel ischemic changes. Ventricles, sulci and cisterns are prominent consistent with age related involutional changes. No acute intracranial hemorrhage, mass effect or shift. There is ventricular prominence is out of proportion to the extent of involutional changes suggesting normal pressure hydrocephalus. Vascular: No hyperdense vessel or unexpected calcification. Skull: Normal. Negative for fracture or focal lesion. Sinuses/Orbits: There is a splenoid cyst or polyp measuring 1.1 cm. Mucoperiosteal thickening consistent with chronic maxillary and ethmoid sinusitis. IMPRESSION: 1. Atrophy and chronic small vessel ischemic changes. 2. Findings suggest normal pressure hydrocephalus. 3. Sequelae of chronic sinusitis. Electronically Signed   By: Sammie Bench M.D.   On: 05/06/2022 09:56    Pertinent labs & imaging results  that were available during my care of the patient were reviewed by me and considered in my medical decision making (see MDM for details).  Medications Ordered in ED Medications  sodium chloride 0.9 % bolus 500 mL (0 mLs Intravenous Stopped 05/06/22 1300)  iohexol (OMNIPAQUE) 350 MG/ML injection 80 mL (80 mLs Intravenous Contrast Given 05/06/22 2008)                                                                                                                                     Procedures Procedures  (including critical care time)  Medical Decision Making / ED Course    Medical Decision Making:    ARAM DOMZALSKI is a 82 y.o. male  with past medical history as below, significant for BPH, vascular dementia, Alzheimer's dementia, thrombocytopenia, vocal cord atrophy who presents to the ED with complaint of weakness, fall.. The complaint involves an extensive differential diagnosis and also carries with it a high risk of complications and morbidity.  Serious etiology was considered. Ddx includes but is not limited to: Differential diagnoses for head trauma includes subdural hematoma, epidural hematoma, acute concussion, traumatic subarachnoid hemorrhage, cerebral contusions, metabolic derangement, infectious, etc.   Complete initial physical exam performed, notably the patient  was he is resting comfortably on ambient air, no acute distress.  Neuro is nonfocal.    Reviewed and confirmed nursing documentation for past medical history, family history, social history.  Vital signs reviewed.    Clinical Course as of 05/06/22 2008  Tue May 06, 2022  1523  Spoke with neurology Dr. Wilford CornerArora, recommends outpatient neurosurgery follow-up, he feels the scan is unchanged from prior. [SG]  [id number] Patient found to be hypoxic to mid 80s on RA in s/o covid. Now satting 92% on 3L [place]. Consulted to medicine for admission. [HN]  1933 D/w hospitalist who recommends CT PE given clear CXR.  [HN]    Clinical Course  User Index [HN] Loetta RoughNaasz, Hayley N, MD [SG] Sloan LeiterGray, Shaquisha Wynn A, DO   Patient here with weakness, recent minor fall, coughing, congestion, URI symptoms.  Positive for COVID-19.  Labs are stable otherwise.  He is not hypoxic but is having cough difficulty clearing his secretions.  Chest x-ray was fortunately stable.  Does appear to have elevated aspiration risk.  CT imaging of the spine reviewed and stable.  CT head was reviewed and is concern for possible normal pressure hydrocephalus.  Will discuss with neurology.  Patient has history of dementia. Per neuro recommend o/p f/u with NSGY, nothing to do from inpatient perspective. See ed course.  Pt unable to ambulate, he has covid, lives with spouse who is wheelchair bound, unable to help him at home with ADL's. D/w hospitalist - unable to offer admission TRH recommend TOC/SW eval for SNF placement. PT consult  Following d/w hospitalist pt found to be hypoxic. Plan for admission, incoming EDP to d/w hospitalist in regards to above     Additional history obtained: -Additional history obtained from family -External records from outside source obtained and reviewed including: Chart review including previous notes, labs, imaging, consultation notes including care documentation, prior labs and imaging, home medications   Lab Tests: -I ordered, reviewed, and interpreted labs.   The pertinent results include:   Labs Reviewed  RESP PANEL BY RT-PCR (RSV, FLU A&B, COVID)  RVPGX2 - Abnormal; Notable for the following components:      Result Value   SARS Coronavirus 2 by RT PCR POSITIVE (*)    All other components within normal limits  BASIC METABOLIC PANEL - Abnormal; Notable for the following components:   Glucose, Bld 104 (*)    Calcium 8.5 (*)    All other components within normal limits  CBC WITH DIFFERENTIAL/PLATELET - Abnormal; Notable for the following components:   Platelets 103 (*)    Lymphs Abs 0.4 (*)    All other components within normal  limits  CBG MONITORING, ED - Abnormal; Notable for the following components:   Glucose-Capillary 116 (*)    All other components within normal limits  BRAIN NATRIURETIC PEPTIDE    Notable for covid +tive  EKG   EKG Interpretation  Date/Time:  Tuesday May 06 2022 09:03:15 EST Ventricular Rate:  91 PR Interval:  148 QRS Duration: 110 QT Interval:  374 QTC Calculation: 461 R Axis:   97 Text Interpretation: Sinus rhythm Probable left atrial enlargement Right axis deviation simialr to prev Interpretation limited secondary to artifact no stemi Confirmed by Tanda RockersGray, Mignonne Afonso ([id number]) on 05/06/2022 9:18:38 AM         Imaging Studies ordered: I ordered imaging studies including CXR, CTH, CT spine I independently visualized the following imaging with scope of interpretation limited to determining acute life threatening conditions related to emergency care: CTH w/ ?NPH I independently visualized and interpreted imaging. I agree with the radiologist interpretation   Medicines ordered and prescription drug management: Meds ordered this encounter  Medications   sodium chloride 0.9 % bolus 500 mL   nirmatrelvir & ritonavir (PAXLOVID, 300/100,) 20 x 150 MG & 10 x 100MG   TBPK    Sig: Take 3 tablets by mouth 2 (two) times daily for 5 days.    Dispense:  30 tablet    Refill:  0   iohexol (OMNIPAQUE) 350 MG/ML injection 80 mL    -I have reviewed the patients home medicines and have made adjustments as needed   Consultations Obtained: I requested consultation with the neurology,  and discussed lab and imaging findings as well as pertinent plan - they recommend: o/p f/u with nsgy   Cardiac Monitoring: The patient was maintained on a cardiac monitor.  I personally viewed and interpreted the cardiac monitored which showed an underlying rhythm of: nsr  Social Determinants of Health:  Diagnosis or treatment significantly limited by social determinants of health:  dementia   Reevaluation: After the interventions noted above, I reevaluated the patient and found that they have stayed the same  Co morbidities that complicate the patient evaluation  Past Medical History:  Diagnosis Date   Anxiety    Arthritis    Bladder calculus    BPH (benign prostatic hyperplasia)    Chronic constipation    Complication of anesthesia    " I had some coughing afterwards for a couple of days"--  per pt "perfers spinal anesthesia since general anesthesia congnitive issues when older"   Diverticulosis of colon    Dry eye syndrome of both eyes    Environmental allergies    GERD (gastroesophageal reflux disease)    occasional   History of adenomatous polyp of colon    08/ 2004   History of kidney stones    History of squamous cell carcinoma in situ (SCCIS) of skin    s/p  excision 2013 facial areas and 06/ 2016 nose   Migraine    eye migraine occasional   Seasonal and perennial allergic rhinitis    Thrombocytopenia (HCC)    Tingling    hands and feet bilat , intermittantly-- per pt has lumbar bulging disk   Urinary frequency    Vocal fold atrophy    dysphonia-- per pt has to drink large amount of water to take even on pill   Wears glasses       Dispostion: Disposition decision including need for hospitalization was considered, and patient dispo pending at shift change    Final Clinical Impression(s) / ED Diagnoses Final diagnoses:  COVID-19  Weakness     This chart was dictated using voice recognition software.  Despite best efforts to proofread,  errors can occur which can change the documentation meaning.    Sloan LeiterGray, Michaiah Maiden A, DO 05/06/22 2009

## 2022-05-06 NOTE — Progress Notes (Signed)
Pharmacy Antibiotic Note  Lee Nelson is a 82 y.o. male admitted on 05/06/2022 with aspiration pneumonia.  Patient with recent Covid diagnosis.  CT shows right lower lobe aspiration or pneumonia.  Pharmacy has been consulted for Unasyn dosing.  Plan: Unasyn 3gm IV q6h Need for further dosage adjustment appears unlikely at present.    Will sign off at this time.  Please reconsult if a change in clinical status warrants re-evaluation of dosage.    Height: 5\' 8"  (172.7 cm) Weight: 55 kg (121 lb 4.1 oz) IBW/kg (Calculated) : 68.4  Temp (24hrs), Avg:100.1 F (37.8 C), Min:98.9 F (37.2 C), Max:102.6 F (39.2 C)  Recent Labs  Lab 05/06/22 0955  WBC 8.8  CREATININE 0.74    Estimated Creatinine Clearance: 56.3 mL/min (by C-G formula based on SCr of 0.74 mg/dL).    Allergies  Allergen Reactions   Ciprofloxacin     JOINT PAIN   Flagyl [Metronidazole] Other (See Comments)    REACTION: no appetite, diarrhea after meal, decrease in weight   Metoclopramide Hcl Other (See Comments)    REACTION: "involuntary movements"   Other Other (See Comments)    Antibiotics have unknown reaction propophol causes memory problems   Propofol Other (See Comments)   Risperidone Other (See Comments)    "do not want"   Septra [Sulfamethoxazole-Trimethoprim] Other (See Comments)    REACTION: "involuntary movements" tripac antibiotic- heart rythm   Silodosin     ? Possibly allergy, could not breathe well out of nose   Soy Allergy Other (See Comments)    Stomach upset, "gas"     Thank you for allowing pharmacy to be a part of this patient's care.  Everette Rank, PharmD 05/06/2022 9:20 PM

## 2022-05-06 NOTE — ED Provider Notes (Signed)
6:37 PM Assumed care of patient from off-going team. For more details, please see note from same day.  In brief, this is a 82 y.o. male, for more info please see note from same day. Covid postiive. CTH suggest normal pressure hydrocephalus. CXR no abnormalities. Unable to walk or complete ADLs.  Plan/Dispo at time of sign-out & ED Course since sign-out: SW/TOC eval for SNF placement w/ o/p NSGY f/u per neurology  BP (!) 125/103   Pulse 92   Temp (!) 102.6 F (39.2 C) (Rectal)   Resp (!) 21   Ht 5' 8"$  (1.727 m)   Wt 55 kg   SpO2 93%   BMI 18.44 kg/m    ED Course:   Clinical Course as of 05/17/22 1748  Tue May 06, 2022  1523 Spoke with neurology Dr. Rory Percy, recommends outpatient neurosurgery follow-up, he feels the scan is unchanged from prior. [SG]  [id number] Patient found to be hypoxic to mid 80s on RA in s/o covid. Now satting 92% on 3L [place]. Consulted to medicine for admission. [HN]  1933 D/w hospitalist who recommends CT PE given clear CXR.  [HN]    Clinical Course User Index [HN] Audley Hose, MD [SG] Jeanell Sparrow, DO    Dispo: Admitted to hospitalist pending CT PE ------------------------------- Cindee Lame, MD Emergency Medicine  This note was created using dictation software, which may contain spelling or grammatical errors.   Audley Hose, MD 05/17/22 [id number]

## 2022-05-06 NOTE — H&P (Signed)
History and Physical    Lee Nelson:[id number] DOB: 06-09-1940 DOA: 05/06/2022  PCP: Prince Solian, MD  Patient coming from: Home  Chief Complaint: Generalized weakness  HPI: Lee Nelson is a 82 y.o. male with medical history significant of dementia, anxiety, arthritis, osteoporosis, BPH, nephrolithiasis, history of skin squamous cell carcinoma status post excision, PSVT, GERD, chronic thrombocytopenia presented to the ED for evaluation of generalized weakness, sore throat, and fever in the setting of testing positive for COVID yesterday.  He slipped out of his bed this morning.  Patient has been nonambulatory lately and having difficulty completing ADLs.  PCP sent him to the ED to be evaluated. In the ED, patient is febrile with temperature 102.6 F and tachycardic.  Oxygen saturation dropped to the mid 80s on room air and placed on 3 L [place].  Labs showing no leukocytosis, platelet count 103k (chronically low), BNP normal, SARS-CoV-2 PCR positive.  CT head showing findings of normal pressure hydrocephalus.  CT of cervical, thoracic, and lumbar spine negative for acute finding.  CT angiogram chest showing: "IMPRESSION: 1. No CT evidence of pulmonary artery embolus. 2. Right lower lobe aspiration or pneumonia. 3. Small right pleural effusion. 4. Infiltration of the fat surrounding the esophagus. Correlation with clinical exam recommended to exclude esophagitis. 5.  Aortic Atherosclerosis (ICD10-I70.0)."  Patient was given 500 cc normal saline bolus.  ED physician discussed CT head findings with neurology (Dr. Rory Percy) who felt that the scan is unchanged from prior and recommended outpatient neurosurgery follow-up.  [facility] called to admit.  Patient is reporting 2-day history of fevers, chills, cough, and generalized weakness.  Denies shortness of breath or chest pain.  He reports rolling out of bed and falling a few days ago.  He thinks he might of hit his head but denies loss of  consciousness.  Not reporting pain anywhere.  No other complaints.  Denies nausea, vomiting, abdominal pain, or diarrhea.  Review of Systems:  Review of Systems  All other systems reviewed and are negative.   Past Medical History:  Diagnosis Date   Anxiety    Arthritis    Bladder calculus    BPH (benign prostatic hyperplasia)    Chronic constipation    Complication of anesthesia    " I had some coughing afterwards for a couple of days"--  per pt "perfers spinal anesthesia since general anesthesia congnitive issues when older"   Diverticulosis of colon    Dry eye syndrome of both eyes    Environmental allergies    GERD (gastroesophageal reflux disease)    occasional   History of adenomatous polyp of colon    08/ 2004   History of kidney stones    History of squamous cell carcinoma in situ (SCCIS) of skin    s/p  excision 2013 facial areas and 06/ 2016 nose   Migraine    eye migraine occasional   Seasonal and perennial allergic rhinitis    Thrombocytopenia (HCC)    Tingling    hands and feet bilat , intermittantly-- per pt has lumbar bulging disk   Urinary frequency    Vocal fold atrophy    dysphonia-- per pt has to drink large amount of water to take even on pill   Wears glasses     Past Surgical History:  Procedure Laterality Date   APPENDECTOMY  child   CARDIOVASCULAR STRESS TEST  05-17-2015  dr hilty   Low risk nuclear study w/ no ischemia/  normal LV function and  wall motion , stress ef 54% (lvef 45-54%)   CHOLECYSTECTOMY N/A 09/29/2014   Procedure: LAPAROSCOPIC CHOLECYSTECTOMY WITH INTRAOPERATIVE CHOLANGIOGRAM;  Surgeon: Ovidio Kinavid Newman, MD;  Location: [facility];  Service: General;  Laterality: N/A;   COLONOSCOPY  last one 09-06-2010   ESOPHAGOGASTRODUODENOSCOPY N/A 12/09/2013   Procedure: ESOPHAGOGASTRODUODENOSCOPY (EGD);  Surgeon: Willis ModenaWilliam Outlaw, MD;  [facility];  Service: Endoscopy;  Laterality: N/A;   EXTRACORPOREAL SHOCK WAVE LITHOTRIPSY  yrs ago    INGUINAL HERNIA REPAIR Left child   inguinal hernia repair  09/2017   NISSEN FUNDOPLICATION  1980's   open   STONE EXTRACTION WITH BASKET N/A 08/18/2016   Procedure: STONE EXTRACTION WITH BASKET;  Surgeon: Jethro Bolusannenbaum, Sigmund, MD;  [facility] [place];  Service: Urology;  Laterality: N/A;   THULIUM LASER TURP (TRANSURETHRAL RESECTION OF PROSTATE) N/A 08/18/2016   Procedure: THULIUM LASER BLADDER NECK INCISION AND BLADDER STONE REMOVAL;  Surgeon: Jethro Bolusannenbaum, Sigmund, MD;  [facility] [place];  Service: Urology;  Laterality: N/A;   TONSILLECTOMY  child   TRANSTHORACIC ECHOCARDIOGRAM  11/18/2010   grade 1 diastolic dysfunction, ef 55-60%/  trivial MR and TR/ mild dilated RA     reports that he has never smoked. He has never used smokeless tobacco. He reports current alcohol use. He reports that he does not use drugs.  Allergies  Allergen Reactions   Ciprofloxacin     JOINT PAIN   Flagyl [Metronidazole] Other (See Comments)    REACTION: no appetite, diarrhea after meal, decrease in weight   Metoclopramide Hcl Other (See Comments)    REACTION: "involuntary movements"   Other Other (See Comments)    Antibiotics have unknown reaction propophol causes memory problems   Propofol Other (See Comments)   Risperidone Other (See Comments)    "do not want"   Septra [Sulfamethoxazole-Trimethoprim] Other (See Comments)    REACTION: "involuntary movements" tripac antibiotic- heart rythm   Silodosin     ? Possibly allergy, could not breathe well out of nose   Soy Allergy Other (See Comments)    Stomach upset, "gas"    Family History  Problem Relation Age of Onset   Parkinsonism Brother    COPD Mother    Allergies Mother    Heart failure Mother    COPD Father    Stroke Father    Other Sister    Suicidality Maternal Aunt    Cancer Maternal Grandfather     Prior to Admission medications   Medication Sig Start Date End Date Taking? Authorizing Provider   nirmatrelvir & ritonavir (PAXLOVID, 300/100,) 20 x 150 MG & 10 x 100MG  TBPK Take 3 tablets by mouth 2 (two) times daily for 5 days. 05/06/22 05/11/22 Yes Sloan LeiterGray, Samuel A, DO  acetaminophen-codeine (TYLENOL #3) 300-30 MG tablet 1 po q 8 prn pain 08/03/20   Cammy Copaean, Gregory Scott, MD  Acetylcysteine (NAC) 500 MG CAPS Take by mouth.    [provider]  B Complex Vitamins (VITAMIN B COMPLEX) TABS 1 tab    [provider]  Cholecalciferol (VITAMIN D-3) 1000 units CAPS Take 2,000 Units by mouth daily.     [provider]  Cranberry 400 MG CAPS Take 400-1,200 mg by mouth every morning.     [provider]  docusate sodium (COLACE) 50 MG capsule Take 1 capsule (50 mg total) by mouth at bedtime. 01/05/20   Lanae BoastKc, Ramesh, MD  MAGNESIUM CITRATE PO Take 75 mg by mouth daily.    [provider]  Menaquinone-7 (  VITAMIN K2 PO) Take 120-240 mg by mouth daily. Combo w/ Vit. D    [provider]  NON FORMULARY Take 1 capsule by mouth daily. Acetyl L carnitine    [provider]  OVER THE COUNTER MEDICATION Take 3 capsules by mouth 2 (two) times daily. Lion's mane 0.5 gram/capsule    [provider]  OVER THE COUNTER MEDICATION Take 2 capsules by mouth every morning. Reparagen supplement    [provider]  OVER THE COUNTER MEDICATION Melatonin patch    [provider]  Phosphatidylserine 100 MG CAPS Take 100 mg by mouth every morning.     [provider]  Probiotic Product (PROBIOTIC DAILY PO) Take 1 capsule by mouth daily.     [provider]  Propylene Glycol (SYSTANE BALANCE) 0.6 % SOLN Apply 1 drop to eye daily as needed (dry eyes).     [provider]  sodium chloride (MURO 128) 5 % ophthalmic ointment Place 1 application into both eyes at bedtime.     [provider]  sodium chloride (MURO 128) 5 % ophthalmic solution See admin instructions. 09/01/20   [provider]  tadalafil (CIALIS) 5  MG tablet Take by mouth.    [provider]  THEANINE PO Take 2 tablets by mouth 2 (two) times daily.     [provider]  THIAMINE HCL PO Take by mouth.    [provider]  UNABLE TO FIND Med Name: cocovia memory    [provider]  vitamin C (ASCORBIC ACID) 250 MG tablet Take 250 mg by mouth daily.    [provider]    Physical Exam: Vitals:   05/06/22 1815 05/06/22 1830 05/06/22 1845 05/06/22 1930  BP: (!) 148/71 (!) 125/103 (!) 141/60 (!) 158/51  Pulse: 100 92 88 96  Resp: (!) 23 (!) 21    Temp:      TempSrc:      SpO2: 92% 93% 94% 94%  Weight:      Height:        Physical Exam Vitals reviewed.  Constitutional:      General: He is not in acute distress.    Comments: Appears very lethargic  HENT:     Head: Normocephalic and atraumatic.  Eyes:     Extraocular Movements: Extraocular movements intact.  Cardiovascular:     Rate and Rhythm: Normal rate and regular rhythm.     Pulses: Normal pulses.  Pulmonary:     Effort: No respiratory distress.     Breath sounds: No wheezing or rales.     Comments: Slightly tachypneic with respiratory rate in the mid 20s Abdominal:     General: Bowel sounds are normal. There is no distension.     Palpations: Abdomen is soft.     Tenderness: There is no abdominal tenderness.  Musculoskeletal:     Cervical back: Normal range of motion.     Right lower leg: No edema.     Left lower leg: No edema.  Skin:    General: Skin is warm and dry.  Neurological:     General: No focal deficit present.     Mental Status: He is alert and oriented to person, place, and time.     Labs on Admission: I have personally reviewed following labs and imaging studies  CBC: Recent Labs  Lab 05/06/22 0955  WBC 8.8  NEUTROABS 7.4  HGB 13.8  HCT 40.9  MCV 94.9  PLT 103*   Basic  Metabolic Panel: Recent Labs  Lab 05/06/22 0955  NA 135  K 3.5  CL 101  CO2 23  GLUCOSE 104*  BUN 23  CREATININE 0.74   CALCIUM 8.5*   GFR: Estimated Creatinine Clearance: 56.3 mL/min (by C-G formula based on SCr of 0.74 mg/dL). Liver Function Tests: No results for input(s): "AST", "ALT", "ALKPHOS", "BILITOT", "PROT", "ALBUMIN" in the last 168 hours. No results for input(s): "LIPASE", "AMYLASE" in the last 168 hours. No results for input(s): "AMMONIA" in the last 168 hours. Coagulation Profile: No results for input(s): "INR", "PROTIME" in the last 168 hours. Cardiac Enzymes: No results for input(s): "CKTOTAL", "CKMB", "CKMBINDEX", "TROPONINI" in the last 168 hours. BNP (last 3 results) No results for input(s): "PROBNP" in the last 8760 hours. HbA1C: No results for input(s): "HGBA1C" in the last 72 hours. CBG: Recent Labs  Lab 05/06/22 0854  GLUCAP 116*   Lipid Profile: No results for input(s): "CHOL", "HDL", "LDLCALC", "TRIG", "CHOLHDL", "LDLDIRECT" in the last 72 hours. Thyroid Function Tests: No results for input(s): "TSH", "T4TOTAL", "FREET4", "T3FREE", "THYROIDAB" in the last 72 hours. Anemia Panel: No results for input(s): "VITAMINB12", "FOLATE", "FERRITIN", "TIBC", "IRON", "RETICCTPCT" in the last 72 hours. Urine analysis:    Component Value Date/Time   COLORURINE YELLOW 12/31/2019 1149   APPEARANCEUR CLEAR 12/31/2019 1149   LABSPEC 1.025 12/31/2019 1149   PHURINE 5.0 12/31/2019 1149   GLUCOSEU NEGATIVE 12/31/2019 1149   HGBUR NEGATIVE 12/31/2019 1149   BILIRUBINUR NEGATIVE 12/31/2019 1149   KETONESUR 20 (A) 12/31/2019 1149   PROTEINUR 30 (A) 12/31/2019 1149   UROBILINOGEN 0.2 11/20/2014 2159   NITRITE NEGATIVE 12/31/2019 1149   LEUKOCYTESUR NEGATIVE 12/31/2019 1149    Radiological Exams on Admission: CT Angio Chest PE W and/or Wo Contrast  Result Date: 05/06/2022 CLINICAL DATA:  Concern for pulmonary embolism. EXAM: CT ANGIOGRAPHY CHEST WITH CONTRAST TECHNIQUE: Multidetector CT imaging of the chest was performed using the standard protocol during bolus administration of  intravenous contrast. Multiplanar CT image reconstructions and MIPs were obtained to evaluate the vascular anatomy. RADIATION DOSE REDUCTION: This exam was performed according to the departmental dose-optimization program which includes automated exposure control, adjustment of the mA and/or kV according to patient size and/or use of iterative reconstruction technique. CONTRAST:  48mL OMNIPAQUE IOHEXOL 350 MG/ML SOLN COMPARISON:  Chest radiograph dated 05/06/2022. FINDINGS: Evaluation of this exam is limited due to respiratory motion artifact. Evaluation is also limited due to streak artifact caused by patient's arms and overlying support wires. Cardiovascular: There is no cardiomegaly or pericardial effusion. Mild atherosclerotic calcification of the thoracic aorta. No aneurysmal dilatation or dissection. The origins of the great vessels of the aortic arch appear patent. Evaluation of the pulmonary arteries is limited due to respiratory motion. No pulmonary artery embolus identified. Mediastinum/Nodes: No hilar or mediastinal adenopathy. There is infiltration of the fat surrounding the esophagus. Correlation with clinical exam recommended to exclude esophagitis. No mediastinal fluid collection. Lungs/Pleura: Mild bronchiectasis. Clusters of ground-glass nodular densities predominantly involving the right lower lobe as well as patchy area of airspace density at the right lung base most consistent with aspiration or pneumonia. Patchy area of interstitial coarsening involving the right upper lobe, likely chronic. There are biapical subpleural scarring. There is a small right pleural effusion. No pneumothorax. Mucus debris or aspirated content noted in the right mainstem bronchus extending into the bronchus intermedius and right lower lobe bronchi. There is impaction of a right lower lobe bronchus. Upper Abdomen: A 5 cm cyst in the  left lobe of the liver. Musculoskeletal: No acute osseous pathology. Review of the MIP  images confirms the above findings. IMPRESSION: 1. No CT evidence of pulmonary artery embolus. 2. Right lower lobe aspiration or pneumonia. 3. Small right pleural effusion. 4. Infiltration of the fat surrounding the esophagus. Correlation with clinical exam recommended to exclude esophagitis. 5.  Aortic Atherosclerosis (ICD10-I70.0). Electronically Signed   By: Elgie Collard M.D.   On: 05/06/2022 20:26   CT Cervical Spine Wo Contrast  Result Date: 05/06/2022 CLINICAL DATA:  Neck trauma. EXAM: CT CERVICAL SPINE WITHOUT CONTRAST TECHNIQUE: Multidetector CT imaging of the cervical spine was performed without intravenous contrast. Multiplanar CT image reconstructions were also generated. RADIATION DOSE REDUCTION: This exam was performed according to the departmental dose-optimization program which includes automated exposure control, adjustment of the mA and/or kV according to patient size and/or use of iterative reconstruction technique. COMPARISON:  None Available. FINDINGS: Alignment: Normal. Skull base and vertebrae: No acute fracture. No primary bone lesion or focal pathologic process. Soft tissues and spinal canal: No prevertebral fluid or swelling. No visible canal hematoma. Disc levels:  Maintained Upper chest: Biapical pleural/parenchymal changes most likely scarring. IMPRESSION: No acute traumatic abnormalities. Electronically Signed   By: Layla Maw M.D.   On: 05/06/2022 12:46   DG Chest Portable 1 View  Result Date: 05/06/2022 CLINICAL DATA:  Cough, weakness, and fever.  COVID positive. EXAM: PORTABLE CHEST 1 VIEW COMPARISON:  Chest x-ray dated April 19, 2017. FINDINGS: The heart size and mediastinal contours are within normal limits. Normal pulmonary vascularity. Chronic biapical pleural-parenchymal scarring again noted. No focal consolidation, pleural effusion, or pneumothorax. No acute osseous abnormality. IMPRESSION: No active disease. Electronically Signed   By: Obie Dredge M.D.    On: 05/06/2022 10:13   CT Thoracic Spine Wo Contrast  Result Date: 05/06/2022 CLINICAL DATA:  Larey Seat out of bed.  COVID positive. EXAM: CT THORACIC AND LUMBAR SPINE WITHOUT CONTRAST TECHNIQUE: Multidetector CT imaging of the thoracic and lumbar spine was performed without contrast. Multiplanar CT image reconstructions were also generated. RADIATION DOSE REDUCTION: This exam was performed according to the departmental dose-optimization program which includes automated exposure control, adjustment of the mA and/or kV according to patient size and/or use of iterative reconstruction technique. COMPARISON:  Lumbar spine x-rays dated April 14, 2022. MRI lumbar spine dated July 15, 2020. Chest x-ray dated April 19, 2017. FINDINGS: CT THORACIC SPINE FINDINGS Alignment: Normal. Vertebrae: No acute fracture or focal pathologic process. Osteopenia. Paraspinal and other soft tissues: Biapical pleuroparenchymal scarring. Right upper lobe bronchiectasis. Disc levels: Disc heights are relatively preserved. Minimal degenerative disc disease in the midthoracic spine. CT LUMBAR SPINE FINDINGS Segmentation: 5 lumbar type vertebrae. Alignment: Mild dextroscoliosis.  No significant listhesis. Vertebrae: No acute fracture or focal pathologic process. Old healed fracture of the right L2 transverse process. Osteopenia. Paraspinal and other soft tissues: Aortoiliac atherosclerotic vascular disease. 5.1 cm simple cyst in the left liver. Few punctate calculi in the lower pole of the left kidney. Disc levels: Mild disc bulging at L4-L5 and L5-S1. Mild bilateral facet arthropathy from L3-L4 through L5-S1. IMPRESSION: 1. No acute osseous abnormality of the thoracic or lumbar spine. 2. Nonobstructive left nephrolithiasis. 3.  Aortic Atherosclerosis (ICD10-I70.0). Electronically Signed   By: Obie Dredge M.D.   On: 05/06/2022 10:08   CT Lumbar Spine Wo Contrast  Result Date: 05/06/2022 CLINICAL DATA:  Larey Seat out of bed.  COVID positive.  EXAM: CT THORACIC AND LUMBAR SPINE WITHOUT CONTRAST TECHNIQUE: Multidetector CT imaging  of the thoracic and lumbar spine was performed without contrast. Multiplanar CT image reconstructions were also generated. RADIATION DOSE REDUCTION: This exam was performed according to the departmental dose-optimization program which includes automated exposure control, adjustment of the mA and/or kV according to patient size and/or use of iterative reconstruction technique. COMPARISON:  Lumbar spine x-rays dated April 14, 2022. MRI lumbar spine dated July 15, 2020. Chest x-ray dated April 19, 2017. FINDINGS: CT THORACIC SPINE FINDINGS Alignment: Normal. Vertebrae: No acute fracture or focal pathologic process. Osteopenia. Paraspinal and other soft tissues: Biapical pleuroparenchymal scarring. Right upper lobe bronchiectasis. Disc levels: Disc heights are relatively preserved. Minimal degenerative disc disease in the midthoracic spine. CT LUMBAR SPINE FINDINGS Segmentation: 5 lumbar type vertebrae. Alignment: Mild dextroscoliosis.  No significant listhesis. Vertebrae: No acute fracture or focal pathologic process. Old healed fracture of the right L2 transverse process. Osteopenia. Paraspinal and other soft tissues: Aortoiliac atherosclerotic vascular disease. 5.1 cm simple cyst in the left liver. Few punctate calculi in the lower pole of the left kidney. Disc levels: Mild disc bulging at L4-L5 and L5-S1. Mild bilateral facet arthropathy from L3-L4 through L5-S1. IMPRESSION: 1. No acute osseous abnormality of the thoracic or lumbar spine. 2. Nonobstructive left nephrolithiasis. 3.  Aortic Atherosclerosis (ICD10-I70.0). Electronically Signed   By: Titus Dubin M.D.   On: 05/06/2022 10:08   CT Head Wo Contrast  Result Date: 05/06/2022 CLINICAL DATA:  Head trauma. EXAM: CT HEAD WITHOUT CONTRAST TECHNIQUE: Contiguous axial images were obtained from the base of the skull through the vertex without intravenous contrast.  RADIATION DOSE REDUCTION: This exam was performed according to the departmental dose-optimization program which includes automated exposure control, adjustment of the mA and/or kV according to patient size and/or use of iterative reconstruction technique. COMPARISON:  None Available. FINDINGS: Brain: There is periventricular white matter decreased attenuation consistent with small vessel ischemic changes. Ventricles, sulci and cisterns are prominent consistent with age related involutional changes. No acute intracranial hemorrhage, mass effect or shift. There is ventricular prominence is out of proportion to the extent of involutional changes suggesting normal pressure hydrocephalus. Vascular: No hyperdense vessel or unexpected calcification. Skull: Normal. Negative for fracture or focal lesion. Sinuses/Orbits: There is a splenoid cyst or polyp measuring 1.1 cm. Mucoperiosteal thickening consistent with chronic maxillary and ethmoid sinusitis. IMPRESSION: 1. Atrophy and chronic small vessel ischemic changes. 2. Findings suggest normal pressure hydrocephalus. 3. Sequelae of chronic sinusitis. Electronically Signed   By: Sammie Bench M.D.   On: 05/06/2022 09:56    EKG: Independently reviewed. Sinus rhythm, no significant change since prior tracing.   Assessment and Plan  Severe sepsis secondary to COVID-19 pneumonia versus possible aspiration pneumonia Acute hypoxemic respiratory failure Meets criteria for severe sepsis with fever, tachycardia, tachypnea, and acute hypoxemia.  Oxygen saturation in the mid 80s on room air, currently requiring 3 L.  SARS-CoV-2 PCR positive.  CT angiogram chest negative for PE.  Showing findings of right lower lobe aspiration or pneumonia and small right pleural effusion.  BNP is normal. ?Bacterial infection given no leukocytosis. -Start remdesivir and Decadron -Unasyn for possible aspiration pneumonia.  Check procalcitonin level. -Patient received 500 cc IV fluids in the  ED. Tachycardia improved and not hypotensive.  Continue gentle IV fluid hydration. -Keep n.p.o., SLP eval, aspiration precautions. -Acetaminophen suppository as needed for fevers -Airborne and contact precautions -Incentive spirometry, flutter valve -Encourage prone positioning -Continue supplemental oxygen -Blood culture x2 -Check lactic acid level  Normal pressure hydrocephalus -ED physician discussed CT head  findings with neurology (Dr. Wilford Corner) who felt that the scan is unchanged from prior and recommended outpatient neurosurgery follow-up.  Generalized weakness/physical deconditioning/fall risk -PT/OT eval, fall precautions -TOC consulted for placement  Chronic thrombocytopenia Platelet count 103k, no signs of bleeding.   -Monitor CBC  Dementia -Delirium precautions  Anxiety BPH History of PSVT: Cardiac monitoring GERD -Pharmacy med rec pending  DVT prophylaxis: Lovenox Code Status: Full Code (discussed with the patient) Family Communication: No family available at this time. Level of care: Progressive Care Unit Admission status: It is my clinical opinion that admission to INPATIENT is reasonable and necessary because of the expectation that this patient will require hospital care that crosses at least 2 midnights to treat this condition based on the medical complexity of the problems presented.  Given the aforementioned information, the predictability of an adverse outcome is felt to be significant.   John Giovanni MD [facility]  If 7PM-7AM, please contact night-coverage [email or web address]  05/06/2022, 8:30 PM

## 2022-05-06 NOTE — ED Notes (Signed)
ED TO INPATIENT HANDOFF REPORT  Name/Age/Gender Lee Nelson 82 y.o. male  Code Status Code Status History     Date Active Date Inactive Code Status Order ID Comments User Context   12/31/2019 1735 01/05/2020 1526 Full Code [id number]  Mercy Riding, MD ED      Advance Directive Documentation    Flowsheet Row Most Recent Value  Type of Advance Directive Healthcare Power of Attorney  Pre-existing out of facility DNR order (yellow form or pink MOST form) --  "MOST" Form in Place? --       Home/SNF/Other Home  Chief Complaint Pneumonia due to COVID-19 virus [U07.1, J12.82]  Level of Care/Admitting Diagnosis ED Disposition     ED Disposition  Admit   Condition  --   [place]: [place] [[id number]]  Level of Care: Progressive [102]  Admit to Progressive based on following criteria: RESPIRATORY PROBLEMS hypoxemic/hypercapnic respiratory failure that is responsive to NIPPV (BiPAP) or High Flow Nasal Cannula (6-80 lpm). Frequent assessment/intervention, no > Q2 hrs < Q4 hrs, to maintain oxygenation and pulmonary hygiene.  May [facility] patient to [facility] or [facility] if equivalent level of care is available:: Yes  Covid Evaluation: Asymptomatic - no recent exposure (last 10 days) testing not required  Diagnosis: Pneumonia due to COVID-19 virus [[id number]]  Admitting Physician: Shela Leff [[id number]]  Attending Physician: Shela Leff [[id number]]  Certification:: I certify this patient will need inpatient services for at least 2 midnights  Estimated Length of Stay: 2          Medical History Past Medical History:  Diagnosis Date   Anxiety    Arthritis    Bladder calculus    BPH (benign prostatic hyperplasia)    Chronic constipation    Complication of anesthesia    " I had some coughing afterwards for a couple of days"--  per pt "perfers spinal anesthesia since general anesthesia congnitive issues when older"    Diverticulosis of colon    Dry eye syndrome of both eyes    Environmental allergies    GERD (gastroesophageal reflux disease)    occasional   History of adenomatous polyp of colon    08/ 2004   History of kidney stones    History of squamous cell carcinoma in situ (SCCIS) of skin    s/p  excision 2013 facial areas and 06/ 2016 nose   Migraine    eye migraine occasional   Seasonal and perennial allergic rhinitis    Thrombocytopenia (HCC)    Tingling    hands and feet bilat , intermittantly-- per pt has lumbar bulging disk   Urinary frequency    Vocal fold atrophy    dysphonia-- per pt has to drink large amount of water to take even on pill   Wears glasses     Allergies Allergies  Allergen Reactions   Ciprofloxacin     JOINT PAIN   Flagyl [Metronidazole] Other (See Comments)    REACTION: no appetite, diarrhea after meal, decrease in weight   Metoclopramide Hcl Other (See Comments)    REACTION: "involuntary movements"   Other Other (See Comments)    Antibiotics have unknown reaction propophol causes memory problems   Propofol Other (See Comments)   Risperidone Other (See Comments)    "do not want"   Septra [Sulfamethoxazole-Trimethoprim] Other (See Comments)    REACTION: "involuntary movements" tripac antibiotic- heart rythm   Silodosin     ? Possibly allergy, could not breathe  well out of nose   Soy Allergy Other (See Comments)    Stomach upset, "gas"    IV Location/Drains/Wounds Patient Lines/Drains/Airways Status     Active Line/Drains/Airways     Name Placement date Placement time Site Days   Peripheral IV 05/06/22 20 G Anterior;Right Forearm 05/06/22  0958  Forearm  less than 1   Incision (Closed) 08/18/16 Penis Other (Comment) 08/18/16  0904  -- 2087   Incision - 4 Ports Abdomen 1: Right;Lower;Lateral 2: Right;Upper 3: Umbilicus 4: Mid;Upper 09/29/14  1000  -- 2776            Labs/Imaging Results for orders placed or performed during the hospital  encounter of 05/06/22 (from the past 48 hour(s))  CBG monitoring, ED     Status: Abnormal   Collection Time: 05/06/22  8:54 AM  Result Value Ref Range   Glucose-Capillary 116 (H) 70 - 99 mg/dL    Comment: Glucose reference range applies only to samples taken after fasting for at least 8 hours.  Brain natriuretic peptide     Status: None   Collection Time: 05/06/22  9:08 AM  Result Value Ref Range   B Natriuretic Peptide 68.4 0.0 - 100.0 pg/mL    Comment: Performed at [facility], [address] [place], [place] [address]  Basic metabolic panel     Status: Abnormal   Collection Time: 05/06/22  9:55 AM  Result Value Ref Range   Sodium 135 135 - 145 mmol/L   Potassium 3.5 3.5 - 5.1 mmol/L   Chloride 101 98 - 111 mmol/L   CO2 23 22 - 32 mmol/L   Glucose, Bld 104 (H) 70 - 99 mg/dL    Comment: Glucose reference range applies only to samples taken after fasting for at least 8 hours.   BUN 23 8 - 23 mg/dL   Creatinine, Ser 0.76 0.61 - 1.24 mg/dL   Calcium 8.5 (L) 8.9 - 10.3 mg/dL   GFR, Estimated >80 >88 mL/min    Comment: (NOTE) Calculated using the CKD-EPI Creatinine Equation (2021)    Anion gap 11 5 - 15    Comment: Performed at [facility], [address], [place], [place] [address]  CBC with Differential     Status: Abnormal   Collection Time: 05/06/22  9:55 AM  Result Value Ref Range   WBC 8.8 4.0 - 10.5 K/uL   RBC 4.31 4.22 - 5.81 MIL/uL   Hemoglobin 13.8 13.0 - 17.0 g/dL   HCT 59.4 58.5 - 92.9 %   MCV 94.9 80.0 - 100.0 fL   MCH 32.0 26.0 - 34.0 pg   MCHC 33.7 30.0 - 36.0 g/dL   RDW 24.4 62.8 - 63.8 %   Platelets 103 (L) 150 - 400 K/uL   nRBC 0.0 0.0 - 0.2 %   Neutrophils Relative % 84 %   Neutro Abs 7.4 1.7 - 7.7 K/uL   Lymphocytes Relative 5 %   Lymphs Abs 0.4 (L) 0.7 - 4.0 K/uL   Monocytes Relative 11 %   Monocytes Absolute 1.0 0.1 - 1.0 K/uL   Eosinophils Relative 0 %   Eosinophils Absolute 0.0 0.0 - 0.5 K/uL   Basophils  Relative 0 %   Basophils Absolute 0.0 0.0 - 0.1 K/uL   Immature Granulocytes 0 %   Abs Immature Granulocytes 0.01 0.00 - 0.07 K/uL    Comment: Performed at [facility], [address] [place], [place] [address]  Resp panel by  RT-PCR (RSV, Flu A&B, Covid) Anterior Nasal Swab     Status: Abnormal   Collection Time: 05/06/22  9:55 AM   Specimen: Anterior Nasal Swab  Result Value Ref Range   SARS Coronavirus 2 by RT PCR POSITIVE (A) NEGATIVE    Comment: (NOTE) SARS-CoV-2 target nucleic acids are DETECTED.  The SARS-CoV-2 RNA is generally detectable in upper respiratory specimens during the acute phase of infection. Positive results are indicative of the presence of the identified virus, but do not rule out bacterial infection or co-infection with other pathogens not detected by the test. Clinical correlation with patient history and other diagnostic information is necessary to determine patient infection status. The expected result is Negative.  Fact Sheet for Patients: BloggerCourse.com  Fact Sheet for Healthcare Providers: SeriousBroker.it  This test is not yet approved or cleared by the [place] FDA and  has been authorized for detection and/or diagnosis of SARS-CoV-2 by FDA under an Emergency Use Authorization (EUA).  This EUA will remain in effect (meaning this test can be used) for the duration of  the COVID-19 declaration under Section 564(b)(1) of the A ct, 21 U.S.C. section 360bbb-3(b)(1), unless the authorization is terminated or revoked sooner.     Influenza A by PCR NEGATIVE NEGATIVE   Influenza B by PCR NEGATIVE NEGATIVE    Comment: (NOTE) The Xpert Xpress SARS-CoV-2/FLU/RSV plus assay is intended as an aid in the diagnosis of influenza from Nasopharyngeal swab specimens and should not be used as a sole basis for treatment. Nasal washings and aspirates are unacceptable for Xpert Xpress  SARS-CoV-2/FLU/RSV testing.  Fact Sheet for Patients: BloggerCourse.com  Fact Sheet for Healthcare Providers: SeriousBroker.it  This test is not yet approved or cleared by the [place] FDA and has been authorized for detection and/or diagnosis of SARS-CoV-2 by FDA under an Emergency Use Authorization (EUA). This EUA will remain in effect (meaning this test can be used) for the duration of the COVID-19 declaration under Section 564(b)(1) of the Act, 21 U.S.C. section 360bbb-3(b)(1), unless the authorization is terminated or revoked.     Resp Syncytial Virus by PCR NEGATIVE NEGATIVE    Comment: (NOTE) Fact Sheet for Patients: BloggerCourse.com  Fact Sheet for Healthcare Providers: SeriousBroker.it  This test is not yet approved or cleared by the [place] FDA and has been authorized for detection and/or diagnosis of SARS-CoV-2 by FDA under an Emergency Use Authorization (EUA). This EUA will remain in effect (meaning this test can be used) for the duration of the COVID-19 declaration under Section 564(b)(1) of the Act, 21 U.S.C. section 360bbb-3(b)(1), unless the authorization is terminated or revoked.  Performed at [facility], [address] [place], [place] [address]    CT Angio Chest PE W and/or Wo Contrast  Result Date: 05/06/2022 CLINICAL DATA:  Concern for pulmonary embolism. EXAM: CT ANGIOGRAPHY CHEST WITH CONTRAST TECHNIQUE: Multidetector CT imaging of the chest was performed using the standard protocol during bolus administration of intravenous contrast. Multiplanar CT image reconstructions and MIPs were obtained to evaluate the vascular anatomy. RADIATION DOSE REDUCTION: This exam was performed according to the departmental dose-optimization program which includes automated exposure control, adjustment of the mA and/or kV according to patient  size and/or use of iterative reconstruction technique. CONTRAST:  60mL OMNIPAQUE IOHEXOL 350 MG/ML SOLN COMPARISON:  Chest radiograph dated 05/06/2022. FINDINGS: Evaluation of this exam is limited due to respiratory motion artifact. Evaluation is also limited due to streak artifact caused by patient's arms and overlying support  wires. Cardiovascular: There is no cardiomegaly or pericardial effusion. Mild atherosclerotic calcification of the thoracic aorta. No aneurysmal dilatation or dissection. The origins of the great vessels of the aortic arch appear patent. Evaluation of the pulmonary arteries is limited due to respiratory motion. No pulmonary artery embolus identified. Mediastinum/Nodes: No hilar or mediastinal adenopathy. There is infiltration of the fat surrounding the esophagus. Correlation with clinical exam recommended to exclude esophagitis. No mediastinal fluid collection. Lungs/Pleura: Mild bronchiectasis. Clusters of ground-glass nodular densities predominantly involving the right lower lobe as well as patchy area of airspace density at the right lung base most consistent with aspiration or pneumonia. Patchy area of interstitial coarsening involving the right upper lobe, likely chronic. There are biapical subpleural scarring. There is a small right pleural effusion. No pneumothorax. Mucus debris or aspirated content noted in the right mainstem bronchus extending into the bronchus intermedius and right lower lobe bronchi. There is impaction of a right lower lobe bronchus. Upper Abdomen: A 5 cm cyst in the left lobe of the liver. Musculoskeletal: No acute osseous pathology. Review of the MIP images confirms the above findings. IMPRESSION: 1. No CT evidence of pulmonary artery embolus. 2. Right lower lobe aspiration or pneumonia. 3. Small right pleural effusion. 4. Infiltration of the fat surrounding the esophagus. Correlation with clinical exam recommended to exclude esophagitis. 5.  Aortic  Atherosclerosis (ICD10-I70.0). Electronically Signed   By: Elgie Collard M.D.   On: 05/06/2022 20:26   CT Cervical Spine Wo Contrast  Result Date: 05/06/2022 CLINICAL DATA:  Neck trauma. EXAM: CT CERVICAL SPINE WITHOUT CONTRAST TECHNIQUE: Multidetector CT imaging of the cervical spine was performed without intravenous contrast. Multiplanar CT image reconstructions were also generated. RADIATION DOSE REDUCTION: This exam was performed according to the departmental dose-optimization program which includes automated exposure control, adjustment of the mA and/or kV according to patient size and/or use of iterative reconstruction technique. COMPARISON:  None Available. FINDINGS: Alignment: Normal. Skull base and vertebrae: No acute fracture. No primary bone lesion or focal pathologic process. Soft tissues and spinal canal: No prevertebral fluid or swelling. No visible canal hematoma. Disc levels:  Maintained Upper chest: Biapical pleural/parenchymal changes most likely scarring. IMPRESSION: No acute traumatic abnormalities. Electronically Signed   By: Layla Maw M.D.   On: 05/06/2022 12:46   DG Chest Portable 1 View  Result Date: 05/06/2022 CLINICAL DATA:  Cough, weakness, and fever.  COVID positive. EXAM: PORTABLE CHEST 1 VIEW COMPARISON:  Chest x-ray dated April 19, 2017. FINDINGS: The heart size and mediastinal contours are within normal limits. Normal pulmonary vascularity. Chronic biapical pleural-parenchymal scarring again noted. No focal consolidation, pleural effusion, or pneumothorax. No acute osseous abnormality. IMPRESSION: No active disease. Electronically Signed   By: Obie Dredge M.D.   On: 05/06/2022 10:13   CT Thoracic Spine Wo Contrast  Result Date: 05/06/2022 CLINICAL DATA:  Larey Seat out of bed.  COVID positive. EXAM: CT THORACIC AND LUMBAR SPINE WITHOUT CONTRAST TECHNIQUE: Multidetector CT imaging of the thoracic and lumbar spine was performed without contrast. Multiplanar CT image  reconstructions were also generated. RADIATION DOSE REDUCTION: This exam was performed according to the departmental dose-optimization program which includes automated exposure control, adjustment of the mA and/or kV according to patient size and/or use of iterative reconstruction technique. COMPARISON:  Lumbar spine x-rays dated April 14, 2022. MRI lumbar spine dated July 15, 2020. Chest x-ray dated April 19, 2017. FINDINGS: CT THORACIC SPINE FINDINGS Alignment: Normal. Vertebrae: No acute fracture or focal pathologic process. Osteopenia. Paraspinal and  other soft tissues: Biapical pleuroparenchymal scarring. Right upper lobe bronchiectasis. Disc levels: Disc heights are relatively preserved. Minimal degenerative disc disease in the midthoracic spine. CT LUMBAR SPINE FINDINGS Segmentation: 5 lumbar type vertebrae. Alignment: Mild dextroscoliosis.  No significant listhesis. Vertebrae: No acute fracture or focal pathologic process. Old healed fracture of the right L2 transverse process. Osteopenia. Paraspinal and other soft tissues: Aortoiliac atherosclerotic vascular disease. 5.1 cm simple cyst in the left liver. Few punctate calculi in the lower pole of the left kidney. Disc levels: Mild disc bulging at L4-L5 and L5-S1. Mild bilateral facet arthropathy from L3-L4 through L5-S1. IMPRESSION: 1. No acute osseous abnormality of the thoracic or lumbar spine. 2. Nonobstructive left nephrolithiasis. 3.  Aortic Atherosclerosis (ICD10-I70.0). Electronically Signed   By: Titus Dubin M.D.   On: 05/06/2022 10:08   CT Lumbar Spine Wo Contrast  Result Date: 05/06/2022 CLINICAL DATA:  [place] out of bed.  COVID positive. EXAM: CT THORACIC AND LUMBAR SPINE WITHOUT CONTRAST TECHNIQUE: Multidetector CT imaging of the thoracic and lumbar spine was performed without contrast. Multiplanar CT image reconstructions were also generated. RADIATION DOSE REDUCTION: This exam was performed according to the departmental  dose-optimization program which includes automated exposure control, adjustment of the mA and/or kV according to patient size and/or use of iterative reconstruction technique. COMPARISON:  Lumbar spine x-rays dated April 14, 2022. MRI lumbar spine dated July 15, 2020. Chest x-ray dated April 19, 2017. FINDINGS: CT THORACIC SPINE FINDINGS Alignment: Normal. Vertebrae: No acute fracture or focal pathologic process. Osteopenia. Paraspinal and other soft tissues: Biapical pleuroparenchymal scarring. Right upper lobe bronchiectasis. Disc levels: Disc heights are relatively preserved. Minimal degenerative disc disease in the midthoracic spine. CT LUMBAR SPINE FINDINGS Segmentation: 5 lumbar type vertebrae. Alignment: Mild dextroscoliosis.  No significant listhesis. Vertebrae: No acute fracture or focal pathologic process. Old healed fracture of the right L2 transverse process. Osteopenia. Paraspinal and other soft tissues: Aortoiliac atherosclerotic vascular disease. 5.1 cm simple cyst in the left liver. Few punctate calculi in the lower pole of the left kidney. Disc levels: Mild disc bulging at L4-L5 and L5-S1. Mild bilateral facet arthropathy from L3-L4 through L5-S1. IMPRESSION: 1. No acute osseous abnormality of the thoracic or lumbar spine. 2. Nonobstructive left nephrolithiasis. 3.  Aortic Atherosclerosis (ICD10-I70.0). Electronically Signed   By: Titus Dubin M.D.   On: 05/06/2022 10:08   CT Head Wo Contrast  Result Date: 05/06/2022 CLINICAL DATA:  Head trauma. EXAM: CT HEAD WITHOUT CONTRAST TECHNIQUE: Contiguous axial images were obtained from the base of the skull through the vertex without intravenous contrast. RADIATION DOSE REDUCTION: This exam was performed according to the departmental dose-optimization program which includes automated exposure control, adjustment of the mA and/or kV according to patient size and/or use of iterative reconstruction technique. COMPARISON:  None Available. FINDINGS:  Brain: There is periventricular white matter decreased attenuation consistent with small vessel ischemic changes. Ventricles, sulci and cisterns are prominent consistent with age related involutional changes. No acute intracranial hemorrhage, mass effect or shift. There is ventricular prominence is out of proportion to the extent of involutional changes suggesting normal pressure hydrocephalus. Vascular: No hyperdense vessel or unexpected calcification. Skull: Normal. Negative for fracture or focal lesion. Sinuses/Orbits: There is a splenoid cyst or polyp measuring 1.1 cm. Mucoperiosteal thickening consistent with chronic maxillary and ethmoid sinusitis. IMPRESSION: 1. Atrophy and chronic small vessel ischemic changes. 2. Findings suggest normal pressure hydrocephalus. 3. Sequelae of chronic sinusitis. Electronically Signed   By: Sammie Bench M.D.   On:  05/06/2022 09:56    Pending Labs Unresulted Labs (From admission, onward)    None       Vitals/Pain Today's Vitals   05/06/22 1830 05/06/22 1845 05/06/22 1930 05/06/22 2030  BP: (!) 125/103 (!) 141/60 (!) 158/51   Pulse: 92 88 96   Resp: (!) 21     Temp:    99.8 F (37.7 C)  TempSrc:    Oral  SpO2: 93% 94% 94%   Weight:      Height:      PainSc:        Isolation Precautions Airborne and Contact precautions  Medications Medications  sodium chloride 0.9 % bolus 500 mL (0 mLs Intravenous Stopped 05/06/22 1300)  iohexol (OMNIPAQUE) 350 MG/ML injection 80 mL (80 mLs Intravenous Contrast Given 05/06/22 2008)    Mobility non-ambulatorydermatitis

## 2022-05-07 ENCOUNTER — Encounter: Payer: Medicare Other | Admitting: Physical Therapy

## 2022-05-07 DIAGNOSIS — G912 (Idiopathic) normal pressure hydrocephalus: Secondary | ICD-10-CM | POA: Diagnosis not present

## 2022-05-07 DIAGNOSIS — J9601 Acute respiratory failure with hypoxia: Secondary | ICD-10-CM

## 2022-05-07 DIAGNOSIS — I471 Supraventricular tachycardia, unspecified: Secondary | ICD-10-CM

## 2022-05-07 DIAGNOSIS — U071 COVID-19: Secondary | ICD-10-CM | POA: Diagnosis not present

## 2022-05-07 DIAGNOSIS — D696 Thrombocytopenia, unspecified: Secondary | ICD-10-CM

## 2022-05-07 DIAGNOSIS — R5381 Other malaise: Secondary | ICD-10-CM

## 2022-05-07 DIAGNOSIS — R6520 Severe sepsis without septic shock: Secondary | ICD-10-CM

## 2022-05-07 DIAGNOSIS — A419 Sepsis, unspecified organism: Secondary | ICD-10-CM

## 2022-05-07 LAB — LACTIC ACID, PLASMA: Lactic Acid, Venous: 0.8 mmol/L (ref 0.5–1.9)

## 2022-05-07 LAB — BLOOD CULTURE ID PANEL (REFLEXED) - BCID2

## 2022-05-07 LAB — COMPREHENSIVE METABOLIC PANEL
ALT: 35 U/L (ref 0–44)
AST: 121 U/L — ABNORMAL HIGH (ref 15–41)
Albumin: 3.2 g/dL — ABNORMAL LOW (ref 3.5–5.0)
Alkaline Phosphatase: 44 U/L (ref 38–126)
Anion gap: 14 (ref 5–15)
BUN: 21 mg/dL (ref 8–23)
CO2: 22 mmol/L (ref 22–32)
Calcium: 8 mg/dL — ABNORMAL LOW (ref 8.9–10.3)
Chloride: 100 mmol/L (ref 98–111)
Creatinine, Ser: 0.77 mg/dL (ref 0.61–1.24)
GFR, Estimated: >60 mL/min (ref >60)
Glucose, Bld: 97 mg/dL (ref 70–99)
Potassium: 3.3 mmol/L — ABNORMAL LOW (ref 3.5–5.1)
Sodium: 136 mmol/L (ref 135–145)
Total Bilirubin: 1.1 mg/dL (ref 0.3–1.2)
Total Protein: 5.4 g/dL — ABNORMAL LOW (ref 6.5–8.1)

## 2022-05-07 LAB — CBC
HCT: 40.6 % (ref 39.0–52.0)
Hemoglobin: 13.9 g/dL (ref 13.0–17.0)
MCH: 32.7 pg (ref 26.0–34.0)
MCHC: 34.2 g/dL (ref 30.0–36.0)
MCV: 95.5 fL (ref 80.0–100.0)
Platelets: 99 10*3/uL — ABNORMAL LOW (ref 150–400)
RBC: 4.25 MIL/uL (ref 4.22–5.81)
RDW: 13.3 % (ref 11.5–15.5)
WBC: 9.1 10*3/uL (ref 4.0–10.5)
nRBC: 0 % (ref 0.0–0.2)

## 2022-05-07 LAB — PROCALCITONIN: Procalcitonin: 2.42 ng/mL

## 2022-05-07 MED ORDER — VITAMIN D3 25 MCG (1000 UNIT) PO TABS
2000.0000 [IU] | ORAL_TABLET | Freq: Every day | ORAL | Status: DC
  Administered 2022-05-07 – 2022-05-15 (×9): 2000 [IU] via ORAL
  Filled 2022-05-07 (×9): qty 2

## 2022-05-07 MED ORDER — DOCUSATE SODIUM 50 MG PO CAPS
50.0000 mg | ORAL_CAPSULE | Freq: Every day | ORAL | Status: DC
  Administered 2022-05-08 – 2022-05-14 (×7): 50 mg via ORAL
  Filled 2022-05-07 (×8): qty 1

## 2022-05-07 MED ORDER — POTASSIUM CHLORIDE 10 MEQ/100ML IV SOLN
10.0000 meq | INTRAVENOUS | Status: AC
  Administered 2022-05-07 (×4): 10 meq via INTRAVENOUS
  Filled 2022-05-07: qty 100

## 2022-05-07 MED ORDER — VITAMIN C 500 MG PO TABS
250.0000 mg | ORAL_TABLET | Freq: Every day | ORAL | Status: DC
  Administered 2022-05-07 – 2022-05-15 (×9): 250 mg via ORAL
  Filled 2022-05-07 (×9): qty 1

## 2022-05-07 MED ORDER — ZINC SULFATE 220 (50 ZN) MG PO CAPS
220.0000 mg | ORAL_CAPSULE | Freq: Every day | ORAL | Status: DC
  Administered 2022-05-07 – 2022-05-15 (×9): 220 mg via ORAL
  Filled 2022-05-07 (×9): qty 1

## 2022-05-07 MED ORDER — LIP MEDEX EX OINT
TOPICAL_OINTMENT | CUTANEOUS | Status: DC | PRN
  Filled 2022-05-07: qty 7

## 2022-05-07 MED ORDER — RISAQUAD PO CAPS
1.0000 | ORAL_CAPSULE | Freq: Every day | ORAL | Status: DC
  Administered 2022-05-07 – 2022-05-15 (×9): 1 via ORAL
  Filled 2022-05-07 (×9): qty 1

## 2022-05-07 MED ORDER — POLYVINYL ALCOHOL 1.4 % OP SOLN
1.0000 [drp] | Freq: Every day | OPHTHALMIC | Status: DC | PRN
  Filled 2022-05-07: qty 15

## 2022-05-07 NOTE — Evaluation (Signed)
Clinical/Bedside Swallow Evaluation Patient Details  Name: Lee Nelson MRN: [id number] Date of Birth: January 13, 1941  Today's Date: 05/07/2022 Time: SLP Start Time (ACUTE ONLY): 42 SLP Stop Time (ACUTE ONLY): 1140 SLP Time Calculation (min) (ACUTE ONLY): 60 min  Past Medical History:  Past Medical History:  Diagnosis Date   Anxiety    Arthritis    Bladder calculus    BPH (benign prostatic hyperplasia)    Chronic constipation    Complication of anesthesia    " I had some coughing afterwards for a couple of days"--  per pt "perfers spinal anesthesia since general anesthesia congnitive issues when older"   Diverticulosis of colon    Dry eye syndrome of both eyes    Environmental allergies    GERD (gastroesophageal reflux disease)    occasional   History of adenomatous polyp of colon    08/ 2004   History of kidney stones    History of squamous cell carcinoma in situ (SCCIS) of skin    s/p  excision 2013 facial areas and 06/ 2016 nose   Migraine    eye migraine occasional   Seasonal and perennial allergic rhinitis    Thrombocytopenia (HCC)    Tingling    hands and feet bilat , intermittantly-- per pt has lumbar bulging disk   Urinary frequency    Vocal fold atrophy    dysphonia-- per pt has to drink large amount of water to take even on pill   Wears glasses    Past Surgical History:  Past Surgical History:  Procedure Laterality Date   APPENDECTOMY  child   CARDIOVASCULAR STRESS TEST  05-17-2015  dr hilty   Low risk nuclear study w/ no ischemia/  normal LV function and wall motion , stress ef 54% (lvef 45-54%)   CHOLECYSTECTOMY N/A 09/29/2014   Procedure: LAPAROSCOPIC CHOLECYSTECTOMY WITH INTRAOPERATIVE CHOLANGIOGRAM;  Surgeon: Alphonsa Overall, MD;  Location: [facility];  Service: General;  Laterality: N/A;   COLONOSCOPY  last one 09-06-2010   ESOPHAGOGASTRODUODENOSCOPY N/A 12/09/2013   Procedure: ESOPHAGOGASTRODUODENOSCOPY (EGD);  Surgeon: Arta Silence, MD;  Location: [facility]  ENDOSCOPY;  Service: Endoscopy;  Laterality: N/A;   EXTRACORPOREAL SHOCK WAVE LITHOTRIPSY  yrs ago   INGUINAL HERNIA REPAIR Left child   inguinal hernia repair  33/8250   NISSEN FUNDOPLICATION  5397'Q   open   STONE EXTRACTION WITH BASKET N/A 08/18/2016   Procedure: STONE EXTRACTION WITH BASKET;  Surgeon: Carolan Clines, MD;  Location: [facility];  Service: Urology;  Laterality: N/A;   THULIUM LASER TURP (TRANSURETHRAL RESECTION OF PROSTATE) N/A 08/18/2016   Procedure: THULIUM LASER BLADDER NECK INCISION AND BLADDER STONE REMOVAL;  Surgeon: Carolan Clines, MD;  Location: [facility];  Service: Urology;  Laterality: N/A;   TONSILLECTOMY  child   TRANSTHORACIC ECHOCARDIOGRAM  11/18/2010   grade 1 diastolic dysfunction, ef 73-41%/  trivial MR and TR/ mild dilated RA   HPI:  82 yo male adm to [facility] - with AMS, He slipped OOB, tested + for COVID, bladder ca- pt denies states its urinary retention, dementia, GERD s/p Nissan- faint reflux on DG esophagram 2012, vocal fold atrophy- takes lots of liquids to swallow pill, skin cancer in situ hx, 2 days weakness, cxr concerning for aspiration- Mucus debris or aspirated content noted in the right mainstem bronchus extending into the bronchus intermedius and right lower lobe bronchi. There is impaction of a right lower lobe bronchus, Infiltration of the fat surrounding the esophagus. ? esophagitis. FEES 2016  showed retention that was improved with chin tuck posture.   MBS done at [facility] 11/2021 showed laryngeal penetration of thin intermittently, incomplete airway closure, residual worse with increased viscocity,  Also question impaired distention of UES, with decreased oral propulsion.  Pt reports weight loss due to ABX - 20 pounds - and unable to put back on the weight.  Pt also reports problems with tooth and nasal drainage.  Pt has muscle tension dysphonia per notes from [facility] - likely contributing to his dysphagia.   Swallow eval ordered.    Assessment / Plan / Recommendation  Clinical Impression  Patient presents with clinical indications of oropharyngeal dysphagia with suspected esophageal component.  Today his voice is hoarse but cough is strong and productive.  Pt observed with intake including water, thin cranberry juice, applesauce, and cracker. He is demonstrating delayed and multiple swallows across all boluses followed by delayed intermittent cough with thin liquids.  Pt endorses sensation of pharyngeal retention - pointing to distal pharynx once and distal pharynx on the right x1. SLP questions if pt's muscle tension dysphonia may contribute to his dysphagia as well.  Fortunately he is able to cough and expectorate during session - which is very helpful for airway protection.    Pt reports he has a "system" of eating - where he takes 3 sips of liquids and follows solids with liquids.  Pt reports progressive weight loss -   FEES 2016.    MBS 11/2021 done at [facility] showed pharyngeal retention  - worse with increased viscocity; incomplete airway closure resulted in incr intermitent penetration  of thin recommended diet was regular/thin.  Suspect pt has some degree of chronic aspiration - which he manages.  Discussed with pt concern for level of chronic aspiration and his full code status and advised he speak to MD.  He denies dysphagia worsening and has specific compensation strategies that he uses.    Options include to initiate diet of dys3/thin with strict precautions, compensation strategies and/or conduct MBS to allow instrumental assessment.  Advised pt that he may benefit from code status being discussed given his chronic dysphagia and progressive weight loss.   Of note, per chart review, pt was given exercises to complete from SLP at [facility], which he admits not consistently performing.     Pt requested SLP send message to MD re: swallow evaluation options/opinions.   SLP Visit Diagnosis:  Dysphagia, oropharyngeal phase (R13.12);Dysphagia, pharyngoesophageal phase (R13.14)    Aspiration Risk  Moderate aspiration risk    Diet Recommendation Dysphagia 3 (Mech soft);Thin liquid   Liquid Administration via: Cup;No straw Medication Administration: Whole meds with puree Supervision: Patient able to self feed Compensations: Small sips/bites;Slow rate Postural Changes: Seated upright at 90 degrees;Remain upright for at least 30 minutes after po intake    Other  Recommendations Oral Care Recommendations: Oral care BID    Recommendations for follow up therapy are one component of a multi-disciplinary discharge planning process, led by the attending physician.  Recommendations may be updated based on patient status, additional functional criteria and insurance authorization.  Follow up Recommendations Other (comment) (tbd)      Assistance Recommended at Discharge  full  Functional Status Assessment Patient has had a recent decline in their functional status and demonstrates the ability to make significant improvements in function in a reasonable and predictable amount of time.  Frequency and Duration min 1 x/week  1 week       Prognosis Prognosis for improved oropharyngeal function: Fair Barriers  to Reach Goals: Time post onset      Swallow Study   General Date of Onset:  (years) HPI: 82 yo male adm to [facility] - with AMS, He slipped OOB, tested + for COVID, bladder ca- pt denies states its urinary retention, dementia, GERD s/p Nissan- faint reflux on DG esophagram 2012, vocal fold atrophy- takes lots of liquids to swallow pill, skin cancer in situ hx, 2 days weakness, cxr concerning for aspiration- Mucus debris or aspirated content noted in the right mainstem bronchus extending into the bronchus intermedius and right lower lobe bronchi. There is impaction of a right lower lobe bronchus, Infiltration of the fat surrounding the esophagus. ? esophagitis. FEES 2016 showed retention  that was improved with chin tuck posture.   MBS done at [facility] 11/2021 showed laryngeal penetration of thin intermittently, incomplete airway closure, residual worse with increased viscocity,  Also question impaired distention of UES, with decreased oral propulsion.  Pt reports weight loss due to ABX - 20 pounds - and unable to put back on the weight.  Pt also reports problems with tooth and nasal drainage.  Pt has muscle tension dysphonia per notes from [facility] - likely contributing to his dysphagia.  Swallow eval ordered. Type of Study: Bedside Swallow Evaluation Previous Swallow Assessment: DG esophagram, FEES study 2016/3 penetration, chin tuck helped to decrease retention Diet Prior to this Study: NPO Temperature Spikes Noted: No Respiratory Status: Nasal cannula (4 liters) History of Recent Intubation: No Behavior/Cognition: Alert;Cooperative Oral Cavity Assessment: Within Functional Limits Oral Care Completed by SLP: No Oral Cavity - Dentition: Adequate natural dentition Vision: Functional for self-feeding Self-Feeding Abilities: Able to feed self Patient Positioning: Upright in bed Baseline Vocal Quality: Normal Volitional Cough: Strong Volitional Swallow: Able to elicit    Oral/Motor/Sensory Function Overall Oral Motor/Sensory Function: Within functional limits (neck extension noted)   Ice Chips Ice chips: Within functional limits Presentation: Spoon   Thin Liquid Thin Liquid: Impaired Presentation: Cup;Self Fed Pharyngeal  Phase Impairments: Multiple swallows;Cough - Delayed;Throat Clearing - Immediate    Nectar Thick Nectar Thick Liquid: Impaired Presentation: Cup;Self Fed;Spoon Pharyngeal Phase Impairments: Multiple swallows;Throat Clearing - Delayed   Honey Thick Honey Thick Liquid: Not tested   Puree Puree: Impaired Presentation: Spoon;Self Fed Pharyngeal Phase Impairments: Multiple swallows   Solid     Solid: Impaired Presentation: Self Fed Oral Phase Functional  Implications: Impaired mastication Pharyngeal Phase Impairments: Multiple swallows;Cough - Delayed      [name] 05/07/2022,12:47 PM   Kathleen Lime, MS [employer] Office [phone]

## 2022-05-07 NOTE — Evaluation (Signed)
Occupational Therapy Evaluation Patient Details Name: Lee Nelson MRN: [id number] DOB: [birth year] Today's Date: 05/07/2022   History of Present Illness Patient is a 82 year old male who presented with severe sepsis secondary to COVID 19 pneumonia, acute hypoxemic respiratory failure, hypokalemia.   PMH: GERD, PSVT, chronic thrombocytopenia, skin squamous cell carcinoma, dementia, anxiety, arthritis, osteoporosis, normal pressure hydrocephalus.   Clinical Impression   Patient is a 82 year old male who was admitted for above. Patient was living at home with wife with independence at prior level. Currently, patient is min A for transfers, max A for  LB dressing/bathing tasks. Patient was noted to have decreased functional activity tolerance, decreased endurance, decreased standing balance, decreased safety awareness, and decreased knowledge of AD/AE impacting participation in ADLs. Patient would continue to benefit from skilled OT services at this time while admitted and after d/c to address noted deficits in order to improve overall safety and independence in ADLs.       Recommendations for follow up therapy are one component of a multi-disciplinary discharge planning process, led by the attending physician.  Recommendations may be updated based on patient status, additional functional criteria and insurance authorization.   Follow Up Recommendations  Skilled nursing-short term rehab (<3 hours/day)     Assistance Recommended at Discharge Frequent or constant Supervision/Assistance  Patient can return home with the following A little help with walking and/or transfers;A lot of help with bathing/dressing/bathroom;Assistance with cooking/housework;Direct supervision/assist for medications management;Assist for transportation;Help with stairs or ramp for entrance;Direct supervision/assist for financial management    Functional Status Assessment  Patient has had a recent decline in their  functional status and demonstrates the ability to make significant improvements in function in a reasonable and predictable amount of time.  Equipment Recommendations  Other (comment) (defer to next venue)    Recommendations for Other Services       Precautions / Restrictions Precautions Precaution Comments: monitor O2 Restrictions Weight Bearing Restrictions: No      Mobility Bed Mobility Overal bed mobility: Needs Assistance Bed Mobility: Supine to Sit     Supine to sit: Mod assist     General bed mobility comments: with physical A to move BLE to EOB, and transition trunk into midline    Transfers                          Balance                                           ADL either performed or assessed with clinical judgement   ADL Overall ADL's : Needs assistance/impaired Eating/Feeding: Sitting;Set up   Grooming: Set up;Sitting   Upper Body Bathing: Minimal assistance;Sitting   Lower Body Bathing: Maximal assistance;Sitting/lateral leans Lower Body Bathing Details (indicate cue type and reason): unable to complete figure four positioning. Upper Body Dressing : Set up;Sitting   Lower Body Dressing: Maximal assistance;Sitting/lateral leans   Toilet Transfer: Minimal assistance;Ambulation;Rolling walker (2 wheels) Toilet Transfer Details (indicate cue type and reason): with increased A and cues for leaning nose over toes for sit to stand. Toileting- Clothing Manipulation and Hygiene: Sit to/from stand;Maximal assistance Toileting - Clothing Manipulation Details (indicate cue type and reason): needed BUE support for standing             Vision  Perception     Praxis      Pertinent Vitals/Pain Pain Assessment Pain Assessment: Faces Faces Pain Scale: Hurts a little bit Pain Location: L heel Pain Descriptors / Indicators: Discomfort, Constant Pain Intervention(s): Limited activity within patient's tolerance,  Monitored during session, Premedicated before session     Hand Dominance     Extremity/Trunk Assessment Upper Extremity Assessment Upper Extremity Assessment: Overall WFL for tasks assessed   Lower Extremity Assessment Lower Extremity Assessment: Defer to PT evaluation   Cervical / Trunk Assessment Cervical / Trunk Assessment: Kyphotic   Communication Communication Communication: No difficulties   Cognition Arousal/Alertness: Awake/alert Behavior During Therapy: WFL for tasks assessed/performed Overall Cognitive Status: Difficult to assess                                 General Comments: patient was noted to have a difficult time with processing which breathing device does which. patient knew he was int he hospital, was unable to remember wife's phone number.     General Comments       Exercises     Shoulder Instructions      Home Living Family/patient expects to be discharged to:: Private residence Living Arrangements: Spouse/significant other Available Help at Discharge: Family;Available 24 hours/day Type of Home: House Home Access: Ramped entrance     Home Layout: Two level;Able to live on main level with bedroom/bathroom     Bathroom Shower/Tub: Walk-in shower         Home Equipment: Kasandra Knudsen - single point          Prior Functioning/Environment Prior Level of Function : Independent/Modified Independent                        OT Problem List: Pain;Decreased activity tolerance;Impaired balance (sitting and/or standing);Decreased coordination;Decreased safety awareness;Decreased knowledge of precautions;Decreased knowledge of use of DME or AE      OT Treatment/Interventions: Self-care/ADL training;Energy conservation;Therapeutic exercise;DME and/or AE instruction;Therapeutic activities;Patient/family education;Balance training    OT Goals(Current goals can be found in the care plan section) Acute Rehab OT Goals Patient Stated  Goal: to get home OT Goal Formulation: With patient Time For Goal Achievement: 05/21/22 Potential to Achieve Goals: Fair  OT Frequency: Min 2X/week    Co-evaluation              AM-PAC OT "6 Clicks" Daily Activity     Outcome Measure Help from another person eating meals?: A Little Help from another person taking care of personal grooming?: A Little Help from another person toileting, which includes using toliet, bedpan, or urinal?: A Lot Help from another person bathing (including washing, rinsing, drying)?: A Lot Help from another person to put on and taking off regular upper body clothing?: A Little Help from another person to put on and taking off regular lower body clothing?: A Lot 6 Click Score: 15   End of Session Equipment Utilized During Treatment: Gait belt;Rolling walker (2 wheels) Nurse Communication: Other (comment) (ok to participate in session, updated on O2 during session)  Activity Tolerance: Patient tolerated treatment well Patient left: in chair;with call bell/phone within reach;with chair alarm set  OT Visit Diagnosis: Unsteadiness on feet (R26.81);Other abnormalities of gait and mobility (R26.89)                Time: 3474-2595 OT Time Calculation (min): 28 min Charges:  OT General Charges $OT Visit: 1 Visit  OT Evaluation $OT Eval Moderate Complexity: 1 Mod OT Treatments $Self Care/Home Management : 8-22 mins  Rennie Plowman, MS Acute Rehabilitation Department Office# [phone]   Willa Rough 05/07/2022, 3:32 PM

## 2022-05-07 NOTE — NC FL2 (Signed)
[place] LEVEL OF CARE FORM     IDENTIFICATION  Patient Name: Lee Nelson Birthdate: 1941-03-10 Sex: male Admission Date (Current Location): 05/06/2022  [facility] and [place] Number:  Herbalist and Address:  [facility],  [address], [place]      Provider Number: [id number]  Attending Physician Name and Address:  Flora Lipps, MD  Relative Name and Phone Number:  [name] (spouse) [phone]    Current Level of Care: Hospital Recommended Level of Care: [place] Prior Approval Number:    Date Approved/Denied:   PASRR Number: [id number]  Discharge Plan: SNF    Current Diagnoses: Patient Active Problem List   Diagnosis Date Noted   Pneumonia due to COVID-19 virus 05/06/2022   Severe sepsis ([place]) 05/06/2022   Acute hypoxemic respiratory failure ([place]) 05/06/2022   NPH (normal pressure hydrocephalus) ([place]) 05/06/2022   Physical deconditioning 05/06/2022   Thrombocytopenia ([place]) 05/06/2022   Dementia ([place]) 05/06/2022   SBO (small bowel obstruction) ([place]) 12/31/2019   Amaurosis fugax of right eye 08/25/2017   PSVT (paroxysmal supraventricular tachycardia) 09/12/2015   Chest pain 05/03/2015   Dyspnea 05/03/2015   ALLERGIC RHINITIS 09/21/2007   G E R D 09/21/2007   SMOKE INHALATION 09/21/2007   OSTEOPOROSIS 01/28/2007   PALPITATIONS 01/28/2007   COUGH 01/28/2007   ALLERGY 01/28/2007    Orientation RESPIRATION BLADDER Height & Weight     Self  Normal Incontinent Weight: 55 kg Height:  5\' 8"  (172.7 cm)  BEHAVIORAL SYMPTOMS/MOOD NEUROLOGICAL BOWEL NUTRITION STATUS      Incontinent Diet (Dysphagia 3 (Mechanical Soft))  AMBULATORY STATUS COMMUNICATION OF NEEDS Skin   Limited Assist Verbally Bruising, Skin abrasions (skin dry and flaky;ecchymosis and scratches to right arm/back)                       Personal Care Assistance Level of Assistance  Bathing, Feeding, Dressing Bathing  Assistance: Limited assistance Feeding assistance: Limited assistance Dressing Assistance: Limited assistance     Functional Limitations Info  Sight, Hearing, Speech Sight Info: Impaired (glasses) Hearing Info: Impaired (bilateral hearing aids) Speech Info: Adequate    SPECIAL CARE FACTORS FREQUENCY  PT (By licensed PT), OT (By licensed OT)     PT Frequency: 5x/week OT Frequency: 5x/week            Contractures Contractures Info: Not present    Additional Factors Info  Code Status, Allergies Code Status Info: full Allergies Info: Ciprofloxacin, Flagyl (Metronidazole), Metoclopramide Hcl, Other, Propofol, Risperidone, Silodosin, Soy Allergy, Sulfamethoxazole-trimethoprim           Current Medications (05/07/2022):  This is the current hospital active medication list Current Facility-Administered Medications  Medication Dose Route Frequency Provider Last Rate Last Admin   acetaminophen (TYLENOL) suppository 650 mg  650 mg Rectal Q6H PRN Shela Leff, MD   650 mg at 05/06/22 2158   acidophilus (RISAQUAD) capsule 1 capsule  1 capsule Oral Daily Pokhrel, Laxman, MD   1 capsule at 05/07/22 1330   Ampicillin-Sulbactam (UNASYN) 3 g in sodium chloride 0.9 % 100 mL IVPB  3 g Intravenous Q6H Poindexter, Leann T, RPH 200 mL/hr at 05/07/22 0940 3 g at 05/07/22 0940   ascorbic acid (VITAMIN C) tablet 250 mg  250 mg Oral Daily Pokhrel, Laxman, MD   250 mg at 05/07/22 1331   cholecalciferol (VITAMIN D3) tablet 2,000 Units  2,000 Units Oral Daily Pokhrel, Laxman, MD   2,000 Units  at 05/07/22 1330   dexamethasone (DECADRON) injection 6 mg  6 mg Intravenous Q24H Shela Leff, MD   6 mg at 05/06/22 2140   docusate sodium (COLACE) capsule 50 mg  50 mg Oral QHS Pokhrel, Laxman, MD       enoxaparin (LOVENOX) injection 40 mg  40 mg Subcutaneous Q24H Shela Leff, MD   40 mg at 05/06/22 2139   lip balm (CARMEX) ointment   Topical PRN Pokhrel, Laxman, MD       polyvinyl alcohol  (LIQUIFILM TEARS) 1.4 % ophthalmic solution 1 drop  1 drop Both Eyes Daily PRN Pokhrel, Laxman, MD       potassium chloride 10 mEq in 100 mL IVPB  10 mEq Intravenous Q1 Hr x 4 Pokhrel, Laxman, MD 100 mL/hr at 05/07/22 1441 10 mEq at 05/07/22 1441   remdesivir 100 mg in sodium chloride 0.9 % 100 mL IVPB  100 mg Intravenous Daily Shela Leff, MD 200 mL/hr at 05/07/22 1202 100 mg at 05/07/22 1202   zinc sulfate capsule 220 mg  220 mg Oral Daily Pokhrel, Laxman, MD   220 mg at 05/07/22 1331     Discharge Medications: Please see discharge summary for a list of discharge medications.  Relevant Imaging Results:  Relevant Lab Results:   Additional Information SSN [id number]  Henrietta Dine, RN

## 2022-05-07 NOTE — TOC Initial Note (Signed)
Transition of Care [facility]) - Initial/Assessment Note    Patient Details  Name: Lee Nelson MRN: [id number] Date of Birth: 03/15/1941  Transition [facility]) CM/SW Contact:    Lee Dine, RN Phone Number: 05/07/2022, 3:12 PM  Clinical Narrative:                 [facility] consult for PT recc SNF; pt oriented to self; contacted pt's wife Lee Nelson [phone]); she says pt is from home but agrees to SNF at d/c; she says pt does not experience IPV, food insecurity, or difficulty paying utilities; she says pt has transportation; Mrs Lee Nelson says pt wears glasses and bilateral hearing aids; he has a cane and walker; he has bars in his shower; she says pt does not have [place] services or home oxygen; explained SNF process; she would like SNF search limited to [facility]; [facility] completed and faxed out; awaiting bed offers.  Expected Discharge Plan: Skilled Nursing Facility Barriers to Discharge: Continued Medical Work up   Patient Goals and CMS Choice Patient states their goals for this hospitalization and ongoing recovery are:: SNF per pt's wife CMS Medicare.gov Compare Post Acute Care list provided to:: Patient Represenative (must comment) Lee Nelson (spouse))        Expected Discharge Plan and Services   Discharge Planning Services: CM Consult   Living arrangements for the past 2 months: Single Family Home                                      Prior Living Arrangements/Services Living arrangements for the past 2 months: Single Family Home Lives with:: Spouse Patient language and need for interpreter reviewed:: Yes Do you feel safe going back to the place where you live?: Yes      Need for Family Participation in Patient Care: Yes (Comment) Care giver support system in place?: Yes (comment) Current home services: DME (cane, walker) Criminal Activity/Legal Involvement Pertinent to Current Situation/Hospitalization: No - Comment as needed  Activities of  Daily Living Home Assistive Devices/Equipment: None ADL Screening (condition at time of admission) Patient's cognitive ability adequate to safely complete daily activities?: No Is the patient deaf or have difficulty hearing?: No Does the patient have difficulty seeing, even when wearing glasses/contacts?: No Does the patient have difficulty concentrating, remembering, or making decisions?: No Patient able to express need for assistance with ADLs?: Yes Does the patient have difficulty dressing or bathing?: Yes Independently performs ADLs?: No Communication: Independent Dressing (OT): Independent Grooming: Independent Feeding: Independent Bathing: Independent Toileting: Independent In/Out Bed: Independent Walks in Home: Needs assistance Is this a change from baseline?: Pre-admission baseline Does the patient have difficulty walking or climbing stairs?: Yes Weakness of Legs: Both Weakness of Arms/Hands: Both  Permission Sought/Granted Permission sought to share information with : Case Manager Permission granted to share information with : Yes, Verbal Permission Granted  Share Information with NAME: Lee Coffin, RN, CM     Permission granted to share info w Relationship: Lee Nelson (spouse) [phone]     Emotional Assessment   Attitude/Demeanor/Rapport: Unable to Assess   Orientation: : Oriented to Self Alcohol / Substance Use: Not Applicable Psych Involvement: No (comment)  Admission diagnosis:  Weakness [R53.1] Pneumonia due to COVID-19 virus [U07.1, J12.82] COVID-19 [U07.1] Patient Active Problem List   Diagnosis Date Noted   Pneumonia due to COVID-19 virus 05/06/2022   Severe sepsis ([place]) 05/06/2022  Acute hypoxemic respiratory failure (HCC) 05/06/2022   NPH (normal pressure hydrocephalus) ([place]) 05/06/2022   Physical deconditioning 05/06/2022   Thrombocytopenia ([place]) 05/06/2022   Dementia ([place]) 05/06/2022   SBO (small bowel obstruction) ([place]) 12/31/2019    Amaurosis fugax of right eye 08/25/2017   PSVT (paroxysmal supraventricular tachycardia) 09/12/2015   Chest pain 05/03/2015   Dyspnea 05/03/2015   ALLERGIC RHINITIS 09/21/2007   G E R D 09/21/2007   SMOKE INHALATION 09/21/2007   OSTEOPOROSIS 01/28/2007   PALPITATIONS 01/28/2007   COUGH 01/28/2007   ALLERGY 01/28/2007   PCP:  Lee Solian, MD Pharmacy:   [facility] [place], [place] [address] AT [place] [address] [place] [address] Phone: [phone] Fax: [phone]     Social Determinants of Health ([place]) Social History: SDOH Screenings   Food Insecurity: No Food Insecurity (05/06/2022)  Housing: Low Risk  (05/06/2022)  Transportation Needs: No Transportation Needs (05/06/2022)  Utilities: Not At Risk (05/06/2022)  Tobacco Use: Low Risk  (05/06/2022)   SDOH Interventions: Food Insecurity Interventions: Intervention Not Indicated Housing Interventions: Intervention Not Indicated Transportation Interventions: Intervention Not Indicated Utilities Interventions: Intervention Not Indicated   Readmission Risk Interventions     No data to display

## 2022-05-07 NOTE — Progress Notes (Signed)
PROGRESS NOTE    Lee Nelson  FXT:[id number] DOB: 12-12-1940 DOA: 05/06/2022 PCP: Prince Solian, MD    Brief Narrative:  Lee Nelson is a 82 y.o. male with medical history significant of dementia, anxiety, arthritis, osteoporosis, BPH, nephrolithiasis, history of skin squamous cell carcinoma status post excision, PSVT, GERD, chronic thrombocytopenia presented to the ED for evaluation of generalized weakness, sore throat, and fever for 2 days with generalized weakness..  Tested positive for COVID.  Patient also slipped out of his bed.  Of note patient had been non ambulatory recently and had been having difficulty completing his ADLs.  Patient was referred to the ED by his primary care physician.  In the ED, patient was febrile with a temperature of 102.6.  Fahrenheit and tachycardic and pulse ox was 80% on room air.  Was placed on 3 L of oxygen with improvement.  Laboratory data showed no leukocytosis but mild thrombocytopenia at 103K, SARS-CoV-2 PCR positive.  CT head showing findings of normal pressure hydrocephalus.  CT of cervical, thoracic, and lumbar spine negative for acute finding.  CT angiogram disorder no evidence of pulmonary embolism but right lower lobe infiltrate with effusion.  Patient received 500 mL of normal saline bolus and was admitted hospital for further evaluation and treatment.  Assessment and plan.  Severe sepsis secondary to COVID-19 pneumonia versus possible aspiration pneumonia with acute hypoxemic respiratory failure   CT angiogram after chest negative for PE but with right-sided infiltrate with small effusion.  Continue remdesivir and Decadron.  BNP within normal limits.  Procalcitonin elevated at 2.4.  Will get speech therapy evaluation, aspiration precautions.  Airborne precautions.  Continue incentive spirometry.   Lactic acid was 0.8.  No leukocytosis but temperature max of 102.6 F.  Patient has been started empirically on Unasyn.  Will continue for now.   Patient is currently on 3 L of oxygen by nasal cannula.  Patient has high risk for aspiration as per the nursing staff.  Speech therapy to evaluate.  Will continue on IV fluids for now and keep NPO.  Blood cultures negative in less than 12 hours.  Hypokalemia.  Will replenish with IV potassium.  Check in AM.   Normal pressure hydrocephalus ED physician spoke with Dr. Malen Gauze neurology due to abnormal CT scan.  CT scan of the head is unchanged from previous and recommended outpatient neurosurgery follow-up.     Generalized weakness/physical deconditioning/fall risk Continue fall precautions.  Will get PT OT evaluation. Was able to do things by himself at home.    Chronic thrombocytopenia No signs of bleeding.  Will continue to monitor.   Dementia -Delirium precautions   Anxiety/BPH Stable at this time.  History of PSVT: Continue telemetry monitor.  GERD Not on medications.    DVT prophylaxis: enoxaparin (LOVENOX) injection 40 mg Start: 05/06/22 2200   Code Status:     Code Status: Full Code  Disposition: Home with home health likely in 1 to 2 days  Status is: Inpatient  Remains inpatient appropriate because: Acute hypoxic respiratory failure, IV antibiotic, right lower lobe pneumonia, possible aspiration, COVID Pneumonia   Family Communication:  None at bedside. I tried to call the patient's spouse without success.  Spoke with the patient's son on the phone and updated him about the clinical condition of the patient.  Consultants:  None  Procedures:  None  Antimicrobials:  Remdesivir Unasyn  Anti-infectives (From admission, onward)    Start     Dose/Rate Route Frequency Ordered Stop  05/07/22 1000  remdesivir 100 mg in sodium chloride 0.9 % 100 mL IVPB       See Hyperspace for full Linked Orders Report.   100 mg 200 mL/hr over 30 Minutes Intravenous Daily 05/06/22 2115 05/09/22 0959   05/06/22 2200  Ampicillin-Sulbactam (UNASYN) 3 g in sodium chloride 0.9 % 100  mL IVPB        3 g 200 mL/hr over 30 Minutes Intravenous Every 6 hours 05/06/22 2120     05/06/22 2115  remdesivir 200 mg in sodium chloride 0.9% 250 mL IVPB       See Hyperspace for full Linked Orders Report.   200 mg 580 mL/hr over 30 Minutes Intravenous Once 05/06/22 2115 05/06/22 2325   05/06/22 0000  nirmatrelvir & ritonavir (PAXLOVID, 300/100,) 20 x 150 MG & 10 x 100MG  TBPK        3 tablet Oral 2 times daily 05/06/22 1827 05/11/22 2359      Subjective:  Today, patient was seen and examined at bedside.  Patient complains of mild cough with productive yellow sputum production.  Nursing staff reported that he has been coughing a lot especially when eating and likely to be aspirating.  Denies any pain, nausea, vomiting, fever or chills.  Feels thirsty and dry in his oral cavity  Objective: Vitals:   05/06/22 2300 05/07/22 0315 05/07/22 0658 05/07/22 1101  BP:  (!) 114/59 120/61 123/61  Pulse:  71 68 75  Resp: 18 20 16 16   Temp:  97.7 F (36.5 C) 97.7 F (36.5 C) 98.6 F (37 C)  TempSrc:  Oral Oral Oral  SpO2:  96% 98% 97%  Weight:      Height:        Intake/Output Summary (Last 24 hours) at 05/07/2022 1131 Last data filed at 05/07/2022 0600 Gross per 24 hour  Intake 1314.31 ml  Output --  Net 1314.31 ml   Filed Weights   05/06/22 1727  Weight: 55 kg    Physical Examination: Body mass index is 18.44 kg/m.   General: not in obvious distress, Communicative, thinly built, HENT:   No scleral pallor or icterus noted. Oral mucosa is moist.  Chest:   Diminished breath sounds bilaterally.  Coarse breath sounds noted. CVS: S1 &S2 heard. No murmur.  Regular rate and rhythm. Abdomen: Soft, nontender, nondistended.  Bowel sounds are heard.   Extremities: No cyanosis, clubbing or edema.  Peripheral pulses are palpable. Psych: Alert, awake and oriented, normal mood CNS:  No cranial nerve deficits.  Power equal in all extremities.   Skin: Warm and dry.  No rashes noted.  Data  Reviewed:   CBC: Recent Labs  Lab 05/06/22 0955 05/07/22 0021  WBC 8.8 9.1  NEUTROABS 7.4  --   HGB 13.8 13.9  HCT 40.9 40.6  MCV 94.9 95.5  PLT 103* 99*    Basic Metabolic Panel: Recent Labs  Lab 05/06/22 0955 05/07/22 0021  NA 135 136  K 3.5 3.3*  CL 101 100  CO2 23 22  GLUCOSE 104* 97  BUN 23 21  CREATININE 0.74 0.77  CALCIUM 8.5* 8.0*    Liver Function Tests: Recent Labs  Lab 05/07/22 0021  AST 121*  ALT 35  ALKPHOS 44  BILITOT 1.1  PROT 5.4*  ALBUMIN 3.2*     Radiology Studies: CT Angio Chest PE W and/or Wo Contrast  Result Date: 05/06/2022 CLINICAL DATA:  Concern for pulmonary embolism. EXAM: CT ANGIOGRAPHY CHEST WITH CONTRAST TECHNIQUE: Multidetector CT imaging  of the chest was performed using the standard protocol during bolus administration of intravenous contrast. Multiplanar CT image reconstructions and MIPs were obtained to evaluate the vascular anatomy. RADIATION DOSE REDUCTION: This exam was performed according to the departmental dose-optimization program which includes automated exposure control, adjustment of the mA and/or kV according to patient size and/or use of iterative reconstruction technique. CONTRAST:  60mL OMNIPAQUE IOHEXOL 350 MG/ML SOLN COMPARISON:  Chest radiograph dated 05/06/2022. FINDINGS: Evaluation of this exam is limited due to respiratory motion artifact. Evaluation is also limited due to streak artifact caused by patient's arms and overlying support wires. Cardiovascular: There is no cardiomegaly or pericardial effusion. Mild atherosclerotic calcification of the thoracic aorta. No aneurysmal dilatation or dissection. The origins of the great vessels of the aortic arch appear patent. Evaluation of the pulmonary arteries is limited due to respiratory motion. No pulmonary artery embolus identified. Mediastinum/Nodes: No hilar or mediastinal adenopathy. There is infiltration of the fat surrounding the esophagus. Correlation with clinical  exam recommended to exclude esophagitis. No mediastinal fluid collection. Lungs/Pleura: Mild bronchiectasis. Clusters of ground-glass nodular densities predominantly involving the right lower lobe as well as patchy area of airspace density at the right lung base most consistent with aspiration or pneumonia. Patchy area of interstitial coarsening involving the right upper lobe, likely chronic. There are biapical subpleural scarring. There is a small right pleural effusion. No pneumothorax. Mucus debris or aspirated content noted in the right mainstem bronchus extending into the bronchus intermedius and right lower lobe bronchi. There is impaction of a right lower lobe bronchus. Upper Abdomen: A 5 cm cyst in the left lobe of the liver. Musculoskeletal: No acute osseous pathology. Review of the MIP images confirms the above findings. IMPRESSION: 1. No CT evidence of pulmonary artery embolus. 2. Right lower lobe aspiration or pneumonia. 3. Small right pleural effusion. 4. Infiltration of the fat surrounding the esophagus. Correlation with clinical exam recommended to exclude esophagitis. 5.  Aortic Atherosclerosis (ICD10-I70.0). Electronically Signed   By: Anner Crete M.D.   On: 05/06/2022 20:26   CT Cervical Spine Wo Contrast  Result Date: 05/06/2022 CLINICAL DATA:  Neck trauma. EXAM: CT CERVICAL SPINE WITHOUT CONTRAST TECHNIQUE: Multidetector CT imaging of the cervical spine was performed without intravenous contrast. Multiplanar CT image reconstructions were also generated. RADIATION DOSE REDUCTION: This exam was performed according to the departmental dose-optimization program which includes automated exposure control, adjustment of the mA and/or kV according to patient size and/or use of iterative reconstruction technique. COMPARISON:  None Available. FINDINGS: Alignment: Normal. Skull base and vertebrae: No acute fracture. No primary bone lesion or focal pathologic process. Soft tissues and spinal canal:  No prevertebral fluid or swelling. No visible canal hematoma. Disc levels:  Maintained Upper chest: Biapical pleural/parenchymal changes most likely scarring. IMPRESSION: No acute traumatic abnormalities. Electronically Signed   By: Sammie Bench M.D.   On: 05/06/2022 12:46   DG Chest Portable 1 View  Result Date: 05/06/2022 CLINICAL DATA:  Cough, weakness, and fever.  COVID positive. EXAM: PORTABLE CHEST 1 VIEW COMPARISON:  Chest x-ray dated April 19, 2017. FINDINGS: The heart size and mediastinal contours are within normal limits. Normal pulmonary vascularity. Chronic biapical pleural-parenchymal scarring again noted. No focal consolidation, pleural effusion, or pneumothorax. No acute osseous abnormality. IMPRESSION: No active disease. Electronically Signed   By: Titus Dubin M.D.   On: 05/06/2022 10:13   CT Thoracic Spine Wo Contrast  Result Date: 05/06/2022 CLINICAL DATA:  [place] out of bed.  COVID positive.  EXAM: CT THORACIC AND LUMBAR SPINE WITHOUT CONTRAST TECHNIQUE: Multidetector CT imaging of the thoracic and lumbar spine was performed without contrast. Multiplanar CT image reconstructions were also generated. RADIATION DOSE REDUCTION: This exam was performed according to the departmental dose-optimization program which includes automated exposure control, adjustment of the mA and/or kV according to patient size and/or use of iterative reconstruction technique. COMPARISON:  Lumbar spine x-rays dated April 14, 2022. MRI lumbar spine dated July 15, 2020. Chest x-ray dated April 19, 2017. FINDINGS: CT THORACIC SPINE FINDINGS Alignment: Normal. Vertebrae: No acute fracture or focal pathologic process. Osteopenia. Paraspinal and other soft tissues: Biapical pleuroparenchymal scarring. Right upper lobe bronchiectasis. Disc levels: Disc heights are relatively preserved. Minimal degenerative disc disease in the midthoracic spine. CT LUMBAR SPINE FINDINGS Segmentation: 5 lumbar type vertebrae.  Alignment: Mild dextroscoliosis.  No significant listhesis. Vertebrae: No acute fracture or focal pathologic process. Old healed fracture of the right L2 transverse process. Osteopenia. Paraspinal and other soft tissues: Aortoiliac atherosclerotic vascular disease. 5.1 cm simple cyst in the left liver. Few punctate calculi in the lower pole of the left kidney. Disc levels: Mild disc bulging at L4-L5 and L5-S1. Mild bilateral facet arthropathy from L3-L4 through L5-S1. IMPRESSION: 1. No acute osseous abnormality of the thoracic or lumbar spine. 2. Nonobstructive left nephrolithiasis. 3.  Aortic Atherosclerosis (ICD10-I70.0). Electronically Signed   By: Obie Dredge M.D.   On: 05/06/2022 10:08   CT Lumbar Spine Wo Contrast  Result Date: 05/06/2022 CLINICAL DATA:  [place] Se[place] out of bed.  COVID positive. EXAM: CT THORACIC AND LUMBAR SPINE WITHOUT CONTRAST TECHNIQUE: Multidetector CT imaging of the thoracic and lumbar spine was performed without contrast. Multiplanar CT image reconstructions were also generated. RADIATION DOSE REDUCTION: This exam was performed according to the departmental dose-optimization program which includes automated exposure control, adjustment of the mA and/or kV according to patient size and/or use of iterative reconstruction technique. COMPARISON:  Lumbar spine x-rays dated April 14, 2022. MRI lumbar spine dated July 15, 2020. Chest x-ray dated April 19, 2017. FINDINGS: CT THORACIC SPINE FINDINGS Alignment: Normal. Vertebrae: No acute fracture or focal pathologic process. Osteopenia. Paraspinal and other soft tissues: Biapical pleuroparenchymal scarring. Right upper lobe bronchiectasis. Disc levels: Disc heights are relatively preserved. Minimal degenerative disc disease in the midthoracic spine. CT LUMBAR SPINE FINDINGS Segmentation: 5 lumbar type vertebrae. Alignment: Mild dextroscoliosis.  No significant listhesis. Vertebrae: No acute fracture or focal pathologic process. Old healed  fracture of the right L2 transverse process. Osteopenia. Paraspinal and other soft tissues: Aortoiliac atherosclerotic vascular disease. 5.1 cm simple cyst in the left liver. Few punctate calculi in the lower pole of the left kidney. Disc levels: Mild disc bulging at L4-L5 and L5-S1. Mild bilateral facet arthropathy from L3-L4 through L5-S1. IMPRESSION: 1. No acute osseous abnormality of the thoracic or lumbar spine. 2. Nonobstructive left nephrolithiasis. 3.  Aortic Atherosclerosis (ICD10-I70.0). Electronically Signed   By: Obie Dredge M.D.   On: 05/06/2022 10:08   CT Head Wo Contrast  Result Date: 05/06/2022 CLINICAL DATA:  Head trauma. EXAM: CT HEAD WITHOUT CONTRAST TECHNIQUE: Contiguous axial images were obtained from the base of the skull through the vertex without intravenous contrast. RADIATION DOSE REDUCTION: This exam was performed according to the departmental dose-optimization program which includes automated exposure control, adjustment of the mA and/or kV according to patient size and/or use of iterative reconstruction technique. COMPARISON:  None Available. FINDINGS: Brain: There is periventricular white matter decreased attenuation consistent with small vessel ischemic changes. Ventricles, sulci  and cisterns are prominent consistent with age related involutional changes. No acute intracranial hemorrhage, mass effect or shift. There is ventricular prominence is out of proportion to the extent of involutional changes suggesting normal pressure hydrocephalus. Vascular: No hyperdense vessel or unexpected calcification. Skull: Normal. Negative for fracture or focal lesion. Sinuses/Orbits: There is a splenoid cyst or polyp measuring 1.1 cm. Mucoperiosteal thickening consistent with chronic maxillary and ethmoid sinusitis. IMPRESSION: 1. Atrophy and chronic small vessel ischemic changes. 2. Findings suggest normal pressure hydrocephalus. 3. Sequelae of chronic sinusitis. Electronically Signed   By:  Layla Maw M.D.   On: 05/06/2022 09:56      LOS: 1 day    Joycelyn Das, MD [facility] Available via Epic secure chat 7am-7pm After these hours, please refer to coverage provider listed on [email or web address] 05/07/2022, 11:31 AM

## 2022-05-07 NOTE — Evaluation (Signed)
Physical Therapy Evaluation Patient Details Name: Lee Nelson MRN: [id number] DOB: 1940/04/22 Today's Date: 05/07/2022  History of Present Illness  Patient is a 82 year old male who presented with severe sepsis secondary to COVID 19 pneumonia, acute hypoxemic respiratory failure, hypokalemia.   PMH: GERD, PSVT, chronic thrombocytopenia, skin squamous cell carcinoma, dementia, anxiety, arthritis, osteoporosis, normal pressure hydrocephalus.  Clinical Impression  Pt admitted with above diagnosis.  Pt currently with functional limitations due to the deficits listed below (see PT Problem List). Pt will benefit from skilled PT to increase their independence and safety with mobility to allow discharge to the venue listed below.   The patient  resting in recliner, reports his concern about his wife being at home and unsure if she is being cared for.  Patient required min assistance  for ambulation x 90'  around the room, using Rw. SPo2 on  RA 96%.  Patient  will benefit from furthe r mobility to improve balance and safety.      Recommendations for follow up therapy are one component of a multi-disciplinary discharge planning process, led by the attending physician.  Recommendations may be updated based on patient status, additional functional criteria and insurance authorization.  Follow Up Recommendations Skilled nursing-short term rehab (<3 hours/day) Can patient physically be transported by private vehicle: Yes    Assistance Recommended at Discharge Frequent or constant Supervision/Assistance  Patient can return home with the following  A little help with walking and/or transfers;A lot of help with bathing/dressing/bathroom;Assistance with cooking/housework;Assist for transportation;Help with stairs or ramp for entrance    Equipment Recommendations None recommended by PT  Recommendations for Other Services       Functional Status Assessment Patient has had a recent decline in their  functional status and demonstrates the ability to make significant improvements in function in a reasonable and predictable amount of time.     Precautions / Restrictions Precautions Precautions: Fall Precaution Comments: monitor O2 Restrictions Weight Bearing Restrictions: No      Mobility  Bed Mobility   Bed Mobility: Sit to Supine     Supine to sit: Min assist     General bed mobility comments: assist legs    Transfers Overall transfer level: Needs assistance Equipment used: Rolling walker (2 wheels) Transfers: Sit to/from Stand Sit to Stand: Min assist           General transfer comment: steady assit to stand    Ambulation/Gait Ambulation/Gait assistance: Min assist Gait Distance (Feet): 90 Feet Assistive device: Rolling walker (2 wheels) Gait Pattern/deviations: Step-through pattern Gait velocity: decr     General Gait Details: ambulated around room, having to turn and manage around  bed and  objects, did not lose balance. frequent cues for safety  Stairs            Wheelchair Mobility    Modified Rankin (Stroke Patients Only)       Balance Overall balance assessment: Needs assistance Sitting-balance support: No upper extremity supported, Feet supported Sitting balance-Leahy Scale: Fair     Standing balance support: Bilateral upper extremity supported, During functional activity, Reliant on assistive device for balance Standing balance-Leahy Scale: Poor                               Pertinent Vitals/Pain Pain Assessment Faces Pain Scale: Hurts little more Pain Location: legs Pain Descriptors / Indicators: Discomfort, Constant Pain Intervention(s): Monitored during session    Home  Living Family/patient expects to be discharged to:: Private residence Living Arrangements: Spouse/significant other Available Help at Discharge: Family;Available 24 hours/day Type of Home: House Home Access: Ramped entrance       Home  Layout: Two level;Able to live on main level with bedroom/bathroom Home Equipment: [place] - single point;Rolling Walker (2 wheels) Additional Comments: pt. wife is incomplete paraplegic, she has caregivers daytime per pt-? accuracy    Prior Function Prior Level of Function : Independent/Modified Independent                     Hand Dominance   Dominant Hand: Right    Extremity/Trunk Assessment   Upper Extremity Assessment Upper Extremity Assessment: Overall WFL for tasks assessed    Lower Extremity Assessment Lower Extremity Assessment: Generalized weakness    Cervical / Trunk Assessment Cervical / Trunk Assessment: Kyphotic  Communication   Communication: No difficulties  Cognition Arousal/Alertness: Awake/alert Behavior During Therapy: WFL for tasks assessed/performed Overall Cognitive Status: Difficult to assess                                 General Comments: patioent asking several time how he was doing, focused on not knowing what is going on at his home ,huis wife.        General Comments      Exercises     Assessment/Plan    PT Assessment Patient needs continued PT services  PT Problem List Decreased strength;Decreased activity tolerance;Decreased mobility;Decreased cognition;Decreased safety awareness;Decreased balance;Decreased knowledge of use of DME;Decreased knowledge of precautions;Pain       PT Treatment Interventions DME instruction;Therapeutic activities;Cognitive remediation;Balance training;Gait training;Functional mobility training;Therapeutic exercise;Patient/family education    PT Goals (Current goals can be found in the Care Plan section)  Acute Rehab PT Goals Patient Stated Goal: to go home PT Goal Formulation: Patient unable to participate in goal setting Time For Goal Achievement: 05/21/22 Potential to Achieve Goals: Fair    Frequency Min 2X/week     Co-evaluation               AM-PAC PT "6 Clicks"  Mobility  Outcome Measure Help needed turning from your back to your side while in a flat bed without using bedrails?: A Lot Help needed moving from lying on your back to sitting on the side of a flat bed without using bedrails?: A Lot Help needed moving to and from a bed to a chair (including a wheelchair)?: A Lot Help needed standing up from a chair using your arms (e.g., wheelchair or bedside chair)?: A Lot Help needed to walk in hospital room?: A Lot Help needed climbing 3-5 steps with a railing? : A Lot 6 Click Score: 12    End of Session Equipment Utilized During Treatment: Gait belt Activity Tolerance: Patient tolerated treatment well Patient left: in bed;with call bell/phone within reach;with nursing/sitter in room;with bed alarm set Nurse Communication: Mobility status PT Visit Diagnosis: Unsteadiness on feet (R26.81);Difficulty in walking, not elsewhere classified (R26.2);Pain Pain - Right/Left: Left Pain - part of body: Leg;Ankle and joints of foot    Time: 6659-9357 PT Time Calculation (min) (ACUTE ONLY): 38 min   Charges:   PT Evaluation $PT Eval Low Complexity: 1 Low PT Treatments $Gait Training: 8-22 mins $Self Care/Home Management: [place] Office [phone] Weekend pager-[phone]   Claretha Cooper 05/07/2022, 4:39 PM

## 2022-05-07 NOTE — Therapy (Incomplete)
OUTPATIENT PHYSICAL THERAPY TREATMENT   Patient Name: Lee Nelson MRN: [id number] DOB:08-01-1940, 82 y.o., male Today's Date: 04/30/2022  END OF SESSION:  PT End of Session - 04/30/22 1104     Visit Number 2    Number of Visits 20    Date for PT Re-Evaluation 07/04/22    Progress Note Due on Visit 10    PT Start Time 1055    PT Stop Time 1134    PT Time Calculation (min) 39 min    Activity Tolerance Patient tolerated treatment well    Behavior During Therapy WFL for tasks assessed/performed              Past Medical History:  Diagnosis Date   Anxiety    Arthritis    Bladder calculus    BPH (benign prostatic hyperplasia)    Chronic constipation    Complication of anesthesia    " I had some coughing afterwards for a couple of days"--  per pt "perfers spinal anesthesia since general anesthesia congnitive issues when older"   Diverticulosis of colon    Dry eye syndrome of both eyes    Environmental allergies    GERD (gastroesophageal reflux disease)    occasional   History of adenomatous polyp of colon    08/ 2004   History of kidney stones    History of squamous cell carcinoma in situ (SCCIS) of skin    s/p  excision 2013 facial areas and 06/ 2016 nose   Migraine    eye migraine occasional   Seasonal and perennial allergic rhinitis    Thrombocytopenia (HCC)    Tingling    hands and feet bilat , intermittantly-- per pt has lumbar bulging disk   Urinary frequency    Vocal fold atrophy    dysphonia-- per pt has to drink large amount of water to take even on pill   Wears glasses    Past Surgical History:  Procedure Laterality Date   APPENDECTOMY  child   CARDIOVASCULAR STRESS TEST  05-17-2015  dr hilty   Low risk nuclear study w/ no ischemia/  normal LV function and wall motion , stress ef 54% (lvef 45-54%)   CHOLECYSTECTOMY N/A 09/29/2014   Procedure: LAPAROSCOPIC CHOLECYSTECTOMY WITH INTRAOPERATIVE CHOLANGIOGRAM;  Surgeon: Alphonsa Overall, MD;  Location: [facility]  [facility];  Service: General;  Laterality: N/A;   COLONOSCOPY  last one 09-06-2010   ESOPHAGOGASTRODUODENOSCOPY N/A 12/09/2013   Procedure: ESOPHAGOGASTRODUODENOSCOPY (EGD);  Surgeon: Arta Silence, MD;  Location: [facility] ENDOSCOPY;  Service: Endoscopy;  Laterality: N/A;   EXTRACORPOREAL SHOCK WAVE LITHOTRIPSY  yrs ago   INGUINAL HERNIA REPAIR Left child   inguinal hernia repair  81/0175   NISSEN FUNDOPLICATION  1025'E   open   STONE EXTRACTION WITH BASKET N/A 08/18/2016   Procedure: STONE EXTRACTION WITH BASKET;  Surgeon: Carolan Clines, MD;  Location: [facility];  Service: Urology;  Laterality: N/A;   THULIUM LASER TURP (TRANSURETHRAL RESECTION OF PROSTATE) N/A 08/18/2016   Procedure: THULIUM LASER BLADDER NECK INCISION AND BLADDER STONE REMOVAL;  Surgeon: Carolan Clines, MD;  Location: [facility];  Service: Urology;  Laterality: N/A;   TONSILLECTOMY  child   TRANSTHORACIC ECHOCARDIOGRAM  11/18/2010   grade 1 diastolic dysfunction, ef 52-77%/  trivial MR and TR/ mild dilated RA   Patient Active Problem List   Diagnosis Date Noted   SBO (small bowel obstruction) ([place]) 12/31/2019   Amaurosis fugax of right eye 08/25/2017   PSVT (paroxysmal supraventricular tachycardia)  09/12/2015   Chest pain 05/03/2015   Dyspnea 05/03/2015   ALLERGIC RHINITIS 09/21/2007   G E R D 09/21/2007   SMOKE INHALATION 09/21/2007   OSTEOPOROSIS 01/28/2007   PALPITATIONS 01/28/2007   COUGH 01/28/2007   ALLERGY 01/28/2007    PCP: Prince Solian, MD   REFERRING PROVIDER: Donella Stade, PA-C   REFERRING DIAG: M54.50 (ICD-10-CM) - Low back pain, unspecified back pain laterality, unspecified chronicity, unspecified whether sciatica present  Rationale for Evaluation and Treatment: Rehabilitation  THERAPY DIAG:  Other low back pain  Muscle weakness (generalized)  History of falling  Unsteadiness on feet  Repeated falls  Balance disorder  ONSET DATE: years  ago  SUBJECTIVE:                                                                                                                                                                                           SUBJECTIVE STATEMENT: He indicated he was hurrying today and didn't bring the cane.  He mentioned no specific pain upon arrival today.  Reported some stretching   PERTINENT HISTORY:  Dyspnea, chest pain, PSVT, Osteoporosis, allergies, GERD, arthritis anxiety  PAIN:  NPRS scale: 3/10 Pain location: low back Pain description: achy, discomfort Aggravating factors: bending and lifting when assisting his wife Relieving factors: rest, occasional over the counter pain med  PRECAUTIONS: FALL  WEIGHT BEARING RESTRICTIONS: No  FALLS:  Has patient fallen in last 6 months? Yes. Number of falls 3  LIVING ENVIRONMENT: Lives with: lives with their family and lives with their spouse Lives in: House/apartmentSingle point cane Stairs: pt has ramp in back, and has a couple of steps to enter his front door.  Has following equipment at home:   OCCUPATION: retired Barista professor at [employer]: "I want to improve my posture and get stronger"     OBJECTIVE:   DIAGNOSTIC FINDINGS:  Result Narrative 04/17/22  AP and lateral views of the lumbar spine reviewed.  Moderate degenerative changes noted throughout the facet joints of the lumbar spine, particularly inferior lumbar spine.  Disc space narrowing is noted.  No acute fracture.  No significant spondylolisthesis.  PATIENT SURVEYS:  04/23/22: FOTO eval: 54% (predicted 62%)  SCREENING FOR RED FLAGS: Bowel or bladder incontinence: No Cauda equina syndrome: No  COGNITION: 04/23/22 Overall cognitive status: WFL normal      SENSATION: 04/23/22 [facility]  MUSCLE LENGTH: 04/23/22 Hamstrings: Right 58 deg; Left 64 deg   POSTURE: 04/23/22 rounded shoulders and forward head, forward trunk flexion,  decreased lumbar lordosis  PALPATION: 04/23/22 TTP: lumbar paraspinals, QL on Rt  LUMBAR  ROM:   AROM 04/23/22  Flexion 40 CGA to prevent LOB  Extension 5  Right lateral flexion 18  Left lateral flexion 14  Right rotation Limited 75%  Left rotation Limited  75 %   (Blank rows = not tested)  LOWER EXTREMITY ROM:     Active  Right Eval 04/23/22 Left Eval 04/23/22  Hip flexion 105 108  Hip extension    Hip abduction    Hip adduction    Hip internal rotation    Hip external rotation    Knee flexion    Knee extension     (Blank rows = not tested)  LOWER EXTREMITY MMT:    MMT Right 04/23/22 Left 04/23/22  Hip flexion 4/5 4/5  Hip extension    Hip abduction 4/5 4/5  Hip adduction 4/5 4/5  Hip internal rotation    Hip external rotation    Knee flexion 5/5 5/5  Knee extension 5/5 5/5  Ankle dorsiflexion 5/5 5/5  Ankle plantarflexion    Ankle inversion    Ankle eversion     (Blank rows = not tested)  LUMBAR SPECIAL TESTS:  04/23/22: Straight leg raise test: Negative and Slump test: Negative  FUNCTIONAL TESTS:  04/30/2022:     04/30/22 0001  Balance  Balance Assessed Yes  Standardized Balance Assessment  Standardized Balance Assessment Berg Balance Test  Berg Balance Test  Sit to Stand 4  Standing Unsupported 4  Sitting with Back Unsupported but Feet Supported on Floor or Stool 4  Stand to Sit 4  Transfers 4  Standing Unsupported with Eyes Closed 3  Standing Unsupported with Feet Together 3  From Standing, Reach Forward with Outstretched Arm 3  From Standing Position, Pick up Object from Floor 4  From Standing Position, Turn to Look Behind Over each Shoulder 2  Turn 360 Degrees 4  Standing Unsupported, Alternately Place Feet on Step/Stool 2  Standing Unsupported, One Foot in Front 3  Standing on One Leg 1  Total Score 45    04/23/22: 5 times sit to stand: 19 seconds no UE support  GAIT: 04/23/22 Distance walked: 30 feet on level surfaces from gym to  elevator Assistive device utilized: Single point cane Level of assistance: Modified independence Comments: shuffling gait pattern with forward flexed trunk  TODAY'S TREATMENT:                                                                                                        DATE:  05/07/22: TherEx: Nustep: UE/LE x 8 minutes Level 5 Standing trunk extension 2 x 10 each LE c UE support Standing hip abd: 2 x 10 each LE c UE support Sit to stand x 10 using UE's Supine: bridges x 10 c tactile and verbal cues Supine trunk rotation x 3 to each side     04/30/2022  Therex: Nustep UE/LE lvl 5 8 mins Supine lumbar trunk rotation 20 sec x 3 bilateral Standing green 2 x 15 bilateral rows Supine marching 2 x 10 Supine slr 2 x 10 bilateral Additional time spent in review  of HEP for techniques.   Physical Performance Testing: Additional time spent in performance of BERG balance testing c verbal and visual instruction for performance  TODAY'S TREATMENT:                                                                                                        DATE: 04/23/22  Therex:    HEP instruction/performance c cues for techniques, handout provided.  Trial set performed of each for comprehension and symptom assessment.  See below for exercise list  PATIENT EDUCATION:  Education details: HEP, POC Person educated: Patient Education method: Explanation, Demonstration, Verbal cues, and Handouts Education comprehension: verbalized understanding, returned demonstration, and verbal cues required  HOME EXERCISE PROGRAM: Access Code: [email or web address] URL: [email or web address] Date: 04/23/2022 Prepared by: Kearney Hard  Exercises - Supine Lower Trunk Rotation  - 2 x daily - 7 x weekly - 3 reps - 20 seconds hold - Standing Shoulder Row with Anchored Resistance  - 2 x daily - 7 x weekly - 15 reps - Supine March  - 2 x daily - 7 x weekly - 20 reps - Supine Active Straight Leg Raise  - 2  x daily - 7 x weekly - 10 reps  ASSESSMENT:  CLINICAL IMPRESSION:       OBJECTIVE IMPAIRMENTS: decreased activity tolerance, decreased balance, decreased mobility, difficulty walking, decreased ROM, and decreased strength.   ACTIVITY LIMITATIONS: bending, sitting, standing, sleeping, stairs, and transfers  PARTICIPATION LIMITATIONS: community activity  PERSONAL FACTORS: 3+ comorbidities: see above pertinent history  are also affecting patient's functional outcome.   REHAB POTENTIAL: Good  CLINICAL DECISION MAKING: Stable/uncomplicated  EVALUATION COMPLEXITY: Low   GOALS: Goals reviewed with patient? Yes  SHORT TERM GOALS: (target date for Short term goals are 3 weeks 05/16/22)  1. Patient will demonstrate independent use of home exercise program to maintain progress from in clinic treatments.  Goal status: on going 05/07/2022  LONG TERM GOALS: (target dates for all long term goals are 10 weeks  07/04/22 )   1. Patient will demonstrate/report pain at worst less than or equal to 2/10 to facilitate minimal limitation in daily activity secondary to pain symptoms.  Goal status: New   2. Patient will demonstrate independent use of home exercise program to facilitate ability to maintain/progress functional gains from skilled physical therapy services.  Goal status: New   3. Patient will demonstrate FOTO outcome > or = 62 % to indicate reduced disability due to condition.  Goal status: New   4. Pt will be able to lift 4 pounds from counter to over head shelf without LOB and no pain reported in his low back.   Goal status: New   5.  Pt will be able to improve his 5 time sit to stand to </= 14 seconds with no UE support.  Goal status: New   6.  Perform BERG balance test and improve score by >/= 5 points from baseline to decrease fall risk.  Goal status: New   PLAN:  PT FREQUENCY: 1-2x/week  PT DURATION: 10 weeks  PLANNED  INTERVENTIONS: Therapeutic exercises,  Therapeutic activity, Neuro Muscular re-education, Balance training, Gait training, Patient/Family education, Joint mobilization, Stair training, DME instructions, Dry Needling, Electrical stimulation, Cryotherapy, vasopneumatic device, Moist heat, Taping, Traction Ultrasound, Ionotophoresis 4mg /ml Dexamethasone, and Manual therapy.  All included unless contraindicated  PLAN FOR NEXT SESSION:  Review HEP as necessary.  Include static balance holds and progression as tolerated.     , PT, MPT 05/07/22 10:58 AM   04/30/22  11:54 AM

## 2022-05-07 NOTE — Progress Notes (Signed)
PHARMACY - PHYSICIAN COMMUNICATION CRITICAL VALUE ALERT - BLOOD CULTURE IDENTIFICATION (BCID)  Lee Nelson is an 82 y.o. male who presented to [facility] on 05/06/2022 with a chief complaint of COVID-19 pneumonia, possible aspiration pneumonia.    Assessment:  blood cx 1/4 bottles (anaerobic) with strep species, no resistance detected   Name of physician (or Provider) Contacted: Dr Louanne Belton  Current antibiotics: Unasyn, remdesivir  Changes to prescribed antibiotics recommended:  Patient is on recommended antibiotics - No changes needed  Results for orders placed or performed during the hospital encounter of 05/06/22  Blood Culture ID Panel (Reflexed) (Collected: 05/06/2022  9:43 PM)  Result Value Ref Range   Enterococcus faecalis NOT DETECTED NOT DETECTED   Enterococcus Faecium NOT DETECTED NOT DETECTED   Listeria monocytogenes NOT DETECTED NOT DETECTED   Staphylococcus species NOT DETECTED NOT DETECTED   Staphylococcus aureus (BCID) NOT DETECTED NOT DETECTED   Staphylococcus epidermidis NOT DETECTED NOT DETECTED   Staphylococcus lugdunensis NOT DETECTED NOT DETECTED   Streptococcus species DETECTED (A) NOT DETECTED   Streptococcus agalactiae NOT DETECTED NOT DETECTED   Streptococcus pneumoniae NOT DETECTED NOT DETECTED   Streptococcus pyogenes NOT DETECTED NOT DETECTED   A.calcoaceticus-baumannii NOT DETECTED NOT DETECTED   Bacteroides fragilis NOT DETECTED NOT DETECTED   Enterobacterales NOT DETECTED NOT DETECTED   Enterobacter cloacae complex NOT DETECTED NOT DETECTED   Escherichia coli NOT DETECTED NOT DETECTED   Klebsiella aerogenes NOT DETECTED NOT DETECTED   Klebsiella oxytoca NOT DETECTED NOT DETECTED   Klebsiella pneumoniae NOT DETECTED NOT DETECTED   Proteus species NOT DETECTED NOT DETECTED   Salmonella species NOT DETECTED NOT DETECTED   Serratia marcescens NOT DETECTED NOT DETECTED   Haemophilus influenzae NOT DETECTED NOT DETECTED   Neisseria meningitidis NOT  DETECTED NOT DETECTED   Pseudomonas aeruginosa NOT DETECTED NOT DETECTED   Stenotrophomonas maltophilia NOT DETECTED NOT DETECTED   Candida albicans NOT DETECTED NOT DETECTED   Candida auris NOT DETECTED NOT DETECTED   Candida glabrata NOT DETECTED NOT DETECTED   Candida krusei NOT DETECTED NOT DETECTED   Candida parapsilosis NOT DETECTED NOT DETECTED   Candida tropicalis NOT DETECTED NOT DETECTED   Cryptococcus neoformans/gattii NOT DETECTED NOT DETECTED    Gretta Arab PharmD, BCPS [facility] main pharmacy [phone] 05/07/2022  2:47 PM

## 2022-05-08 ENCOUNTER — Inpatient Hospital Stay (HOSPITAL_COMMUNITY): Payer: Medicare Other

## 2022-05-08 DIAGNOSIS — J9601 Acute respiratory failure with hypoxia: Secondary | ICD-10-CM | POA: Diagnosis not present

## 2022-05-08 DIAGNOSIS — U071 COVID-19: Secondary | ICD-10-CM | POA: Diagnosis not present

## 2022-05-08 DIAGNOSIS — D696 Thrombocytopenia, unspecified: Secondary | ICD-10-CM | POA: Diagnosis not present

## 2022-05-08 DIAGNOSIS — G912 (Idiopathic) normal pressure hydrocephalus: Secondary | ICD-10-CM | POA: Diagnosis not present

## 2022-05-08 DIAGNOSIS — F0390 Unspecified dementia without behavioral disturbance: Secondary | ICD-10-CM

## 2022-05-08 LAB — BASIC METABOLIC PANEL
Anion gap: 10 (ref 5–15)
BUN: 34 mg/dL — ABNORMAL HIGH (ref 8–23)
CO2: 24 mmol/L (ref 22–32)
Calcium: 8.2 mg/dL — ABNORMAL LOW (ref 8.9–10.3)
Chloride: 104 mmol/L (ref 98–111)
Creatinine, Ser: 0.64 mg/dL (ref 0.61–1.24)
GFR, Estimated: >60 mL/min (ref >60)
Glucose, Bld: 145 mg/dL — ABNORMAL HIGH (ref 70–99)
Potassium: 4 mmol/L (ref 3.5–5.1)
Sodium: 138 mmol/L (ref 135–145)

## 2022-05-08 LAB — CBC
HCT: 43.6 % (ref 39.0–52.0)
Hemoglobin: 14.8 g/dL (ref 13.0–17.0)
MCH: 32.2 pg (ref 26.0–34.0)
MCHC: 33.9 g/dL (ref 30.0–36.0)
MCV: 95 fL (ref 80.0–100.0)
Platelets: 113 10*3/uL — ABNORMAL LOW (ref 150–400)
RBC: 4.59 MIL/uL (ref 4.22–5.81)
RDW: 13.5 % (ref 11.5–15.5)
WBC: 10.6 10*3/uL — ABNORMAL HIGH (ref 4.0–10.5)
nRBC: 0 % (ref 0.0–0.2)

## 2022-05-08 LAB — MAGNESIUM: Magnesium: 2.1 mg/dL (ref 1.7–2.4)

## 2022-05-08 MED ORDER — GUAIFENESIN-DM 100-10 MG/5ML PO SYRP
5.0000 mL | ORAL_SOLUTION | ORAL | Status: DC | PRN
  Administered 2022-05-08 – 2022-05-09 (×3): 5 mL via ORAL
  Filled 2022-05-08 (×4): qty 10

## 2022-05-08 NOTE — Progress Notes (Signed)
PROGRESS NOTE    Lee Nelson  ZOX:[id number] DOB: 05/10/1940 DOA: 05/06/2022 PCP: Chilton Greathouse, MD    Brief Narrative:  Lee Nelson is a 82 y.o. male with medical history significant of dementia, anxiety, arthritis, osteoporosis, BPH, nephrolithiasis, history of skin squamous cell carcinoma status post excision, PSVT, GERD, chronic thrombocytopenia presented to the ED for evaluation of generalized weakness, sore throat, and fever for 2 days with generalized weakness..  Tested positive for COVID.  Patient also slipped out of his bed.  Of note patient had been non ambulatory recently and had been having difficulty completing his ADLs.  Patient was referred to the ED by his primary care physician.  In the ED, patient was febrile with a temperature of 102.6.  Fahrenheit and tachycardic and pulse ox was 80% on room air.  Was placed on 3 L of oxygen with improvement.  Laboratory data showed no leukocytosis but mild thrombocytopenia at 103K, SARS-CoV-2 PCR positive.  CT head showing findings of normal pressure hydrocephalus.  CT of cervical, thoracic, and lumbar spine negative for acute finding.  CT angiogram disorder no evidence of pulmonary embolism but right lower lobe infiltrate with effusion.  Patient received 500 mL of normal saline bolus and was admitted hospital for further evaluation and treatment.  Assessment and plan.  Severe sepsis secondary to COVID-19 pneumonia versus possible aspiration pneumonia with acute hypoxemic respiratory failure  CT angiogram after chest negative for PE but with right-sided infiltrate with small effusion.  Continue remdesivir and Decadron.  BNP within normal limits.  Procalcitonin elevated at 2.4.   Airborne precautions.  Continue incentive spirometry.   Lactic acid was 0.8.  No leukocytosis but temperature max of 102.6 F initially which has trended down to 98.6 F..  Patient has been started empirically on Unasyn.  Will continue for now.  Patient was  initially on 3 L of oxygen which has been weaned down to room air this morning. Blood cultures showing Streptococcus in 1 bottle.  Likely contaminant.  Patient is already on Unasyn.  Will continue to monitor.  Speech therapy has seen the patient and patient has been started on dysphagia 3 diet.  Hypokalemia.  Potassium of 4.0.  Improved.   Normal pressure hydrocephalus ED physician spoke with Dr. Wilford Corner neurology due to abnormal CT scan.  CT scan of the head is unchanged from previous and recommended outpatient neurosurgery follow-up.    Chronic thrombocytopenia No signs of bleeding.  Will continue to monitor.   Dementia -Delirium precautions   Anxiety/BPH Stable at this time.  History of PSVT: Continue telemetry monitor.  GERD Not on medications.    Generalized weakness/physical deconditioning/fall risk Continue fall precautions.  At baseline, was able to do things by himself at home.  PT OT has recommended skilled nursing facility placement at this time.    DVT prophylaxis: enoxaparin (LOVENOX) injection 40 mg Start: 05/06/22 2200   Code Status:     Code Status: Full Code  Disposition: Skilled nursing facility as per PT OT likely in 1 to 2 days.  Status is: Inpatient  Remains inpatient appropriate because:  IV antibiotic, right lower lobe pneumonia, possible aspiration, COVID Pneumonia, need for rehabitation.   Family Communication:    Spoke with the patient's son on the phone and updated him about the clinical condition of the patient on 05/07/2022.  Consultants:  None  Procedures:  None  Antimicrobials:  Remdesivir Unasyn  Anti-infectives (From admission, onward)    Start     Dose/Rate  Route Frequency Ordered Stop   05/07/22 1000  remdesivir 100 mg in sodium chloride 0.9 % 100 mL IVPB       See Hyperspace for full Linked Orders Report.   100 mg 200 mL/hr over 30 Minutes Intravenous Daily 05/06/22 2115 05/09/22 0959   05/06/22 2200  Ampicillin-Sulbactam  (UNASYN) 3 g in sodium chloride 0.9 % 100 mL IVPB        3 g 200 mL/hr over 30 Minutes Intravenous Every 6 hours 05/06/22 2120     05/06/22 2115  remdesivir 200 mg in sodium chloride 0.9% 250 mL IVPB       See Hyperspace for full Linked Orders Report.   200 mg 580 mL/hr over 30 Minutes Intravenous Once 05/06/22 2115 05/06/22 2325   05/06/22 0000  nirmatrelvir & ritonavir (PAXLOVID, 300/100,) 20 x 150 MG & 10 x 100MG  TBPK        3 tablet Oral 2 times daily 05/06/22 1827 05/11/22 2359      Subjective:  Today, patient was seen and examined at bedside.  Patient feels better with breathing but he still has some productive cough with yellowish phlegm production.  No chest pain dizziness nausea or vomiting.   Objective: Vitals:   05/07/22 0658 05/07/22 1101 05/07/22 1200 05/07/22 2033  BP: 120/61 123/61  (!) 113/56  Pulse: 68 75  84  Resp: 16 16  18   Temp: 97.7 F (36.5 C) 98.6 F (37 C)  98.6 F (37 C)  TempSrc: Oral Oral  Oral  SpO2: 98% 97% 100% 93%  Weight:      Height:        Intake/Output Summary (Last 24 hours) at 05/08/2022 0742 Last data filed at 05/08/2022 0300 Gross per 24 hour  Intake 654.82 ml  Output 450 ml  Net 204.82 ml    Filed Weights   05/06/22 1727  Weight: 55 kg    Physical Examination: Body mass index is 18.44 kg/m.   General: Alert awake and Communicative, thinly built, not in obvious distress, HENT:   No scleral pallor or icterus noted. Oral mucosa is moist.  Chest:   Diminished breath sounds bilaterally, coarse breath sounds noted. CVS: S1 &S2 heard. No murmur.  Regular rate and rhythm. Abdomen: Soft, nontender, nondistended.  Bowel sounds are heard.   Extremities: No cyanosis, clubbing or edema.  Peripheral pulses are palpable. Psych: Alert, awake and oriented, normal mood CNS:  No cranial nerve deficits.  Power equal in all extremities.   Skin: Warm and dry.  No rashes noted.  Data Reviewed:   CBC: Recent Labs  Lab 05/06/22 0955  05/07/22 0021 05/08/22 0508  WBC 8.8 9.1 10.6*  NEUTROABS 7.4  --   --   HGB 13.8 13.9 14.8  HCT 40.9 40.6 43.6  MCV 94.9 95.5 95.0  PLT 103* 99* 113*     Basic Metabolic Panel: Recent Labs  Lab 05/06/22 0955 05/07/22 0021 05/08/22 0508  NA 135 136 138  K 3.5 3.3* 4.0  CL 101 100 104  CO2 23 22 24   GLUCOSE 104* 97 145*  BUN 23 21 34*  CREATININE 0.74 0.77 0.64  CALCIUM 8.5* 8.0* 8.2*  MG  --   --  2.1     Liver Function Tests: Recent Labs  Lab 05/07/22 0021  AST 121*  ALT 35  ALKPHOS 44  BILITOT 1.1  PROT 5.4*  ALBUMIN 3.2*      Radiology Studies: CT Angio Chest PE W and/or Wo Contrast  Result Date: 05/06/2022 CLINICAL DATA:  Concern for pulmonary embolism. EXAM: CT ANGIOGRAPHY CHEST WITH CONTRAST TECHNIQUE: Multidetector CT imaging of the chest was performed using the standard protocol during bolus administration of intravenous contrast. Multiplanar CT image reconstructions and MIPs were obtained to evaluate the vascular anatomy. RADIATION DOSE REDUCTION: This exam was performed according to the departmental dose-optimization program which includes automated exposure control, adjustment of the mA and/or kV according to patient size and/or use of iterative reconstruction technique. CONTRAST:  57mL OMNIPAQUE IOHEXOL 350 MG/ML SOLN COMPARISON:  Chest radiograph dated 05/06/2022. FINDINGS: Evaluation of this exam is limited due to respiratory motion artifact. Evaluation is also limited due to streak artifact caused by patient's arms and overlying support wires. Cardiovascular: There is no cardiomegaly or pericardial effusion. Mild atherosclerotic calcification of the thoracic aorta. No aneurysmal dilatation or dissection. The origins of the great vessels of the aortic arch appear patent. Evaluation of the pulmonary arteries is limited due to respiratory motion. No pulmonary artery embolus identified. Mediastinum/Nodes: No hilar or mediastinal adenopathy. There is  infiltration of the fat surrounding the esophagus. Correlation with clinical exam recommended to exclude esophagitis. No mediastinal fluid collection. Lungs/Pleura: Mild bronchiectasis. Clusters of ground-glass nodular densities predominantly involving the right lower lobe as well as patchy area of airspace density at the right lung base most consistent with aspiration or pneumonia. Patchy area of interstitial coarsening involving the right upper lobe, likely chronic. There are biapical subpleural scarring. There is a small right pleural effusion. No pneumothorax. Mucus debris or aspirated content noted in the right mainstem bronchus extending into the bronchus intermedius and right lower lobe bronchi. There is impaction of a right lower lobe bronchus. Upper Abdomen: A 5 cm cyst in the left lobe of the liver. Musculoskeletal: No acute osseous pathology. Review of the MIP images confirms the above findings. IMPRESSION: 1. No CT evidence of pulmonary artery embolus. 2. Right lower lobe aspiration or pneumonia. 3. Small right pleural effusion. 4. Infiltration of the fat surrounding the esophagus. Correlation with clinical exam recommended to exclude esophagitis. 5.  Aortic Atherosclerosis (ICD10-I70.0). Electronically Signed   By: Anner Crete M.D.   On: 05/06/2022 20:26   CT Cervical Spine Wo Contrast  Result Date: 05/06/2022 CLINICAL DATA:  Neck trauma. EXAM: CT CERVICAL SPINE WITHOUT CONTRAST TECHNIQUE: Multidetector CT imaging of the cervical spine was performed without intravenous contrast. Multiplanar CT image reconstructions were also generated. RADIATION DOSE REDUCTION: This exam was performed according to the departmental dose-optimization program which includes automated exposure control, adjustment of the mA and/or kV according to patient size and/or use of iterative reconstruction technique. COMPARISON:  None Available. FINDINGS: Alignment: Normal. Skull base and vertebrae: No acute fracture. No  primary bone lesion or focal pathologic process. Soft tissues and spinal canal: No prevertebral fluid or swelling. No visible canal hematoma. Disc levels:  Maintained Upper chest: Biapical pleural/parenchymal changes most likely scarring. IMPRESSION: No acute traumatic abnormalities. Electronically Signed   By: Sammie Bench M.D.   On: 05/06/2022 12:46   DG Chest Portable 1 View  Result Date: 05/06/2022 CLINICAL DATA:  Cough, weakness, and fever.  COVID positive. EXAM: PORTABLE CHEST 1 VIEW COMPARISON:  Chest x-ray dated April 19, 2017. FINDINGS: The heart size and mediastinal contours are within normal limits. Normal pulmonary vascularity. Chronic biapical pleural-parenchymal scarring again noted. No focal consolidation, pleural effusion, or pneumothorax. No acute osseous abnormality. IMPRESSION: No active disease. Electronically Signed   By: Titus Dubin M.D.   On: 05/06/2022 10:13  CT Thoracic Spine Wo Contrast  Result Date: 05/06/2022 CLINICAL DATA:  [place] out of bed.  COVID positive. EXAM: CT THORACIC AND LUMBAR SPINE WITHOUT CONTRAST TECHNIQUE: Multidetector CT imaging of the thoracic and lumbar spine was performed without contrast. Multiplanar CT image reconstructions were also generated. RADIATION DOSE REDUCTION: This exam was performed according to the departmental dose-optimization program which includes automated exposure control, adjustment of the mA and/or kV according to patient size and/or use of iterative reconstruction technique. COMPARISON:  Lumbar spine x-rays dated April 14, 2022. MRI lumbar spine dated July 15, 2020. Chest x-ray dated April 19, 2017. FINDINGS: CT THORACIC SPINE FINDINGS Alignment: Normal. Vertebrae: No acute fracture or focal pathologic process. Osteopenia. Paraspinal and other soft tissues: Biapical pleuroparenchymal scarring. Right upper lobe bronchiectasis. Disc levels: Disc heights are relatively preserved. Minimal degenerative disc disease in the  midthoracic spine. CT LUMBAR SPINE FINDINGS Segmentation: 5 lumbar type vertebrae. Alignment: Mild dextroscoliosis.  No significant listhesis. Vertebrae: No acute fracture or focal pathologic process. Old healed fracture of the right L2 transverse process. Osteopenia. Paraspinal and other soft tissues: Aortoiliac atherosclerotic vascular disease. 5.1 cm simple cyst in the left liver. Few punctate calculi in the lower pole of the left kidney. Disc levels: Mild disc bulging at L4-L5 and L5-S1. Mild bilateral facet arthropathy from L3-L4 through L5-S1. IMPRESSION: 1. No acute osseous abnormality of the thoracic or lumbar spine. 2. Nonobstructive left nephrolithiasis. 3.  Aortic Atherosclerosis (ICD10-I70.0). Electronically Signed   By: Titus Dubin M.D.   On: 05/06/2022 10:08   CT Lumbar Spine Wo Contrast  Result Date: 05/06/2022 CLINICAL DATA:  [place] out of bed.  COVID positive. EXAM: CT THORACIC AND LUMBAR SPINE WITHOUT CONTRAST TECHNIQUE: Multidetector CT imaging of the thoracic and lumbar spine was performed without contrast. Multiplanar CT image reconstructions were also generated. RADIATION DOSE REDUCTION: This exam was performed according to the departmental dose-optimization program which includes automated exposure control, adjustment of the mA and/or kV according to patient size and/or use of iterative reconstruction technique. COMPARISON:  Lumbar spine x-rays dated April 14, 2022. MRI lumbar spine dated July 15, 2020. Chest x-ray dated April 19, 2017. FINDINGS: CT THORACIC SPINE FINDINGS Alignment: Normal. Vertebrae: No acute fracture or focal pathologic process. Osteopenia. Paraspinal and other soft tissues: Biapical pleuroparenchymal scarring. Right upper lobe bronchiectasis. Disc levels: Disc heights are relatively preserved. Minimal degenerative disc disease in the midthoracic spine. CT LUMBAR SPINE FINDINGS Segmentation: 5 lumbar type vertebrae. Alignment: Mild dextroscoliosis.  No significant  listhesis. Vertebrae: No acute fracture or focal pathologic process. Old healed fracture of the right L2 transverse process. Osteopenia. Paraspinal and other soft tissues: Aortoiliac atherosclerotic vascular disease. 5.1 cm simple cyst in the left liver. Few punctate calculi in the lower pole of the left kidney. Disc levels: Mild disc bulging at L4-L5 and L5-S1. Mild bilateral facet arthropathy from L3-L4 through L5-S1. IMPRESSION: 1. No acute osseous abnormality of the thoracic or lumbar spine. 2. Nonobstructive left nephrolithiasis. 3.  Aortic Atherosclerosis (ICD10-I70.0). Electronically Signed   By: Titus Dubin M.D.   On: 05/06/2022 10:08   CT Head Wo Contrast  Result Date: 05/06/2022 CLINICAL DATA:  Head trauma. EXAM: CT HEAD WITHOUT CONTRAST TECHNIQUE: Contiguous axial images were obtained from the base of the skull through the vertex without intravenous contrast. RADIATION DOSE REDUCTION: This exam was performed according to the departmental dose-optimization program which includes automated exposure control, adjustment of the mA and/or kV according to patient size and/or use of iterative reconstruction technique. COMPARISON:  None Available. FINDINGS: Brain: There is periventricular white matter decreased attenuation consistent with small vessel ischemic changes. Ventricles, sulci and cisterns are prominent consistent with age related involutional changes. No acute intracranial hemorrhage, mass effect or shift. There is ventricular prominence is out of proportion to the extent of involutional changes suggesting normal pressure hydrocephalus. Vascular: No hyperdense vessel or unexpected calcification. Skull: Normal. Negative for fracture or focal lesion. Sinuses/Orbits: There is a splenoid cyst or polyp measuring 1.1 cm. Mucoperiosteal thickening consistent with chronic maxillary and ethmoid sinusitis. IMPRESSION: 1. Atrophy and chronic small vessel ischemic changes. 2. Findings suggest normal pressure  hydrocephalus. 3. Sequelae of chronic sinusitis. Electronically Signed   By: Layla Maw M.D.   On: 05/06/2022 09:56      LOS: 2 days    Joycelyn Das, MD [facility] Available via Epic secure chat 7am-7pm After these hours, please refer to coverage provider listed on [email or web address] 05/08/2022, 7:42 AM

## 2022-05-08 NOTE — Progress Notes (Signed)
Patient tried to get out of chair alone and fell on floor. Scrapes to right hand with controlled bleeding to hemostasis, covered in mepilex. Patient back to bed x1 assist. Denies pain. VSS as charted. Dr Louanne Belton and wife notified of same.

## 2022-05-08 NOTE — TOC Progression Note (Signed)
Transition of Care [facility]) - Progression Note    Patient Details  Name: Lee Nelson MRN: [id number] Date of Birth: [birth year]  Transition of Care [facility] CM/SW Contact  Kirsty Monjaraz, Juliann Pulse, RN Phone Number: 05/08/2022, 2:24 PM  Clinical Narrative:   Await choice-spoke to Matthew(Son)-depending on facility chosen may need isolation 5 or 10day-will confirm once choice made.    Expected Discharge Plan: [place] Barriers to Discharge: Continued Medical Work up  Expected Discharge Plan and Services   Discharge Planning Services: CM Consult   Living arrangements for the past 2 months: Single Family Home                                       Social Determinants of Health (SDOH) Interventions SDOH Screenings   Food Insecurity: No Food Insecurity (05/06/2022)  Housing: Low Risk  (05/06/2022)  Transportation Needs: No Transportation Needs (05/06/2022)  Utilities: Not At Risk (05/06/2022)  Tobacco Use: Low Risk  (05/06/2022)    Readmission Risk Interventions     No data to display

## 2022-05-08 NOTE — Progress Notes (Addendum)
Modified Barium Swallow Study  Patient Details  Name: Lee Nelson MRN: [id number] Date of Birth: 1941/02/26  Today's Date: 05/08/2022  Modified Barium Swallow completed.  Full report located under Chart Review in the Imaging Section.  History of Present Illness 82 yo male adm to [facility] - with AMS, He slipped OOB, tested + for COVID, bladder ca- pt denies states its urinary retention, dementia, GERD s/p [employer]- faint reflux on DG esophagram 2012, vocal fold atrophy- takes lots of liquids to swallow pill, skin cancer in situ hx, 2 days weakness, cxr concerning for aspiration- Mucus debris or aspirated content noted in the right mainstem bronchus extending into the bronchus intermedius and right lower lobe bronchi. There is impaction of a right lower lobe bronchus, Infiltration of the fat surrounding the esophagus. ? esophagitis. FEES 2016 showed retention that was improved with chin tuck posture.   MBS done at [facility] 11/2021 showed laryngeal penetration of thin intermittently, incomplete airway closure, residual worse with increased viscocity,  Also question impaired distention of UES, with decreased oral propulsion.  Pt reports weight loss due to ABX - 20 pounds - and unable to put back on the weight.  Pt also reports problems with tooth and nasal drainage.  Pt has muscle tension dysphonia per notes from [facility] - likely contributing to his dysphagia.  Swallow eval ordered.   Clinical Impression Pt presents with oropharyngeal dysphagia characterized by reduction in bolus cohesion, tongue base retraction, laryngeal elevation, anterior laryngeal movement, and epiglottic inversion. The swallow was often triggered with the head of the bolus at the level valleculae or the pyriform sinuses. Pt demonstrated vallecular residue, posterior pharyngeal wall residue, pyriform sinus residue, and minimal PES distention. Pharyngeal residue was improved, but not eliminated with a liquid wash and pt's independent use of  secondary swallows also reduced residue. Penetration (PAS 5) and aspiration (PAS 7, 8) were noted with thin liquids and with nectar thick liquids. Aspiration occurred before, during, and after secondary to the pharyngeal delay, reduced laryngeal vestibule closure, and pharyngeal residue, respectively. Coughing was noted with larger amounts of aspiration, but smaller quantities did not trigger coughing; pt's cough was ineffective in expelling aspirated material. Both an effortful swallow and chin tuck eliminated aspiration of nectar thick liquids and reduced quantity of aspiration or delayed aspiration with thin liquids.  Pt's overall swallow function today appears worse than that noted with his modified barium swallow study in September 2023. SLP suspects acute on chronic dysphagia and he is currently at risk for dysphagia-related adverse events in the setting of GERD, and acute illness. Palliative consult may be useful to help establish GOC; Dr. Louanne Belton advised of pt's performance and recommendations. A dysphagia 2 diet with nectar thick liquids is recommended at this time with strict observance of swallowing precautions and SLP will continue to follow pt.  Factors that may increase risk of adverse event in presence of aspiration Phineas Douglas & Padilla 2021): Respiratory or GI disease  Swallow Evaluation Recommendations Recommendations: PO diet PO Diet Recommendation: Dysphagia 2 (Finely chopped);Mildly thick liquids (Level 2, nectar thick) Liquid Administration via: Cup;No straw Medication Administration: Crushed with puree Supervision: Staff to assist with self-feeding;Full supervision/cueing for swallowing strategies Swallowing strategies  : Slow rate;Small bites/sips;Follow solids with liquids;Multiple dry swallows after each bite/sip (Effortful swallow) Postural changes: Position pt fully upright for meals;Stay upright 30-60 min after meals Oral care recommendations: Oral care BID (2x/day) Recommended  consults: Consider Palliative care   Sita Mangen I. Hardin Negus, [place], [place] Office number  [place] 05/08/2022,2:26 PM

## 2022-05-08 NOTE — Progress Notes (Signed)
Mobility Specialist - Progress Note   05/08/22 1531  Mobility  Activity Transferred from bed to chair  Level of Assistance Maximum assist, patient does 25-49%  Assistive Device Front wheel walker  Distance Ambulated (ft) 5 ft  Activity Response Tolerated fair  Mobility Referral Yes  $Mobility charge 1 Mobility   Pt was found in bed soiled and stated not having good sleep last night. Also stated not having ate much. Pt was agreeable to transfer to recliner chair due to having poor energy today. Pt was max-A from lying to sitting and mod-A from sitting to standing. Once up was contact assist due to being unsteady side stepping to recliner chair. Pt was left on recliner chair with all necessities in reach and chair alarm on. RN notified of session.  Ferd Hibbs Mobility Specialist

## 2022-05-09 DIAGNOSIS — G912 (Idiopathic) normal pressure hydrocephalus: Secondary | ICD-10-CM | POA: Diagnosis not present

## 2022-05-09 DIAGNOSIS — J9601 Acute respiratory failure with hypoxia: Secondary | ICD-10-CM | POA: Diagnosis not present

## 2022-05-09 DIAGNOSIS — D696 Thrombocytopenia, unspecified: Secondary | ICD-10-CM | POA: Diagnosis not present

## 2022-05-09 DIAGNOSIS — U071 COVID-19: Secondary | ICD-10-CM | POA: Diagnosis not present

## 2022-05-09 DIAGNOSIS — J1282 Pneumonia due to coronavirus disease 2019: Secondary | ICD-10-CM | POA: Diagnosis not present

## 2022-05-09 LAB — BASIC METABOLIC PANEL
Anion gap: 9 (ref 5–15)
BUN: 27 mg/dL — ABNORMAL HIGH (ref 8–23)
CO2: 25 mmol/L (ref 22–32)
Calcium: 8.3 mg/dL — ABNORMAL LOW (ref 8.9–10.3)
Chloride: 106 mmol/L (ref 98–111)
Creatinine, Ser: 0.6 mg/dL — ABNORMAL LOW (ref 0.61–1.24)
GFR, Estimated: >60 mL/min (ref >60)
Glucose, Bld: 160 mg/dL — ABNORMAL HIGH (ref 70–99)
Potassium: 4.1 mmol/L (ref 3.5–5.1)
Sodium: 140 mmol/L (ref 135–145)

## 2022-05-09 LAB — CBC
HCT: 41.2 % (ref 39.0–52.0)
Hemoglobin: 13.9 g/dL (ref 13.0–17.0)
MCH: 32.4 pg (ref 26.0–34.0)
MCHC: 33.7 g/dL (ref 30.0–36.0)
MCV: 96 fL (ref 80.0–100.0)
Platelets: 104 10*3/uL — ABNORMAL LOW (ref 150–400)
RBC: 4.29 MIL/uL (ref 4.22–5.81)
RDW: 13.3 % (ref 11.5–15.5)
WBC: 8.1 10*3/uL (ref 4.0–10.5)
nRBC: 0 % (ref 0.0–0.2)

## 2022-05-09 LAB — CULTURE, BLOOD (ROUTINE X 2)

## 2022-05-09 LAB — MAGNESIUM: Magnesium: 2.1 mg/dL (ref 1.7–2.4)

## 2022-05-09 NOTE — Progress Notes (Signed)
CCMD called about the pt had tachy, HR above 140s then back to 72. Seems have elevated P wave per CCMD stated. RN walk to check, the pt is in sleep. On-call notified.

## 2022-05-09 NOTE — Progress Notes (Signed)
PROGRESS NOTE    Lee Nelson  [id number] DOB: 1941-01-16 DOA: 05/06/2022 PCP: Prince Solian, MD    Brief Narrative:  Lee Nelson is a 82 y.o. male with medical history significant of dementia, anxiety, arthritis, osteoporosis, BPH, nephrolithiasis, PSVT, GERD, chronic thrombocytopenia presented to the ED for evaluation of generalized weakness, sore throat, and fever for 2 days with generalized weakness.  Patient also slipped out of his bed.  Of note, patient had been non ambulatory recently and had been having difficulty completing his ADLs.  Patient was referred to the ED by his primary care physician.  In the ED, patient was febrile with a temperature of 102.6.  Fahrenheit and tachycardic , pulse ox was 80% on room air.  Was placed on 3 L of oxygen with improvement.  Laboratory data showed no leukocytosis but mild thrombocytopenia at 103K, SARS-CoV-2 PCR positive.  CT head showing findings of normal pressure hydrocephalus.  CT of cervical, thoracic, and lumbar spine negative for acute finding.  CT angiogram without  evidence of pulmonary embolism but right lower lobe infiltrate with effusion.  Patient received 500 mL of normal saline bolus and was admitted hospital for further evaluation and treatment.  Assessment and plan.  Severe sepsis secondary to COVID-19 pneumonia versus possible aspiration pneumonia with acute hypoxemic respiratory failure  CT angiogram after chest negative for PE but with right-sided infiltrate with small effusion.  Patient has completed course of remdesivir.  Continue Decadron.  BNP within normal limits.  Initial procalcitonin elevated at 2.4.   Airborne precautions.  Continue incentive spirometry.   Lactic acid was 0.8.  No leukocytosis and temperature has improved to 98.8 F.   Patient is currently on room air and was initially on 3 L of oxygen.  Blood cultures showing Streptococcus in 1 bottle.  Likely contaminant.  Patient is already on Unasyn. Speech  therapy has seen the patient and patient is on dysphagia 2 diet.  Palliative care has been consulted for goals of care  Hypokalemia.  Potassium of 4.  Improved after replacement   Normal pressure hydrocephalus ED physician spoke with Dr. Rory Percy neurology due to abnormal CT scan.  CT scan of the head is unchanged from previous and recommended outpatient neurosurgery follow-up.    Chronic thrombocytopenia No signs of bleeding.  Will continue to monitor.  Dementia  -Delirium precautions   Anxiety/BPH Stable at this time.  History of PSVT: Continue telemetry monitor.  GERD Not on medications.   Generalized weakness/physical deconditioning/fall risk Continue fall precautions.  At baseline, was able to do things by himself at home.  PT OT has recommended skilled nursing facility placement at this time.    DVT prophylaxis: enoxaparin (LOVENOX) injection 40 mg Start: 05/06/22 2200   Code Status:     Code Status: Full Code  Disposition: Skilled nursing facility  likely in 1 to 2 days.  Status is: Inpatient  Remains inpatient appropriate because:  IV antibiotic, right lower lobe pneumonia, possible aspiration, COVID Pneumonia, need for rehabitation.   Family Communication:   Spoke with the patient's son on the phone and updated him about the clinical condition of the patient on 05/07/2022.  Consultants:  Medication  Procedures:  None  Antimicrobials:  Remdesivir Unasyn  Anti-infectives (From admission, onward)    Start     Dose/Rate Route Frequency Ordered Stop   05/07/22 1000  remdesivir 100 mg in sodium chloride 0.9 % 100 mL IVPB       See Hyperspace for full Linked  Orders Report.   100 mg 200 mL/hr over 30 Minutes Intravenous Daily 05/06/22 2115 05/08/22 1230   05/06/22 2200  Ampicillin-Sulbactam (UNASYN) 3 g in sodium chloride 0.9 % 100 mL IVPB        3 g 200 mL/hr over 30 Minutes Intravenous Every 6 hours 05/06/22 2120     05/06/22 2115  remdesivir 200 mg in sodium  chloride 0.9% 250 mL IVPB       See Hyperspace for full Linked Orders Report.   200 mg 580 mL/hr over 30 Minutes Intravenous Once 05/06/22 2115 05/06/22 2325   05/06/22 0000  nirmatrelvir & ritonavir (PAXLOVID, 300/100,) 20 x 150 MG & 10 x 100MG TBPK        3 tablet Oral 2 times daily 05/06/22 1827 05/11/22 2359      Subjective:  Today, patient was seen and examined at bedside.  Patient denies any pain, nausea, vomiting, fever or chills.  Has some cough with sputum production.  No fever or chills.  Has been off oxygen.  Complains of generalized weakness  Objective: Vitals:   05/08/22 1648 05/08/22 2127 05/09/22 0413 05/09/22 0756  BP: (!) 166/81 (!) 156/78 135/76 134/82  Pulse: (!) 108 (!) 104 74 67  Resp: 20 16 20 18  $ Temp: 98.8 F (37.1 C) 98.7 F (37.1 C) 98.1 F (36.7 C)   TempSrc: Oral Oral Oral   SpO2: 90% 91% 96% 96%  Weight:      Height:        Intake/Output Summary (Last 24 hours) at 05/09/2022 1152 Last data filed at 05/09/2022 0700 Gross per 24 hour  Intake 460 ml  Output 1000 ml  Net -540 ml    Filed Weights   05/06/22 1727  Weight: 55 kg    Physical Examination: Body mass index is 18.44 kg/m.   General: Alert awake and Communicative, thinly built, not in obvious distress, elderly male, appears chronically ill HENT:   No scleral pallor or icterus noted. Oral mucosa is moist.  Chest:   Diminished breath sounds bilaterally.  Coarse breath sounds noted. CVS: S1 &S2 heard. No murmur.  Regular rate and rhythm. Abdomen: Soft, nontender, nondistended.  Bowel sounds are heard.   Extremities: No cyanosis, clubbing or edema.  Peripheral pulses are palpable. Psych: Alert, awake and oriented, normal mood CNS:  No cranial nerve deficits.  Generalized weakness noted. Skin: Warm and dry.  No rashes noted.  Data Reviewed:   CBC: Recent Labs  Lab 05/06/22 0955 05/07/22 0021 05/08/22 0508 05/09/22 0351  WBC 8.8 9.1 10.6* 8.1  NEUTROABS 7.4  --   --   --   HGB  13.8 13.9 14.8 13.9  HCT 40.9 40.6 43.6 41.2  MCV 94.9 95.5 95.0 96.0  PLT 103* 99* 113* 104*     Basic Metabolic Panel: Recent Labs  Lab 05/06/22 0955 05/07/22 0021 05/08/22 0508 05/09/22 0351  NA 135 136 138 140  K 3.5 3.3* 4.0 4.1  CL 101 100 104 106  CO2 23 22 24 25  $ GLUCOSE 104* 97 145* 160*  BUN 23 21 34* 27*  CREATININE 0.74 0.77 0.64 0.60*  CALCIUM 8.5* 8.0* 8.2* 8.3*  MG  --   --  2.1 2.1     Liver Function Tests: Recent Labs  Lab 05/07/22 0021  AST 121*  ALT 35  ALKPHOS 44  BILITOT 1.1  PROT 5.4*  ALBUMIN 3.2*      Radiology Studies: DG Swallowing Func-Speech Pathology  Result Date: 05/08/2022  Table formatting from the original result was not included. Modified Barium Swallow Study Patient Details Name: ELIOR ALOI MRN: [id number] Date of Birth: Mar 13, 1941 Today's Date: 05/08/2022 HPI/PMH: HPI: 82 yo male adm to [facility] - with AMS, He slipped OOB, tested + for COVID, bladder ca- pt denies states its urinary retention, dementia, GERD s/p [employer]- faint reflux on DG esophagram 2012, vocal fold atrophy- takes lots of liquids to swallow pill, skin cancer in situ hx, 2 days weakness, cxr concerning for aspiration- Mucus debris or aspirated content noted in the right mainstem bronchus extending into the bronchus intermedius and right lower lobe bronchi. There is impaction of a right lower lobe bronchus, Infiltration of the fat surrounding the esophagus. ? esophagitis. FEES 2016 showed retention that was improved with chin tuck posture.   MBS done at [facility] 11/2021 showed laryngeal penetration of thin intermittently, incomplete airway closure, residual worse with increased viscocity,  Also question impaired distention of UES, with decreased oral propulsion.  Pt reports weight loss due to ABX - 20 pounds - and unable to put back on the weight.  Pt also reports problems with tooth and nasal drainage.  Pt has muscle tension dysphonia per notes from [facility] - likely contributing  to his dysphagia.  Swallow eval ordered. Clinical Impression: Clinical Impression: Pt presents with oropharyngeal dysphagia characterized by reduction in bolus cohesion, tongue base retraction, laryngeal elevation, anterior laryngeal movement, and epiglottic inversion. The swallow was often triggered with the head of the bolus at the level valleculae or the pyriform sinuses. Pt demonstrated vallecular residue, posterior pharyngeal wall residue, pyriform sinus residue, and minimal PES distention. Pharyngeal residue was improved, but not eliminated with a liquid wash and pt's independent use of secondary swallows also reduced residue. Penetration (PAS 5) and aspiration (PAS 7, 8) were noted with thin liquids and with nectar thick liquids. Aspiration occurred before, during, and after secondary to the pharyngeal delay, reduced laryngeal vestibule closure, and pharyngeal residue, respectively. Coughing was noted with larger amounts of aspiration, but smaller quantities did not trigger coughing; pt's cough was ineffective in expelling aspirated material. Both an effortful swallow and chin tuck eliminated aspiration of nectar thick liquids and reduced quantity of aspiration or delayed aspiration with thin liquids.  Pt's overall swallow function today appears worse than that noted with his modified barium swallow study in September 2023. SLP suspects acute on chronic dysphagia and he is currently at risk for dysphagia-related adverse events in the setting of GERD, and acute illness. Palliative consult may be useful to help establish GOC; Dr. Louanne Belton advised of pt's performance and recommendations. A dysphagia 2 diet with nectar thick liquids is recommended at this time with strict observance of swallowing precautions and SLP will continue to follow pt. Factors that may increase risk of adverse event in presence of aspiration Phineas Douglas & Padilla 2021): Respiratory or GI disease Recommendations/Plan: Swallowing Evaluation  Recommendations Swallowing Evaluation Recommendations Recommendations: PO diet PO Diet Recommendation: Dysphagia 2 (Finely chopped); Mildly thick liquids (Level 2, nectar thick) Liquid Administration via: Cup; No straw Medication Administration: Crushed with puree Supervision: Staff to assist with self-feeding; Full supervision/cueing for swallowing strategies Swallowing strategies  : Slow rate; Small bites/sips; Follow solids with liquids; Multiple dry swallows after each bite/sip (Effortful swallow) Postural changes: Position pt fully upright for meals; Stay upright 30-60 min after meals Oral care recommendations: Oral care BID (2x/day) Recommended consults: Consider Palliative care Treatment Plan Treatment Plan Treatment recommendations: Therapy as outlined in treatment plan below Follow-up recommendations: Skilled  nursing-short term rehab (<3 hours/day) Functional status assessment: Patient has had a recent decline in their functional status and/or demonstrates limited ability to make significant improvements in function in a reasonable and predictable amount of time. Treatment frequency: Min 2x/week Treatment duration: 2 weeks Interventions: Diet toleration management by SLP; Trials of upgraded texture/liquids Recommendations Recommendations for follow up therapy are one component of a multi-disciplinary discharge planning process, led by the attending physician.  Recommendations may be updated based on patient status, additional functional criteria and insurance authorization. Assessment: Orofacial Exam: Orofacial Exam Oral Cavity - Dentition: Adequate natural dentition Anatomy: Anatomy: WFL Thin Liquids: Thin Liquids (Level 0) Thin Liquids : Impaired Bolus delivery method: Cup Thin Liquid - Impairment: Oral Impairment; Pharyngeal impairment Lip Closure: No labial escape Tongue control during bolus hold: Posterior escape of less than half of bolus Bolus transport/lingual motion: Brisk tongue motion Oral  residue: Trace residue lining oral structures Location of oral residue : Tongue Initiation of swallow : Pyriform sinuses Soft palate elevation: Partial Laryngeal elevation: Partial or minimal superior movement and approximation Anterior hyoid excursion: Partial Epiglottic movement: Partial Laryngeal vestibule closure: Incomplete Pharyngeal stripping wave : Present - diminished Pharyngeal contraction (A/P view only): N/A Pharyngoesophageal segment opening: Minimal distention/minimal duration, marked obstruction of flow Tongue base retraction: Wide column of contrast or air between tongue base and PPW Pharyngeal residue: Collection of residue within or on pharyngeal structures Location of pharyngeal residue: Valleculae; Pharyngeal wall; Pyriform sinuses Penetration/Aspiration Scale (PAS) score: 7.  Material enters airway, passes BELOW cords and not ejected out despite cough attempt by patient; 8.  Material enters airway, passes BELOW cords without attempt by patient to eject out (silent aspiration)  Mildly Thick Liquids: Mildly thick liquids (Level 2, nectar thick) Mildly thick liquids (Level 2, nectar thick): Impaired Bolus delivery method: Cup Mildly Thick Liquid - Impairment: Oral Impairment; Pharyngeal impairment Lip Closure: No labial escape Tongue control during bolus hold: Cohesive bolus between tongue to palatal seal Bolus transport/lingual motion: Brisk tongue motion Oral residue: Trace residue lining oral structures Location of oral residue : Tongue Initiation of swallow : Valleculae Soft palate elevation: Partial Laryngeal elevation: Partial or minimal superior movement and approximation Anterior hyoid excursion: Partial Epiglottic movement: Partial Laryngeal vestibule closure: Incomplete Pharyngeal stripping wave : Present - diminished Pharyngeal contraction (A/P view only): N/A Pharyngoesophageal segment opening: Minimal distention/minimal duration, marked obstruction of flow Tongue base retraction: Wide  column of contrast or air between tongue base and PPW Pharyngeal residue: Collection of residue within or on pharyngeal structures Location of pharyngeal residue: Valleculae; Pharyngeal wall; Pyriform sinuses Penetration/Aspiration Scale (PAS) score: 7.  Material enters airway, passes BELOW cords and not ejected out despite cough attempt by patient  Moderately Thick Liquids: Moderately thick liquids (Level 3, honey thick) Moderately thick liquids (Level 3, honey thick): Impaired Bolus delivery method: Spoon; Cup Moderately Thick Liquid - Impairment: Pharyngeal impairment Initiation of swallow : Valleculae Soft palate elevation: Complete Laryngeal elevation: Partial or minimal superior movement and approximation Anterior hyoid excursion: Partial Epiglottic movement: Partial Laryngeal vestibule closure: Incomplete Pharyngeal stripping wave : Present - diminished Pharyngeal contraction (A/P view only): N/A Pharyngoesophageal segment opening: Minimal distention/minimal duration, marked obstruction of flow Tongue base retraction: Wide column of contrast or air between tongue base and PPW Pharyngeal residue: Collection of residue within or on pharyngeal structures Location of pharyngeal residue: Valleculae; Pharyngeal wall; Pyriform sinuses Penetration/Aspiration Scale (PAS) score: 1.  Material does not enter airway  Puree: Puree Puree: Impaired Puree - Impairment: Pharyngeal impairment;  Oral Impairment Lip Closure: No labial escape Bolus transport/lingual motion: Slow tongue motion Oral residue: Residue collection on oral structures Location of oral residue : Tongue Initiation of swallow: Valleculae Soft palate elevation: Complete Laryngeal elevation: Partial or minimal superior movement and approximation Anterior hyoid excursion: Partial Epiglottic movement: Partial (but better than with less viscous consistencies.) Laryngeal vestibule closure: Complete: No air/contrast in laryngeal vestibule Pharyngeal stripping wave :  Present - diminished Pharyngeal contraction (A/P view only): N/A Pharyngoesophageal segment opening: Minimal distention/minimal duration, marked obstruction of flow Tongue base retraction: Wide column of contrast or air between tongue base and PPW Pharyngeal residue: Collection of residue within or on pharyngeal structures Location of pharyngeal residue: Valleculae; Pharyngeal wall; Pyriform sinuses Penetration/Aspiration Scale (PAS) score: 1.  Material does not enter airway Solid: Solid Solid: Impaired Solid - Impairment: Pharyngeal impairment Initiation of swallow: Posterior angle of the ramus Soft palate elevation: Complete Laryngeal elevation: Partial or minimal superior movement and approximation Anterior hyoid excursion: Partial Epiglottic movement: Partial Laryngeal vestibule closure: Incomplete Pharyngeal stripping wave : Present - diminished Pharyngeal contraction (A/P view only): N/A Pharyngoesophageal segment opening: Minimal distention/minimal duration, marked obstruction of flow Tongue base retraction: Wide column of contrast or air between tongue base and PPW Pharyngeal residue: Collection of residue within or on pharyngeal structures Location of pharyngeal residue: Valleculae; Pharyngeal wall; Pyriform sinuses Penetration/Aspiration Scale (PAS) score: 1.  Material does not enter airway Pill: Pill Pill: Not Tested Compensatory Strategies: Compensatory Strategies Compensatory strategies: Yes Straw: Effective; Ineffective Effortful swallow: Effective; Ineffective Effective Effortful Swallow: Mildly thick liquid (Level 2, nectar thick) Ineffective Effortful Swallow: Thin liquid (Level 0) Multiple swallows: Effective Effective Multiple Swallows: Puree; Thin liquid (Level 0); Mildly thick liquid (Level 2, nectar thick); Moderately thick liquid (Level 3, honey thick); Solid (reduced, but did not eliminate residue) Chin tuck: Ineffective; Effective Effective Chin Tuck: Mildly thick liquid (Level 2, nectar  thick) Ineffective Chin Tuck: Thin liquid (Level 0); Mildly thick liquid (Level 2, nectar thick) Liquid wash: Effective Effective Liquid Wash: Puree; Solid   General Information: Caregiver present: No  Diet Prior to this Study: Dysphagia 3 (mechanical soft); Thin liquids (Level 0)   Temperature : Normal   Respiratory Status: WFL   Supplemental O2: None (Room air)   History of Recent Intubation: No  Behavior/Cognition: Alert; Cooperative Self-Feeding Abilities: Able to self-feed No data recorded Volitional Cough: -- (N/A) Volitional Swallow: Able to elicit Exam Limitations: Poor positioning Goal Planning: Prognosis for improved oropharyngeal function: Fair Barriers to Reach Goals: Time post onset; Severity of deficits No data recorded Patient/Family Stated Goal: none stated Consulted and agree with results and recommendations: Patient Pain: Pain Assessment Pain Assessment: Faces Faces Pain Scale: 0 Pain Location: legs Pain Descriptors / Indicators: Discomfort; Constant Pain Intervention(s): Monitored during session End of Session: Start Time:SLP Start Time (ACUTE ONLY): 1230 Stop Time: SLP Stop Time (ACUTE ONLY): 1301 Time Calculation:SLP Time Calculation (min) (ACUTE ONLY): 31 min Charges: SLP Evaluations $ SLP Speech Visit: 1 Visit SLP Evaluations $BSS Swallow: 1 Procedure $MBS Swallow: 1 Procedure $Swallowing Treatment: 1 Procedure SLP visit diagnosis: SLP Visit Diagnosis: Dysphagia, oropharyngeal phase (R13.12); Dysphagia, pharyngoesophageal phase (R13.14) Past Medical History: Past Medical History: Diagnosis Date  Anxiety   Arthritis   Bladder calculus   BPH (benign prostatic hyperplasia)   Chronic constipation   Complication of anesthesia   " I had some coughing afterwards for a couple of days"--  per pt "perfers spinal anesthesia since general anesthesia congnitive issues when older"  Diverticulosis of  colon   Dry eye syndrome of both eyes   Environmental allergies   GERD (gastroesophageal reflux disease)    occasional  History of adenomatous polyp of colon   08/ 2004  History of kidney stones   History of squamous cell carcinoma in situ (SCCIS) of skin   s/p  excision 2013 facial areas and 06/ 2016 nose  Migraine   eye migraine occasional  Seasonal and perennial allergic rhinitis   Thrombocytopenia (HCC)   Tingling   hands and feet bilat , intermittantly-- per pt has lumbar bulging disk  Urinary frequency   Vocal fold atrophy   dysphonia-- per pt has to drink large amount of water to take even on pill  Wears glasses  Past Surgical History: Past Surgical History: Procedure Laterality Date  APPENDECTOMY  child  CARDIOVASCULAR STRESS TEST  05-17-2015  dr hilty  Low risk nuclear study w/ no ischemia/  normal LV function and wall motion , stress ef 54% (lvef 45-54%)  CHOLECYSTECTOMY N/A 09/29/2014  Procedure: LAPAROSCOPIC CHOLECYSTECTOMY WITH INTRAOPERATIVE CHOLANGIOGRAM;  Surgeon: Alphonsa Overall, MD;  Location: [facility];  Service: General;  Laterality: N/A;  COLONOSCOPY  last one 09-06-2010  ESOPHAGOGASTRODUODENOSCOPY N/A 12/09/2013  Procedure: ESOPHAGOGASTRODUODENOSCOPY (EGD);  Surgeon: Arta Silence, MD;  [facility] ENDOSCOPY;  Service: Endoscopy;  Laterality: N/A;  EXTRACORPOREAL SHOCK WAVE LITHOTRIPSY  yrs ago  INGUINAL HERNIA REPAIR Left child  inguinal hernia repair  [id number]  NISSEN FUNDOPLICATION  [id number]  open  STONE EXTRACTION WITH BASKET N/A 08/18/2016  Procedure: STONE EXTRACTION WITH BASKET;  Surgeon: Carolan Clines, MD;  Location: [facility];  Service: Urology;  Laterality: N/A;  THULIUM LASER TURP (TRANSURETHRAL RESECTION OF PROSTATE) N/A 08/18/2016  Procedure: THULIUM LASER BLADDER NECK INCISION AND BLADDER STONE REMOVAL;  Surgeon: Carolan Clines, MD;  Location: [facility];  Service: Urology;  Laterality: N/A;  TONSILLECTOMY  child  TRANSTHORACIC ECHOCARDIOGRAM  [id number]  grade 1 diastolic dysfunction, ef [id number]  trivial MR and TR/ mild dilated RA Shanika I. Hardin Negus,  [place], [place] Office number [phone] Horton Marshall 05/08/2022, 2:28 PM     LOS: 3 days    Flora Lipps, MD [facility] Available via Epic secure chat 7am-7pm After these hours, please refer to coverage provider listed on [email or web address] 05/09/2022, 11:52 AM

## 2022-05-09 NOTE — TOC Progression Note (Addendum)
Transition of Care [facility]) - Progression Note    Patient Details  Name: ISADOR FACEMIRE MRN: [id number] Date of Birth: Aug 27, 1940  [facility] CM/SW Contact  Hobert Poplaski, Juliann Pulse, RN Phone Number: 05/09/2022, 1:47 PM  Clinical Narrative: TC son Rodman Key he deferred to contact dtr in law Anderson Malta [phone] for [facility] rep to call her-I have sent message to [facility] rep Juliann Pulse w/her tel#.Awaiting choice for ST SNF.   -2:57p chose [facility]-10 day isolation-2/6-2/16 can admit on 2/17 to [facility].    Expected Discharge Plan: [place] Barriers to Discharge: Continued Medical Work up  Expected Discharge Plan and Services   Discharge Planning Services: CM Consult   Living arrangements for the past 2 months: Single Family Home                                       Social Determinants of Health (SDOH) Interventions SDOH Screenings   Food Insecurity: No Food Insecurity (05/06/2022)  Housing: Low Risk  (05/06/2022)  Transportation Needs: No Transportation Needs (05/06/2022)  Utilities: Not At Risk (05/06/2022)  Tobacco Use: Low Risk  (05/06/2022)    Readmission Risk Interventions     No data to display

## 2022-05-09 NOTE — Progress Notes (Signed)
Speech Language Pathology Treatment: Dysphagia  Patient Details Name: Lee Nelson MRN: [id number] DOB: 02-25-1941 Today's Date: 05/09/2022 Time: [id number] SLP Time Calculation (min) (ACUTE ONLY): 22 min  Assessment / Plan / Recommendation Clinical Impression  Pt seen to review MBS including dietary recommendations and swallow precautions with pt.  SLP reviewed MBS flouro loops using teach back re: dysphagia.  He is c/o "dry mouth"- Desires to swish and expectorate with water - which SLP approves given his good oral control.    Observed pt consuming nectar thick Lemon Lime soda- via cup - multiple swallows with each bolus noted - and subtle coughing.   Reviewed precautions/compensations with pt returning demonstration with mod cues to recall.   He displayed masako swallow and lingual press to SLP as prior exercises. Encouraged him to continue these and his respiratory strengthening for airway protection.   Rec tsps thin allowed, swish with water as well.   Follow up SLP indicated to address pt's acute on chronic dysphagia. Pt agreeable to plan.   HPI HPI: 82 yo male adm to [facility] - with AMS, He slipped OOB, tested + for COVID, bladder ca- pt denies states its urinary retention, dementia, GERD s/p [place]- faint reflux on DG esophagram 2012, vocal fold atrophy- takes lots of liquids to swallow pill, skin cancer in situ hx, 2 days weakness, cxr concerning for aspiration- Mucus debris or aspirated content noted in the right mainstem bronchus extending into the bronchus intermedius and right lower lobe bronchi. There is impaction of a right lower lobe bronchus, Infiltration of the fat surrounding the esophagus. ? esophagitis. FEES 2016 showed retention that was improved with chin tuck posture.   MBS done at [facility] 11/2021 showed laryngeal penetration of thin intermittently, incomplete airway closure, residual worse with increased viscocity,  Also question impaired distention of UES, with decreased oral  propulsion.  Pt reports weight loss due to ABX - 20 pounds - and unable to put back on the weight.  Pt also reports problems with tooth and nasal drainage.  Pt has muscle tension dysphonia per notes from [facility] - likely contributing to his dysphagia.  Swallow eval ordered.      SLP Plan  Continue with current plan of care      Recommendations for follow up therapy are one component of a multi-disciplinary discharge planning process, led by the attending physician.  Recommendations may be updated based on patient status, additional functional criteria and insurance authorization.    Recommendations  Diet recommendations: Dysphagia 3 (mechanical soft);Nectar-thick liquid (thin water ok between meals after mouth care -) Medication Administration: Whole meds with puree Supervision: Patient able to self feed Compensations: Small sips/bites;Slow rate;Follow solids with liquid (effortful swallow) Postural Changes and/or Swallow Maneuvers: Seated upright 90 degrees;Upright 30-60 min after meal                Oral Care Recommendations: Oral care BID Follow Up Recommendations: Skilled nursing-short term rehab (<3 hours/day) SLP Visit Diagnosis: Dysphagia, oropharyngeal phase (R13.12);Dysphagia, pharyngoesophageal phase (R13.14) Plan: Continue with current plan of care           Lee Nelson  05/09/2022, 2:19 PM

## 2022-05-09 NOTE — Progress Notes (Signed)
Physical Therapy Treatment Patient Details Name: Lee Nelson MRN: [id number] DOB: January 30, 1941 Today's Date: 05/09/2022   History of Present Illness Patient is a 82 year old male who presented with severe sepsis secondary to COVID 19 pneumonia, acute hypoxemic respiratory failure, hypokalemia.   PMH: GERD, PSVT, chronic thrombocytopenia, skin squamous cell carcinoma, dementia, anxiety, arthritis, osteoporosis, normal pressure hydrocephalus.    PT Comments    Pt focused on dry chicken and does not like his diet.  Attempted to explain diet per SLP recommendation (sign hanging in pt's room).  Pt does not appear to understand. May benefit from palliative consult.  Pt reports he will just have his wife bring in food/drinks.  Pt very agreeable to mobilize despite frustrations with diet and motivated to improve strength and mobility.  Pt requiring increased assist for bed mobility and at least min assist with ambulation due to unsteadiness.  Continue to recommend SNF upon d/c.    Recommendations for follow up therapy are one component of a multi-disciplinary discharge planning process, led by the attending physician.  Recommendations may be updated based on patient status, additional functional criteria and insurance authorization.  Follow Up Recommendations  Skilled nursing-short term rehab (<3 hours/day) Can patient physically be transported by private vehicle: No   Assistance Recommended at Discharge Frequent or constant Supervision/Assistance  Patient can return home with the following A lot of help with bathing/dressing/bathroom;Assistance with cooking/housework;Assist for transportation;Help with stairs or ramp for entrance;A lot of help with walking and/or transfers   Equipment Recommendations  None recommended by PT    Recommendations for Other Services       Precautions / Restrictions Precautions Precautions: Fall     Mobility  Bed Mobility Overal bed mobility: Needs  Assistance Bed Mobility: Supine to Sit     Supine to sit: Mod assist     General bed mobility comments: assist for trunk upright and scooting to EOB    Transfers Overall transfer level: Needs assistance Equipment used: Rolling walker (2 wheels) Transfers: Sit to/from Stand Sit to Stand: Mod assist           General transfer comment: posterior bias against bed to self assist, requiring cues for technique and safety; assist to rise and steady as well as pushing down through RW upon standing for balance (pt was lifting walker)    Ambulation/Gait Ambulation/Gait assistance: Min assist Gait Distance (Feet): 200 Feet Assistive device: Rolling walker (2 wheels) Gait Pattern/deviations: Step-through pattern, Decreased stride length Gait velocity: decr     General Gait Details: requiring min assist for occasional unsteadiness or LOB; pt wanted to ambulate more however recommended moderation at this time   Stairs             Wheelchair Mobility    Modified Rankin (Stroke Patients Only)       Balance Overall balance assessment: Needs assistance       Postural control: Posterior lean Standing balance support: Bilateral upper extremity supported, During functional activity, Reliant on assistive device for balance Standing balance-Leahy Scale: Poor                              Cognition Arousal/Alertness: Awake/alert Behavior During Therapy: WFL for tasks assessed/performed Overall Cognitive Status: Difficult to assess                                 General  Comments: HOH, hx dementia, focused on his diet today (does not like dysphagia diet or nectar liquid)        Exercises      General Comments        Pertinent Vitals/Pain Pain Assessment Pain Assessment: No/denies pain    Home Living                          Prior Function            PT Goals (current goals can now be found in the care plan section)  Progress towards PT goals: Progressing toward goals    Frequency    Min 2X/week      PT Plan Current plan remains appropriate    Co-evaluation              AM-PAC PT "6 Clicks" Mobility   Outcome Measure  Help needed turning from your back to your side while in a flat bed without using bedrails?: A Lot Help needed moving from lying on your back to sitting on the side of a flat bed without using bedrails?: A Lot Help needed moving to and from a bed to a chair (including a wheelchair)?: A Lot Help needed standing up from a chair using your arms (e.g., wheelchair or bedside chair)?: A Lot Help needed to walk in hospital room?: A Lot Help needed climbing 3-5 steps with a railing? : A Lot 6 Click Score: 12    End of Session Equipment Utilized During Treatment: Gait belt Activity Tolerance: Patient tolerated treatment well Patient left: in chair;with call bell/phone within reach;with chair alarm set (telesitter aware pt in recliner, chair alarm on, floor mat in front of recliner) Nurse Communication: Mobility status PT Visit Diagnosis: Unsteadiness on feet (R26.81);Difficulty in walking, not elsewhere classified (R26.2)     Time: [id number] PT Time Calculation (min) (ACUTE ONLY): 40 min Increased time addressing his lunch and diet  Charges:  $Gait Training: 23-37 mins                    Jannette Spanner PT, DPT Physical Therapist Acute Rehabilitation Services Preferred contact method: Secure Chat Weekend Pager Only: [phone] Office: [place] 05/09/2022, 2:00 PM

## 2022-05-09 NOTE — Consult Note (Signed)
Consultation Note Date: 05/09/2022   Patient Name: Lee Nelson  DOB: 04-10-1940  MRN: [id number]  Age / Sex: 82 y.o., male  PCP: Prince Solian, MD Referring Physician: Flora Lipps, MD  Reason for Consultation: Establishing goals of care  HPI/Patient Profile: 82 y.o. male  admitted on 05/06/2022.   Clinical Assessment and Goals of Care: 82 year old gentleman who lives at home with his wife, he is a Sports coach professor, practice constitutional law, past medical history of dementia, anxiety, arthritis, osteoporosis, BPH, nephrolithiasis, PSVT, GERD, chronic thrombocytopenia Patient presented to the emergency department for evaluation of generalized weakness sore throat and a fall. Further workup revealed a positive SARS-CoV-2 PCR, CT head normal pressure hydrocephalus, right lower lobe infiltrate with effusion on CT chest. Patient was admitted to house medicine service for severe sepsis secondary to COVID-19 pneumonia versus possible aspiration. PT OT and speech therapist are following. PMT consulted for CODE STATUS and goals of care discussions has been requested. Elderly gentleman sitting up in bed.  Introduced myself and palliative care as follows: Palliative medicine is specialized medical care for people living with serious illness. It focuses on providing relief from the symptoms and stress of a serious illness. The goal is to improve quality of life for both the patient and the family. Goals of care: Broad aims of medical therapy in relation to the patient's values and preferences. Our aim is to provide medical care aimed at enabling patients to achieve the goals that matter most to them, given the circumstances of their particular medical situation and their constraints.   Brief life review performed.  Discussed about context of current hospitalization.  Discussed about goals and wishes.  Differences  between full code versus DNR/DNI discussed in detail.  Described about scope of current hospitalization, discussed about current disposition recommendation for skilled nursing facility for rehabilitation attempt.  All of his questions and concerns addressed to the best of my ability. Call placed and discussed with son Separate call placed and discussed with wife.   NEXT OF KIN Wife and son  SUMMARY OF RECOMMENDATIONS   DNR DNI No artificial feeding, continue current dysphagia 2 diet. SNF rehab with palliative on discharge.  Code Status/Advance Care Planning: DNR   Symptom Management:     Palliative Prophylaxis:  Delirium Protocol  Additional Recommendations (Limitations, Scope, Preferences): No Artificial Feeding  Psycho-social/Spiritual:  Desire for further Chaplaincy support:yes Additional Recommendations: Caregiving  Support/Resources  Prognosis:  < 12 months  Discharge Planning: [place] for rehab with Palliative care service follow-up      Primary Diagnoses: Present on Admission:  Pneumonia due to COVID-19 virus  PSVT (paroxysmal supraventricular tachycardia)  G E R D   I have reviewed the medical record, interviewed the patient and family, and examined the patient. The following aspects are pertinent.  Past Medical History:  Diagnosis Date   Anxiety    Arthritis    Bladder calculus    BPH (benign prostatic hyperplasia)    Chronic constipation    Complication of anesthesia    "  I had some coughing afterwards for a couple of days"--  per pt "perfers spinal anesthesia since general anesthesia congnitive issues when older"   Diverticulosis of colon    Dry eye syndrome of both eyes    Environmental allergies    GERD (gastroesophageal reflux disease)    occasional   History of adenomatous polyp of colon    08/ 2004   History of kidney stones    History of squamous cell carcinoma in situ (SCCIS) of skin    s/p  excision 2013 facial areas  and 06/ 2016 nose   Migraine    eye migraine occasional   Seasonal and perennial allergic rhinitis    Thrombocytopenia (HCC)    Tingling    hands and feet bilat , intermittantly-- per pt has lumbar bulging disk   Urinary frequency    Vocal fold atrophy    dysphonia-- per pt has to drink large amount of water to take even on pill   Wears glasses    Social History   Socioeconomic History   Marital status: Married    Spouse name: Neoma Laming   Number of children: 1   Years of education: BA, MA, JD   Highest education level: Not on file  Occupational History   Occupation: teaches constitutional law   Occupation: Designer, jewellery: [employer]  Tobacco Use   Smoking status: Never   Smokeless tobacco: Never  Substance and Sexual Activity   Alcohol use: Yes    Comment: social   Drug use: No   Sexual activity: Not on file  Other Topics Concern   Not on file  Social History Narrative   Patient lives at home wife.   Daily caffeine use   Social Determinants of Health   Financial Resource Strain: Not on file  Food Insecurity: No Food Insecurity (05/06/2022)   Hunger Vital Sign    Worried About Running Out of Food in the Last Year: Never true    Ran Out of Food in the Last Year: Never true  Transportation Needs: No Transportation Needs (05/06/2022)   PRAPARE - Hydrologist (Medical): No    Lack of Transportation (Non-Medical): No  Physical Activity: Not on file  Stress: Not on file  Social Connections: Not on file   Family History  Problem Relation Age of Onset   Parkinsonism Brother    COPD Mother    Allergies Mother    Heart failure Mother    COPD Father    Stroke Father    Other Sister    Suicidality Maternal Aunt    Cancer Maternal Grandfather    Scheduled Meds:  acidophilus  1 capsule Oral Daily   vitamin C  250 mg Oral Daily   cholecalciferol  2,000 Units Oral Daily   dexamethasone (DECADRON) injection  6 mg Intravenous  Q24H   docusate sodium  50 mg Oral QHS   enoxaparin (LOVENOX) injection  40 mg Subcutaneous Q24H   zinc sulfate  220 mg Oral Daily   Continuous Infusions:  ampicillin-sulbactam (UNASYN) IV 3 g (05/09/22 0933)   PRN Meds:.acetaminophen, guaiFENesin-dextromethorphan, lip balm, polyvinyl alcohol Medications Prior to Admission:  Prior to Admission medications   Medication Sig Start Date End Date Taking? Authorizing Provider  B Complex Vitamins (VITAMIN B COMPLEX) TABS Take 1 tablet by mouth daily.   Yes [provider]  Cholecalciferol (VITAMIN D-3) 1000 units CAPS Take 2,000 Units by mouth daily.  Yes [provider]  Cranberry 400 MG CAPS Take 400-1,200 mg by mouth every morning.    Yes [provider]  docusate sodium (COLACE) 50 MG capsule Take 1 capsule (50 mg total) by mouth at bedtime. 01/05/20  Yes Antonieta Pert, MD  MAGNESIUM CITRATE PO Take 75 mg by mouth daily.   Yes [provider]  Menaquinone-7 (VITAMIN K2 PO) Take 120-240 mg by mouth daily. Combo w/ Vit. D   Yes [provider]  nirmatrelvir & ritonavir (PAXLOVID, 300/100,) 20 x 150 MG & 10 x 100MG TBPK Take 3 tablets by mouth 2 (two) times daily for 5 days. 05/06/22 05/11/22 Yes Jeanell Sparrow, DO  nirmatrelvir & ritonavir (PAXLOVID, 300/100,) 20 x 150 MG & 10 x 100MG TBPK Take 3 tablets by mouth 2 (two) times daily.   Yes [provider]  NON FORMULARY Take 1 capsule by mouth daily. Acetyl L carnitine   Yes [provider]  OVER THE COUNTER MEDICATION Take 3 capsules by mouth 2 (two) times daily. Lion's mane 0.5 gram/capsule   Yes [provider]  OVER THE COUNTER MEDICATION Take 2 capsules by mouth every morning. Reparagen supplement   Yes [provider]  Phosphatidylserine 100 MG CAPS Take 100 mg by mouth every morning.    Yes [provider]  Probiotic Product (PROBIOTIC DAILY PO) Take 1 capsule by mouth daily.    Yes [provider]   Propylene Glycol (SYSTANE BALANCE) 0.6 % SOLN Apply 1 drop to eye daily as needed (dry eyes).    Yes [provider]  sodium chloride (MURO 128) 5 % ophthalmic ointment Place 1 application into both eyes at bedtime.    Yes [provider]  sodium chloride (MURO 128) 5 % ophthalmic solution See admin instructions. 09/01/20  Yes [provider]  UNABLE TO FIND Med Name: cocovia memory   Yes [provider]  vitamin C (ASCORBIC ACID) 250 MG tablet Take 250 mg by mouth daily.   Yes [provider]   Allergies  Allergen Reactions   Ciprofloxacin     JOINT PAIN   Flagyl [Metronidazole] Other (See Comments)    REACTION: no appetite, diarrhea after meal, decrease in weight   Metoclopramide Hcl Other (See Comments)    REACTION: "involuntary movements"   Other Other (See Comments)    Antibiotics have unknown reaction propophol causes memory problems   Propofol Other (See Comments)    Other Reaction(s): Other (See Comments)  Memory problems   Risperidone Other (See Comments)    "do not want"   Silodosin     ? Possibly allergy, could not breathe well out of nose   Soy Allergy Other (See Comments)    Stomach upset, "gas"  Other Reaction(s): Other (See Comments)   Sulfamethoxazole-Trimethoprim Other (See Comments)    REACTION: "involuntary movements"  tripac antibiotic- heart rythm  Other Reaction(s): Other (See Comments)  Unknown reaction   Review of Systems Complains of cough with sputum production, complains of congestion. Physical Exam Frail, chronically ill-appearing gentleman resting in bed Not on supplemental oxygen Diminished breath sounds S1-S2 Abdomen not distended Has muscle wasting Awake alert oriented Mood and affect within normal limits Generalized weakness  Vital Signs: BP 134/82 (BP Location: Right Arm)   Pulse 67   Temp 98.1 F (36.7 C) (Oral)   Resp 18   Ht 5' 8"$  (1.727 m)   Wt 55 kg   SpO2 96%   BMI 18.44  kg/m  Pain Scale: 0-10   Pain Score: 0-No pain   SpO2: SpO2: 96 % O2 Device:SpO2: 96 % O2 Flow Rate: .O2 Flow Rate (L/min): 3 L/min  IO: Intake/output summary:  Intake/Output Summary (Last 24 hours) at 05/09/2022 1203 Last data filed at 05/09/2022 0700 Gross per 24 hour  Intake 460 ml  Output 1000 ml  Net -540 ml    LBM: Last BM Date :  (PTA) Baseline Weight: Weight: 55 kg Most recent weight: Weight: 55 kg     Palliative Assessment/Data:   PPS 40%  Time In:  12 Time Out:  1300 Time Total:  60 Greater than 50%  of this time was spent counseling and coordinating care related to the above assessment and plan.  Signed by: Loistine Chance, MD   Please contact Palliative Medicine Team phone at [phone] for questions and concerns.  For individual provider: See Shea Evans

## 2022-05-10 DIAGNOSIS — G912 (Idiopathic) normal pressure hydrocephalus: Secondary | ICD-10-CM | POA: Diagnosis not present

## 2022-05-10 DIAGNOSIS — J1282 Pneumonia due to coronavirus disease 2019: Secondary | ICD-10-CM | POA: Diagnosis not present

## 2022-05-10 DIAGNOSIS — J9601 Acute respiratory failure with hypoxia: Secondary | ICD-10-CM | POA: Diagnosis not present

## 2022-05-10 DIAGNOSIS — D696 Thrombocytopenia, unspecified: Secondary | ICD-10-CM | POA: Diagnosis not present

## 2022-05-10 DIAGNOSIS — U071 COVID-19: Secondary | ICD-10-CM | POA: Diagnosis not present

## 2022-05-10 LAB — CBC
HCT: 38.6 % — ABNORMAL LOW (ref 39.0–52.0)
Hemoglobin: 12.8 g/dL — ABNORMAL LOW (ref 13.0–17.0)
MCH: 32 pg (ref 26.0–34.0)
MCHC: 33.2 g/dL (ref 30.0–36.0)
MCV: 96.5 fL (ref 80.0–100.0)
Platelets: 104 10*3/uL — ABNORMAL LOW (ref 150–400)
RBC: 4 MIL/uL — ABNORMAL LOW (ref 4.22–5.81)
RDW: 12.9 % (ref 11.5–15.5)
WBC: 9 10*3/uL (ref 4.0–10.5)
nRBC: 0 % (ref 0.0–0.2)

## 2022-05-10 LAB — MAGNESIUM: Magnesium: 2 mg/dL (ref 1.7–2.4)

## 2022-05-10 LAB — BASIC METABOLIC PANEL
Anion gap: 8 (ref 5–15)
BUN: 29 mg/dL — ABNORMAL HIGH (ref 8–23)
CO2: 26 mmol/L (ref 22–32)
Calcium: 8.1 mg/dL — ABNORMAL LOW (ref 8.9–10.3)
Chloride: 106 mmol/L (ref 98–111)
Creatinine, Ser: 0.59 mg/dL — ABNORMAL LOW (ref 0.61–1.24)
GFR, Estimated: >60 mL/min (ref >60)
Glucose, Bld: 181 mg/dL — ABNORMAL HIGH (ref 70–99)
Potassium: 3.7 mmol/L (ref 3.5–5.1)
Sodium: 140 mmol/L (ref 135–145)

## 2022-05-10 MED ORDER — MELATONIN 3 MG PO TABS
3.0000 mg | ORAL_TABLET | Freq: Every day | ORAL | Status: DC
  Administered 2022-05-10 – 2022-05-14 (×5): 3 mg via ORAL
  Filled 2022-05-10 (×5): qty 1

## 2022-05-10 NOTE — Progress Notes (Signed)
Daily Progress Note   Patient Name: Lee Nelson       Date: 05/10/2022 DOB: 1940/05/15  Age: 82 y.o. MRN#: [id number] Attending Physician: Flora Lipps, MD Primary Care Physician: Prince Solian, MD Admit Date: 05/06/2022  Reason for Consultation/Follow-up: Establishing goals of care  Subjective: Awake alert no distress  Length of Stay: 4  Current Medications: Scheduled Meds:   acidophilus  1 capsule Oral Daily   vitamin C  250 mg Oral Daily   cholecalciferol  2,000 Units Oral Daily   dexamethasone (DECADRON) injection  6 mg Intravenous Q24H   docusate sodium  50 mg Oral QHS   enoxaparin (LOVENOX) injection  40 mg Subcutaneous Q24H   melatonin  3 mg Oral QHS   zinc sulfate  220 mg Oral Daily    Continuous Infusions:  ampicillin-sulbactam (UNASYN) IV 3 g (05/10/22 1014)    PRN Meds: acetaminophen, guaiFENesin-dextromethorphan, lip balm, polyvinyl alcohol  Physical Exam         Regular work of breathing Not on O 2 [place] Frail appearing elderly gentleman No edema  Vital Signs: BP 121/62 (BP Location: Right Arm)   Pulse 70   Temp 97.7 F (36.5 C) (Oral)   Resp 18   Ht 5' 8"$  (1.727 m)   Wt 55 kg   SpO2 95%   BMI 18.44 kg/m  SpO2: SpO2: 95 % O2 Device: O2 Device: Room Air O2 Flow Rate: O2 Flow Rate (L/min): 3 L/min  Intake/output summary:  Intake/Output Summary (Last 24 hours) at 05/10/2022 1219 Last data filed at 05/10/2022 0900 Gross per 24 hour  Intake 720 ml  Output 1050 ml  Net -330 ml   LBM: Last BM Date : 05/10/22 Baseline Weight: Weight: 55 kg Most recent weight: Weight: 55 kg       Palliative Assessment/Data:      Patient Active Problem List   Diagnosis Date Noted   Pneumonia due to COVID-19 virus 05/06/2022   Severe sepsis ([place])  05/06/2022   Acute hypoxemic respiratory failure (HCC) 05/06/2022   NPH (normal pressure hydrocephalus) ([place]) 05/06/2022   Physical deconditioning 05/06/2022   Thrombocytopenia ([place]) 05/06/2022   Dementia ([place]) 05/06/2022   SBO (small bowel obstruction) ([place]) 12/31/2019   Amaurosis fugax of right eye 08/25/2017   PSVT (paroxysmal supraventricular tachycardia) 09/12/2015   Chest pain  05/03/2015   Dyspnea 05/03/2015   ALLERGIC RHINITIS 09/21/2007   G E R D 09/21/2007   SMOKE INHALATION 09/21/2007   OSTEOPOROSIS 01/28/2007   PALPITATIONS 01/28/2007   COUGH 01/28/2007   ALLERGY 01/28/2007    Palliative Care Assessment & Plan   Patient Profile:    Assessment:  82 year old gentleman who lives at home with his wife, he is a Sports coach professor, practice constitutional law, past medical history of dementia, anxiety, arthritis, osteoporosis, BPH, nephrolithiasis, PSVT, GERD, chronic thrombocytopenia Patient presented to the emergency department for evaluation of generalized weakness sore throat and a fall. Further workup revealed a positive SARS-CoV-2 PCR, CT head normal pressure hydrocephalus, right lower lobe infiltrate with effusion on CT chest. Patient was admitted to house medicine service for severe sepsis secondary to COVID-19 pneumonia versus possible aspiration.  Recommendations/Plan: Add melatonin for sleep Continue current mode of care DNR DNI SNF rehab with palliative services to follow on discharge.   Goals of Care and Additional Recommendations: Limitations on Scope of Treatment: No Artificial Feeding  Code Status:    Code Status Orders  (From admission, onward)           Start     Ordered   05/09/22 1328  Do not attempt resuscitation (DNR)  Continuous       Question Answer Comment  If patient has no pulse and is not breathing Do Not Attempt Resuscitation   If patient has a pulse and/or is breathing: Medical Treatment Goals LIMITED ADDITIONAL INTERVENTIONS: Use  medication/IV fluids and cardiac monitoring as indicated; Do not use intubation or mechanical ventilation (DNI), also provide comfort medications.  Transfer to Progressive/Stepdown as indicated, avoid Intensive Care.   Consent: Discussion documented in EHR or advanced directives reviewed      05/09/22 1327           Code Status History     Date Active Date Inactive Code Status Order ID Comments User Context   05/06/2022 2115 05/09/2022 1327 Full Code [id number]  Shela Leff, MD ED   12/31/2019 1735 01/05/2020 1526 Full Code [id number]  Mercy Riding, MD ED      Advance Directive Documentation    Flowsheet Row Most Recent Value  Type of Advance Directive Healthcare Power of Attorney  Pre-existing out of facility DNR order (yellow form or pink MOST form) --  "MOST" Form in Place? --       Prognosis:  Unable to determine  Discharge Planning: [place] for rehab with Palliative care service follow-up  Care plan was discussed with IDT  Thank you for allowing the Palliative Medicine Team to assist in the care of this patient.  Low MDM     Greater than 50%  of this time was spent counseling and coordinating care related to the above assessment and plan.  Loistine Chance, MD  Please contact Palliative Medicine Team phone at [phone] for questions and concerns.

## 2022-05-10 NOTE — Progress Notes (Signed)
PROGRESS NOTE    [name]  [id number] DOB: February 28, 1941 DOA: 05/06/2022 PCP: Prince Solian, MD    Brief Narrative:  Lee Nelson is a 82 y.o. male with medical history significant of dementia, anxiety, arthritis, osteoporosis, BPH, nephrolithiasis, PSVT, GERD, chronic thrombocytopenia presented to the ED for evaluation of generalized weakness, sore throat, and fever for 2 days with generalized weakness.  Patient also slipped out of his bed.  Of note, patient had been non ambulatory recently and had been having difficulty completing his ADLs.  Patient was referred to the ED by his primary care physician.  In the ED, patient was febrile with a temperature of 102.6.  Fahrenheit and tachycardic , pulse ox was 80% on room air.  Was placed on 3 L of oxygen with improvement.  Laboratory data showed no leukocytosis but mild thrombocytopenia at 103K, SARS-CoV-2 PCR positive.  CT head showing findings of normal pressure hydrocephalus.  CT of cervical, thoracic, and lumbar spine negative for acute finding.  CT angiogram without  evidence of pulmonary embolism but right lower lobe infiltrate with effusion.  Patient received 500 mL of normal saline bolus and was admitted hospital for further evaluation and treatment.  Assessment and plan.  Severe sepsis secondary to COVID-19 pneumonia versus possible aspiration pneumonia with acute hypoxemic respiratory failure  CT angiogram after chest negative for PE but with right-sided infiltrate with small effusion.  Patient has completed course of remdesivir.  Continue dexamethasone.  BNP within normal limits.  Initial procalcitonin elevated at 2.4.   Airborne precautions.  Continue incentive spirometry.   Lactic acid was 0.8.  No leukocytosis and temperature has improved to 98.8 F.   Patient is currently on room air and was initially on 3 L of oxygen.  Blood cultures showing Streptococcus in 1 bottle.  Likely contaminant.  Patient is already on Unasyn.  Speech therapy has seen the patient and patient is on dysphagia 2 diet.  Palliative care on board and patient is DNR.  Would benefit from palliative care as outpatient at the skilled nursing facility.  Hypokalemia.  Potassium of 4.  Improved after replacement   Normal pressure hydrocephalus ED physician spoke with Dr. Rory Percy neurology due to abnormal CT scan.  CT scan of the head is unchanged from previous and recommended outpatient neurosurgery follow-up.    Chronic thrombocytopenia No signs of bleeding.  Will continue to monitor.  Latest platelet count of 104.  Dementia  -Delirium precautions   Anxiety/BPH Stable at this time.  History of PSVT: Continue telemetry monitor.  GERD Not on medications.   Generalized weakness/physical deconditioning/fall risk Continue fall precautions.  At baseline, was able to do things by himself at home.  PT OT has recommended skilled nursing facility placement at this time.    DVT prophylaxis: enoxaparin (LOVENOX) injection 40 mg Start: 05/06/22 2200   Code Status:     Code Status: DNR  Disposition: Skilled nursing facility, awaiting for COVID isolation..  Status is: Inpatient  Remains inpatient appropriate because:  IV antibiotic, right lower lobe pneumonia, possible aspiration, COVID Pneumonia, need for rehabitation.   Family Communication:   Spoke with the patient's son on the phone and updated him about the clinical condition of the patient on 05/07/2022.  Consultants:  Palliative care  Procedures:  None  Antimicrobials:  Remdesivir- completed Unasyn 2/6>  Anti-infectives (From admission, onward)    Start     Dose/Rate Route Frequency Ordered Stop   05/07/22 1000  remdesivir 100 mg in sodium  chloride 0.9 % 100 mL IVPB       See Hyperspace for full Linked Orders Report.   100 mg 200 mL/hr over 30 Minutes Intravenous Daily 05/06/22 2115 05/08/22 1230   05/06/22 2200  Ampicillin-Sulbactam (UNASYN) 3 g in sodium chloride 0.9 %  100 mL IVPB        3 g 200 mL/hr over 30 Minutes Intravenous Every 6 hours 05/06/22 2120     05/06/22 2115  remdesivir 200 mg in sodium chloride 0.9% 250 mL IVPB       See Hyperspace for full Linked Orders Report.   200 mg 580 mL/hr over 30 Minutes Intravenous Once 05/06/22 2115 05/06/22 2325   05/06/22 0000  nirmatrelvir & ritonavir (PAXLOVID, 300/100,) 20 x 150 MG & 10 x 100MG TBPK        3 tablet Oral 2 times daily 05/06/22 1827 05/11/22 2359      Subjective:  Today, patient was seen and examined at bedside.  Still complains of mild cough with productive sputum.  Denies any fever, chills or rigor.  Has generalized weakness.  Objective: Vitals:   05/09/22 1300 05/09/22 1424 05/09/22 2258 05/10/22 0635  BP:  (!) 148/76 (!) 109/96 121/62  Pulse:  89 83 70  Resp:  18 18 18  $ Temp: 98.1 F (36.7 C) 98.1 F (36.7 C) 98.1 F (36.7 C) 97.7 F (36.5 C)  TempSrc:   Oral Oral  SpO2:  94% 96% 95%  Weight:      Height:        Intake/Output Summary (Last 24 hours) at 05/10/2022 1054 Last data filed at 05/10/2022 0900 Gross per 24 hour  Intake 720 ml  Output 1050 ml  Net -330 ml    Filed Weights   05/06/22 1727  Weight: 55 kg    Physical Examination: Body mass index is 18.44 kg/m.   General: Chronically ill, thinly built, alert awake and Communicative,  HENT:   No scleral pallor or icterus noted. Oral mucosa is moist.  Chest:   Diminished breath sounds bilaterally. CVS: S1 &S2 heard. No murmur.  Regular rate and rhythm. Abdomen: Soft, nontender, nondistended.  Bowel sounds are heard.   Extremities: No cyanosis, clubbing or edema.  Peripheral pulses are palpable. Psych: Alert, awake and oriented, normal mood CNS:  No cranial nerve deficits.  Generalized weakness noted. Skin: Warm and dry.  No rashes noted.  Data Reviewed:   CBC: Recent Labs  Lab 05/06/22 0955 05/07/22 0021 05/08/22 0508 05/09/22 0351 05/10/22 0527  WBC 8.8 9.1 10.6* 8.1 9.0  NEUTROABS 7.4  --    --   --   --   HGB 13.8 13.9 14.8 13.9 12.8*  HCT 40.9 40.6 43.6 41.2 38.6*  MCV 94.9 95.5 95.0 96.0 96.5  PLT 103* 99* 113* 104* 104*     Basic Metabolic Panel: Recent Labs  Lab 05/06/22 0955 05/07/22 0021 05/08/22 0508 05/09/22 0351 05/10/22 0527  NA 135 136 138 140 140  K 3.5 3.3* 4.0 4.1 3.7  CL 101 100 104 106 106  CO2 23 22 24 25 26  $ GLUCOSE 104* 97 145* 160* 181*  BUN 23 21 34* 27* 29*  CREATININE 0.74 0.77 0.64 0.60* 0.59*  CALCIUM 8.5* 8.0* 8.2* 8.3* 8.1*  MG  --   --  2.1 2.1 2.0     Liver Function Tests: Recent Labs  Lab 05/07/22 0021  AST 121*  ALT 35  ALKPHOS 44  BILITOT 1.1  PROT 5.4*  ALBUMIN  3.2*      Radiology Studies: DG Swallowing Func-Speech Pathology  Result Date: 05/08/2022 Table formatting from the original result was not included. Modified Barium Swallow Study Patient Details Name: DOCTOR COBAS MRN: [id number] Date of Birth: 1940/07/25 Today's Date: 05/08/2022 HPI/PMH: HPI: 82 yo male adm to [facility] - with AMS, He slipped OOB, tested + for COVID, bladder ca- pt denies states its urinary retention, dementia, GERD s/p [employer]- faint reflux on DG esophagram 2012, vocal fold atrophy- takes lots of liquids to swallow pill, skin cancer in situ hx, 2 days weakness, cxr concerning for aspiration- Mucus debris or aspirated content noted in the right mainstem bronchus extending into the bronchus intermedius and right lower lobe bronchi. There is impaction of a right lower lobe bronchus, Infiltration of the fat surrounding the esophagus. ? esophagitis. FEES 2016 showed retention that was improved with chin tuck posture.   MBS done at [facility] 11/2021 showed laryngeal penetration of thin intermittently, incomplete airway closure, residual worse with increased viscocity,  Also question impaired distention of UES, with decreased oral propulsion.  Pt reports weight loss due to ABX - 20 pounds - and unable to put back on the weight.  Pt also reports problems with tooth  and nasal drainage.  Pt has muscle tension dysphonia per notes from [facility] - likely contributing to his dysphagia.  Swallow eval ordered. Clinical Impression: Clinical Impression: Pt presents with oropharyngeal dysphagia characterized by reduction in bolus cohesion, tongue base retraction, laryngeal elevation, anterior laryngeal movement, and epiglottic inversion. The swallow was often triggered with the head of the bolus at the level valleculae or the pyriform sinuses. Pt demonstrated vallecular residue, posterior pharyngeal wall residue, pyriform sinus residue, and minimal PES distention. Pharyngeal residue was improved, but not eliminated with a liquid wash and pt's independent use of secondary swallows also reduced residue. Penetration (PAS 5) and aspiration (PAS 7, 8) were noted with thin liquids and with nectar thick liquids. Aspiration occurred before, during, and after secondary to the pharyngeal delay, reduced laryngeal vestibule closure, and pharyngeal residue, respectively. Coughing was noted with larger amounts of aspiration, but smaller quantities did not trigger coughing; pt's cough was ineffective in expelling aspirated material. Both an effortful swallow and chin tuck eliminated aspiration of nectar thick liquids and reduced quantity of aspiration or delayed aspiration with thin liquids.  Pt's overall swallow function today appears worse than that noted with his modified barium swallow study in September 2023. SLP suspects acute on chronic dysphagia and he is currently at risk for dysphagia-related adverse events in the setting of GERD, and acute illness. Palliative consult may be useful to help establish GOC; Dr. Louanne Belton advised of pt's performance and recommendations. A dysphagia 2 diet with nectar thick liquids is recommended at this time with strict observance of swallowing precautions and SLP will continue to follow pt. Factors that may increase risk of adverse event in presence of aspiration  Phineas Douglas & Padilla 2021): Respiratory or GI disease Recommendations/Plan: Swallowing Evaluation Recommendations Swallowing Evaluation Recommendations Recommendations: PO diet PO Diet Recommendation: Dysphagia 2 (Finely chopped); Mildly thick liquids (Level 2, nectar thick) Liquid Administration via: Cup; No straw Medication Administration: Crushed with puree Supervision: Staff to assist with self-feeding; Full supervision/cueing for swallowing strategies Swallowing strategies  : Slow rate; Small bites/sips; Follow solids with liquids; Multiple dry swallows after each bite/sip (Effortful swallow) Postural changes: Position pt fully upright for meals; Stay upright 30-60 min after meals Oral care recommendations: Oral care BID (2x/day) Recommended consults: Consider Palliative care  Treatment Plan Treatment Plan Treatment recommendations: Therapy as outlined in treatment plan below Follow-up recommendations: Skilled nursing-short term rehab (<3 hours/day) Functional status assessment: Patient has had a recent decline in their functional status and/or demonstrates limited ability to make significant improvements in function in a reasonable and predictable amount of time. Treatment frequency: Min 2x/week Treatment duration: 2 weeks Interventions: Diet toleration management by SLP; Trials of upgraded texture/liquids Recommendations Recommendations for follow up therapy are one component of a multi-disciplinary discharge planning process, led by the attending physician.  Recommendations may be updated based on patient status, additional functional criteria and insurance authorization. Assessment: Orofacial Exam: Orofacial Exam Oral Cavity - Dentition: Adequate natural dentition Anatomy: Anatomy: WFL Thin Liquids: Thin Liquids (Level 0) Thin Liquids : Impaired Bolus delivery method: Cup Thin Liquid - Impairment: Oral Impairment; Pharyngeal impairment Lip Closure: No labial escape Tongue control during bolus hold: Posterior  escape of less than half of bolus Bolus transport/lingual motion: Brisk tongue motion Oral residue: Trace residue lining oral structures Location of oral residue : Tongue Initiation of swallow : Pyriform sinuses Soft palate elevation: Partial Laryngeal elevation: Partial or minimal superior movement and approximation Anterior hyoid excursion: Partial Epiglottic movement: Partial Laryngeal vestibule closure: Incomplete Pharyngeal stripping wave : Present - diminished Pharyngeal contraction (A/P view only): N/A Pharyngoesophageal segment opening: Minimal distention/minimal duration, marked obstruction of flow Tongue base retraction: Wide column of contrast or air between tongue base and PPW Pharyngeal residue: Collection of residue within or on pharyngeal structures Location of pharyngeal residue: Valleculae; Pharyngeal wall; Pyriform sinuses Penetration/Aspiration Scale (PAS) score: 7.  Material enters airway, passes BELOW cords and not ejected out despite cough attempt by patient; 8.  Material enters airway, passes BELOW cords without attempt by patient to eject out (silent aspiration)  Mildly Thick Liquids: Mildly thick liquids (Level 2, nectar thick) Mildly thick liquids (Level 2, nectar thick): Impaired Bolus delivery method: Cup Mildly Thick Liquid - Impairment: Oral Impairment; Pharyngeal impairment Lip Closure: No labial escape Tongue control during bolus hold: Cohesive bolus between tongue to palatal seal Bolus transport/lingual motion: Brisk tongue motion Oral residue: Trace residue lining oral structures Location of oral residue : Tongue Initiation of swallow : Valleculae Soft palate elevation: Partial Laryngeal elevation: Partial or minimal superior movement and approximation Anterior hyoid excursion: Partial Epiglottic movement: Partial Laryngeal vestibule closure: Incomplete Pharyngeal stripping wave : Present - diminished Pharyngeal contraction (A/P view only): N/A Pharyngoesophageal segment opening:  Minimal distention/minimal duration, marked obstruction of flow Tongue base retraction: Wide column of contrast or air between tongue base and PPW Pharyngeal residue: Collection of residue within or on pharyngeal structures Location of pharyngeal residue: Valleculae; Pharyngeal wall; Pyriform sinuses Penetration/Aspiration Scale (PAS) score: 7.  Material enters airway, passes BELOW cords and not ejected out despite cough attempt by patient  Moderately Thick Liquids: Moderately thick liquids (Level 3, honey thick) Moderately thick liquids (Level 3, honey thick): Impaired Bolus delivery method: Spoon; Cup Moderately Thick Liquid - Impairment: Pharyngeal impairment Initiation of swallow : Valleculae Soft palate elevation: Complete Laryngeal elevation: Partial or minimal superior movement and approximation Anterior hyoid excursion: Partial Epiglottic movement: Partial Laryngeal vestibule closure: Incomplete Pharyngeal stripping wave : Present - diminished Pharyngeal contraction (A/P view only): N/A Pharyngoesophageal segment opening: Minimal distention/minimal duration, marked obstruction of flow Tongue base retraction: Wide column of contrast or air between tongue base and PPW Pharyngeal residue: Collection of residue within or on pharyngeal structures Location of pharyngeal residue: Valleculae; Pharyngeal wall; Pyriform sinuses Penetration/Aspiration Scale (PAS) score: 1.  Material does not enter airway  Puree: Puree Puree: Impaired Puree - Impairment: Pharyngeal impairment; Oral Impairment Lip Closure: No labial escape Bolus transport/lingual motion: Slow tongue motion Oral residue: Residue collection on oral structures Location of oral residue : Tongue Initiation of swallow: Valleculae Soft palate elevation: Complete Laryngeal elevation: Partial or minimal superior movement and approximation Anterior hyoid excursion: Partial Epiglottic movement: Partial (but better than with less viscous consistencies.) Laryngeal  vestibule closure: Complete: No air/contrast in laryngeal vestibule Pharyngeal stripping wave : Present - diminished Pharyngeal contraction (A/P view only): N/A Pharyngoesophageal segment opening: Minimal distention/minimal duration, marked obstruction of flow Tongue base retraction: Wide column of contrast or air between tongue base and PPW Pharyngeal residue: Collection of residue within or on pharyngeal structures Location of pharyngeal residue: Valleculae; Pharyngeal wall; Pyriform sinuses Penetration/Aspiration Scale (PAS) score: 1.  Material does not enter airway Solid: Solid Solid: Impaired Solid - Impairment: Pharyngeal impairment Initiation of swallow: Posterior angle of the ramus Soft palate elevation: Complete Laryngeal elevation: Partial or minimal superior movement and approximation Anterior hyoid excursion: Partial Epiglottic movement: Partial Laryngeal vestibule closure: Incomplete Pharyngeal stripping wave : Present - diminished Pharyngeal contraction (A/P view only): N/A Pharyngoesophageal segment opening: Minimal distention/minimal duration, marked obstruction of flow Tongue base retraction: Wide column of contrast or air between tongue base and PPW Pharyngeal residue: Collection of residue within or on pharyngeal structures Location of pharyngeal residue: Valleculae; Pharyngeal wall; Pyriform sinuses Penetration/Aspiration Scale (PAS) score: 1.  Material does not enter airway Pill: Pill Pill: Not Tested Compensatory Strategies: Compensatory Strategies Compensatory strategies: Yes Straw: Effective; Ineffective Effortful swallow: Effective; Ineffective Effective Effortful Swallow: Mildly thick liquid (Level 2, nectar thick) Ineffective Effortful Swallow: Thin liquid (Level 0) Multiple swallows: Effective Effective Multiple Swallows: Puree; Thin liquid (Level 0); Mildly thick liquid (Level 2, nectar thick); Moderately thick liquid (Level 3, honey thick); Solid (reduced, but did not eliminate residue)  Chin tuck: Ineffective; Effective Effective Chin Tuck: Mildly thick liquid (Level 2, nectar thick) Ineffective Chin Tuck: Thin liquid (Level 0); Mildly thick liquid (Level 2, nectar thick) Liquid wash: Effective Effective Liquid Wash: Puree; Solid   General Information: Caregiver present: No  Diet Prior to this Study: Dysphagia 3 (mechanical soft); Thin liquids (Level 0)   Temperature : Normal   Respiratory Status: WFL   Supplemental O2: None (Room air)   History of Recent Intubation: No  Behavior/Cognition: Alert; Cooperative Self-Feeding Abilities: Able to self-feed No data recorded Volitional Cough: -- (N/A) Volitional Swallow: Able to elicit Exam Limitations: Poor positioning Goal Planning: Prognosis for improved oropharyngeal function: Fair Barriers to Reach Goals: Time post onset; Severity of deficits No data recorded Patient/Family Stated Goal: none stated Consulted and agree with results and recommendations: Patient Pain: Pain Assessment Pain Assessment: Faces Faces Pain Scale: 0 Pain Location: legs Pain Descriptors / Indicators: Discomfort; Constant Pain Intervention(s): Monitored during session End of Session: Start Time:SLP Start Time (ACUTE ONLY): 1230 Stop Time: SLP Stop Time (ACUTE ONLY): 1301 Time Calculation:SLP Time Calculation (min) (ACUTE ONLY): 31 min Charges: SLP Evaluations $ SLP Speech Visit: 1 Visit SLP Evaluations $BSS Swallow: 1 Procedure $MBS Swallow: 1 Procedure $Swallowing Treatment: 1 Procedure SLP visit diagnosis: SLP Visit Diagnosis: Dysphagia, oropharyngeal phase (R13.12); Dysphagia, pharyngoesophageal phase (R13.14) Past Medical History: Past Medical History: Diagnosis Date  Anxiety   Arthritis   Bladder calculus   BPH (benign prostatic hyperplasia)   Chronic constipation   Complication of anesthesia   " I had some coughing afterwards for a couple of days"--  per pt "perfers spinal anesthesia since general anesthesia congnitive issues when older"  Diverticulosis of colon   Dry eye  syndrome of both eyes   Environmental allergies   GERD (gastroesophageal reflux disease)   occasional  History of adenomatous polyp of colon   08/ 2004  History of kidney stones   History of squamous cell carcinoma in situ (SCCIS) of skin   s/p  excision 2013 facial areas and 06/ 2016 nose  Migraine   eye migraine occasional  Seasonal and perennial allergic rhinitis   Thrombocytopenia (HCC)   Tingling   hands and feet bilat , intermittantly-- per pt has lumbar bulging disk  Urinary frequency   Vocal fold atrophy   dysphonia-- per pt has to drink large amount of water to take even on pill  Wears glasses  Past Surgical History: Past Surgical History: Procedure Laterality Date  APPENDECTOMY  child  CARDIOVASCULAR STRESS TEST  05-17-2015  dr hilty  Low risk nuclear study w/ no ischemia/  normal LV function and wall motion , stress ef 54% (lvef 45-54%)  CHOLECYSTECTOMY N/A 09/29/2014  Procedure: LAPAROSCOPIC CHOLECYSTECTOMY WITH INTRAOPERATIVE CHOLANGIOGRAM;  Surgeon: Alphonsa Overall, MD;  Location: [facility];  Service: General;  Laterality: N/A;  COLONOSCOPY  last one 09-06-2010  ESOPHAGOGASTRODUODENOSCOPY N/A 12/09/2013  Procedure: ESOPHAGOGASTRODUODENOSCOPY (EGD);  Surgeon: Arta Silence, MD;  Location[facility];  Service: Endoscopy;  Laterality: N/A;  EXTRACORPOREAL SHOCK WAVE LITHOTRIPSY  yrs ago  INGUINAL HERNIA REPAIR Left child  inguinal hernia repair  [id number]  NISSEN FUNDOPLICATION  [id number]  open  STONE EXTRACTION WITH BASKET N/A 08/18/2016  Procedure: STONE EXTRACTION WITH BASKET;  Surgeon: Carolan Clines, MD;  Location: [facility];  Service: Urology;  Laterality: N/A;  THULIUM LASER TURP (TRANSURETHRAL RESECTION OF PROSTATE) N/A 08/18/2016  Procedure: THULIUM LASER BLADDER NECK INCISION AND BLADDER STONE REMOVAL;  Surgeon: Carolan Clines, MD;  Location: [facility];  Service: Urology;  Laterality: N/A;  TONSILLECTOMY  child  TRANSTHORACIC ECHOCARDIOGRAM  11/18/2010  grade 1  diastolic dysfunction, ef [id number]  trivial MR and TR/ mild dilated RA Shanika I. Hardin Negus, [place], [place] Office number [phone] Horton Marshall 05/08/2022, 2:28 PM     LOS: 4 days    Flora Lipps, MD [facility] Available via Epic secure chat 7am-7pm After these hours, please refer to coverage provider listed on [email or web address] 05/10/2022, 10:54 AM

## 2022-05-10 NOTE — Plan of Care (Signed)

## 2022-05-10 NOTE — Progress Notes (Signed)
Mobility Specialist - Progress Note   05/10/22 1135  Mobility  Activity Ambulated with assistance in hallway  Level of Assistance Standby assist, set-up cues, supervision of patient - no hands on  Assistive Device Front wheel walker  Distance Ambulated (ft) 700 ft  Range of Motion/Exercises Active  Activity Response Tolerated well  Mobility Referral Yes  $Mobility charge 1 Mobility   Pt was found in bed and agreeable to ambulate. Had some coughing throughout session. At EOS returned to recliner chair with necessities in reach and chair alarm on. Became a little confused at EOS stating "the school has my phone" and when asked what school stated [facility]. Although afterwards he did realize he was at the hospital. RN notified of session.  Ferd Hibbs Mobility Specialist

## 2022-05-11 DIAGNOSIS — J9601 Acute respiratory failure with hypoxia: Secondary | ICD-10-CM | POA: Diagnosis not present

## 2022-05-11 DIAGNOSIS — D696 Thrombocytopenia, unspecified: Secondary | ICD-10-CM | POA: Diagnosis not present

## 2022-05-11 DIAGNOSIS — U071 COVID-19: Secondary | ICD-10-CM | POA: Diagnosis not present

## 2022-05-11 DIAGNOSIS — G912 (Idiopathic) normal pressure hydrocephalus: Secondary | ICD-10-CM | POA: Diagnosis not present

## 2022-05-11 NOTE — Progress Notes (Signed)
PROGRESS NOTE    Lee Nelson  [id number] DOB: 1940/04/11 DOA: 05/06/2022 PCP: Prince Solian, MD    Brief Narrative:  Lee Nelson is a 82 y.o. male with medical history significant of dementia, anxiety, arthritis, osteoporosis, BPH, nephrolithiasis, PSVT, GERD, chronic thrombocytopenia presented to the ED for evaluation of generalized weakness, sore throat, and fever for 2 days with generalized weakness.  Patient also slipped out of his bed.  Of note, patient had been non ambulatory recently and had been having difficulty completing his ADLs.  Patient was referred to the ED by his primary care physician.  In the ED, patient was febrile with a temperature of 102.6.  Fahrenheit and tachycardic , pulse ox was 80% on room air.  Was placed on 3 L of oxygen with improvement.  Laboratory data showed no leukocytosis but mild thrombocytopenia at 103K, SARS-CoV-2 PCR positive.  CT head showing findings of normal pressure hydrocephalus.  CT of cervical, thoracic, and lumbar spine negative for acute finding.  CT angiogram without  evidence of pulmonary embolism but right lower lobe infiltrate with effusion.  Patient received 500 mL of normal saline bolus and was admitted to the hospital for further evaluation and treatment.  Assessment and plan.  Severe sepsis secondary to COVID-19 pneumonia versus possible aspiration pneumonia with acute hypoxemic respiratory failure  CT angiogram after chest negative for PE but with right-sided infiltrate with small effusion.  Patient has completed course of remdesivir.  Continue dexamethasone.  BNP within normal limits.  Initial procalcitonin elevated at 2.4.   Airborne precautions.  Continue incentive spirometry.   Lactic acid was 0.8.  Patient is afebrile with no leukocytosis.  Patient is currently on room air and was initially on 3 L of oxygen.  Blood cultures showing Streptococcus in 1 bottle.  Likely contaminant.  Patient is already on Unasyn. Speech therapy  has seen the patient and patient is on dysphagia 2 diet.  Palliative care on board and patient is DNR.  Would benefit from palliative care as outpatient at the skilled nursing facility.  Hypokalemia.  Potassium of 3.7.  Improved after replacement   Normal pressure hydrocephalus ED physician spoke with Dr. Rory Percy neurology due to abnormal CT scan.  CT scan of the head is unchanged from previous and recommended outpatient neurosurgery follow-up.    Chronic thrombocytopenia No signs of bleeding.  Will continue to monitor.  Latest platelet count of 104.  Dementia  -Delirium precautions   Anxiety/BPH Stable at this time.  History of PSVT: Continue telemetry monitor.  GERD Not on medications.   Generalized weakness/physical deconditioning/fall risk Continue fall precautions.  At baseline, was able to do things by himself at home.  PT OT has recommended skilled nursing facility placement at this time.    DVT prophylaxis: enoxaparin (LOVENOX) injection 40 mg Start: 05/06/22 2200   Code Status:     Code Status: DNR  Disposition: Skilled nursing facility, awaiting for COVID isolation..  Status is: Inpatient  Remains inpatient appropriate because:  IV antibiotic, right lower lobe pneumonia, possible aspiration, COVID Pneumonia, need for rehabitation.   Family Communication:   Spoke with the patient's son on the phone and updated him about the clinical condition of the patient on 05/07/2022.  Consultants:  Palliative care  Procedures:  None  Antimicrobials:  Remdesivir- completed Unasyn 2/6>  Anti-infectives (From admission, onward)    Start     Dose/Rate Route Frequency Ordered Stop   05/07/22 1000  remdesivir 100 mg in sodium chloride 0.9 %  100 mL IVPB       See Hyperspace for full Linked Orders Report.   100 mg 200 mL/hr over 30 Minutes Intravenous Daily 05/06/22 2115 05/08/22 1230   05/06/22 2200  Ampicillin-Sulbactam (UNASYN) 3 g in sodium chloride 0.9 % 100 mL IVPB         3 g 200 mL/hr over 30 Minutes Intravenous Every 6 hours 05/06/22 2120     05/06/22 2115  remdesivir 200 mg in sodium chloride 0.9% 250 mL IVPB       See Hyperspace for full Linked Orders Report.   200 mg 580 mL/hr over 30 Minutes Intravenous Once 05/06/22 2115 05/06/22 2325   05/06/22 0000  nirmatrelvir & ritonavir (PAXLOVID, 300/100,) 20 x 150 MG & 10 x 100MG TBPK        3 tablet Oral 2 times daily 05/06/22 1827 05/11/22 2359      Subjective:  Today, patient was seen and examined at bedside.  Patient complains pain off and on and thinks like.  Denies any fever or chills enroute is still has some cough with productive sputum.   Objective: Vitals:   05/10/22 0635 05/10/22 2002 05/11/22 0327 05/11/22 1348  BP: 121/62 (!) 134/59 107/65 (!) 105/51  Pulse: 70 69 (!) 54 66  Resp: 18 (!) 21 18 18  $ Temp: 97.7 F (36.5 C) 98.4 F (36.9 C) 98.1 F (36.7 C) 98.2 F (36.8 C)  TempSrc: Oral Oral Oral Oral  SpO2: 95% 94% 93% 92%  Weight:      Height:        Intake/Output Summary (Last 24 hours) at 05/11/2022 1524 Last data filed at 05/11/2022 0329 Gross per 24 hour  Intake 310 ml  Output 300 ml  Net 10 ml    Filed Weights   05/06/22 1727  Weight: 55 kg    Physical Examination: Body mass index is 18.44 kg/m.   General: Alert awake and Communicative, chronically ill and deconditioned, thinly built.   HENT:   No scleral pallor or icterus noted. Oral mucosa is moist.  Chest:   Diminished breath sounds bilaterally.  Coarse breath sounds noted. CVS: S1 &S2 heard. No murmur.  Regular rate and rhythm. Abdomen: Soft, nontender, nondistended.  Bowel sounds are heard.   Extremities: No cyanosis, clubbing or edema.  Peripheral pulses are palpable. Psych: Alert, awake and oriented, normal mood CNS:  No cranial nerve deficits.  Generalized weakness noted.  Moves all extremities. Skin: Warm and dry.  No rashes noted.  Data Reviewed:   CBC: Recent Labs  Lab 05/06/22 0955  05/07/22 0021 05/08/22 0508 05/09/22 0351 05/10/22 0527  WBC 8.8 9.1 10.6* 8.1 9.0  NEUTROABS 7.4  --   --   --   --   HGB 13.8 13.9 14.8 13.9 12.8*  HCT 40.9 40.6 43.6 41.2 38.6*  MCV 94.9 95.5 95.0 96.0 96.5  PLT 103* 99* 113* 104* 104*     Basic Metabolic Panel: Recent Labs  Lab 05/06/22 0955 05/07/22 0021 05/08/22 0508 05/09/22 0351 05/10/22 0527  NA 135 136 138 140 140  K 3.5 3.3* 4.0 4.1 3.7  CL 101 100 104 106 106  CO2 23 22 24 25 26  $ GLUCOSE 104* 97 145* 160* 181*  BUN 23 21 34* 27* 29*  CREATININE 0.74 0.77 0.64 0.60* 0.59*  CALCIUM 8.5* 8.0* 8.2* 8.3* 8.1*  MG  --   --  2.1 2.1 2.0     Liver Function Tests: Recent Labs  Lab 05/07/22 0021  AST 121*  ALT 35  ALKPHOS 44  BILITOT 1.1  PROT 5.4*  ALBUMIN 3.2*      Radiology Studies: No results found.    LOS: 5 days    Flora Lipps, MD [facility] Available via Epic secure chat 7am-7pm After these hours, please refer to coverage provider listed on [email or web address] 05/11/2022, 3:24 PM

## 2022-05-11 NOTE — Progress Notes (Addendum)
Mobility Specialist - Progress Note   05/11/22 1009  Mobility  Activity Ambulated with assistance in hallway  Level of Assistance Contact guard assist, steadying assist  Assistive Device Front wheel walker  Distance Ambulated (ft) 480 ft  Activity Response Tolerated well  Mobility Referral Yes  $Mobility charge 1 Mobility   Pt received in recliner w/ nurse in room and agreeable to mobility. Pt is MinA from sit-to-stand & contact during ambulation. In middle of session pt c/o thighs  feeling tired but still eager to ambulate. No other complaints during session. Pt to recliner after session with all needs met 7 chair alarm turned back on.   [facility]

## 2022-05-12 DIAGNOSIS — D696 Thrombocytopenia, unspecified: Secondary | ICD-10-CM | POA: Diagnosis not present

## 2022-05-12 DIAGNOSIS — G912 (Idiopathic) normal pressure hydrocephalus: Secondary | ICD-10-CM | POA: Diagnosis not present

## 2022-05-12 DIAGNOSIS — J9601 Acute respiratory failure with hypoxia: Secondary | ICD-10-CM | POA: Diagnosis not present

## 2022-05-12 DIAGNOSIS — U071 COVID-19: Secondary | ICD-10-CM | POA: Diagnosis not present

## 2022-05-12 LAB — CULTURE, BLOOD (ROUTINE X 2)
Culture: NO GROWTH
Special Requests: ADEQUATE

## 2022-05-12 LAB — BASIC METABOLIC PANEL
Anion gap: 7 (ref 5–15)
BUN: 23 mg/dL (ref 8–23)
CO2: 25 mmol/L (ref 22–32)
Calcium: 7.6 mg/dL — ABNORMAL LOW (ref 8.9–10.3)
Chloride: 104 mmol/L (ref 98–111)
Creatinine, Ser: 0.65 mg/dL (ref 0.61–1.24)
GFR, Estimated: >60 mL/min (ref >60)
Glucose, Bld: 161 mg/dL — ABNORMAL HIGH (ref 70–99)
Potassium: 4.2 mmol/L (ref 3.5–5.1)
Sodium: 136 mmol/L (ref 135–145)

## 2022-05-12 LAB — CBC
HCT: 35.9 % — ABNORMAL LOW (ref 39.0–52.0)
Hemoglobin: 12.1 g/dL — ABNORMAL LOW (ref 13.0–17.0)
MCH: 32.5 pg (ref 26.0–34.0)
MCHC: 33.7 g/dL (ref 30.0–36.0)
MCV: 96.5 fL (ref 80.0–100.0)
Platelets: 140 10*3/uL — ABNORMAL LOW (ref 150–400)
RBC: 3.72 MIL/uL — ABNORMAL LOW (ref 4.22–5.81)
RDW: 12.8 % (ref 11.5–15.5)
WBC: 8 10*3/uL (ref 4.0–10.5)
nRBC: 0 % (ref 0.0–0.2)

## 2022-05-12 NOTE — Progress Notes (Signed)
PROGRESS NOTE    TRAIDEN COOMER  [id number] DOB: Mar 29, 1941 DOA: 05/06/2022 PCP: Prince Solian, MD    Brief Narrative:  EDDIE ILACQUA is a 82 y.o. male with medical history significant of dementia, anxiety, arthritis, osteoporosis, BPH, nephrolithiasis, PSVT, GERD, chronic thrombocytopenia presented to the ED for evaluation of generalized weakness, sore throat, and fever for 2 days with generalized weakness.  Patient also slipped out of his bed.  Of note, patient had been non ambulatory recently and had been having difficulty completing his ADLs.  Patient was referred to the ED by his primary care physician.  In the ED, patient was febrile with a temperature of 102.6.  Fahrenheit and tachycardic , pulse ox was 80% on room air.  Was placed on 3 L of oxygen with improvement.  Laboratory data showed no leukocytosis but mild thrombocytopenia at 103K, SARS-CoV-2 PCR positive.  CT head showing findings of normal pressure hydrocephalus.  CT of cervical, thoracic, and lumbar spine negative for acute finding.  CT angiogram without  evidence of pulmonary embolism but right lower lobe infiltrate with effusion.  Patient received 500 mL of normal saline bolus and was admitted to the hospital for further evaluation and treatment.  At this time, patient has been getting IV antibiotics and supportive care.  Has been considered for skilled nursing facility placement.  Assessment and plan.  Severe sepsis secondary to COVID-19 pneumonia versus possible aspiration pneumonia with acute hypoxemic respiratory failure  CT angiogram after chest negative for PE but with right-sided infiltrate with small effusion.  Patient has completed course of remdesivir.  Continue dexamethasone.  BNP within normal limits.  Initial procalcitonin elevated at 2.4.   Airborne precautions.  Continue incentive spirometry.   Lactic acid was 0.8.  Patient is afebrile with no leukocytosis.  Patient is currently on room air and was  initially on 3 L of oxygen.  Blood cultures showing Streptococcus in 1 bottle.  Likely contaminant.  Patient is already on Unasyn. Speech therapy has seen the patient and patient is on dysphagia 2 diet.  Palliative care on board and patient is DNR.  Would benefit from palliative care as outpatient at the skilled nursing facility.  Hypokalemia.  Resolved after replacement.  Latest potassium of 4.2.  Normal pressure hydrocephalus ED physician spoke with Dr. Rory Percy neurology due to abnormal CT scan.  CT scan of the head is unchanged from previous and recommended outpatient neurosurgery follow-up.    Chronic thrombocytopenia No signs of bleeding.  Will continue to monitor.  Latest platelet count of 140.  Dementia  -Continue delirium precautions   Anxiety/BPH Stable at this time.  History of PSVT: Continue telemetry monitor.  GERD Not on medications.   Generalized weakness/physical deconditioning/fall risk Continue fall precautions.  At baseline, patient was able to do things by himself at home.  PT OT has recommended skilled nursing facility placement at this time.    DVT prophylaxis: enoxaparin (LOVENOX) injection 40 mg Start: 05/06/22 2200   Code Status:     Code Status: DNR  Disposition:  Skilled nursing facility, awaiting for COVID isolation period prior to discharge.  Status is: Inpatient  Remains inpatient appropriate because:  IV antibiotic, right lower lobe pneumonia, possible aspiration, COVID Pneumonia, need for rehabitation.   Family Communication:   Spoke with the patient's son on the phone and updated him about the clinical condition of the patient on 05/07/2022.  Consultants:  Palliative care  Procedures:  None  Antimicrobials:  Remdesivir- completed Unasyn 2/6>  Anti-infectives (From admission, onward)    Start     Dose/Rate Route Frequency Ordered Stop   05/07/22 1000  remdesivir 100 mg in sodium chloride 0.9 % 100 mL IVPB       See Hyperspace for full  Linked Orders Report.   100 mg 200 mL/hr over 30 Minutes Intravenous Daily 05/06/22 2115 05/08/22 1230   05/06/22 2200  Ampicillin-Sulbactam (UNASYN) 3 g in sodium chloride 0.9 % 100 mL IVPB        3 g 200 mL/hr over 30 Minutes Intravenous Every 6 hours 05/06/22 2120     05/06/22 2115  remdesivir 200 mg in sodium chloride 0.9% 250 mL IVPB       See Hyperspace for full Linked Orders Report.   200 mg 580 mL/hr over 30 Minutes Intravenous Once 05/06/22 2115 05/06/22 2325   05/06/22 0000  nirmatrelvir & ritonavir (PAXLOVID, 300/100,) 20 x 150 MG & 10 x 100MG TBPK        3 tablet Oral 2 times daily 05/06/22 1827 05/11/22 2359      Subjective:  Today, patient was seen and examined at bedside.  Has some cough.  He states that he has been battling through swallowing problem and states that his food is not quite soft and chopped.  Objective: Vitals:   05/11/22 0327 05/11/22 1348 05/11/22 2025 05/12/22 0440  BP: 107/65 (!) 105/51 127/66 (!) 114/58  Pulse: (!) 54 66 64 67  Resp: 18 18 (!) 21 20  Temp: 98.1 F (36.7 C) 98.2 F (36.8 C) 97.6 F (36.4 C) 98.5 F (36.9 C)  TempSrc: Oral Oral Oral Oral  SpO2: 93% 92% 93% 95%  Weight:      Height:        Intake/Output Summary (Last 24 hours) at 05/12/2022 1313 Last data filed at 05/12/2022 1236 Gross per 24 hour  Intake 1899.8 ml  Output 1225 ml  Net 674.8 ml    Filed Weights   05/06/22 1727  Weight: 55 kg    Physical Examination: Body mass index is 18.44 kg/m.   General: Alert awake and Communicative, elderly male, appears chronically ill, thinly built HENT:   No scleral pallor or icterus noted. Oral mucosa is moist.  Chest: Coarse breath sounds noted bilaterally. CVS: S1 &S2 heard. No murmur.  Regular rate and rhythm. Abdomen: Soft, nontender, nondistended.  Bowel sounds are heard.   Extremities: No cyanosis, clubbing or edema.  Peripheral pulses are palpable. Psych: Alert, awake and oriented, normal mood CNS:  No cranial  nerve deficits.  Generalized weakness noted.   skin: Warm and dry.  No rashes noted.  Data Reviewed:   CBC: Recent Labs  Lab 05/06/22 0955 05/07/22 0021 05/08/22 0508 05/09/22 0351 05/10/22 0527 05/12/22 0359  WBC 8.8 9.1 10.6* 8.1 9.0 8.0  NEUTROABS 7.4  --   --   --   --   --   HGB 13.8 13.9 14.8 13.9 12.8* 12.1*  HCT 40.9 40.6 43.6 41.2 38.6* 35.9*  MCV 94.9 95.5 95.0 96.0 96.5 96.5  PLT 103* 99* 113* 104* 104* 140*     Basic Metabolic Panel: Recent Labs  Lab 05/07/22 0021 05/08/22 0508 05/09/22 0351 05/10/22 0527 05/12/22 0359  NA 136 138 140 140 136  K 3.3* 4.0 4.1 3.7 4.2  CL 100 104 106 106 104  CO2 22 24 25 26 25  $ GLUCOSE 97 145* 160* 181* 161*  BUN 21 34* 27* 29* 23  CREATININE 0.77 0.64 0.60* 0.59* 0.65  CALCIUM 8.0* 8.2* 8.3* 8.1* 7.6*  MG  --  2.1 2.1 2.0  --      Liver Function Tests: Recent Labs  Lab 05/07/22 0021  AST 121*  ALT 35  ALKPHOS 44  BILITOT 1.1  PROT 5.4*  ALBUMIN 3.2*      Radiology Studies: No results found.    LOS: 6 days    Flora Lipps, MD [facility] Available via Epic secure chat 7am-7pm After these hours, please refer to coverage provider listed on [email or web address] 05/12/2022, 1:13 PM

## 2022-05-12 NOTE — Progress Notes (Signed)
Occupational Therapy Treatment Patient Details Name: MUKHTAR ICE MRN: [id number] DOB: 1940/09/01 Today's Date: 05/12/2022   History of present illness Patient is a 82 year old male who presented with severe sepsis secondary to COVID 19 pneumonia, acute hypoxemic respiratory failure, hypokalemia.   PMH: GERD, PSVT, chronic thrombocytopenia, skin squamous cell carcinoma, dementia, anxiety, arthritis, osteoporosis, normal pressure hydrocephalus.   OT comments  Patient is making progress towards goals with no LOB with use of RW to transfer from recliner in room to bathroom and back. Patient was re educated on using incentive spirometer and emphasized importance. Patient verbalized understanding and demonstrated some understanding attempting to use device.  Patient continues to have poor safety awareness, decreased functional activity tolerance impacting participation in ADLs. Patient would continue to benefit from skilled OT services at this time while admitted and after d/c to address noted deficits in order to improve overall safety and independence in ADLs.     Recommendations for follow up therapy are one component of a multi-disciplinary discharge planning process, led by the attending physician.  Recommendations may be updated based on patient status, additional functional criteria and insurance authorization.    Follow Up Recommendations  Skilled nursing-short term rehab (<3 hours/day)     Assistance Recommended at Discharge Frequent or constant Supervision/Assistance  Patient can return home with the following  A little help with walking and/or transfers;A lot of help with bathing/dressing/bathroom;Assistance with cooking/housework;Direct supervision/assist for medications management;Assist for transportation;Help with stairs or ramp for entrance;Direct supervision/assist for financial management   Equipment Recommendations  Other (comment) (defer to next venue)       Precautions /  Restrictions Precautions Precautions: Fall Precaution Comments: monitor O2 Restrictions Weight Bearing Restrictions: No       Mobility Bed Mobility       General bed mobility comments: patient was up in recliner and returned to the same          ADL either performed or assessed with clinical judgement   ADL Overall ADL's : Needs assistance/impaired     Grooming: Standing;Set up;Oral care Grooming Details (indicate cue type and reason): patient declined to use toothpaste.                 Toilet Transfer: Minimal assistance;Ambulation;Rolling walker (2 wheels) Toilet Transfer Details (indicate cue type and reason): with cues for leaning nose over toes, pushing up v.s. pulling on walker.                  Cognition Arousal/Alertness: Awake/alert Behavior During Therapy: WFL for tasks assessed/performed Overall Cognitive Status: Difficult to assess           General Comments: patient has a history of dementia. patient noed to need cues for redirection during session but cooperartive overall. poor memory asking for razor after already going through bags in room with therapist as per patient request.        Exercises Other Exercises Other Exercises: patient completed sit to stands 5 reps with 2 sets with SOB noted. O2 was 96% on RA. cues needed for proper hand position about 50% of time. patient noted to have lag in sit to stand process when cues were first introduced.            Pertinent Vitals/ Pain       Pain Assessment Pain Assessment: No/denies pain         Frequency  Min 2X/week        Progress Toward Goals  OT Goals(current goals  can now be found in the care plan section)  Progress towards OT goals: Progressing toward goals     Plan Discharge plan remains appropriate       AM-PAC OT "6 Clicks" Daily Activity     Outcome Measure   Help from another person eating meals?: A Little Help from another person taking care of  personal grooming?: A Little Help from another person toileting, which includes using toliet, bedpan, or urinal?: A Lot Help from another person bathing (including washing, rinsing, drying)?: A Lot Help from another person to put on and taking off regular upper body clothing?: A Little Help from another person to put on and taking off regular lower body clothing?: A Lot 6 Click Score: 15    End of Session Equipment Utilized During Treatment: Gait belt;Rolling walker (2 wheels)  OT Visit Diagnosis: Unsteadiness on feet (R26.81);Other abnormalities of gait and mobility (R26.89)   Activity Tolerance Patient tolerated treatment well   Patient Left in chair;with call bell/phone within reach;with chair alarm set   Nurse Communication Other (comment) (ok to participate in session)        Time: [id number] OT Time Calculation (min): 31 min  Charges: OT General Charges $OT Visit: 1 Visit OT Treatments $Self Care/Home Management : 23-37 mins  Rennie Plowman, MS Acute Rehabilitation Department Office# [phone]   Willa Rough 05/12/2022, 4:04 PM

## 2022-05-12 NOTE — Progress Notes (Signed)
Physical Therapy Treatment Patient Details Name: Lee Nelson MRN: [id number] DOB: 14-Jan-1941 Today's Date: 05/12/2022   History of Present Illness Patient is a 82 year old male who presented with severe sepsis secondary to COVID 19 pneumonia, acute hypoxemic respiratory failure, hypokalemia.   PMH: GERD, PSVT, chronic thrombocytopenia, skin squamous cell carcinoma, dementia, anxiety, arthritis, osteoporosis, normal pressure hydrocephalus.    PT Comments    Pt assisted with ambulating in hallway and then to bathroom. Pt left in recliner with lunch.    Recommendations for follow up therapy are one component of a multi-disciplinary discharge planning process, led by the attending physician.  Recommendations may be updated based on patient status, additional functional criteria and insurance authorization.  Follow Up Recommendations  Skilled nursing-short term rehab (<3 hours/day) Can patient physically be transported by private vehicle: No   Assistance Recommended at Discharge Frequent or constant Supervision/Assistance  Patient can return home with the following A lot of help with bathing/dressing/bathroom;Assistance with cooking/housework;Assist for transportation;Help with stairs or ramp for entrance;A lot of help with walking and/or transfers   Equipment Recommendations  None recommended by PT    Recommendations for Other Services       Precautions / Restrictions Precautions Precautions: Fall Precaution Comments: monitor O2     Mobility  Bed Mobility               General bed mobility comments: pt in recliner    Transfers Overall transfer level: Needs assistance Equipment used: Rolling walker (2 wheels) Transfers: Sit to/from Stand Sit to Stand: Min assist           General transfer comment: verbal cues for hand placement, assist to rise from toilet surface    Ambulation/Gait Ambulation/Gait assistance: Min guard Gait Distance (Feet): 400  Feet Assistive device: Rolling walker (2 wheels) Gait Pattern/deviations: Step-through pattern, Decreased stride length, Narrow base of support, Shuffle Gait velocity: decr     General Gait Details: pt wearing his shoes from home today, cues for "big steps" since pt tends to shuffle small steps   Stairs             Wheelchair Mobility    Modified Rankin (Stroke Patients Only)       Balance Overall balance assessment: Needs assistance         Standing balance support: Bilateral upper extremity supported, During functional activity, Reliant on assistive device for balance Standing balance-Leahy Scale: Poor                              Cognition Arousal/Alertness: Awake/alert Behavior During Therapy: WFL for tasks assessed/performed Overall Cognitive Status: Difficult to assess                                 General Comments: HOH, hx dementia        Exercises      General Comments        Pertinent Vitals/Pain Pain Assessment Pain Assessment: No/denies pain Pain Intervention(s): Monitored during session, Repositioned    Home Living                          Prior Function            PT Goals (current goals can now be found in the care plan section) Progress towards PT goals: Progressing toward goals  Frequency    Min 2X/week      PT Plan Current plan remains appropriate    Co-evaluation              AM-PAC PT "6 Clicks" Mobility   Outcome Measure  Help needed turning from your back to your side while in a flat bed without using bedrails?: A Little Help needed moving from lying on your back to sitting on the side of a flat bed without using bedrails?: A Little Help needed moving to and from a bed to a chair (including a wheelchair)?: A Little Help needed standing up from a chair using your arms (e.g., wheelchair or bedside chair)?: A Little Help needed to walk in hospital room?: A Little Help  needed climbing 3-5 steps with a railing? : A Lot 6 Click Score: 17    End of Session Equipment Utilized During Treatment: Gait belt Activity Tolerance: Patient tolerated treatment well Patient left: in chair;with call bell/phone within reach;with chair alarm set Nurse Communication: Mobility status PT Visit Diagnosis: Unsteadiness on feet (R26.81);Difficulty in walking, not elsewhere classified (R26.2)     Time: [id number] PT Time Calculation (min) (ACUTE ONLY): 28 min  Charges:  $Gait Training: 23-37 mins                    Jannette Spanner PT, DPT Physical Therapist Acute Rehabilitation Services Preferred contact method: Secure Chat Weekend Pager Only: [phone] Office: [place] 05/12/2022, 2:54 PM

## 2022-05-13 DIAGNOSIS — F0390 Unspecified dementia without behavioral disturbance: Secondary | ICD-10-CM | POA: Diagnosis not present

## 2022-05-13 DIAGNOSIS — D696 Thrombocytopenia, unspecified: Secondary | ICD-10-CM | POA: Diagnosis not present

## 2022-05-13 DIAGNOSIS — U071 COVID-19: Secondary | ICD-10-CM | POA: Diagnosis not present

## 2022-05-13 DIAGNOSIS — G912 (Idiopathic) normal pressure hydrocephalus: Secondary | ICD-10-CM | POA: Diagnosis not present

## 2022-05-13 NOTE — Progress Notes (Signed)
Mobility Specialist - Progress Note   05/13/22 1438  Mobility  Activity Ambulated with assistance in hallway  Level of Assistance Contact guard assist, steadying assist  Assistive Device Front wheel walker  Distance Ambulated (ft) 550 ft  Range of Motion/Exercises Active  Activity Response Tolerated well  Mobility Referral Yes  $Mobility charge 1 Mobility   Pt was found in bathroom with RN and agreeable to ambulate. Remains unsteady when going from sitting>standing and while ambulating. At EOS returned to recliner chair with all necessities in reach and chair alarm on. RN notified.  Ferd Hibbs Mobility Specialist

## 2022-05-13 NOTE — Progress Notes (Addendum)
PROGRESS NOTE    Lee Nelson  [id number] DOB: [birth year] DOA: 05/06/2022 PCP: Prince Solian, MD    Brief Narrative:  Lee Nelson is a 82 y.o. male with medical history significant of dementia, anxiety, arthritis, osteoporosis, BPH, nephrolithiasis, PSVT, GERD, chronic thrombocytopenia presented to the ED for evaluation of generalized weakness, sore throat, and fever for 2 days with generalized weakness.  Patient also slipped out of his bed.  Of note, patient had been non ambulatory recently and had been having difficulty completing his ADLs.  Patient was referred to the ED by his primary care physician.  In the ED, patient was febrile with a temperature of 102.6.  Fahrenheit and tachycardic , pulse ox was 80% on room air.  Was placed on 3 L of oxygen with improvement.  Laboratory data showed no leukocytosis but mild thrombocytopenia at 103K, SARS-CoV-2 PCR positive.  CT head showing findings of normal pressure hydrocephalus.  CT of cervical, thoracic, and lumbar spine negative for acute finding.  CT angiogram without  evidence of pulmonary embolism but right lower lobe infiltrate with effusion.  Patient received 500 mL of normal saline bolus and was admitted to the hospital for further evaluation and treatment.  During hospitalization, patient received course of remdesivir and Zosyn.  Speech therapy saw the patient and he is on modified diet.  Has remained stable but appears weak and deconditioned and is awaiting for skilled nursing facility placement.  At this time patient is medically stable for disposition.  Assessment and plan.  Severe sepsis secondary to COVID-19 pneumonia versus possible aspiration pneumonia with acute hypoxemic respiratory failure  CT angiogram after chest negative for PE but with right-sided infiltrate with small effusion.  Patient has completed course of remdesivir and zosyn. Received dexamethasone for 7 days.  Discontinue dexamethasone at this time.  BNP  within normal limits.  Initial procalcitonin elevated at 2.4.   Airborne precautions.  Continue incentive spirometry.     Patient is afebrile with no leukocytosis.  Patient is currently on room air and was initially on 3 L of oxygen.  Blood cultures showing Streptococcus in 1 bottle.  Likely contaminant.  Speech therapy has seen the patient and patient is on dysphagia 3 diet.  Palliative care on board and patient is DNR.  Would benefit from palliative care as outpatient at the skilled nursing facility.  Patient has clinically improved at this time.  Will need to continue modified diet on discharge.  Hypokalemia.  Potassium of 3.7.  Improved after replacement   Normal pressure hydrocephalus ED physician spoke with Dr. Rory Percy neurology due to abnormal CT scan.  CT scan of the head is unchanged from previous and recommended outpatient neurosurgery follow-up.  No other focal neurological deficits reported.  Chronic thrombocytopenia No signs of bleeding.  Will continue to monitor.  Latest platelet count of 104.  Dementia  -Delirium precautions.     Anxiety/BPH Stable at this time.  History of PSVT: Stable at this time.  GERD Not on medications.   Generalized weakness/physical deconditioning/fall risk Continue fall precautions.  At baseline, was able to do things by himself at home.  PT OT has recommended skilled nursing facility placement at this time.    DVT prophylaxis: enoxaparin (LOVENOX) injection 40 mg Start: 05/06/22 2200   Code Status:     Code Status: DNR  Disposition: Skilled nursing facility, awaiting for COVID isolation.  Medically stable for disposition to skilled nursing facility.  Status is: Inpatient  Remains inpatient appropriate because:  IV antibiotic, right lower lobe pneumonia, possible aspiration, COVID Pneumonia, need for rehabitation.   Family Communication:   I tried to reach the patient's son but was unable to reach him today.  Consultants:  Palliative  care  Procedures:  None  Antimicrobials:  Remdesivir- completed Unasyn 2/6>2/13  Anti-infectives (From admission, onward)    Start     Dose/Rate Route Frequency Ordered Stop   05/07/22 1000  remdesivir 100 mg in sodium chloride 0.9 % 100 mL IVPB       See Hyperspace for full Linked Orders Report.   100 mg 200 mL/hr over 30 Minutes Intravenous Daily 05/06/22 2115 05/08/22 1230   05/06/22 2200  Ampicillin-Sulbactam (UNASYN) 3 g in sodium chloride 0.9 % 100 mL IVPB  Status:  Discontinued        3 g 200 mL/hr over 30 Minutes Intravenous Every 6 hours 05/06/22 2120 05/13/22 1220   05/06/22 2115  remdesivir 200 mg in sodium chloride 0.9% 250 mL IVPB       See Hyperspace for full Linked Orders Report.   200 mg 580 mL/hr over 30 Minutes Intravenous Once 05/06/22 2115 05/06/22 2325   05/06/22 0000  nirmatrelvir & ritonavir (PAXLOVID, 300/100,) 20 x 150 MG & 10 x 100MG TBPK        3 tablet Oral 2 times daily 05/06/22 1827 05/11/22 2359      Subjective:  Today, patient was seen and examined at bedside.  No interval complaints today.  Has mild cough but no chest pain fever or chills.   Objective: Vitals:   05/12/22 1330 05/12/22 1951 05/12/22 2123 05/13/22 0410  BP: 132/62 123/65 103/69 (!) 120/54  Pulse: 76 71 82 66  Resp: 18 16 19 20  $ Temp: 98.6 F (37 C) (!) 97.5 F (36.4 C) 98.7 F (37.1 C) 97.8 F (36.6 C)  TempSrc: Oral Oral Oral Oral  SpO2: 96% 92%  95%  Weight:      Height:        Intake/Output Summary (Last 24 hours) at 05/13/2022 1237 Last data filed at 05/13/2022 1139 Gross per 24 hour  Intake 524.41 ml  Output 1900 ml  Net -1375.59 ml    Filed Weights   05/06/22 1727  Weight: 55 kg    Physical Examination: Body mass index is 18.44 kg/m.   General: Alert awake and Communicative, chronically ill and thinly built.   HENT:   No scleral pallor or icterus noted. Oral mucosa is moist.  Chest:   Diminished breath sounds bilaterally.   CVS: S1 &S2 heard. No  murmur.  Regular rate and rhythm. Abdomen: Soft, nontender, nondistended.  Bowel sounds are heard.   Extremities: No cyanosis, clubbing or edema.   Psych: Alert, awake and oriented, normal mood, Communicative CNS:  No cranial nerve deficits.  Moves all extremities. Skin: Warm and dry.  No rashes noted.  Data Reviewed:   CBC: Recent Labs  Lab 05/07/22 0021 05/08/22 0508 05/09/22 0351 05/10/22 0527 05/12/22 0359  WBC 9.1 10.6* 8.1 9.0 8.0  HGB 13.9 14.8 13.9 12.8* 12.1*  HCT 40.6 43.6 41.2 38.6* 35.9*  MCV 95.5 95.0 96.0 96.5 96.5  PLT 99* 113* 104* 104* 140*     Basic Metabolic Panel: Recent Labs  Lab 05/07/22 0021 05/08/22 0508 05/09/22 0351 05/10/22 0527 05/12/22 0359  NA 136 138 140 140 136  K 3.3* 4.0 4.1 3.7 4.2  CL 100 104 106 106 104  CO2 22 24 25 26 25  $ GLUCOSE 97 145*  160* 181* 161*  BUN 21 34* 27* 29* 23  CREATININE 0.77 0.64 0.60* 0.59* 0.65  CALCIUM 8.0* 8.2* 8.3* 8.1* 7.6*  MG  --  2.1 2.1 2.0  --      Liver Function Tests: Recent Labs  Lab 05/07/22 0021  AST 121*  ALT 35  ALKPHOS 44  BILITOT 1.1  PROT 5.4*  ALBUMIN 3.2*      Radiology Studies: No results found.    LOS: 7 days    Flora Lipps, MD [facility] Available via Epic secure chat 7am-7pm After these hours, please refer to coverage provider listed on [email or web address] 05/13/2022, 12:37 PM

## 2022-05-14 ENCOUNTER — Encounter: Payer: Medicare Other | Admitting: Rehabilitative and Restorative Service Providers"

## 2022-05-14 DIAGNOSIS — J1282 Pneumonia due to coronavirus disease 2019: Secondary | ICD-10-CM | POA: Diagnosis not present

## 2022-05-14 DIAGNOSIS — U071 COVID-19: Secondary | ICD-10-CM | POA: Diagnosis not present

## 2022-05-14 LAB — GLUCOSE, CAPILLARY: Glucose-Capillary: 153 mg/dL — ABNORMAL HIGH (ref 70–99)

## 2022-05-14 MED ORDER — ORAL CARE MOUTH RINSE: 15.0000 mL | OROMUCOSAL | Status: DC | PRN

## 2022-05-14 MED ORDER — BOOST / RESOURCE BREEZE PO LIQD CUSTOM
1.0000 | Freq: Three times a day (TID) | ORAL | Status: DC
  Administered 2022-05-14 (×2): 1 via ORAL

## 2022-05-14 MED ORDER — ORAL CARE MOUTH RINSE
15.0000 mL | OROMUCOSAL | Status: DC
  Administered 2022-05-14 – 2022-05-15 (×4): 15 mL via OROMUCOSAL

## 2022-05-14 MED ORDER — ADULT MULTIVITAMIN W/MINERALS CH
1.0000 | ORAL_TABLET | Freq: Every day | ORAL | Status: DC
  Administered 2022-05-15: 1 via ORAL
  Filled 2022-05-14: qty 1

## 2022-05-14 NOTE — Progress Notes (Signed)
Occupational Therapy Treatment Patient Details Name: Lee Nelson MRN: [id number] DOB: [birth year] Today's Date: 05/14/2022   History of present illness Patient is a 82 year old male who presented with severe sepsis secondary to COVID 19 pneumonia, acute hypoxemic respiratory failure, hypokalemia.   PMH: GERD, PSVT, chronic thrombocytopenia, skin squamous cell carcinoma, dementia, anxiety, arthritis, osteoporosis, normal pressure hydrocephalus.   OT comments  Patient was able to participate in therapeutic activity during session to increase independence in ADLs.patient is making progress towards goals.  Patient noted to have pain in R hip near groin area with flexion of hip. Patient reported he had an old injury in this area.it did not effect patients ability to participate in tasks.  Nurse made aware. Patient's discharge plan remains appropriate at this time. OT will continue to follow acutely.     Recommendations for follow up therapy are one component of a multi-disciplinary discharge planning process, led by the attending physician.  Recommendations may be updated based on patient status, additional functional criteria and insurance authorization.    Follow Up Recommendations  Skilled nursing-short term rehab (<3 hours/day)     Assistance Recommended at Discharge Frequent or constant Supervision/Assistance  Patient can return home with the following  A little help with walking and/or transfers;A lot of help with bathing/dressing/bathroom;Assistance with cooking/housework;Direct supervision/assist for medications management;Assist for transportation;Help with stairs or ramp for entrance;Direct supervision/assist for financial management   Equipment Recommendations  Other (comment) (defer to next venue)       Precautions / Restrictions Precautions Precautions: Fall Precaution Comments: monitor O2 Restrictions Weight Bearing Restrictions: No              ADL either performed  or assessed with clinical judgement   ADL Overall ADL's : Needs assistance/impaired       Toilet Transfer: Min guard;Ambulation;Rolling walker (2 wheels) Toilet Transfer Details (indicate cue type and reason): patient was able to ambulate around room with distant supervision with RW.   Toileting - Clothing Manipulation Details (indicate cue type and reason): patient inquired about using urinal in recliner with patient reporting increased difficulty with getting urine to not flow back on him. patient was educated on using urinal and tilting opening upright as he removed himself.              Cognition Arousal/Alertness: Awake/alert Behavior During Therapy: WFL for tasks assessed/performed Overall Cognitive Status: Difficult to assess            Exercises Other Exercises Other Exercises: patient participated in sit to stands 10 reps from recliner and chair in room which are at different heights. patient needed cues for proper placement of hands with inconsistences Other Exercises: patient participated in elbow and shoulder exercises on all three planes with yellow theraband 15 reps each.            Pertinent Vitals/ Pain       Pain Assessment Pain Assessment: No/denies pain         Frequency  Min 2X/week        Progress Toward Goals  OT Goals(current goals can now be found in the care plan section)  Progress towards OT goals: Progressing toward goals     Plan Discharge plan remains appropriate       AM-PAC OT "6 Clicks" Daily Activity     Outcome Measure   Help from another person eating meals?: A Little Help from another person taking care of personal grooming?: A Little Help from another person toileting, which  includes using toliet, bedpan, or urinal?: A Lot Help from another person bathing (including washing, rinsing, drying)?: A Lot Help from another person to put on and taking off regular upper body clothing?: A Little Help from another person to  put on and taking off regular lower body clothing?: A Lot 6 Click Score: 15    End of Session Equipment Utilized During Treatment: Gait belt  OT Visit Diagnosis: Unsteadiness on feet (R26.81);Other abnormalities of gait and mobility (R26.89)   Activity Tolerance Patient tolerated treatment well   Patient Left in chair;with call bell/phone within reach;with chair alarm set   Nurse Communication Other (comment) (ok to participate)        Time: [id number] OT Time Calculation (min): 41 min  Charges: OT General Charges $OT Visit: 1 Visit OT Treatments $Self Care/Home Management : 8-22 mins $Therapeutic Activity: 8-22 mins $Therapeutic Exercise: 8-22 mins  Rennie Plowman, MS Acute Rehabilitation Department Office# [phone]   Willa Rough 05/14/2022, 4:10 PM

## 2022-05-14 NOTE — Care Management Important Message (Signed)
Important Message  Patient Details IM Letter given. Name: Lee Nelson MRN: [id number] Date of Birth: [birth year]   Medicare Important Message Given:  Yes     Kerin Salen 05/14/2022, 1:18 PM

## 2022-05-14 NOTE — TOC Progression Note (Signed)
Transition of Care [facility]) - Progression Note    Patient Details  Name: Lee Nelson MRN: [id number] Date of Birth: 02-Sep-1940  [facility]) CM/SW Contact  Jaxxen Voong, Juliann Pulse, RN Phone Number: 05/14/2022, 1:07 PM  Clinical Narrative: Per [facility] rep Jayliani Wanner-if fever free can accept tomorrow private rm early d/c. MD updated.      Expected Discharge Plan: [place] Barriers to Discharge: Continued Medical Work up  Expected Discharge Plan and Services   Discharge Planning Services: CM Consult   Living arrangements for the past 2 months: Single Family Home                                       Social Determinants of Health (SDOH) Interventions SDOH Screenings   Food Insecurity: No Food Insecurity (05/06/2022)  Housing: Low Risk  (05/06/2022)  Transportation Needs: No Transportation Needs (05/06/2022)  Utilities: Not At Risk (05/06/2022)  Tobacco Use: Low Risk  (05/06/2022)    Readmission Risk Interventions     No data to display

## 2022-05-14 NOTE — Progress Notes (Addendum)
Initial Nutrition Assessment  DOCUMENTATION CODES:   Underweight  INTERVENTION:   Boost Breeze po TID, each supplement provides 250 kcal and 9 grams of protein  Magic cup TID with meals, each supplement provides 290 kcal and 9 grams of protein  MVI po daily   Pt at high refeed risk; recommend monitor potassium, magnesium and phosphorus labs daily until stable  NUTRITION DIAGNOSIS:   Inadequate oral intake related to acute illness as evidenced by meal completion < 50%.  GOAL:   Patient will meet greater than or equal to 90% of their needs  MONITOR:   PO intake, Supplement acceptance, Labs, Weight trends, I & O's, Skin  REASON FOR ASSESSMENT:   Malnutrition Screening Tool    ASSESSMENT:   82 y/o male with h/o GERD, dementia, SBO (medically managed), anxiety, BPH, kidney stones and nissen fundoplication ([id number]) who is admitted with COVID 19 PNA.  RD working remotely.  RD unable to reach pt by phone. RD suspects pt with poor appetite and oral intake at baseline as pt is underweight. Pt is documented to be eating <50% of meals in hospital. Pt is able to feed himself. RD will add supplements to help pt meet his estimated needs. Will add Boost Breeze as pt with a soy allergy noted. Pt is at high refeed risk. Per chart, pt appears to be down 7lbs(6%) since admission if his bed weights are correct; this is significant weight loss. Pt is at high risk for malnutrition. RD will obtain nutrition related history and exam at follow up.   Medications reviewed and include: risaquad, vitamin C, D3, colace, lovenox, melatonin, zinc  Labs reviewed: K 4.2 wnl- 2/12  NUTRITION - FOCUSED PHYSICAL EXAM: Unable to perform at this time   Diet Order:   Diet Order             DIET DYS 3 Room service appropriate? No; Fluid consistency: Nectar Thick  Diet effective now                  EDUCATION NEEDS:   No education needs have been identified at this time  Skin:  Skin Assessment:  Reviewed RN Assessment  Last BM:  2/14- type 2  Height:   Ht Readings from Last 1 Encounters:  05/06/22 5' 8"$  (1.727 m)    Weight:   Wt Readings from Last 1 Encounters:  05/14/22 51.5 kg    Ideal Body Weight:  70 kg  BMI:  Body mass index is 17.26 kg/m.  Estimated Nutritional Needs:   Kcal:  1700-1900kcal/day  Protein:  80-90g/day  Fluid:  1.4-1.6L/day  Koleen Distance MS, RD, LDN Please refer to [facility] for RD and/or RD on-call/weekend/after hours pager

## 2022-05-14 NOTE — Progress Notes (Signed)
PROGRESS NOTE   Lee Nelson  [id number] DOB: 10-06-1940 DOA: 05/06/2022 PCP: Prince Solian, MD  Brief Narrative:  82 year old white male known chronic constipation followed by Dr. Paulita Fujita BPH with LUTS Multiple abdominal surgeries in the past with small bowel obstructions previously in 2021 Severe malnutrition BMI of 17 PSVT Reflux  Patient was found to be have slipped out of his bed 05/06/2022  febrile in the ED with Tmax 102.6 O2 sats in the 80s on 3 L platelets 100 COVID was positive CT chest showed no PE CT workup of head showed normal pressure hydrocephalus  He was admitted with working diagnosis of severe sepsis secondary to COVID 19 pneumonia plus aspiration pneumonia and was resuscitated appropriately  2/9 palliative care consulted  Hospital-Problem based course  Severe sepsis secondary to COVID-19 Possible aspiration pneumonia Acute hypoxic respiratory failure on admission-initially on 3 L nasal on room air - CT chest right-sided infiltrate status post remdesivir X 3 days 2/8- - Completed Unasyn 7 days on 05/13/2022 - Contaminant of Streptococcus in 1 bottle  Possible aspiration - Speech therapy started him on dysphagia 3 diet - Liberalize diet as best possible but  Hypokalemia on admission - Resolved-no need for replacement currently  Normal pressure hydrocephalus - Dr. Malen Gauze neurology discussed with EDP, recommendation was made for outpatient neurosurgery follow-up - I suspect this is chronic  AKI on admission - Was on IV fluids initially which have been discontinued  History of PSVT - Telemetry reviewed and benign in sinus rhythm at this time but discontinue in the morning  Chronic thrombocytopenia - His acute drop in platelets has resolved to some degree - He has not had labs in 2 days so we will repeat  DVT prophylaxis: Lovenox Code Status: Full Family Communication: None Disposition:  Status is: Inpatient Remains inpatient appropriate  because:   Awaiting bed placement offers likely can discharge a.m. for 2/15   Subjective: Pleasant sitting up in the chair-needed to assist No chest pain Does not like the food Mild cough No fever-no other complaints  Objective: Vitals:   05/13/22 2010 05/14/22 0414 05/14/22 0535 05/14/22 1217  BP: 119/60 (!) 109/49  120/66  Pulse: 80 63  90  Resp: 20 18  16  $ Temp: 98.6 F (37 C) 98.9 F (37.2 C)  98.6 F (37 C)  TempSrc: Oral Oral  Oral  SpO2: 96% 96%  100%  Weight:   51.5 kg   Height:        Intake/Output Summary (Last 24 hours) at 05/14/2022 1723 Last data filed at 05/14/2022 1723 Gross per 24 hour  Intake 600 ml  Output 2220 ml  Net -1620 ml   Filed Weights   05/06/22 1727 05/14/22 0535  Weight: 55 kg 51.5 kg    Examination:  EOMI NCAT no focal deficit no icterus no pallor quite frail cachectic appearing white male looking about stated age Chest is clear with no wheeze rales rhonchi S1-S2 no murmur sinus rhythm Abdomen soft no rebound no guarding No lower extremity edema  Data Reviewed: personally reviewed   CBC    Component Value Date/Time   WBC 8.0 05/12/2022 0359   RBC 3.72 (L) 05/12/2022 0359   HGB 12.1 (L) 05/12/2022 0359   HCT 35.9 (L) 05/12/2022 0359   PLT 140 (L) 05/12/2022 0359   MCV 96.5 05/12/2022 0359   MCH 32.5 05/12/2022 0359   MCHC 33.7 05/12/2022 0359   RDW 12.8 05/12/2022 0359   LYMPHSABS 0.4 (L) 05/06/2022 [id number]  MONOABS 1.0 05/06/2022 0955   EOSABS 0.0 05/06/2022 0955   BASOSABS 0.0 05/06/2022 0955      Latest Ref Rng & Units 05/12/2022    3:59 AM 05/10/2022    5:27 AM 05/09/2022    3:51 AM  CMP  Glucose 70 - 99 mg/dL 161  181  160   BUN 8 - 23 mg/dL 23  29  27   $ Creatinine 0.61 - 1.24 mg/dL 0.65  0.59  0.60   Sodium 135 - 145 mmol/L 136  140  140   Potassium 3.5 - 5.1 mmol/L 4.2  3.7  4.1   Chloride 98 - 111 mmol/L 104  106  106   CO2 22 - 32 mmol/L 25  26  25   $ Calcium 8.9 - 10.3 mg/dL 7.6  8.1  8.3      Radiology  Studies: No results found.   Scheduled Meds:  acidophilus  1 capsule Oral Daily   vitamin C  250 mg Oral Daily   cholecalciferol  2,000 Units Oral Daily   docusate sodium  50 mg Oral QHS   enoxaparin (LOVENOX) injection  40 mg Subcutaneous Q24H   feeding supplement  1 Container Oral TID BM   melatonin  3 mg Oral QHS   [START ON 05/15/2022] multivitamin with minerals  1 tablet Oral Daily   mouth rinse  15 mL Mouth Rinse 4 times per day   zinc sulfate  220 mg Oral Daily   Continuous Infusions:   LOS: 8 days   Time spent: [place], MD [facility] To contact the attending provider between 7A-7P or the covering provider during after hours 7P-7A, please log into the web site [email or web address] and access using universal [place] password for that web site. If you do not have the password, please call the hospital operator.  05/14/2022, 5:23 PM

## 2022-05-15 DIAGNOSIS — J1282 Pneumonia due to coronavirus disease 2019: Secondary | ICD-10-CM | POA: Diagnosis not present

## 2022-05-15 DIAGNOSIS — U071 COVID-19: Secondary | ICD-10-CM | POA: Diagnosis not present

## 2022-05-15 LAB — CBC WITH DIFFERENTIAL/PLATELET
Abs Immature Granulocytes: 0.06 10*3/uL (ref 0.00–0.07)
Basophils Absolute: 0 10*3/uL (ref 0.0–0.1)
Basophils Relative: 0 %
Eosinophils Absolute: 0.2 10*3/uL (ref 0.0–0.5)
Eosinophils Relative: 2 %
HCT: 37.4 % — ABNORMAL LOW (ref 39.0–52.0)
Hemoglobin: 12.3 g/dL — ABNORMAL LOW (ref 13.0–17.0)
Immature Granulocytes: 1 %
Lymphocytes Relative: 11 %
Lymphs Abs: 0.9 10*3/uL (ref 0.7–4.0)
MCH: 31.5 pg (ref 26.0–34.0)
MCHC: 32.9 g/dL (ref 30.0–36.0)
MCV: 95.7 fL (ref 80.0–100.0)
Monocytes Absolute: 0.6 10*3/uL (ref 0.1–1.0)
Monocytes Relative: 8 %
Neutro Abs: 6.4 10*3/uL (ref 1.7–7.7)
Neutrophils Relative %: 78 %
Platelets: 177 10*3/uL (ref 150–400)
RBC: 3.91 MIL/uL — ABNORMAL LOW (ref 4.22–5.81)
RDW: 13.1 % (ref 11.5–15.5)
WBC: 8.2 10*3/uL (ref 4.0–10.5)
nRBC: 0 % (ref 0.0–0.2)

## 2022-05-15 LAB — BASIC METABOLIC PANEL
Anion gap: 9 (ref 5–15)
BUN: 17 mg/dL (ref 8–23)
CO2: 25 mmol/L (ref 22–32)
Calcium: 8.2 mg/dL — ABNORMAL LOW (ref 8.9–10.3)
Chloride: 103 mmol/L (ref 98–111)
Creatinine, Ser: 0.6 mg/dL — ABNORMAL LOW (ref 0.61–1.24)
GFR, Estimated: >60 mL/min (ref >60)
Glucose, Bld: 97 mg/dL (ref 70–99)
Potassium: 3.9 mmol/L (ref 3.5–5.1)
Sodium: 137 mmol/L (ref 135–145)

## 2022-05-15 MED ORDER — ZINC SULFATE 220 (50 ZN) MG PO CAPS: 220.0000 mg | ORAL_CAPSULE | Freq: Every day | ORAL | Status: AC

## 2022-05-15 MED ORDER — SENNOSIDES-DOCUSATE SODIUM 8.6-50 MG PO TABS: 1.0000 | ORAL_TABLET | Freq: Every day | ORAL | Status: DC

## 2022-05-15 MED ORDER — SENNOSIDES-DOCUSATE SODIUM 8.6-50 MG PO TABS: 1.0000 | ORAL_TABLET | Freq: Every day | ORAL | Status: AC

## 2022-05-15 MED ORDER — MELATONIN 3 MG PO TABS: 3.0000 mg | ORAL_TABLET | Freq: Every day | ORAL | 0 refills | Status: AC

## 2022-05-15 NOTE — Progress Notes (Signed)
Report called and given to nurse at [facility]. Patient's spouse also just called and notified of patient's discharge.

## 2022-05-15 NOTE — Progress Notes (Signed)
Attempted to call report to receiving SNF. Left name and number, awaiting call back.

## 2022-05-15 NOTE — Plan of Care (Signed)
  Problem: Education: Goal: Knowledge of risk factors and measures for prevention of condition will improve Outcome: Progressing   Problem: Coping: Goal: Psychosocial and spiritual needs will be supported Outcome: Progressing   Problem: Respiratory: Goal: Will maintain a patent airway Outcome: Progressing   Problem: Education: Goal: Knowledge of General Education information will improve Description: Including pain rating scale, medication(s)/side effects and non-pharmacologic comfort measures Outcome: Progressing   Problem: Activity: Goal: Risk for activity intolerance will decrease Outcome: Progressing   

## 2022-05-15 NOTE — Progress Notes (Signed)
AVS placed in packet for discharge. Patient aware of [facility] coming to transport to SNF for rehab.

## 2022-05-15 NOTE — Discharge Summary (Signed)
Physician Discharge Summary  Lee Nelson [id number] DOB: Oct 20, 1940 DOA: 05/06/2022  PCP: Prince Solian, MD  Admit date: 05/06/2022 Discharge date: 05/15/2022  Time spent: 44 minutes  Recommendations for Outpatient Follow-up:  Consider outpatient referral to neurosurgery for normal pressure hydrocephalus but unlikely would be high yield Please look at his medications carefully as you may be able to de-escalate off of various meds which are not consistent with longevity such as statin etc. etc. Please obtain interval CXR 2 view in about 1 month from now  Discharge Diagnoses:  MAIN problem for hospitalization   COVID pneumonia Superimposed bacterial pneumonia Multiple about abdominal surgeries in the past with chronic constipation BMI 17 PSVT Reflux AKI on admission Normal pressure hydrocephalus this admission Hypokalemia on admission  Please see below for itemized issues addressed in [facility]- refer to other progress notes for clarity if needed  Discharge Condition: Improved  Diet recommendation: Heart healthy-would recommend dysphagia 3 initially and may need speech therapy to reevaluate for consistencies in the outpatient setting  Filed Weights   05/06/22 1727 05/14/22 0535 05/15/22 0500  Weight: 55 kg 51.5 kg 51.4 kg    History of present illness:  82 year old white male known chronic constipation followed by Dr. Paulita Fujita BPH with LUTS Multiple abdominal surgeries in the past with small bowel obstructions previously in 2021 Severe malnutrition BMI of 17 PSVT Reflux   Patient was found to be have slipped out of his bed 05/06/2022  febrile in the ED with Tmax 102.6 O2 sats in the 80s on 3 L platelets 100 COVID was positive CT chest showed no PE CT workup of head showed normal pressure hydrocephalus   He was admitted with working diagnosis of severe sepsis secondary to COVID 19 pneumonia plus aspiration pneumonia and was resuscitated appropriately   2/9  palliative care consulted  Hospital Course:  Severe sepsis secondary to COVID-19 Possible aspiration pneumonia Acute hypoxic respiratory failure on admission-initially on 3 L nasal on room air - CT chest right-sided infiltrate status post remdesivir X 3 days 2/8- - Completed Unasyn 7 days on 05/13/2022 - Contaminant of Streptococcus in 1 bottle -Patient completed period of isolation and can come off precautions (10 days) on 2/16   Possible aspiration - Speech therapy started him on dysphagia 3 diet - Liberalize diet as best possible but patient may not like it-he may need to be graduated to a regular diet   Hypokalemia on admission - Resolved-no need for replacement currently   Normal pressure hydrocephalus - Dr. Malen Gauze neurology discussed with EDP, recommendation was made for outpatient neurosurgery follow-up - I suspect this is chronic   AKI on admission - Was on IV fluids initially which have been discontinued and we have adjusted his medications   History of PSVT - Telemetry reviewed and benign   Chronic thrombocytopenia - His acute drop in platelets and has improved and rebounded   Discharge Exam: Vitals:   05/14/22 1932 05/15/22 0424  BP: 118/67 116/60  Pulse: 77 75  Resp: 18 18  Temp: 98.6 F (37 C) 98.3 F (36.8 C)  SpO2: 96% 99%    Subj on day of d/c   Looks well no fever no chills no nausea no vomiting Ambulating CTAB no added sound no rales no rhonchi no wheeze S1-S2 no murmur rub no gallop Telemetry reviewed   Discharge Instructions   Discharge Instructions     Diet - low sodium heart healthy   Complete by: As directed    Increase activity slowly  Complete by: As directed       Allergies as of 05/15/2022       Reactions   Ciprofloxacin    JOINT PAIN   Flagyl [metronidazole] Other (See Comments)   REACTION: no appetite, diarrhea after meal, decrease in weight   Metoclopramide Hcl Other (See Comments)   REACTION: "involuntary  movements"   Other Other (See Comments)   Antibiotics have unknown reaction propophol causes memory problems   Propofol Other (See Comments)   Other Reaction(s): Other (See Comments) Memory problems   Risperidone Other (See Comments)   "do not want"   Silodosin    ? Possibly allergy, could not breathe well out of nose   Soy Allergy Other (See Comments)   Stomach upset, "gas" Other Reaction(s): Other (See Comments)   Sulfamethoxazole-trimethoprim Other (See Comments)   REACTION: "involuntary movements" tripac antibiotic- heart rythm Other Reaction(s): Other (See Comments) Unknown reaction        Medication List     STOP taking these medications    docusate sodium 50 MG capsule Commonly known as: COLACE   Paxlovid (300/100) 20 x 150 MG & 10 x 100MG Tbpk Generic drug: nirmatrelvir & ritonavir       TAKE these medications    Cranberry 400 MG Caps Take 400-1,200 mg by mouth every morning.   MAGNESIUM CITRATE PO Take 75 mg by mouth daily.   melatonin 3 MG Tabs tablet Take 1 tablet (3 mg total) by mouth at bedtime.   NON FORMULARY Take 1 capsule by mouth daily. Acetyl L carnitine   OVER THE COUNTER MEDICATION Take 3 capsules by mouth 2 (two) times daily. Lion's mane 0.5 gram/capsule   OVER THE COUNTER MEDICATION Take 2 capsules by mouth every morning. Reparagen supplement   Phosphatidylserine 100 MG Caps Take 100 mg by mouth every morning.   PROBIOTIC DAILY PO Take 1 capsule by mouth daily.   senna-docusate 8.6-50 MG tablet Commonly known as: Senokot-S Take 1 tablet by mouth at bedtime.   sodium chloride 5 % ophthalmic ointment Commonly known as: MURO 0000000 Place 1 application into both eyes at bedtime.   sodium chloride 5 % ophthalmic solution Commonly known as: MURO 128 See admin instructions.   Systane Balance 0.6 % Soln Generic drug: Propylene Glycol Apply 1 drop to eye daily as needed (dry eyes).   UNABLE TO FIND Med Name: cocovia memory    Vitamin B Complex Tabs Take 1 tablet by mouth daily.   vitamin C 250 MG tablet Commonly known as: ASCORBIC ACID Take 250 mg by mouth daily.   Vitamin D-3 25 MCG (1000 UT) Caps Take 2,000 Units by mouth daily.   VITAMIN K2 PO Take 120-240 mg by mouth daily. Combo w/ Vit. D   zinc sulfate 220 (50 Zn) MG capsule Take 1 capsule (220 mg total) by mouth daily.       Allergies  Allergen Reactions   Ciprofloxacin     JOINT PAIN   Flagyl [Metronidazole] Other (See Comments)    REACTION: no appetite, diarrhea after meal, decrease in weight   Metoclopramide Hcl Other (See Comments)    REACTION: "involuntary movements"   Other Other (See Comments)    Antibiotics have unknown reaction propophol causes memory problems   Propofol Other (See Comments)    Other Reaction(s): Other (See Comments)  Memory problems   Risperidone Other (See Comments)    "do not want"   Silodosin     ? Possibly allergy, could not breathe well  out of nose   Soy Allergy Other (See Comments)    Stomach upset, "gas"  Other Reaction(s): Other (See Comments)   Sulfamethoxazole-Trimethoprim Other (See Comments)    REACTION: "involuntary movements"  tripac antibiotic- heart rythm  Other Reaction(s): Other (See Comments)  Unknown reaction    Contact information for after-discharge care     Destination     [name] Preferred SNF .   Service: Skilled Nursing Contact information: [address] [place] [phone]                      The results of significant diagnostics from this hospitalization (including imaging, microbiology, ancillary and laboratory) are listed below for reference.    Significant Diagnostic Studies: DG Swallowing Func-Speech Pathology  Result Date: 05/08/2022 Table formatting from the original result was not included. Modified Barium Swallow Study Patient Details Name: MARQUAVION ZENK MRN: [id number] Date of Birth:  26-Apr-1940 Today's Date: 05/08/2022 HPI/PMH: HPI: 82 yo male adm to [facility] - with AMS, He slipped OOB, tested + for COVID, bladder ca- pt denies states its urinary retention, dementia, GERD s/p [employer]- faint reflux on DG esophagram 2012, vocal fold atrophy- takes lots of liquids to swallow pill, skin cancer in situ hx, 2 days weakness, cxr concerning for aspiration- Mucus debris or aspirated content noted in the right mainstem bronchus extending into the bronchus intermedius and right lower lobe bronchi. There is impaction of a right lower lobe bronchus, Infiltration of the fat surrounding the esophagus. ? esophagitis. FEES 2016 showed retention that was improved with chin tuck posture.   MBS done at [facility] 11/2021 showed laryngeal penetration of thin intermittently, incomplete airway closure, residual worse with increased viscocity,  Also question impaired distention of UES, with decreased oral propulsion.  Pt reports weight loss due to ABX - 20 pounds - and unable to put back on the weight.  Pt also reports problems with tooth and nasal drainage.  Pt has muscle tension dysphonia per notes from [facility] - likely contributing to his dysphagia.  Swallow eval ordered. Clinical Impression: Clinical Impression: Pt presents with oropharyngeal dysphagia characterized by reduction in bolus cohesion, tongue base retraction, laryngeal elevation, anterior laryngeal movement, and epiglottic inversion. The swallow was often triggered with the head of the bolus at the level valleculae or the pyriform sinuses. Pt demonstrated vallecular residue, posterior pharyngeal wall residue, pyriform sinus residue, and minimal PES distention. Pharyngeal residue was improved, but not eliminated with a liquid wash and pt's independent use of secondary swallows also reduced residue. Penetration (PAS 5) and aspiration (PAS 7, 8) were noted with thin liquids and with nectar thick liquids. Aspiration occurred before, during, and after secondary to the  pharyngeal delay, reduced laryngeal vestibule closure, and pharyngeal residue, respectively. Coughing was noted with larger amounts of aspiration, but smaller quantities did not trigger coughing; pt's cough was ineffective in expelling aspirated material. Both an effortful swallow and chin tuck eliminated aspiration of nectar thick liquids and reduced quantity of aspiration or delayed aspiration with thin liquids.  Pt's overall swallow function today appears worse than that noted with his modified barium swallow study in September 2023. SLP suspects acute on chronic dysphagia and he is currently at risk for dysphagia-related adverse events in the setting of GERD, and acute illness. Palliative consult may be useful to help establish GOC; Dr. Louanne Belton advised of pt's performance and recommendations. A dysphagia 2 diet with nectar thick liquids is recommended at this time with strict  observance of swallowing precautions and SLP will continue to follow pt. Factors that may increase risk of adverse event in presence of aspiration Phineas Douglas & Padilla 2021): Respiratory or GI disease Recommendations/Plan: Swallowing Evaluation Recommendations Swallowing Evaluation Recommendations Recommendations: PO diet PO Diet Recommendation: Dysphagia 2 (Finely chopped); Mildly thick liquids (Level 2, nectar thick) Liquid Administration via: Cup; No straw Medication Administration: Crushed with puree Supervision: Staff to assist with self-feeding; Full supervision/cueing for swallowing strategies Swallowing strategies  : Slow rate; Small bites/sips; Follow solids with liquids; Multiple dry swallows after each bite/sip (Effortful swallow) Postural changes: Position pt fully upright for meals; Stay upright 30-60 min after meals Oral care recommendations: Oral care BID (2x/day) Recommended consults: Consider Palliative care Treatment Plan Treatment Plan Treatment recommendations: Therapy as outlined in treatment plan below Follow-up  recommendations: Skilled nursing-short term rehab (<3 hours/day) Functional status assessment: Patient has had a recent decline in their functional status and/or demonstrates limited ability to make significant improvements in function in a reasonable and predictable amount of time. Treatment frequency: Min 2x/week Treatment duration: 2 weeks Interventions: Diet toleration management by SLP; Trials of upgraded texture/liquids Recommendations Recommendations for follow up therapy are one component of a multi-disciplinary discharge planning process, led by the attending physician.  Recommendations may be updated based on patient status, additional functional criteria and insurance authorization. Assessment: Orofacial Exam: Orofacial Exam Oral Cavity - Dentition: Adequate natural dentition Anatomy: Anatomy: WFL Thin Liquids: Thin Liquids (Level 0) Thin Liquids : Impaired Bolus delivery method: Cup Thin Liquid - Impairment: Oral Impairment; Pharyngeal impairment Lip Closure: No labial escape Tongue control during bolus hold: Posterior escape of less than half of bolus Bolus transport/lingual motion: Brisk tongue motion Oral residue: Trace residue lining oral structures Location of oral residue : Tongue Initiation of swallow : Pyriform sinuses Soft palate elevation: Partial Laryngeal elevation: Partial or minimal superior movement and approximation Anterior hyoid excursion: Partial Epiglottic movement: Partial Laryngeal vestibule closure: Incomplete Pharyngeal stripping wave : Present - diminished Pharyngeal contraction (A/P view only): N/A Pharyngoesophageal segment opening: Minimal distention/minimal duration, marked obstruction of flow Tongue base retraction: Wide column of contrast or air between tongue base and PPW Pharyngeal residue: Collection of residue within or on pharyngeal structures Location of pharyngeal residue: Valleculae; Pharyngeal wall; Pyriform sinuses Penetration/Aspiration Scale (PAS) score: 7.   Material enters airway, passes BELOW cords and not ejected out despite cough attempt by patient; 8.  Material enters airway, passes BELOW cords without attempt by patient to eject out (silent aspiration)  Mildly Thick Liquids: Mildly thick liquids (Level 2, nectar thick) Mildly thick liquids (Level 2, nectar thick): Impaired Bolus delivery method: Cup Mildly Thick Liquid - Impairment: Oral Impairment; Pharyngeal impairment Lip Closure: No labial escape Tongue control during bolus hold: Cohesive bolus between tongue to palatal seal Bolus transport/lingual motion: Brisk tongue motion Oral residue: Trace residue lining oral structures Location of oral residue : Tongue Initiation of swallow : Valleculae Soft palate elevation: Partial Laryngeal elevation: Partial or minimal superior movement and approximation Anterior hyoid excursion: Partial Epiglottic movement: Partial Laryngeal vestibule closure: Incomplete Pharyngeal stripping wave : Present - diminished Pharyngeal contraction (A/P view only): N/A Pharyngoesophageal segment opening: Minimal distention/minimal duration, marked obstruction of flow Tongue base retraction: Wide column of contrast or air between tongue base and PPW Pharyngeal residue: Collection of residue within or on pharyngeal structures Location of pharyngeal residue: Valleculae; Pharyngeal wall; Pyriform sinuses Penetration/Aspiration Scale (PAS) score: 7.  Material enters airway, passes BELOW cords and not ejected out despite cough attempt  by patient  Moderately Thick Liquids: Moderately thick liquids (Level 3, honey thick) Moderately thick liquids (Level 3, honey thick): Impaired Bolus delivery method: Spoon; Cup Moderately Thick Liquid - Impairment: Pharyngeal impairment Initiation of swallow : Valleculae Soft palate elevation: Complete Laryngeal elevation: Partial or minimal superior movement and approximation Anterior hyoid excursion: Partial Epiglottic movement: Partial Laryngeal vestibule  closure: Incomplete Pharyngeal stripping wave : Present - diminished Pharyngeal contraction (A/P view only): N/A Pharyngoesophageal segment opening: Minimal distention/minimal duration, marked obstruction of flow Tongue base retraction: Wide column of contrast or air between tongue base and PPW Pharyngeal residue: Collection of residue within or on pharyngeal structures Location of pharyngeal residue: Valleculae; Pharyngeal wall; Pyriform sinuses Penetration/Aspiration Scale (PAS) score: 1.  Material does not enter airway  Puree: Puree Puree: Impaired Puree - Impairment: Pharyngeal impairment; Oral Impairment Lip Closure: No labial escape Bolus transport/lingual motion: Slow tongue motion Oral residue: Residue collection on oral structures Location of oral residue : Tongue Initiation of swallow: Valleculae Soft palate elevation: Complete Laryngeal elevation: Partial or minimal superior movement and approximation Anterior hyoid excursion: Partial Epiglottic movement: Partial (but better than with less viscous consistencies.) Laryngeal vestibule closure: Complete: No air/contrast in laryngeal vestibule Pharyngeal stripping wave : Present - diminished Pharyngeal contraction (A/P view only): N/A Pharyngoesophageal segment opening: Minimal distention/minimal duration, marked obstruction of flow Tongue base retraction: Wide column of contrast or air between tongue base and PPW Pharyngeal residue: Collection of residue within or on pharyngeal structures Location of pharyngeal residue: Valleculae; Pharyngeal wall; Pyriform sinuses Penetration/Aspiration Scale (PAS) score: 1.  Material does not enter airway Solid: Solid Solid: Impaired Solid - Impairment: Pharyngeal impairment Initiation of swallow: Posterior angle of the ramus Soft palate elevation: Complete Laryngeal elevation: Partial or minimal superior movement and approximation Anterior hyoid excursion: Partial Epiglottic movement: Partial Laryngeal vestibule closure:  Incomplete Pharyngeal stripping wave : Present - diminished Pharyngeal contraction (A/P view only): N/A Pharyngoesophageal segment opening: Minimal distention/minimal duration, marked obstruction of flow Tongue base retraction: Wide column of contrast or air between tongue base and PPW Pharyngeal residue: Collection of residue within or on pharyngeal structures Location of pharyngeal residue: Valleculae; Pharyngeal wall; Pyriform sinuses Penetration/Aspiration Scale (PAS) score: 1.  Material does not enter airway Pill: Pill Pill: Not Tested Compensatory Strategies: Compensatory Strategies Compensatory strategies: Yes Straw: Effective; Ineffective Effortful swallow: Effective; Ineffective Effective Effortful Swallow: Mildly thick liquid (Level 2, nectar thick) Ineffective Effortful Swallow: Thin liquid (Level 0) Multiple swallows: Effective Effective Multiple Swallows: Puree; Thin liquid (Level 0); Mildly thick liquid (Level 2, nectar thick); Moderately thick liquid (Level 3, honey thick); Solid (reduced, but did not eliminate residue) Chin tuck: Ineffective; Effective Effective Chin Tuck: Mildly thick liquid (Level 2, nectar thick) Ineffective Chin Tuck: Thin liquid (Level 0); Mildly thick liquid (Level 2, nectar thick) Liquid wash: Effective Effective Liquid Wash: Puree; Solid   General Information: Caregiver present: No  Diet Prior to this Study: Dysphagia 3 (mechanical soft); Thin liquids (Level 0)   Temperature : Normal   Respiratory Status: WFL   Supplemental O2: None (Room air)   History of Recent Intubation: No  Behavior/Cognition: Alert; Cooperative Self-Feeding Abilities: Able to self-feed No data recorded Volitional Cough: -- (N/A) Volitional Swallow: Able to elicit Exam Limitations: Poor positioning Goal Planning: Prognosis for improved oropharyngeal function: Fair Barriers to Reach Goals: Time post onset; Severity of deficits No data recorded Patient/Family Stated Goal: none stated Consulted and agree  with results and recommendations: Patient Pain: Pain Assessment Pain Assessment: Faces Faces Pain Scale:  0 Pain Location: legs Pain Descriptors / Indicators: Discomfort; Constant Pain Intervention(s): Monitored during session End of Session: Start Time:SLP Start Time (ACUTE ONLY): 1230 Stop Time: SLP Stop Time (ACUTE ONLY): 1301 Time Calculation:SLP Time Calculation (min) (ACUTE ONLY): 31 min Charges: SLP Evaluations $ SLP Speech Visit: 1 Visit SLP Evaluations $BSS Swallow: 1 Procedure $MBS Swallow: 1 Procedure $Swallowing Treatment: 1 Procedure SLP visit diagnosis: SLP Visit Diagnosis: Dysphagia, oropharyngeal phase (R13.12); Dysphagia, pharyngoesophageal phase (R13.14) Past Medical History: Past Medical History: Diagnosis Date  Anxiety   Arthritis   Bladder calculus   BPH (benign prostatic hyperplasia)   Chronic constipation   Complication of anesthesia   " I had some coughing afterwards for a couple of days"--  per pt "perfers spinal anesthesia since general anesthesia congnitive issues when older"  Diverticulosis of colon   Dry eye syndrome of both eyes   Environmental allergies   GERD (gastroesophageal reflux disease)   occasional  History of adenomatous polyp of colon   08/ 2004  History of kidney stones   History of squamous cell carcinoma in situ (SCCIS) of skin   s/p  excision 2013 facial areas and 06/ 2016 nose  Migraine   eye migraine occasional  Seasonal and perennial allergic rhinitis   Thrombocytopenia (HCC)   Tingling   hands and feet bilat , intermittantly-- per pt has lumbar bulging disk  Urinary frequency   Vocal fold atrophy   dysphonia-- per pt has to drink large amount of water to take even on pill  Wears glasses  Past Surgical History: Past Surgical History: Procedure Laterality Date  APPENDECTOMY  child  CARDIOVASCULAR STRESS TEST  05-17-2015  dr hilty  Low risk nuclear study w/ no ischemia/  normal LV function and wall motion , stress ef 54% (lvef 45-54%)  CHOLECYSTECTOMY N/A 09/29/2014   Procedure: LAPAROSCOPIC CHOLECYSTECTOMY WITH INTRAOPERATIVE CHOLANGIOGRAM;  Surgeon: Alphonsa Overall, MD;  Location: [facility];  Service: General;  Laterality: N/A;  COLONOSCOPY  last one 09-06-2010  ESOPHAGOGASTRODUODENOSCOPY N/A 12/09/2013  Procedure: ESOPHAGOGASTRODUODENOSCOPY (EGD);  Surgeon: Arta Silence, MD;  Location: [facility] ENDOSCOPY;  Service: Endoscopy;  Laterality: N/A;  EXTRACORPOREAL SHOCK WAVE LITHOTRIPSY  yrs ago  INGUINAL HERNIA REPAIR Left child  inguinal hernia repair  [id number]  NISSEN FUNDOPLICATION  [id number]  open  STONE EXTRACTION WITH BASKET N/A 08/18/2016  Procedure: STONE EXTRACTION WITH BASKET;  Surgeon: Carolan Clines, MD;  Location[facility];  Service: Urology;  Laterality: N/A;  THULIUM LASER TURP (TRANSURETHRAL RESECTION OF PROSTATE) N/A 08/18/2016  Procedure: THULIUM LASER BLADDER NECK INCISION AND BLADDER STONE REMOVAL;  Surgeon: Carolan Clines, MD;  Location: [facility];  Service: Urology;  Laterality: N/A;  TONSILLECTOMY  child  TRANSTHORACIC ECHOCARDIOGRAM  [id number]  grade 1 diastolic dysfunction, ef [id number]  trivial MR and TR/ mild dilated RA Shanika I. Hardin Negus, [place], [place] Office number [phone] Horton Marshall 05/08/2022, 2:28 PM  CT Angio Chest PE W and/or Wo Contrast  Result Date: 05/06/2022 CLINICAL DATA:  Concern for pulmonary embolism. EXAM: CT ANGIOGRAPHY CHEST WITH CONTRAST TECHNIQUE: Multidetector CT imaging of the chest was performed using the standard protocol during bolus administration of intravenous contrast. Multiplanar CT image reconstructions and MIPs were obtained to evaluate the vascular anatomy. RADIATION DOSE REDUCTION: This exam was performed according to the departmental dose-optimization program which includes automated exposure control, adjustment of the mA and/or kV according to patient size and/or use of iterative reconstruction technique. CONTRAST:  76m OMNIPAQUE IOHEXOL 350 MG/ML  SOLN COMPARISON:  Chest radiograph dated 05/06/2022. FINDINGS: Evaluation of this exam is limited due to respiratory motion artifact. Evaluation is also limited due to streak artifact caused by patient's arms and overlying support wires. Cardiovascular: There is no cardiomegaly or pericardial effusion. Mild atherosclerotic calcification of the thoracic aorta. No aneurysmal dilatation or dissection. The origins of the great vessels of the aortic arch appear patent. Evaluation of the pulmonary arteries is limited due to respiratory motion. No pulmonary artery embolus identified. Mediastinum/Nodes: No hilar or mediastinal adenopathy. There is infiltration of the fat surrounding the esophagus. Correlation with clinical exam recommended to exclude esophagitis. No mediastinal fluid collection. Lungs/Pleura: Mild bronchiectasis. Clusters of ground-glass nodular densities predominantly involving the right lower lobe as well as patchy area of airspace density at the right lung base most consistent with aspiration or pneumonia. Patchy area of interstitial coarsening involving the right upper lobe, likely chronic. There are biapical subpleural scarring. There is a small right pleural effusion. No pneumothorax. Mucus debris or aspirated content noted in the right mainstem bronchus extending into the bronchus intermedius and right lower lobe bronchi. There is impaction of a right lower lobe bronchus. Upper Abdomen: A 5 cm cyst in the left lobe of the liver. Musculoskeletal: No acute osseous pathology. Review of the MIP images confirms the above findings. IMPRESSION: 1. No CT evidence of pulmonary artery embolus. 2. Right lower lobe aspiration or pneumonia. 3. Small right pleural effusion. 4. Infiltration of the fat surrounding the esophagus. Correlation with clinical exam recommended to exclude esophagitis. 5.  Aortic Atherosclerosis (ICD10-I70.0). Electronically Signed   By: Anner Crete M.D.   On: 05/06/2022 20:26   CT  Cervical Spine Wo Contrast  Result Date: 05/06/2022 CLINICAL DATA:  Neck trauma. EXAM: CT CERVICAL SPINE WITHOUT CONTRAST TECHNIQUE: Multidetector CT imaging of the cervical spine was performed without intravenous contrast. Multiplanar CT image reconstructions were also generated. RADIATION DOSE REDUCTION: This exam was performed according to the departmental dose-optimization program which includes automated exposure control, adjustment of the mA and/or kV according to patient size and/or use of iterative reconstruction technique. COMPARISON:  None Available. FINDINGS: Alignment: Normal. Skull base and vertebrae: No acute fracture. No primary bone lesion or focal pathologic process. Soft tissues and spinal canal: No prevertebral fluid or swelling. No visible canal hematoma. Disc levels:  Maintained Upper chest: Biapical pleural/parenchymal changes most likely scarring. IMPRESSION: No acute traumatic abnormalities. Electronically Signed   By: Sammie Bench M.D.   On: 05/06/2022 12:46   DG Chest Portable 1 View  Result Date: 05/06/2022 CLINICAL DATA:  Cough, weakness, and fever.  COVID positive. EXAM: PORTABLE CHEST 1 VIEW COMPARISON:  Chest x-ray dated April 19, 2017. FINDINGS: The heart size and mediastinal contours are within normal limits. Normal pulmonary vascularity. Chronic biapical pleural-parenchymal scarring again noted. No focal consolidation, pleural effusion, or pneumothorax. No acute osseous abnormality. IMPRESSION: No active disease. Electronically Signed   By: Titus Dubin M.D.   On: 05/06/2022 10:13   CT Thoracic Spine Wo Contrast  Result Date: 05/06/2022 CLINICAL DATA:  [place] out of bed.  COVID positive. EXAM: CT THORACIC AND LUMBAR SPINE WITHOUT CONTRAST TECHNIQUE: Multidetector CT imaging of the thoracic and lumbar spine was performed without contrast. Multiplanar CT image reconstructions were also generated. RADIATION DOSE REDUCTION: This exam was performed according to the  departmental dose-optimization program which includes automated exposure control, adjustment of the mA and/or kV according to patient size and/or use of iterative reconstruction technique. COMPARISON:  Lumbar spine x-rays dated  April 14, 2022. MRI lumbar spine dated July 15, 2020. Chest x-ray dated April 19, 2017. FINDINGS: CT THORACIC SPINE FINDINGS Alignment: Normal. Vertebrae: No acute fracture or focal pathologic process. Osteopenia. Paraspinal and other soft tissues: Biapical pleuroparenchymal scarring. Right upper lobe bronchiectasis. Disc levels: Disc heights are relatively preserved. Minimal degenerative disc disease in the midthoracic spine. CT LUMBAR SPINE FINDINGS Segmentation: 5 lumbar type vertebrae. Alignment: Mild dextroscoliosis.  No significant listhesis. Vertebrae: No acute fracture or focal pathologic process. Old healed fracture of the right L2 transverse process. Osteopenia. Paraspinal and other soft tissues: Aortoiliac atherosclerotic vascular disease. 5.1 cm simple cyst in the left liver. Few punctate calculi in the lower pole of the left kidney. Disc levels: Mild disc bulging at L4-L5 and L5-S1. Mild bilateral facet arthropathy from L3-L4 through L5-S1. IMPRESSION: 1. No acute osseous abnormality of the thoracic or lumbar spine. 2. Nonobstructive left nephrolithiasis. 3.  Aortic Atherosclerosis (ICD10-I70.0). Electronically Signed   By: Titus Dubin M.D.   On: 05/06/2022 10:08   CT Lumbar Spine Wo Contrast  Result Date: 05/06/2022 CLINICAL DATA:  [place] out of bed.  COVID positive. EXAM: CT THORACIC AND LUMBAR SPINE WITHOUT CONTRAST TECHNIQUE: Multidetector CT imaging of the thoracic and lumbar spine was performed without contrast. Multiplanar CT image reconstructions were also generated. RADIATION DOSE REDUCTION: This exam was performed according to the departmental dose-optimization program which includes automated exposure control, adjustment of the mA and/or kV according to  patient size and/or use of iterative reconstruction technique. COMPARISON:  Lumbar spine x-rays dated April 14, 2022. MRI lumbar spine dated July 15, 2020. Chest x-ray dated April 19, 2017. FINDINGS: CT THORACIC SPINE FINDINGS Alignment: Normal. Vertebrae: No acute fracture or focal pathologic process. Osteopenia. Paraspinal and other soft tissues: Biapical pleuroparenchymal scarring. Right upper lobe bronchiectasis. Disc levels: Disc heights are relatively preserved. Minimal degenerative disc disease in the midthoracic spine. CT LUMBAR SPINE FINDINGS Segmentation: 5 lumbar type vertebrae. Alignment: Mild dextroscoliosis.  No significant listhesis. Vertebrae: No acute fracture or focal pathologic process. Old healed fracture of the right L2 transverse process. Osteopenia. Paraspinal and other soft tissues: Aortoiliac atherosclerotic vascular disease. 5.1 cm simple cyst in the left liver. Few punctate calculi in the lower pole of the left kidney. Disc levels: Mild disc bulging at L4-L5 and L5-S1. Mild bilateral facet arthropathy from L3-L4 through L5-S1. IMPRESSION: 1. No acute osseous abnormality of the thoracic or lumbar spine. 2. Nonobstructive left nephrolithiasis. 3.  Aortic Atherosclerosis (ICD10-I70.0). Electronically Signed   By: Titus Dubin M.D.   On: 05/06/2022 10:08   CT Head Wo Contrast  Result Date: 05/06/2022 CLINICAL DATA:  Head trauma. EXAM: CT HEAD WITHOUT CONTRAST TECHNIQUE: Contiguous axial images were obtained from the base of the skull through the vertex without intravenous contrast. RADIATION DOSE REDUCTION: This exam was performed according to the departmental dose-optimization program which includes automated exposure control, adjustment of the mA and/or kV according to patient size and/or use of iterative reconstruction technique. COMPARISON:  None Available. FINDINGS: Brain: There is periventricular white matter decreased attenuation consistent with small vessel ischemic changes.  Ventricles, sulci and cisterns are prominent consistent with age related involutional changes. No acute intracranial hemorrhage, mass effect or shift. There is ventricular prominence is out of proportion to the extent of involutional changes suggesting normal pressure hydrocephalus. Vascular: No hyperdense vessel or unexpected calcification. Skull: Normal. Negative for fracture or focal lesion. Sinuses/Orbits: There is a splenoid cyst or polyp measuring 1.1 cm. Mucoperiosteal thickening consistent with chronic maxillary and  ethmoid sinusitis. IMPRESSION: 1. Atrophy and chronic small vessel ischemic changes. 2. Findings suggest normal pressure hydrocephalus. 3. Sequelae of chronic sinusitis. Electronically Signed   By: Sammie Bench M.D.   On: 05/06/2022 09:56    Microbiology: Recent Results (from the past 240 hour(s))  Resp panel by RT-PCR (RSV, Flu A&B, Covid) Anterior Nasal Swab     Status: Abnormal   Collection Time: 05/06/22  9:55 AM   Specimen: Anterior Nasal Swab  Result Value Ref Range Status   SARS Coronavirus 2 by RT PCR POSITIVE (A) NEGATIVE Final    Comment: (NOTE) SARS-CoV-2 target nucleic acids are DETECTED.  The SARS-CoV-2 RNA is generally detectable in upper respiratory specimens during the acute phase of infection. Positive results are indicative of the presence of the identified virus, but do not rule out bacterial infection or co-infection with other pathogens not detected by the test. Clinical correlation with patient history and other diagnostic information is necessary to determine patient infection status. The expected result is Negative.  Fact Sheet for Patients: EntrepreneurPulse.com.au  Fact Sheet for Healthcare Providers: IncredibleEmployment.be  This test is not yet approved or cleared by the [place] FDA and  has been authorized for detection and/or diagnosis of SARS-CoV-2 by FDA under an Emergency Use Authorization  (EUA).  This EUA will remain in effect (meaning this test can be used) for the duration of  the COVID-19 declaration under Section 564(b)(1) of the A ct, 21 U.S.C. section 360bbb-3(b)(1), unless the authorization is terminated or revoked sooner.     Influenza A by PCR NEGATIVE NEGATIVE Final   Influenza B by PCR NEGATIVE NEGATIVE Final    Comment: (NOTE) The Xpert Xpress SARS-CoV-2/FLU/RSV plus assay is intended as an aid in the diagnosis of influenza from Nasopharyngeal swab specimens and should not be used as a sole basis for treatment. Nasal washings and aspirates are unacceptable for Xpert Xpress SARS-CoV-2/FLU/RSV testing.  Fact Sheet for Patients: EntrepreneurPulse.com.au  Fact Sheet for Healthcare Providers: IncredibleEmployment.be  This test is not yet approved or cleared by the [place] FDA and has been authorized for detection and/or diagnosis of SARS-CoV-2 by FDA under an Emergency Use Authorization (EUA). This EUA will remain in effect (meaning this test can be used) for the duration of the COVID-19 declaration under Section 564(b)(1) of the Act, 21 U.S.C. section 360bbb-3(b)(1), unless the authorization is terminated or revoked.     Resp Syncytial Virus by PCR NEGATIVE NEGATIVE Final    Comment: (NOTE) Fact Sheet for Patients: EntrepreneurPulse.com.au  Fact Sheet for Healthcare Providers: IncredibleEmployment.be  This test is not yet approved or cleared by the [place] FDA and has been authorized for detection and/or diagnosis of SARS-CoV-2 by FDA under an Emergency Use Authorization (EUA). This EUA will remain in effect (meaning this test can be used) for the duration of the COVID-19 declaration under Section 564(b)(1) of the Act, 21 U.S.C. section 360bbb-3(b)(1), unless the authorization is terminated or revoked.  Performed at [facility], [place] [address] [place], [place] [address]   Culture, blood (Routine X 2) w Reflex to ID Panel     Status: Abnormal   Collection Time: 05/06/22  9:43 PM   Specimen: BLOOD LEFT FOREARM  Result Value Ref Range Status   Specimen Description   Final    BLOOD LEFT FOREARM Performed at [place] [address], [place], [place] [address]    Special Requests   Final    BOTTLES DRAWN AEROBIC AND  ANAEROBIC Blood Culture results may not be optimal due to an inadequate volume of blood received in culture bottles Performed at [facility], [place] [address] [place], [place] [address]    Culture  Setup Time   Final    GRAM POSITIVE COCCI IN CHAINS IN BOTH AEROBIC AND ANAEROBIC BOTTLES CRITICAL RESULT CALLED TO, READ BACK BY AND VERIFIED WITH: G. GADHIA PHARMD, AT [id number] 05/07/22 BY D. VANHOOK    Culture (A)  Final    STREPTOCOCCUS MITIS/ORALIS STREPTOCOCCUS PARASANGUINIS THE SIGNIFICANCE OF ISOLATING THIS ORGANISM FROM A SINGLE SET OF BLOOD CULTURES WHEN MULTIPLE SETS ARE DRAWN IS UNCERTAIN. PLEASE NOTIFY THE MICROBIOLOGY DEPARTMENT WITHIN ONE WEEK IF SPECIATION AND SENSITIVITIES ARE REQUIRED. Performed at [facility], [place] [address], [place], [place] [address]    Report Status 05/09/2022 FINAL  Final  Blood Culture ID Panel (Reflexed)     Status: Abnormal   Collection Time: 05/06/22  9:43 PM  Result Value Ref Range Status   Enterococcus faecalis NOT DETECTED NOT DETECTED Final   Enterococcus Faecium NOT DETECTED NOT DETECTED Final   Listeria monocytogenes NOT DETECTED NOT DETECTED Final   Staphylococcus species NOT DETECTED NOT DETECTED Final   Staphylococcus aureus (BCID) NOT DETECTED NOT DETECTED Final   Staphylococcus epidermidis NOT DETECTED NOT DETECTED Final   Staphylococcus lugdunensis NOT DETECTED NOT DETECTED Final   Streptococcus species DETECTED (A) NOT DETECTED Final    Comment: Not Enterococcus species, Streptococcus agalactiae, Streptococcus  pyogenes, or Streptococcus pneumoniae. CRITICAL RESULT CALLED TO, READ BACK BY AND VERIFIED WITH: G. GADHIA PHARMD, AT [id number] 05/07/22 BY D. VANHOOK    Streptococcus agalactiae NOT DETECTED NOT DETECTED Final   Streptococcus pneumoniae NOT DETECTED NOT DETECTED Final   Streptococcus pyogenes NOT DETECTED NOT DETECTED Final   A.calcoaceticus-baumannii NOT DETECTED NOT DETECTED Final   Bacteroides fragilis NOT DETECTED NOT DETECTED Final   Enterobacterales NOT DETECTED NOT DETECTED Final   Enterobacter cloacae complex NOT DETECTED NOT DETECTED Final   Escherichia coli NOT DETECTED NOT DETECTED Final   Klebsiella aerogenes NOT DETECTED NOT DETECTED Final   Klebsiella oxytoca NOT DETECTED NOT DETECTED Final   Klebsiella pneumoniae NOT DETECTED NOT DETECTED Final   Proteus species NOT DETECTED NOT DETECTED Final   Salmonella species NOT DETECTED NOT DETECTED Final   Serratia marcescens NOT DETECTED NOT DETECTED Final   Haemophilus influenzae NOT DETECTED NOT DETECTED Final   Neisseria meningitidis NOT DETECTED NOT DETECTED Final   Pseudomonas aeruginosa NOT DETECTED NOT DETECTED Final   Stenotrophomonas maltophilia NOT DETECTED NOT DETECTED Final   Candida albicans NOT DETECTED NOT DETECTED Final   Candida auris NOT DETECTED NOT DETECTED Final   Candida glabrata NOT DETECTED NOT DETECTED Final   Candida krusei NOT DETECTED NOT DETECTED Final   Candida parapsilosis NOT DETECTED NOT DETECTED Final   Candida tropicalis NOT DETECTED NOT DETECTED Final   Cryptococcus neoformans/gattii NOT DETECTED NOT DETECTED Final    Comment: Performed at [facility], [address], [place], [place] [address]  Culture, blood (Routine X 2) w Reflex to ID Panel     Status: None   Collection Time: 05/07/22 12:21 AM   Specimen: BLOOD LEFT ARM  Result Value Ref Range Status   Specimen Description   Final    BLOOD LEFT ARM Performed at [place] [address], [place], [place]  [address]    Special Requests   Final    BOTTLES DRAWN AEROBIC ONLY Blood Culture  adequate volume Performed at [facility] [address], [place], [place] [address]    Culture   Final    NO GROWTH 5 DAYS Performed at [facility], [place] [address], [place], [place] [address]    Report Status 05/12/2022 FINAL  Final     Labs: Basic Metabolic Panel: Recent Labs  Lab 05/09/22 0351 05/10/22 0527 05/12/22 0359 05/15/22 0456  NA 140 140 136 137  K 4.1 3.7 4.2 3.9  CL 106 106 104 103  CO2 25 26 25 25  $ GLUCOSE 160* 181* 161* 97  BUN 27* 29* 23 17  CREATININE 0.60* 0.59* 0.65 0.60*  CALCIUM 8.3* 8.1* 7.6* 8.2*  MG 2.1 2.0  --   --    Liver Function Tests: No results for input(s): "AST", "ALT", "ALKPHOS", "BILITOT", "PROT", "ALBUMIN" in the last 168 hours. No results for input(s): "LIPASE", "AMYLASE" in the last 168 hours. No results for input(s): "AMMONIA" in the last 168 hours. CBC: Recent Labs  Lab 05/09/22 0351 05/10/22 0527 05/12/22 0359 05/15/22 0456  WBC 8.1 9.0 8.0 8.2  NEUTROABS  --   --   --  6.4  HGB 13.9 12.8* 12.1* 12.3*  HCT 41.2 38.6* 35.9* 37.4*  MCV 96.0 96.5 96.5 95.7  PLT 104* 104* 140* 177   Cardiac Enzymes: No results for input(s): "CKTOTAL", "CKMB", "CKMBINDEX", "TROPONINI" in the last 168 hours. BNP: BNP (last 3 results) Recent Labs    05/06/22 0908  BNP 68.4    ProBNP (last 3 results) No results for input(s): "PROBNP" in the last 8760 hours.  CBG: Recent Labs  Lab 05/14/22 1142  GLUCAP 153*       Signed:  Nita Sells MD   [facility] 05/15/2022, 10:22 AM

## 2022-05-15 NOTE — TOC Transition Note (Signed)
Transition of Care [facility]) - CM/SW Discharge Note   Patient Details  Name: Lee Nelson MRN: [id number] Date of Birth: 07-31-1940  [facility] CM/SW Contact:  Bethann Berkshire, [place] Phone Number: 05/15/2022, 11:03 AM   Clinical Narrative:     Patient will DC to: [facility] Anticipated DC date: 05/15/22 Family notified: dtr in law [place] [phone]  Transport by: Corey Harold   Per MD patient ready for DC to [facility] healthcare. RN, patient, patient's family, and facility notified of DC. Discharge Summary and FL2 sent to facility. RN to call report prior to discharge [phone] Room 118). DC packet on chart. Ambulance transport requested for patient.   CSW will sign off for now as social work intervention is no longer needed. Please consult [place] again if new needs arise.    Final next level of care: Skilled Nursing Facility Barriers to Discharge: No Barriers Identified   Patient Goals and CMS Choice CMS Medicare.gov Compare Post Acute Care list provided to:: Patient Represenative (must comment) [name] (spouse))    Discharge Placement                Patient chooses bed at: [facility] Patient to be transferred to facility by: [facility] Name of family member notified: dtr in law [place] [phone] Patient and family notified of of transfer: 05/15/22  Discharge Plan and Services Additional resources added to the After Visit Summary for     Discharge Planning Services: CM Consult                                 Social Determinants of Health (SDOH) Interventions SDOH Screenings   Food Insecurity: No Food Insecurity (05/06/2022)  Housing: Low Risk  (05/06/2022)  Transportation Needs: No Transportation Needs (05/06/2022)  Utilities: Not At Risk (05/06/2022)  Tobacco Use: Low Risk  (05/06/2022)     Readmission Risk Interventions     No data to display

## 2022-05-16 ENCOUNTER — Encounter: Payer: Medicare Other | Admitting: Rehabilitative and Restorative Service Providers"

## 2022-05-16 NOTE — Therapy (Incomplete)
OUTPATIENT PHYSICAL THERAPY TREATMENT   Patient Name: Lee Nelson MRN: [id number] DOB:March 28, 1941, 82 y.o., male Today's Date: 05/16/2022  END OF SESSION:     Past Medical History:  Diagnosis Date   Anxiety    Arthritis    Bladder calculus    BPH (benign prostatic hyperplasia)    Chronic constipation    Complication of anesthesia    " I had some coughing afterwards for a couple of days"--  per pt "perfers spinal anesthesia since general anesthesia congnitive issues when older"   Diverticulosis of colon    Dry eye syndrome of both eyes    Environmental allergies    GERD (gastroesophageal reflux disease)    occasional   History of adenomatous polyp of colon    08/ 2004   History of kidney stones    History of squamous cell carcinoma in situ (SCCIS) of skin    s/p  excision 2013 facial areas and 06/ 2016 nose   Migraine    eye migraine occasional   Seasonal and perennial allergic rhinitis    Thrombocytopenia (HCC)    Tingling    hands and feet bilat , intermittantly-- per pt has lumbar bulging disk   Urinary frequency    Vocal fold atrophy    dysphonia-- per pt has to drink large amount of water to take even on pill   Wears glasses    Past Surgical History:  Procedure Laterality Date   APPENDECTOMY  child   CARDIOVASCULAR STRESS TEST  05-17-2015  dr hilty   Low risk nuclear study w/ no ischemia/  normal LV function and wall motion , stress ef 54% (lvef 45-54%)   CHOLECYSTECTOMY N/A 09/29/2014   Procedure: LAPAROSCOPIC CHOLECYSTECTOMY WITH INTRAOPERATIVE CHOLANGIOGRAM;  Surgeon: Alphonsa Overall, MD;  Location: [facility];  Service: General;  Laterality: N/A;   COLONOSCOPY  last one 09-06-2010   ESOPHAGOGASTRODUODENOSCOPY N/A 12/09/2013   Procedure: ESOPHAGOGASTRODUODENOSCOPY (EGD);  Surgeon: Arta Silence, MD;  Location: [facility] END[facility];  Service: Endoscopy;  Laterality: N/A;   EXTRACORPOREAL SHOCK WAVE LITHOTRIPSY  yrs ago   INGUINAL HERNIA REPAIR Left child   inguinal  hernia repair  [id number]   NISSEN FUNDOPLICATION  [id number]   open   STONE EXTRACTION WITH BASKET N/A 08/18/2016   Procedure: STONE EXTRACTION WITH BASKET;  Surgeon: Carolan Clines, MD;  Location: [facility];  Service: Urology;  Laterality: N/A;   THULIUM LASER TURP (TRANSURETHRAL RESECTION OF PROSTATE) N/A 08/18/2016   Procedure: THULIUM LASER BLADDER NECK INCISION AND BLADDER STONE REMOVAL;  Surgeon: Carolan Clines, MD;  [facility];  Service: Urology;  Laterality: N/A;   TONSILLECTOMY  child   TRANSTHORACIC ECHOCARDIOGRAM  11/18/2010   grade 1 diastolic dysfunction, ef [id number]  trivial MR and TR/ mild dilated RA   Patient Active Problem List   Diagnosis Date Noted   Pneumonia due to COVID-19 virus 05/06/2022   Severe sepsis ([place]) 05/06/2022   Acute hypoxemic respiratory failure (HCC) 05/06/2022   NPH (normal pressure hydrocephalus) ([place]) 05/06/2022   Physical deconditioning 05/06/2022   Thrombocytopenia ([place]) 05/06/2022   Dementia ([place]) 05/06/2022   SBO (small bowel obstruction) ([place]) 12/31/2019   Amaurosis fugax of right eye 08/25/2017   PSVT (paroxysmal supraventricular tachycardia) 09/12/2015   Chest pain 05/03/2015   Dyspnea 05/03/2015   ALLERGIC RHINITIS 09/21/2007   G E R D 09/21/2007   SMOKE INHALATION 09/21/2007   OSTEOPOROSIS 01/28/2007   PALPITATIONS 01/28/2007   COUGH 01/28/2007   ALLERGY 01/28/2007  PCP: Prince Solian, MD   REFERRING PROVIDER: Donella Stade, PA-C   REFERRING DIAG: M54.50 (ICD-10-CM) - Low back pain, unspecified back pain laterality, unspecified chronicity, unspecified whether sciatica present  Rationale for Evaluation and Treatment: Rehabilitation  THERAPY DIAG:  No diagnosis found.  ONSET DATE: years ago  SUBJECTIVE:                                                                                                                                                                                            SUBJECTIVE STATEMENT: He indicated he was hurrying today and didn't bring the cane.  He mentioned no specific pain upon arrival today.  Reported some stretching   PERTINENT HISTORY:  Dyspnea, chest pain, PSVT, Osteoporosis, allergies, GERD, arthritis anxiety  PAIN:  NPRS scale: 3/10 Pain location: low back Pain description: achy, discomfort Aggravating factors: bending and lifting when assisting his wife Relieving factors: rest, occasional over the counter pain med  PRECAUTIONS: FALL  WEIGHT BEARING RESTRICTIONS: No  FALLS:  Has patient fallen in last 6 months? Yes. Number of falls 3  LIVING ENVIRONMENT: Lives with: lives with their family and lives with their spouse Lives in: House/apartmentSingle point cane Stairs: pt has ramp in back, and has a couple of steps to enter his front door.  Has following equipment at home:   OCCUPATION: retired Barista professor at [employer]: "I want to improve my posture and get stronger"     OBJECTIVE:   DIAGNOSTIC FINDINGS:  Result Narrative 04/17/22  AP and lateral views of the lumbar spine reviewed.  Moderate degenerative changes noted throughout the facet joints of the lumbar spine, particularly inferior lumbar spine.  Disc space narrowing is noted.  No acute fracture.  No significant spondylolisthesis.  PATIENT SURVEYS:  04/23/22: FOTO eval: 54% (predicted 62%)  SCREENING FOR RED FLAGS: Bowel or bladder incontinence: No Cauda equina syndrome: No  COGNITION: 04/23/22 Overall cognitive status: WFL normal      SENSATION: 04/23/22 [facility]  MUSCLE LENGTH: 04/23/22 Hamstrings: Right 58 deg; Left 64 deg   POSTURE: 04/23/22 rounded shoulders and forward head, forward trunk flexion, decreased lumbar lordosis  PALPATION: 04/23/22 TTP: lumbar paraspinals, QL on Rt  LUMBAR ROM:   AROM 04/23/22  Flexion 40 CGA to prevent LOB  Extension 5  Right lateral flexion 18   Left lateral flexion 14  Right rotation Limited 75%  Left rotation Limited  75 %   (Blank rows = not tested)  LOWER EXTREMITY ROM:     Active  Right Eval 04/23/22 Left Eval 04/23/22  Hip flexion 105 108  Hip extension    Hip abduction    Hip adduction    Hip internal rotation    Hip external rotation    Knee flexion    Knee extension     (Blank rows = not tested)  LOWER EXTREMITY MMT:    MMT Right 04/23/22 Left 04/23/22 Right 05/16/2022 Left 05/16/2022  Hip flexion 4/5 4/5    Hip extension      Hip abduction 4/5 4/5    Hip adduction 4/5 4/5    Hip internal rotation      Hip external rotation      Knee flexion 5/5 5/5    Knee extension 5/5 5/5    Ankle dorsiflexion 5/5 5/5    Ankle plantarflexion      Ankle inversion      Ankle eversion       (Blank rows = not tested)  LUMBAR SPECIAL TESTS:  04/23/22: Straight leg raise test: Negative and Slump test: Negative  FUNCTIONAL TESTS:  04/30/2022:     04/30/22 0001  Balance  Balance Assessed Yes  Standardized Balance Assessment  Standardized Balance Assessment Berg Balance Test  Berg Balance Test  Sit to Stand 4  Standing Unsupported 4  Sitting with Back Unsupported but Feet Supported on Floor or Stool 4  Stand to Sit 4  Transfers 4  Standing Unsupported with Eyes Closed 3  Standing Unsupported with Feet Together 3  From Standing, Reach Forward with Outstretched Arm 3  From Standing Position, Pick up Object from Floor 4  From Standing Position, Turn to Look Behind Over each Shoulder 2  Turn 360 Degrees 4  Standing Unsupported, Alternately Place Feet on Step/Stool 2  Standing Unsupported, One Foot in Front 3  Standing on One Leg 1  Total Score 45    04/23/22: 5 times sit to stand: 19 seconds no UE support  GAIT: 04/23/22 Distance walked: 30 feet on level surfaces from gym to elevator Assistive device utilized: Single point cane Level of assistance: Modified independence Comments: shuffling gait pattern  with forward flexed trunk  TODAY'S TREATMENT:                                                                               DATE: 04/30/2022  Therex: Nustep UE/LE lvl 5 8 mins Supine lumbar trunk rotation 20 sec x 3 bilateral Standing green 2 x 15 bilateral rows Supine marching 2 x 10 Supine slr 2 x 10 bilateral Additional time spent in review of HEP for techniques.     TODAY'S TREATMENT:                                                                                DATE: 04/30/2022  Therex: Nustep UE/LE lvl 5 8 mins Supine lumbar trunk rotation 20 sec x 3 bilateral Standing green 2 x 15 bilateral rows Supine marching 2 x 10 Supine slr 2  x 10 bilateral Additional time spent in review of HEP for techniques.   Physical Performance Testing: Additional time spent in performance of BERG balance testing c verbal and visual instruction for performance  TODAY'S TREATMENT:                                                                                                        DATE: 04/23/22  Therex:    HEP instruction/performance c cues for techniques, handout provided.  Trial set performed of each for comprehension and symptom assessment.  See below for exercise list  PATIENT EDUCATION:  Education details: HEP, POC Person educated: Patient Education method: Explanation, Demonstration, Verbal cues, and Handouts Education comprehension: verbalized understanding, returned demonstration, and verbal cues required  HOME EXERCISE PROGRAM: Access Code: [email or web address] URL: [email or web address] Date: 04/23/2022 Prepared by: Kearney Hard  Exercises - Supine Lower Trunk Rotation  - 2 x daily - 7 x weekly - 3 reps - 20 seconds hold - Standing Shoulder Row with Anchored Resistance  - 2 x daily - 7 x weekly - 15 reps - Supine March  - 2 x daily - 7 x weekly - 20 reps - Supine Active Straight Leg Raise  - 2 x daily - 7 x weekly - 10 reps  ASSESSMENT:  CLINICAL IMPRESSION: Berg  balance testing at 45.  Pt required cues for adjustment to remind of HEP techniques.  Pt to continue to benefit from mobility and postural strengthening as well as balance program to improve presentation and daily mobility tolerance.   OBJECTIVE IMPAIRMENTS: decreased activity tolerance, decreased balance, decreased mobility, difficulty walking, decreased ROM, and decreased strength.   ACTIVITY LIMITATIONS: bending, sitting, standing, sleeping, stairs, and transfers  PARTICIPATION LIMITATIONS: community activity  PERSONAL FACTORS: 3+ comorbidities: see above pertinent history  are also affecting patient's functional outcome.   REHAB POTENTIAL: Good  CLINICAL DECISION MAKING: Stable/uncomplicated  EVALUATION COMPLEXITY: Low   GOALS: Goals reviewed with patient? Yes  SHORT TERM GOALS: (target date for Short term goals are 3 weeks 05/16/22)  1. Patient will demonstrate independent use of home exercise program to maintain progress from in clinic treatments.  Goal status: on going 04/30/2022  LONG TERM GOALS: (target dates for all long term goals are 10 weeks  07/04/22 )   1. Patient will demonstrate/report pain at worst less than or equal to 2/10 to facilitate minimal limitation in daily activity secondary to pain symptoms.  Goal status: New   2. Patient will demonstrate independent use of home exercise program to facilitate ability to maintain/progress functional gains from skilled physical therapy services.  Goal status: New   3. Patient will demonstrate FOTO outcome > or = 62 % to indicate reduced disability due to condition.  Goal status: New   4. Pt will be able to lift 4 pounds from counter to over head shelf without LOB and no pain reported in his low back.   Goal status: New   5.  Pt will be able to improve his 5 time sit to stand to </= 14 seconds with no UE support.  Goal status: New   6.  Perform BERG balance test and improve score by >/= 5 points from baseline to  decrease fall risk.  Goal status: New   PLAN:  PT FREQUENCY: 1-2x/week  PT DURATION: 10 weeks  PLANNED INTERVENTIONS: Therapeutic exercises, Therapeutic activity, Neuro Muscular re-education, Balance training, Gait training, Patient/Family education, Joint mobilization, Stair training, DME instructions, Dry Needling, Electrical stimulation, Cryotherapy, vasopneumatic device, Moist heat, Taping, Traction Ultrasound, Ionotophoresis 38m/ml Dexamethasone, and Manual therapy.  All included unless contraindicated  PLAN FOR NEXT SESSION:  Review HEP as necessary.  Include static balance holds and progression as tolerated.   MScot Jun PT, DPT, OCS, ATC 05/16/22  8:58 AM

## 2022-05-21 ENCOUNTER — Encounter: Payer: Medicare Other | Admitting: Physical Therapy

## 2022-05-21 ENCOUNTER — Other Ambulatory Visit: Payer: Self-pay | Admitting: *Deleted

## 2022-05-21 NOTE — Patient Outreach (Signed)
[place] Coordinator follow up. Mr. Stetter resides in [facility]. Screening for potential [place] care coordination services as benefit of health plan and PCP.   Collaboration with [place] work team and Brewing technologist. Mr. Tara currently has shingles. Under contact isolation. From home with wife. Per Marita [place], Mr. Bonawitz has caregiver support at home.   Will continue to follow for transition plans and potential [facility] care coordination needs.  Marthenia Rolling, MSN, RN,BSN [place] Acute Care Coordinator [phone] (Direct dial)

## 2022-05-28 ENCOUNTER — Encounter: Payer: Medicare Other | Admitting: Physical Therapy

## 2022-06-04 ENCOUNTER — Ambulatory Visit: Payer: Medicare Other | Admitting: Orthopedic Surgery

## 2022-06-09 ENCOUNTER — Other Ambulatory Visit: Payer: Self-pay | Admitting: *Deleted

## 2022-06-09 NOTE — Patient Outreach (Signed)
Per [place] [name] remains in [facility] skilled nursing facility. Screening for potential Lee Nelson care coordination services as benefit of health plan and Primary Care Provider.   Collaboration with Lee Nelson, Lee Nelson social worker. [name] plans to return home with spouse. Goal is to get him to a higher level of function before returning home.   Will continue to follow for potential [facility] care coordination needs.   Lee Rolling, MSN, Lee Nelson,BSN [place] Acute Care Coordinator [phone] (Direct dial)

## 2022-06-23 ENCOUNTER — Ambulatory Visit: Payer: Medicare Other | Admitting: Orthopedic Surgery

## 2022-07-02 ENCOUNTER — Other Ambulatory Visit: Payer: Self-pay | Admitting: *Deleted

## 2022-07-02 NOTE — Patient Outreach (Signed)
[facility] Post- Acute Care Coordinator follow up.  Mr. Bemiss discharged from [facility] on 06/24/22 for potential [facility] care coordination services as benefit of health plan and PCP.  Spoke with Marita [place] and [place] with [facility] social work department. Lee Nelson transitioned home with spouse. Has 24/7 caregiver assistance. [facility] home [facility] was arranged for PT/OT/ST/RN/aide. Wheelchair and shower bench was ordered.   No identifiable [facility] care coordination needs at this time.   Marthenia Rolling, MSN, RN,BSN [place] Acute Care Coordinator [phone] (Direct dial)

## 2022-07-11 ENCOUNTER — Encounter: Payer: Self-pay | Admitting: Physician Assistant

## 2022-08-06 ENCOUNTER — Ambulatory Visit: Payer: Medicare Other

## 2022-08-06 ENCOUNTER — Encounter: Payer: Self-pay | Admitting: Physician Assistant

## 2022-08-06 ENCOUNTER — Ambulatory Visit (INDEPENDENT_AMBULATORY_CARE_PROVIDER_SITE_OTHER): Payer: Medicare Other | Admitting: Physician Assistant

## 2022-08-06 VITALS — BP 106/52 | HR 67 | Resp 20 | Ht 68.0 in | Wt 118.8 lb

## 2022-08-06 DIAGNOSIS — R413 Other amnesia: Secondary | ICD-10-CM | POA: Diagnosis not present

## 2022-08-06 NOTE — Progress Notes (Signed)
Assessment/Plan:    The patient is seen in neurologic consultation at the request of Avva, Ravisankar, MD for the evaluation of memory.  Lee Nelson is a very delightful 82 y.o. year old RH male retired Pensions consultant and Social worker Professor at [employer], with a history of hypertension, hyperlipidemia, history of hospitalization in February 2024 for first COVID-pneumonia with severe sepsis hypoxic respiratory failure, history of NPH dating back to 2015, initially followed by [facility],  history of mild cognitive impairment diagnosed by [facility] in 2018, last seen in September 2022 and also seen at [facility], history of gait difficulty likely due to deconditioning, weight loss, cervical spinal stenosis, hip pain and arthritis per [facility] note treated conservatively, history of TIA (with right amaurosis fugax, May 2019) on aspirin daily, history of migraine with aura in the past followed by PCP, seen today for evaluation of memory loss. MRI brain 02/2022 personally reviewed, was remarkable for advanced atrophy and chronic SVD. Ventricles are enlarged in the setting of atrophy.  MoCA today is 25/30. Despite all the above history, patient is alert and eloquent. He needs assistance with ADLs and needs a walker to ambulate due to pain, but on exam he does not show any other signs of NPH (other than memory issues) such as magnetic feet excessive urination, tremors. Patient takes many natural products, including Lion's Mane, Neuriva and other OTC brain supplements, multivitamins etc..In view of advanced age, MRI findings, no recommendations for ACHI or NMDI is indicated and patient agrees.    Memory Impairment, multifactorial   Continue PT-ST-RN-aide with 24/7 caregiver assistance  Folllow up in  6 months  No indication for antidementia meds as the risk outweigh the benefits.   Will continue to follow for NPH and if symptoms were present, will consider large volume LP.  Recommend hearing aids for better  comprehension   Subjective:    The patient is accompanied by his caregiver  who supplements the history.    How long did patient have memory difficulties? HE began having some memory issues in 2015. Lately was seen at [facility] diagnosed with Mild Dementia, no meds were recommended. Sometimes Patient has some difficulty remembering recent conversations and people names repeats oneself?  Endorsed "by nature".  Disoriented when walking into a room?  Patient denies Leaving objects in unusual places?   denies   Wandering behavior? denies   Any personality changes since last visit? denies   Any history of depression?:  "Im not sure what the concept is everyone now and then has it". "Not taking medicine for it".  Hallucinations or paranoia?  denies   Seizures? denies    Any sleep changes?  I don't sleep very well. Denies  vivid dreams, REM behavior or sleepwalking   Sleep apnea? denies   Any hygiene concerns?  Denies but some people mentioned that I have not taken a shower in a few days"   Independent of bathing and dressing?  Endorsed Needs some assistance with dressing up. Does the patient need help with medications? Patient is in charge   Who is in charge of the finances?  "Wife is in charge of hers and I am in charge of mine"   Any changes in appetite?   denies     Trouble swallowing? He is on a soft diet    Any kitchen accidents such as leaving the stove on? Patient denies   Any headaches?  denies   Chronic back pain?  Endorsed, lumbar pain.  Ambulates with difficulty?  Uses a walker on a daily basis to prevent falls.  Recent falls or head injuries? "Maybe one" "My R leg and knee gave out" and " 1 weeks ago, and prior to that he was reaching he partially fell " No LOC No recent head injury  No recent vertigo or dizziness  Vision changes? Denies  Unilateral weakness, numbness or tingling?  denies   Any tremors?  denies   Any anosmia?  denies   Any incontinence of urine? "I don't think  so, but sometimes I have urge problems, I wear a pad". Any bowel dysfunction?  Recnet constipation  Patient lives wife and aide.  Wife has incomplete quadriplegia    History of heavy alcohol intake? denies   History of heavy tobacco use? denies   Family history of dementia?  Not that I know of. Brother had PD dementia  Does patient drive? Not anymore.   Extensive films ordered in the past by [facility] are as follows:     07/20/10 MRI CERVICAL 1. Left C6-C7 paracentral disc protrusion abutting the exiting left C7 nerve roots appears new. Preexisting mild left C7 foraminal stenosis at this level due to facet hypertrophy.  2. Mild combined congenital and acquired spinal stenosis at the C3-C4 and C4-C5.   11/20/10 MRI brain (without contrast) demonstrating: 1. Moderate ventriculomegaly in the temporal and occipital horns.  Likely due to subcortical atrophy. 2. No acute findings are seen.   11/20/10 MRA head (without contrast) demonstrating: - mild atheromatous irregularities in the bilateral carotid siphon regions.   11/19/10 EMG/NCS - normal   11/19/10 carotid u/s - normal   11/18/10 TTE - EF 55-60%; normal wall motion   09/19/13 MRI brain (with and without) demonstrating: 1. Moderate mesial temporal atrophy. 2. Moderate ventriculomegaly on ex vacuo basis. 3. No acute findings.   11/11/16 MRI brain [I reviewed images myself and agree with interpretation. -VRP]  1. No acute intracranial process. 2. Progressed nonspecific moderate to severe parenchymal brain volume loss for age. No hydrocephalus.   02/02/17 MRI cervical spine [I reviewed images myself and agree with interpretation. -VRP]  1. At C3-C4, there is borderline spinal stenosis due to ligamentum flavum hypertrophy and congenitally short pedicles.   There is no nerve root compression. 2. At C4-C5, there is mild spinal stenosis due to disc bulging, ligament of flavum hypertrophy, facet hypertrophy, mild uncovertebral spurring and  congenitally short pedicles. There is no nerve root compression. Degenerative changes at this level have slightly progressed compared to the 07/20/2010 MRI. 3. At C5-C6, there are degenerative changes no nerve root compression or spinal stenosis 4. At C6-C7, there are degenerative changes causing mild foraminal narrowing but no nerve root compression.   On the previous MRI there was a disc protrusion to the left that is not appreciated on the current study. 5. The spinal cord has normal signal.      08/11/17 CAROTID U/S Right Carotid: There was no evidence of thrombus, dissection, atherosclerotic plaque or stenosis in the cervical carotid system.  Left Carotid: Velocities in the left ICA are consistent with a 1-39% stenosis.  Vertebrals:  Bilateral vertebral arteries demonstrate antegrade flow.  Subclavians: Normal flow hemodynamics were seen in bilateral subclavian arteries.     01/29/18 MRI cervical spine - Mild degenerative changes in the cervical spine. Mild spinal and foraminal stenosis as above.   07/15/20 MRI lumbar spine - Negative for compression fracture. - Mild degenerative changes for age.  No neural compression.  Allergies  Allergen Reactions   Ciprofloxacin     JOINT PAIN   Flagyl [Metronidazole] Other (See Comments)    REACTION: no appetite, diarrhea after meal, decrease in weight   Metoclopramide Hcl Other (See Comments)    REACTION: "involuntary movements"   Other Other (See Comments)    Antibiotics have unknown reaction propophol causes memory problems   Propofol Other (See Comments)    Other Reaction(s): Other (See Comments)  Memory problems   Risperidone Other (See Comments)    "do not want"   Silodosin     ? Possibly allergy, could not breathe well out of nose   Soy Allergy Other (See Comments)    Stomach upset, "gas"  Other Reaction(s): Other (See Comments)   Sulfamethoxazole-Trimethoprim Other (See Comments)    REACTION: "involuntary  movements"  tripac antibiotic- heart rythm  Other Reaction(s): Other (See Comments)  Unknown reaction    Current Outpatient Medications  Medication Instructions   B Complex Vitamins (VITAMIN B COMPLEX) TABS 1 tablet, Oral, Daily   Cranberry 400-1,200 mg, Oral, Every morning   MAGNESIUM CITRATE PO 75 mg, Oral, Daily   melatonin 3 mg, Oral, Daily at bedtime   Menaquinone-7 (VITAMIN K2 PO) 120-240 mg, Oral, Daily, Combo w/ Vit. D   NON FORMULARY 1 capsule, Oral, Daily, Acetyl L carnitine    OVER THE COUNTER MEDICATION 3 capsules, Oral, 2 times daily, Lion's mane 0.5 gram/capsule    OVER THE COUNTER MEDICATION 2 capsules, Oral, Every morning, Reparagen supplement    Phosphatidylserine 100 mg, Oral, Every morning   Probiotic Product (PROBIOTIC DAILY PO) 1 capsule, Oral, Daily   Propylene Glycol (SYSTANE BALANCE) 0.6 % SOLN 1 drop, Ophthalmic, Daily PRN   senna-docusate (SENOKOT-S) 8.6-50 MG tablet 1 tablet, Oral, Daily at bedtime   sodium chloride (MURO 128) 5 % ophthalmic ointment 1 application , Both Eyes, Daily at bedtime   sodium chloride (MURO 128) 5 % ophthalmic solution See admin instructions   UNABLE TO FIND Med Name: cocovia memory   vitamin C (ASCORBIC ACID) 250 mg, Oral, Daily   Vitamin D-3 2,000 Units, Oral, Daily   zinc sulfate 220 mg, Oral, Daily     VITALS:   Vitals:   08/06/22 0945  BP: (!) 106/52  Pulse: 67  Resp: 20  SpO2: 97%  Weight: 118 lb 12.8 oz (53.9 kg)  Height: 5\' 8"  (1.727 m)       No data to display          PHYSICAL EXAM   HEENT:  Normocephalic, atraumatic. The mucous membranes are moist. The superficial temporal arteries are without ropiness or tenderness. Cardiovascular: Regular rate and rhythm. Lungs: Clear to auscultation bilaterally. Neck: There are no carotid bruits noted bilaterally.  NEUROLOGICAL:    08/06/2022   12:00 PM  Montreal Cognitive Assessment   Visuospatial/ Executive (0/5) 2  Naming (0/3) 3  Attention: Read list  of digits (0/2) 2  Attention: Read list of letters (0/1) 1  Attention: Serial 7 subtraction starting at 100 (0/3) 3  Language: Repeat phrase (0/2) 2  Language : Fluency (0/1) 1  Abstraction (0/2) 2  Delayed Recall (0/5) 4  Orientation (0/6) 5  Total 25  Adjusted Score (based on education) 25       12/11/2020    9:02 AM 06/04/2020   11:05 AM 05/03/2019    8:48 AM  MMSE - Mini Mental State Exam  Orientation to time 5 5 5   Orientation to Place 5 5 5  Registration 3 3 3   Attention/ Calculation 4 2 5   Recall 3 3 3   Language- name 2 objects 2 2 2   Language- repeat 1 1 1   Language- follow 3 step command 3 3 3   Language- read & follow direction 1 1 1   Write a sentence 1 1 1   Copy design 1 1 1   Total score 29 27 30      Orientation:  Alert and oriented to person, place and time. No aphasia or dysarthria. Fund of knowledge is appropriate. Recent memory impaired and remote memory intact.  Attention and concentration are normal.  Able to name objects and repeat phrases. Delayed recall  5/6 Cranial nerves: There is good facial symmetry. Extraocular muscles are intact and visual fields are full to confrontational testing. Speech is fluent and clear. no tongue deviation. Hearing is intact to conversational tone.  Tone: Tone is good throughout. Sensation: Sensation is intact to light touch and pinprick throughout. Vibration is intact at the bilateral big toe.There is no extinction with double simultaneous stimulation. There is no sensory dermatomal level identified. Coordination: The patient has no difficulty with RAM's or FNF bilaterally. Normal finger to nose  Motor: Strength is 5/5 in the bilateral upper and lower extremities. There is no pronator drift. There are no fasciculations noted. DTR's: Deep tendon reflexes are 2/4 at the bilateral biceps, triceps, brachioradialis, patella and achilles.  Plantar responses are downgoing bilaterally. Gait and Station: The patient is able to ambulate without  difficulty but needs a walker for stability, gait cautious and narrow, steps are short.      Thank you for allowing [place] the opportunity to participate in the care of this nice patient. Please do not hesitate to contact us for any questions or concerns.   Total time spent on today's visit was 60 minutes dedicated to this patient today, preparing to see patient, examining the patient, ordering tests and/or medications and counseling the patient, documenting clinical information in the EHR or other health record, independently interpreting results and communicating results to the patient/family, discussing treatment and goals, answering patient's questions and coordinating care.  Cc:  Chilton Greathouse, MD  Marlowe Kays 08/06/2022 12:24 PM

## 2022-08-06 NOTE — Patient Instructions (Signed)
It was a pleasure to see you today at our office.   Recommendations:   Follow up in 6 months   For assessment of decision of mental capacity and competency:  Call Dr. Erick Blinks, geriatric psychiatrist at [phone] Counseling regarding caregiver distress, including caregiver depression, anxiety and issues regarding community resources, adult day care programs, adult living facilities, or memory care questions:  please contact your  Primary Doctor's Social Worker  Whom to call: Memory  decline, memory medications: Call our office [phone]  For psychiatric meds, mood meds: Please have your primary care physician manage these medications.  If you have any severe symptoms of a stroke, or other severe issues such as confusion,severe chills or fever, etc call 911 or go to the ER as you may need to be evaluated further    RECOMMENDATIONS FOR ALL PATIENTS WITH MEMORY PROBLEMS: 1. Continue to exercise (Recommend 30 minutes of walking everyday, or 3 hours every week) 2. Increase social interactions - continue going to [place] and enjoy social gatherings with friends and family 3. Eat healthy, avoid fried foods and eat more fruits and vegetables 4. Maintain adequate blood pressure, blood sugar, and blood cholesterol level. Reducing the risk of stroke and cardiovascular disease also helps promoting better memory. 5. Avoid stressful situations. Live a simple life and avoid aggravations. Organize your time and prepare for the next day in anticipation. 6. Sleep well, avoid any interruptions of sleep and avoid any distractions in the bedroom that may interfere with adequate sleep quality 7. Avoid sugar, avoid sweets as there is a strong link between excessive sugar intake, diabetes, and cognitive impairment We discussed the Mediterranean diet, which has been shown to help patients reduce the risk of progressive memory disorders and reduces cardiovascular risk. This includes eating fish, eat fruits  and green leafy vegetables, nuts like almonds and hazelnuts, walnuts, and also use olive oil. Avoid fast foods and fried foods as much as possible. Avoid sweets and sugar as sugar use has been linked to worsening of memory function.  There is always a concern of gradual progression of memory problems. If this is the case, then we may need to adjust level of care according to patient needs. Support, both to the patient and caregiver, should then be put into place.     FALL PRECAUTIONS: Be cautious when walking. Scan the area for obstacles that may increase the risk of trips and falls. When getting up in the mornings, sit up at the edge of the bed for a few minutes before getting out of bed. Consider elevating the bed at the head end to avoid drop of blood pressure when getting up. Walk always in a well-lit room (use night lights in the walls). Avoid area rugs or power cords from appliances in the middle of the walkways. Use a walker or a cane if necessary and consider physical therapy for balance exercise. Get your eyesight checked regularly.  FINANCIAL OVERSIGHT: Supervision, especially oversight when making financial decisions or transactions is also recommended.  HOME SAFETY: Consider the safety of the kitchen when operating appliances like stoves, microwave oven, and blender. Consider having supervision and share cooking responsibilities until no longer able to participate in those. Accidents with firearms and other hazards in the house should be identified and addressed as well.   ABILITY TO BE LEFT ALONE: If patient is unable to contact 911 operator, consider using LifeLine, or when the need is there, arrange for someone to stay with patients. Smoking is  a fire hazard, consider supervision or cessation. Risk of wandering should be assessed by caregiver and if detected at any point, supervision and safe proof recommendations should be instituted.  MEDICATION SUPERVISION: Inability to  self-administer medication needs to be constantly addressed. Implement a mechanism to ensure safe administration of the medications.   DRIVING: Regarding driving, in patients with progressive memory problems, driving will be impaired. We advise to have someone else do the driving if trouble finding directions or if minor accidents are reported. Independent driving assessment is available to determine safety of driving.   If you are interested in the driving assessment, you can contact the following:  [employer] [facility] in [place] [phone]  [facility] [phone]  [facility] [phone] or [phone]    Mediterranean Diet A Mediterranean diet refers to food and lifestyle choices that are based on the traditions of countries located on the [employer]. This way of eating has been shown to help prevent certain conditions and improve outcomes for people who have chronic diseases, like kidney disease and heart disease. What are tips for following this plan? Lifestyle  Cook and eat meals together with your family, when possible. Drink enough fluid to keep your urine clear or pale yellow. Be physically active every day. This includes: Aerobic exercise like running or swimming. Leisure activities like gardening, walking, or housework. Get 7-8 hours of sleep each night. If recommended by your health care provider, drink red wine in moderation. This means 1 glass a day for nonpregnant women and 2 glasses a day for men. A glass of wine equals 5 oz (150 mL). Reading food labels  Check the serving size of packaged foods. For foods such as rice and pasta, the serving size refers to the amount of cooked product, not dry. Check the total fat in packaged foods. Avoid foods that have saturated fat or trans fats. Check the ingredients list for added sugars, such as corn syrup. Shopping  At the grocery store, buy most  of your food from the areas near the walls of the store. This includes: Fresh fruits and vegetables (produce). Grains, beans, nuts, and seeds. Some of these may be available in unpackaged forms or large amounts (in bulk). Fresh seafood. Poultry and eggs. Low-fat dairy products. Buy whole ingredients instead of prepackaged foods. Buy fresh fruits and vegetables in-season from local farmers markets. Buy frozen fruits and vegetables in resealable bags. If you do not have access to quality fresh seafood, buy precooked frozen shrimp or canned fish, such as tuna, salmon, or sardines. Buy small amounts of raw or cooked vegetables, salads, or olives from the deli or salad bar at your store. Stock your pantry so you always have certain foods on hand, such as olive oil, canned tuna, canned tomatoes, rice, pasta, and beans. Cooking  Cook foods with extra-virgin olive oil instead of using butter or other vegetable oils. Have meat as a side dish, and have vegetables or grains as your main dish. This means having meat in small portions or adding small amounts of meat to foods like pasta or stew. Use beans or vegetables instead of meat in common dishes like chili or lasagna. Experiment with different cooking methods. Try roasting or broiling vegetables instead of steaming or sauteing them. Add frozen vegetables to soups, stews, pasta, or rice. Add nuts or seeds for added healthy fat at each meal. You can add these to yogurt, salads, or vegetable dishes. Marinate fish or vegetables  using olive oil, lemon juice, garlic, and fresh herbs. Meal planning  Plan to eat 1 vegetarian meal one day each week. Try to work up to 2 vegetarian meals, if possible. Eat seafood 2 or more times a week. Have healthy snacks readily available, such as: Vegetable sticks with hummus. Greek yogurt. Fruit and nut trail mix. Eat balanced meals throughout the week. This includes: Fruit: 2-3 servings a day Vegetables: 4-5 servings  a day Low-fat dairy: 2 servings a day Fish, poultry, or lean meat: 1 serving a day Beans and legumes: 2 or more servings a week Nuts and seeds: 1-2 servings a day Whole grains: 6-8 servings a day Extra-virgin olive oil: 3-4 servings a day Limit red meat and sweets to only a few servings a month What are my food choices? Mediterranean diet Recommended Grains: Whole-grain pasta. Brown rice. Bulgar wheat. Polenta. Couscous. Whole-wheat bread. Orpah Cobb. Vegetables: Artichokes. Beets. Broccoli. Cabbage. Carrots. Eggplant. Green beans. Chard. Kale. Spinach. Onions. Leeks. Peas. Squash. Tomatoes. Peppers. Radishes. Fruits: Apples. Apricots. Avocado. Berries. Bananas. Cherries. Dates. Figs. Grapes. Lemons. Melon. Oranges. Peaches. Plums. Pomegranate. Meats and other protein foods: Beans. Almonds. Sunflower seeds. Pine nuts. Peanuts. Cod. Salmon. Scallops. Shrimp. Tuna. Tilapia. Clams. Oysters. Eggs. Dairy: Low-fat milk. Cheese. Greek yogurt. Beverages: Water. Red wine. Herbal tea. Fats and oils: Extra virgin olive oil. Avocado oil. Grape seed oil. Sweets and desserts: [place] yogurt with honey. Baked apples. Poached pears. Trail mix. Seasoning and other foods: Basil. Cilantro. Coriander. Cumin. Mint. Parsley. Sage. Rosemary. Tarragon. Garlic. Oregano. Thyme. Pepper. Balsalmic vinegar. Tahini. Hummus. Tomato sauce. Olives. Mushrooms. Limit these Grains: Prepackaged pasta or rice dishes. Prepackaged cereal with added sugar. Vegetables: Deep fried potatoes (french fries). Fruits: Fruit canned in syrup. Meats and other protein foods: Beef. Pork. Lamb. Poultry with skin. Hot dogs. Tomasa Blase. Dairy: Ice cream. Sour cream. Whole milk. Beverages: Juice. Sugar-sweetened soft drinks. Beer. Liquor and spirits. Fats and oils: Butter. Canola oil. Vegetable oil. Beef fat (tallow). Lard. Sweets and desserts: Cookies. Cakes. Pies. Candy. Seasoning and other foods: Mayonnaise. Premade sauces and marinades. The  items listed may not be a complete list. Talk with your dietitian about what dietary choices are right for you. Summary The Mediterranean diet includes both food and lifestyle choices. Eat a variety of fresh fruits and vegetables, beans, nuts, seeds, and whole grains. Limit the amount of red meat and sweets that you eat. Talk with your health care provider about whether it is safe for you to drink red wine in moderation. This means 1 glass a day for nonpregnant women and 2 glasses a day for men. A glass of wine equals 5 oz (150 mL). This information is not intended to replace advice given to you by your health care provider. Make sure you discuss any questions you have with your health care provider. Document Released: 11/08/2015 Document Revised: 12/11/2015 Document Reviewed: 11/08/2015 Elsevier Interactive Patient Education  2017 [employer].

## 2022-08-07 ENCOUNTER — Telehealth: Payer: Self-pay | Admitting: Physician Assistant

## 2022-08-07 NOTE — Telephone Encounter (Signed)
Pt wants the results of his memory test that he had done yesterday and wanted to make sure that it was noted in the chart. He wants a copy of the test results but not the questions   please call

## 2022-08-07 NOTE — Telephone Encounter (Signed)
Patient advised and thanked me for calling  

## 2022-08-08 ENCOUNTER — Ambulatory Visit (INDEPENDENT_AMBULATORY_CARE_PROVIDER_SITE_OTHER): Payer: Medicare Other | Admitting: Surgical

## 2022-08-08 ENCOUNTER — Other Ambulatory Visit (INDEPENDENT_AMBULATORY_CARE_PROVIDER_SITE_OTHER): Payer: Medicare Other

## 2022-08-08 ENCOUNTER — Telehealth: Payer: Self-pay

## 2022-08-08 DIAGNOSIS — M25561 Pain in right knee: Secondary | ICD-10-CM

## 2022-08-08 DIAGNOSIS — M25551 Pain in right hip: Secondary | ICD-10-CM

## 2022-08-08 NOTE — Telephone Encounter (Signed)
Auth needed right knee gel

## 2022-08-09 ENCOUNTER — Encounter: Payer: Self-pay | Admitting: Surgical

## 2022-08-09 NOTE — Progress Notes (Signed)
Office Visit Note   Patient: Lee Nelson           Date of Birth: 11/19/1940           MRN: [id number] Visit Date: 08/08/2022 Requested by: Chilton Greathouse, MD [address] [place],  [place] [address] PCP: Chilton Greathouse, MD  Subjective: Chief Complaint  Patient presents with   Right Knee - Pain    HPI: Lee Nelson is a 82 y.o. male who presents to the office reporting right knee and right hip pain.  Patient states that he had a fall in December and had COVID and shingles and had to stay in a skilled nursing facility for about a month.  He is now back home.  He states about 2 to 3 weeks ago, he was bending over and felt sudden severe pain in his right groin and right knee.  He was able to weight-bear after couple minutes and he has been ambulatory with a walker (which is typical for him).  States that overall pain is improved to the point where he considered not coming to this appointment but this morning he did have some recurrence of the groin pain.  He has radiation of pain down the anterior thigh into the knee.  Pain does not wake him up at night except for occasional knee pain at night that keeps him from going to sleep but does not wake him up.  Using topical Voltaren and taking Tylenol for pain control.  No history of prior surgery to the hip or knee.  No mechanical symptoms.  No fevers or chills.  Pain is worse with bending over primarily.  No worsening of his typical low back pain or buttock pain.  He does have history of insufficiency fracture of the sacrum multiple times in the past..                ROS: All systems reviewed are negative as they relate to the chief complaint within the history of present illness.  Patient denies fevers or chills.  Assessment & Plan: Visit Diagnoses:  1. Right knee pain, unspecified chronicity   2. Pain in right hip     Plan: Patient is a 82 year old male who presents for evaluation of right knee and right hip pain.  Had onset of  pain 2 to 3 weeks ago after bending over and feeling stabbing pain in the groin.  Has significantly improved but did have a little bit of recurrence of pain this morning.  He has radiographs taken today demonstrating no fracture but he does have moderate degenerative changes of the right hip joint which has been noted in the past on prior MRI scans.  He also has chondrocalcinosis of the right knee.  Seems most of his pain is coming from the hip with pain worse with hip flexion and bending over.  We did discuss options available to patient.  He would like to hold off on cortisone injection for now and to either the hip or knee but he would like to try gel injection in the right knee.  Will get him approved for this.  I will also reach out to his home health physical therapist at [facility] in order to let him know that he is dealing with some symptomatic hip arthritis.  This patient is diagnosed with osteoarthritis of the knee(s).    Radiographs show evidence of joint space narrowing, osteophytes, subchondral sclerosis and/or subchondral cysts.  This patient has knee pain which interferes  with functional and activities of daily living.    This patient has experienced inadequate response, adverse effects and/or intolerance with conservative treatments such as acetaminophen, NSAIDS, topical creams, physical therapy or regular exercise, knee bracing and/or weight loss.   This patient has experienced inadequate response or has a contraindication to intra articular steroid injections for at least 3 months.   This patient is not scheduled to have a total knee replacement within 6 months of starting treatment with viscosupplementation.   Follow-Up Instructions: No follow-ups on file.   Orders:  Orders Placed This Encounter  Procedures   XR KNEE 3 VIEW RIGHT   XR HIP UNILAT W OR W/O PELVIS 2-3 VIEWS RIGHT   No orders of the defined types were placed in this encounter.     Procedures: No procedures  performed   Clinical Data: No additional findings.  Objective: Vital Signs: There were no vitals taken for this visit.  Physical Exam:  Constitutional: Patient appears well-developed HEENT:  Head: Normocephalic Eyes:EOM are normal Neck: Normal range of motion Cardiovascular: Normal rate Pulmonary/chest: Effort normal Neurologic: Patient is alert Skin: Skin is warm Psychiatric: Patient has normal mood and affect  Ortho Exam: Ortho exam demonstrates right knee with trace effusion.  Tenderness over the medial joint line mildly with no tenderness over the lateral joint line.  He has about 3 degrees extension and 115 degrees knee flexion.  No calf tenderness.  Negative Homans' sign.  He is able to perform straight leg raise without extensor lag.  Straight leg raise against resistance does reproduce his groin pain.  Positive Stinchfield sign.  Positive FADIR sign.  He has reduced passive internal rotation of the right hip relative to the left hip by about 15 to 20 degrees.  No tenderness over the trochanter.  He ambulates without Trendelenburg gait.  Specialty Comments:  No specialty comments available.  Imaging: No results found.   PMFS History: Patient Active Problem List   Diagnosis Date Noted   Memory impairment 08/06/2022   Pneumonia due to COVID-19 virus 05/06/2022   Severe sepsis (HCC) 05/06/2022   Acute hypoxemic respiratory failure (HCC) 05/06/2022   NPH (normal pressure hydrocephalus) (HCC) 05/06/2022   Physical deconditioning 05/06/2022   Thrombocytopenia (HCC) 05/06/2022   Dementia (HCC) 05/06/2022   SBO (small bowel obstruction) (HCC) 12/31/2019   Amaurosis fugax of right eye 08/25/2017   PSVT (paroxysmal supraventricular tachycardia) 09/12/2015   Chest pain 05/03/2015   Dyspnea 05/03/2015   ALLERGIC RHINITIS 09/21/2007   G E R D 09/21/2007   SMOKE INHALATION 09/21/2007   OSTEOPOROSIS 01/28/2007   PALPITATIONS 01/28/2007   COUGH 01/28/2007   ALLERGY  01/28/2007   Past Medical History:  Diagnosis Date   Anxiety    Arthritis    Bladder calculus    BPH (benign prostatic hyperplasia)    Chronic constipation    Complication of anesthesia    " I had some coughing afterwards for a couple of days"--  per pt "perfers spinal anesthesia since general anesthesia congnitive issues when older"   Diverticulosis of colon    Dry eye syndrome of both eyes    Environmental allergies    GERD (gastroesophageal reflux disease)    occasional   History of adenomatous polyp of colon    08/ 2004   History of kidney stones    History of squamous cell carcinoma in situ (SCCIS) of skin    s/p  excision 2013 facial areas and 06/ 2016 nose   Migraine  eye migraine occasional   Seasonal and perennial allergic rhinitis    Thrombocytopenia (HCC)    Tingling    hands and feet bilat , intermittantly-- per pt has lumbar bulging disk   Urinary frequency    Vocal fold atrophy    dysphonia-- per pt has to drink large amount of water to take even on pill   Wears glasses     Family History  Problem Relation Age of Onset   Parkinsonism Brother    COPD Mother    Allergies Mother    Heart failure Mother    COPD Father    Stroke Father    Other Sister    Suicidality Maternal Aunt    Cancer Maternal Grandfather     Past Surgical History:  Procedure Laterality Date   APPENDECTOMY  child   CARDIOVASCULAR STRESS TEST  05-17-2015  dr hilty   Low risk nuclear study w/ no ischemia/  normal LV function and wall motion , stress ef 54% (lvef 45-54%)   CHOLECYSTECTOMY N/A 09/29/2014   Procedure: LAPAROSCOPIC CHOLECYSTECTOMY WITH INTRAOPERATIVE CHOLANGIOGRAM;  Surgeon: Ovidio Kin, MD;  Location: [facility];  Service: General;  Laterality: N/A;   COLONOSCOPY  last one 09-06-2010   ESOPHAGOGASTRODUODENOSCOPY N/A 12/09/2013   Procedure: ESOPHAGOGASTRODUODENOSCOPY (EGD);  Surgeon: Willis Modena, MD;  Location: [facility] ENDOSCOPY;  Service: Endoscopy;  Laterality: N/A;    EXTRACORPOREAL SHOCK WAVE LITHOTRIPSY  yrs ago   INGUINAL HERNIA REPAIR Left child   inguinal hernia repair  09/2017   NISSEN FUNDOPLICATION  1980's   open   STONE EXTRACTION WITH BASKET N/A 08/18/2016   Procedure: STONE EXTRACTION WITH BASKET;  Surgeon: Jethro Bolus, MD;  Location: [facility];  Service: Urology;  Laterality: N/A;   THULIUM LASER TURP (TRANSURETHRAL RESECTION OF PROSTATE) N/A 08/18/2016   Procedure: THULIUM LASER BLADDER NECK INCISION AND BLADDER STONE REMOVAL;  Surgeon: Jethro Bolus, MD;  Location: [facility];  Service: Urology;  Laterality: N/A;   TONSILLECTOMY  child   TRANSTHORACIC ECHOCARDIOGRAM  11/18/2010   grade 1 diastolic dysfunction, ef 55-60%/  trivial MR and TR/ mild dilated RA   Social History   Occupational History   Occupation: teaches constitutional law   Occupation: Photographer: [employer]  Tobacco Use   Smoking status: Never   Smokeless tobacco: Never  Substance and Sexual Activity   Alcohol use: Yes    Comment: social   Drug use: No   Sexual activity: Not on file

## 2022-08-11 NOTE — Telephone Encounter (Signed)
VOB submitted for Monovisc, right knee  

## 2022-09-06 ENCOUNTER — Inpatient Hospital Stay (HOSPITAL_COMMUNITY)
Admission: EM | Admit: 2022-09-06 | Discharge: 2022-09-10 | DRG: 521 | Disposition: A | Discharge status: Home Health | Payer: Medicare Other | Attending: Internal Medicine | Admitting: Internal Medicine

## 2022-09-06 ENCOUNTER — Other Ambulatory Visit: Payer: Self-pay

## 2022-09-06 ENCOUNTER — Encounter (HOSPITAL_COMMUNITY): Payer: Self-pay

## 2022-09-06 ENCOUNTER — Emergency Department (HOSPITAL_COMMUNITY): Payer: Medicare Other

## 2022-09-06 DIAGNOSIS — Z85828 Personal history of other malignant neoplasm of skin: Secondary | ICD-10-CM

## 2022-09-06 DIAGNOSIS — K219 Gastro-esophageal reflux disease without esophagitis: Secondary | ICD-10-CM | POA: Diagnosis present

## 2022-09-06 DIAGNOSIS — Z888 Allergy status to other drugs, medicaments and biological substances status: Secondary | ICD-10-CM

## 2022-09-06 DIAGNOSIS — Z825 Family history of asthma and other chronic lower respiratory diseases: Secondary | ICD-10-CM

## 2022-09-06 DIAGNOSIS — S72001A Fracture of unspecified part of neck of right femur, initial encounter for closed fracture: Principal | ICD-10-CM

## 2022-09-06 DIAGNOSIS — Z9079 Acquired absence of other genital organ(s): Secondary | ICD-10-CM

## 2022-09-06 DIAGNOSIS — K5909 Other constipation: Secondary | ICD-10-CM | POA: Diagnosis present

## 2022-09-06 DIAGNOSIS — S7291XA Unspecified fracture of right femur, initial encounter for closed fracture: Secondary | ICD-10-CM | POA: Diagnosis present

## 2022-09-06 DIAGNOSIS — Z681 Body mass index (BMI) 19 or less, adult: Secondary | ICD-10-CM

## 2022-09-06 DIAGNOSIS — Z87442 Personal history of urinary calculi: Secondary | ICD-10-CM

## 2022-09-06 DIAGNOSIS — D72829 Elevated white blood cell count, unspecified: Secondary | ICD-10-CM | POA: Diagnosis present

## 2022-09-06 DIAGNOSIS — Z8249 Family history of ischemic heart disease and other diseases of the circulatory system: Secondary | ICD-10-CM

## 2022-09-06 DIAGNOSIS — S72011A Unspecified intracapsular fracture of right femur, initial encounter for closed fracture: Secondary | ICD-10-CM | POA: Diagnosis not present

## 2022-09-06 DIAGNOSIS — F419 Anxiety disorder, unspecified: Secondary | ICD-10-CM | POA: Diagnosis present

## 2022-09-06 DIAGNOSIS — I471 Supraventricular tachycardia, unspecified: Secondary | ICD-10-CM | POA: Diagnosis present

## 2022-09-06 DIAGNOSIS — W19XXXA Unspecified fall, initial encounter: Secondary | ICD-10-CM

## 2022-09-06 DIAGNOSIS — Z882 Allergy status to sulfonamides status: Secondary | ICD-10-CM

## 2022-09-06 DIAGNOSIS — Z79899 Other long term (current) drug therapy: Secondary | ICD-10-CM

## 2022-09-06 DIAGNOSIS — I4719 Other supraventricular tachycardia: Secondary | ICD-10-CM | POA: Diagnosis present

## 2022-09-06 DIAGNOSIS — Z79890 Hormone replacement therapy (postmenopausal): Secondary | ICD-10-CM

## 2022-09-06 DIAGNOSIS — W010XXA Fall on same level from slipping, tripping and stumbling without subsequent striking against object, initial encounter: Secondary | ICD-10-CM | POA: Diagnosis present

## 2022-09-06 DIAGNOSIS — D696 Thrombocytopenia, unspecified: Secondary | ICD-10-CM | POA: Diagnosis present

## 2022-09-06 DIAGNOSIS — E43 Unspecified severe protein-calorie malnutrition: Secondary | ICD-10-CM | POA: Insufficient documentation

## 2022-09-06 DIAGNOSIS — N400 Enlarged prostate without lower urinary tract symptoms: Secondary | ICD-10-CM | POA: Diagnosis present

## 2022-09-06 DIAGNOSIS — M810 Age-related osteoporosis without current pathological fracture: Secondary | ICD-10-CM | POA: Diagnosis present

## 2022-09-06 DIAGNOSIS — Z86010 Personal history of colonic polyps: Secondary | ICD-10-CM

## 2022-09-06 DIAGNOSIS — Z881 Allergy status to other antibiotic agents status: Secondary | ICD-10-CM

## 2022-09-06 DIAGNOSIS — F0390 Unspecified dementia without behavioral disturbance: Secondary | ICD-10-CM | POA: Diagnosis present

## 2022-09-06 DIAGNOSIS — M1990 Unspecified osteoarthritis, unspecified site: Secondary | ICD-10-CM | POA: Diagnosis present

## 2022-09-06 DIAGNOSIS — S72009A Fracture of unspecified part of neck of unspecified femur, initial encounter for closed fracture: Secondary | ICD-10-CM | POA: Diagnosis not present

## 2022-09-06 DIAGNOSIS — Y92009 Unspecified place in unspecified non-institutional (private) residence as the place of occurrence of the external cause: Secondary | ICD-10-CM

## 2022-09-06 DIAGNOSIS — Z8616 Personal history of COVID-19: Secondary | ICD-10-CM

## 2022-09-06 DIAGNOSIS — Z9049 Acquired absence of other specified parts of digestive tract: Secondary | ICD-10-CM

## 2022-09-06 DIAGNOSIS — Z823 Family history of stroke: Secondary | ICD-10-CM

## 2022-09-06 DIAGNOSIS — N401 Enlarged prostate with lower urinary tract symptoms: Secondary | ICD-10-CM | POA: Diagnosis present

## 2022-09-06 MED ORDER — ONDANSETRON HCL 4 MG/2ML IJ SOLN
4.0000 mg | Freq: Once | INTRAMUSCULAR | Status: AC
  Administered 2022-09-07: 4 mg via INTRAVENOUS
  Filled 2022-09-06: qty 2

## 2022-09-06 MED ORDER — FENTANYL CITRATE PF 50 MCG/ML IJ SOSY
50.0000 ug | PREFILLED_SYRINGE | Freq: Once | INTRAMUSCULAR | Status: AC
  Administered 2022-09-07: 50 ug via INTRAVENOUS
  Filled 2022-09-06: qty 1

## 2022-09-06 MED ORDER — METHOCARBAMOL 500 MG PO TABS
500.0000 mg | ORAL_TABLET | Freq: Once | ORAL | Status: AC
  Administered 2022-09-07: 500 mg via ORAL
  Filled 2022-09-06: qty 1

## 2022-09-06 NOTE — ED Provider Notes (Signed)
[place] EMERGENCY DEPARTMENT AT [facility] Provider Note   CSN: [id number] Arrival date & time: 09/06/22  2207     History {Add pertinent medical, surgical, social history, OB history to HPI:1} Chief Complaint  Patient presents with   Fall   Weakness   Hip Pain    Pt reports severe pain in right hip s/p fall approx. 2100 tonight while ambulating with a cane. Pt reports he has been having generalized weakness and that his legs just "went out on me."     Lee Nelson is a 82 y.o. male.  The history is provided by the patient and medical records.  Fall  Weakness Hip Pain  Lee Nelson is a 82 y.o. male who presents to the Emergency Department complaining of fall.  He presents to the emergency department by EMS for evaluation of injuries following a fall that occurred at home.  He states that he was turning from a dresser and he lost his balance and fell forward, landing directly on his right hip.  He was unable to stop the fall.  No head injury or loss of consciousness.  He has had some pain in this right hip previously but now has severe pain to the right hip and pelvis.  He also has associated muscle spasms in this region.  No reported recent illnesses.  He was recently discharged from rehab in March following hospitalization for shingles and COVID.  He currently lives at home with his wife, who is a quadriplegic and they have home health aides that assist in their care.        Home Medications Prior to Admission medications   Medication Sig Start Date End Date Taking? Authorizing Provider  B Complex Vitamins (VITAMIN B COMPLEX) TABS Take 1 tablet by mouth daily.    [provider]  Cholecalciferol (VITAMIN D-3) 1000 units CAPS Take 2,000 Units by mouth daily.     [provider]  Cranberry 400 MG CAPS Take 400-1,200 mg by mouth every morning.     [provider]  MAGNESIUM CITRATE PO Take 75 mg by mouth daily.    [provider]  melatonin 3 MG TABS tablet Take 1 tablet (3 mg total) by mouth at bedtime. 05/15/22   Rhetta Mura, MD  Menaquinone-7 (VITAMIN K2 PO) Take 120-240 mg by mouth daily. Combo w/ Vit. D    [provider]  NON FORMULARY Take 1 capsule by mouth daily. Acetyl L carnitine    [provider]  OVER THE COUNTER MEDICATION Take 3 capsules by mouth 2 (two) times daily. Lion's mane 0.5 gram/capsule    [provider]  OVER THE COUNTER MEDICATION Take 2 capsules by mouth every morning. Reparagen supplement    [provider]  Phosphatidylserine 100 MG CAPS Take 100 mg by mouth every morning.     [provider]  Probiotic Product (PROBIOTIC DAILY PO) Take 1 capsule by mouth daily.     [provider]  Propylene Glycol (SYSTANE BALANCE) 0.6 % SOLN Apply 1 drop to eye daily as needed (dry eyes).     [provider]  senna-docusate (SENOKOT-S) 8.6-50 MG tablet Take 1 tablet by mouth at bedtime. 05/15/22   Rhetta Mura, MD  sodium chloride (MURO 128) 5 % ophthalmic ointment Place 1 application into both eyes at bedtime.     [provider]  sodium chloride (MURO 128) 5 % ophthalmic solution See admin instructions. 09/01/20   [provider]  UNABLE TO FIND Med Name: cocovia memory    [provider]  vitamin C (ASCORBIC ACID) 250 MG tablet Take 250 mg by mouth daily.    [provider]  zinc sulfate 220 (50 Zn) MG capsule Take 1 capsule (220 mg total) by mouth daily. 05/15/22   Rhetta Mura, MD      Allergies    Ciprofloxacin, Flagyl [metronidazole], Metoclopramide hcl, Other, Propofol, Risperidone, Silodosin, Soy allergy, and Sulfamethoxazole-trimethoprim    Review of Systems   Review of Systems  Neurological:  Positive for weakness.  All other systems reviewed and are negative.   Physical Exam Updated Vital Signs BP (!) 139/52 (BP Location: Left Arm)   Pulse 80   Temp  97.9 F (36.6 C) (Oral)   Resp (!) 22   Ht 5\' 10"  (1.778 m)   Wt 51.3 kg   SpO2 93%   BMI 16.21 kg/m  Physical Exam Vitals and nursing note reviewed.  Constitutional:      Appearance: He is well-developed.  HENT:     Head: Normocephalic and atraumatic.  Cardiovascular:     Rate and Rhythm: Normal rate and regular rhythm.     Heart sounds: No murmur heard. Pulmonary:     Effort: Pulmonary effort is normal. No respiratory distress.     Breath sounds: Normal breath sounds.  Abdominal:     Palpations: Abdomen is soft.     Tenderness: There is no abdominal tenderness. There is no guarding or rebound.  Musculoskeletal:     Cervical back: Neck supple.     Comments: 2+ DP pulses bilaterally.  Mild tenderness to right hip with exquisite pain on minimal ROM.    Skin:    General: Skin is warm and dry.  Neurological:     Mental Status: He is alert and oriented to person, place, and time.  Psychiatric:        Behavior: Behavior normal.     ED Results / Procedures / Treatments   Labs (all labs ordered are listed, but only abnormal results are displayed) Labs Reviewed - No data to display  EKG None  Radiology No results found.  Procedures Procedures  {Document cardiac monitor, telemetry assessment procedure when appropriate:1}  Medications Ordered in ED Medications - No data to display  ED Course/ Medical Decision Making/ A&P   {   Click here for ABCD2, HEART and other calculatorsREFRESH Note before signing :1}                          Medical Decision Making Amount and/or Complexity of Data Reviewed Labs: ordered. Radiology: ordered.  Risk Prescription drug management.   ***  {Document critical care time when appropriate:1} {Document review of labs and clinical decision tools ie heart score, Chads2Vasc2 etc:1}  {Document your independent review of radiology images, and any outside records:1} {Document your discussion with family members, caretakers, and with  consultants:1} {Document social determinants of health affecting pt's care:1} {Document your decision making why or why not admission, treatments were needed:1} Final Clinical Impression(s) / ED Diagnoses Final diagnoses:  None    Rx / DC Orders ED Discharge Orders     None

## 2022-09-06 NOTE — ED Triage Notes (Signed)
Pt from home. Has some dementia at baseline. Pt does deny headstrike, denies thinners, denies loc. Alert and oriented to person, place and situation, confused to some details of time.

## 2022-09-07 ENCOUNTER — Inpatient Hospital Stay (HOSPITAL_COMMUNITY): Payer: Medicare Other | Admitting: Anesthesiology

## 2022-09-07 ENCOUNTER — Encounter (HOSPITAL_COMMUNITY): Payer: Self-pay | Admitting: Internal Medicine

## 2022-09-07 ENCOUNTER — Encounter (HOSPITAL_COMMUNITY)
Admission: EM | Disposition: A | Discharge status: Home Health | Payer: Self-pay | Source: Home / Self Care | Attending: Internal Medicine

## 2022-09-07 ENCOUNTER — Other Ambulatory Visit: Payer: Self-pay

## 2022-09-07 ENCOUNTER — Inpatient Hospital Stay (HOSPITAL_COMMUNITY): Payer: Medicare Other

## 2022-09-07 ENCOUNTER — Emergency Department (HOSPITAL_COMMUNITY): Payer: Medicare Other

## 2022-09-07 DIAGNOSIS — W19XXXA Unspecified fall, initial encounter: Secondary | ICD-10-CM | POA: Diagnosis not present

## 2022-09-07 DIAGNOSIS — K219 Gastro-esophageal reflux disease without esophagitis: Secondary | ICD-10-CM | POA: Diagnosis present

## 2022-09-07 DIAGNOSIS — E43 Unspecified severe protein-calorie malnutrition: Secondary | ICD-10-CM | POA: Diagnosis not present

## 2022-09-07 DIAGNOSIS — Z888 Allergy status to other drugs, medicaments and biological substances status: Secondary | ICD-10-CM | POA: Diagnosis not present

## 2022-09-07 DIAGNOSIS — F419 Anxiety disorder, unspecified: Secondary | ICD-10-CM

## 2022-09-07 DIAGNOSIS — K5909 Other constipation: Secondary | ICD-10-CM | POA: Diagnosis not present

## 2022-09-07 DIAGNOSIS — S72091A Other fracture of head and neck of right femur, initial encounter for closed fracture: Secondary | ICD-10-CM | POA: Diagnosis not present

## 2022-09-07 DIAGNOSIS — I471 Supraventricular tachycardia, unspecified: Secondary | ICD-10-CM

## 2022-09-07 DIAGNOSIS — S7291XA Unspecified fracture of right femur, initial encounter for closed fracture: Secondary | ICD-10-CM | POA: Diagnosis not present

## 2022-09-07 DIAGNOSIS — S72011A Unspecified intracapsular fracture of right femur, initial encounter for closed fracture: Secondary | ICD-10-CM | POA: Diagnosis not present

## 2022-09-07 DIAGNOSIS — F0390 Unspecified dementia without behavioral disturbance: Secondary | ICD-10-CM

## 2022-09-07 DIAGNOSIS — Z9049 Acquired absence of other specified parts of digestive tract: Secondary | ICD-10-CM | POA: Diagnosis not present

## 2022-09-07 DIAGNOSIS — I4719 Other supraventricular tachycardia: Secondary | ICD-10-CM | POA: Diagnosis not present

## 2022-09-07 DIAGNOSIS — S72009A Fracture of unspecified part of neck of unspecified femur, initial encounter for closed fracture: Secondary | ICD-10-CM | POA: Insufficient documentation

## 2022-09-07 DIAGNOSIS — S72001A Fracture of unspecified part of neck of right femur, initial encounter for closed fracture: Secondary | ICD-10-CM

## 2022-09-07 DIAGNOSIS — M810 Age-related osteoporosis without current pathological fracture: Secondary | ICD-10-CM | POA: Diagnosis present

## 2022-09-07 DIAGNOSIS — F0280 Dementia in other diseases classified elsewhere without behavioral disturbance: Secondary | ICD-10-CM | POA: Diagnosis not present

## 2022-09-07 DIAGNOSIS — Z86010 Personal history of colonic polyps: Secondary | ICD-10-CM | POA: Diagnosis not present

## 2022-09-07 DIAGNOSIS — Y92009 Unspecified place in unspecified non-institutional (private) residence as the place of occurrence of the external cause: Secondary | ICD-10-CM | POA: Diagnosis not present

## 2022-09-07 DIAGNOSIS — W010XXA Fall on same level from slipping, tripping and stumbling without subsequent striking against object, initial encounter: Secondary | ICD-10-CM | POA: Diagnosis present

## 2022-09-07 DIAGNOSIS — Z681 Body mass index (BMI) 19 or less, adult: Secondary | ICD-10-CM | POA: Diagnosis not present

## 2022-09-07 DIAGNOSIS — M1990 Unspecified osteoarthritis, unspecified site: Secondary | ICD-10-CM | POA: Diagnosis present

## 2022-09-07 DIAGNOSIS — K2100 Gastro-esophageal reflux disease with esophagitis, without bleeding: Secondary | ICD-10-CM | POA: Diagnosis not present

## 2022-09-07 DIAGNOSIS — F02818 Dementia in other diseases classified elsewhere, unspecified severity, with other behavioral disturbance: Secondary | ICD-10-CM | POA: Diagnosis not present

## 2022-09-07 DIAGNOSIS — D72829 Elevated white blood cell count, unspecified: Secondary | ICD-10-CM | POA: Diagnosis present

## 2022-09-07 DIAGNOSIS — Z8249 Family history of ischemic heart disease and other diseases of the circulatory system: Secondary | ICD-10-CM | POA: Diagnosis not present

## 2022-09-07 DIAGNOSIS — Z85828 Personal history of other malignant neoplasm of skin: Secondary | ICD-10-CM | POA: Diagnosis not present

## 2022-09-07 DIAGNOSIS — D696 Thrombocytopenia, unspecified: Secondary | ICD-10-CM | POA: Diagnosis not present

## 2022-09-07 DIAGNOSIS — N401 Enlarged prostate with lower urinary tract symptoms: Secondary | ICD-10-CM | POA: Diagnosis not present

## 2022-09-07 DIAGNOSIS — Z8616 Personal history of COVID-19: Secondary | ICD-10-CM | POA: Diagnosis not present

## 2022-09-07 DIAGNOSIS — Z825 Family history of asthma and other chronic lower respiratory diseases: Secondary | ICD-10-CM | POA: Diagnosis not present

## 2022-09-07 DIAGNOSIS — N400 Enlarged prostate without lower urinary tract symptoms: Secondary | ICD-10-CM

## 2022-09-07 DIAGNOSIS — Z87442 Personal history of urinary calculi: Secondary | ICD-10-CM | POA: Diagnosis not present

## 2022-09-07 DIAGNOSIS — Z823 Family history of stroke: Secondary | ICD-10-CM | POA: Diagnosis not present

## 2022-09-07 DIAGNOSIS — Z882 Allergy status to sulfonamides status: Secondary | ICD-10-CM | POA: Diagnosis not present

## 2022-09-07 DIAGNOSIS — Z881 Allergy status to other antibiotic agents status: Secondary | ICD-10-CM | POA: Diagnosis not present

## 2022-09-07 HISTORY — PX: TOTAL HIP ARTHROPLASTY: SHX124

## 2022-09-07 LAB — CBC WITH DIFFERENTIAL/PLATELET
Abs Immature Granulocytes: 0.05 10*3/uL (ref 0.00–0.07)
Basophils Absolute: 0 10*3/uL (ref 0.0–0.1)
Basophils Relative: 0 %
Eosinophils Absolute: 0.1 10*3/uL (ref 0.0–0.5)
Eosinophils Relative: 1 %
HCT: 41.6 % (ref 39.0–52.0)
Hemoglobin: 14 g/dL (ref 13.0–17.0)
Immature Granulocytes: 0 %
Lymphocytes Relative: 11 %
Lymphs Abs: 1.4 10*3/uL (ref 0.7–4.0)
MCH: 31.7 pg (ref 26.0–34.0)
MCHC: 33.7 g/dL (ref 30.0–36.0)
MCV: 94.1 fL (ref 80.0–100.0)
Monocytes Absolute: 0.7 10*3/uL (ref 0.1–1.0)
Monocytes Relative: 6 %
Neutro Abs: 10.4 10*3/uL — ABNORMAL HIGH (ref 1.7–7.7)
Neutrophils Relative %: 82 %
Platelets: 124 10*3/uL — ABNORMAL LOW (ref 150–400)
RBC: 4.42 MIL/uL (ref 4.22–5.81)
RDW: 14.6 % (ref 11.5–15.5)
WBC: 12.6 10*3/uL — ABNORMAL HIGH (ref 4.0–10.5)
nRBC: 0 % (ref 0.0–0.2)

## 2022-09-07 LAB — URINALYSIS, W/ REFLEX TO CULTURE (INFECTION SUSPECTED)
Bilirubin Urine: NEGATIVE
Glucose, UA: NEGATIVE mg/dL
Hgb urine dipstick: NEGATIVE
Ketones, ur: 20 mg/dL — AB
Leukocytes,Ua: NEGATIVE
Nitrite: NEGATIVE
Protein, ur: NEGATIVE mg/dL
Specific Gravity, Urine: 1.021 (ref 1.005–1.030)
pH: 5 (ref 5.0–8.0)

## 2022-09-07 LAB — COMPREHENSIVE METABOLIC PANEL
ALT: 23 U/L (ref 0–44)
AST: 27 U/L (ref 15–41)
Albumin: 4 g/dL (ref 3.5–5.0)
Alkaline Phosphatase: 93 U/L (ref 38–126)
Anion gap: 7 (ref 5–15)
BUN: 25 mg/dL — ABNORMAL HIGH (ref 8–23)
CO2: 26 mmol/L (ref 22–32)
Calcium: 8.9 mg/dL (ref 8.9–10.3)
Chloride: 103 mmol/L (ref 98–111)
Creatinine, Ser: 0.81 mg/dL (ref 0.61–1.24)
GFR, Estimated: >60 mL/min (ref >60)
Glucose, Bld: 116 mg/dL — ABNORMAL HIGH (ref 70–99)
Potassium: 4 mmol/L (ref 3.5–5.1)
Sodium: 136 mmol/L (ref 135–145)
Total Bilirubin: 0.9 mg/dL (ref 0.3–1.2)
Total Protein: 6.7 g/dL (ref 6.5–8.1)

## 2022-09-07 LAB — VITAMIN D 25 HYDROXY (VIT D DEFICIENCY, FRACTURES): Vit D, 25-Hydroxy: 42.61 ng/mL (ref 30–100)

## 2022-09-07 LAB — SURGICAL PCR SCREEN
MRSA, PCR: NEGATIVE
Staphylococcus aureus: NEGATIVE

## 2022-09-07 SURGERY — ARTHROPLASTY, HIP, TOTAL, ANTERIOR APPROACH
Anesthesia: Spinal | Site: Hip | Laterality: Right

## 2022-09-07 MED ORDER — HYDROCODONE-ACETAMINOPHEN 7.5-325 MG PO TABS
1.0000 | ORAL_TABLET | ORAL | Status: DC | PRN
  Administered 2022-09-10 (×2): 1 via ORAL
  Filled 2022-09-07: qty 1
  Filled 2022-09-07: qty 2
  Filled 2022-09-07: qty 1

## 2022-09-07 MED ORDER — FENTANYL CITRATE (PF) 100 MCG/2ML IJ SOLN
INTRAMUSCULAR | Status: AC
  Filled 2022-09-07: qty 2

## 2022-09-07 MED ORDER — FENTANYL CITRATE PF 50 MCG/ML IJ SOSY
50.0000 ug | PREFILLED_SYRINGE | Freq: Once | INTRAMUSCULAR | Status: AC
  Administered 2022-09-07: 50 ug via INTRAVENOUS
  Filled 2022-09-07: qty 1

## 2022-09-07 MED ORDER — LIDOCAINE HCL (PF) 2 % IJ SOLN
INTRAMUSCULAR | Status: AC
  Filled 2022-09-07: qty 5

## 2022-09-07 MED ORDER — ACETAMINOPHEN 10 MG/ML IV SOLN
INTRAVENOUS | Status: DC | PRN
  Administered 2022-09-07: 1000 mg via INTRAVENOUS

## 2022-09-07 MED ORDER — CEFAZOLIN SODIUM-DEXTROSE 1-4 GM/50ML-% IV SOLN
1.0000 g | Freq: Four times a day (QID) | INTRAVENOUS | Status: AC
  Administered 2022-09-07 (×2): 1 g via INTRAVENOUS
  Filled 2022-09-07 (×3): qty 50

## 2022-09-07 MED ORDER — PHENOL 1.4 % MT LIQD: 1.0000 | OROMUCOSAL | Status: DC | PRN

## 2022-09-07 MED ORDER — TRANEXAMIC ACID-NACL 1000-0.7 MG/100ML-% IV SOLN
1000.0000 mg | INTRAVENOUS | Status: AC
  Administered 2022-09-07: 1000 mg via INTRAVENOUS

## 2022-09-07 MED ORDER — TRANEXAMIC ACID-NACL 1000-0.7 MG/100ML-% IV SOLN
INTRAVENOUS | Status: AC
  Filled 2022-09-07: qty 100

## 2022-09-07 MED ORDER — PROPOFOL 10 MG/ML IV BOLUS
INTRAVENOUS | Status: DC | PRN
  Administered 2022-09-07 (×3): 20 mg via INTRAVENOUS

## 2022-09-07 MED ORDER — PROPOFOL 500 MG/50ML IV EMUL
INTRAVENOUS | Status: DC | PRN
  Administered 2022-09-07: 35 ug/kg/min via INTRAVENOUS

## 2022-09-07 MED ORDER — ONDANSETRON HCL 4 MG/2ML IJ SOLN: 4.0000 mg | Freq: Four times a day (QID) | INTRAMUSCULAR | Status: DC | PRN

## 2022-09-07 MED ORDER — PHENYLEPHRINE HCL-NACL 20-0.9 MG/250ML-% IV SOLN
INTRAVENOUS | Status: DC | PRN
  Administered 2022-09-07: 50 ug/min via INTRAVENOUS

## 2022-09-07 MED ORDER — MORPHINE SULFATE (PF) 2 MG/ML IV SOLN
0.5000 mg | INTRAVENOUS | Status: DC | PRN
  Administered 2022-09-07 (×2): 1 mg via INTRAVENOUS
  Filled 2022-09-07 (×2): qty 1

## 2022-09-07 MED ORDER — POLYETHYLENE GLYCOL 3350 17 G PO PACK
17.0000 g | PACK | Freq: Every day | ORAL | Status: DC | PRN
  Administered 2022-09-09 – 2022-09-10 (×2): 17 g via ORAL
  Filled 2022-09-07: qty 1

## 2022-09-07 MED ORDER — MUPIROCIN 2 % EX OINT: 1.0000 | TOPICAL_OINTMENT | Freq: Two times a day (BID) | CUTANEOUS | Status: DC

## 2022-09-07 MED ORDER — METOCLOPRAMIDE HCL 5 MG PO TABS: 5.0000 mg | ORAL_TABLET | Freq: Three times a day (TID) | ORAL | Status: DC | PRN

## 2022-09-07 MED ORDER — HYDROCODONE-ACETAMINOPHEN 5-325 MG PO TABS
1.0000 | ORAL_TABLET | Freq: Four times a day (QID) | ORAL | Status: DC | PRN
  Administered 2022-09-07: 1 via ORAL
  Administered 2022-09-07: 2 via ORAL
  Filled 2022-09-07 (×2): qty 2
  Filled 2022-09-07: qty 1
  Filled 2022-09-07: qty 2

## 2022-09-07 MED ORDER — PROPOFOL 10 MG/ML IV BOLUS
INTRAVENOUS | Status: AC
  Filled 2022-09-07: qty 20

## 2022-09-07 MED ORDER — ACETAMINOPHEN 325 MG PO TABS
325.0000 mg | ORAL_TABLET | Freq: Four times a day (QID) | ORAL | Status: DC | PRN
  Administered 2022-09-09: 650 mg via ORAL
  Filled 2022-09-07 (×2): qty 2

## 2022-09-07 MED ORDER — LACTATED RINGERS IV SOLN: INTRAVENOUS | Status: DC | PRN

## 2022-09-07 MED ORDER — DEXAMETHASONE SODIUM PHOSPHATE 10 MG/ML IJ SOLN
INTRAMUSCULAR | Status: AC
  Filled 2022-09-07: qty 1

## 2022-09-07 MED ORDER — 0.9 % SODIUM CHLORIDE (POUR BTL) OPTIME
TOPICAL | Status: DC | PRN
  Administered 2022-09-07: 1000 mL

## 2022-09-07 MED ORDER — ONDANSETRON HCL 4 MG/2ML IJ SOLN: 4.0000 mg | Freq: Once | INTRAMUSCULAR | Status: DC | PRN

## 2022-09-07 MED ORDER — ASPIRIN 81 MG PO CHEW
81.0000 mg | CHEWABLE_TABLET | Freq: Two times a day (BID) | ORAL | Status: DC
  Administered 2022-09-07 – 2022-09-10 (×6): 81 mg via ORAL
  Filled 2022-09-07 (×6): qty 1

## 2022-09-07 MED ORDER — MENTHOL 3 MG MT LOZG: 1.0000 | LOZENGE | OROMUCOSAL | Status: DC | PRN

## 2022-09-07 MED ORDER — CEFAZOLIN SODIUM-DEXTROSE 2-4 GM/100ML-% IV SOLN
INTRAVENOUS | Status: AC
  Filled 2022-09-07: qty 100

## 2022-09-07 MED ORDER — SENNOSIDES-DOCUSATE SODIUM 8.6-50 MG PO TABS
1.0000 | ORAL_TABLET | Freq: Every day | ORAL | Status: DC
  Administered 2022-09-07: 1 via ORAL
  Filled 2022-09-07 (×4): qty 1

## 2022-09-07 MED ORDER — HYDROCODONE-ACETAMINOPHEN 5-325 MG PO TABS
1.0000 | ORAL_TABLET | ORAL | Status: DC | PRN
  Administered 2022-09-07 – 2022-09-08 (×2): 2 via ORAL
  Administered 2022-09-08 (×2): 1 via ORAL
  Administered 2022-09-09: 2 via ORAL
  Administered 2022-09-09: 1 via ORAL
  Filled 2022-09-07 (×2): qty 1
  Filled 2022-09-07 (×2): qty 2

## 2022-09-07 MED ORDER — DEXAMETHASONE SODIUM PHOSPHATE 10 MG/ML IJ SOLN
INTRAMUSCULAR | Status: DC | PRN
  Administered 2022-09-07: 5 mg via INTRAVENOUS

## 2022-09-07 MED ORDER — ONDANSETRON HCL 4 MG PO TABS: 4.0000 mg | ORAL_TABLET | Freq: Four times a day (QID) | ORAL | Status: DC | PRN

## 2022-09-07 MED ORDER — SODIUM CHLORIDE 0.9 % IV SOLN: INTRAVENOUS | Status: DC

## 2022-09-07 MED ORDER — MORPHINE SULFATE (PF) 2 MG/ML IV SOLN
0.5000 mg | INTRAVENOUS | Status: DC | PRN
  Administered 2022-09-07: 0.5 mg via INTRAVENOUS
  Filled 2022-09-07: qty 1

## 2022-09-07 MED ORDER — AMISULPRIDE (ANTIEMETIC) 5 MG/2ML IV SOLN: 10.0000 mg | Freq: Once | INTRAVENOUS | Status: DC | PRN

## 2022-09-07 MED ORDER — CEFAZOLIN SODIUM-DEXTROSE 2-4 GM/100ML-% IV SOLN
2.0000 g | Freq: Once | INTRAVENOUS | Status: AC
  Administered 2022-09-07: 2 g via INTRAVENOUS

## 2022-09-07 MED ORDER — FENTANYL CITRATE (PF) 100 MCG/2ML IJ SOLN
INTRAMUSCULAR | Status: DC | PRN
  Administered 2022-09-07: 50 ug via INTRAVENOUS
  Administered 2022-09-07 (×2): 25 ug via INTRAVENOUS

## 2022-09-07 MED ORDER — FENTANYL CITRATE PF 50 MCG/ML IJ SOSY: 25.0000 ug | PREFILLED_SYRINGE | INTRAMUSCULAR | Status: DC | PRN

## 2022-09-07 MED ORDER — ACETAMINOPHEN 10 MG/ML IV SOLN: 1000.0000 mg | Freq: Once | INTRAVENOUS | Status: DC | PRN

## 2022-09-07 MED ORDER — METOCLOPRAMIDE HCL 5 MG/ML IJ SOLN: 5.0000 mg | Freq: Three times a day (TID) | INTRAMUSCULAR | Status: DC | PRN

## 2022-09-07 MED ORDER — FOOD THICKENER (SIMPLYTHICK)
4.0000 | ORAL | Status: DC | PRN
  Filled 2022-09-07 (×3): qty 4

## 2022-09-07 MED ORDER — DOCUSATE SODIUM 100 MG PO CAPS
100.0000 mg | ORAL_CAPSULE | Freq: Two times a day (BID) | ORAL | Status: DC
  Administered 2022-09-07 – 2022-09-10 (×6): 100 mg via ORAL
  Filled 2022-09-07 (×7): qty 1

## 2022-09-07 MED ORDER — ACETAMINOPHEN 10 MG/ML IV SOLN
INTRAVENOUS | Status: AC
  Filled 2022-09-07: qty 100

## 2022-09-07 MED ORDER — STERILE WATER FOR IRRIGATION IR SOLN
Status: DC | PRN
  Administered 2022-09-07: 2000 mL

## 2022-09-07 MED ORDER — BUPIVACAINE IN DEXTROSE 0.75-8.25 % IT SOLN
INTRATHECAL | Status: DC | PRN
  Administered 2022-09-07: 1.8 mL via INTRATHECAL

## 2022-09-07 MED ORDER — SODIUM CHLORIDE 0.9 % IR SOLN
Status: DC | PRN
  Administered 2022-09-07: 3000 mL

## 2022-09-07 MED ORDER — ONDANSETRON HCL 4 MG/2ML IJ SOLN
INTRAMUSCULAR | Status: DC | PRN
  Administered 2022-09-07: 4 mg via INTRAVENOUS

## 2022-09-07 SURGICAL SUPPLY — 44 items
ARTICULEZE HEAD (Hips) ×1 IMPLANT
BAG COUNTER SPONGE SURGICOUNT (BAG) ×1 IMPLANT
BAG SPEC THK2 15X12 ZIP CLS (MISCELLANEOUS)
BAG SPNG CNTER NS LX DISP (BAG) ×1
BAG ZIPLOCK 12X15 (MISCELLANEOUS) IMPLANT
BLADE SAW SGTL 18X1.27X75 (BLADE) ×1 IMPLANT
COVER PERINEAL POST (MISCELLANEOUS) ×1 IMPLANT
COVER SURGICAL LIGHT HANDLE (MISCELLANEOUS) ×1 IMPLANT
DRAPE FOOT SWITCH (DRAPES) ×1 IMPLANT
DRAPE STERI IOBAN 125X83 (DRAPES) ×1 IMPLANT
DRAPE U-SHAPE 47X51 STRL (DRAPES) ×2 IMPLANT
DRESSING AQUACEL AG SP 3.5X10 (GAUZE/BANDAGES/DRESSINGS) IMPLANT
DRSG AQUACEL AG ADV 3.5X10 (GAUZE/BANDAGES/DRESSINGS) ×1 IMPLANT
DRSG AQUACEL AG SP 3.5X10 (GAUZE/BANDAGES/DRESSINGS) ×1
DURAPREP 26ML APPLICATOR (WOUND CARE) ×1 IMPLANT
ELECT REM PT RETURN 15FT ADLT (MISCELLANEOUS) ×1 IMPLANT
GAUZE XEROFORM 1X8 LF (GAUZE/BANDAGES/DRESSINGS) IMPLANT
GLOVE BIO SURGEON STRL SZ 6.5 (GLOVE) IMPLANT
GLOVE BIO SURGEON STRL SZ7.5 (GLOVE) ×1 IMPLANT
GLOVE BIOGEL PI IND STRL 6.5 (GLOVE) IMPLANT
GLOVE BIOGEL PI IND STRL 7.5 (GLOVE) IMPLANT
GLOVE BIOGEL PI IND STRL 8 (GLOVE) IMPLANT
GOWN STRL REUS W/ TWL XL LVL3 (GOWN DISPOSABLE) ×2 IMPLANT
GOWN STRL REUS W/TWL LRG LVL3 (GOWN DISPOSABLE) IMPLANT
GOWN STRL REUS W/TWL XL LVL3 (GOWN DISPOSABLE) ×3
HANDPIECE INTERPULSE COAX TIP (DISPOSABLE) ×1
HEAD ARTICULEZE (Hips) IMPLANT
HOLDER FOLEY CATH W/STRAP (MISCELLANEOUS) ×1 IMPLANT
KIT TURNOVER KIT A (KITS) IMPLANT
LINER NEUTRAL 52X36MM PLUS 4 (Liner) IMPLANT
PACK ANTERIOR HIP CUSTOM (KITS) ×1 IMPLANT
PIN SECTOR W/GRIP ACE CUP 52MM (Hips) IMPLANT
SET HNDPC FAN SPRY TIP SCT (DISPOSABLE) ×1 IMPLANT
STAPLER VISISTAT 35W (STAPLE) IMPLANT
STEM FEMORAL SZ 6MM STD ACTIS (Stem) IMPLANT
SUT ETHIBOND NAB CT1 #1 30IN (SUTURE) ×1 IMPLANT
SUT ETHILON 2 0 PS N (SUTURE) IMPLANT
SUT MNCRL AB 4-0 PS2 18 (SUTURE) IMPLANT
SUT VIC AB 0 CT1 36 (SUTURE) ×1 IMPLANT
SUT VIC AB 1 CT1 36 (SUTURE) ×1 IMPLANT
SUT VIC AB 2-0 CT1 27 (SUTURE) ×2
SUT VIC AB 2-0 CT1 TAPERPNT 27 (SUTURE) ×2 IMPLANT
TRAY FOLEY MTR SLVR 16FR STAT (SET/KITS/TRAYS/PACK) IMPLANT
YANKAUER SUCT BULB TIP NO VENT (SUCTIONS) ×1 IMPLANT

## 2022-09-07 NOTE — Anesthesia Procedure Notes (Signed)
Spinal  Patient location during procedure: OR Start time: 09/07/2022 11:00 AM End time: 09/07/2022 11:05 AM Reason for block: surgical anesthesia Staffing Performed: anesthesiologist  Anesthesiologist: Leonides Grills, MD Performed by: Leonides Grills, MD Authorized by: Leonides Grills, MD   Preanesthetic Checklist Completed: patient identified, IV checked, risks and benefits discussed, surgical consent, monitors and equipment checked, pre-op evaluation and timeout performed Spinal Block Patient position: right lateral decubitus Prep: DuraPrep Patient monitoring: cardiac monitor, continuous pulse ox and blood pressure Approach: midline Location: L4-5 Injection technique: single-shot Needle Needle type: Pencan  Needle gauge: 24 G Needle length: 9 cm Assessment Sensory level: T10 Events: CSF return Additional Notes Functioning IV was confirmed and monitors were applied. Sterile prep and drape, including hand hygiene and sterile gloves were used. The patient was positioned and the spine was prepped. The skin was anesthetized with lidocaine.  Free flow of clear CSF was obtained prior to injecting local anesthetic into the CSF.  The spinal needle aspirated freely following injection.  The needle was carefully withdrawn.  The patient tolerated the procedure well.

## 2022-09-07 NOTE — Transfer of Care (Signed)
Immediate Anesthesia Transfer of Care Note  Patient: Lee Nelson  Procedure(s) Performed: TOTAL HIP ARTHROPLASTY ANTERIOR APPROACH (Right: Hip)  Patient Location: PACU  Anesthesia Type:Spinal  Level of Consciousness: awake, alert , and oriented  Airway & Oxygen Therapy: Patient Spontanous Breathing and Patient connected to face mask oxygen  Post-op Assessment: Report given to RN and Post -op Vital signs reviewed and stable  Post vital signs: Reviewed and stable  Last Vitals:  Vitals Value Taken Time  BP 112/53 09/07/22 1226  Temp    Pulse 71 09/07/22 1228  Resp 10 09/07/22 1228  SpO2 100 % 09/07/22 1228  Vitals shown include unvalidated device data.  Last Pain:  Vitals:   09/07/22 0828  TempSrc:   PainSc: 4          Complications: No notable events documented.

## 2022-09-07 NOTE — Anesthesia Preprocedure Evaluation (Addendum)
Anesthesia Evaluation  Patient identified by MRN, date of birth, ID band Patient awake    Reviewed: Allergy & Precautions, NPO status , Patient's Chart, lab work & pertinent test results  Airway Mallampati: III  TM Distance: >3 FB Neck ROM: Full    Dental no notable dental hx.    Pulmonary neg pulmonary ROS   Pulmonary exam normal        Cardiovascular negative cardio ROS Normal cardiovascular exam     Neuro/Psych  Headaches PSYCHIATRIC DISORDERS Anxiety    Dementia    GI/Hepatic negative GI ROS, Neg liver ROS,,,  Endo/Other  negative endocrine ROS    Renal/GU negative Renal ROS     Musculoskeletal  (+) Arthritis ,    Abdominal   Peds  Hematology negative hematology ROS (+)   Anesthesia Other Findings RIGHT HIP FRACTURE  Reproductive/Obstetrics                             Anesthesia Physical Anesthesia Plan  ASA: 2  Anesthesia Plan: Spinal   Post-op Pain Management:    Induction: Intravenous  PONV Risk Score and Plan: 1 and Ondansetron, Dexamethasone, Propofol infusion and Treatment may vary due to age or medical condition  Airway Management Planned: Simple Face Mask  Additional Equipment:   Intra-op Plan:   Post-operative Plan:   Informed Consent: I have reviewed the patients History and Physical, chart, labs and discussed the procedure including the risks, benefits and alternatives for the proposed anesthesia with the patient or authorized representative who has indicated his/her understanding and acceptance.     Dental advisory given  Plan Discussed with: CRNA  Anesthesia Plan Comments:        Anesthesia Quick Evaluation

## 2022-09-07 NOTE — Op Note (Signed)
Operative Note  Date of operation: 09/07/2022 Preoperative diagnosis: Right subcapital femoral neck fracture Postoperative diagnosis: Same  Procedure: Right direct anterior total hip arthroplasty  Implants: Implant Name Type Inv. Item Serial No. Manufacturer Lot No. LRB No. Used Action  PIN SECTOR W/GRIP ACE CUP - [id number] Hips PIN SECTOR W/GRIP ACE CUP  [employer] [id number] Right 1 Implanted  LINER NEUTRAL 52X36MM PLUS 4 - [id number] Liner LINER NEUTRAL [id number] PLUS 4  DEPUY ORTH[employer] [id number] Right 1 Implanted  STEM FEMORAL SZ STD ACTIS - [id number] Stem STEM FEMORAL SZ STD ACTIS  DEPUY ORTH[employer] [id number] Right 1 Implanted  ARTICULEZE HEAD - [id number] Hips ARTICULEZE HEAD  DEPUY ORTH[employer] [id number] Right 1 Implanted   Surgeon: Vanita Panda. Magnus Ivan, MD  Anesthesia: Spinal EBL: 200 to 250 cc Antibiotics: 2 g IV Ancef Complications: None  Indications: The patient is an 82 year old gentleman who unfortunately sustained a mechanical fall accidentally yesterday landing on his right hip.  He was at home.  He had the inability to ambulate and severe hip pain following that fall.  He was seen at the [facility] emergency room and found to have an impacted subcapital right femoral neck fracture.  He was graciously admitted to the hospitalist service and cleared for surgery for today.  We had a long and thorough discussion about treatment options.  I feel that with the displaced nature of this fracture and impaction combined with his high level of function and mobility that a total hip arthroplasty is warranted.  I did describe the risk of acute blood loss anemia, nerve vessel injury, fracture, infection, DVT, dislocation, implant failure, leg length differences and wound healing issues.  The goals are obviously decreased pain, improve mobility and improve quality of life.  Procedure description: After informed consent was obtained and the appropriate right hip was  marked, the patient was brought to the operating room and turned on the side where spinal anesthesia was obtained.  He was then laid in supine position and a Foley catheter was placed.  Traction boots were placed on both his feet and he was placed supine on the Hana fracture table with a perineal post in place in both legs and inline skeletal traction devices but no traction applied.  His right operative hip and pelvis were assessed radiographically.  The right hip was then prepped and draped with DuraPrep and sterile drapes.  A timeout was called and he was identified as the correct patient the correct right hip.  An incision was then made just inferior and posterior to the ASIS and carried slightly obliquely down the leg.  Dissection was carried down to the tensor fascia lata muscle and tensor fascia was then divided longitudinally to proceed with a direct interposed the hip.  Circumflex vessels were identified and cauterized.  The hip capsule identified and opened up in L-type format finding a hemarthrosis consistent with a femoral neck fracture and get seen impacted femoral neck fracture that was definitely subcapital.  A femoral neck cut was then made with an oscillating saw distal to the fracture proximal to the lesser trochanter and then the femoral head was removed its entirety.  A bent Hohmann was placed over the medial acetabular rim and remnants of the acetabular labrum and other debris removed.  Reaming was then initiated from a size 43 reamer and stepwise increments going up to a size 51 reamer with all reamers placed under direct visualization and the last reamer placed under direct fluoroscopy in order  to obtain the depth of reaming, the inclination and the anteversion.  The real DePuy sector GRIPTION acetabular component size 52 was then placed without difficulty followed by 36+4 polythene liner based on medialization.  Attention was then turned to the femur.  With the right leg externally rotated to  120 degrees, extended and adducted a Mueller retractor was placed medially and a Hohmann retractor was placed behind the greater trochanter.  A box cutting osteotome was used in her femoral canal and the lateral joint capsule was released.  Broaching was then initiated using the Actis broaching system from a size 0 going to a size 6.  It seemed like it was going to be a little high in terms of the problems of the stem so we trialed a standard offset femoral neck and a 36-2 trial head ball and then reduce this in the acetabulum.  Based off of this we know we needed more leg length.  We dislocated the hip and remove the trial components.  We placed the real Actis femoral component with standard offset size 6 and went with a 36+5 metal hip ball given we felt it needed the stability of the length and offset.  We reduced the hip and we are pleased with mobility, range of motion, offset and leg length assessing this again radiographically and mechanically.  The soft tissue was then irrigated with normal saline solution.  The joint capsule was closed with interrupted #1 Ethibond suture followed by #1 Vicryl to close the tensor fascia.  0 Vicryl was used to close the deep tissue and 2-0 Vicryl was used to close the subcutaneous tissue.  The skin was closed with staples.  Aquacel dressing was applied.  The patient was taken off the Hana table and taken to the recovery room in stable condition.

## 2022-09-07 NOTE — Progress Notes (Addendum)
[facility]                                                                              Lee Nelson, is a 82 y.o. male, DOB - 24-Jul-1940, NWG:[id number] Admit date - 09/06/2022    Outpatient Primary MD for the patient is Avva, Ravisankar, MD  LOS - 0  days  Chief Complaint  Patient presents with   Fall   Weakness   Hip Pain    Pt reports severe pain in right hip s/p fall approx. 2100 tonight while ambulating with a cane. 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  Patient reported that he was turning from a dresser at home when he lost his balance and fell forward landing directly on his right hip.  No loss of consciousness or head injury.  Has pain in his right hip however now severe to the right hip and pelvis. Right hip with pelvis x-ray showed acute fracture of the proximal right femur.  Patient was admitted, orthopedics was consulted.  Assessment & Plan    Principal Problem: Acute right subcapital closed fracture of right femur (HCC) -Presented with mechanical fall, acute fracture of the proximal right femur -N.p.o., orthopedics consulted, plan for or today -Pain control and DVT prophylaxis per orthopedics -Patient lives at home, have caregivers at home, PT OT after surgery -Vitamin D level 42.6  Active Problems:   G E R D - not on any PPIs  History of PSVT (paroxysmal supraventricular tachycardia) -Currently stable, NSR    Dementia (HCC), has history of NPH -Fairly alert and oriented, appears close to his baseline    BPH (benign prostatic hyperplasia) -Not on any medications, UA negative for UTI  Mild  leukocytosis -No fevers, possibly reactive -UA negative for UTI, CXR with no active disease  Chronic thrombocytopenia -Currently count stable, follow closely  Chronic constipation -Will place on bowel regimen  Underweight Estimated body mass index is 16.45 kg/m as calculated from the following:   Height as of this encounter: 5\' 10"  (1.778 m).   Weight as of this encounter: 52 kg.  Code Status: Full code DVT Prophylaxis:  SCDs Start: 09/07/22 0224   Level of Care: Level of care: Med-Surg Family Communication: Updated patient Disposition Plan:      Remains inpatient appropriate: Pending PT evaluation after surgery.  Patient lives at home with his wife who has functional quadriplegia, has scheduled caregivers at home.    Procedures:    Consultants:   Orthopedics  Antimicrobials:   Anti-infectives (From admission, onward)    Start     Dose/Rate Route Frequency Ordered Stop   09/07/22 1045  ceFAZolin (ANCEF) IVPB 2g/100 mL premix        2 g 200 mL/hr over 30 Minutes Intravenous  Once 09/07/22 1032  09/07/22 1032  ceFAZolin (ANCEF) 2-4 GM/100ML-% IVPB       Note to Pharmacy: Mortimer Fries A: cabinet override      09/07/22 1032 09/07/22 2244          Medications  [MAR Hold] senna-docusate  1 tablet Oral QHS      Subjective:   Lee Nelson was seen and examined today AM.  Pain controlled, awaiting surgery today.  No chest pain, shortness of breath, fevers, nausea or vomiting.      Objective:   Vitals:   09/07/22 0300 09/07/22 0309 09/07/22 1017 09/07/22 1018  BP:  (!) 147/61 (!) 156/64   Pulse:  94 84   Resp:  18 16   Temp:  98.8 F (37.1 C)    TempSrc:      SpO2:  96% (!) 88% 98%  Weight: 52 kg     Height: 5\' 10"  (1.778 m)       Intake/Output Summary (Last 24 hours) at 09/07/2022 1044 Last data filed at 09/07/2022 0600 Gross per 24 hour  Intake 90 ml  Output 150 ml  Net -60 ml     Wt Readings from Last 3 Encounters:  09/07/22 52 kg   08/06/22 53.9 kg  05/15/22 51.4 kg     Exam General: Alert and oriented x 3, NAD Cardiovascular: S1 S2 auscultated,  RRR Respiratory: Clear to auscultation bilaterally Gastrointestinal: Soft, nontender, nondistended, + bowel sounds Ext: no pedal edema bilaterally Neuro: no new deficits Psych: has dementia, appears close to his baseline, fairly alert and oriented    Data Reviewed:  I have personally reviewed following labs    CBC Lab Results  Component Value Date   WBC 12.6 (H) 09/07/2022   RBC 4.42 09/07/2022   HGB 14.0 09/07/2022   HCT 41.6 09/07/2022   MCV 94.1 09/07/2022   MCH 31.7 09/07/2022   PLT 124 (L) 09/07/2022   MCHC 33.7 09/07/2022   RDW 14.6 09/07/2022   LYMPHSABS 1.4 09/07/2022   MONOABS 0.7 09/07/2022   EOSABS 0.1 09/07/2022   BASOSABS 0.0 09/07/2022     Last metabolic panel Lab Results  Component Value Date   NA 136 09/07/2022   K 4.0 09/07/2022   CL 103 09/07/2022   CO2 26 09/07/2022   BUN 25 (H) 09/07/2022   CREATININE 0.81 09/07/2022   GLUCOSE 116 (H) 09/07/2022   GFRNONAA >60 09/07/2022   GFRAA >60 01/03/2020   CALCIUM 8.9 09/07/2022   PHOS 2.3 (L) 01/04/2020   PROT 6.7 09/07/2022   ALBUMIN 4.0 09/07/2022   BILITOT 0.9 09/07/2022   ALKPHOS 93 09/07/2022   AST 27 09/07/2022   ALT 23 09/07/2022   ANIONGAP 7 09/07/2022    CBG (last 3)  No results for input(s): "GLUCAP" in the last 72 hours.    Coagulation Profile: No results for input(s): "INR", "PROTIME" in the last 168 hours.   Radiology Studies: I have personally reviewed the imaging studies  DG Hip Unilat W or Wo Pelvis 2-3 Views Right  Result Date: 09/07/2022 CLINICAL DATA:  Status post fall. EXAM: DG HIP (WITH OR WITHOUT PELVIS) 2-3V RIGHT COMPARISON:  Aug 08, 2022 FINDINGS: There is an acute, impacted fracture deformity involving the neck of the proximal right femur. There is no evidence of dislocation. Mild to moderate severity degenerative changes are seen in the form  of joint space narrowing and acetabular sclerosis. IMPRESSION: Acute fracture of the proximal right femur. Electronically Signed   By: Demetrius Revel.D.  On: 09/07/2022 00:45   DG Chest Port 1 View  Result Date: 09/07/2022 CLINICAL DATA:  Status post fall. EXAM: PORTABLE CHEST 1 VIEW COMPARISON:  May 06, 2022 FINDINGS: The heart size and mediastinal contours are within normal limits. The lungs are hyperinflated. Mild to moderate severity biapical scarring, atelectasis and pleural thickening are seen. There is no evidence of acute infiltrate, pleural effusion or pneumothorax. The visualized skeletal structures are unremarkable. IMPRESSION: No active disease. Electronically Signed   By: Aram Candela M.D.   On: 09/07/2022 00:43       Rebecca Motta M.D. [facility] 09/07/2022, 10:44 AM  Available via Epic secure chat 7am-7pm After 7 pm, please refer to night coverage provider listed on amion.

## 2022-09-07 NOTE — H&P (Signed)
History and Physical    Lee Nelson ZOX:[id number] DOB: 1940/08/25 DOA: 09/06/2022  PCP: Chilton Greathouse, MD  Patient coming from: home  I have personally briefly reviewed patient's old medical records in [facility]  Chief Complaint: mechanical fall with   HPI: Lee Nelson is a 82 y.o. male with medical history significant of  dementia, anxiety, arthritis, osteoporosis, BPH with LUTS, nephrolithiasis, history of skin squamous cell carcinoma status post excision, PSVT, GERD, chronic thrombocytopenia,chronic constipation,NPH dx 05/06/2022 while hospitalized for COVID pneumonia with super imposed bacterial pneumonia. Patient presents to ED s/p mechanical fall with right hip/leg pain.  Per patient he was at his chest of draws  getting an item . He states then he turned and his leg got tangled and he fell on his hip. He currently states has increase pain in the right leg with movement. He note no recent illness. He denies chest pain ,n/v/d/, he also denies any head trauma with fall.   ED Course:  ON evaluation patient found to have  Acute fracture of the proximal right femur. Patient case discussed with Dr Rayburn Ma Current plan for OR in am   Vitals:  afeb, bp 139/52, rr 22, hr 80 , sat 93%  Labs: NA 136, k 4, cl 103, bicarb 26, glu 116, cr 0.81  Cxr:NAD Tx robaxin , zofran , fentanyl   Review of Systems: As per HPI otherwise 10 point review of systems negative.   Past Medical History:  Diagnosis Date   Anxiety    Arthritis    Bladder calculus    BPH (benign prostatic hyperplasia)    Chronic constipation    Complication of anesthesia    " I had some coughing afterwards for a couple of days"--  per pt "perfers spinal anesthesia since general anesthesia congnitive issues when older"   Diverticulosis of colon    Dry eye syndrome of both eyes    Environmental allergies    GERD (gastroesophageal reflux disease)    occasional   History of adenomatous polyp of colon     08/ 2004   History of kidney stones    History of squamous cell carcinoma in situ (SCCIS) of skin    s/p  excision 2013 facial areas and 06/ 2016 nose   Migraine    eye migraine occasional   Seasonal and perennial allergic rhinitis    Thrombocytopenia (HCC)    Tingling    hands and feet bilat , intermittantly-- per pt has lumbar bulging disk   Urinary frequency    Vocal fold atrophy    dysphonia-- per pt has to drink large amount of water to take even on pill   Wears glasses     Past Surgical History:  Procedure Laterality Date   APPENDECTOMY  child   CARDIOVASCULAR STRESS TEST  05-17-2015  dr hilty   Low risk nuclear study w/ no ischemia/  normal LV function and wall motion , stress ef 54% (lvef 45-54%)   CHOLECYSTECTOMY N/A 09/29/2014   Procedure: LAPAROSCOPIC CHOLECYSTECTOMY WITH INTRAOPERATIVE CHOLANGIOGRAM;  Surgeon: Ovidio Kin, MD;  Location: [facility];  Service: General;  Laterality: N/A;   COLONOSCOPY  last one 09-06-2010   ESOPHAGOGASTRODUODENOSCOPY N/A 12/09/2013   Procedure: ESOPHAGOGASTRODUODENOSCOPY (EGD);  Surgeon: Willis Modena, MD;  Location: [facility] ENDOSCOPY;  Service: Endoscopy;  Laterality: N/A;   EXTRACORPOREAL SHOCK WAVE LITHOTRIPSY  yrs ago   INGUINAL HERNIA REPAIR Left child   inguinal hernia repair  09/2017   NISSEN FUNDOPLICATION  1980's  open   STONE EXTRACTION WITH BASKET N/A 08/18/2016   Procedure: STONE EXTRACTION WITH BASKET;  Surgeon: Jethro Bolus, MD;  Location: [facility];  Service: Urology;  Laterality: N/A;   THULIUM LASER TURP (TRANSURETHRAL RESECTION OF PROSTATE) N/A 08/18/2016   Procedure: THULIUM LASER BLADDER NECK INCISION AND BLADDER STONE REMOVAL;  Surgeon: Jethro Bolus, MD;  Location: [facility];  Service: Urology;  Laterality: N/A;   TONSILLECTOMY  child   TRANSTHORACIC ECHOCARDIOGRAM  11/18/2010   grade 1 diastolic dysfunction, ef 55-60%/  trivial MR and TR/ mild dilated RA     reports that he  has never smoked. He has never used smokeless tobacco. He reports current alcohol use. He reports that he does not use drugs.  Allergies  Allergen Reactions   Ciprofloxacin     JOINT PAIN   Flagyl [Metronidazole] Other (See Comments)    REACTION: no appetite, diarrhea after meal, decrease in weight   Metoclopramide Hcl Other (See Comments)    REACTION: "involuntary movements"   Other Other (See Comments)    Antibiotics have unknown reaction propophol causes memory problems   Propofol Other (See Comments)    Other Reaction(s): Other (See Comments)  Memory problems   Risperidone Other (See Comments)    "do not want"   Silodosin     ? Possibly allergy, could not breathe well out of nose   Soy Allergy Other (See Comments)    Stomach upset, "gas"  Other Reaction(s): Other (See Comments)   Sulfamethoxazole-Trimethoprim Other (See Comments)    REACTION: "involuntary movements"  tripac antibiotic- heart rythm  Other Reaction(s): Other (See Comments)  Unknown reaction    Family History  Problem Relation Age of Onset   Parkinsonism Brother    COPD Mother    Allergies Mother    Heart failure Mother    COPD Father    Stroke Father    Other Sister    Suicidality Maternal Aunt    Cancer Maternal Grandfather     Prior to Admission medications   Medication Sig Start Date End Date Taking? Authorizing Provider  B Complex Vitamins (VITAMIN B COMPLEX) TABS Take 1 tablet by mouth daily.    [provider]  Cholecalciferol (VITAMIN D-3) 1000 units CAPS Take 2,000 Units by mouth daily.     [provider]  Cranberry 400 MG CAPS Take 400-1,200 mg by mouth every morning.     [provider]  MAGNESIUM CITRATE PO Take 75 mg by mouth daily.    [provider]  melatonin 3 MG TABS tablet Take 1 tablet (3 mg total) by mouth at bedtime. 05/15/22   Rhetta Mura, MD  Menaquinone-7 (VITAMIN K2 PO) Take 120-240 mg by mouth daily. Combo w/ Vit. D     [provider]  NON FORMULARY Take 1 capsule by mouth daily. Acetyl L carnitine    [provider]  OVER THE COUNTER MEDICATION Take 3 capsules by mouth 2 (two) times daily. Lion's mane 0.5 gram/capsule    [provider]  OVER THE COUNTER MEDICATION Take 2 capsules by mouth every morning. Reparagen supplement    [provider]  Phosphatidylserine 100 MG CAPS Take 100 mg by mouth every morning.     [provider]  Probiotic Product (PROBIOTIC DAILY PO) Take 1 capsule by mouth daily.     [provider]  Propylene Glycol (SYSTANE BALANCE) 0.6 % SOLN Apply 1 drop to eye daily as needed (dry eyes).  [provider]  senna-docusate (SENOKOT-S) 8.6-50 MG tablet Take 1 tablet by mouth at bedtime. 05/15/22   Rhetta Mura, MD  sodium chloride (MURO 128) 5 % ophthalmic ointment Place 1 application into both eyes at bedtime.     [provider]  sodium chloride (MURO 128) 5 % ophthalmic solution See admin instructions. 09/01/20   [provider]  UNABLE TO FIND Med Name: RUEAVWU memory    [provider]  vitamin C (ASCORBIC ACID) 250 MG tablet Take 250 mg by mouth daily.    [provider]  zinc sulfate 220 (50 Zn) MG capsule Take 1 capsule (220 mg total) by mouth daily. 05/15/22   Rhetta Mura, MD    Physical Exam: Vitals:   09/06/22 2330 09/06/22 2345 09/07/22 0000 09/07/22 0015  BP: (!) 150/66 (!) 147/63 (!) 145/67 (!) 146/67  Pulse: 88 88 82 81  Resp: 20 18 20 20   Temp:      TempSrc:      SpO2: 98% 94% 92% 99%  Weight:      Height:        Constitutional: NAD, calm, comfortable Vitals:   09/06/22 2330 09/06/22 2345 09/07/22 0000 09/07/22 0015  BP: (!) 150/66 (!) 147/63 (!) 145/67 (!) 146/67  Pulse: 88 88 82 81  Resp: 20 18 20 20   Temp:      TempSrc:      SpO2: 98% 94% 92% 99%  Weight:      Height:       Eyes: PERRL, lids and conjunctivae normal ENMT: Mucous membranes  are moist. Posterior pharynx clear of any exudate or lesions.Normal dentition.  Neck: normal, supple, no masses, no thyromegaly Respiratory: clear to auscultation bilaterally, no wheezing, no crackles. Normal respiratory effort. No accessory muscle use.  Cardiovascular: Regular rate and rhythm, no murmurs / rubs / gallops. No extremity edema. 2+ pedal pulses. No carotid bruits.  Abdomen: no tenderness, no masses palpated. No hepatosplenomegaly. Bowel sounds positive.  Musculoskeletal: no clubbing / cyanosis.pain with minimal rom  ,even with moving toes on right.Skin: no rashes, lesions, ulcers. No induration Neurologic: CN 2-12 grossly intact. Sensation intact,  MAE x 3 , right lower ext unable to be tested Psychiatric: Normal judgment and insight. Alert and oriented to place and self. Normal mood.    Labs on Admission: I have personally reviewed following labs and imaging studies  CBC: Recent Labs  Lab 09/07/22 0001  WBC 12.6*  NEUTROABS 10.4*  HGB 14.0  HCT 41.6  MCV 94.1  PLT 124*   Basic Metabolic Panel: Recent Labs  Lab 09/07/22 0001  NA 136  K 4.0  CL 103  CO2 26  GLUCOSE 116*  BUN 25*  CREATININE 0.81  CALCIUM 8.9   GFR: Estimated Creatinine Clearance: 51.9 mL/min (by C-G formula based on SCr of 0.81 mg/dL). Liver Function Tests: Recent Labs  Lab 09/07/22 0001  AST 27  ALT 23  ALKPHOS 93  BILITOT 0.9  PROT 6.7  ALBUMIN 4.0   No results for input(s): "LIPASE", "AMYLASE" in the last 168 hours. No results for input(s): "AMMONIA" in the last 168 hours. Coagulation Profile: No results for input(s): "INR", "PROTIME" in the last 168 hours. Cardiac Enzymes: No results for input(s): "CKTOTAL", "CKMB", "CKMBINDEX", "TROPONINI" in the last 168 hours. BNP (last 3 results) No results for input(s): "PROBNP" in the last 8760 hours. HbA1C: No results for input(s): "HGBA1C" in the last 72 hours. CBG: No results for input(s): "GLUCAP" in the last 168  hours. Lipid  Profile: No results for input(s): "CHOL", "HDL", "LDLCALC", "TRIG", "CHOLHDL", "LDLDIRECT" in the last 72 hours. Thyroid Function Tests: No results for input(s): "TSH", "T4TOTAL", "FREET4", "T3FREE", "THYROIDAB" in the last 72 hours. Anemia Panel: No results for input(s): "VITAMINB12", "FOLATE", "FERRITIN", "TIBC", "IRON", "RETICCTPCT" in the last 72 hours. Urine analysis:    Component Value Date/Time   COLORURINE YELLOW 09/07/2022 0001   APPEARANCEUR CLEAR 09/07/2022 0001   LABSPEC 1.021 09/07/2022 0001   PHURINE 5.0 09/07/2022 0001   GLUCOSEU NEGATIVE 09/07/2022 0001   HGBUR NEGATIVE 09/07/2022 0001   BILIRUBINUR NEGATIVE 09/07/2022 0001   KETONESUR 20 (A) 09/07/2022 0001   PROTEINUR NEGATIVE 09/07/2022 0001   UROBILINOGEN 0.2 11/20/2014 2159   NITRITE NEGATIVE 09/07/2022 0001   LEUKOCYTESUR NEGATIVE 09/07/2022 0001    Radiological Exams on Admission: DG Hip Unilat W or Wo Pelvis 2-3 Views Right  Result Date: 09/07/2022 CLINICAL DATA:  Status post fall. EXAM: DG HIP (WITH OR WITHOUT PELVIS) 2-3V RIGHT COMPARISON:  Aug 08, 2022 FINDINGS: There is an acute, impacted fracture deformity involving the neck of the proximal right femur. There is no evidence of dislocation. Mild to moderate severity degenerative changes are seen in the form of joint space narrowing and acetabular sclerosis. IMPRESSION: Acute fracture of the proximal right femur. Electronically Signed   By: Aram Candela M.D.   On: 09/07/2022 00:45   DG Chest Port 1 View  Result Date: 09/07/2022 CLINICAL DATA:  Status post fall. EXAM: PORTABLE CHEST 1 VIEW COMPARISON:  May 06, 2022 FINDINGS: The heart size and mediastinal contours are within normal limits. The lungs are hyperinflated. Mild to moderate severity biapical scarring, atelectasis and pleural thickening are seen. There is no evidence of acute infiltrate, pleural effusion or pneumothorax. The visualized skeletal structures are unremarkable. IMPRESSION: No active  disease. Electronically Signed   By: Aram Candela M.D.   On: 09/07/2022 00:43    EKG: Independently reviewed.  Assessment/Plan Right proximal femur fracture -admit to tele  -place on hip fracture protocol  -ortho consult [facility] awaiting further recs -supportive with pain medications  -patient is cleared for surgery and is a low risk for low-intermediate surgery  3.9% 30 day risk of death, MI or cardiac arrest based on Cardiac risk index for pre-operative risk   Leukocytosis  -due to stress response  -cxr NAD - UA rare bacteria, f/u with urine culture   Normal Pressure Hydrocephalus -follows with neurology   Hx of PSVT -monitor on tele  -EKG pending  - snr  Chronic thrombocytopenia  -plt 124    DVT prophylaxis: scd Code Status: Family Communication: caregiver at bedside Disposition Plan:patient  expected to be admitted greater than 2 midnights  Consults called: Dr Rayburn Ma -ortho Admission status: med tele   Lurline Del MD [facility]   If 7PM-7AM, please contact night-coverage [email or web address] Password [email or web address]  09/07/2022, 1:52 AM

## 2022-09-07 NOTE — Consult Note (Signed)
Reason for Consult: Right hip fracture Referring Physician: Lucien Mons EDP Lee [place], MD)  Lee Nelson is an 82 y.o. male.  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  He normally ambulates using a cane but had had a hospitalization which caused significant weakness several months ago and he had been on a walker for a while.  This was an accidental fall when he tripped at his dresser I believe.  He landed hard on his right hip and had the inability to ambulate with significant right hip pain.  He was brought via EMS to the [facility] emergency room and found to have a displaced subcapital right femoral neck fracture.  He was admitted graciously to the medicine service with [facility].  He has been n.p.o.  He is awake and alert and reports only right hip pain.  He follows commands appropriately.  Past Medical History:  Diagnosis Date   Anxiety    Arthritis    Bladder calculus    BPH (benign prostatic hyperplasia)    Chronic constipation    Complication of anesthesia    " I had some coughing afterwards for a couple of days"--  per pt "perfers spinal anesthesia since general anesthesia congnitive issues when older"   Diverticulosis of colon    Dry eye syndrome of both eyes    Environmental allergies    GERD (gastroesophageal reflux disease)    occasional   History of adenomatous polyp of colon    08/ 2004   History of kidney stones    History of squamous cell carcinoma in situ (SCCIS) of skin    s/p  excision 2013 facial areas and 06/ 2016 nose   Migraine    eye migraine occasional   Seasonal and perennial allergic rhinitis    Thrombocytopenia (HCC)    Tingling    hands and feet bilat , intermittantly-- per pt has lumbar bulging disk   Urinary frequency    Vocal fold atrophy    dysphonia-- per pt has to drink large amount of water to take even on pill   Wears glasses     Past Surgical  History:  Procedure Laterality Date   APPENDECTOMY  child   CARDIOVASCULAR STRESS TEST  05-17-2015  dr hilty   Low risk nuclear study w/ no ischemia/  normal LV function and wall motion , stress ef 54% (lvef 45-54%)   CHOLECYSTECTOMY N/A 09/29/2014   Procedure: LAPAROSCOPIC CHOLECYSTECTOMY WITH INTRAOPERATIVE CHOLANGIOGRAM;  Surgeon: Ovidio Kin, MD;  Location: [facility];  Service: General;  Laterality: N/A;   COLONOSCOPY  last one 09-06-2010   ESOPHAGOGASTRODUODENOSCOPY N/A 12/09/2013   Procedure: ESOPHAGOGASTRODUODENOSCOPY (EGD);  Surgeon: Willis Modena, MD;  Location[facility];  Service: Endoscopy;  Laterality: N/A;   EXTRACORPOREAL SHOCK WAVE LITHOTRIPSY  yrs ago   INGUINAL HERNIA REPAIR Left child   inguinal hernia repair  09/2017   NISSEN FUNDOPLICATION  1980's   open   STONE EXTRACTION WITH BASKET N/A 08/18/2016   Procedure: STONE EXTRACTION WITH BASKET;  Surgeon: Jethro Bolus, MD;  Location: [facility];  Service: Urology;  Laterality: N/A;   THULIUM LASER TURP (TRANSURETHRAL RESECTION OF PROSTATE) N/A 08/18/2016   Procedure: THULIUM LASER BLADDER NECK INCISION AND BLADDER STONE REMOVAL;  Surgeon: Jethro Bolus, MD;  Location: [facility];  Service: Urology;  Laterality: N/A;   TONSILLECTOMY  child   TRANSTHORACIC ECHOCARDIOGRAM  11/18/2010  grade 1 diastolic dysfunction, ef 55-60%/  trivial MR and TR/ mild dilated RA    Family History  Problem Relation Age of Onset   Parkinsonism Brother    COPD Mother    Allergies Mother    Heart failure Mother    COPD Father    Stroke Father    Other Sister    Suicidality Maternal Aunt    Cancer Maternal Grandfather     Social History:  reports that he has never smoked. He has never used smokeless tobacco. He reports current alcohol use. He reports that he does not use drugs.  Allergies:  Allergies  Allergen Reactions   Ciprofloxacin     JOINT PAIN   Flagyl [Metronidazole] Other (See  Comments)    REACTION: no appetite, diarrhea after meal, decrease in weight   Metoclopramide Hcl Other (See Comments)    REACTION: "involuntary movements"   Other Other (See Comments)    Antibiotics have unknown reaction propophol causes memory problems   Propofol Other (See Comments)    Other Reaction(s): Other (See Comments)  Memory problems   Risperidone Other (See Comments)    "do not want"   Silodosin     ? Possibly allergy, could not breathe well out of nose   Soy Allergy Other (See Comments)    Stomach upset, "gas"  Other Reaction(s): Other (See Comments)   Sulfamethoxazole-Trimethoprim Other (See Comments)    REACTION: "involuntary movements"  tripac antibiotic- heart rythm  Other Reaction(s): Other (See Comments)  Unknown reaction    Medications: I have reviewed the patient's current medications.  Results for orders placed or performed during the hospital encounter of 09/06/22 (from the past 48 hour(s))  Comprehensive metabolic panel     Status: Abnormal   Collection Time: 09/07/22 12:01 AM  Result Value Ref Range   Sodium 136 135 - 145 mmol/L   Potassium 4.0 3.5 - 5.1 mmol/L   Chloride 103 98 - 111 mmol/L   CO2 26 22 - 32 mmol/L   Glucose, Bld 116 (H) 70 - 99 mg/dL    Comment: Glucose reference range applies only to samples taken after fasting for at least 8 hours.   BUN 25 (H) 8 - 23 mg/dL   Creatinine, Ser 5.36 0.61 - 1.24 mg/dL   Calcium 8.9 8.9 - 64.4 mg/dL   Total Protein 6.7 6.5 - 8.1 g/dL   Albumin 4.0 3.5 - 5.0 g/dL   AST 27 15 - 41 U/L   ALT 23 0 - 44 U/L   Alkaline Phosphatase 93 38 - 126 U/L   Total Bilirubin 0.9 0.3 - 1.2 mg/dL   GFR, Estimated >03 >47 mL/min    Comment: (NOTE) Calculated using the CKD-EPI Creatinine Equation (2021)    Anion gap 7 5 - 15    Comment: Performed at [facility], [address] [place], [place] [address]  CBC with Differential     Status: Abnormal   Collection Time: 09/07/22 12:01 AM   Result Value Ref Range   WBC 12.6 (H) 4.0 - 10.5 K/uL   RBC 4.42 4.22 - 5.81 MIL/uL   Hemoglobin 14.0 13.0 - 17.0 g/dL   HCT 63.8 75.6 - 43.3 %   MCV 94.1 80.0 - 100.0 fL   MCH 31.7 26.0 - 34.0 pg   MCHC 33.7 30.0 - 36.0 g/dL   RDW 29.5 18.8 - 41.6 %   Platelets 124 (L) 150 - 400 K/uL    Comment: SPECIMEN CHECKED FOR CLOTS  REPEATED TO VERIFY    nRBC 0.0 0.0 - 0.2 %   Neutrophils Relative % 82 %   Neutro Abs 10.4 (H) 1.7 - 7.7 K/uL   Lymphocytes Relative 11 %   Lymphs Abs 1.4 0.7 - 4.0 K/uL   Monocytes Relative 6 %   Monocytes Absolute 0.7 0.1 - 1.0 K/uL   Eosinophils Relative 1 %   Eosinophils Absolute 0.1 0.0 - 0.5 K/uL   Basophils Relative 0 %   Basophils Absolute 0.0 0.0 - 0.1 K/uL   Immature Granulocytes 0 %   Abs Immature Granulocytes 0.05 0.00 - 0.07 K/uL    Comment: Performed at [facility], [address], [place], [place] [address]  Urinalysis, w/ Reflex to Culture (Infection Suspected) -Urine, Clean Catch     Status: Abnormal   Collection Time: 09/07/22 12:01 AM  Result Value Ref Range   Specimen Source URINE, CLEAN CATCH    Color, Urine YELLOW YELLOW   APPearance CLEAR CLEAR   Specific Gravity, Urine 1.021 1.005 - 1.030   pH 5.0 5.0 - 8.0   Glucose, UA NEGATIVE NEGATIVE mg/dL   Hgb urine dipstick NEGATIVE NEGATIVE   Bilirubin Urine NEGATIVE NEGATIVE   Ketones, ur 20 (A) NEGATIVE mg/dL   Protein, ur NEGATIVE NEGATIVE mg/dL   Nitrite NEGATIVE NEGATIVE   Leukocytes,Ua NEGATIVE NEGATIVE   RBC / HPF 0-5 0 - 5 RBC/hpf   WBC, UA 0-5 0 - 5 WBC/hpf    Comment:        Reflex urine culture not performed if WBC <=10, OR if Squamous epithelial cells >5. If Squamous epithelial cells >5 suggest recollection.    Bacteria, UA RARE (A) NONE SEEN   Squamous Epithelial / HPF 0-5 0 - 5 /HPF   Mucus PRESENT    Hyaline Casts, UA PRESENT     Comment: Performed at [facility], [address], [place], [place] [address]  Surgical PCR  screen     Status: None   Collection Time: 09/07/22  4:23 AM   Specimen: Nasal Mucosa; Nasal Swab  Result Value Ref Range   MRSA, PCR NEGATIVE NEGATIVE   Staphylococcus aureus NEGATIVE NEGATIVE    Comment: (NOTE) The Xpert SA Assay (FDA approved for NASAL specimens in patients 71 years of age and older), is one component of a comprehensive surveillance program. It is not intended to diagnose infection nor to guide or monitor treatment. Performed at [facility], [address] [place], [place] [address]     DG Hip Lucienne Capers or Wo Pelvis 2-3 Views Right  Result Date: 09/07/2022 CLINICAL DATA:  Status post fall. EXAM: DG HIP (WITH OR WITHOUT PELVIS) 2-3V RIGHT COMPARISON:  Aug 08, 2022 FINDINGS: There is an acute, impacted fracture deformity involving the neck of the proximal right femur. There is no evidence of dislocation. Mild to moderate severity degenerative changes are seen in the form of joint space narrowing and acetabular sclerosis. IMPRESSION: Acute fracture of the proximal right femur. Electronically Signed   By: Aram Candela M.D.   On: 09/07/2022 00:45   DG Chest Port 1 View  Result Date: 09/07/2022 CLINICAL DATA:  Status post fall. EXAM: PORTABLE CHEST 1 VIEW COMPARISON:  May 06, 2022 FINDINGS: The heart size and mediastinal contours are within normal limits. The lungs are hyperinflated. Mild to moderate severity biapical scarring, atelectasis and pleural thickening are seen. There is no evidence of acute infiltrate, pleural effusion or pneumothorax. The visualized skeletal structures are  unremarkable. IMPRESSION: No active disease. Electronically Signed   By: Aram Candela M.D.   On: 09/07/2022 00:43    Review of Systems Blood pressure (!) 147/61, pulse 94, temperature 98.8 F (37.1 C), resp. rate 18, height 5\' 10"  (1.778 m), weight 52 kg, SpO2 96 %. Physical Exam Vitals reviewed.  Constitutional:      Appearance: Normal appearance.  HENT:      Head: Normocephalic and atraumatic.  Cardiovascular:     Rate and Rhythm: Normal rate.  Pulmonary:     Effort: Pulmonary effort is normal.     Breath sounds: Normal breath sounds.  Abdominal:     Palpations: Abdomen is soft.  Musculoskeletal:     Cervical back: Normal range of motion and neck supple.     Right hip: Tenderness and bony tenderness present. Decreased range of motion. Decreased strength.  Neurological:     Mental Status: He is alert. Mental status is at baseline.  Psychiatric:        Behavior: Behavior normal.     Assessment/Plan: Right hip subcapital femoral neck fracture  The plan is to proceed to surgery today for right total hip arthroplasty through a direct anterior approach.  I had a long and thorough discussion at the bedside about his hip fracture and discussed the risks and benefits of nonoperative versus operative treatment and the different operative treatment choices.  The plan is for surgery later today.  Kathryne Hitch 09/07/2022, 6:59 AM

## 2022-09-07 NOTE — Anesthesia Postprocedure Evaluation (Signed)
Anesthesia Post Note  Patient: Lee Nelson  Procedure(s) Performed: TOTAL HIP ARTHROPLASTY ANTERIOR APPROACH (Right: Hip)     Patient location during evaluation: PACU Anesthesia Type: Spinal Level of consciousness: awake Pain management: pain level controlled Vital Signs Assessment: post-procedure vital signs reviewed and stable Respiratory status: spontaneous breathing, nonlabored ventilation and respiratory function stable Cardiovascular status: blood pressure returned to baseline and stable Postop Assessment: no apparent nausea or vomiting Anesthetic complications: no   No notable events documented.  Last Vitals:  Vitals:   09/07/22 1400 09/07/22 1512  BP: (!) 109/53 (!) 102/56  Pulse: 78 81  Resp: 14 18  Temp:  36.7 C  SpO2: 93% 93%    Last Pain:  Vitals:   09/07/22 1720  TempSrc:   PainSc: 6                  Hera Celaya P Adalia Pettis

## 2022-09-08 ENCOUNTER — Other Ambulatory Visit: Payer: Self-pay

## 2022-09-08 ENCOUNTER — Encounter (HOSPITAL_COMMUNITY): Payer: Self-pay | Admitting: Orthopaedic Surgery

## 2022-09-08 DIAGNOSIS — M25561 Pain in right knee: Secondary | ICD-10-CM

## 2022-09-08 DIAGNOSIS — S7291XA Unspecified fracture of right femur, initial encounter for closed fracture: Secondary | ICD-10-CM | POA: Diagnosis not present

## 2022-09-08 DIAGNOSIS — F0280 Dementia in other diseases classified elsewhere without behavioral disturbance: Secondary | ICD-10-CM

## 2022-09-08 DIAGNOSIS — K2100 Gastro-esophageal reflux disease with esophagitis, without bleeding: Secondary | ICD-10-CM | POA: Diagnosis not present

## 2022-09-08 DIAGNOSIS — E43 Unspecified severe protein-calorie malnutrition: Secondary | ICD-10-CM | POA: Insufficient documentation

## 2022-09-08 DIAGNOSIS — W19XXXA Unspecified fall, initial encounter: Secondary | ICD-10-CM | POA: Diagnosis not present

## 2022-09-08 LAB — BASIC METABOLIC PANEL
Anion gap: 10 (ref 5–15)
BUN: 15 mg/dL (ref 8–23)
CO2: 20 mmol/L — ABNORMAL LOW (ref 22–32)
Calcium: 8.1 mg/dL — ABNORMAL LOW (ref 8.9–10.3)
Chloride: 105 mmol/L (ref 98–111)
Creatinine, Ser: 0.59 mg/dL — ABNORMAL LOW (ref 0.61–1.24)
GFR, Estimated: >60 mL/min (ref >60)
Glucose, Bld: 127 mg/dL — ABNORMAL HIGH (ref 70–99)
Potassium: 4.3 mmol/L (ref 3.5–5.1)
Sodium: 135 mmol/L (ref 135–145)

## 2022-09-08 LAB — CBC
HCT: 36.5 % — ABNORMAL LOW (ref 39.0–52.0)
Hemoglobin: 11.9 g/dL — ABNORMAL LOW (ref 13.0–17.0)
MCH: 31.3 pg (ref 26.0–34.0)
MCHC: 32.6 g/dL (ref 30.0–36.0)
MCV: 96.1 fL (ref 80.0–100.0)
Platelets: 98 10*3/uL — ABNORMAL LOW (ref 150–400)
RBC: 3.8 MIL/uL — ABNORMAL LOW (ref 4.22–5.81)
RDW: 14.6 % (ref 11.5–15.5)
WBC: 12.7 10*3/uL — ABNORMAL HIGH (ref 4.0–10.5)
nRBC: 0 % (ref 0.0–0.2)

## 2022-09-08 MED ORDER — ADULT MULTIVITAMIN W/MINERALS CH
1.0000 | ORAL_TABLET | Freq: Every day | ORAL | Status: DC
  Administered 2022-09-08 – 2022-09-10 (×3): 1 via ORAL
  Filled 2022-09-08 (×2): qty 1

## 2022-09-08 MED ORDER — KATE FARMS STANDARD 1.4 PO LIQD
325.0000 mL | Freq: Two times a day (BID) | ORAL | Status: DC
  Administered 2022-09-08 – 2022-09-10 (×4): 325 mL via ORAL
  Filled 2022-09-08 (×5): qty 325

## 2022-09-08 NOTE — Progress Notes (Signed)
Spiritual Care Note   Attempted visit with Mr Cohron per referral for [facility] chaplain from [facility]/MD[facility], but Mr Eley was sleeping soundly. Visited with his caregiver Deonte and plan to follow up later this afternoon if schedule allows.   Chaplain Rush Barer, MDiv, [facility]) M-F daytime pager [phone] [facility] 24/7 pager [phone] Voicemail [phone]

## 2022-09-08 NOTE — TOC Initial Note (Addendum)
Transition of Care [facility]) - Initial/Assessment Note   Patient Details  Name: Lee Nelson MRN: [id number] Date of Birth: 10/10/1940  Transition of Care [facility]) CM/SW Contact:    Ewing Schlein, LCSW Phone Number: 09/08/2022, 2:43 PM  Clinical Narrative: PT evaluation recommended SNF. CSW spoke with patient's wife regarding recommendation. Per wife, she prefers to take the patient home with [facility] rather than go to rehab as patient did not like his last experience at [facility] in February. Wife reported the patient was previously active with [facility] and she would like to use the same agency. CSW confirmed patient already has a walker at home. CSW made [facility] referral to Cindie with [facility], which was accepted.  Addendum: CSW spoke with patient regarding SNF, but patient continued to be confused regarding time and asked the same questions repeatedly even after CSW answered these questions (ie: what time is it, what is rehab, does insurance cover rehab).   Expected Discharge Plan: Home w Home Health Services Barriers to Discharge: Continued Medical Work up  Patient Goals and CMS Choice Patient states their goals for this hospitalization and ongoing recovery are:: Return home with [facility] CMS Medicare.gov Compare Post Acute Care list provided to:: Patient Represenative (must comment) Choice offered to / list presented to : Spouse  Expected Discharge Plan and Services In-house Referral: Clinical Social Work Discharge Planning Services: NA Post Acute Care Choice: Home Health Living arrangements for the past 2 months: Single Family Home             DME Arranged: N/A DME Agency: NA HH Arranged: PT, OT HH AgencyHotel manager Home Health Care Date [facility] Agency Contacted: 09/08/22 Time HH Agency Contacted: [phone] Representative spoke with at [facility] Agency: Cindie  Prior Living Arrangements/Services Living arrangements for the past 2 months: Single Family Home Lives with:: Spouse Patient language and need for interpreter  reviewed:: Yes Do you feel safe going back to the place where you live?: Yes      Need for Family Participation in Patient Care: Yes (Comment) (Patient has dementia.) Care giver support system in place?: Yes (comment) Current home services: DME (Walker, cane) Criminal Activity/Legal Involvement Pertinent to Current Situation/Hospitalization: No - Comment as needed  Activities of Daily Living Home Assistive Devices/Equipment: Eyeglasses, Blood pressure cuff, Grab bars around toilet, Grab bars in shower, Hand-held shower hose, Wheelchair, Environmental consultant (specify type), Shower chair with back, Scales, Cane (specify quad or straight) ADL Screening (condition at time of admission) Patient's cognitive ability adequate to safely complete daily activities?: Yes Is the patient deaf or have difficulty hearing?: No Does the patient have difficulty seeing, even when wearing glasses/contacts?: No Does the patient have difficulty concentrating, remembering, or making decisions?: Yes ("mild cognitive impairment") Patient able to express need for assistance with ADLs?: Yes Does the patient have difficulty dressing or bathing?: Yes Independently performs ADLs?: No Communication: Independent Dressing (OT): Needs assistance Is this a change from baseline?: Change from baseline, expected to last <3days Grooming: Independent Feeding: Independent Bathing: Needs assistance Is this a change from baseline?: Change from baseline, expected to last <3 days Toileting: Needs assistance Is this a change from baseline?: Change from baseline, expected to last <3 days In/Out Bed: Needs assistance Is this a change from baseline?: Change from baseline, expected to last <3 days Walks in Home: Needs assistance Is this a change from baseline?: Change from baseline, expected to last <3 days Does the patient have difficulty walking or climbing stairs?: Yes Weakness of Legs: Right Weakness of  Arms/Hands: None  Permission  Sought/Granted Permission sought to share information with : Other (comment) Permission granted to share information with : Yes, Verbal Permission Granted Permission granted to share info w AGENCY: [facility]  Emotional Assessment Orientation: : Oriented to Self, Oriented to Place Alcohol / Substance Use: Not Applicable Psych Involvement: No (comment)  Admission diagnosis:  Hip fracture (HCC) [S72.009A] Fall, initial encounter [W19.XXXA] Closed fracture of right hip, initial encounter (HCC) [S72.001A] Patient Active Problem List   Diagnosis Date Noted   Hip fracture (HCC) 09/07/2022   BPH (benign prostatic hyperplasia) 09/07/2022   Closed fracture of right femur, unspecified fracture morphology, initial encounter (HCC) 09/07/2022   Fall 09/07/2022   Closed subcapital fracture of neck of femur, right, initial encounter (HCC) 09/07/2022   Memory impairment 08/06/2022   Pneumonia due to COVID-19 virus 05/06/2022   Severe sepsis (HCC) 05/06/2022   Acute hypoxemic respiratory failure (HCC) 05/06/2022   NPH (normal pressure hydrocephalus) (HCC) 05/06/2022   Physical deconditioning 05/06/2022   Thrombocytopenia (HCC) 05/06/2022   Dementia (HCC) 05/06/2022   SBO (small bowel obstruction) (HCC) 12/31/2019   Amaurosis fugax of right eye 08/25/2017   PSVT (paroxysmal supraventricular tachycardia) 09/12/2015   Chest pain 05/03/2015   Dyspnea 05/03/2015   ALLERGIC RHINITIS 09/21/2007   G E R D 09/21/2007   SMOKE INHALATION 09/21/2007   OSTEOPOROSIS 01/28/2007   PALPITATIONS 01/28/2007   COUGH 01/28/2007   ALLERGY 01/28/2007   PCP:  Chilton Greathouse, MD Pharmacy:   [id number] - [place], [place] - [address] AT [facility] OF [address] [place] [address] Phone: [phone] Fax: [phone]  Social Determinants of Health (SDOH) Social History: SDOH Screenings   Food Insecurity: No Food Insecurity (09/07/2022)  Housing: Low  Risk  (09/07/2022)  Transportation Needs: No Transportation Needs (09/07/2022)  Utilities: Not At Risk (09/07/2022)  Tobacco Use: Low Risk  (09/08/2022)   SDOH Interventions:    Readmission Risk Interventions     No data to display

## 2022-09-08 NOTE — Evaluation (Addendum)
Occupational Therapy Evaluation Patient Details Name: Lee Nelson MRN: [id number] DOB: [birth year] Today's Date: 09/08/2022   History of Present Illness Patient is a 82 year old male admiited with R femur fx after falling at home. 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 Hx of dementia, anxiety, OA, osteoporosis, BPH, skin cancer, PSVT, chronic thrombocytopenia, shingles, COVID, fall, NPH   Clinical Impression   Patient is a 82 year old male who was admitted for above. Patient lives at home with wife and caregiver support at baseline. Patient currently is limited by increased pain in hip with attempts at movement. Patient is TD for LB ADLs at this time with impaired standing balance as patient attempts to self limit weight bearing as pain response. Patient would continue to benefit from skilled OT services at this time while admitted and after d/c to address noted deficits in order to improve overall safety and independence in ADLs.        Recommendations for follow up therapy are one component of a multi-disciplinary discharge planning process, led by the attending physician.  Recommendations may be updated based on patient status, additional functional criteria and insurance authorization.   Assistance Recommended at Discharge Frequent or constant Supervision/Assistance  Patient can return home with the following Two people to help with walking and/or transfers;A lot of help with bathing/dressing/bathroom;Assistance with cooking/housework;Direct supervision/assist for medications management;Assist for transportation;Help with stairs or ramp for entrance;Direct supervision/assist for financial management    Functional Status Assessment  Patient has had a recent decline in their functional status and demonstrates the ability to make significant improvements in function in a reasonable and predictable amount of time.  Equipment Recommendations  None recommended by OT       Precautions / Restrictions  Precautions Precautions: Fall Restrictions Weight Bearing Restrictions: No RLE Weight Bearing: Weight bearing as tolerated      Mobility Bed Mobility               General bed mobility comments: patient was in recliner at start of session and remained in such at end of session per patient request.         Balance Overall balance assessment: Needs assistance, History of Falls   Sitting balance-Leahy Scale: Fair     Standing balance support: Bilateral upper extremity supported, Reliant on assistive device for balance, During functional activity Standing balance-Leahy Scale: Poor Standing balance comment: reliant on device and physical support.         ADL either performed or assessed with clinical judgement   ADL Overall ADL's : Needs assistance/impaired Eating/Feeding: Supervision/ safety;Sitting   Grooming: Set up;Sitting   Upper Body Bathing: Set up;Sitting   Lower Body Bathing: Total assistance;Sitting/lateral leans;Sit to/from stand   Upper Body Dressing : Set up;Sitting   Lower Body Dressing: Maximal assistance;Total assistance;Sitting/lateral leans;Sit to/from Market researcher Details (indicate cue type and reason): patient was max A for sit to stand from recliner with no warning on patient returning to sitting with TD to slow landing into recliner. patient reported increased pain in hip limiting his standing. patient was indecisive with where he wanted to rest for the rest of the day. patient attempted standing x3 with ability to transitoin into standing with posterior leaning noted and limited time off of bottom. patient when x2 assist arrived declined to attempt again until after pain medication has been provided. nurse aware. Toileting- Clothing Manipulation and Hygiene: +2 for safety/equipment;+2 for physical assistance;Sit to/from stand  General ADL Comments: patient is known to this therapist from previous admission in febuary when  patient had COVID 19. patient at that time also had difficutly with using urinal with patient having difficulty with positioning while in recliner to prevent backflow. patient was educated that unfortunately that this was the only urinal the hosptial has available at this time just as last admission. patietn was educated on getting to commode with therapist and nursing staff with patient declining to do that at this time.     Vision Baseline Vision/History: 1 Wears glasses Vision Assessment?: No apparent visual deficits            Pertinent Vitals/Pain Pain Assessment Pain Assessment: Faces Faces Pain Scale: Hurts whole lot Pain Location: R hip/thigh with activity Pain Descriptors / Indicators: Grimacing, Operative site guarding Pain Intervention(s): Limited activity within patient's tolerance, Monitored during session, Patient requesting pain meds-RN notified, RN gave pain meds during session     Hand Dominance Right   Extremity/Trunk Assessment Upper Extremity Assessment Upper Extremity Assessment: Overall WFL for tasks assessed   Lower Extremity Assessment Lower Extremity Assessment: Defer to PT evaluation   Cervical / Trunk Assessment Cervical / Trunk Assessment: Kyphotic   Communication Communication Communication: No difficulties   Cognition Arousal/Alertness: Awake/alert Behavior During Therapy: WFL for tasks assessed/performed Overall Cognitive Status: History of cognitive impairments - at baseline         General Comments: patient is particular about how things are completed with confusion noted. patient is an intellectual at baseline.                Home Living Family/patient expects to be discharged to:: Private residence Living Arrangements: Spouse/significant other Available Help at Discharge: Family;Personal care attendant Type of Home: House Home Access: Ramped entrance;Stairs to enter Entrance Stairs-Number of Steps: 3 Entrance Stairs-Rails:  None Home Layout: Two level;Able to live on main level with bedroom/bathroom     Bathroom Shower/Tub: Walk-in shower         Home Equipment: Cane - single Librarian, academic (2 wheels);Grab bars - tub/shower;Grab bars - toilet          Prior Functioning/Environment Prior Level of Function : Independent/Modified Independent             Mobility Comments: using RW          OT Problem List: Decreased activity tolerance;Impaired balance (sitting and/or standing);Decreased coordination;Decreased safety awareness;Pain;Decreased knowledge of use of DME or AE;Decreased knowledge of precautions      OT Treatment/Interventions: Self-care/ADL training;Energy conservation;Therapeutic exercise;DME and/or AE instruction;Therapeutic activities;Patient/family education;Balance training    OT Goals(Current goals can be found in the care plan section) Acute Rehab OT Goals Patient Stated Goal: to get better urinal OT Goal Formulation: Patient unable to participate in goal setting Time For Goal Achievement: 09/22/22 Potential to Achieve Goals: Fair ADL Goals Pt Will Perform Grooming: standing;with min guard assist Pt Will Perform Lower Body Dressing: with min assist;with adaptive equipment;sitting/lateral leans;sit to/from stand Pt Will Transfer to Toilet: with min guard assist;stand pivot transfer;bedside commode  OT Frequency: Min 2X/week       AM-PAC OT "6 Clicks" Daily Activity     Outcome Measure Help from another person eating meals?: None Help from another person taking care of personal grooming?: A Little Help from another person toileting, which includes using toliet, bedpan, or urinal?: Total Help from another person bathing (including washing, rinsing, drying)?: A Lot Help from another person to put on and taking off regular upper body  clothing?: A Little Help from another person to put on and taking off regular lower body clothing?: A Lot 6 Click Score: 15   End of  Session Equipment Utilized During Treatment: Gait belt;Rolling walker (2 wheels) Nurse Communication: Patient requests pain meds  Activity Tolerance: Patient limited by pain Patient left: in chair;with call bell/phone within reach;with chair alarm set  OT Visit Diagnosis: Unsteadiness on feet (R26.81);Other abnormalities of gait and mobility (R26.89);Pain;History of falling (Z91.81)                Time: 6295-2841 OT Time Calculation (min): 20 min Charges:  OT General Charges $OT Visit: 1 Visit OT Evaluation $OT Eval Low Complexity: 1 Low  Sorah Falkenstein OTR/L, MS Acute Rehabilitation Department Office# [phone]   Selinda Flavin 09/08/2022, 2:50 PM

## 2022-09-08 NOTE — Progress Notes (Signed)
Spiritual Care Note  Provided pastoral presence and empathic listening as Lee Nelson shared and began to process several threads of life experience, including themes of illness, caregiving, and relational needs. 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 He has been a supporter for her and has also seen Lee parents and younger brother decline due to health issues. Lee Nelson has attended [employer] and longs for more relational connection.  Lee Nelson reports that Lee most pressing concern is home health vs a facility for rehab, and he is concerned that past facility experiences have been unpleasant/less constructive than he had hoped.  We plan to follow up tomorrow to continue the conversation if schedules allow.    09/08/22 1644  Spiritual Encounters  Type of Visit Follow up  Care provided to: Patient  Referral source Lee team  Reason for visit Routine spiritual support  OnCall Visit No  Spiritual Framework  Presenting Themes Values and beliefs;Significant life change;Caregiving needs;Impactful experiences and emotions  Community/Connection Family;Faith community;Limited  Needs/Challenges/Barriers caregiving responsibilities and need for care himself  Patient Stress Factors Lack of caregivers;Health changes  Family Stress Factors Lack of caregivers  Interventions  Spiritual Care Interventions Made Explored values/beliefs/practices/strengths  Intervention Outcomes  Outcomes Reduced isolation  Spiritual Care Plan  Spiritual Care Issues Still Outstanding Lee will continue to follow   Lee Nelson, [facility] M-F daytime pager [phone] [facility] 24/7 pager [phone] Voicemail [phone]

## 2022-09-08 NOTE — Progress Notes (Signed)
Chaplain engaged in an initial visit with Lee Nelson. He shared about the significant healthcare changes he has endured along with his wife. He voiced that they both need caregivers. Proctor was providing a lot of support for his wife until his recurrent falls began. Chaplain could assess that the transitions of health and need for care in their lives has been hard on Lee Nelson. He also vocalized that there have been some changes happening cognitively. He recognizes that he has challenges with his short-term memory.  Chaplain worked to find out how Lee Nelson has been doing emotionally and spiritually. Lee Nelson noted that it has been hard. Chaplain could detect some feelings of hopelessness. Lee Nelson then shared that he is of the [employer] tradition. Chaplain recognized that Lee Nelson desires someone of his tradition to offer him encouragement or peace in his current journey. Chaplain did reach out to a [employer] to provided Lee Nelson with additional support.   Chaplain is also going to provide Lee Nelson with some tools such as crossword puzzles and word searches as he struggles with memory. Chaplain offered reflective listening, support, a referral, and resources.   Chaplain Lee Nelson, MDiv  09/08/22 1000  Spiritual Encounters  Type of Visit Initial  Care provided to: Patient  Lee Nelson partners present during encounter Other (comment) (Caregiver)  Reason for visit Routine spiritual support  Spiritual Framework  Presenting Themes Significant life change;Impactful experiences and emotions;Community and relationships  Needs/Challenges/Barriers Health and wellness  Interventions  Spiritual Care Interventions Made Established relationship of care and support;Compassionate presence;Reflective listening  Intervention Outcomes  Outcomes Awareness of support;Connection to spiritual care

## 2022-09-08 NOTE — Evaluation (Signed)
Physical Therapy Evaluation Patient Details Name: Lee Nelson MRN: [id number] DOB: March 27, 1941 Today's Date: 09/08/2022  History of Present Illness  82 yo male admiited with R femur fx after falling at home. 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 Hx of dementia, anxiety, OA, osteoporosis, BPH, skin cancer, PSVT, chronic thrombocytopenia, shingles, COVID, fall, NPH  Clinical Impression  On eval, pt required Min A for mobility. He walked ~50 feet with a RW. Moderate pain reported with activity. Cues and assistance required for safe mobility. He remains at risk for falls. Discussed need for 24/7 supervision/assist if plan is to return home. Will continue to follow and progress activity as tolerated.        Recommendations for follow up therapy are one component of a multi-disciplinary discharge planning process, led by the attending physician.  Recommendations may be updated based on patient status, additional functional criteria and insurance authorization.  Follow Up Recommendations Can patient physically be transported by private vehicle: Yes     Assistance Recommended at Discharge Frequent or constant Supervision/Assistance  Patient can return home with the following  Assist for transportation;Assistance with cooking/housework;Help with stairs or ramp for entrance;A little help with walking and/or transfers;A little help with bathing/dressing/bathroom    Equipment Recommendations None recommended by PT  Recommendations for Other Services  OT consult    Functional Status Assessment Patient has had a recent decline in their functional status and demonstrates the ability to make significant improvements in function in a reasonable and predictable amount of time.     Precautions / Restrictions Precautions Precautions: Fall Restrictions Weight Bearing Restrictions: No RLE Weight Bearing: Weight bearing as tolerated      Mobility  Bed Mobility Overal bed mobility: Needs Assistance Bed Mobility:  Supine to Sit     Supine to sit: Min assist, HOB elevated     General bed mobility comments: Increased time. Some difficulty planning/processing-cues required. Assist for R LE.    Transfers Overall transfer level: Needs assistance Equipment used: Rolling walker (2 wheels) Transfers: Sit to/from Stand Sit to Stand: Min assist, From elevated surface           General transfer comment: Multiple attempts to rise from bed without assistance-pt unable. Cues for safety, technique, hand/LE placement. Assist to power up, stabilize, control descent.    Ambulation/Gait Ambulation/Gait assistance: Min assist Gait Distance (Feet): 50 Feet Assistive device: Rolling walker (2 wheels) Gait Pattern/deviations: Step-to pattern, Trunk flexed       General Gait Details: Cues for safety, RW proximity, posture, step length. Assist to stabilize throughout distance.  Stairs            Wheelchair Mobility    Modified Rankin (Stroke Patients Only)       Balance Overall balance assessment: Needs assistance, History of Falls         Standing balance support: Bilateral upper extremity supported, Reliant on assistive device for balance, During functional activity Standing balance-Leahy Scale: Poor                               Pertinent Vitals/Pain Pain Assessment Pain Assessment: Faces Faces Pain Scale: Hurts even more Pain Location: R hip/thigh with activity Pain Descriptors / Indicators: Grimacing, Operative site guarding Pain Intervention(s): Limited activity within patient's tolerance, Monitored during session, Repositioned    Home Living Family/patient expects to be discharged to:: Private residence Living Arrangements: Spouse/significant other (wife is paraplegic per chart review) Available Help at Discharge: Family;Personal  care attendant Type of Home: House Home Access: Ramped entrance;Stairs to enter (ramp???) Entrance Stairs-Rails: None Entrance  Stairs-Number of Steps: 3   Home Layout: Two level;Able to live on main level with bedroom/bathroom Home Equipment: Cane - single point;Rolling Walker (2 wheels);Grab bars - tub/shower;Grab bars - toilet      Prior Function Prior Level of Function : Independent/Modified Independent             Mobility Comments: using RW       Hand Dominance   Dominant Hand: Right    Extremity/Trunk Assessment   Upper Extremity Assessment Upper Extremity Assessment: Defer to OT evaluation    Lower Extremity Assessment Lower Extremity Assessment: Generalized weakness    Cervical / Trunk Assessment Cervical / Trunk Assessment: Kyphotic  Communication   Communication: No difficulties  Cognition Arousal/Alertness: Awake/alert Behavior During Therapy: WFL for tasks assessed/performed Overall Cognitive Status: History of cognitive impairments - at baseline                                 General Comments: follows commands well. can get distracted at times        General Comments      Exercises     Assessment/Plan    PT Assessment Patient needs continued PT services  PT Problem List Decreased strength;Decreased range of motion;Decreased activity tolerance;Decreased balance;Decreased mobility;Decreased knowledge of use of DME       PT Treatment Interventions DME instruction;Therapeutic exercise;Gait training;Functional mobility training;Therapeutic activities;Patient/family education    PT Goals (Current goals can be found in the Care Plan section)  Acute Rehab PT Goals Patient Stated Goal: regain PLOF/independence PT Goal Formulation: With patient Time For Goal Achievement: 09/22/22 Potential to Achieve Goals: Good    Frequency Min 5X/week     Co-evaluation               AM-PAC PT "6 Clicks" Mobility  Outcome Measure Help needed turning from your back to your side while in a flat bed without using bedrails?: A Little Help needed moving from lying  on your back to sitting on the side of a flat bed without using bedrails?: A Little Help needed moving to and from a bed to a chair (including a wheelchair)?: A Little Help needed standing up from a chair using your arms (e.g., wheelchair or bedside chair)?: A Little Help needed to walk in hospital room?: A Little Help needed climbing 3-5 steps with a railing? : A Lot 6 Click Score: 17    End of Session Equipment Utilized During Treatment: Gait belt Activity Tolerance: Patient limited by fatigue;Patient limited by pain Patient left: in chair;with call bell/phone within reach;with chair alarm set;with family/visitor present   PT Visit Diagnosis: History of falling (Z91.81);Pain;Other abnormalities of gait and mobility (R26.89) Pain - Right/Left: Right Pain - part of body: Hip    Time: 0921-0950 PT Time Calculation (min) (ACUTE ONLY): 29 min   Charges:   PT Evaluation $PT Eval Low Complexity: 1 Low PT Treatments $Gait Training: 8-22 mins           Faye Ramsay, PT Acute Rehabilitation  Office: [phone]

## 2022-09-08 NOTE — Discharge Instructions (Addendum)
INSTRUCTIONS AFTER JOINT REPLACEMENT   Remove items at home which could result in a fall. This includes throw rugs or furniture in walking pathways ICE to the affected joint every three hours while awake for 30 minutes at a time, for at least the first 3-5 days, and then as needed for pain and swelling.  Continue to use ice for pain and swelling. You may notice swelling that will progress down to the foot and ankle.  This is normal after surgery.  Elevate your leg when you are not up walking on it.   Continue to use the breathing machine you got in the hospital (incentive spirometer) which will help keep your temperature down.  It is common for your temperature to cycle up and down following surgery, especially at night when you are not up moving around and exerting yourself.  The breathing machine keeps your lungs expanded and your temperature down.   DIET:  As you were doing prior to hospitalization, we recommend a well-balanced diet.  DRESSING / WOUND CARE / SHOWERING  Keep the surgical dressing until follow up.  The dressing is water proof, so you can shower without any extra covering.  IF THE DRESSING FALLS OFF or the wound gets wet inside, change the dressing with sterile gauze.  Please use good hand washing techniques before changing the dressing.  Do not use any lotions or creams on the incision until instructed by your surgeon.    ACTIVITY  Increase activity slowly as tolerated, but follow the weight bearing instructions below.   No driving for 6 weeks or until further direction given by your physician.  You cannot drive while taking narcotics.  No lifting or carrying greater than 10 lbs. until further directed by your surgeon. Avoid periods of inactivity such as sitting longer than an hour when not asleep. This helps prevent blood clots.  You may return to work once you are authorized by your doctor.     WEIGHT BEARING   Weight bearing as tolerated with assist device (walker, cane,  etc) as directed, use it as long as suggested by your surgeon or therapist, typically at least 4-6 weeks.   EXERCISES  Results after joint replacement surgery are often greatly improved when you follow the exercise, range of motion and muscle strengthening exercises prescribed by your doctor. Safety measures are also important to protect the joint from further injury. Any time any of these exercises cause you to have increased pain or swelling, decrease what you are doing until you are comfortable again and then slowly increase them. If you have problems or questions, call your caregiver or physical therapist for advice.   Rehabilitation is important following a joint replacement. After just a few days of immobilization, the muscles of the leg can become weakened and shrink (atrophy).  These exercises are designed to build up the tone and strength of the thigh and leg muscles and to improve motion. Often times heat used for twenty to thirty minutes before working out will loosen up your tissues and help with improving the range of motion but do not use heat for the first two weeks following surgery (sometimes heat can increase post-operative swelling).   These exercises can be done on a training (exercise) mat, on the floor, on a table or on a bed. Use whatever works the best and is most comfortable for you.    Use music or television while you are exercising so that the exercises are a pleasant break in your   day. This will make your life better with the exercises acting as a break in your routine that you can look forward to.   Perform all exercises about fifteen times, three times per day or as directed.  You should exercise both the operative leg and the other leg as well.  Exercises include:   Quad Sets - Tighten up the muscle on the front of the thigh (Quad) and hold for 5-10 seconds.   Straight Leg Raises - With your knee straight (if you were given a brace, keep it on), lift the leg to 60  degrees, hold for 3 seconds, and slowly lower the leg.  Perform this exercise against resistance later as your leg gets stronger.  Leg Slides: Lying on your back, slowly slide your foot toward your buttocks, bending your knee up off the floor (only go as far as is comfortable). Then slowly slide your foot back down until your leg is flat on the floor again.  Angel Wings: Lying on your back spread your legs to the side as far apart as you can without causing discomfort.  Hamstring Strength:  Lying on your back, push your heel against the floor with your leg straight by tightening up the muscles of your buttocks.  Repeat, but this time bend your knee to a comfortable angle, and push your heel against the floor.  You may put a pillow under the heel to make it more comfortable if necessary.   A rehabilitation program following joint replacement surgery can speed recovery and prevent re-injury in the future due to weakened muscles. Contact your doctor or a physical therapist for more information on knee rehabilitation.    CONSTIPATION  Constipation is defined medically as fewer than three stools per week and severe constipation as less than one stool per week.  Even if you have a regular bowel pattern at home, your normal regimen is likely to be disrupted due to multiple reasons following surgery.  Combination of anesthesia, postoperative narcotics, change in appetite and fluid intake all can affect your bowels.   YOU MUST use at least one of the following options; they are listed in order of increasing strength to get the job done.  They are all available over the counter, and you may need to use some, POSSIBLY even all of these options:    Drink plenty of fluids (prune juice may be helpful) and high fiber foods Colace 100 mg by mouth twice a day  Senokot for constipation as directed and as needed Dulcolax (bisacodyl), take with full glass of water  Miralax (polyethylene glycol) once or twice a day as  needed.  If you have tried all these things and are unable to have a bowel movement in the first 3-4 days after surgery call either your surgeon or your primary doctor.    If you experience loose stools or diarrhea, hold the medications until you stool forms back up.  If your symptoms do not get better within 1 week or if they get worse, check with your doctor.  If you experience "the worst abdominal pain ever" or develop nausea or vomiting, please contact the office immediately for further recommendations for treatment.   ITCHING:  If you experience itching with your medications, try taking only a single pain pill, or even half a pain pill at a time.  You can also use Benadryl over the counter for itching or also to help with sleep.   TED HOSE STOCKINGS:  Use stockings on both   legs until for at least 2 weeks or as directed by physician office. They may be removed at night for sleeping.  MEDICATIONS:  See your medication summary on the "After Visit Summary" that nursing will review with you.  You may have some home medications which will be placed on hold until you complete the course of blood thinner medication.  It is important for you to complete the blood thinner medication as prescribed.  PRECAUTIONS:  If you experience chest pain or shortness of breath - call 911 immediately for transfer to the hospital emergency department.   If you develop a fever greater that 101 F, purulent drainage from wound, increased redness or drainage from wound, foul odor from the wound/dressing, or calf pain - CONTACT YOUR SURGEON.                                                   FOLLOW-UP APPOINTMENTS:  If you do not already have a post-op appointment, please call the office for an appointment to be seen by your surgeon.  Guidelines for how soon to be seen are listed in your "After Visit Summary", but are typically between 1-4 weeks after surgery.  OTHER INSTRUCTIONS:   Knee Replacement:  Do not place pillow  under knee, focus on keeping the knee straight while resting. CPM instructions: 0-90 degrees, 2 hours in the morning, 2 hours in the afternoon, and 2 hours in the evening. Place foam block, curve side up under heel at all times except when in CPM or when walking.  DO NOT modify, tear, cut, or change the foam block in any way.  POST-OPERATIVE OPIOID TAPER INSTRUCTIONS: It is important to wean off of your opioid medication as soon as possible. If you do not need pain medication after your surgery it is ok to stop day one. Opioids include: Codeine, Hydrocodone(Norco, Vicodin), Oxycodone(Percocet, oxycontin) and hydromorphone amongst others.  Long term and even short term use of opiods can cause: Increased pain response Dependence Constipation Depression Respiratory depression And more.  Withdrawal symptoms can include Flu like symptoms Nausea, vomiting And more Techniques to manage these symptoms Hydrate well Eat regular healthy meals Stay active Use relaxation techniques(deep breathing, meditating, yoga) Do Not substitute Alcohol to help with tapering If you have been on opioids for less than two weeks and do not have pain than it is ok to stop all together.  Plan to wean off of opioids This plan should start within one week post op of your joint replacement. Maintain the same interval or time between taking each dose and first decrease the dose.  Cut the total daily intake of opioids by one tablet each day Next start to increase the time between doses. The last dose that should be eliminated is the evening dose.   MAKE SURE YOU:  Understand these instructions.  Get help right away if you are not doing well or get worse.    Thank you for letting us be a part of your medical care team.  It is a privilege we respect greatly.  We hope these instructions will help you stay on track for a fast and full recovery!      High-Calorie, High-Protein Nutrition Therapy (2021) A  high-calorie, high-protein diet has been recommended to you. Your registered dietitian nutritionist (RDN) may have recommended this diet because you are  having difficulty eating enough calories throughout the day, you have lost weight, and/or you need to add protein to your diet. Sometimes you may not feel like eating, even if you know the importance of good nutrition. The recommendations in this handout can help you with the following: Regaining your strength and energy Keeping your body healthy Healing and recovering from surgery or illness and fighting infection Tips: Schedule Your Meals and Snacks Several small meals and snacks are often better tolerated and digested than large meals. Strategies Plan to eat 3 meals and 3 snacks daily. Experiment with timing meals to find out when you have a larger appetite. Appetite may be greatest in the morning after not eating all night so you may prefer to eat your larger meals and snacks in the morning and at lunch. Breakfast-type foods are often better tolerated so eat foods such as eggs, pancakes, waffles and cereal for any meal or snack. Carry snacks with you so you are prepared to eat every 2 to 3 hours. Determine what works best for you if your body's cues for feeling hungry or full are not working. Eat a small meal or snack even if you don't feel hungry. Set a timer to remind you when it is time to eat. Take a walk before you eat (with health care provider's approval). Light or moderate physical activity can help you maintain muscle and increase your appetite. Make Eating Enjoyable Taking steps to make the experience enjoyable may help to increase your interest in eating and improve your appetite. Strategies: Eat with others whenever possible. Include your favorite foods to make meals more enjoyable. Try new foods. Save your beverage for the end of the meal so that you have more room for food before you get full. Add Calories to Your Meals  and Snacks Try adding calorie-dense foods so that each bite provides more nutrition. Strategies Drink milk, chocolate milk, soy milk, or smoothies instead of low-calorie beverages such as diet drinks or water. Cook with milk or soy milk instead of water when making dishes such as hot cereal, cocoa, or pudding. Add jelly, jam, honey, butter or margarine to bread and crackers. Add jam or fruit to ice cream and as a topping over cake. Mix dried fruit, nuts, granola, honey, or dry cereal with yogurt or hot cereals. Enjoy snacks such as milkshakes, smoothies, pudding, ice cream, or custard. Blend a fruit smoothie of a banana, frozen berries, milk or soy milk, and 1 tablespoon nonfat powdered milk or protein powder. Add Protein to Your Meals and Snacks Choose at least one protein food at each meal and snack to increase your daily intake. Strategies Add  cup nonfat dry milk powder or protein powder to make a high-protein milk to drink or to use in recipes that call for milk. Vanilla or peppermint extract or unsweetened cocoa powder could help to boost the flavor. Add hard-cooked eggs, leftover meat, grated cheese, canned beans or tofu to noodles, rice, salads, sandwiches, soups, casseroles, pasta, tuna and other mixed dishes. Add powdered milk or protein powder to hot cereals, meatloaf, casseroles, scrambled eggs, sauces, cream soups, and shakes. Add beans and lentils to salads, soups, casseroles, and vegetable dishes. Eat cottage cheese or yogurt, especially Greek yogurt, with fruit as a snack or dessert. Eat peanut or other nut butters on crackers, bread, toast, waffles, apples, bananas or celery sticks. Add it to milkshakes, smoothies, or desserts. Consider a ready-made protein shake. Your RDN will make recommendations. Add Fats to Your  Meals and Snacks Try adding fats to your meals and snacks. Fat provides more calories in fewer bites than carbohydrate or protein and adds flavors to your  foods. Strategies Snack on nuts and seeds or add them to foods like salads, pasta, cereals, yogurt, and ice cream.  Saut or stir-fry vegetables, meats, chicken, fish or tofu in olive or canola oil.  Add olive oil, other vegetable oils, butter or margarine to soups, vegetables, potatoes, cooked cereal, rice, pasta, bread, crackers, pancakes, or waffles. Snack on olives or add to pasta, pizza, or salad. Add avocado or guacamole to your salads, sandwiches, and other entrees. Include fatty fish such as salmon in your weekly meal plan. For general food safety tips, especially for clients with immunocompromised conditions, ask your RDN for the Food Safety Nutrition Therapy handout. Small Meal and Snack Ideas These snacks and meals are recommended when you have to eat but aren't necessarily hungry.  They are good choices because they are high in protein and high in calories.  2 graham crackers 2 tablespoons peanut or other nut butter 1 cup milk 2 slices whole wheat toast topped with:  avocado, mashed Seasoning of your choice   cup Greek yogurt  cup fruit  cup granola 2 deviled egg halves 5 whole wheat crackers  1 cup cream of tomato soup  grilled cheese sandwich 1 toasted waffle topped with: 2 tablespoons peanut or nut butter 1 tablespoon jam  Trail mix made with:  cup nuts  cup dried fruit  cup cold cereal, any variety  cup oatmeal or cream of wheat cereal 1 tablespoon peanut or nut butter  cup diced fruit   High-Calorie, High-Protein Sample 1-Day Menu View Nutrient Info Breakfast 1 egg, scrambled 1 ounce cheddar cheese 1 English muffin, whole wheat 1 tablespoon margarine 1 tablespoon jam  cup orange juice, fortified with calcium and vitamin D  Morning Snack 1 tablespoon peanut butter 1 banana 1 cup 1% milk  Lunch Tuna salad sandwich made with: 2 slices bread, whole wheat 3 ounces tuna mixed with: 1 tablespoon mayonnaise  cup pudding  Afternoon Snack  cup hummus   cup carrots 1 pita  Evening Meal Enchilada casserole made with: 2 corn tortillas 3 ounces ground beef, cooked  cup black beans, cooked  cup corn, cooked 1 ounce grated cheddar cheese  cup enchilada sauce  avocado, sliced, topping for enchilada 1 tablespoon sour cream, topping for enchilada Salad:  cup lettuce, shredded  cup tomatoes, chopped, for salad 1 tablespoon olive oil and vinegar dressing, for salad  Evening Snack  cup Greek yogurt  cup blueberries  cup granola

## 2022-09-08 NOTE — Progress Notes (Signed)
[facility]                                                                              Lee Nelson, is a 82 y.o. male, DOB - 12/07/1940, ZOX:[id number] Admit date - 09/06/2022    Outpatient Primary MD for the patient is Avva, Ravisankar, MD  LOS - 1  days  Chief Complaint  Patient presents with   Fall   Weakness   Hip Pain    Pt reports severe pain in right hip s/p fall approx. 2100 tonight while ambulating with a cane. 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  Patient reported that he was turning from a dresser at home when he lost his balance and fell forward landing directly on his right hip.  No loss of consciousness or head injury.  Has pain in his right hip however now severe to the right hip and pelvis. Right hip with pelvis x-ray showed acute fracture of the proximal right femur.  Patient was admitted, orthopedics was consulted.  Assessment & Plan    Principal Problem: Acute right subcapital closed fracture of right femur (HCC) -Presented with mechanical fall, acute fracture of the proximal right femur --Vitamin D level 42.6 -Orthopedics consulted, underwent right direct anterior total hip arthroplasty on 6/9, postop day #1 -Pain controlled, PT OT evaluation today -Pain control, DVT prophylaxis per orthopedics -Placed on bowel regimen  Active Problems:   G E R D - not on any PPIs  History of PSVT (paroxysmal supraventricular tachycardia) -Currently stable, NSR    Dementia (HCC), has history of NPH -Fairly alert and oriented, appears close to his baseline    BPH (benign prostatic hyperplasia) -Not on any  medications, UA negative for UTI  Mild leukocytosis -No fevers, possibly reactive -UA negative for UTI, CXR with no active disease -Stable, continue to monitor counts  Chronic thrombocytopenia -PLT 98, trended down, follow closely  Chronic constipation -Will place on bowel regimen  Underweight Estimated body mass index is 16.45 kg/m as calculated from the following:   Height as of this encounter: 5\' 10"  (1.778 m).   Weight as of this encounter: 52 kg.  Code Status: Full code DVT Prophylaxis:  SCDs Start: 09/07/22 1439 Place TED hose Start: 09/07/22 1439 SCDs Start: 09/07/22 0224   Level of Care: Level of care: Med-Surg Family Communication: Updated patient Disposition Plan:      Remains inpatient appropriate: Patient lives at home with his wife who has functional quadriplegia, has scheduled caregivers at home.    Procedures:  09/07/2022: Right direct anterior total hip arthroplasty   Consultants:   Orthopedics  Antimicrobials:   Anti-infectives (From admission, onward)    Start     Dose/Rate Route Frequency Ordered Stop   09/07/22 1700  ceFAZolin (ANCEF) IVPB 1 g/50 mL premix  1 g 100 mL/hr over 30 Minutes Intravenous Every 6 hours 09/07/22 1438 09/07/22 2313   09/07/22 1045  ceFAZolin (ANCEF) IVPB 2g/100 mL premix        2 g 200 mL/hr over 30 Minutes Intravenous  Once 09/07/22 1032 09/07/22 1113   09/07/22 1032  ceFAZolin (ANCEF) 2-4 GM/100ML-% IVPB       Note to Pharmacy: Mortimer Fries A: cabinet override      09/07/22 1032 09/07/22 1100          Medications  aspirin  81 mg Oral BID   docusate sodium  100 mg Oral BID   feeding supplement (KATE FARMS STANDARD 1.4)  325 mL Oral BID BM   multivitamin with minerals  1 tablet Oral Daily   senna-docusate  1 tablet Oral QHS      Subjective:   Lee Nelson was seen and examined today AM.  No acute complaints, doing well.  Pain controlled, no acute issues over night.  Objective:   Vitals:    09/08/22 0150 09/08/22 0157 09/08/22 0601 09/08/22 1306  BP: (!) 153/72  (!) 113/52 (!) 124/56  Pulse: 96  72 94  Resp: 16  17 18   Temp: 99.1 F (37.3 C)  98.8 F (37.1 C) 98.9 F (37.2 C)  TempSrc: Oral  Oral Oral  SpO2: (!) 88% 96% 100% 94%  Weight:      Height:        Intake/Output Summary (Last 24 hours) at 09/08/2022 1332 Last data filed at 09/08/2022 0900 Gross per 24 hour  Intake 600.79 ml  Output 1500 ml  Net -899.21 ml     Wt Readings from Last 3 Encounters:  09/07/22 52 kg  08/06/22 53.9 kg  05/15/22 51.4 kg    Physical Exam General: Alert and oriented x 3, NAD Cardiovascular: S1 S2 clear, RRR.  Respiratory: CTAB Gastrointestinal: Soft, nontender, nondistended, NBS Ext: no pedal edema bilaterally Neuro: no new deficits Psych: Normal affect, has underlying dementia    Data Reviewed:  I have personally reviewed following labs    CBC Lab Results  Component Value Date   WBC 12.7 (H) 09/08/2022   RBC 3.80 (L) 09/08/2022   HGB 11.9 (L) 09/08/2022   HCT 36.5 (L) 09/08/2022   MCV 96.1 09/08/2022   MCH 31.3 09/08/2022   PLT 98 (L) 09/08/2022   MCHC 32.6 09/08/2022   RDW 14.6 09/08/2022   LYMPHSABS 1.4 09/07/2022   MONOABS 0.7 09/07/2022   EOSABS 0.1 09/07/2022   BASOSABS 0.0 09/07/2022     Last metabolic panel Lab Results  Component Value Date   NA 135 09/08/2022   K 4.3 09/08/2022   CL 105 09/08/2022   CO2 20 (L) 09/08/2022   BUN 15 09/08/2022   CREATININE 0.59 (L) 09/08/2022   GLUCOSE 127 (H) 09/08/2022   GFRNONAA >60 09/08/2022   GFRAA >60 01/03/2020   CALCIUM 8.1 (L) 09/08/2022   PHOS 2.3 (L) 01/04/2020   PROT 6.7 09/07/2022   ALBUMIN 4.0 09/07/2022   BILITOT 0.9 09/07/2022   ALKPHOS 93 09/07/2022   AST 27 09/07/2022   ALT 23 09/07/2022   ANIONGAP 10 09/08/2022    CBG (last 3)  No results for input(s): "GLUCAP" in the last 72 hours.    Coagulation Profile: No results for input(s): "INR", "PROTIME" in the last 168  hours.   Radiology Studies: I have personally reviewed the imaging studies  DG Pelvis Portable  Result Date: 09/07/2022 CLINICAL DATA:  Status post right hip  replacement. EXAM: PORTABLE PELVIS 1-2 VIEWS COMPARISON:  Preoperative radiograph FINDINGS: Right hip arthroplasty in expected alignment. No periprosthetic lucency or fracture. Recent postsurgical change includes air and edema in the soft tissues. Lateral skin staples in place. IMPRESSION: Right hip arthroplasty without immediate postoperative complication. Electronically Signed   By: Narda Rutherford M.D.   On: 09/07/2022 13:09   DG HIP UNILAT WITH PELVIS 1V RIGHT  Result Date: 09/07/2022 CLINICAL DATA:  Elective surgery. EXAM: DG HIP (WITH OR WITHOUT PELVIS) 1V RIGHT COMPARISON:  Preoperative radiograph FINDINGS: Three fluoroscopic spot views of the pelvis and right hip obtained in the operating room. Images during hip arthroplasty. Fluoroscopy time 19 seconds. Dose 1.229 mGy. IMPRESSION: Intraoperative fluoroscopy during right hip arthroplasty. Electronically Signed   By: Narda Rutherford M.D.   On: 09/07/2022 12:21   DG C-Arm 1-60 Min-No Report  Result Date: 09/07/2022 Fluoroscopy was utilized by the requesting physician.  No radiographic interpretation.   DG C-Arm 1-60 Min-No Report  Result Date: 09/07/2022 Fluoroscopy was utilized by the requesting physician.  No radiographic interpretation.   DG Hip Unilat W or Wo Pelvis 2-3 Views Right  Result Date: 09/07/2022 CLINICAL DATA:  Status post fall. EXAM: DG HIP (WITH OR WITHOUT PELVIS) 2-3V RIGHT COMPARISON:  Aug 08, 2022 FINDINGS: There is an acute, impacted fracture deformity involving the neck of the proximal right femur. There is no evidence of dislocation. Mild to moderate severity degenerative changes are seen in the form of joint space narrowing and acetabular sclerosis. IMPRESSION: Acute fracture of the proximal right femur. Electronically Signed   By: Aram Candela M.D.   On:  09/07/2022 00:45   DG Chest Port 1 View  Result Date: 09/07/2022 CLINICAL DATA:  Status post fall. EXAM: PORTABLE CHEST 1 VIEW COMPARISON:  May 06, 2022 FINDINGS: The heart size and mediastinal contours are within normal limits. The lungs are hyperinflated. Mild to moderate severity biapical scarring, atelectasis and pleural thickening are seen. There is no evidence of acute infiltrate, pleural effusion or pneumothorax. The visualized skeletal structures are unremarkable. IMPRESSION: No active disease. Electronically Signed   By: Aram Candela M.D.   On: 09/07/2022 00:43       Carlin Mamone M.D. [facility] 09/08/2022, 1:32 PM  Available via Epic secure chat 7am-7pm After 7 pm, please refer to night coverage provider listed on amion.

## 2022-09-08 NOTE — Progress Notes (Signed)
   09/08/22 2012  Assess: MEWS Score  Temp 99.3 F (37.4 C)  BP 129/61  MAP (mmHg) 77  Pulse Rate (!) 120  ECG Heart Rate (!) 120  Resp 16  SpO2 92 %  O2 Device Room Air  Assess: MEWS Score  MEWS Temp 0  MEWS Systolic 0  MEWS Pulse 2  MEWS RR 0  MEWS LOC 0  MEWS Score 2  MEWS Score Color Yellow  Assess: if the MEWS score is Yellow or Red  Were vital signs taken at a resting state? Yes  Focused Assessment Change from prior assessment (see assessment flowsheet)  Does the patient meet 2 or more of the SIRS criteria? No  MEWS guidelines implemented  Yes, yellow  Treat  MEWS Interventions Considered administering scheduled or prn medications/treatments as ordered  Take Vital Signs  Increase Vital Sign Frequency  Yellow: Q2hr x1, continue Q4hrs until patient remains green for 12hrs  Escalate  MEWS: Escalate Yellow: Discuss with charge nurse and consider notifying provider and/or RRT  Notify: Charge Nurse/RN  Name of Charge Nurse/RN Notified Earney Mallet, RN  Provider Notification  Provider Name/Title Luiz Iron, NP  Date Provider Notified 09/08/22  Time Provider Notified 2035  Method of Notification Page (Chat)  Notification Reason Change in status  Provider response Other (Comment) (Reassess after pain meds given)  Date of Provider Response 09/08/22  Time of Provider Response 2038  Assess: SIRS CRITERIA  SIRS Temperature  0  SIRS Pulse 1  SIRS Respirations  0  SIRS WBC 0  SIRS Score Sum  1

## 2022-09-08 NOTE — Progress Notes (Addendum)
Initial Nutrition Assessment  DOCUMENTATION CODES:   Severe malnutrition in context of chronic illness, Underweight  INTERVENTION:   -[employer] 1.4 PO BID, each provides 455 kcals and 20g protein  -Multivitamin with minerals daily  -Placed "High Calorie, High Protein" handout in AVS  NUTRITION DIAGNOSIS:   Severe Malnutrition related to chronic illness (dementia) as evidenced by severe fat depletion, severe muscle depletion.  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 Right hip with pelvis x-ray showed acute fracture of the proximal right femur.  6/9: s/p Right direct anterior total hip arthroplasty   Patient in room, later joined by homecare team. Pt reports he eats 3 meals a day at home. Has not experienced any significant issues with swallowing or chewing. Pt eating oatmeal mixed with milk at time of visit, states it was too hot at first. He also states he ate some fruit this morning. Per caretaker at bedside, pt consumes Molli Posey at home as a supplement, will order for this  admission given pt's malnutrition and post-op needs. Noted pt's soy allergy. Pt states he takes vitamins at home but was unable to state them. Per home meds, pt takes B complex, Vitamin D3, magnesium, Vitamin K, L- carnitine, Lion's mane, Vitamin C, Zinc.   Per weight records, pt weighs ~114 lbs. States his UBW ~ 118 lbs.  Medications: Colace, Senokot   Labs reviewed.  NUTRITION - FOCUSED PHYSICAL EXAM:  Flowsheet Row Most Recent Value  Orbital Region Severe depletion   Upper Arm Region Severe depletion  Thoracic and Lumbar Region Severe depletion  Buccal Region Moderate depletion  Temple Region Severe depletion  Clavicle Bone Region Severe depletion  Clavicle and Acromion Bone Region Severe depletion  Scapular Bone Region Severe depletion  Dorsal Hand Severe depletion  Patellar Region Moderate depletion  Anterior Thigh Region Moderate depletion  Posterior Calf Region Moderate depletion  Edema (RD Assessment) Mild  [BLE]  Hair Reviewed  Eyes Reviewed  Mouth Reviewed  Skin Reviewed       Diet Order:   Diet Order             Diet regular Room service appropriate? Yes; Fluid consistency: Thin  Diet effective now                   EDUCATION NEEDS:   Education needs have been addressed  Skin:  Skin Assessment: Skin Integrity Issues: Skin Integrity Issues:: Incisions Incisions: 6/9 right hip  Last BM:  6/7  Height:   Ht Readings from Last 1 Encounters:  09/07/22 5\' 10"  (1.778 m)    Weight:   Wt Readings from Last 1 Encounters:  09/07/22 52 kg    BMI:  Body mass index is 16.45 kg/m.  Estimated Nutritional Needs:   Kcal:  2000-2200  Protein:  100-115g  Fluid:  2L/day  Tilda Franco, MS, RD, LDN Inpatient Clinical Dietitian Contact information available via Amion

## 2022-09-09 DIAGNOSIS — F02818 Dementia in other diseases classified elsewhere, unspecified severity, with other behavioral disturbance: Secondary | ICD-10-CM

## 2022-09-09 DIAGNOSIS — S72011A Unspecified intracapsular fracture of right femur, initial encounter for closed fracture: Secondary | ICD-10-CM | POA: Diagnosis not present

## 2022-09-09 DIAGNOSIS — W19XXXA Unspecified fall, initial encounter: Secondary | ICD-10-CM | POA: Diagnosis not present

## 2022-09-09 DIAGNOSIS — S72001A Fracture of unspecified part of neck of right femur, initial encounter for closed fracture: Secondary | ICD-10-CM | POA: Diagnosis not present

## 2022-09-09 LAB — BASIC METABOLIC PANEL
Anion gap: 5 (ref 5–15)
BUN: 14 mg/dL (ref 8–23)
CO2: 28 mmol/L (ref 22–32)
Calcium: 8.2 mg/dL — ABNORMAL LOW (ref 8.9–10.3)
Chloride: 102 mmol/L (ref 98–111)
Creatinine, Ser: 0.62 mg/dL (ref 0.61–1.24)
GFR, Estimated: >60 mL/min (ref >60)
Glucose, Bld: 107 mg/dL — ABNORMAL HIGH (ref 70–99)
Potassium: 3.9 mmol/L (ref 3.5–5.1)
Sodium: 135 mmol/L (ref 135–145)

## 2022-09-09 LAB — CBC
HCT: 32 % — ABNORMAL LOW (ref 39.0–52.0)
Hemoglobin: 10.7 g/dL — ABNORMAL LOW (ref 13.0–17.0)
MCH: 31.7 pg (ref 26.0–34.0)
MCHC: 33.4 g/dL (ref 30.0–36.0)
MCV: 94.7 fL (ref 80.0–100.0)
Platelets: 83 10*3/uL — ABNORMAL LOW (ref 150–400)
RBC: 3.38 MIL/uL — ABNORMAL LOW (ref 4.22–5.81)
RDW: 14.7 % (ref 11.5–15.5)
WBC: 9.2 10*3/uL (ref 4.0–10.5)
nRBC: 0 % (ref 0.0–0.2)

## 2022-09-09 MED ORDER — LORAZEPAM 0.5 MG PO TABS
0.5000 mg | ORAL_TABLET | Freq: Three times a day (TID) | ORAL | Status: DC | PRN
  Administered 2022-09-09: 0.5 mg via ORAL
  Filled 2022-09-09: qty 1

## 2022-09-09 MED ORDER — ACETAMINOPHEN 500 MG PO TABS: 500.0000 mg | ORAL_TABLET | Freq: Four times a day (QID) | ORAL | 2 refills | Status: DC | PRN

## 2022-09-09 MED ORDER — ASPIRIN 81 MG PO CHEW: 81.0000 mg | CHEWABLE_TABLET | Freq: Two times a day (BID) | ORAL | 0 refills | Status: AC

## 2022-09-09 MED ORDER — POLYETHYLENE GLYCOL 3350 17 G PO PACK: 17.0000 g | PACK | Freq: Every day | ORAL | 0 refills | Status: AC | PRN

## 2022-09-09 MED ORDER — ONDANSETRON HCL 4 MG PO TABS: 4.0000 mg | ORAL_TABLET | Freq: Four times a day (QID) | ORAL | 0 refills | Status: AC | PRN

## 2022-09-09 MED ORDER — HYDROCODONE-ACETAMINOPHEN 5-325 MG PO TABS: 1.0000 | ORAL_TABLET | Freq: Four times a day (QID) | ORAL | 0 refills | Status: DC | PRN

## 2022-09-09 MED ORDER — DOCUSATE SODIUM 100 MG PO CAPS: 100.0000 mg | ORAL_CAPSULE | Freq: Two times a day (BID) | ORAL | 0 refills | Status: AC

## 2022-09-09 NOTE — Progress Notes (Signed)
Spiritual Care Note  Followed up with Lee Nelson briefly this afternoon, providing pastoral support as he shared and processed his feeling rushed about making a discharge plan and continuing to try to discern whether he would prefer to discharge home with familiar caregivers or to a facility. Consulted with RN, who is aware of the complexity of the situation and who also consulted with LCSW regarding his most current questions.    09/09/22 1554  Spiritual Encounters  Type of Visit Follow up  Care provided to: Patient  Goals  Additional Comment(s) pt focused on anxiety about discharge plan  Interventions  Spiritual Care Interventions Made Reflective listening;Compassionate presence  Intervention Outcomes  Outcomes Connection to values and goals of care;Autonomy/agency    As discharge is targeted for tomorrow, this may have been our last encounter.  Chaplain Rush Barer, MDiv, [facility] pager [phone]

## 2022-09-09 NOTE — Progress Notes (Signed)
MEWS Progress Note  Patient Details Name: Lee Nelson MRN: [id number] DOB: [birth year] Today's Date: 09/09/2022   MEWS Flowsheet Documentation:  Assess: MEWS Score Temp: 98.2 F (36.8 C) BP: 124/62 MAP (mmHg): 78 Pulse Rate: (!) 114 ECG Heart Rate: (!) 120 Resp: 16 Level of Consciousness: Alert SpO2: 93 % O2 Device: Room Air Patient Activity (if Appropriate): In bed O2 Flow Rate (L/min): 2 L/min Assess: MEWS Score MEWS Temp: 0 MEWS Systolic: 0 MEWS Pulse: 2 MEWS RR: 0 MEWS LOC: 0 MEWS Score: 2 MEWS Score Color: Yellow Assess: SIRS CRITERIA SIRS Temperature : 0 SIRS Respirations : 0 SIRS Pulse: 1 SIRS WBC: 0 SIRS Score Sum : 1 Assess: if the MEWS score is Yellow or Red Were vital signs taken at a resting state?: Yes Focused Assessment: No change from prior assessment (pateint has anxiety and dementia) Does the patient meet 2 or more of the SIRS criteria?: No MEWS guidelines implemented : No, previously yellow, continue vital signs every 4 hours Notify: Charge Nurse/RN Name of Charge Nurse/RN Notified: Marni Griffon Provider Notification Provider Name/Title: Dr Isidoro Donning Date Provider Notified: 09/09/22 Time Provider Notified: 1432 Method of Notification: Page Notification Reason: Other (Comment) Provider response: See new orders Date of Provider Response: 09/09/22 Time of Provider Response: 1445      Marni Griffon Ione 09/09/2022, 3:16 PM

## 2022-09-09 NOTE — NC FL2 (Signed)
[place] MEDICAID FL2 LEVEL OF CARE FORM     IDENTIFICATION  Patient Name: Lee Nelson Birthdate: 12-09-1940 Sex: male Admission Date (Current Location): 09/06/2022  [facility] and [place] Number:  Producer, television/film/video and Address:  [facility],  [address] [place], [place] [address]      Provider Number: [id number]  Attending Physician Name and Address:  Cathren Harsh, MD  Relative Name and Phone Number:  Felipe Drone (spouse) Ph: [phone]    Current Level of Care: Hospital Recommended Level of Care: Skilled Nursing Facility Prior Approval Number:    Date Approved/Denied:   PASRR Number: [id number]  Discharge Plan: SNF    Current Diagnoses: Patient Active Problem List   Diagnosis Date Noted   Protein-calorie malnutrition, severe 09/08/2022   Hip fracture (HCC) 09/07/2022   BPH (benign prostatic hyperplasia) 09/07/2022   Closed fracture of right femur, unspecified fracture morphology, initial encounter (HCC) 09/07/2022   Fall 09/07/2022   Closed subcapital fracture of neck of femur, right, initial encounter (HCC) 09/07/2022   Memory impairment 08/06/2022   Pneumonia due to COVID-19 virus 05/06/2022   Severe sepsis (HCC) 05/06/2022   Acute hypoxemic respiratory failure (HCC) 05/06/2022   NPH (normal pressure hydrocephalus) (HCC) 05/06/2022   Physical deconditioning 05/06/2022   Thrombocytopenia (HCC) 05/06/2022   Dementia (HCC) 05/06/2022   SBO (small bowel obstruction) (HCC) 12/31/2019   Amaurosis fugax of right eye 08/25/2017   PSVT (paroxysmal supraventricular tachycardia) 09/12/2015   Chest pain 05/03/2015   Dyspnea 05/03/2015   ALLERGIC RHINITIS 09/21/2007   G E R D 09/21/2007   SMOKE INHALATION 09/21/2007   OSTEOPOROSIS 01/28/2007   PALPITATIONS 01/28/2007   COUGH 01/28/2007   ALLERGY 01/28/2007    Orientation RESPIRATION BLADDER Height & Weight     Self, Place  Normal Incontinent Weight: 114 lb 10.2 oz (52 kg) Height:  5\' 10"   (177.8 cm)  BEHAVIORAL SYMPTOMS/MOOD NEUROLOGICAL BOWEL NUTRITION STATUS     (N/A) Continent Diet (Regular diet)  AMBULATORY STATUS COMMUNICATION OF NEEDS Skin   Extensive Assist Verbally Surgical wounds, Skin abrasions (Abrasion: right elbow)                       Personal Care Assistance Level of Assistance  Bathing, Feeding, Dressing Bathing Assistance: Limited assistance Feeding assistance: Independent Dressing Assistance: Limited assistance     Functional Limitations Info  Sight, Hearing, Speech Sight Info: Impaired Hearing Info: Adequate Speech Info: Adequate    SPECIAL CARE FACTORS FREQUENCY  PT (By licensed PT), OT (By licensed OT)     PT Frequency: 5x's/week OT Frequency: 5x's/week            Contractures Contractures Info: Not present    Additional Factors Info  Code Status, Allergies, Psychotropic Code Status Info: Full Allergies Info: Ciprofloxacin, Flagyl (Metronidazole), Metoclopramide Hcl, Other, Propofol, Risperidone, Silodosin, Soy Allergy, Sulfamethoxazole-trimethoprim Psychotropic Info: Ativan         Current Medications (09/09/2022):  This is the current hospital active medication list Current Facility-Administered Medications  Medication Dose Route Frequency Provider Last Rate Last Admin   0.9 %  sodium chloride infusion   Intravenous Continuous Kathryne Hitch, MD 75 mL/hr at 09/07/22 1450 New Bag at 09/07/22 1450   acetaminophen (TYLENOL) tablet 325-650 mg  325-650 mg Oral Q6H PRN Kathryne Hitch, MD       aspirin chewable tablet 81 mg  81 mg Oral BID Kathryne Hitch, MD   81 mg at 09/09/22  0810   docusate sodium (COLACE) capsule 100 mg  100 mg Oral BID Kathryne Hitch, MD   100 mg at 09/09/22 0810   feeding supplement (KATE FARMS STANDARD 1.4) liquid 325 mL  325 mL Oral BID BM Rai, Ripudeep K, MD   325 mL at 09/09/22 1140   food thickener (SIMPLYTHICK (NECTAR/LEVEL 2/MILDLY THICK)) 4 packet  4 packet Oral  PRN Kathryne Hitch, MD       HYDROcodone-acetaminophen (NORCO) 7.5-325 MG per tablet 1-2 tablet  1-2 tablet Oral Q4H PRN Kathryne Hitch, MD       HYDROcodone-acetaminophen (NORCO/VICODIN) 5-325 MG per tablet 1-2 tablet  1-2 tablet Oral Q6H PRN Kathryne Hitch, MD   1 tablet at 09/07/22 1726   HYDROcodone-acetaminophen (NORCO/VICODIN) 5-325 MG per tablet 1-2 tablet  1-2 tablet Oral Q4H PRN Kathryne Hitch, MD   1 tablet at 09/09/22 0810   LORazepam (ATIVAN) tablet 0.5 mg  0.5 mg Oral Q8H PRN Rai, Ripudeep K, MD   0.5 mg at 09/09/22 1515   menthol-cetylpyridinium (CEPACOL) lozenge 3 mg  1 lozenge Oral PRN Kathryne Hitch, MD       Or   phenol (CHLORASEPTIC) mouth spray 1 spray  1 spray Mouth/Throat PRN Kathryne Hitch, MD       morphine (PF) 2 MG/ML injection 0.5 mg  0.5 mg Intravenous Q2H PRN Kathryne Hitch, MD   0.5 mg at 09/07/22 0758   morphine (PF) 2 MG/ML injection 0.5-1 mg  0.5-1 mg Intravenous Q2H PRN Kathryne Hitch, MD   1 mg at 09/07/22 2014   multivitamin with minerals tablet 1 tablet  1 tablet Oral Daily Rai, Ripudeep K, MD   1 tablet at 09/09/22 0810   ondansetron (ZOFRAN) tablet 4 mg  4 mg Oral Q6H PRN Kathryne Hitch, MD       Or   ondansetron [facility]) injection 4 mg  4 mg Intravenous Q6H PRN Kathryne Hitch, MD       polyethylene glycol (MIRALAX / GLYCOLAX) packet 17 g  17 g Oral Daily PRN Kathryne Hitch, MD       senna-docusate (Senokot-S) tablet 1 tablet  1 tablet Oral QHS Kathryne Hitch, MD   1 tablet at 09/07/22 2012     Discharge Medications: Please see discharge summary for a list of discharge medications.  Relevant Imaging Results:  Relevant Lab Results:   Additional Information SSN: [id number]  Ewing Schlein, LCSW

## 2022-09-09 NOTE — Progress Notes (Signed)
Patient ID: Lee Nelson, male   DOB: September 28, 1940, 82 y.o.   MRN: [id number] I did come by the bedside this evening to check on the patient.  I was able to show him the x-rays of his hip replacement.  He is a little tachycardic this evening.  He has received a little bit of Ativan but he follows my commands appropriately.  He told me that he understands that he is being "sent away" but he knows that that means short-term skilled nursing in order to maximize his mobility given that he is a fall risk.  I explained in length what this means and it is important for his rehabilitation so he does not fall.

## 2022-09-09 NOTE — Discharge Summary (Signed)
Physician Discharge Summary   Patient: Lee Nelson MRN: [id number] DOB: 1941/02/24  Admit date:     09/06/2022  Discharge date: 09/09/22  Discharge Physician: Thad Ranger, MD    PCP: Chilton Greathouse, MD   Recommendations at discharge:   Weightbearing as tolerated, continue PT OT Aspirin 81 mg twice daily for 4 weeks for DVT prophylaxis Outpatient follow-up with Dr. 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  Patient reported that he was turning from a dresser at home when he lost his balance and fell forward landing directly on his right hip.  No loss of consciousness or head injury.  Has pain in his right hip however now severe to the right hip and pelvis. Right hip with pelvis x-ray showed acute fracture of the proximal right femur.  Patient was admitted, orthopedics was consulted.  Assessment and Plan:  Acute right subcapital closed fracture of right femur (HCC) -Presented with mechanical fall, acute fracture of the proximal right femur --Vitamin D level 42.6 -Orthopedics consulted, underwent right direct anterior total hip arthroplasty on 6/9, postop day # 2 -Pain controlled, continue PT OT, home health -Weightbearing as tolerated, aspirin 81 mg twice daily for 4 weeks for DVT prophylaxis Outpatient follow-up with Dr. Simeon Craft in 2 weeks      G E R D - not on any PPIs   History of PSVT (paroxysmal supraventricular tachycardia) -Currently  stable, NSR     Dementia (HCC), has history of NPH -Fairly alert and oriented, appears close to his baseline     BPH (benign prostatic hyperplasia) -Not on any medications, UA negative for UTI   Mild leukocytosis -No fevers, possibly reactive -UA negative for UTI, CXR with no active disease -Resolved   Chronic thrombocytopenia -Follow counts outpatient  Chronic constipation -Continue Colace, MiraLAX while on narcotics for pain control   Underweight, severe protein calorie malnutrition Estimated body mass index is 16.45 kg/m as calculated from the following:   Height as of this encounter: 5\' 10"  (1.778 m).   Weight as of this encounter: 52 kg.     Pain control - [employer] Controlled Substance Reporting System database was reviewed. and patient was instructed, not to drive, operate heavy machinery, perform activities at heights, swimming or participation in water activities or provide baby-sitting services while on Pain, Sleep and Anxiety Medications; until their outpatient Physician has advised to do so again. Also recommended to not to take more than prescribed Pain, Sleep and Anxiety Medications.  Consultants: Orthopedics Procedures performed:  Right direct anterior total hip arthroplasty  Disposition: Home Diet recommendation:  Discharge Diet Orders (From admission, onward)     Start     Ordered   09/09/22 0000  Diet - low sodium heart healthy        09/09/22 1334            DISCHARGE MEDICATION: Allergies as of 09/09/2022       Reactions   Ciprofloxacin    JOINT PAIN  Flagyl [metronidazole] Other (See Comments)   REACTION: no appetite, diarrhea after meal, decrease in weight   Metoclopramide Hcl Other (See Comments)   REACTION: "involuntary movements"   Other Other (See Comments)   Antibiotics have unknown reaction propophol causes memory problems   Propofol Other (See Comments)   Other Reaction(s): Other (See Comments) Memory problems   Risperidone  Other (See Comments)   "do not want"   Silodosin    ? Possibly allergy, could not breathe well out of nose   Soy Allergy Other (See Comments)   Stomach upset, "gas" Other Reaction(s): Other (See Comments)   Sulfamethoxazole-trimethoprim Other (See Comments)   REACTION: "involuntary movements" tripac antibiotic- heart rythm Other Reaction(s): Other (See Comments) Unknown reaction        Medication List     TAKE these medications    acetaminophen 500 MG tablet Commonly known as: TYLENOL Take 1 tablet (500 mg total) by mouth every 6 (six) hours as needed for mild pain, fever or headache (over the counter).   aspirin 81 MG chewable tablet Chew 1 tablet (81 mg total) by mouth 2 (two) times daily.   Cranberry 400 MG Caps Take 400-1,200 mg by mouth every morning.   docusate sodium 100 MG capsule Commonly known as: COLACE Take 1 capsule (100 mg total) by mouth 2 (two) times daily.   HYDROcodone-acetaminophen 5-325 MG tablet Commonly known as: NORCO/VICODIN Take 1-2 tablets by mouth every 6 (six) hours as needed for moderate pain or severe pain.   MAGNESIUM CITRATE PO Take 75 mg by mouth daily.   melatonin 3 MG Tabs tablet Take 1 tablet (3 mg total) by mouth at bedtime.   NON FORMULARY Take 1 capsule by mouth daily. Acetyl L carnitine   ondansetron 4 MG tablet Commonly known as: ZOFRAN Take 1 tablet (4 mg total) by mouth every 6 (six) hours as needed for nausea or vomiting.   OVER THE COUNTER MEDICATION Take 3 capsules by mouth 2 (two) times daily. Lion's mane 0.5 gram/capsule   OVER THE COUNTER MEDICATION Take 2 capsules by mouth every morning. Reparagen supplement   Phosphatidylserine 100 MG Caps Take 100 mg by mouth every morning.   polyethylene glycol 17 g packet Commonly known as: MIRALAX / GLYCOLAX Take 17 g by mouth daily as needed for mild constipation or moderate constipation.   PROBIOTIC DAILY PO Take 1 capsule by mouth daily.   senna-docusate  8.6-50 MG tablet Commonly known as: Senokot-S Take 1 tablet by mouth at bedtime.   sodium chloride 5 % ophthalmic ointment Commonly known as: MURO 128 Place 1 application into both eyes at bedtime.   Systane Balance 0.6 % Soln Generic drug: Propylene Glycol Apply 1 drop to eye daily as needed (dry eyes).   UNABLE TO FIND Med Name: cocovia memory   Vitamin B Complex Tabs Take 1 tablet by mouth daily.   vitamin C 250 MG tablet Commonly known as: ASCORBIC ACID Take 250 mg by mouth daily.   Vitamin D-3 25 MCG (1000 UT) Caps Take 2,000 Units by mouth daily.   VITAMIN K2 PO Take 120-240 mg by mouth daily. Combo w/ Vit. D   zinc sulfate 220 (50 Zn) MG capsule Take 1 capsule (220 mg total) by mouth daily.               Durable Medical Equipment  (From admission, onward)           Start     Ordered   09/07/22 1439  DME 3 n 1  Once        09/07/22 1438   09/07/22 1439  DME Walker rolling  Once       Question Answer Comment  Walker: With 5 Inch Wheels   Patient needs a walker to treat with the following condition Status post total replacement of right hip      09/07/22 1438              Discharge Care Instructions  (From admission, onward)           Start     Ordered   09/09/22 0000  Leave dressing on - Keep it clean, dry, and intact until clinic visit        09/09/22 1334            Follow-up Information     Kathryne Hitch, MD. Schedule an appointment as soon as possible for a visit in 2 week(s).   Specialty: Orthopedic Surgery Contact information: [address] [place] [address] [phone]         [facility], [facility] Follow up.   Specialty: Home Health Services Why: [facility] will provide PT and OT in the home after discharge. Contact information: [address] [place] [address] [phone]                Discharge Exam: Filed Weights   09/06/22 2215 09/07/22 0300  Weight: 51.3 kg  52 kg   S: No acute complaints, has underlying dementia, pain controlled.  Tolerating diet without any difficulty.  BP 124/62 (BP Location: Left Arm)   Pulse (!) 114   Temp 98.2 F (36.8 C)   Resp 16   Ht 5\' 10"  (1.778 m)   Wt 52 kg   SpO2 93%   BMI 16.45 kg/m   Physical Exam General: Alert and oriented x self and place,  NAD Cardiovascular: S1 S2 clear, RRR.  Respiratory: CTAB, no wheezing, rales or rhonchi Gastrointestinal: Soft, nontender, nondistended, NBS Ext: no pedal edema bilaterally Neuro: no new deficits Psych:  underlying dementia   Condition at discharge: fair  The results of significant diagnostics from this hospitalization (including imaging, microbiology, ancillary and laboratory) are listed below for reference.   Imaging Studies: DG Pelvis Portable  Result Date: 09/07/2022 CLINICAL DATA:  Status post right hip replacement. EXAM: PORTABLE PELVIS 1-2 VIEWS COMPARISON:  Preoperative radiograph FINDINGS: Right hip arthroplasty in expected alignment. No periprosthetic lucency or fracture. Recent postsurgical change includes air and edema in the soft tissues. Lateral skin staples in place. IMPRESSION: Right hip arthroplasty without immediate postoperative complication. Electronically Signed   By: Narda Rutherford M.D.   On: 09/07/2022 13:09   DG HIP UNILAT WITH PELVIS 1V RIGHT  Result Date: 09/07/2022 CLINICAL DATA:  Elective surgery. EXAM: DG HIP (WITH OR WITHOUT PELVIS) 1V RIGHT COMPARISON:  Preoperative radiograph FINDINGS: Three fluoroscopic spot views of the pelvis and right hip obtained in the operating room. Images during hip arthroplasty. Fluoroscopy time 19 seconds. Dose 1.229 mGy. IMPRESSION: Intraoperative fluoroscopy during right hip arthroplasty. Electronically Signed   By: Narda Rutherford M.D.   On: 09/07/2022 12:21   DG C-Arm 1-60 Min-No Report  Result Date: 09/07/2022 Fluoroscopy was utilized by the requesting physician.  No radiographic interpretation.    DG C-Arm 1-60 Min-No Report  Result Date: 09/07/2022 Fluoroscopy was utilized by the requesting physician.  No radiographic interpretation.   DG Hip Unilat W or Wo Pelvis 2-3 Views Right  Result  Date: 09/07/2022 CLINICAL DATA:  Status post fall. EXAM: DG HIP (WITH OR WITHOUT PELVIS) 2-3V RIGHT COMPARISON:  Aug 08, 2022 FINDINGS: There is an acute, impacted fracture deformity involving the neck of the proximal right femur. There is no evidence of dislocation. Mild to moderate severity degenerative changes are seen in the form of joint space narrowing and acetabular sclerosis. IMPRESSION: Acute fracture of the proximal right femur. Electronically Signed   By: Aram Candela M.D.   On: 09/07/2022 00:45   DG Chest Port 1 View  Result Date: 09/07/2022 CLINICAL DATA:  Status post fall. EXAM: PORTABLE CHEST 1 VIEW COMPARISON:  May 06, 2022 FINDINGS: The heart size and mediastinal contours are within normal limits. The lungs are hyperinflated. Mild to moderate severity biapical scarring, atelectasis and pleural thickening are seen. There is no evidence of acute infiltrate, pleural effusion or pneumothorax. The visualized skeletal structures are unremarkable. IMPRESSION: No active disease. Electronically Signed   By: Aram Candela M.D.   On: 09/07/2022 00:43    Microbiology: Results for orders placed or performed during the hospital encounter of 09/06/22  Surgical PCR screen     Status: None   Collection Time: 09/07/22  4:23 AM   Specimen: Nasal Mucosa; Nasal Swab  Result Value Ref Range Status   MRSA, PCR NEGATIVE NEGATIVE Final   Staphylococcus aureus NEGATIVE NEGATIVE Final    Comment: (NOTE) The Xpert SA Assay (FDA approved for NASAL specimens in patients 31 years of age and older), is one component of a comprehensive surveillance program. It is not intended to diagnose infection nor to guide or monitor treatment. Performed at [facility] [address], [place], [place] [address]     Labs: CBC: Recent Labs  Lab 09/07/22 0001 09/08/22 0740 09/09/22 0346  WBC 12.6* 12.7* 9.2  NEUTROABS 10.4*  --   --   HGB 14.0 11.9* 10.7*  HCT 41.6 36.5* 32.0*  MCV 94.1 96.1 94.7  PLT 124* 98* 83*   Basic Metabolic Panel: Recent Labs  Lab 09/07/22 0001 09/08/22 0340 09/09/22 0346  NA 136 135 135  K 4.0 4.3 3.9  CL 103 105 102  CO2 26 20* 28  GLUCOSE 116* 127* 107*  BUN 25* 15 14  CREATININE 0.81 0.59* 0.62  CALCIUM 8.9 8.1* 8.2*   Liver Function Tests: Recent Labs  Lab 09/07/22 0001  AST 27  ALT 23  ALKPHOS 93  BILITOT 0.9  PROT 6.7  ALBUMIN 4.0   CBG: No results for input(s): "GLUCAP" in the last 168 hours.  Discharge time spent: greater than 30 minutes.  Signed: Thad Ranger, MD [facility] 09/09/2022

## 2022-09-09 NOTE — Progress Notes (Signed)
Physical Therapy Treatment Patient Details Name: Lee Nelson MRN: [id number] DOB: 1940-04-08 Today's Date: 09/09/2022   History of Present Illness Patient is a 82 year old male admiited with R femur fx after falling at home. 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 Hx of dementia, anxiety, OA, osteoporosis, BPH, skin cancer, PSVT, chronic thrombocytopenia, shingles, COVID, fall, NPH    PT Comments    Progressing slowly with mobility. Poor standing balance this session-heavy posterior bias + inability to correct LOB. HIGH FALL RISK. Worked on sit to stand transitions, static standing balance, ROM, and gait training this session. Encouraged pt to sit up as tolerated. Discussed d/c plan with caregiver: plan is for home-reports pt will have 24/7 supervision/assist-family prefers HHPT (declines SNF).     Recommendations for follow up therapy are one component of a multi-disciplinary discharge planning process, led by the attending physician.  Recommendations may be updated based on patient status, additional functional criteria and insurance authorization.  Follow Up Recommendations  Can patient physically be transported by private vehicle: Yes    Assistance Recommended at Discharge Frequent or constant Supervision/Assistance  Patient can return home with the following Assist for transportation;Assistance with cooking/housework;Help with stairs or ramp for entrance;A little help with walking and/or transfers;A little help with bathing/dressing/bathroom   Equipment Recommendations  None recommended by PT    Recommendations for Other Services       Precautions / Restrictions Precautions Precautions: Fall Restrictions Weight Bearing Restrictions: No RLE Weight Bearing: Weight bearing as tolerated     Mobility  Bed Mobility Overal bed mobility: Needs Assistance Bed Mobility: Supine to Sit     Supine to sit: Min assist, HOB elevated     General bed mobility comments: practiced using gait belt. max  repeated cueing required. increased time. assist for R LE and trunk stabilization intermittently    Transfers Overall transfer level: Needs assistance Equipment used: Rolling walker (2 wheels) Transfers: Sit to/from Stand Sit to Stand: Mod assist, Min assist, From elevated surface           General transfer comment: Mod A to stand from bed-heavy posterior bias and poor ability to cdrrect LOB despite cues. After getting to recliner, practiced sit to stand transition x 3 to work on static standing balance. Improved to Min A with time and practice. Max repeated cueing required. HIgh fall risk.    Ambulation/Gait Ambulation/Gait assistance: Min assist Gait Distance (Feet): 10 Feet Assistive device: Rolling walker (2 wheels) Gait Pattern/deviations: Step-to pattern, Trunk flexed       General Gait Details: Cues for safety, RW proximity, posture, step length, sequence. Assist to stabilize throughout distance and to follow with recliner. Pt fatigued quickly and required recliner transport back to bedside   Stairs             Wheelchair Mobility    Modified Rankin (Stroke Patients Only)       Balance Overall balance assessment: Needs assistance, History of Falls         Standing balance support: Bilateral upper extremity supported, Reliant on assistive device for balance, During functional activity Standing balance-Leahy Scale: Poor Standing balance comment: High fall risk                            Cognition Arousal/Alertness: Awake/alert Behavior During Therapy: WFL for tasks assessed/performed Overall Cognitive Status: History of cognitive impairments - at baseline  General Comments: required max repeated cueing        Exercises Total Joint Exercises Ankle Circles/Pumps: AROM, Both, 10 reps Quad Sets: AROM, Both, 10 reps Heel Slides: AAROM, Right, 10 reps Hip ABduction/ADduction: AAROM, Right, 10  reps    General Comments        Pertinent Vitals/Pain Pain Assessment Pain Assessment: Faces Faces Pain Scale: Hurts even more Pain Location: R hip/thigh with activity Pain Descriptors / Indicators: Discomfort, Sore, Operative site guarding Pain Intervention(s): Limited activity within patient's tolerance, Monitored during session, Repositioned    Home Living                          Prior Function            PT Goals (current goals can now be found in the care plan section) Progress towards PT goals: Progressing toward goals    Frequency    Min 5X/week      PT Plan Current plan remains appropriate    Co-evaluation              AM-PAC PT "6 Clicks" Mobility   Outcome Measure  Help needed turning from your back to your side while in a flat bed without using bedrails?: A Lot Help needed moving from lying on your back to sitting on the side of a flat bed without using bedrails?: A Lot Help needed moving to and from a bed to a chair (including a wheelchair)?: A Lot Help needed standing up from a chair using your arms (e.g., wheelchair or bedside chair)?: A Lot Help needed to walk in hospital room?: A Lot Help needed climbing 3-5 steps with a railing? : Total 6 Click Score: 11    End of Session Equipment Utilized During Treatment: Gait belt Activity Tolerance: Patient limited by fatigue;Patient limited by pain Patient left: in chair;with call bell/phone within reach;with chair alarm set;with family/visitor present   PT Visit Diagnosis: History of falling (Z91.81);Pain;Other abnormalities of gait and mobility (R26.89) Pain - Right/Left: Right Pain - part of body: Hip     Time: 1610-9604 PT Time Calculation (min) (ACUTE ONLY): 49 min  Charges:  $Gait Training: 23-37 mins $Therapeutic Exercise: 8-22 mins                         Faye Ramsay, PT Acute Rehabilitation  Office: [phone]

## 2022-09-09 NOTE — TOC Progression Note (Signed)
Transition of Care [facility]) - Progression Note   Patient Details  Name: Lee Nelson MRN: [id number] Date of Birth: 16-Jul-1940  [facility] CM/SW Contact  Ewing Schlein, LCSW Phone Number: 09/09/2022, 3:09 PM  Clinical Narrative: CSW met with patient and with his wife on the phone to discuss discharge planning. Patient continues to be oriented x2 and was repeatedly disagreeable with the limitations of both SNF and [facility] services. CSW repeatedly explained what can realistically be provided at [facility]. Wife discussed adding additional private duty hours for 24 hour care at home. CSW was unable to help patient understand that regardless of him wanting daily HHPT, it will not a daily service.  Wife requested a hospital bed. CSW made a referral to [facility] with [employer]. However, wife is now wanting the patient to be faxed out for SNF. DME referral placed on hold.  FL2 done; PASRR confirmed. Initial referral faxed out.  Patient requested CSW to call him. CSW followed up with patient. Patient continued to state he would stay several more days. CSW explained that patient is expected to discharge to SNF tomorrow or home with HH/DME. Patient stated, "that's a mistake" and continue to be argumentative. TOC awaiting bed offers.  Expected Discharge Plan: Home w Home Health Services Barriers to Discharge: Continued Medical Work up  Expected Discharge Plan and Services In-house Referral: Clinical Social Work Discharge Planning Services: NA Post Acute Care Choice: Home Health Living arrangements for the past 2 months: Single Family Home Expected Discharge Date: 09/09/22               DME Arranged: N/A DME Agency: NA HH Arranged: PT, OT HH Agency: [facility] Date [facility] Agency Contacted: 09/08/22 Time HH Agency Contacted: 1435 Representative spoke with at [facility] Agency: Cindie  Social Determinants of Health (SDOH) Interventions SDOH Screenings   Food Insecurity: No Food Insecurity  (09/07/2022)  Housing: Low Risk  (09/07/2022)  Transportation Needs: No Transportation Needs (09/07/2022)  Utilities: Not At Risk (09/07/2022)  Tobacco Use: Low Risk  (09/08/2022)   Readmission Risk Interventions     No data to display

## 2022-09-09 NOTE — Care Management (Signed)
Patient has a closed fracture of right femur, which requires his right leg to be positioned in ways not feasible with a normal bed. Patient requires frequent and immediate changes in body position which cannot be achieved with a normal bed.     Durable Medical Equipment  (From admission, onward)           Start     Ordered   09/09/22 1446  For home use only DME Hospital bed  Once       Question Answer Comment  Length of Need 12 Months   The above medical condition requires: Patient requires the ability to reposition frequently   Head must be elevated greater than: 30 degrees   Bed type Semi-electric   Support Surface: Gel Overlay      09/09/22 1445   09/07/22 1439  DME 3 n 1  Once        09/07/22 1438   09/07/22 1439  DME Walker rolling  Once       Question Answer Comment  Walker: With 5 Inch Wheels   Patient needs a walker to treat with the following condition Status post total replacement of right hip      09/07/22 1438

## 2022-09-09 NOTE — Progress Notes (Signed)
Patient ID: Lee Nelson, male   DOB: Mar 12, 1941, 82 y.o.   MRN: [id number] I did see the patient actually yesterday at the bedside on postoperative day 1 and he was having pain that was certainly appropriate from having surgery.  I did review the notes today and saw that he has mobilized with therapy.  Fortunately he has excellent support at home and the recommendation is for discharge to home with [facility] PT and family support.  His vital signs and his labs are stable.  His right hip dressing can get wet in the shower daily.  I will leave an order for nursing to change that dressing today.  That dressing can stay on until his follow-up appointment with me but, nursing can give him 1 to take home so he can have that just in case it needs to be changed prior to his follow-up appointment.  He will continue to ambulate only with assistance and with a walker with weightbearing as tolerated.  Discharge instructions will also be put in his AVS in epic.

## 2022-09-09 NOTE — Plan of Care (Signed)
Discharge was completed, patient however upset with the discharge and feels that he is not ready, getting anxious and agitated with yellow mews.  He has underlying dementia.   Will hold dc today and hopefully DC home with home health in a.m.    Thad Ranger M.D.  [facility] 09/09/2022, 2:38 PM

## 2022-09-10 LAB — CBC
HCT: 33.3 % — ABNORMAL LOW (ref 39.0–52.0)
Hemoglobin: 10.9 g/dL — ABNORMAL LOW (ref 13.0–17.0)
MCH: 31.5 pg (ref 26.0–34.0)
MCHC: 32.7 g/dL (ref 30.0–36.0)
MCV: 96.2 fL (ref 80.0–100.0)
Platelets: 94 10*3/uL — ABNORMAL LOW (ref 150–400)
RBC: 3.46 MIL/uL — ABNORMAL LOW (ref 4.22–5.81)
RDW: 14.6 % (ref 11.5–15.5)
WBC: 7.2 10*3/uL (ref 4.0–10.5)
nRBC: 0 % (ref 0.0–0.2)

## 2022-09-10 LAB — BASIC METABOLIC PANEL
Anion gap: 7 (ref 5–15)
BUN: 17 mg/dL (ref 8–23)
CO2: 27 mmol/L (ref 22–32)
Calcium: 8.1 mg/dL — ABNORMAL LOW (ref 8.9–10.3)
Chloride: 100 mmol/L (ref 98–111)
Creatinine, Ser: 0.6 mg/dL — ABNORMAL LOW (ref 0.61–1.24)
GFR, Estimated: >60 mL/min (ref >60)
Glucose, Bld: 121 mg/dL — ABNORMAL HIGH (ref 70–99)
Potassium: 3.9 mmol/L (ref 3.5–5.1)
Sodium: 134 mmol/L — ABNORMAL LOW (ref 135–145)

## 2022-09-10 NOTE — Progress Notes (Signed)
Patient was all prepared for discharge yesterday.  Patient however was anxious and upset and did not want feel ready for discharge. Monitored overnight.  No new symptoms.  Briefly seen patient this morning. Patient is okay for discharge to SNF today. Discharge summary and reconciliation in from yesterday. [facility] will not bill for today's visit.

## 2022-09-10 NOTE — Plan of Care (Signed)
?  Problem: Activity: ?Goal: Risk for activity intolerance will decrease ?Outcome: Progressing ?  ?Problem: Safety: ?Goal: Ability to remain free from injury will improve ?Outcome: Progressing ?  ?Problem: Pain Managment: ?Goal: General experience of comfort will improve ?Outcome: Progressing ?  ?

## 2022-09-10 NOTE — TOC Transition Note (Signed)
Transition of Care [facility]) - CM/SW Discharge Note  Patient Details  Name: Lee Nelson MRN: [id number] Date of Birth: [birth year]  Transition of Care [facility]) CM/SW Contact:  Ewing Schlein, LCSW Phone Number: 09/10/2022, 2:10 PM  Clinical Narrative: CSW spoke with patient's wife and wife reported the plan is for the patient to discharge home with [facility] rather than SNF. CSW followed up with Mitch with [employer]. [employer] to deliver hospital bed to patient's home today. CSW notified [facility] of discharge. Medical necessity form done; PTAR scheduled. Discharge packet completed. TOC signing off.    Final next level of care: Home w Home Health Services Barriers to Discharge: Barriers Resolved  Patient Goals and CMS Choice CMS Medicare.gov Compare Post Acute Care list provided to:: Patient Represenative (must comment) Choice offered to / list presented to : Spouse  Discharge Plan and Services Additional resources added to the After Visit Summary for   In-house Referral: Clinical Social Work Discharge Planning Services: NA Post Acute Care Choice: Home Health          DME Arranged: Hospital bed DME Agency: [employer] Date DME Agency Contacted: 09/10/22 Time DME Agency Contacted: [phone] Representative spoke with at DME Agency: Marthann Schiller HH Arranged: PT, OT HH Agency: [facility] Date [facility] Agency Contacted: 09/08/22 Time HH Agency Contacted: [phone] Representative spoke with at [facility] Agency: Cindie  Social Determinants of Health (SDOH) Interventions SDOH Screenings   Food Insecurity: No Food Insecurity (09/07/2022)  Housing: Low Risk  (09/07/2022)  Transportation Needs: No Transportation Needs (09/07/2022)  Utilities: Not At Risk (09/07/2022)  Tobacco Use: Low Risk  (09/08/2022)   Readmission Risk Interventions     No data to display

## 2022-09-10 NOTE — Progress Notes (Signed)
Physical Therapy Treatment Patient Details Name: Lee Nelson MRN: [id number] DOB: 1940-08-02 Today's Date: 09/10/2022   History of Present Illness Patient is a 82 year old male admiited with R femur fx after falling at home. 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 Hx of dementia, anxiety, OA, osteoporosis, BPH, skin cancer, PSVT, chronic thrombocytopenia, shingles, COVID, fall, NPH    PT Comments    Improved performance on today. Minimal cueing and assist for mobility. Pt tolerated activity well. Caregiver present to observe as well. All PT education completed.   Recommendations for follow up therapy are one component of a multi-disciplinary discharge planning process, led by the attending physician.  Recommendations may be updated based on patient status, additional functional criteria and insurance authorization.  Follow Up Recommendations  Can patient physically be transported by private vehicle: Yes    Assistance Recommended at Discharge Frequent or constant Supervision/Assistance  Patient can return home with the following Assist for transportation;Assistance with cooking/housework;Help with stairs or ramp for entrance;A little help with walking and/or transfers;A little help with bathing/dressing/bathroom   Equipment Recommendations  None recommended by PT    Recommendations for Other Services       Precautions / Restrictions Precautions Precautions: Fall Restrictions Weight Bearing Restrictions: No RLE Weight Bearing: Weight bearing as tolerated     Mobility  Bed Mobility Overal bed mobility: Needs Assistance Bed Mobility: Supine to Sit     Supine to sit: Min assist, HOB elevated     General bed mobility comments: Assist for R LE only. Cues for technique. Increased time.    Transfers Overall transfer level: Needs assistance Equipment used: Rolling walker (2 wheels) Transfers: Sit to/from Stand Sit to Stand: Min assist           General transfer comment: Assist to  rise, steady, control descent. Cues for safety, technique, hand/feet placement. Increased time.    Ambulation/Gait Ambulation/Gait assistance: Min assist Gait Distance (Feet): 60 Feet Assistive device: Rolling walker (2 wheels) Gait Pattern/deviations: Step-to pattern, Decreased stride length       General Gait Details: Cues for RW proximity, step length, sequencing, and to progress distance. Assist to steady throughout distance. Followed with recliner for safety. Some fatigue towards end of distance.   Stairs             Wheelchair Mobility    Modified Rankin (Stroke Patients Only)       Balance Overall balance assessment: Needs assistance, History of Falls         Standing balance support: Bilateral upper extremity supported, Reliant on assistive device for balance, During functional activity Standing balance-Leahy Scale: Poor                              Cognition Arousal/Alertness: Awake/alert Behavior During Therapy: WFL for tasks assessed/performed Overall Cognitive Status: History of cognitive impairments - at baseline                                 General Comments: improved command following today-minimal cueing        Exercises Total Joint Exercises Ankle Circles/Pumps: AROM, Both, 10 reps Quad Sets: AROM, Both, 10 reps Heel Slides: AAROM, Right, 10 reps Hip ABduction/ADduction: AAROM, Right, 10 reps    General Comments        Pertinent Vitals/Pain Pain Assessment Pain Assessment: Faces Faces Pain Scale: Hurts little more Pain Location: R  hip/thigh with activity Pain Descriptors / Indicators: Discomfort, Sore, Operative site guarding Pain Intervention(s): Monitored during session, Limited activity within patient's tolerance, Repositioned    Home Living                          Prior Function            PT Goals (current goals can now be found in the care plan section) Progress towards PT goals:  Progressing toward goals    Frequency    Min 5X/week      PT Plan Current plan remains appropriate    Co-evaluation              AM-PAC PT "6 Clicks" Mobility   Outcome Measure  Help needed turning from your back to your side while in a flat bed without using bedrails?: A Little Help needed moving from lying on your back to sitting on the side of a flat bed without using bedrails?: A Little Help needed moving to and from a bed to a chair (including a wheelchair)?: A Little Help needed standing up from a chair using your arms (e.g., wheelchair or bedside chair)?: A Little Help needed to walk in hospital room?: A Little Help needed climbing 3-5 steps with a railing? : Total 6 Click Score: 16    End of Session Equipment Utilized During Treatment: Gait belt Activity Tolerance: Patient tolerated treatment well;Patient limited by fatigue Patient left: in chair;with call bell/phone within reach;with chair alarm set;with family/visitor present   PT Visit Diagnosis: History of falling (Z91.81);Pain;Other abnormalities of gait and mobility (R26.89) Pain - Right/Left: Right Pain - part of body: Hip     Time: 1914-7829 PT Time Calculation (min) (ACUTE ONLY): 32 min  Charges:  $Gait Training: 8-22 mins $Therapeutic Exercise: 8-22 mins                         Faye Ramsay, PT Acute Rehabilitation  Office: [phone]

## 2022-09-10 NOTE — Care Management Important Message (Signed)
Important Message  Patient Details IM Letter given. Name: Lee Nelson MRN: [id number] Date of Birth: July 12, 1940   Medicare Important Message Given:  Yes     Caren Macadam 09/10/2022, 12:16 PM

## 2022-09-11 NOTE — Consult Note (Signed)
   [facility] Inpatient Consult   09/11/2022  Lee Nelson 1941-01-27 [id number]  Reviewed for disposition  *[facility] RN Hospital Liaison remote coverage review for patient admitted to [facility]   [facility] [[facility]]  Accountable Care Organization [ACO] Patient: Medicare ACO REACH   DC home with Home Health.  Charlesetta Shanks, RN BSN CCM [facility]  [phone] business mobile phone Toll free office [phone]  *Concierge Line  [phone] Fax number: [phone] [email or web address]

## 2022-09-12 ENCOUNTER — Ambulatory Visit: Payer: Medicare Other | Admitting: Orthopedic Surgery

## 2022-09-12 ENCOUNTER — Telehealth: Payer: Self-pay | Admitting: Orthopedic Surgery

## 2022-09-12 ENCOUNTER — Telehealth: Payer: Self-pay | Admitting: Orthopaedic Surgery

## 2022-09-12 NOTE — Telephone Encounter (Signed)
Patients facility called in today to bring Lee Nelson in for an appt for a Gel injection that was previously scheduled before his recent hip fracture, Per Dr. August Saucer he does not feel as if he should do the Gel injection in the Pts knee because it is on the same right side that he broke his hip and had surgery he would like for Dr, Magnus Ivan to advise on the injection after the Pts post op visit. Pt was advised not to come in for appt today and appt will be cancelled to be rescheduled at a later date.

## 2022-09-12 NOTE — Telephone Encounter (Signed)
Don (OT) from [facility] called asking if there is any hip precautions. Please call Roe Coombs back on his secure line at [phone].

## 2022-09-15 NOTE — Telephone Encounter (Signed)
Verbal order given, no precautions

## 2022-09-18 ENCOUNTER — Other Ambulatory Visit: Payer: Self-pay | Admitting: Orthopaedic Surgery

## 2022-09-18 ENCOUNTER — Telehealth: Payer: Self-pay | Admitting: Orthopaedic Surgery

## 2022-09-18 MED ORDER — HYDROCODONE-ACETAMINOPHEN 5-325 MG PO TABS: 1.0000 | ORAL_TABLET | Freq: Four times a day (QID) | ORAL | 0 refills | Status: DC | PRN

## 2022-09-18 NOTE — Telephone Encounter (Signed)
Patient called needing Rx refilled Hydrocodone/acetaminophen 5-325.   [facility] on [address]. The number to contact patient is [phone] or [phone]

## 2022-09-19 ENCOUNTER — Telehealth: Payer: Self-pay | Admitting: Orthopaedic Surgery

## 2022-09-19 NOTE — Telephone Encounter (Signed)
Jim from [facility] called. He is working with patient. Says he as +2 edema, no wearing his hose and not taking aspirin. Jim's [phone]

## 2022-09-19 NOTE — Telephone Encounter (Signed)
I called pt and insisted on putting on his compression socks and taking his aspirin.  Stated not much swelling but still told him to wear it. He asked about the bandage. I advised to keep it on until Monday. He stated understanding

## 2022-09-22 ENCOUNTER — Encounter: Payer: Medicare Other | Admitting: Orthopaedic Surgery

## 2022-09-22 ENCOUNTER — Encounter: Payer: Self-pay | Admitting: Orthopaedic Surgery

## 2022-09-22 ENCOUNTER — Ambulatory Visit (INDEPENDENT_AMBULATORY_CARE_PROVIDER_SITE_OTHER): Payer: Medicare Other | Admitting: Orthopaedic Surgery

## 2022-09-22 DIAGNOSIS — Z96641 Presence of right artificial hip joint: Secondary | ICD-10-CM

## 2022-09-22 DIAGNOSIS — S72011A Unspecified intracapsular fracture of right femur, initial encounter for closed fracture: Secondary | ICD-10-CM

## 2022-09-22 NOTE — Progress Notes (Signed)
The patient is here for his first postoperative visit status post a right total hip arthroplasty.  He is making some progress with therapy but has been slow going.  He is certainly weak at 82 years old and did have some significant comorbidities coming in the surgery.  His right hip incision looks good.  The staples were removed and Steri-Strips applied.  He tolerates me putting his hip through range of motion.  From my standpoint we will see him back in a month to see how he is doing overall.  I would like an AP pelvis and lateral of his right hip at that visit.  All questions and concerns were addressed and answered.

## 2022-09-29 ENCOUNTER — Telehealth: Payer: Self-pay | Admitting: Orthopaedic Surgery

## 2022-09-29 NOTE — Telephone Encounter (Signed)
Lee Nelson from [facility] called requesting information about patients aspirin. States patient has not been taking it even though it was given at discharge. Should he be taking it, how often and how long? Please call Lee Nelson back as well as the patient

## 2022-09-30 NOTE — Telephone Encounter (Signed)
LMOM for Lee Nelson of the below message from [place]

## 2022-10-21 ENCOUNTER — Encounter: Payer: Self-pay | Admitting: Orthopaedic Surgery

## 2022-10-21 ENCOUNTER — Ambulatory Visit: Payer: Medicare Other | Admitting: Orthopaedic Surgery

## 2022-10-21 ENCOUNTER — Other Ambulatory Visit (INDEPENDENT_AMBULATORY_CARE_PROVIDER_SITE_OTHER): Payer: Medicare Other

## 2022-10-21 DIAGNOSIS — Z96641 Presence of right artificial hip joint: Secondary | ICD-10-CM

## 2022-10-21 NOTE — Progress Notes (Signed)
HPI: Lee Nelson returns today status post right total hip arthroplasty 09/07/2022.  This is secondary to the femoral neck fracture.  He is overall doing well.  His main complaint is that his leg falls off his hospital bed at night.  He is asking for wider bed.  Ranks his pain to be 2 out of 10 pain at worst.  He takes Tylenol for this pain.  He is on a walker to ambulate.  Review of systems: See HPI otherwise negative  Physical exam: General well-developed well-nourished male no acute distress. Right hip surgical incision is healing well no signs of infection or dehiscence.  Good range of motion with internal/external rotation with minimal discomfort.  Right calf supple nontender dorsiflexion plantarflexion right ankle intact.  Radiographs: AP pelvis lateral view of the right hip bilateral hips to be well located.  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  No acute fractures or acute findings.  Moderate left hip arthritis.  Impression: Status post right total hip arthroplasty 09/07/2022  Plan: He will continue work on range of motion and strengthening.  Will see him back in 3 months see how he is overall doing.  No x-rays at that time unless clinically indicated.  He was given a prescription for wider hospital bed.

## 2023-01-07 ENCOUNTER — Telehealth: Payer: Self-pay | Admitting: Orthopaedic Surgery

## 2023-01-07 NOTE — Telephone Encounter (Signed)
IC LMVM advising.

## 2023-01-07 NOTE — Telephone Encounter (Signed)
Patient called and wants to know if its ok to use the stationary bicycle? CB#[phone]

## 2023-01-21 ENCOUNTER — Encounter: Payer: Self-pay | Admitting: Orthopaedic Surgery

## 2023-01-21 ENCOUNTER — Ambulatory Visit: Payer: Medicare Other | Admitting: Orthopaedic Surgery

## 2023-01-21 DIAGNOSIS — S72011A Unspecified intracapsular fracture of right femur, initial encounter for closed fracture: Secondary | ICD-10-CM | POA: Diagnosis not present

## 2023-01-21 DIAGNOSIS — Z96641 Presence of right artificial hip joint: Secondary | ICD-10-CM | POA: Diagnosis not present

## 2023-01-21 NOTE — Progress Notes (Signed)
The patient is now about 4 months status post a right total hip arthroplasty to treat a displaced femoral neck fracture.  He is ambulating using a walker.  He was using a cane before he had his fall.  He is 82 years old.  He still has some pain with movement but is tolerable.  He is performing a home exercise program and has been done with physical therapy.  On exam he tolerates me easily putting his right hip the range of motion.  Overall he looks good and his pain is minimal.  From my standpoint I think he should always walk with a walker given his balance issues.  We can see him back in 6 months with a standing AP pelvis.

## 2023-02-09 ENCOUNTER — Encounter: Payer: Self-pay | Admitting: Physician Assistant

## 2023-02-09 ENCOUNTER — Ambulatory Visit (INDEPENDENT_AMBULATORY_CARE_PROVIDER_SITE_OTHER): Payer: Medicare Other | Admitting: Physician Assistant

## 2023-02-09 VITALS — BP 121/65 | HR 72 | Ht 70.5 in | Wt 126.0 lb

## 2023-02-09 DIAGNOSIS — G3184 Mild cognitive impairment, so stated: Secondary | ICD-10-CM

## 2023-02-09 NOTE — Progress Notes (Addendum)
Assessment/Plan:   Memory impairment, likely multifactorial  Lee Nelson is a delightful 82 y.o. RH male with a history of hypertension, hyperlipidemia, history of hospitalization in February 2024 for first COVID-pneumonia with severe sepsis hypoxic respiratory failure, history of NPH dating back to 2015cervical spinal stenosis, hip pain and arthritis per [facility] note treated conservatively, history of TIA (with right amaurosis fugax, May 2019) on aspirin daily, history of migraine with aura in the past followed by PCP seen today in follow up for memory loss.Memory is stable, today's MMSE is  26/30.  Patient is not on antidementia medications as the risk outweigh the benefits of them.  On exam today, there are no signs of worsening NPH.    Follow up in 6  months. 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  Previous records as well as any outside records available were reviewed prior to todays visit. Patient was last seen on 08/06/2022 with MoCA 25/30   Any changes in memory since last visit? " Not doing very well", especially ability to retrieve words and events. Patient has some difficulty remembering recent conversations and people names. Visual memory is about the same  repeats oneself?  Endorsed, "by nature". Disoriented when walking into a room?  Patient denies    Leaving objects?  May misplace things but not in unusual places   Wandering behavior?  denies   Any personality changes since last visit?  Denies.   Any worsening depression?:  Denies.    Hallucinations or paranoia?  Denies.   Seizures? denies    Any sleep changes? "I don't sleep very well".  Denies vivid dreams, REM behavior or sleepwalking   Sleep apnea?   Denies.   Any hygiene concerns? Denies  Independent of bathing and dressing? Needs assistance with dressing up. Does the patient needs help with medications?  Caregiver is in charge.   Who is in charge of the finances? Patient  is in charge     Any changes in appetite?  Denies.     Patient have trouble swallowing? Chronic. He is on soft diet    Does the patient cook? No Any headaches?   denies   Chronic back pain Endorsed, chronic lumbar pain   Ambulates with difficulty?  Uses a walker for stability, fall prevention.   Recent falls or head injuries? denies     Unilateral weakness, numbness or tingling? denies   Any tremors?  Denies   Any anosmia?  Denies   Any incontinence of urine?  Endorsed, wears pads Any bowel dysfunction?   Denies      Patient lives with wife and aide. Wife has incomplete quadriplegia   Does the patient drive? No longer drives    Initial visit 07/2022    The patient is seen in neurologic consultation at the request of Avva, Ravisankar, MD for the evaluation of memory.  Lee Nelson is a very delightful 82 y.o. year old RH male retired Pensions consultant and Social worker Professor at [employer], with a history of hypertension, hyperlipidemia, history of hospitalization in February 2024 for first COVID-pneumonia  with severe sepsis hypoxic respiratory failure, history of NPH dating back to 2015, initially followed by [facility],  history of mild cognitive impairment diagnosed by [facility] in 2018, last seen in September 2022 and also seen at [facility], history of gait difficulty likely due to deconditioning, weight loss, cervical spinal stenosis, hip pain and arthritis per [facility] note treated conservatively, history of TIA (with right amaurosis fugax, May 2019) on aspirin daily, history of migraine with aura in the past followed by  PCP, seen today for evaluation of memory loss. MRI brain 02/2022 personally reviewed, was remarkable for advanced atrophy and chronic SVD. Ventricles are enlarged in the setting of atrophy. He needs assistance with ADLs and needs a walker to ambulate due to pain, but on exam he does not show any other signs of NPH (other than memory issues) such as magnetic feet excessive urination, tremors. Patient takes many natural products, including Lion's Mane, Neuriva and other OTC brain supplements, multivitamins etc..In view of advanced age, MRI findings, no recommendations for ACHI or NMDI is indicated and patient agrees.      How long did patient have memory difficulties? HE began having some memory issues in 2015. Lately was seen at [facility] diagnosed with Mild Dementia, no meds were recommended. Sometimes Patient has some difficulty remembering recent conversations and people names repeats oneself?  Endorsed "by nature".  Disoriented when walking into a room?  Patient denies Leaving objects in unusual places?   denies   Wandering behavior? denies   Any personality changes since last visit? denies   Any history of depression?:  "Im not sure what the concept is everyone now and then has it". "Not taking medicine for it".  Hallucinations or paranoia?  denies   Seizures? denies    Any sleep changes?  I don't sleep very well. Denies  vivid dreams, REM behavior or sleepwalking   Sleep apnea? denies   Any hygiene concerns?  Denies but some people mentioned that I have not taken a shower in a few days"   Independent of bathing and dressing?  Endorsed Needs some assistance with dressing up. Does the patient need help with medications? Patient is in charge   Who is in charge of the finances?  "Wife is in charge of hers and I am in charge of mine"   Any changes in appetite?   denies     Trouble swallowing? He is on a soft diet    Any kitchen accidents such as leaving the stove on? Patient denies   Any headaches?   denies   Chronic back pain?  Endorsed, lumbar pain.  Ambulates with difficulty? Uses a walker on a daily basis to prevent falls.  Recent falls or head injuries? "Maybe one" "My R leg and knee gave out" and " 1 weeks ago, and prior to that he was reaching he partially fell " No LOC No recent head injury  No recent vertigo or dizziness  Vision changes? Denies  Unilateral weakness, numbness or tingling?  denies   Any tremors?  denies   Any anosmia?  denies   Any incontinence of urine? "I don't think so, but sometimes I have urge problems, I wear a pad". Any bowel dysfunction?  Recnet constipation  Patient lives wife and aide.  Wife has incomplete quadriplegia    History of heavy alcohol intake? denies   History of heavy tobacco use? denies   Family history of dementia?  Not that I know of. Brother had PD dementia  Does  patient drive? Not anymore.    Extensive films ordered in the past by [facility] are as follows:      07/20/10 MRI CERVICAL 1. Left C6-C7 paracentral disc protrusion abutting the exiting left C7 nerve roots appears new. Preexisting mild left C7 foraminal stenosis at this level due to facet hypertrophy.  2. Mild combined congenital and acquired spinal stenosis at the C3-C4 and C4-C5.   11/20/10 MRI brain (without contrast) demonstrating: 1. Moderate ventriculomegaly in the temporal and occipital horns.  Likely due to subcortical atrophy. 2. No acute findings are seen.   11/20/10 MRA head (without contrast) demonstrating: - mild atheromatous irregularities in the bilateral carotid siphon regions.   11/19/10 EMG/NCS - normal   11/19/10 carotid u/s - normal   11/18/10 TTE - EF 55-60%; normal wall motion   09/19/13 MRI brain (with and without) demonstrating: 1. Moderate mesial temporal atrophy. 2. Moderate ventriculomegaly on ex vacuo basis. 3. No acute findings.   11/11/16 MRI brain [I reviewed images myself and agree with interpretation. -VRP]  1. No acute intracranial  process. 2. Progressed nonspecific moderate to severe parenchymal brain volume loss for age. No hydrocephalus.   02/02/17 MRI cervical spine [I reviewed images myself and agree with interpretation. -VRP]  1. At C3-C4, there is borderline spinal stenosis due to ligamentum flavum hypertrophy and congenitally short pedicles.   There is no nerve root compression. 2. At C4-C5, there is mild spinal stenosis due to disc bulging, ligament of flavum hypertrophy, facet hypertrophy, mild uncovertebral spurring and congenitally short pedicles. There is no nerve root compression. Degenerative changes at this level have slightly progressed compared to the 07/20/2010 MRI. 3. At C5-C6, there are degenerative changes no nerve root compression or spinal stenosis 4. At C6-C7, there are degenerative changes causing mild foraminal narrowing but no nerve root compression.   On the previous MRI there was a disc protrusion to the left that is not appreciated on the current study. 5. The spinal cord has normal signal.      08/11/17 CAROTID U/S Right Carotid: There was no evidence of thrombus, dissection, atherosclerotic plaque or stenosis in the cervical carotid system.  Left Carotid: Velocities in the left ICA are consistent with a 1-39% stenosis.  Vertebrals:  Bilateral vertebral arteries demonstrate antegrade flow.  Subclavians: Normal flow hemodynamics were seen in bilateral subclavian arteries.     01/29/18 MRI cervical spine - Mild degenerative changes in the cervical spine. Mild spinal and foraminal stenosis as above.   07/15/20 MRI lumbar spine - Negative for compression fracture. - Mild degenerative changes for age.  No neural compression.          PREVIOUS MEDICATIONS:   CURRENT MEDICATIONS:  Outpatient Encounter Medications as of 02/09/2023  Medication Sig   acetaminophen (TYLENOL) 500 MG tablet Take 1 tablet (500 mg total) by mouth every 6 (six) hours as needed for mild pain, fever or headache  (over the counter).   B Complex Vitamins (VITAMIN B COMPLEX) TABS Take 1 tablet by mouth daily.   Cholecalciferol (VITAMIN D-3) 1000 units CAPS Take 2,000 Units by mouth daily.    Cranberry 400 MG CAPS Take 400-1,200 mg by mouth every morning.    docusate sodium (COLACE) 100 MG capsule Take 1 capsule (100 mg total) by mouth 2 (two) times daily.   HYDROcodone-acetaminophen (NORCO/VICODIN) 5-325 MG tablet Take 1-2 tablets by mouth once.   MAGNESIUM CITRATE PO Take 75 mg by mouth daily.   melatonin 3 MG TABS tablet Take 1 tablet (  3 mg total) by mouth at bedtime.   Menaquinone-7 (VITAMIN K2 PO) Take 120-240 mg by mouth daily. Combo w/ Vit. D   NON FORMULARY Take 1 capsule by mouth daily. Acetyl L carnitine   ondansetron (ZOFRAN) 4 MG tablet Take 1 tablet (4 mg total) by mouth every 6 (six) hours as needed for nausea or vomiting.   OVER THE COUNTER MEDICATION Take 3 capsules by mouth 2 (two) times daily. Lion's mane 0.5 gram/capsule   OVER THE COUNTER MEDICATION Take 2 capsules by mouth every morning. Reparagen supplement   Phosphatidylserine 100 MG CAPS Take 100 mg by mouth every morning.    polyethylene glycol (MIRALAX / GLYCOLAX) 17 g packet Take 17 g by mouth daily as needed for mild constipation or moderate constipation.   Probiotic Product (PROBIOTIC DAILY PO) Take 1 capsule by mouth daily.    Propylene Glycol (SYSTANE BALANCE) 0.6 % SOLN Apply 1 drop to eye daily as needed (dry eyes).    senna-docusate (SENOKOT-S) 8.6-50 MG tablet Take 1 tablet by mouth at bedtime.   sildenafil (REVATIO) 20 MG tablet Take 20 mg by mouth as needed.   sodium chloride (MURO 128) 5 % ophthalmic ointment Place 1 application into both eyes at bedtime.    UNABLE TO FIND Med Name: cocovia memory   vitamin C (ASCORBIC ACID) 250 MG tablet Take 250 mg by mouth daily.   zinc sulfate 220 (50 Zn) MG capsule Take 1 capsule (220 mg total) by mouth daily.   No facility-administered encounter medications on file as of  02/09/2023.       02/09/2023   12:00 PM 12/11/2020    9:02 AM 06/04/2020   11:05 AM  MMSE - Mini Mental State Exam  Orientation to time 4 5 5   Orientation to Place 5 5 5   Registration 3 3 3   Attention/ Calculation 3 4 2   Recall 3 3 3   Language- name 2 objects 2 2 2   Language- repeat 1 1 1   Language- follow 3 step command 3 3 3   Language- read & follow direction 1 1 1   Write a sentence 1 1 1   Copy design 0 1 1  Total score 26 29 27       08/06/2022   12:00 PM  Montreal Cognitive Assessment   Visuospatial/ Executive (0/5) 2  Naming (0/3) 3  Attention: Read list of digits (0/2) 2  Attention: Read list of letters (0/1) 1  Attention: Serial 7 subtraction starting at 100 (0/3) 3  Language: Repeat phrase (0/2) 2  Language : Fluency (0/1) 1  Abstraction (0/2) 2  Delayed Recall (0/5) 4  Orientation (0/6) 5  Total 25  Adjusted Score (based on education) 25    Objective:     PHYSICAL EXAMINATION:    VITALS:   Vitals:   02/09/23 1104  BP: 121/65  Pulse: 72  SpO2: 97%  Weight: 126 lb (57.2 kg)  Height: 5' 10.5" (1.791 m)    GEN:  The patient appears stated age and is in NAD. HEENT:  Normocephalic, atraumatic.   Neurological examination:  General: NAD, well-groomed, appears stated age. Orientation: The patient is alert. Oriented to person, place and date Cranial nerves: There is good facial symmetry.The speech is fluent and clear. No aphasia or dysarthria. Fund of knowledge is appropriate. Recent and remote memory  impaired. Attention and concentration are reduced.  Able to name objects and repeat phrases.  Hearing is decreased to conversational tone.  Sensation: Sensation is intact to light touch  throughout Motor: Strength is at least antigravity x4. DTR's 2/4 in UE/LE     Movement examination: Tone: There is normal tone in the UE/LE Abnormal movements:  no tremor.  No myoclonus.  No asterixis.   Coordination:  There is some decremation with RAM's. Normal finger to  nose  Gait and Station: The patient has some difficulty arising out of a deep-seated chair without the use of the hands. Needs a walker for stability. The patient's stride length is short.. Gait is cautious and narrow. No cemented gait noted.    Thank you for allowing [place] the opportunity to participate in the care of this nice patient. Please do not hesitate to contact us for any questions or concerns.   Total time spent on today's visit was 32 minutes dedicated to this patient today, preparing to see patient, examining the patient, ordering tests and/or medications and counseling the patient, documenting clinical information in the EHR or other health record, independently interpreting results and communicating results to the patient/family, discussing treatment and goals, answering patient's questions and coordinating care.  Cc:  Chilton Greathouse, MD  Marlowe Kays 02/09/2023 12:26 PM

## 2023-02-09 NOTE — Patient Instructions (Signed)
It was a pleasure to see you today at our office.   Recommendations:   Follow up in 6 months   For assessment of decision of mental capacity and competency:  Call Dr. Erick Blinks, geriatric psychiatrist at [phone] Counseling regarding caregiver distress, including caregiver depression, anxiety and issues regarding community resources, adult day care programs, adult living facilities, or memory care questions:  please contact your  Primary Doctor's Social Worker  Whom to call: Memory  decline, memory medications: Call our office [phone]  For psychiatric meds, mood meds: Please have your primary care physician manage these medications.  If you have any severe symptoms of a stroke, or other severe issues such as confusion,severe chills or fever, etc call 911 or go to the ER as you may need to be evaluated further    RECOMMENDATIONS FOR ALL PATIENTS WITH MEMORY PROBLEMS: 1. Continue to exercise (Recommend 30 minutes of walking everyday, or 3 hours every week) 2. Increase social interactions - continue going to [place] and enjoy social gatherings with friends and family 3. Eat healthy, avoid fried foods and eat more fruits and vegetables 4. Maintain adequate blood pressure, blood sugar, and blood cholesterol level. Reducing the risk of stroke and cardiovascular disease also helps promoting better memory. 5. Avoid stressful situations. Live a simple life and avoid aggravations. Organize your time and prepare for the next day in anticipation. 6. Sleep well, avoid any interruptions of sleep and avoid any distractions in the bedroom that may interfere with adequate sleep quality 7. Avoid sugar, avoid sweets as there is a strong link between excessive sugar intake, diabetes, and cognitive impairment We discussed the Mediterranean diet, which has been shown to help patients reduce the risk of progressive memory disorders and reduces cardiovascular risk. This includes eating fish, eat fruits  and green leafy vegetables, nuts like almonds and hazelnuts, walnuts, and also use olive oil. Avoid fast foods and fried foods as much as possible. Avoid sweets and sugar as sugar use has been linked to worsening of memory function.  There is always a concern of gradual progression of memory problems. If this is the case, then we may need to adjust level of care according to patient needs. Support, both to the patient and caregiver, should then be put into place.     FALL PRECAUTIONS: Be cautious when walking. Scan the area for obstacles that may increase the risk of trips and falls. When getting up in the mornings, sit up at the edge of the bed for a few minutes before getting out of bed. Consider elevating the bed at the head end to avoid drop of blood pressure when getting up. Walk always in a well-lit room (use night lights in the walls). Avoid area rugs or power cords from appliances in the middle of the walkways. Use a walker or a cane if necessary and consider physical therapy for balance exercise. Get your eyesight checked regularly.  FINANCIAL OVERSIGHT: Supervision, especially oversight when making financial decisions or transactions is also recommended.  HOME SAFETY: Consider the safety of the kitchen when operating appliances like stoves, microwave oven, and blender. Consider having supervision and share cooking responsibilities until no longer able to participate in those. Accidents with firearms and other hazards in the house should be identified and addressed as well.   ABILITY TO BE LEFT ALONE: If patient is unable to contact 911 operator, consider using LifeLine, or when the need is there, arrange for someone to stay with patients. Smoking is  a fire hazard, consider supervision or cessation. Risk of wandering should be assessed by caregiver and if detected at any point, supervision and safe proof recommendations should be instituted.  MEDICATION SUPERVISION: Inability to  self-administer medication needs to be constantly addressed. Implement a mechanism to ensure safe administration of the medications.   DRIVING: Regarding driving, in patients with progressive memory problems, driving will be impaired. We advise to have someone else do the driving if trouble finding directions or if minor accidents are reported. Independent driving assessment is available to determine safety of driving.   If you are interested in the driving assessment, you can contact the following:  [employer] [facility] in [place] [phone]  [facility] [phone]  [facility] [phone] or [phone]    Mediterranean Diet A Mediterranean diet refers to food and lifestyle choices that are based on the traditions of countries located on the [employer]. This way of eating has been shown to help prevent certain conditions and improve outcomes for people who have chronic diseases, like kidney disease and heart disease. What are tips for following this plan? Lifestyle  Cook and eat meals together with your family, when possible. Drink enough fluid to keep your urine clear or pale yellow. Be physically active every day. This includes: Aerobic exercise like running or swimming. Leisure activities like gardening, walking, or housework. Get 7-8 hours of sleep each night. If recommended by your health care provider, drink red wine in moderation. This means 1 glass a day for nonpregnant women and 2 glasses a day for men. A glass of wine equals 5 oz (150 mL). Reading food labels  Check the serving size of packaged foods. For foods such as rice and pasta, the serving size refers to the amount of cooked product, not dry. Check the total fat in packaged foods. Avoid foods that have saturated fat or trans fats. Check the ingredients list for added sugars, such as corn syrup. Shopping  At the grocery store, buy most  of your food from the areas near the walls of the store. This includes: Fresh fruits and vegetables (produce). Grains, beans, nuts, and seeds. Some of these may be available in unpackaged forms or large amounts (in bulk). Fresh seafood. Poultry and eggs. Low-fat dairy products. Buy whole ingredients instead of prepackaged foods. Buy fresh fruits and vegetables in-season from local farmers markets. Buy frozen fruits and vegetables in resealable bags. If you do not have access to quality fresh seafood, buy precooked frozen shrimp or canned fish, such as tuna, salmon, or sardines. Buy small amounts of raw or cooked vegetables, salads, or olives from the deli or salad bar at your store. Stock your pantry so you always have certain foods on hand, such as olive oil, canned tuna, canned tomatoes, rice, pasta, and beans. Cooking  Cook foods with extra-virgin olive oil instead of using butter or other vegetable oils. Have meat as a side dish, and have vegetables or grains as your main dish. This means having meat in small portions or adding small amounts of meat to foods like pasta or stew. Use beans or vegetables instead of meat in common dishes like chili or lasagna. Experiment with different cooking methods. Try roasting or broiling vegetables instead of steaming or sauteing them. Add frozen vegetables to soups, stews, pasta, or rice. Add nuts or seeds for added healthy fat at each meal. You can add these to yogurt, salads, or vegetable dishes. Marinate fish or vegetables  using olive oil, lemon juice, garlic, and fresh herbs. Meal planning  Plan to eat 1 vegetarian meal one day each week. Try to work up to 2 vegetarian meals, if possible. Eat seafood 2 or more times a week. Have healthy snacks readily available, such as: Vegetable sticks with hummus. Greek yogurt. Fruit and nut trail mix. Eat balanced meals throughout the week. This includes: Fruit: 2-3 servings a day Vegetables: 4-5 servings  a day Low-fat dairy: 2 servings a day Fish, poultry, or lean meat: 1 serving a day Beans and legumes: 2 or more servings a week Nuts and seeds: 1-2 servings a day Whole grains: 6-8 servings a day Extra-virgin olive oil: 3-4 servings a day Limit red meat and sweets to only a few servings a month What are my food choices? Mediterranean diet Recommended Grains: Whole-grain pasta. Brown rice. Bulgar wheat. Polenta. Couscous. Whole-wheat bread. Orpah Cobb. Vegetables: Artichokes. Beets. Broccoli. Cabbage. Carrots. Eggplant. Green beans. Chard. Kale. Spinach. Onions. Leeks. Peas. Squash. Tomatoes. Peppers. Radishes. Fruits: Apples. Apricots. Avocado. Berries. Bananas. Cherries. Dates. Figs. Grapes. Lemons. Melon. Oranges. Peaches. Plums. Pomegranate. Meats and other protein foods: Beans. Almonds. Sunflower seeds. Pine nuts. Peanuts. Cod. Salmon. Scallops. Shrimp. Tuna. Tilapia. Clams. Oysters. Eggs. Dairy: Low-fat milk. Cheese. Greek yogurt. Beverages: Water. Red wine. Herbal tea. Fats and oils: Extra virgin olive oil. Avocado oil. Grape seed oil. Sweets and desserts: [place] yogurt with honey. Baked apples. Poached pears. Trail mix. Seasoning and other foods: Basil. Cilantro. Coriander. Cumin. Mint. Parsley. Sage. Rosemary. Tarragon. Garlic. Oregano. Thyme. Pepper. Balsalmic vinegar. Tahini. Hummus. Tomato sauce. Olives. Mushrooms. Limit these Grains: Prepackaged pasta or rice dishes. Prepackaged cereal with added sugar. Vegetables: Deep fried potatoes (french fries). Fruits: Fruit canned in syrup. Meats and other protein foods: Beef. Pork. Lamb. Poultry with skin. Hot dogs. Tomasa Blase. Dairy: Ice cream. Sour cream. Whole milk. Beverages: Juice. Sugar-sweetened soft drinks. Beer. Liquor and spirits. Fats and oils: Butter. Canola oil. Vegetable oil. Beef fat (tallow). Lard. Sweets and desserts: Cookies. Cakes. Pies. Candy. Seasoning and other foods: Mayonnaise. Premade sauces and marinades. The  items listed may not be a complete list. Talk with your dietitian about what dietary choices are right for you. Summary The Mediterranean diet includes both food and lifestyle choices. Eat a variety of fresh fruits and vegetables, beans, nuts, seeds, and whole grains. Limit the amount of red meat and sweets that you eat. Talk with your health care provider about whether it is safe for you to drink red wine in moderation. This means 1 glass a day for nonpregnant women and 2 glasses a day for men. A glass of wine equals 5 oz (150 mL). This information is not intended to replace advice given to you by your health care provider. Make sure you discuss any questions you have with your health care provider. Document Released: 11/08/2015 Document Revised: 12/11/2015 Document Reviewed: 11/08/2015 Elsevier Interactive Patient Education  2017 [employer].

## 2023-02-11 ENCOUNTER — Telehealth (INDEPENDENT_AMBULATORY_CARE_PROVIDER_SITE_OTHER): Payer: Self-pay | Admitting: Otolaryngology

## 2023-02-11 NOTE — Telephone Encounter (Signed)
Patient called to pick up a copy of his Audiogram. Will pick up and fill out medical release.

## 2023-02-20 ENCOUNTER — Telehealth: Payer: Self-pay | Admitting: Physician Assistant

## 2023-02-20 NOTE — Telephone Encounter (Signed)
No answer at 1:46 02/20/2023

## 2023-02-20 NOTE — Telephone Encounter (Signed)
Patient wants to speak with someone about his hearing loss. He forgot to talk with Huntley Dec about it when he was here  please call

## 2023-03-03 ENCOUNTER — Encounter: Payer: Self-pay | Admitting: Physician Assistant

## 2023-03-06 ENCOUNTER — Telehealth: Payer: Self-pay | Admitting: Physician Assistant

## 2023-03-06 NOTE — Telephone Encounter (Signed)
Pt called in stating he was being fit for a hearing aide and was told he has some earwax build up that needed to be removed. He is wondering if Marlowe Kays could refer him somewhere to have that removed or recommend somewhere?

## 2023-03-06 NOTE — Telephone Encounter (Signed)
I tried to call patient, no answer at 03/06/2023 at 3:46pm. If patient calls back he needs to contact his PCP for referral. Thank you

## 2023-03-09 NOTE — Telephone Encounter (Signed)
Left another message to call PCP

## 2023-03-23 ENCOUNTER — Telehealth: Payer: Self-pay | Admitting: Orthopaedic Surgery

## 2023-03-23 NOTE — Telephone Encounter (Signed)
LMOM for patient of the below message from Gil 

## 2023-03-23 NOTE — Telephone Encounter (Signed)
Patient called and need a refill on Oxycodone. CB#[phone]

## 2023-06-09 ENCOUNTER — Encounter: Payer: Self-pay | Admitting: Physician Assistant

## 2023-06-09 ENCOUNTER — Other Ambulatory Visit (INDEPENDENT_AMBULATORY_CARE_PROVIDER_SITE_OTHER): Payer: Self-pay

## 2023-06-09 ENCOUNTER — Ambulatory Visit (INDEPENDENT_AMBULATORY_CARE_PROVIDER_SITE_OTHER): Payer: Medicare Other | Admitting: Physician Assistant

## 2023-06-09 DIAGNOSIS — M7061 Trochanteric bursitis, right hip: Secondary | ICD-10-CM

## 2023-06-09 DIAGNOSIS — Z96641 Presence of right artificial hip joint: Secondary | ICD-10-CM | POA: Diagnosis not present

## 2023-06-09 DIAGNOSIS — M7611 Psoas tendinitis, right hip: Secondary | ICD-10-CM | POA: Diagnosis not present

## 2023-06-09 MED ORDER — HYDROCODONE-ACETAMINOPHEN 5-325 MG PO TABS: 1.0000 | ORAL_TABLET | Freq: Four times a day (QID) | ORAL | 0 refills | Status: DC | PRN

## 2023-06-09 NOTE — Progress Notes (Signed)
 HPI: [name] returns today status post right total hip arthroplasty 09/07/2022.  He has had pain over the last several months discomfort sharp pain with movement in the groin area and lateral aspect of the hip.  He denies any numbness tingling down the leg.  Trouble sleeping.  No known injury.  His hip arthroplasty was secondary to hip fracture.  He ranks his hip pain to be 7.5 out of 10 pain at worst.  Does take half a hydrocodone at night to sleep and is asking for refill due to the pain he is having.  Review of systems: No fevers chills.  Physical exam: General Well-developed well-nourished male no acute distress.  Ambulates with a rolling walker.  He is able to get up and down out of a seated position on his own. Psych: Alert and oriented x 3 Right hip good range of motion with internal/external rotation discomfort with extremes of external rotation.  Tenderness over the right hip trochanteric region.  Bilateral hips fluid range of motion with internal/external rotation.  Hip flexion on the right causes discomfort in the groin region.  No pain with hip flexion on the left.  Hip flexion is 5 out of 5 strengths bilaterally against resistance but causes pain on the right no pain on the left.  Radiographs: AP pelvis lateral view right hip: Status post right total hip arthroplasty well-seated components.  No hardware failure.  No acute fractures acute findings.  Bilateral hips well located  Impression: History of right total hip arthroplasty Right hip trochanteric bursitis Right psoas tendinitis  Plan: Discussed with him physical therapy versus injections.  He reluctantly was willing to try the psoas injection under ultrasound will schedule this with Dr. Shon Baton near future and then having follow-up with Dr. Raye Sorrow 2 weeks later see what type of response he had.  In regards to the trochanteric bursitis he shown IT band stretching exercises.  Questions were encouraged and answered.  Hydrocodone  refilled.

## 2023-06-09 NOTE — Addendum Note (Signed)
 Addended by: Mardene Celeste B on: 06/09/2023 02:05 PM   Modules accepted: Orders

## 2023-06-26 ENCOUNTER — Telehealth: Payer: Self-pay | Admitting: Orthopaedic Surgery

## 2023-07-22 ENCOUNTER — Encounter: Payer: Self-pay | Admitting: Orthopaedic Surgery

## 2023-07-22 ENCOUNTER — Ambulatory Visit (INDEPENDENT_AMBULATORY_CARE_PROVIDER_SITE_OTHER): Payer: Medicare Other | Admitting: Orthopaedic Surgery

## 2023-07-22 DIAGNOSIS — M25551 Pain in right hip: Secondary | ICD-10-CM | POA: Diagnosis not present

## 2023-07-22 NOTE — Progress Notes (Signed)
 The patient is a very frail and elderly 83 year old gentleman who ambulates slowly with a walker.  We replaced his right hip back in June 2024.  Recently he had been having some hip pain and he saw Dwyane Glad who appropriately recommended a psoas tendon injection.  The patient never did have that.  He says he does not feel like he needs an injection.  He does have a lot of gait and balance issues.  He does ambulate slowly with a rolling walker.  He did have a light fall out of his bed last night but landed directly on his backside.  He says he has some memory issues and balance issues as well.  I can easily put his right and left hip through range of motion today with no difficulties or blocks or rotation at all today.  There is no pain over his posterior pelvis or coccyx area.  From my standpoint the biggest thing is him being careful with his mobility and only ambulating ever with a walker.  He should go slow and be careful.  One of the caregivers with him understands this as well.

## 2023-07-28 ENCOUNTER — Telehealth: Payer: Self-pay | Admitting: Physician Assistant

## 2023-07-28 NOTE — Telephone Encounter (Signed)
 Patient has appt with Abraham Hoffmann on 08-11-23 but would like to speak to her before the appt. He states that he has some neurological  problems and wants to know if anything new is in the [place] that could help him. He does not want to rule anything or any type of testing.  Please call

## 2023-07-29 NOTE — Telephone Encounter (Signed)
 Per patient he do not remember what his questions was.   He will call back if he remembers.

## 2023-08-07 ENCOUNTER — Telehealth: Payer: Self-pay

## 2023-08-07 NOTE — Telephone Encounter (Signed)
 Patient called stating he has a "fall" and hit his head and hip He was wondering if he needed to be seen I told him to use ice and rest over the weekend. Hip may be sore from fall, since he says it is not a lot of increased pain. I told him it really wouldn't be a bad idea to get his head checked out ESPECIALLY if he has any vision problems, dizziness, nausea, etc.

## 2023-08-10 ENCOUNTER — Ambulatory Visit: Payer: Self-pay | Admitting: Physician Assistant

## 2023-08-11 ENCOUNTER — Encounter: Payer: Self-pay | Admitting: Physician Assistant

## 2023-08-11 ENCOUNTER — Ambulatory Visit (INDEPENDENT_AMBULATORY_CARE_PROVIDER_SITE_OTHER): Payer: Medicare Other | Admitting: Physician Assistant

## 2023-08-11 VITALS — BP 143/62 | HR 79 | Resp 20 | Ht 70.5 in

## 2023-08-11 DIAGNOSIS — G912 (Idiopathic) normal pressure hydrocephalus: Secondary | ICD-10-CM | POA: Diagnosis not present

## 2023-08-11 DIAGNOSIS — R413 Other amnesia: Secondary | ICD-10-CM | POA: Diagnosis not present

## 2023-08-11 NOTE — Progress Notes (Signed)
 Is   Assessment/Plan:   Mild cognitive impairment, likely multifactorial   Lee Nelson is a delightful 83 y.o. RH male with a history of hypertension, hyperlipidemia, history of hospitalization in February 2024 for first COVID-pneumonia with severe sepsis hypoxic respiratory failure, history of NPH dating back to 2015, cervical spinal stenosis per [facility] note treated conservatively, history of TIA (with right amaurosis fugax, May 2019) on aspirin  daily,  presenting today in follow-up for evaluation of memory loss.  He has subjective memory complaints, stating that "I have dementia ".  MMSE is actually stable at 26/30.  We discussed proceeding with neuropsych evaluation for diagnostic clarity and if indeed there is worsening of memory, for disease trajectory.  His thoughts are tangential.  Patient is on the multitude of supplements, as well as over-the-counter memory products, at this very moment he is not interested in prescribed agents.   Patient states are shorter than prior, and there are times in which he shuffles more than before, without magnetic feet.  He also reports increased urination.  In view of his abnormal imaging in the past for NPH, we discussed repeating MRI of the brain for further evaluation which he agrees.    Recommendations:   Follow up in 6  months. Continue PT/ST/OT with RN, Aide. Continue 24/7 with caregiver assistance No indication for antidementia medication as the risks of them outweigh the benefits  Follow up for NPH, will consider large volume LP if were to be symptomatic  Recommend good control of cardiovascular risk factors Recommend hearing aids for better comprehension Continue to control mood as per PCP    Subjective:   This patient is here alone . Previous records as well as any outside records available were reviewed prior to todays visit.   Patient was last seen on 02/09/2023 with MMSE 26/30.    Any changes in memory since last visit? "  I have  dementia".  He has objective worsening of his memory, reporting that he has more trouble with remembering a recent conversation, but more difficulties to make a decision and if he is under stress, his memory may be worse.  He is quite worried about his memory and at 1 point, he may lose some concentration.  He was mistaking one of his sisters for his mother.  repeats oneself?   "I need to". "Some caregivers don't take notes on how I do things" Disoriented when walking into a room?  Patient denies    Misplacing objects?  Patient denies   Wandering behavior?   denies   Any personality changes since last visit?  " Periodically I have confusion".  Any worsening depression?: "Sometimes I might feel depressed" . Hallucinations or paranoia?  I don't think I see things. "I might have had a break in into my house "-unclear if he did Seizures?   denies    Any sleep changes? I stay up to late and sometimes it may affect my sleep. Sometimes he as vivid dreams, has possible RLS. Denies  or sleepwalking   Sleep apnea?   Denies.  Any hygiene concerns?   Aide helps him. Independent of bathing and dressing?  Endorsed  Does the patient needs help with medications? Aide is in charge   Who is in charge of the finances?  Patient is in charge     Any changes in appetite?  Denies.    Patient have trouble swallowing?  Chronic, he is on a soft diet.   Does the patient cook?  Any  kitchen accidents such as leaving the stove on?   denies   Any headaches?    denies   Vision changes? denies Chronic pain?  Chronic lumbar pain. Ambulates with difficulty?  Uses a walker for stability. Does PT     Recent falls or head injuries?  He fell from his high bed hitting R  parietal area, no LOC,  He "caught himself so the fall was not severe". He denies nausea, dizziness  Unilateral weakness, numbness or tingling?   Denies.   Any tremors?  Denies.   Any anosmia?    Denies.   Any incontinence of urine?  Endorsed, wears pads. Any  bowel dysfunction?  Denies.      Patient lives with his wife and aide.     Does the patient drive? No longer drives        Initial visit 07/2022  The patient is seen in neurologic consultation at the request of Avva, Ravisankar, MD for the evaluation of memory.  Lee Nelson is a very delightful 83 y.o. year old RH male retired Pensions consultant and Social worker Professor at [employer], with a history of hypertension, hyperlipidemia, history of hospitalization in February 2024 for first COVID-pneumonia with severe sepsis hypoxic respiratory failure, history of NPH dating back to 2015, initially followed by [facility],  history of mild cognitive impairment diagnosed by [facility] in 2018, last seen in September 2022 and also seen at [facility], history of gait difficulty likely due to deconditioning, weight loss, cervical spinal stenosis, hip pain and arthritis per [facility] note treated conservatively, history of TIA (with right amaurosis fugax, May 2019) on aspirin  daily, history of migraine with aura in the past followed by PCP, seen today for evaluation of memory loss. MRI brain 02/2022 personally reviewed, was remarkable for advanced atrophy and chronic SVD. Ventricles are enlarged in the setting of atrophy. He needs assistance with ADLs and needs a walker to ambulate due to pain, but on exam he does not show any other signs of NPH (other than memory issues) such as magnetic feet excessive urination, tremors. Patient takes many natural products, including Lion's Mane, Neuriva and other OTC brain supplements, multivitamins etc..In view of advanced age, MRI findings, no recommendations for ACHI or NMDI is indicated and patient agrees.        How long did patient have memory difficulties? He began having some memory issues in 2015. Lately was seen at [facility] diagnosed with Mild Dementia, no meds were recommended. Sometimes Patient has some difficulty remembering recent conversations and people names repeats oneself?  Endorsed "by nature".   Disoriented when walking into a room?  Patient denies Leaving objects in unusual places?   denies   Wandering behavior? denies   Any personality changes since last visit? denies   Any history of depression?:  "Im not sure what the concept is everyone now and then has it". "Not taking medicine for it".  Hallucinations or paranoia?  denies   Seizures? denies    Any sleep changes?  I don't sleep very well. Denies  vivid dreams, REM behavior or sleepwalking   Sleep apnea? denies   Any hygiene concerns?  Denies but some people mentioned that I have not taken a shower in a few days"   Independent of bathing and dressing?  Endorsed Needs some assistance with dressing up. Does the patient need help with medications? Patient is in charge   Who is in charge of the finances?  "Wife is in charge of hers and I am  in charge of mine"   Any changes in appetite?   denies     Trouble swallowing? He is on a soft diet    Any kitchen accidents such as leaving the stove on? Patient denies   Any headaches?  denies   Chronic back pain?  Endorsed, lumbar pain.  Ambulates with difficulty? Uses a walker on a daily basis to prevent falls.  Recent falls or head injuries? "Maybe one" "My R leg and knee gave out" and " 1 weeks ago, and prior to that he was reaching he partially fell " No LOC No recent head injury  No recent vertigo or dizziness  Vision changes? Denies  Unilateral weakness, numbness or tingling?  denies   Any tremors?  denies   Any anosmia?  denies   Any incontinence of urine? "I don't think so, but sometimes I have urge problems, I wear a pad". Any bowel dysfunction?  Recnet constipation  Patient lives wife and aide.  Wife has incomplete quadriplegia    History of heavy alcohol  intake? denies   History of heavy tobacco use? denies   Family history of dementia?  Not that I know of. Brother had PD dementia  Does patient drive? Not anymore.    Extensive films ordered in the past by [facility] are as  follows:    07/20/10 MRI CERVICAL 1. Left C6-C7 paracentral disc protrusion abutting the exiting left C7 nerve roots appears new. Preexisting mild left C7 foraminal stenosis at this level due to facet hypertrophy.  2. Mild combined congenital and acquired spinal stenosis at the C3-C4 and C4-C5.   11/20/10 MRI brain (without contrast) demonstrating: 1. Moderate ventriculomegaly in the temporal and occipital horns.  Likely due to subcortical atrophy. 2. No acute findings are seen.   11/20/10 MRA head (without contrast) demonstrating: - mild atheromatous irregularities in the bilateral carotid siphon regions.   11/19/10 EMG/NCS - normal   11/19/10 carotid u/s - normal   11/18/10 TTE - EF 55-60%; normal wall motion   09/19/13 MRI brain (with and without) demonstrating: 1. Moderate mesial temporal atrophy. 2. Moderate ventriculomegaly on ex vacuo basis. 3. No acute findings.   11/11/16 MRI brain [I reviewed images myself and agree with interpretation. -VRP]  1. No acute intracranial process. 2. Progressed nonspecific moderate to severe parenchymal brain volume loss for age. No hydrocephalus.   02/02/17 MRI cervical spine [I reviewed images myself and agree with interpretation. -VRP]  1. At C3-C4, there is borderline spinal stenosis due to ligamentum flavum hypertrophy and congenitally short pedicles.   There is no nerve root compression. 2. At C4-C5, there is mild spinal stenosis due to disc bulging, ligament of flavum hypertrophy, facet hypertrophy, mild uncovertebral spurring and congenitally short pedicles. There is no nerve root compression. Degenerative changes at this level have slightly progressed compared to the 07/20/2010 MRI. 3. At C5-C6, there are degenerative changes no nerve root compression or spinal stenosis 4. At C6-C7, there are degenerative changes causing mild foraminal narrowing but no nerve root compression.   On the previous MRI there was a disc protrusion to the left that is  not appreciated on the current study. 5. The spinal cord has normal signal.      08/11/17 CAROTID U/S Right Carotid: There was no evidence of thrombus, dissection, atherosclerotic plaque or stenosis in the cervical carotid system.  Left Carotid: Velocities in the left ICA are consistent with a 1-39% stenosis.  Vertebrals:  Bilateral vertebral arteries demonstrate antegrade flow.  Subclavians: Normal  flow hemodynamics were seen in bilateral subclavian arteries.     01/29/18 MRI cervical spine - Mild degenerative changes in the cervical spine. Mild spinal and foraminal stenosis as above.   07/15/20 MRI lumbar spine - Negative for compression fracture. - Mild degenerative changes for age.  No neural compression.      Past Medical History:  Diagnosis Date   Anxiety    Arthritis    Bladder calculus    BPH (benign prostatic hyperplasia)    Chronic constipation    Complication of anesthesia    " I had some coughing afterwards for a couple of days"--  per pt "perfers spinal anesthesia since general anesthesia congnitive issues when older"   Diverticulosis of colon    Dry eye syndrome of both eyes    Environmental allergies    GERD (gastroesophageal reflux disease)    occasional   History of adenomatous polyp of colon    08/ 2004   History of kidney stones    History of squamous cell carcinoma in situ (SCCIS) of skin    s/p  excision 2013 facial areas and 06/ 2016 nose   Migraine    eye migraine occasional   Seasonal and perennial allergic rhinitis    Thrombocytopenia (HCC)    Tingling    hands and feet bilat , intermittantly-- per pt has lumbar bulging disk   Urinary frequency    Vocal fold atrophy    dysphonia-- per pt has to drink large amount of water  to take even on pill   Wears glasses      Past Surgical History:  Procedure Laterality Date   APPENDECTOMY  child   CARDIOVASCULAR STRESS TEST  05-17-2015  dr hilty   Low risk nuclear study w/ no ischemia/  normal LV  function and wall motion , stress ef 54% (lvef 45-54%)   CHOLECYSTECTOMY N/A 09/29/2014   Procedure: LAPAROSCOPIC CHOLECYSTECTOMY WITH INTRAOPERATIVE CHOLANGIOGRAM;  Surgeon: Juanita Norlander, MD;  Location: [facility];  Service: General;  Laterality: N/A;   COLONOSCOPY  last one 09-06-2010   ESOPHAGOGASTRODUODENOSCOPY N/A 12/09/2013   Procedure: ESOPHAGOGASTRODUODENOSCOPY (EGD);  Surgeon: Evangeline Hilts, MD;  Location: [facility] END[facility]COPY;  Service: Endoscopy;  Laterality: N/A;   EXTRACORPOREAL SHOCK WAVE LITHOTRIPSY  yrs ago   INGUINAL HERNIA REPAIR Left child   inguinal hernia repair  09/2017   NISSEN FUNDOPLICATION  1980's   open   STONE EXTRACTION WITH BASKET N/A 08/18/2016   Procedure: STONE EXTRACTION WITH BASKET;  Surgeon: Annamarie Kid, MD;  Location: [facility];  Service: Urology;  Laterality: N/A;   THULIUM LASER TURP (TRANSURETHRAL RESECTION OF PROSTATE) N/A 08/18/2016   Procedure: THULIUM LASER BLADDER NECK INCISION AND BLADDER STONE REMOVAL;  Surgeon: Annamarie Kid, MD;  Location: [facility];  Service: Urology;  Laterality: N/A;   TONSILLECTOMY  child   TOTAL HIP ARTHROPLASTY Right 09/07/2022   Procedure: TOTAL HIP ARTHROPLASTY ANTERIOR APPROACH;  Surgeon: Arnie Lao, MD;  Location: [facility];  Service: Orthopedics;  Laterality: Right;   TRANSTHORACIC ECHOCARDIOGRAM  11/18/2010   grade 1 diastolic dysfunction, ef 55-60%/  trivial MR and TR/ mild dilated RA     PREVIOUS MEDICATIONS:   CURRENT MEDICATIONS:  Outpatient Encounter Medications as of 08/11/2023  Medication Sig   B Complex Vitamins (VITAMIN B COMPLEX) TABS Take 1 tablet by mouth daily.   Cholecalciferol  (VITAMIN D -3) 1000 units CAPS Take 2,000 Units by mouth daily.    Cranberry 400 MG CAPS Take 400-1,200 mg by mouth  every morning.    docusate sodium  (COLACE) 100 MG capsule Take 1 capsule (100 mg total) by mouth 2 (two) times daily.   HYDROcodone -acetaminophen  (NORCO/VICODIN) 5-325  MG tablet Take 1-2 tablets by mouth every 6 (six) hours as needed for moderate pain (pain score 4-6).   MAGNESIUM CITRATE PO Take 75 mg by mouth daily.   melatonin 3 MG TABS tablet Take 1 tablet (3 mg total) by mouth at bedtime.   Menaquinone-7 (VITAMIN K2 PO) Take 120-240 mg by mouth daily. Combo w/ Vit. D   NON FORMULARY Take 1 capsule by mouth daily. Acetyl L carnitine   ondansetron  (ZOFRAN ) 4 MG tablet Take 1 tablet (4 mg total) by mouth every 6 (six) hours as needed for nausea or vomiting.   OVER THE COUNTER MEDICATION Take 3 capsules by mouth 2 (two) times daily. Lion's mane 0.5 gram/capsule   OVER THE COUNTER MEDICATION Take 2 capsules by mouth every morning. Reparagen supplement   Phosphatidylserine 100 MG CAPS Take 100 mg by mouth every morning.    polyethylene glycol (MIRALAX  / GLYCOLAX ) 17 g packet Take 17 g by mouth daily as needed for mild constipation or moderate constipation.   Probiotic Product (PROBIOTIC DAILY PO) Take 1 capsule by mouth daily.    Propylene Glycol (SYSTANE BALANCE) 0.6 % SOLN Apply 1 drop to eye daily as needed (dry eyes).    senna-docusate (SENOKOT-S) 8.6-50 MG tablet Take 1 tablet by mouth at bedtime.   sildenafil (REVATIO) 20 MG tablet Take 20 mg by mouth as needed.   sodium chloride  (MURO 128) 5 % ophthalmic ointment Place 1 application into both eyes at bedtime.    UNABLE TO FIND Med Name: cocovia memory   vitamin C  (ASCORBIC ACID ) 250 MG tablet Take 250 mg by mouth daily.   zinc  sulfate 220 (50 Zn) MG capsule Take 1 capsule (220 mg total) by mouth daily.   No facility-administered encounter medications on file as of 08/11/2023.     Objective:     PHYSICAL EXAMINATION:    VITALS:   Vitals:   08/11/23 1414  BP: (!) 143/62  Pulse: 79  Resp: 20  SpO2: 97%  Height: 5' 10.5" (1.791 m)    GEN:  The patient appears stated age and is in NAD. HEENT:  Normocephalic, atraumatic.   Neurological examination:  General: NAD, well-groomed, appears  stated age. Orientation: The patient is alert. Oriented to person, place and date Cranial nerves: There is good facial symmetry. The speech is fluent and clear, tangential, sometimes with a flight of ideas. No aphasia or dysarthria. Fund of knowledge is appropriate. Recent memory impaired and remote memory is normal.  Attention and concentration are normal.  Able to name objects and repeat phrases.  Hearing is intact to conversational tone.   Delayed recall 3/3 Sensation: Sensation is intact to light touch throughout Motor: Strength is at least antigravity x4. DTR's 2/4 in UE/LE      08/06/2022   12:00 PM  Montreal Cognitive Assessment   Visuospatial/ Executive (0/5) 2  Naming (0/3) 3  Attention: Read list of digits (0/2) 2  Attention: Read list of letters (0/1) 1  Attention: Serial 7 subtraction starting at 100 (0/3) 3  Language: Repeat phrase (0/2) 2  Language : Fluency (0/1) 1  Abstraction (0/2) 2  Delayed Recall (0/5) 4  Orientation (0/6) 5  Total 25  Adjusted Score (based on education) 25       08/11/2023    6:00 PM 02/09/2023  12:00 PM 12/11/2020    9:02 AM  MMSE - Mini Mental State Exam  Orientation to time 3 4 5   Orientation to Place 5 5 5   Registration 3 3 3   Attention/ Calculation 4 3 4   Recall 3 3 3   Language- name 2 objects 2 2 2   Language- repeat 1 1 1   Language- follow 3 step command 3 3 3   Language- read & follow direction 1 1 1   Write a sentence 1 1 1   Copy design 0 0 1  Total score 26 26 29        Movement examination: Tone: There is normal tone in the UE/LE Abnormal movements:  no tremor.  No myoclonus.  No asterixis.   Coordination:  There is no decremation with RAM's. Normal finger to nose  Gait and Station: The patient has no difficulty arising out of a deep-seated chair without the use of the hands. The patient's stride length is very short . Needs a walker. Gait is cautious and narrow, intermittent shuffling .   Thank you for allowing us  the  opportunity to participate in the care of this nice patient. Please do not hesitate to contact us  for any questions or concerns.   Total time spent on today's visit was dedicated to this patient today, preparing to see patient, examining the patient, ordering tests and/or medications and counseling the patient, documenting clinical information in the EHR or other health record, independently interpreting results and communicating results to the patient/family, discussing treatment and goals, answering patient's questions and coordinating care.  Cc:  Avva, Ravisankar, MD  Tex Filbert 08/11/2023 6:18 PM

## 2023-08-11 NOTE — Patient Instructions (Addendum)
 It was a pleasure to see you today at our office.   Recommendations:   Follow up Dec 2 at 11:30  Repeat the MRI brain at [facility] [phone] Neuropsychological evaluation      For assessment of decision of mental capacity and competency:  Call Dr. Laverne Potter, geriatric psychiatrist at [phone] Counseling regarding caregiver distress, including caregiver depression, anxiety and issues regarding community resources, adult day care programs, adult living facilities, or memory care questions:  please contact your  Primary Doctor's Social Worker  Whom to call:  Memory  decline, memory medications: Call our office [phone]  For psychiatric meds, mood meds: Please have your primary care physician manage these medications.  If you have any severe symptoms of a stroke, or other severe issues such as confusion,severe chills or fever, etc call 911 or go to the ER as you may need to be evaluated further    RECOMMENDATIONS FOR ALL PATIENTS WITH MEMORY PROBLEMS: 1. Continue to exercise (Recommend 30 minutes of walking everyday, or 3 hours every week) 2. Increase social interactions - continue going to [place] and enjoy social gatherings with friends and family 3. Eat healthy, avoid fried foods and eat more fruits and vegetables 4. Maintain adequate blood pressure, blood sugar, and blood cholesterol level. Reducing the risk of stroke and cardiovascular disease also helps promoting better memory. 5. Avoid stressful situations. Live a simple life and avoid aggravations. Organize your time and prepare for the next day in anticipation. 6. Sleep well, avoid any interruptions of sleep and avoid any distractions in the bedroom that may interfere with adequate sleep quality 7. Avoid sugar, avoid sweets as there is a strong link between excessive sugar intake, diabetes, and cognitive impairment We discussed the Mediterranean diet, which has been shown to help patients reduce the risk of  progressive memory disorders and reduces cardiovascular risk. This includes eating fish, eat fruits and green leafy vegetables, nuts like almonds and hazelnuts, walnuts, and also use olive oil. Avoid fast foods and fried foods as much as possible. Avoid sweets and sugar as sugar use has been linked to worsening of memory function.  There is always a concern of gradual progression of memory problems. If this is the case, then we may need to adjust level of care according to patient needs. Support, both to the patient and caregiver, should then be put into place.     FALL PRECAUTIONS: Be cautious when walking. Scan the area for obstacles that may increase the risk of trips and falls. When getting up in the mornings, sit up at the edge of the bed for a few minutes before getting out of bed. Consider elevating the bed at the head end to avoid drop of blood pressure when getting up. Walk always in a well-lit room (use night lights in the walls). Avoid area rugs or power cords from appliances in the middle of the walkways. Use a walker or a cane if necessary and consider physical therapy for balance exercise. Get your eyesight checked regularly.  FINANCIAL OVERSIGHT: Supervision, especially oversight when making financial decisions or transactions is also recommended.  HOME SAFETY: Consider the safety of the kitchen when operating appliances like stoves, microwave oven, and blender. Consider having supervision and share cooking responsibilities until no longer able to participate in those. Accidents with firearms and other hazards in the house should be identified and addressed as well.   ABILITY TO BE LEFT ALONE: If patient is unable to contact 911 operator, consider using  LifeLine, or when the need is there, arrange for someone to stay with patients. Smoking is a fire hazard, consider supervision or cessation. Risk of wandering should be assessed by caregiver and if detected at any point, supervision and  safe proof recommendations should be instituted.  MEDICATION SUPERVISION: Inability to self-administer medication needs to be constantly addressed. Implement a mechanism to ensure safe administration of the medications.   DRIVING: Regarding driving, in patients with progressive memory problems, driving will be impaired. We advise to have someone else do the driving if trouble finding directions or if minor accidents are reported. Independent driving assessment is available to determine safety of driving.   If you are interested in the driving assessment, you can contact the following:  [employer] in [place] [phone]  [facility] [phone]  [facility] [phone] or [phone]    Mediterranean Diet A Mediterranean diet refers to food and lifestyle choices that are based on the traditions of countries located on the [employer]. This way of eating has been shown to help prevent certain conditions and improve outcomes for people who have chronic diseases, like kidney disease and heart disease. What are tips for following this plan? Lifestyle  Cook and eat meals together with your family, when possible. Drink enough fluid to keep your urine clear or pale yellow. Be physically active every day. This includes: Aerobic exercise like running or swimming. Leisure activities like gardening, walking, or housework. Get 7-8 hours of sleep each night. If recommended by your health care provider, drink red wine in moderation. This means 1 glass a day for nonpregnant women and 2 glasses a day for men. A glass of wine equals 5 oz (150 mL). Reading food labels  Check the serving size of packaged foods. For foods such as rice and pasta, the serving size refers to the amount of cooked product, not dry. Check the total fat in packaged foods. Avoid foods that have saturated fat or trans fats. Check the  ingredients list for added sugars, such as corn syrup. Shopping  At the grocery store, buy most of your food from the areas near the walls of the store. This includes: Fresh fruits and vegetables (produce). Grains, beans, nuts, and seeds. Some of these may be available in unpackaged forms or large amounts (in bulk). Fresh seafood. Poultry and eggs. Low-fat dairy products. Buy whole ingredients instead of prepackaged foods. Buy fresh fruits and vegetables in-season from local farmers markets. Buy frozen fruits and vegetables in resealable bags. If you do not have access to quality fresh seafood, buy precooked frozen shrimp or canned fish, such as tuna, salmon, or sardines. Buy small amounts of raw or cooked vegetables, salads, or olives from the deli or salad bar at your store. Stock your pantry so you always have certain foods on hand, such as olive oil, canned tuna, canned tomatoes, rice, pasta, and beans. Cooking  Cook foods with extra-virgin olive oil instead of using butter or other vegetable oils. Have meat as a side dish, and have vegetables or grains as your main dish. This means having meat in small portions or adding small amounts of meat to foods like pasta or stew. Use beans or vegetables instead of meat in common dishes like chili or lasagna. Experiment with different cooking methods. Try roasting or broiling vegetables instead of steaming or sauteing them. Add frozen vegetables to soups, stews, pasta, or rice. Add nuts or seeds for added healthy fat at  each meal. You can add these to yogurt, salads, or vegetable dishes. Marinate fish or vegetables using olive oil, lemon juice, garlic, and fresh herbs. Meal planning  Plan to eat 1 vegetarian meal one day each week. Try to work up to 2 vegetarian meals, if possible. Eat seafood 2 or more times a week. Have healthy snacks readily available, such as: Vegetable sticks with hummus. Greek yogurt. Fruit and nut trail mix. Eat  balanced meals throughout the week. This includes: Fruit: 2-3 servings a day Vegetables: 4-5 servings a day Low-fat dairy: 2 servings a day Fish, poultry, or lean meat: 1 serving a day Beans and legumes: 2 or more servings a week Nuts and seeds: 1-2 servings a day Whole grains: 6-8 servings a day Extra-virgin olive oil: 3-4 servings a day Limit red meat and sweets to only a few servings a month What are my food choices? Mediterranean diet Recommended Grains: Whole-grain pasta. Brown rice. Bulgar wheat. Polenta. Couscous. Whole-wheat bread. Dwyane Glad. Vegetables: Artichokes. Beets. Broccoli. Cabbage. Carrots. Eggplant. Green beans. Chard. Kale. Spinach. Onions. Leeks. Peas. Squash. Tomatoes. Peppers. Radishes. Fruits: Apples. Apricots. Avocado. Berries. Bananas. Cherries. Dates. Figs. Grapes. Lemons. Melon. Oranges. Peaches. Plums. Pomegranate. Meats and other protein foods: Beans. Almonds. Sunflower seeds. Pine nuts. Peanuts. Cod. Salmon. Scallops. Shrimp. Tuna. Tilapia. Clams. Oysters. Eggs. Dairy: Low-fat milk. Cheese. Greek yogurt. Beverages: Water . Red wine. Herbal tea. Fats and oils: Extra virgin olive oil. Avocado oil. Grape seed oil. Sweets and desserts: [place] yogurt with honey. Baked apples. Poached pears. Trail mix. Seasoning and other foods: Basil. Cilantro. Coriander. Cumin. Mint. Parsley. Sage. Rosemary. Tarragon. Garlic. Oregano. Thyme. Pepper. Balsalmic vinegar. Tahini. Hummus. Tomato sauce. Olives. Mushrooms. Limit these Grains: Prepackaged pasta or rice dishes. Prepackaged cereal with added sugar. Vegetables: Deep fried potatoes (french fries). Fruits: Fruit canned in syrup. Meats and other protein foods: Beef. Pork. Lamb. Poultry with skin. Hot dogs. Helene Loader. Dairy: Ice cream. Sour cream. Whole milk. Beverages: Juice. Sugar-sweetened soft drinks. Beer. Liquor and spirits. Fats and oils: Butter. Canola oil. Vegetable oil. Beef fat (tallow). Lard. Sweets and desserts:  Cookies. Cakes. Pies. Candy. Seasoning and other foods: Mayonnaise. Premade sauces and marinades. The items listed may not be a complete list. Talk with your dietitian about what dietary choices are right for you. Summary The Mediterranean diet includes both food and lifestyle choices. Eat a variety of fresh fruits and vegetables, beans, nuts, seeds, and whole grains. Limit the amount of red meat and sweets that you eat. Talk with your health care provider about whether it is safe for you to drink red wine in moderation. This means 1 glass a day for nonpregnant women and 2 glasses a day for men. A glass of wine equals 5 oz (150 mL). This information is not intended to replace advice given to you by your health care provider. Make sure you discuss any questions you have with your health care provider. Document Released: 11/08/2015 Document Revised: 12/11/2015 Document Reviewed: 11/08/2015 Elsevier Interactive Patient Education  2017 [employer].

## 2023-08-14 ENCOUNTER — Emergency Department (HOSPITAL_BASED_OUTPATIENT_CLINIC_OR_DEPARTMENT_OTHER)

## 2023-08-14 ENCOUNTER — Other Ambulatory Visit: Payer: Self-pay

## 2023-08-14 ENCOUNTER — Emergency Department (HOSPITAL_BASED_OUTPATIENT_CLINIC_OR_DEPARTMENT_OTHER): Admission: EM | Admit: 2023-08-14 | Discharge: 2023-08-15 | Disposition: A | Discharge status: Home / Self Care

## 2023-08-14 ENCOUNTER — Emergency Department (HOSPITAL_BASED_OUTPATIENT_CLINIC_OR_DEPARTMENT_OTHER): Admitting: Radiology

## 2023-08-14 DIAGNOSIS — M25552 Pain in left hip: Secondary | ICD-10-CM | POA: Insufficient documentation

## 2023-08-14 DIAGNOSIS — S0990XA Unspecified injury of head, initial encounter: Secondary | ICD-10-CM | POA: Diagnosis not present

## 2023-08-14 DIAGNOSIS — W1839XA Other fall on same level, initial encounter: Secondary | ICD-10-CM | POA: Diagnosis not present

## 2023-08-14 MED ORDER — HYDROCODONE-ACETAMINOPHEN 5-325 MG PO TABS
1.0000 | ORAL_TABLET | Freq: Once | ORAL | Status: AC
  Administered 2023-08-14: 1 via ORAL
  Filled 2023-08-14: qty 1

## 2023-08-14 MED ORDER — ACETAMINOPHEN 325 MG PO TABS
650.0000 mg | ORAL_TABLET | Freq: Once | ORAL | Status: AC
  Administered 2023-08-14: 650 mg via ORAL
  Filled 2023-08-14: qty 2

## 2023-08-14 NOTE — ED Notes (Signed)
 Address confirmed with Pt and care giver for [facility]... Provider confirmed [facility] to transport Pt home... PT still questioning transfer home, Provider informed.Lee AasAaron Nelson

## 2023-08-14 NOTE — ED Provider Notes (Signed)
 [place] EMERGENCY DEPARTMENT AT [facility] Provider Note   CSN: [id number] Arrival date & time: 08/14/23  1519     History  Chief Complaint  Patient presents with   Hip Pain    Lee Nelson is a 83 y.o. male.  83 year old not on blood thinners with mechanical fall today.  Landed on left side complaining of left hip pain.  No headache, no LOC, no nausea or vomiting.  Caretakers are at bedside and states that he has at his baseline mentation.  Denies chest pain, no shortness of breath no abdominal pain.   Hip Pain       Home Medications Prior to Admission medications   Medication Sig Start Date End Date Taking? Authorizing Provider  B Complex Vitamins (VITAMIN B COMPLEX) TABS Take 1 tablet by mouth daily.    [provider]  Cholecalciferol  (VITAMIN D -3) 1000 units CAPS Take 2,000 Units by mouth daily.     [provider]  Cranberry 400 MG CAPS Take 400-1,200 mg by mouth every morning.     [provider]  docusate sodium  (COLACE) 100 MG capsule Take 1 capsule (100 mg total) by mouth 2 (two) times daily. 09/09/22   Rai, Hurman Maiden, MD  HYDROcodone -acetaminophen  (NORCO/VICODIN) 5-325 MG tablet Take 1-2 tablets by mouth every 6 (six) hours as needed for moderate pain (pain score 4-6). 06/09/23   Bronson Canny, PA-C  MAGNESIUM CITRATE PO Take 75 mg by mouth daily.    [provider]  melatonin 3 MG TABS tablet Take 1 tablet (3 mg total) by mouth at bedtime. 05/15/22   Samtani, Jai-Gurmukh, MD  Menaquinone-7 (VITAMIN K2 PO) Take 120-240 mg by mouth daily. Combo w/ Vit. D    [provider]  NON FORMULARY Take 1 capsule by mouth daily. Acetyl L carnitine    [provider]  ondansetron  (ZOFRAN ) 4 MG tablet Take 1 tablet (4 mg total) by mouth every 6 (six) hours as needed for nausea or vomiting. 09/09/22   Rai, Ripudeep K, MD  OVER THE COUNTER MEDICATION Take 3 capsules by mouth 2 (two) times daily. Lion's mane 0.5  gram/capsule    [provider]  OVER THE COUNTER MEDICATION Take 2 capsules by mouth every morning. Reparagen supplement    [provider]  Phosphatidylserine 100 MG CAPS Take 100 mg by mouth every morning.     [provider]  polyethylene glycol (MIRALAX  / GLYCOLAX ) 17 g packet Take 17 g by mouth daily as needed for mild constipation or moderate constipation. 09/09/22   Rai, Hurman Maiden, MD  Probiotic Product (PROBIOTIC DAILY PO) Take 1 capsule by mouth daily.     [provider]  Propylene Glycol (SYSTANE BALANCE) 0.6 % SOLN Apply 1 drop to eye daily as needed (dry eyes).     [provider]  senna-docusate (SENOKOT-S) 8.6-50 MG tablet Take 1 tablet by mouth at bedtime. 05/15/22   Samtani, Jai-Gurmukh, MD  sildenafil (REVATIO) 20 MG tablet Take 20 mg by mouth as needed.    [provider]  sodium chloride  (MURO 128) 5 % ophthalmic ointment Place 1 application into both eyes at bedtime.     [provider]  UNABLE TO FIND Med Name: cocovia memory    [provider]  vitamin C  (ASCORBIC ACID ) 250 MG tablet Take 250 mg by mouth daily.    [provider]  zinc  sulfate 220 (50 Zn) MG capsule Take 1 capsule (220 mg total) by  mouth daily. 05/15/22   Samtani, Jai-Gurmukh, MD      Allergies    Ciprofloxacin, Flagyl [metronidazole], Metoclopramide  hcl, Other, Propofol , Risperidone, Silodosin, Soy allergy (obsolete), and Sulfamethoxazole-trimethoprim    Review of Systems   Review of Systems  Physical Exam Updated Vital Signs BP (!) 151/66 (BP Location: Right Arm)   Pulse 91   Temp 98.7 F (37.1 C) (Oral)   Resp 18   Ht 5' 10.5" (1.791 m)   Wt 51.7 kg   SpO2 95%   BMI 16.13 kg/m  Physical Exam Vitals and nursing note reviewed.  HENT:     Head: Normocephalic.     Mouth/Throat:     Mouth: Mucous membranes are moist.  Eyes:     General:        Right eye: No discharge.     Conjunctiva/sclera: Conjunctivae  normal.  Cardiovascular:     Rate and Rhythm: Normal rate.  Pulmonary:     Effort: Pulmonary effort is normal.     Breath sounds: Normal breath sounds.  Abdominal:     General: Abdomen is flat. There is no distension.     Palpations: Abdomen is soft.     Tenderness: There is no abdominal tenderness. There is no guarding or rebound.  Musculoskeletal:     Cervical back: Normal range of motion. No tenderness.     Comments: No tenderness to the upper extremities.  Some minor tenderness to the left hip.  5 out of 5 plantarflexion dorsiflexion.  Normal sensations.  Equal pulses in all extremities.  Skin:    General: Skin is warm and dry.     Capillary Refill: Capillary refill takes less than 2 seconds.  Neurological:     Mental Status: He is oriented to person, place, and time.  Psychiatric:        Mood and Affect: Mood normal.        Behavior: Behavior normal.     ED Results / Procedures / Treatments   Labs (all labs ordered are listed, but only abnormal results are displayed) Labs Reviewed - No data to display  EKG None  Radiology DG Hip Unilat W or Wo Pelvis 2-3 Views Left Result Date: 08/14/2023 CLINICAL DATA:  Fall, left hip pain EXAM: DG HIP (WITH OR WITHOUT PELVIS) 2-3V LEFT COMPARISON:  12/18/2021 FINDINGS: Prior right hip replacement. No hardware complicating feature. No acute bony abnormality. Specifically, no fracture, subluxation, or dislocation. Mild degenerative changes in the left hip joint. IMPRESSION: No acute bony abnormality. Electronically Signed   By: Janeece Mechanic M.D.   On: 08/14/2023 18:10   CT Head Wo Contrast Result Date: 08/14/2023 CLINICAL DATA:  Trauma EXAM: CT HEAD WITHOUT CONTRAST TECHNIQUE: Contiguous axial images were obtained from the base of the skull through the vertex without intravenous contrast. RADIATION DOSE REDUCTION: This exam was performed according to the departmental dose-optimization program which includes automated exposure control,  adjustment of the mA and/or kV according to patient size and/or use of iterative reconstruction technique. COMPARISON:  Head CT 05/06/2022. FINDINGS: Brain: No evidence of acute infarction, hemorrhage, extra-axial collection or mass lesion/mass effect. Mild diffuse atrophy is unchanged. Diffuse ventricular dilatation is unchanged. Vascular: No hyperdense vessel or unexpected calcification. Skull: Normal. Negative for fracture or focal lesion. Sinuses/Orbits: No acute finding. There is a small polyp or mucous retention cyst in the right sphenoid sinus Other: None. IMPRESSION: 1. No acute intracranial process. 2. Unchanged diffuse ventricular dilatation suggestive of normal pressure hydrocephalus. Electronically Signed   By:  Tyron Gallon M.D.   On: 08/14/2023 17:05    Procedures Procedures    Medications Ordered in ED Medications  HYDROcodone -acetaminophen  (NORCO/VICODIN) 5-325 MG per tablet 1 tablet (1 tablet Oral Given 08/14/23 1731)    ED Course/ Medical Decision Making/ A&P Clinical Course as of 08/14/23 1909  Fri Aug 14, 2023  1906 X-ray CT head negative.  Was given dose of his home narcotics.  He was able to get up and ambulate.  Will discharge in stable condition. [TY]    Clinical Course User Index [TY] Rolinda Climes, DO                                 Medical Decision Making This is an 83 year old male presenting to the emergency department after a fall.  He is afebrile mildly hypertensive, nontachycardic maintaining oxygen saturation on room air.  No obvious signs of trauma.  Some minor tenderness to his left hip.  Per chart review is not on blood thinners.  CT head to evaluate for intracranial trauma.  Will also get x-ray of hip.  Will give home narcotic.  Consider labs, however patient well-appearing, low suspicion for acute intra-abdominal pathology/trauma.  Also, fall seemingly was mechanical in nature.  Low suspicion for cardiac etiology.  If scans negative and patient able to  ambulate likely can discharge home.  See ED course for further MDM disposition.  Amount and/or Complexity of Data Reviewed Independent Historian:     Details: Caregivers at bedside note that patient is at his normal mental baseline and that he has some ambulatory dysfunction at baseline and walks minimally with rolling walker. Radiology: ordered.  Risk Prescription drug management.         Final Clinical Impression(s) / ED Diagnoses Final diagnoses:  None    Rx / DC Orders ED Discharge Orders     None         Rolinda Climes, DO 08/14/23 1909

## 2023-08-14 NOTE — ED Notes (Signed)
 Spoke with pt and caregiver at length about the importance of pt following up with PMD regarding a need for home health assessment for safety modalities as the pt was concerned about being discharged home. When asked what his specific concerns were, it was the lack of assistive devices he had available to him  and how to initiate any needed referral.

## 2023-08-14 NOTE — Discharge Instructions (Signed)
 May take over-the-counter medication such as Tylenol  for baseline pain control.  You may also take your home Norco for further pain control.  Please follow-up with your primary doctor.  Return if you develop sudden onset headache, altered mental status, chest pain, shortness of breath, severe abdominal pain inability eat or drink due to nausea vomiting, stop having bowel movements or any new or worsening symptoms that are concerning to you.

## 2023-08-14 NOTE — ED Notes (Signed)
-  Called [facility] at 743pm for transportation, patient is third on list.

## 2023-08-14 NOTE — ED Notes (Signed)
 Pt was able to walk with slight assistance and the walker... Pt complained of pain to the left hip... Provider witnessed the ambulation and was informed of the pain... Provider was going to talk to the Pt and family.Aaron AasAaron Aas

## 2023-08-14 NOTE — ED Triage Notes (Incomplete)
 Pt caox4,

## 2023-08-14 NOTE — ED Notes (Signed)
 Pt requesting more meds. Will discuss with EDP at this time.

## 2023-08-17 ENCOUNTER — Encounter: Payer: Self-pay | Admitting: Physician Assistant

## 2023-08-17 ENCOUNTER — Ambulatory Visit: Admitting: Physician Assistant

## 2023-08-17 DIAGNOSIS — M25552 Pain in left hip: Secondary | ICD-10-CM

## 2023-08-17 NOTE — Progress Notes (Signed)
 HPI: Patient is 83 year old male well-known to Dr. Alvester Johnson service.  Comes in today due to a slip and fall last Friday onto his left hip.  He is seen at [facility] where radiographs of the left hip including AP pelvis were performed also head scan was performed.  He has been unable to bear weight on the left hip since the fall.  He was walking when he slipped and fell.  He lives at home but has caregivers.  He presents today in a wheelchair.  He has been taking hydrocodone  for the pain. Radiographs AP pelvis 2 views of the left hip personally reviewed.  These show no acute fracture.  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  Left hip is well located.  No acute fractures.  Review of systems: See HPI otherwise negative  Physical exam: General Well-developed well-nourished male seated in wheelchair in no acute distress. Left hip any attempts of range of motion causes pain.  He has pain over the proximal left hip with palpation.  Left calf supple nontender.  Good range of motion of the left knee ankle foot.  Impression: Left hip pain acute  Plan: Given patient's osteoporosis and is inability to bear weight on the left hip we will obtain a CT scan to rule out occult fracture.  Will call him with the results.  In the interim he is nonweightbearing left hip no abduction of the left hip.  Questions were encouraged and answered at length caregivers were present throughout the visit today.

## 2023-08-18 ENCOUNTER — Telehealth: Payer: Self-pay | Admitting: Orthopaedic Surgery

## 2023-08-18 NOTE — Telephone Encounter (Signed)
 Felicia from [facility] care called requesting an urgent call back concerning pt STAT CT Scan. Please call Felicia at [phone].

## 2023-08-18 NOTE — Telephone Encounter (Signed)
 I called Felicia, advised, have sent message to April P to see about getting scan done today.

## 2023-08-19 ENCOUNTER — Other Ambulatory Visit: Payer: Self-pay

## 2023-08-19 ENCOUNTER — Telehealth: Payer: Self-pay | Admitting: Physician Assistant

## 2023-08-19 ENCOUNTER — Ambulatory Visit (HOSPITAL_COMMUNITY)
Admission: RE | Admit: 2023-08-19 | Discharge: 2023-08-19 | Disposition: A | Discharge status: Home / Self Care | Source: Ambulatory Visit | Attending: Physician Assistant | Admitting: Physician Assistant

## 2023-08-19 ENCOUNTER — Telehealth: Payer: Self-pay

## 2023-08-19 DIAGNOSIS — M25551 Pain in right hip: Secondary | ICD-10-CM

## 2023-08-19 DIAGNOSIS — M25552 Pain in left hip: Secondary | ICD-10-CM | POA: Insufficient documentation

## 2023-08-19 DIAGNOSIS — S72011A Unspecified intracapsular fracture of right femur, initial encounter for closed fracture: Secondary | ICD-10-CM

## 2023-08-19 NOTE — Telephone Encounter (Signed)
 Patient and caregiver would like to be called about how to care for fracture that was found on recent CT scan. Please call Ronnie Colander at [phone]

## 2023-08-19 NOTE — Telephone Encounter (Signed)
 Ronnie Colander called for patient his assisting caregiver, stating that he stop taking the medication because it made him sick and if you could put in for him to have PT. She stated that gil gave instructions but he can't move. CB#[phone]

## 2023-08-20 ENCOUNTER — Other Ambulatory Visit: Payer: Self-pay | Admitting: Physician Assistant

## 2023-08-20 MED ORDER — ONDANSETRON 4 MG PO TBDP
4.0000 mg | ORAL_TABLET | Freq: Three times a day (TID) | ORAL | 0 refills | Status: AC | PRN
Start: 2023-08-20 — End: ?

## 2023-08-21 ENCOUNTER — Other Ambulatory Visit: Payer: Self-pay

## 2023-08-21 ENCOUNTER — Emergency Department (HOSPITAL_COMMUNITY)
Admission: EM | Admit: 2023-08-21 | Discharge: 2023-08-21 | Disposition: A | Discharge status: Home / Self Care | Attending: Emergency Medicine | Admitting: Emergency Medicine

## 2023-08-21 ENCOUNTER — Emergency Department (HOSPITAL_COMMUNITY)

## 2023-08-21 ENCOUNTER — Encounter (HOSPITAL_COMMUNITY): Payer: Self-pay

## 2023-08-21 DIAGNOSIS — S72115A Nondisplaced fracture of greater trochanter of left femur, initial encounter for closed fracture: Secondary | ICD-10-CM | POA: Diagnosis not present

## 2023-08-21 DIAGNOSIS — M25552 Pain in left hip: Secondary | ICD-10-CM | POA: Diagnosis present

## 2023-08-21 MED ORDER — ACETAMINOPHEN 500 MG PO TABS: 500.0000 mg | ORAL_TABLET | Freq: Four times a day (QID) | ORAL | 0 refills | Status: AC | PRN

## 2023-08-21 MED ORDER — OXYCODONE-ACETAMINOPHEN 5-325 MG PO TABS: 1.0000 | ORAL_TABLET | Freq: Three times a day (TID) | ORAL | 0 refills | Status: DC | PRN

## 2023-08-21 MED ORDER — ONDANSETRON 4 MG PO TBDP
4.0000 mg | ORAL_TABLET | Freq: Once | ORAL | Status: AC
  Administered 2023-08-21: 4 mg via ORAL
  Filled 2023-08-21: qty 1

## 2023-08-21 MED ORDER — LIDOCAINE 4 % EX PTCH: 1.0000 | MEDICATED_PATCH | Freq: Two times a day (BID) | CUTANEOUS | 0 refills | Status: AC

## 2023-08-21 MED ORDER — OXYCODONE HCL 5 MG PO TABS
5.0000 mg | ORAL_TABLET | Freq: Once | ORAL | Status: AC
  Administered 2023-08-21: 5 mg via ORAL
  Filled 2023-08-21: qty 1

## 2023-08-21 NOTE — Discharge Instructions (Addendum)
 The xray of the hip shows that your fracture is still stable. I have ordered some pain medicine for you.  Take Tylenol  around-the-clock.  Take the stronger pain medicine only as needed.  You should not be bearing any weight on that leg.  Orthopedic surgery should be calling you for a follow-up appointment if you do not have an appointment.  If you do not hear back from them by Tuesday, then please call the number provided and make sure you are seen the week after.

## 2023-08-21 NOTE — ED Notes (Signed)
 Friday. Per Dollie Freshwater (assistant to the patient) patient was not outside, initial fall was last Friday at the grocery store and did xray at [facility] with no significant result. Monday xray and Dr Candance Certain ([facility]) had a fracture on left hip and recommended CT and was done on Wednesday. Yesterday around 1130 pm, patient twisted his foot on the wheelchair and has been complaining of pain since. Pt have been bearing weight as tolerated with assistance on standing, with no constant walking, wearing gait-belt all the time and has 2 person assist since

## 2023-08-21 NOTE — ED Triage Notes (Signed)
 Arrives POV with home caregiver.   Seen at [facility] 5/16 post fall to left hip. Had outpatient CT of hip several days ago with (+) fracture.   Last night was being assisted to bed when he stumbled with his left leg. Denies any new falls.   Having severe left hip pain that he didn't have previously.

## 2023-08-21 NOTE — ED Provider Notes (Signed)
 [place] [facility] AT [facility] Provider Note   CSN: [id number] Arrival date & time: 08/21/23  5621     History  Chief Complaint  Patient presents with   Hip Pain    Lee Nelson is a 83 y.o. male.  HPI    83 year old male comes in with chief complaint of hip pain.  Patient accompanied by caregiver.  According to the caregiver, patient was diagnosed with a minor hip fracture by orthopedic surgery.  Plan is for patient to be followed up in the clinic, he has nonoperative injury.  Patient however has been wheelchair-bound because of the injury.  Yesterday, one of the caregivers was pushing the wheelchair.  Patient's leg had slipped from the stair up and hit the ground.  The caregiver was unaware of the leg sliding down, and so patient ended up having an injury to his leg.  Ever since then he has been having severe pain to his left hip area.  He was able to sleep, but he states that he feels very uncomfortable with any movement.  He has taken Tylenol  for pain today.  Home Medications Prior to Admission medications   Medication Sig Start Date End Date Taking? Authorizing Provider  acetaminophen  (TYLENOL ) 500 MG tablet Take 1 tablet (500 mg total) by mouth every 6 (six) hours as needed. 08/21/23  Yes Deatra Face, MD  lidocaine  4 % Place 1 patch onto the skin 2 (two) times daily. 08/21/23  Yes Deatra Face, MD  oxyCODONE -acetaminophen  (PERCOCET/ROXICET) 5-325 MG tablet Take 1 tablet by mouth every 8 (eight) hours as needed for severe pain (pain score 7-10). 08/21/23  Yes Ki Corbo, MD  B Complex Vitamins (VITAMIN B COMPLEX) TABS Take 1 tablet by mouth daily.    [provider]  Cholecalciferol  (VITAMIN D -3) 1000 units CAPS Take 2,000 Units by mouth daily.     [provider]  Cranberry 400 MG CAPS Take 400-1,200 mg by mouth every morning.     [provider]  docusate sodium  (COLACE) 100 MG capsule Take 1 capsule (100 mg  total) by mouth 2 (two) times daily. 09/09/22   Rai, Hurman Maiden, MD  HYDROcodone -acetaminophen  (NORCO/VICODIN) 5-325 MG tablet Take 1-2 tablets by mouth every 6 (six) hours as needed for moderate pain (pain score 4-6). 06/09/23   Bronson Canny, PA-C  MAGNESIUM CITRATE PO Take 75 mg by mouth daily.    [provider]  melatonin 3 MG TABS tablet Take 1 tablet (3 mg total) by mouth at bedtime. 05/15/22   Samtani, Jai-Gurmukh, MD  Menaquinone-7 (VITAMIN K2 PO) Take 120-240 mg by mouth daily. Combo w/ Vit. D    [provider]  NON FORMULARY Take 1 capsule by mouth daily. Acetyl L carnitine    [provider]  ondansetron  (ZOFRAN ) 4 MG tablet Take 1 tablet (4 mg total) by mouth every 6 (six) hours as needed for nausea or vomiting. 09/09/22   Rai, Hurman Maiden, MD  ondansetron  (ZOFRAN -ODT) 4 MG disintegrating tablet Take 1 tablet (4 mg total) by mouth every 8 (eight) hours as needed for nausea or vomiting. 08/20/23   Bronson Canny, PA-C  OVER THE COUNTER MEDICATION Take 3 capsules by mouth 2 (two) times daily. Lion's mane 0.5 gram/capsule    [provider]  OVER THE COUNTER MEDICATION Take 2 capsules by mouth every morning. Reparagen supplement    [provider]  Phosphatidylserine 100 MG CAPS Take 100 mg by mouth every morning.  [provider]  polyethylene glycol (MIRALAX  / GLYCOLAX ) 17 g packet Take 17 g by mouth daily as needed for mild constipation or moderate constipation. 09/09/22   Rai, Hurman Maiden, MD  Probiotic Product (PROBIOTIC DAILY PO) Take 1 capsule by mouth daily.     [provider]  Propylene Glycol (SYSTANE BALANCE) 0.6 % SOLN Apply 1 drop to eye daily as needed (dry eyes).     [provider]  senna-docusate (SENOKOT-S) 8.6-50 MG tablet Take 1 tablet by mouth at bedtime. 05/15/22   Samtani, Jai-Gurmukh, MD  sildenafil (REVATIO) 20 MG tablet Take 20 mg by mouth as needed.    [provider]  sodium  chloride (MURO 128) 5 % ophthalmic ointment Place 1 application into both eyes at bedtime.     [provider]  UNABLE TO FIND Med Name: cocovia memory    [provider]  vitamin C  (ASCORBIC ACID ) 250 MG tablet Take 250 mg by mouth daily.    [provider]  zinc  sulfate 220 (50 Zn) MG capsule Take 1 capsule (220 mg total) by mouth daily. 05/15/22   Samtani, Jai-Gurmukh, MD      Allergies    Ciprofloxacin, Flagyl [metronidazole], Metoclopramide  hcl, Other, Propofol , Risperidone, Silodosin, Soy allergy (obsolete), and Sulfamethoxazole-trimethoprim    Review of Systems   Review of Systems  All other systems reviewed and are negative.   Physical Exam Updated Vital Signs BP 113/64   Pulse 66   Temp 97.8 F (36.6 C)   Resp 18   Ht 5\' 10"  (1.778 m)   Wt 51.7 kg   SpO2 100%   BMI 16.36 kg/m  Physical Exam Vitals and nursing note reviewed.  Constitutional:      Appearance: He is well-developed.  HENT:     Head: Atraumatic.  Cardiovascular:     Rate and Rhythm: Normal rate.  Pulmonary:     Effort: Pulmonary effort is normal.  Musculoskeletal:        General: Tenderness present.     Cervical back: Neck supple.     Comments: Patient has tenderness with palpation of the left hip.  There is no external rotation or shortening noted  Skin:    General: Skin is warm.  Neurological:     Mental Status: He is alert and oriented to person, place, and time.     ED Results / Procedures / Treatments   Labs (all labs ordered are listed, but only abnormal results are displayed) Labs Reviewed - No data to display  EKG None  Radiology DG Hip Unilat W or Wo Pelvis 2-3 Views Left Result Date: 08/21/2023 CLINICAL DATA:  Severe left hip pain following a fall. EXAM: DG HIP (WITH OR WITHOUT PELVIS) 2-3V LEFT COMPARISON:  08/14/2023 and left hip CT dated 08/19/2023. FINDINGS: Interval fracture in the left greater trochanter without visible medial extension. Stable  right hip prosthesis. No dislocation. IMPRESSION: Interval fracture of the left greater trochanter. Electronically Signed   By: Catherin Closs M.D.   On: 08/21/2023 10:48    Procedures Procedures    Medications Ordered in ED Medications  oxyCODONE  (Oxy IR/ROXICODONE ) immediate release tablet 5 mg (has no administration in time range)  oxyCODONE  (Oxy IR/ROXICODONE ) immediate release tablet 5 mg (5 mg Oral Given 08/21/23 0811)  ondansetron  (ZOFRAN -ODT) disintegrating tablet 4 mg (4 mg Oral Given 08/21/23 1610)    ED Course/ Medical Decision Making/ A&P  Medical Decision Making Amount and/or Complexity of Data Reviewed Radiology: ordered.  Risk OTC drugs. Prescription drug management.  This patient presents to the ED with chief complaint(s) of left hip pain with pertinent past medical history of recent diagnosis of minimally displaced greater trochanteric fracture of the left hip, that is being medically managed.The complaint involves an extensive differential diagnosis and also carries with it a high risk of complications and morbidity.    The differential diagnosis includes : Contusion, worsening of the fracture, hematoma, soft tissue injury  The initial plan is to get repeat x-ray. If the x-ray is reassuring, I do not think a CT scan is needed.  Patient already has established diagnosis of greater trochanteric fracture that is being medically managed.  He had an x-ray at that time which was reassuring.  If the repeat x-ray looks fundamentally unchanged, then CT would not change the management.   Additional history obtained: Additional history obtained from patient's caregiver Records reviewed orthopedic surgery visit, recent CT hip   Independent visualization and interpretation of imaging: - I independently visualized the following imaging with scope of interpretation limited to determining acute life threatening conditions related to emergency  care: X-ray of the hip, which revealed greater trochanteric fracture of the left femur  Treatment and Reassessment: Results of the ED workup discussed with the patient.  He is aware, that we will touch base with orthopedic surgery before we can give him definitive plan.  Consultation: - Consulted or discussed management/test interpretation with external professional: I spoke with Steffanie Edouard, orthopedic surgery.  We were unable to reach Dr. Lucienne Ryder through the paging service per secretary.  Izrael Peak has reviewed the x-ray.  He states that the patient can still be managed nonoperatively, he can be seen in the clinic in 2 weeks.   Patient and the caregiver made aware of this recommendation.  linical Impression(s) / ED Diagnoses Final diagnoses:  Closed nondisplaced fracture of greater trochanter of left femur, initial encounter (HCC)    Rx / DC Orders ED Discharge Orders          Ordered    oxyCODONE -acetaminophen  (PERCOCET/ROXICET) 5-325 MG tablet  Every 8 hours PRN        08/21/23 1238    acetaminophen  (TYLENOL ) 500 MG tablet  Every 6 hours PRN        08/21/23 1240    lidocaine  4 %  2 times daily        08/21/23 1240              Deatra Face, MD 08/21/23 1244

## 2023-08-25 ENCOUNTER — Other Ambulatory Visit: Payer: Self-pay | Admitting: Orthopaedic Surgery

## 2023-08-25 ENCOUNTER — Telehealth: Payer: Self-pay | Admitting: Orthopaedic Surgery

## 2023-08-25 ENCOUNTER — Telehealth: Payer: Self-pay | Admitting: Physician Assistant

## 2023-08-25 MED ORDER — HYDROCODONE-ACETAMINOPHEN 5-325 MG PO TABS: 1.0000 | ORAL_TABLET | Freq: Four times a day (QID) | ORAL | 0 refills | Status: DC | PRN

## 2023-08-25 NOTE — Telephone Encounter (Signed)
 Pt. Fractured hip and is asking for new order to facility with larger staff who can assist with MRI while having injury call back Pts. Assistant Adolm Hooks [phone]

## 2023-08-25 NOTE — Telephone Encounter (Signed)
 Pt has a fracture in the LT hip area and he was prescribed his oxycodone  which his aid stated is not working for him. Pt aid is offering to exchange the oxycodone  for hydrocodone . Aid also stated do not call pt telephone number. Call Lourdes Roy [phone] his assistant.

## 2023-08-25 NOTE — Telephone Encounter (Signed)
 Called and spoke with patients assistant.

## 2023-08-25 NOTE — Telephone Encounter (Signed)
 Spoke with patient, will wait till fracture is healed and then reschedule. Thanked me for calling

## 2023-08-29 ENCOUNTER — Other Ambulatory Visit

## 2023-09-03 ENCOUNTER — Other Ambulatory Visit

## 2023-09-03 ENCOUNTER — Ambulatory Visit (INDEPENDENT_AMBULATORY_CARE_PROVIDER_SITE_OTHER): Admitting: Physician Assistant

## 2023-09-03 ENCOUNTER — Other Ambulatory Visit (INDEPENDENT_AMBULATORY_CARE_PROVIDER_SITE_OTHER): Payer: Self-pay

## 2023-09-03 ENCOUNTER — Encounter: Payer: Self-pay | Admitting: Physician Assistant

## 2023-09-03 DIAGNOSIS — M25552 Pain in left hip: Secondary | ICD-10-CM

## 2023-09-03 DIAGNOSIS — S72102D Unspecified trochanteric fracture of left femur, subsequent encounter for closed fracture with routine healing: Secondary | ICD-10-CM | POA: Diagnosis not present

## 2023-09-03 NOTE — Progress Notes (Signed)
 HPI: Mr. Taves returns today for follow-up of his left greater trochanteric fracture.  He presents today in a wheelchair.  He is taking hydrocodone  for pain.  Asking for refill.  Patient did have a fall and had to be seen in the ER where radiographs were obtained I personally reviewed these and showed no change in the left greater trochanteric fracture.  Physical exam: General: No acute distress Left hip: Gentle internal/external rotation causes no significant pain.  No abduction performed.  Left calf supple nontender dorsiflexion plantarflexion left ankle intact.  Radiographs: AP pelvis lateral view left hip: Shows the left greater trochanteric fracture to remain overall good position alignment.  No new significant interval healing.  Impression: Left hip greater trochanteric fracture nondisplaced.  Plan: Weight-bear as tolerated with a walker.  He is able to ambulate for exercise.  No abduction of the left hip.  This is explained to the caregivers and are present today.  Will see him back in 1 month for AP pelvis and lateral view of the left hip.  Questions were encouraged and answered at length.  Refill on hydrocodone  was given.

## 2023-09-04 ENCOUNTER — Other Ambulatory Visit: Payer: Self-pay

## 2023-09-04 ENCOUNTER — Ambulatory Visit: Admitting: Physical Therapy

## 2023-09-04 DIAGNOSIS — R296 Repeated falls: Secondary | ICD-10-CM

## 2023-09-04 DIAGNOSIS — M25552 Pain in left hip: Secondary | ICD-10-CM

## 2023-09-04 DIAGNOSIS — M6281 Muscle weakness (generalized): Secondary | ICD-10-CM | POA: Diagnosis not present

## 2023-09-04 DIAGNOSIS — R262 Difficulty in walking, not elsewhere classified: Secondary | ICD-10-CM

## 2023-09-04 DIAGNOSIS — M25551 Pain in right hip: Secondary | ICD-10-CM | POA: Diagnosis not present

## 2023-09-04 DIAGNOSIS — R2681 Unsteadiness on feet: Secondary | ICD-10-CM

## 2023-09-04 NOTE — Therapy (Signed)
 OUTPATIENT PHYSICAL THERAPY LOWER EXTREMITY EVALUATION   Patient Name: PARDEEP PAUTZ MRN: [id number] DOB:09/10/1940, 83 y.o., male Today's Date: 09/04/2023  END OF SESSION:  PT End of Session - 09/04/23 1428     Visit Number 1    Authorization Type Medicare    PT Start Time 1429    PT Stop Time 1510    PT Time Calculation (min) 41 min    Activity Tolerance Patient tolerated treatment well    Behavior During Therapy WFL for tasks assessed/performed             Past Medical History:  Diagnosis Date   Anxiety    Arthritis    Bladder calculus    BPH (benign prostatic hyperplasia)    Chronic constipation    Complication of anesthesia    " I had some coughing afterwards for a couple of days"--  per pt "perfers spinal anesthesia since general anesthesia congnitive issues when older"   Diverticulosis of colon    Dry eye syndrome of both eyes    Environmental allergies    GERD (gastroesophageal reflux disease)    occasional   History of adenomatous polyp of colon    08/ 2004   History of kidney stones    History of squamous cell carcinoma in situ (SCCIS) of skin    s/p  excision 2013 facial areas and 06/ 2016 nose   Migraine    eye migraine occasional   Seasonal and perennial allergic rhinitis    Thrombocytopenia (HCC)    Tingling    hands and feet bilat , intermittantly-- per pt has lumbar bulging disk   Urinary frequency    Vocal fold atrophy    dysphonia-- per pt has to drink large amount of water  to take even on pill   Wears glasses    Past Surgical History:  Procedure Laterality Date   APPENDECTOMY  child   CARDIOVASCULAR STRESS TEST  05-17-2015  dr hilty   Low risk nuclear study w/ no ischemia/  normal LV function and wall motion , stress ef 54% (lvef 45-54%)   CHOLECYSTECTOMY N/A 09/29/2014   Procedure: LAPAROSCOPIC CHOLECYSTECTOMY WITH INTRAOPERATIVE CHOLANGIOGRAM;  Surgeon: Juanita Norlander, MD;  Location: [facility];  Service: General;  Laterality: N/A;    COLONOSCOPY  last one 09-06-2010   ESOPHAGOGASTRODUODENOSCOPY N/A 12/09/2013   Procedure: ESOPHAGOGASTRODUODENOSCOPY (EGD);  Surgeon: Evangeline Hilts, MD;  Location: [facility];  Service: Endoscopy;  Laterality: N/A;   EXTRACORPOREAL SHOCK WAVE LITHOTRIPSY  yrs ago   INGUINAL HERNIA REPAIR Left child   inguinal hernia repair  09/2017   NISSEN FUNDOPLICATION  1980's   open   STONE EXTRACTION WITH BASKET N/A 08/18/2016   Procedure: STONE EXTRACTION WITH BASKET;  Surgeon: Annamarie Kid, MD;  Location: [facility];  Service: Urology;  Laterality: N/A;   THULIUM LASER TURP (TRANSURETHRAL RESECTION OF PROSTATE) N/A 08/18/2016   Procedure: THULIUM LASER BLADDER NECK INCISION AND BLADDER STONE REMOVAL;  Surgeon: Annamarie Kid, MD;  Location[facility];  Service: Urology;  Laterality: N/A;   TONSILLECTOMY  child   TOTAL HIP ARTHROPLASTY Right 09/07/2022   Procedure: TOTAL HIP ARTHROPLASTY ANTERIOR APPROACH;  Surgeon: Arnie Lao, MD;  Location: [facility];  Service: Orthopedics;  Laterality: Right;   TRANSTHORACIC ECHOCARDIOGRAM  11/18/2010   grade 1 diastolic dysfunction, ef 55-60%/  trivial MR and TR/ mild dilated RA   Patient Active Problem List   Diagnosis Date Noted   Status post total replacement of right  hip 09/22/2022   Protein-calorie malnutrition, severe 09/08/2022   Hip fracture (HCC) 09/07/2022   BPH (benign prostatic hyperplasia) 09/07/2022   Closed fracture of right femur, unspecified fracture morphology, initial encounter (HCC) 09/07/2022   Fall 09/07/2022   Closed subcapital fracture of neck of femur, right, initial encounter (HCC) 09/07/2022   Memory impairment 08/06/2022   Pneumonia due to COVID-19 virus 05/06/2022   Severe sepsis (HCC) 05/06/2022   Acute hypoxemic respiratory failure (HCC) 05/06/2022   NPH (normal pressure hydrocephalus) (HCC) 05/06/2022   Physical deconditioning 05/06/2022   Thrombocytopenia (HCC) 05/06/2022    Dementia (HCC) 05/06/2022   SBO (small bowel obstruction) (HCC) 12/31/2019   Amaurosis fugax of right eye 08/25/2017   PSVT (paroxysmal supraventricular tachycardia) (HCC) 09/12/2015   Chest pain 05/03/2015   Dyspnea 05/03/2015   ALLERGIC RHINITIS 09/21/2007   G E R D 09/21/2007   SMOKE INHALATION 09/21/2007   OSTEOPOROSIS 01/28/2007   PALPITATIONS 01/28/2007   COUGH 01/28/2007   ALLERGY 01/28/2007    PCP: Lonzie Robins, MD  REFERRING PROVIDER: Bronson Canny, PA-C  REFERRING DIAG:  [phone] (ICD-10-CM) - Closed subcapital fracture of neck of femur, right, initial encounter (HCC)  M25.551 (ICD-10-CM) - Pain in right hip    THERAPY DIAG:  Difficulty in walking, not elsewhere classified  Muscle weakness (generalized)  Unsteadiness on feet  Repeated falls  Pain of both hip joints  Rationale for Evaluation and Treatment: Rehabilitation  ONSET DATE: ~1 month ago  SUBJECTIVE:   SUBJECTIVE STATEMENT: Pt comes in with his caregiver. L hip was fractured ~1 month ago. Has now been cleared for WBAT. Pt has 24/7 care at home. Pt was a two person assist but now can stand with one caregiver. Caregiver states he is now able to take 3 steps. Was able to do 30-50% of his own dressing but now only doing about 10% (upper body dressing). Recommended by ortho to do walking. Before was able to do stairs and was using a rollator prior to his L hip fracture.   PERTINENT HISTORY:  R THA June 2024, Hx of dementia, anxiety, OA, osteoporosis, BPH, skin cancer, PSVT, chronic thrombocytopenia, shingles,   PAIN:  Are you having pain? Yes: NPRS scale: R hip buttock 4-5/10, L hip 0/10; at worst 8/10 Pain location: posterior hips Pain description: aching at rest but can be sharp  Aggravating factors: Sometimes getting out of bed, sitting to standing Relieving factors: Heat, pain medication   PRECAUTIONS: From ortho H&P: Weight-bear as tolerated with a walker. He is able to ambulate for  exercise. No abduction of the left hip.  RED FLAGS: None   WEIGHT BEARING RESTRICTIONS: WBAT  FALLS:  Has patient fallen in last 6 months? Yes. Number of falls 2 -- At [employer] and lost balance while walking ~1 month ago  LIVING ENVIRONMENT: Lives with: lives with their spouse Lives in: House/apartment Stairs: Ramped and level Has following equipment at home: Environmental consultant - 2 wheeled, Wheelchair (manual), and urinal and BSC, grab bars in bathroom but currently not using shower  OCCUPATION: Retired  PLOF: Independent with household mobility with device and Needs assistance with ADLs  PATIENT GOALS: Return to walking and get in/out of chair with less pain, stand  NEXT MD VISIT: n/a  OBJECTIVE:  Note: Objective measures were completed at Evaluation unless otherwise noted.  DIAGNOSTIC FINDINGS: 1. Minimally displaced fracture through the base of the greater trochanter. No intertrochanteric extension is identified but fractures can be occult on CT, particularly in the  elderly. If there is concern for further fracture, MRI without contrast is the best test for evaluation. 2. Mild left hip osteoarthritis. 3. Large stool ball in the rectosigmoid colon.  PATIENT SURVEYS:  The Patient-Specific Functional Scale  Initial:  I am going to ask you to identify up to 3 important activities that you are unable to do or are having difficulty with as a result of this problem.  Today are there any activities that you are unable to do or having difficulty with because of this?  (Patient shown scale and patient rated each activity)  Follow up: When you first came in you had difficulty performing these activities.  Today do you still have difficulty?  Patient-Specific activity scoring scheme (Point to one number):  0 1 2 3 4 5 6 7 8 9  10 Unable                                                                                                          Able to perform To perform                                                                                                     activity at the same Activity         Level as before                                                                                                                       Injury or problem Activity Initial (eval): Follow up:  Standing 4   2.   Walking  2   3.   Transfers 6   4.   Stairs 0   AVERAGE 3      COGNITION: Overall cognitive status: Within functional limits for tasks assessed     SENSATION: WFL  EDEMA:  Mild edema in R>L  MUSCLE LENGTH: Hamstrings: see knee ROM Thomas test: limited bilat  POSTURE: increased thoracic kyphosis  PALPATION: Did not assess  LOWER EXTREMITY ROM:  Active ROM Right eval Left eval  Hip flexion    Hip extension    Hip abduction    Hip  adduction    Hip internal rotation    Hip external rotation    Knee flexion    Knee extension -15 -30  Ankle dorsiflexion    Ankle plantarflexion    Ankle inversion    Ankle eversion     (Blank rows = not tested)  LOWER EXTREMITY MMT:  MMT Right eval Left eval  Hip flexion 4* 4*  Hip extension    Hip abduction 3+ Did not assess due to precautions  Hip adduction 4+ 4  Hip internal rotation    Hip external rotation    Knee flexion 4+ 4+  Knee extension 4- * 4- pulls in back  Ankle dorsiflexion 4 4  Ankle plantarflexion 4- 4-  Ankle inversion    Ankle eversion     (Blank rows = not tested, * = pain)  TRANSFERS:   Sit<>stand: min A with RW for stability, pt using UEs to come to full standing  Stand pivot t/f: CGA with RW  Sit<>sup: min A for L LE management  FUNCTIONAL TESTS:  TUG: 2 min 2 sec  GAIT: Distance walked: 10' Assistive device utilized: Environmental consultant - 2 wheeled Level of assistance: CGA Comments: Antalgic, step to pattern leading with L LE, very diminished bilat                                                                                                                                 TREATMENT  DATE: 09/04/23 See HEP below    PATIENT EDUCATION:  Education details: Exam findings, POC, initial HEP, importance of weightbearing Person educated: Patient and Caregiver   Education method: Explanation, Demonstration, Verbal cues, and Handouts Education comprehension: verbalized understanding, returned demonstration, and needs further education  HOME EXERCISE PROGRAM: Access Code: [email or web address] URL: [email or web address] Date: 09/04/2023 Prepared by: Lewayne Pauley April Erman [place]  Exercises - Supine Heel Slide  - 1 x daily - 7 x weekly - 1 sets - 10 reps - Small Range Straight Leg Raise  - 1 x daily - 7 x weekly - 1 sets - 10 reps - Proper Sit to Stand Technique  - 10 x daily - 7 x weekly - 1 sets - 2-3 reps - Supine Bridge with Resistance Band  - 1 x daily - 7 x weekly - 1 sets - 10 reps  ASSESSMENT:  CLINICAL IMPRESSION: Patient is a 83 y.o. M who was seen today for physical therapy evaluation and treatment for L greater trochanter fx (non-op). PMH significant for history of falls and R hip fx s/p R THA in June 2024. Current precautions is WBAT and no hip abduction. Pt has caregivers at home and wife still works. Assessment demos very taut hamstrings and quads/hip flexors, with gross bilat LE weakness and decreased balance/standing tolerance. L lateral knee/thigh pain limits amb. TUG indicates a high fall risk. Pt will benefit from PT to maximize his mobility.   OBJECTIVE IMPAIRMENTS: Abnormal gait, decreased activity tolerance, decreased balance,  decreased coordination, decreased endurance, decreased mobility, difficulty walking, decreased ROM, decreased strength, hypomobility, increased fascial restrictions, impaired flexibility, improper body mechanics, postural dysfunction, and pain.   ACTIVITY LIMITATIONS: carrying, lifting, bending, standing, squatting, stairs, transfers, bed mobility, bathing, toileting, dressing, hygiene/grooming, and locomotion level  PARTICIPATION  LIMITATIONS: cleaning, interpersonal relationship, and community activity  PERSONAL FACTORS: Age, Fitness, Past/current experiences, Time since onset of injury/illness/exacerbation, and 1 comorbidity: R THA are also affecting patient's functional outcome.   REHAB POTENTIAL: Good  CLINICAL DECISION MAKING: Evolving/moderate complexity  EVALUATION COMPLEXITY: Moderate   GOALS: Goals reviewed with patient? Yes  SHORT TERM GOALS: Target date: 10/09/2023  Pt will be ind with initial HEP Baseline: Goal status: INITIAL  2.  Pt will be able to perform 5x STS without assist Baseline:  Goal status: INITIAL  3.  Pt will be able to amb at least 50' with RW SBA for home mobility Baseline:  Goal status: INITIAL   LONG TERM GOALS: Target date: 11/13/2023   Pt will be ind with management and progression of HEP Baseline:  Goal status: INITIAL  2.  Pt will improve TUG to </=1'30" to demo improved gait pattern Baseline:  Goal status: INITIAL  3.  Pt will be able to amb at least 150' with RW SBA for safe home amb Baseline:  Goal status: INITIAL  4.  Pt will have improved PSFS average score to >/=5 to demo MCID Baseline:  Goal status: INITIAL  5.  Pt will be able to perform all transfers mod I for home to reduce caregiver burden Baseline:  Goal status: INITIAL  PLAN:  PT FREQUENCY: 2x/week  PT DURATION: 10 weeks  PLANNED INTERVENTIONS: 97164- PT Re-evaluation, 97750- Physical Performance Testing, 97110-Therapeutic exercises, 97530- Therapeutic activity, [id number]- Neuromuscular re-education, 97535- Self Care, 16109- Manual therapy, [id number]- Gait training, [phone]- Aquatic Therapy, [phone]- Electrical stimulation (unattended), 97016- Vasopneumatic device, [id number]- Ultrasound, [id number]- Ionotophoresis 4mg /ml Dexamethasone , 91478 (1-2 muscles), 20561 (3+ muscles)- Dry Needling, Patient/Family education, Balance training, Stair training, Taping, Joint mobilization, Cryotherapy, and Moist heat  PLAN  FOR NEXT SESSION: Assess response to HEP. Work on ambulating and weightbearing with RW. Continue gross bilat LE strengthening.    Autry Prust April Ma L Mahoganie Basher, PT, DPT 09/04/2023, 4:08 PM

## 2023-09-14 ENCOUNTER — Telehealth: Payer: Self-pay | Admitting: Rehabilitative and Restorative Service Providers"

## 2023-09-14 NOTE — Telephone Encounter (Signed)
 Called to offer early appointment time.  Pt was reported to be on another phone call and would call back.    Bonna Bustard, PT, DPT, OCS, ATC 09/14/23  3:32 PM

## 2023-09-22 ENCOUNTER — Encounter: Payer: Self-pay | Admitting: Rehabilitative and Restorative Service Providers"

## 2023-09-22 ENCOUNTER — Ambulatory Visit (INDEPENDENT_AMBULATORY_CARE_PROVIDER_SITE_OTHER): Admitting: Rehabilitative and Restorative Service Providers"

## 2023-09-22 DIAGNOSIS — R296 Repeated falls: Secondary | ICD-10-CM

## 2023-09-22 DIAGNOSIS — M6281 Muscle weakness (generalized): Secondary | ICD-10-CM

## 2023-09-22 DIAGNOSIS — M25552 Pain in left hip: Secondary | ICD-10-CM

## 2023-09-22 DIAGNOSIS — R2681 Unsteadiness on feet: Secondary | ICD-10-CM | POA: Diagnosis not present

## 2023-09-22 DIAGNOSIS — R262 Difficulty in walking, not elsewhere classified: Secondary | ICD-10-CM

## 2023-09-22 DIAGNOSIS — M25551 Pain in right hip: Secondary | ICD-10-CM

## 2023-09-22 NOTE — Therapy (Signed)
 OUTPATIENT PHYSICAL THERAPY TREATMENT   Patient Name: Lee Nelson MRN: [id number] DOB:07-15-1940, 83 y.o., male Today's Date: 09/22/2023  END OF SESSION:  PT End of Session - 09/22/23 0933     Visit Number 2    Number of Visits 20    Date for PT Re-Evaluation 11/13/23    Authorization Type Medicare    Progress Note Due on Visit 10    PT Start Time 0926    PT Stop Time 1006    PT Time Calculation (min) 40 min    Activity Tolerance Patient limited by fatigue    Behavior During Therapy [facility] for tasks assessed/performed           Past Medical History:  Diagnosis Date   Anxiety    Arthritis    Bladder calculus    BPH (benign prostatic hyperplasia)    Chronic constipation    Complication of anesthesia     I had some coughing afterwards for a couple of days--  per pt perfers spinal anesthesia since general anesthesia congnitive issues when older   Diverticulosis of colon    Dry eye syndrome of both eyes    Environmental allergies    GERD (gastroesophageal reflux disease)    occasional   History of adenomatous polyp of colon    08/ 2004   History of kidney stones    History of squamous cell carcinoma in situ (SCCIS) of skin    s/p  excision 2013 facial areas and 06/ 2016 nose   Migraine    eye migraine occasional   Seasonal and perennial allergic rhinitis    Thrombocytopenia (HCC)    Tingling    hands and feet bilat , intermittantly-- per pt has lumbar bulging disk   Urinary frequency    Vocal fold atrophy    dysphonia-- per pt has to drink large amount of water  to take even on pill   Wears glasses    Past Surgical History:  Procedure Laterality Date   APPENDECTOMY  child   CARDIOVASCULAR STRESS TEST  05-17-2015  dr hilty   Low risk nuclear study w/ no ischemia/  normal LV function and wall motion , stress ef 54% (lvef 45-54%)   CHOLECYSTECTOMY N/A 09/29/2014   Procedure: LAPAROSCOPIC CHOLECYSTECTOMY WITH INTRAOPERATIVE CHOLANGIOGRAM;  Surgeon: Alm Angle, MD;  Location: [facility];  Service: General;  Laterality: N/A;   COLONOSCOPY  last one 09-06-2010   ESOPHAGOGASTRODUODENOSCOPY N/A 12/09/2013   Procedure: ESOPHAGOGASTRODUODENOSCOPY (EGD);  Surgeon: Elsie Cree, MD;  [facility] END[facility]COPY;  Service: Endoscopy;  Laterality: N/A;   EXTRACORPOREAL SHOCK WAVE LITHOTRIPSY  yrs ago   INGUINAL HERNIA REPAIR Left child   inguinal hernia repair  09/2017   NISSEN FUNDOPLICATION  1980's   open   STONE EXTRACTION WITH BASKET N/A 08/18/2016   Procedure: STONE EXTRACTION WITH BASKET;  Surgeon: Chales Idol, MD;  Location: [facility];  Service: Urology;  Laterality: N/A;   THULIUM LASER TURP (TRANSURETHRAL RESECTION OF PROSTATE) N/A 08/18/2016   Procedure: THULIUM LASER BLADDER NECK INCISION AND BLADDER STONE REMOVAL;  Surgeon: Chales Idol, MD;  Location: [facility];  Service: Urology;  Laterality: N/A;   TONSILLECTOMY  child   TOTAL HIP ARTHROPLASTY Right 09/07/2022   Procedure: TOTAL HIP ARTHROPLASTY ANTERIOR APPROACH;  Surgeon: Vernetta Lonni GRADE, MD;  Location: [facility];  Service: Orthopedics;  Laterality: Right;   TRANSTHORACIC ECHOCARDIOGRAM  11/18/2010   grade 1 diastolic dysfunction, ef 55-60%/  trivial MR and TR/ mild dilated  RA   Patient Active Problem List   Diagnosis Date Noted   Status post total replacement of right hip 09/22/2022   Protein-calorie malnutrition, severe 09/08/2022   Hip fracture (HCC) 09/07/2022   BPH (benign prostatic hyperplasia) 09/07/2022   Closed fracture of right femur, unspecified fracture morphology, initial encounter (HCC) 09/07/2022   Fall 09/07/2022   Closed subcapital fracture of neck of femur, right, initial encounter (HCC) 09/07/2022   Memory impairment 08/06/2022   Pneumonia due to COVID-19 virus 05/06/2022   Severe sepsis (HCC) 05/06/2022   Acute hypoxemic respiratory failure (HCC) 05/06/2022   NPH (normal pressure hydrocephalus) (HCC) 05/06/2022    Physical deconditioning 05/06/2022   Thrombocytopenia (HCC) 05/06/2022   Dementia (HCC) 05/06/2022   SBO (small bowel obstruction) (HCC) 12/31/2019   Amaurosis fugax of right eye 08/25/2017   PSVT (paroxysmal supraventricular tachycardia) (HCC) 09/12/2015   Chest pain 05/03/2015   Dyspnea 05/03/2015   ALLERGIC RHINITIS 09/21/2007   G E R D 09/21/2007   SMOKE INHALATION 09/21/2007   OSTEOPOROSIS 01/28/2007   PALPITATIONS 01/28/2007   COUGH 01/28/2007   ALLERGY 01/28/2007    PCP: Janey Santos, MD  REFERRING PROVIDER: Gretta Bertrum ORN, PA-C  REFERRING DIAG:  [phone] (ICD-10-CM) - Closed subcapital fracture of neck of femur, right, initial encounter (HCC)  M25.551 (ICD-10-CM) - Pain in right hip    THERAPY DIAG:  Difficulty in walking, not elsewhere classified  Muscle weakness (generalized)  Unsteadiness on feet  Repeated falls  Pain of both hip joints  Rationale for Evaluation and Treatment: Rehabilitation  ONSET DATE: ~1 month ago  SUBJECTIVE:   SUBJECTIVE STATEMENT: Pt indicated having some complaints in buttock on Lt side and some pain in Lt foot where something hit it.   PERTINENT HISTORY:  R THA June 2024, Hx of dementia, anxiety, OA, osteoporosis, BPH, skin cancer, PSVT, chronic thrombocytopenia, shingles,   PAIN:  PRS scale: 1.5/10 Pain location: posterior hips bilateral  Pain description: aching at rest but can be sharp  Aggravating factors: Sometimes getting out of bed, sitting to standing Relieving factors: Heat, pain medication   PRECAUTIONS: From ortho H&P: Weight-bear as tolerated with a walker. He is able to ambulate for exercise. No abduction of the left hip.  RED FLAGS: None   WEIGHT BEARING RESTRICTIONS: WBAT  FALLS:  Has patient fallen in last 6 months? Yes. Number of falls 2 -- At [employer] and lost balance while walking ~1 month ago  LIVING ENVIRONMENT: Lives with: lives with their spouse Lives in: House/apartment Stairs:  Ramped and level Has following equipment at home: Environmental consultant - 2 wheeled, Wheelchair (manual), and urinal and BSC, grab bars in bathroom but currently not using shower  OCCUPATION: Retired  PLOF: Independent with household mobility with device and Needs assistance with ADLs  PATIENT GOALS: Return to walking and get in/out of chair with less pain, stand  NEXT MD VISIT: n/a  OBJECTIVE:  Note: Objective measures were completed at Evaluation unless otherwise noted.  DIAGNOSTIC FINDINGS: 1. Minimally displaced fracture through the base of the greater trochanter. No intertrochanteric extension is identified but fractures can be occult on CT, particularly in the elderly. If there is concern for further fracture, MRI without contrast is the best test for evaluation. 2. Mild left hip osteoarthritis. 3. Large stool ball in the rectosigmoid colon.  PATIENT SURVEYS:  The Patient-Specific Functional Scale  0 1 2 3 4 5 6 7 8 9  10 Unable  Able to perform To perform                                                                                                    activity at the same Activity         Level as before                                                                                                                       Injury or problem Activity Initial (eval): 09/04/2023 Follow up:  Standing 4   2.   Walking  2   3.   Transfers 6   4.   Stairs 0   AVERAGE 3      COGNITION: 09/04/2023 Overall cognitive status: Within functional limits for tasks assessed     SENSATION: 09/04/2023 [facility]  EDEMA:  09/04/2023 Mild edema in R>L  MUSCLE LENGTH: 09/04/2023 Hamstrings: see knee ROM Thomas test: limited bilat  POSTURE: 09/04/2023  increased thoracic kyphosis  PALPATION: 09/04/2023 Did not assess  LOWER EXTREMITY ROM:  Active ROM Right Eval 09/04/2023 Left Eval 09/04/2023  Hip  flexion    Hip extension    Hip abduction    Hip adduction    Hip internal rotation    Hip external rotation    Knee flexion    Knee extension -15 -30  Ankle dorsiflexion    Ankle plantarflexion    Ankle inversion    Ankle eversion     (Blank rows = not tested)  LOWER EXTREMITY MMT:  MMT Right Eval 09/04/2023 Left Eval 09/04/2023  Hip flexion 4* 4*  Hip extension    Hip abduction 3+ Did not assess due to precautions  Hip adduction 4+ 4  Hip internal rotation    Hip external rotation    Knee flexion 4+ 4+  Knee extension 4- * 4- pulls in back  Ankle dorsiflexion 4 4  Ankle plantarflexion 4- 4-  Ankle inversion    Ankle eversion     (Blank rows = not tested, * = pain)  TRANSFERS:  09/04/2023  Sit<>stand: min A with RW for stability, pt using UEs to come to full standing  Stand pivot t/f: CGA with RW  Sit<>sup: min A for L LE management  FUNCTIONAL TESTS:  09/04/2023 TUG: 2 min 2 sec  GAIT: 09/22/2023:  FWW ambulation c reduced step lengths, shuffling gait, forward trunk lean.  Performed < 100 ft.   09/04/2023 Distance walked: 10' Assistive device utilized: Environmental consultant - 2 wheeled Level of assistance: CGA Comments: Antalgic, step to pattern leading with L LE, very  diminished bilat                     TREATMENT        DATE: 09/22/2023 Therex: Nustep Lvl 5 8 mins UE/LE for ROM Seated yellow small ball hip adductor isometric squeeze bilateral 5 sec hold x 10  Seated isometric bilateral hip abduction 5 sec holld x 10 with use of belt around knees to prevent hip abduction range.   Neuro Re-ed (balance control, stability, postural improvements) Standing balance no UE assist 1 min c CGA to min A at times to prevent posterior loss of balance. Cues for performance.   TherActivity (to improve functional movement patterns)  Seated back supported leg lift to 6 inch cones 3 anteriorly x 5 each bilaterally to help improve foot clearance in ambulation, transfers.  Standing with FWW  with leg lift to 6 inch cones 3 anteriorly x 3 each bilaterally with CGA to help improve foot clearance in ambulation and transfers.   Transfer education and performance with wheelchair to chair transfer to Rt with cues for hand placement, use of arm rests not walker in movement pattern.  Standard sit to stand to sit transfers performed throughout intervention c cues for hand placement and movement pattern cues to reduce use of walker in movement for safety.    Gait Training FWW with CGA with consistent verbal cues throughout with visual cues of colored bands on walker for foot placement.  Focused on improvement in walker placement for safety as well as increasing step length.  Initial ambulation with step to gait pattern then progressed into step through.  Performed 100 ft, 30 ft.    TREATMENT        DATE: 09/04/23 See HEP below    PATIENT EDUCATION:  Education details: Exam findings, POC, initial HEP, importance of weightbearing Person educated: Patient and Caregiver   Education method: Explanation, Demonstration, Verbal cues, and Handouts Education comprehension: verbalized understanding, returned demonstration, and needs further education  HOME EXERCISE PROGRAM: Access Code: [email or web address] URL: [email or web address] Date: 09/04/2023 Prepared by: Gellen April Earnie Starring  Exercises - Supine Heel Slide  - 1 x daily - 7 x weekly - 1 sets - 10 reps - Small Range Straight Leg Raise  - 1 x daily - 7 x weekly - 1 sets - 10 reps - Proper Sit to Stand Technique  - 10 x daily - 7 x weekly - 1 sets - 2-3 reps - Supine Bridge with Resistance Band  - 1 x daily - 7 x weekly - 1 sets - 10 reps  ASSESSMENT:  CLINICAL IMPRESSION: Pt presented with muscle weakness, movement coordination and progressive mobility deficits which were present in daily mobility including transfers, ambulation.  Consistent guarding required from CGA to min/moderate A at times to help reduce fall.      OBJECTIVE IMPAIRMENTS: Abnormal gait, decreased activity tolerance, decreased balance, decreased coordination, decreased endurance, decreased mobility, difficulty walking, decreased ROM, decreased strength, hypomobility, increased fascial restrictions, impaired flexibility, improper body mechanics, postural dysfunction, and pain.   ACTIVITY LIMITATIONS: carrying, lifting, bending, standing, squatting, stairs, transfers, bed mobility, bathing, toileting, dressing, hygiene/grooming, and locomotion level  PARTICIPATION LIMITATIONS: cleaning, interpersonal relationship, and community activity  PERSONAL FACTORS: Age, Fitness, Past/current experiences, Time since onset of injury/illness/exacerbation, and 1 comorbidity: R THA are also affecting patient's functional outcome.   REHAB POTENTIAL: Good  CLINICAL DECISION MAKING: Evolving/moderate complexity  EVALUATION COMPLEXITY: Moderate   GOALS: Goals reviewed with patient? Yes  SHORT TERM GOALS:  Target date: 10/09/2023  Pt will be ind with initial HEP Baseline: Goal status: on going 09/22/2023  2.  Pt will be able to perform 5x STS without assist Baseline:  Goal status: on going 09/22/2023  3.  Pt will be able to amb at least 50' with RW SBA for home mobility Baseline:  Goal status: on going 09/22/2023   LONG TERM GOALS: Target date: 11/13/2023   Pt will be ind with management and progression of HEP Baseline:  Goal status: INITIAL  2.  Pt will improve TUG to </=1'30 to demo improved gait pattern Baseline:  Goal status: INITIAL  3.  Pt will be able to amb at least 150' with RW SBA for safe home amb Baseline:  Goal status: INITIAL  4.  Pt will have improved PSFS average score to >/=5 to demo MCID Baseline:  Goal status: INITIAL  5.  Pt will be able to perform all transfers mod I for home to reduce caregiver burden Baseline:  Goal status: INITIAL  PLAN:  PT FREQUENCY: 2x/week  PT DURATION: 10 weeks  PLANNED  INTERVENTIONS: 97164- PT Re-evaluation, 97750- Physical Performance Testing, 97110-Therapeutic exercises, 97530- Therapeutic activity, [id number]- Neuromuscular re-education, 97535- Self Care, [id number]- Manual therapy, [id number]- Gait training, [phone]- Aquatic Therapy, [phone]- Electrical stimulation (unattended), 97016- Vasopneumatic device, [id number]- Ultrasound, [id number]- Ionotophoresis 4mg /ml Dexamethasone , 79439 (1-2 muscles), [id number] (3+ muscles)- Dry Needling, Patient/Family education, Balance training, Stair training, Taping, Joint mobilization, Cryotherapy, and Moist heat  PLAN FOR NEXT SESSION: Progressive mobility, strength and balance improvements.    Ozell Silvan, PT, DPT, OCS, ATC 09/22/23  10:17 AM

## 2023-09-29 ENCOUNTER — Encounter: Payer: Self-pay | Admitting: Rehabilitative and Restorative Service Providers"

## 2023-09-29 ENCOUNTER — Ambulatory Visit (INDEPENDENT_AMBULATORY_CARE_PROVIDER_SITE_OTHER): Admitting: Rehabilitative and Restorative Service Providers"

## 2023-09-29 DIAGNOSIS — M6281 Muscle weakness (generalized): Secondary | ICD-10-CM

## 2023-09-29 DIAGNOSIS — R2681 Unsteadiness on feet: Secondary | ICD-10-CM

## 2023-09-29 DIAGNOSIS — R296 Repeated falls: Secondary | ICD-10-CM

## 2023-09-29 DIAGNOSIS — R262 Difficulty in walking, not elsewhere classified: Secondary | ICD-10-CM

## 2023-09-29 DIAGNOSIS — M25552 Pain in left hip: Secondary | ICD-10-CM | POA: Diagnosis not present

## 2023-09-29 DIAGNOSIS — M25551 Pain in right hip: Secondary | ICD-10-CM | POA: Diagnosis not present

## 2023-09-29 NOTE — Therapy (Signed)
 OUTPATIENT PHYSICAL THERAPY TREATMENT   Patient Name: Lee Nelson MRN: [id number] DOB:1940-04-28, 83 y.o., male Today's Date: 09/29/2023  END OF SESSION:  PT End of Session - 09/29/23 1605     Visit Number 3    Number of Visits 20    Date for PT Re-Evaluation 11/13/23    Authorization Type Medicare    Progress Note Due on Visit 10    PT Start Time 1557    PT Stop Time 1640    PT Time Calculation (min) 43 min    Activity Tolerance Patient limited by fatigue    Behavior During Therapy [facility] for tasks assessed/performed            Past Medical History:  Diagnosis Date   Anxiety    Arthritis    Bladder calculus    BPH (benign prostatic hyperplasia)    Chronic constipation    Complication of anesthesia     I had some coughing afterwards for a couple of days--  per pt perfers spinal anesthesia since general anesthesia congnitive issues when older   Diverticulosis of colon    Dry eye syndrome of both eyes    Environmental allergies    GERD (gastroesophageal reflux disease)    occasional   History of adenomatous polyp of colon    08/ 2004   History of kidney stones    History of squamous cell carcinoma in situ (SCCIS) of skin    s/p  excision 2013 facial areas and 06/ 2016 nose   Migraine    eye migraine occasional   Seasonal and perennial allergic rhinitis    Thrombocytopenia (HCC)    Tingling    hands and feet bilat , intermittantly-- per pt has lumbar bulging disk   Urinary frequency    Vocal fold atrophy    dysphonia-- per pt has to drink large amount of water  to take even on pill   Wears glasses    Past Surgical History:  Procedure Laterality Date   APPENDECTOMY  child   CARDIOVASCULAR STRESS TEST  05-17-2015  dr hilty   Low risk nuclear study w/ no ischemia/  normal LV function and wall motion , stress ef 54% (lvef 45-54%)   CHOLECYSTECTOMY N/A 09/29/2014   Procedure: LAPAROSCOPIC CHOLECYSTECTOMY WITH INTRAOPERATIVE CHOLANGIOGRAM;  Surgeon: Alm Angle, MD;  Location: [facility];  Service: General;  Laterality: N/A;   COLONOSCOPY  last one 09-06-2010   ESOPHAGOGASTRODUODENOSCOPY N/A 12/09/2013   Procedure: ESOPHAGOGASTRODUODENOSCOPY (EGD);  Surgeon: Elsie Cree, MD;  Location: [facility];  Service: Endoscopy;  Laterality: N/A;   EXTRACORPOREAL SHOCK WAVE LITHOTRIPSY  yrs ago   INGUINAL HERNIA REPAIR Left child   inguinal hernia repair  09/2017   NISSEN FUNDOPLICATION  1980's   open   STONE EXTRACTION WITH BASKET N/A 08/18/2016   Procedure: STONE EXTRACTION WITH BASKET;  Surgeon: Chales Idol, MD;  Location: [facility];  Service: Urology;  Laterality: N/A;   THULIUM LASER TURP (TRANSURETHRAL RESECTION OF PROSTATE) N/A 08/18/2016   Procedure: THULIUM LASER BLADDER NECK INCISION AND BLADDER STONE REMOVAL;  Surgeon: Chales Idol, MD;  Location: [facility];  Service: Urology;  Laterality: N/A;   TONSILLECTOMY  child   TOTAL HIP ARTHROPLASTY Right 09/07/2022   Procedure: TOTAL HIP ARTHROPLASTY ANTERIOR APPROACH;  Surgeon: Vernetta Lonni GRADE, MD;  Location: [facility];  Service: Orthopedics;  Laterality: Right;   TRANSTHORACIC ECHOCARDIOGRAM  11/18/2010   grade 1 diastolic dysfunction, ef 55-60%/  trivial MR and TR/ mild  dilated RA   Patient Active Problem List   Diagnosis Date Noted   Status post total replacement of right hip 09/22/2022   Protein-calorie malnutrition, severe 09/08/2022   Hip fracture (HCC) 09/07/2022   BPH (benign prostatic hyperplasia) 09/07/2022   Closed fracture of right femur, unspecified fracture morphology, initial encounter (HCC) 09/07/2022   Fall 09/07/2022   Closed subcapital fracture of neck of femur, right, initial encounter (HCC) 09/07/2022   Memory impairment 08/06/2022   Pneumonia due to COVID-19 virus 05/06/2022   Severe sepsis (HCC) 05/06/2022   Acute hypoxemic respiratory failure (HCC) 05/06/2022   NPH (normal pressure hydrocephalus) (HCC) 05/06/2022    Physical deconditioning 05/06/2022   Thrombocytopenia (HCC) 05/06/2022   Dementia (HCC) 05/06/2022   SBO (small bowel obstruction) (HCC) 12/31/2019   Amaurosis fugax of right eye 08/25/2017   PSVT (paroxysmal supraventricular tachycardia) (HCC) 09/12/2015   Chest pain 05/03/2015   Dyspnea 05/03/2015   ALLERGIC RHINITIS 09/21/2007   G E R D 09/21/2007   SMOKE INHALATION 09/21/2007   OSTEOPOROSIS 01/28/2007   PALPITATIONS 01/28/2007   COUGH 01/28/2007   ALLERGY 01/28/2007    PCP: Janey Santos, MD  REFERRING PROVIDER: Gretta Bertrum ORN, PA-C  REFERRING DIAG:  [phone] (ICD-10-CM) - Closed subcapital fracture of neck of femur, right, initial encounter (HCC)  M25.551 (ICD-10-CM) - Pain in right hip    THERAPY DIAG:  Difficulty in walking, not elsewhere classified  Muscle weakness (generalized)  Unsteadiness on feet  Repeated falls  Pain of both hip joints  Rationale for Evaluation and Treatment: Rehabilitation  ONSET DATE: ~1 month ago  SUBJECTIVE:   SUBJECTIVE STATEMENT: Pt indicated having no specific pain today, just some tired feeling.  Reported feeling ok after last visit without specific complaints.   PERTINENT HISTORY:  R THA June 2024, Hx of dementia, anxiety, OA, osteoporosis, BPH, skin cancer, PSVT, chronic thrombocytopenia, shingles,   PAIN:  PRS scale: no specific pain upon arrival today Pain location: posterior hips bilateral  Pain description: aching at rest but can be sharp  Aggravating factors: Sometimes getting out of bed, sitting to standing Relieving factors: Heat, pain medication   PRECAUTIONS: From ortho H&P: Weight-bear as tolerated with a walker. He is able to ambulate for exercise. No abduction of the left hip.  RED FLAGS: None   WEIGHT BEARING RESTRICTIONS: WBAT  FALLS:  Has patient fallen in last 6 months? Yes. Number of falls 2 -- At [employer] and lost balance while walking ~1 month ago  LIVING ENVIRONMENT: Lives with: lives  with their spouse Lives in: House/apartment Stairs: Ramped and level Has following equipment at home: Environmental consultant - 2 wheeled, Wheelchair (manual), and urinal and BSC, grab bars in bathroom but currently not using shower  OCCUPATION: Retired  PLOF: Independent with household mobility with device and Needs assistance with ADLs  PATIENT GOALS: Return to walking and get in/out of chair with less pain, stand  NEXT MD VISIT: n/a  OBJECTIVE:  Note: Objective measures were completed at Evaluation unless otherwise noted.  DIAGNOSTIC FINDINGS: 1. Minimally displaced fracture through the base of the greater trochanter. No intertrochanteric extension is identified but fractures can be occult on CT, particularly in the elderly. If there is concern for further fracture, MRI without contrast is the best test for evaluation. 2. Mild left hip osteoarthritis. 3. Large stool ball in the rectosigmoid colon.  PATIENT SURVEYS:  The Patient-Specific Functional Scale   Eval:   Standing 4   2.   Walking  2  3.   Transfers 6   4.   Stairs 0   AVERAGE 3      COGNITION: 09/04/2023 Overall cognitive status: Within functional limits for tasks assessed     SENSATION: 09/04/2023 [facility]  EDEMA:  09/04/2023 Mild edema in R>L  MUSCLE LENGTH: 09/04/2023 Hamstrings: see knee ROM Thomas test: limited bilat  POSTURE: 09/04/2023  increased thoracic kyphosis  PALPATION: 09/04/2023 Did not assess  LOWER EXTREMITY ROM:  Active ROM Right Eval 09/04/2023 Left Eval 09/04/2023  Hip flexion    Hip extension    Hip abduction    Hip adduction    Hip internal rotation    Hip external rotation    Knee flexion    Knee extension -15 -30  Ankle dorsiflexion    Ankle plantarflexion    Ankle inversion    Ankle eversion     (Blank rows = not tested)  LOWER EXTREMITY MMT:  MMT Right Eval 09/04/2023 Left Eval 09/04/2023  Hip flexion 4* 4*  Hip extension    Hip abduction 3+ Did not assess due to precautions   Hip adduction 4+ 4  Hip internal rotation    Hip external rotation    Knee flexion 4+ 4+  Knee extension 4- * 4- pulls in back  Ankle dorsiflexion 4 4  Ankle plantarflexion 4- 4-  Ankle inversion    Ankle eversion     (Blank rows = not tested, * = pain)  TRANSFERS:  09/04/2023  Sit<>stand: min A with RW for stability, pt using UEs to come to full standing  Stand pivot t/f: CGA with RW  Sit<>sup: min A for L LE management  FUNCTIONAL TESTS:  09/04/2023 TUG: 2 min 2 sec  GAIT: 09/22/2023:  FWW ambulation c reduced step lengths, shuffling gait, forward trunk lean.  Performed < 100 ft.   09/04/2023 Distance walked: 10' Assistive device utilized: Environmental consultant - 2 wheeled Level of assistance: CGA Comments: Antalgic, step to pattern leading with L LE, very diminished bilat                     TREATMENT        DATE: 09/29/2023 Therex: Nustep Lvl 5 5 mins x 2  mins UE/LE for ROM Seated marching to 6 inch cones 3 anterior x 5 each, performed bilaterally.  Seated yellow small ball hip adductor isometric squeeze bilateral 5 sec hold x 10  Seated isometric bilateral hip abduction 5 sec holld x 10 with use of belt around knees to prevent hip abduction range.   Neuro Re-ed (balance control, stability, postural improvements) BIG inspired stepping with CGA in // bars x 5 each side forward, x 6 each side with UE flexion in movement BIG inspired retro stepping with CGA in // bars x 10 bilateral    Gait Training FWW with CGA with consistent verbal cues throughout for increased foot clearing, step length.  Focused on improvement in walker placement for safety as well as increasing step length.   Performed 100 ft, 130 ft, 30 ft  TREATMENT        DATE: 09/22/2023 Therex: Nustep Lvl 5 8 mins UE/LE for ROM Seated yellow small ball hip adductor isometric squeeze bilateral 5 sec hold x 10  Seated isometric bilateral hip abduction 5 sec holld x 10 with use of belt around knees to prevent hip abduction  range.   Neuro Re-ed (balance control, stability, postural improvements) Standing balance no UE assist 1 min c CGA to min A at  times to prevent posterior loss of balance. Cues for performance.   TherActivity (to improve functional movement patterns)  Seated back supported leg lift to 6 inch cones 3 anteriorly x 5 each bilaterally to help improve foot clearance in ambulation, transfers.  Standing with FWW with leg lift to 6 inch cones 3 anteriorly x 3 each bilaterally with CGA to help improve foot clearance in ambulation and transfers.   Transfer education and performance with wheelchair to chair transfer to Rt with cues for hand placement, use of arm rests not walker in movement pattern.  Standard sit to stand to sit transfers performed throughout intervention c cues for hand placement and movement pattern cues to reduce use of walker in movement for safety.    Gait Training FWW with CGA with consistent verbal cues throughout with visual cues of colored bands on walker for foot placement.  Focused on improvement in walker placement for safety as well as increasing step length.  Initial ambulation with step to gait pattern then progressed into step through.  Performed 100 ft, 30 ft.    TREATMENT        DATE: 09/04/23 See HEP below    PATIENT EDUCATION:  Education details: Exam findings, POC, initial HEP, importance of weightbearing Person educated: Patient and Caregiver   Education method: Explanation, Demonstration, Verbal cues, and Handouts Education comprehension: verbalized understanding, returned demonstration, and needs further education  HOME EXERCISE PROGRAM: Access Code: [email or web address] URL: [email or web address] Date: 09/04/2023 Prepared by: Gellen April Earnie Starring  Exercises - Supine Heel Slide  - 1 x daily - 7 x weekly - 1 sets - 10 reps - Small Range Straight Leg Raise  - 1 x daily - 7 x weekly - 1 sets - 10 reps - Proper Sit to Stand Technique  - 10 x daily - 7  x weekly - 1 sets - 2-3 reps - Supine Bridge with Resistance Band  - 1 x daily - 7 x weekly - 1 sets - 10 reps  ASSESSMENT:  CLINICAL IMPRESSION: Trouble with shuffling gait and walker placement to far forward negatively impacts stability in ambulation.  Consistent cues required in clinic to keep reminders of task at hand.  Continued LE strengthening and balance improvements to help improve ambulation and other daily mobility.     OBJECTIVE IMPAIRMENTS: Abnormal gait, decreased activity tolerance, decreased balance, decreased coordination, decreased endurance, decreased mobility, difficulty walking, decreased ROM, decreased strength, hypomobility, increased fascial restrictions, impaired flexibility, improper body mechanics, postural dysfunction, and pain.   ACTIVITY LIMITATIONS: carrying, lifting, bending, standing, squatting, stairs, transfers, bed mobility, bathing, toileting, dressing, hygiene/grooming, and locomotion level  PARTICIPATION LIMITATIONS: cleaning, interpersonal relationship, and community activity  PERSONAL FACTORS: Age, Fitness, Past/current experiences, Time since onset of injury/illness/exacerbation, and 1 comorbidity: R THA are also affecting patient's functional outcome.   REHAB POTENTIAL: Good  CLINICAL DECISION MAKING: Evolving/moderate complexity  EVALUATION COMPLEXITY: Moderate   GOALS: Goals reviewed with patient? Yes  SHORT TERM GOALS: Target date: 10/09/2023  Pt will be ind with initial HEP Baseline: Goal status: on going 09/22/2023  2.  Pt will be able to perform 5x STS without assist Baseline:  Goal status: on going 09/22/2023  3.  Pt will be able to amb at least 50' with RW SBA for home mobility Baseline:  Goal status: on going 09/22/2023   LONG TERM GOALS: Target date: 11/13/2023   Pt will be ind with management and progression of HEP Baseline:  Goal status: INITIAL  2.  Pt will  improve TUG to </=1'30 to demo improved gait  pattern Baseline:  Goal status: INITIAL  3.  Pt will be able to amb at least 150' with RW SBA for safe home amb Baseline:  Goal status: INITIAL  4.  Pt will have improved PSFS average score to >/=5 to demo MCID Baseline:  Goal status: INITIAL  5.  Pt will be able to perform all transfers mod I for home to reduce caregiver burden Baseline:  Goal status: INITIAL  PLAN:  PT FREQUENCY: 2x/week  PT DURATION: 10 weeks  PLANNED INTERVENTIONS: 97164- PT Re-evaluation, 97750- Physical Performance Testing, 97110-Therapeutic exercises, 97530- Therapeutic activity, [id number]- Neuromuscular re-education, 97535- Self Care, [id number]- Manual therapy, [id number]- Gait training, [phone]- Aquatic Therapy, [phone]- Electrical stimulation (unattended), 97016- Vasopneumatic device, [id number]- Ultrasound, [id number]- Ionotophoresis 4mg /ml Dexamethasone , 79439 (1-2 muscles), 20561 (3+ muscles)- Dry Needling, Patient/Family education, Balance training, Stair training, Taping, Joint mobilization, Cryotherapy, and Moist heat  PLAN FOR NEXT SESSION: Improvement in step length, balance control, strengthening as able.    Ozell Silvan, PT, DPT, OCS, ATC 09/29/23  4:44 PM

## 2023-10-01 ENCOUNTER — Ambulatory Visit: Admitting: Physical Therapy

## 2023-10-01 ENCOUNTER — Encounter: Payer: Self-pay | Admitting: Physical Therapy

## 2023-10-01 DIAGNOSIS — R296 Repeated falls: Secondary | ICD-10-CM

## 2023-10-01 DIAGNOSIS — R262 Difficulty in walking, not elsewhere classified: Secondary | ICD-10-CM | POA: Diagnosis not present

## 2023-10-01 DIAGNOSIS — M25551 Pain in right hip: Secondary | ICD-10-CM

## 2023-10-01 DIAGNOSIS — M6281 Muscle weakness (generalized): Secondary | ICD-10-CM

## 2023-10-01 DIAGNOSIS — M25552 Pain in left hip: Secondary | ICD-10-CM

## 2023-10-01 DIAGNOSIS — R2681 Unsteadiness on feet: Secondary | ICD-10-CM

## 2023-10-01 NOTE — Therapy (Signed)
 OUTPATIENT PHYSICAL THERAPY TREATMENT   Patient Name: Lee Nelson MRN: [id number] DOB:06-17-1940, 83 y.o., male Today's Date: 10/01/2023  END OF SESSION:  PT End of Session - 10/01/23 1515     Visit Number 4    Number of Visits 20    Date for PT Re-Evaluation 11/13/23    Authorization Type Medicare    Progress Note Due on Visit 10    PT Start Time 1515    PT Stop Time 1555    PT Time Calculation (min) 40 min    Activity Tolerance Patient limited by fatigue    Behavior During Therapy [facility] for tasks assessed/performed            Past Medical History:  Diagnosis Date   Anxiety    Arthritis    Bladder calculus    BPH (benign prostatic hyperplasia)    Chronic constipation    Complication of anesthesia     I had some coughing afterwards for a couple of days--  per pt perfers spinal anesthesia since general anesthesia congnitive issues when older   Diverticulosis of colon    Dry eye syndrome of both eyes    Environmental allergies    GERD (gastroesophageal reflux disease)    occasional   History of adenomatous polyp of colon    08/ 2004   History of kidney stones    History of squamous cell carcinoma in situ (SCCIS) of skin    s/p  excision 2013 facial areas and 06/ 2016 nose   Migraine    eye migraine occasional   Seasonal and perennial allergic rhinitis    Thrombocytopenia (HCC)    Tingling    hands and feet bilat , intermittantly-- per pt has lumbar bulging disk   Urinary frequency    Vocal fold atrophy    dysphonia-- per pt has to drink large amount of water  to take even on pill   Wears glasses    Past Surgical History:  Procedure Laterality Date   APPENDECTOMY  child   CARDIOVASCULAR STRESS TEST  05-17-2015  dr hilty   Low risk nuclear study w/ no ischemia/  normal LV function and wall motion , stress ef 54% (lvef 45-54%)   CHOLECYSTECTOMY N/A 09/29/2014   Procedure: LAPAROSCOPIC CHOLECYSTECTOMY WITH INTRAOPERATIVE CHOLANGIOGRAM;  Surgeon: Alm Angle, MD;  Location: [facility];  Service: General;  Laterality: N/A;   COLONOSCOPY  last one 09-06-2010   ESOPHAGOGASTRODUODENOSCOPY N/A 12/09/2013   Procedure: ESOPHAGOGASTRODUODENOSCOPY (EGD);  Surgeon: Elsie Cree, MD;  Location: [facility] END[facility]COPY;  Service: Endoscopy;  Laterality: N/A;   EXTRACORPOREAL SHOCK WAVE LITHOTRIPSY  yrs ago   INGUINAL HERNIA REPAIR Left child   inguinal hernia repair  09/2017   NISSEN FUNDOPLICATION  1980's   open   STONE EXTRACTION WITH BASKET N/A 08/18/2016   Procedure: STONE EXTRACTION WITH BASKET;  Surgeon: Chales Idol, MD;  Location: [facility];  Service: Urology;  Laterality: N/A;   THULIUM LASER TURP (TRANSURETHRAL RESECTION OF PROSTATE) N/A 08/18/2016   Procedure: THULIUM LASER BLADDER NECK INCISION AND BLADDER STONE REMOVAL;  Surgeon: Chales Idol, MD;  Location: [facility];  Service: Urology;  Laterality: N/A;   TONSILLECTOMY  child   TOTAL HIP ARTHROPLASTY Right 09/07/2022   Procedure: TOTAL HIP ARTHROPLASTY ANTERIOR APPROACH;  Surgeon: Vernetta Lonni GRADE, MD;  Location: [facility];  Service: Orthopedics;  Laterality: Right;   TRANSTHORACIC ECHOCARDIOGRAM  11/18/2010   grade 1 diastolic dysfunction, ef 55-60%/  trivial MR and TR/ mild  dilated RA   Patient Active Problem List   Diagnosis Date Noted   Status post total replacement of right hip 09/22/2022   Protein-calorie malnutrition, severe 09/08/2022   Hip fracture (HCC) 09/07/2022   BPH (benign prostatic hyperplasia) 09/07/2022   Closed fracture of right femur, unspecified fracture morphology, initial encounter (HCC) 09/07/2022   Fall 09/07/2022   Closed subcapital fracture of neck of femur, right, initial encounter (HCC) 09/07/2022   Memory impairment 08/06/2022   Pneumonia due to COVID-19 virus 05/06/2022   Severe sepsis (HCC) 05/06/2022   Acute hypoxemic respiratory failure (HCC) 05/06/2022   NPH (normal pressure hydrocephalus) (HCC) 05/06/2022    Physical deconditioning 05/06/2022   Thrombocytopenia (HCC) 05/06/2022   Dementia (HCC) 05/06/2022   SBO (small bowel obstruction) (HCC) 12/31/2019   Amaurosis fugax of right eye 08/25/2017   PSVT (paroxysmal supraventricular tachycardia) (HCC) 09/12/2015   Chest pain 05/03/2015   Dyspnea 05/03/2015   ALLERGIC RHINITIS 09/21/2007   G E R D 09/21/2007   SMOKE INHALATION 09/21/2007   OSTEOPOROSIS 01/28/2007   PALPITATIONS 01/28/2007   COUGH 01/28/2007   ALLERGY 01/28/2007    PCP: Janey Santos, MD  REFERRING PROVIDER: Gretta Bertrum ORN, PA-C  REFERRING DIAG:  [phone] (ICD-10-CM) - Closed subcapital fracture of neck of femur, right, initial encounter (HCC)  M25.551 (ICD-10-CM) - Pain in right hip    THERAPY DIAG:  Difficulty in walking, not elsewhere classified  Muscle weakness (generalized)  Unsteadiness on feet  Repeated falls  Pain of both hip joints  Rationale for Evaluation and Treatment: Rehabilitation  ONSET DATE: ~1 month ago  SUBJECTIVE:   SUBJECTIVE STATEMENT: Pt reports not much pain.   PERTINENT HISTORY:  R THA June 2024, Hx of dementia, anxiety, OA, osteoporosis, BPH, skin cancer, PSVT, chronic thrombocytopenia, shingles,   PAIN:  PRS scale: no specific pain upon arrival today Pain location: posterior hips bilateral  Pain description: aching at rest but can be sharp  Aggravating factors: Sometimes getting out of bed, sitting to standing Relieving factors: Heat, pain medication   PRECAUTIONS: From ortho H&P: Weight-bear as tolerated with a walker. He is able to ambulate for exercise. No abduction of the left hip.  RED FLAGS: None   WEIGHT BEARING RESTRICTIONS: WBAT  FALLS:  Has patient fallen in last 6 months? Yes. Number of falls 2 -- At [employer] and lost balance while walking ~1 month ago  LIVING ENVIRONMENT: Lives with: lives with their spouse Lives in: House/apartment Stairs: Ramped and level Has following equipment at home:  Environmental consultant - 2 wheeled, Wheelchair (manual), and urinal and BSC, grab bars in bathroom but currently not using shower  OCCUPATION: Retired  PLOF: Independent with household mobility with device and Needs assistance with ADLs  PATIENT GOALS: Return to walking and get in/out of chair with less pain, stand  NEXT MD VISIT: n/a  OBJECTIVE:  Note: Objective measures were completed at Evaluation unless otherwise noted.  DIAGNOSTIC FINDINGS: 1. Minimally displaced fracture through the base of the greater trochanter. No intertrochanteric extension is identified but fractures can be occult on CT, particularly in the elderly. If there is concern for further fracture, MRI without contrast is the best test for evaluation. 2. Mild left hip osteoarthritis. 3. Large stool ball in the rectosigmoid colon.  PATIENT SURVEYS:  The Patient-Specific Functional Scale   Eval:   Standing 4   2.   Walking  2   3.   Transfers 6   4.   Stairs 0   AVERAGE  3      COGNITION: 09/04/2023 Overall cognitive status: Within functional limits for tasks assessed     SENSATION: 09/04/2023 [facility]  EDEMA:  09/04/2023 Mild edema in R>L  MUSCLE LENGTH: 09/04/2023 Hamstrings: see knee ROM Thomas test: limited bilat  POSTURE: 09/04/2023  increased thoracic kyphosis  PALPATION: 09/04/2023 Did not assess  LOWER EXTREMITY ROM:  Active ROM Right Eval 09/04/2023 Left Eval 09/04/2023  Hip flexion    Hip extension    Hip abduction    Hip adduction    Hip internal rotation    Hip external rotation    Knee flexion    Knee extension -15 -30  Ankle dorsiflexion    Ankle plantarflexion    Ankle inversion    Ankle eversion     (Blank rows = not tested)  LOWER EXTREMITY MMT:  MMT Right Eval 09/04/2023 Left Eval 09/04/2023  Hip flexion 4* 4*  Hip extension    Hip abduction 3+ Did not assess due to precautions  Hip adduction 4+ 4  Hip internal rotation    Hip external rotation    Knee flexion 4+ 4+  Knee  extension 4- * 4- pulls in back  Ankle dorsiflexion 4 4  Ankle plantarflexion 4- 4-  Ankle inversion    Ankle eversion     (Blank rows = not tested, * = pain)  TRANSFERS:  09/04/2023  Sit<>stand: min A with RW for stability, pt using UEs to come to full standing  Stand pivot t/f: CGA with RW  Sit<>sup: min A for L LE management  FUNCTIONAL TESTS:  09/04/2023 TUG: 2 min 2 sec  GAIT: 09/22/2023:  FWW ambulation c reduced step lengths, shuffling gait, forward trunk lean.  Performed < 100 ft.   09/04/2023 Distance walked: 10' Assistive device utilized: Environmental consultant - 2 wheeled Level of assistance: CGA Comments: Antalgic, step to pattern leading with L LE, very diminished bilat                     TREATMENT        DATE: 10/01/2023 Therex: Nustep Lvl 5 x 6 min UE/LE for ROM Seated hamstring stretch 2x 30 Seated calf stretch with strap 2x30  TherAct: Reach fwd sit<>stand 2x5 Standing overhead reach for trunk/hip extension x10 with chair support Standing side reach for thoracic extension/rotation x10 with chair support  Gait Training Standing heel strike fwd and bwd 2x10 R&L with chair support FWW with CGA with consistent verbal cues throughout for RW negotiation/foot placement, heel strike, increased step length Performed 100 ft, 130 ft, 30 ft   TREATMENT        DATE: 09/29/2023 Therex: Nustep Lvl 5 5 mins x 2  mins UE/LE for ROM Seated marching to 6 inch cones 3 anterior x 5 each, performed bilaterally.  Seated yellow small ball hip adductor isometric squeeze bilateral 5 sec hold x 10  Seated isometric bilateral hip abduction 5 sec holld x 10 with use of belt around knees to prevent hip abduction range.   Neuro Re-ed (balance control, stability, postural improvements) BIG inspired stepping with CGA in // bars x 5 each side forward, x 6 each side with UE flexion in movement BIG inspired retro stepping with CGA in // bars x 10 bilateral    Gait Training FWW with CGA with consistent  verbal cues throughout for increased foot clearing, step length.  Focused on improvement in walker placement for safety as well as increasing step length.   Performed 100 ft,  130 ft, 30 ft  TREATMENT        DATE: 09/22/2023 Therex: Nustep Lvl 5 8 mins UE/LE for ROM Seated yellow small ball hip adductor isometric squeeze bilateral 5 sec hold x 10  Seated isometric bilateral hip abduction 5 sec holld x 10 with use of belt around knees to prevent hip abduction range.   Neuro Re-ed (balance control, stability, postural improvements) Standing balance no UE assist 1 min c CGA to min A at times to prevent posterior loss of balance. Cues for performance.   TherActivity (to improve functional movement patterns)  Seated back supported leg lift to 6 inch cones 3 anteriorly x 5 each bilaterally to help improve foot clearance in ambulation, transfers.  Standing with FWW with leg lift to 6 inch cones 3 anteriorly x 3 each bilaterally with CGA to help improve foot clearance in ambulation and transfers.   Transfer education and performance with wheelchair to chair transfer to Rt with cues for hand placement, use of arm rests not walker in movement pattern.  Standard sit to stand to sit transfers performed throughout intervention c cues for hand placement and movement pattern cues to reduce use of walker in movement for safety.    Gait Training FWW with CGA with consistent verbal cues throughout with visual cues of colored bands on walker for foot placement.  Focused on improvement in walker placement for safety as well as increasing step length.  Initial ambulation with step to gait pattern then progressed into step through.  Performed 100 ft, 30 ft.    TREATMENT        DATE: 09/04/23 See HEP below    PATIENT EDUCATION:  Education details: Exam findings, POC, initial HEP, importance of weightbearing Person educated: Patient and Caregiver   Education method: Explanation, Demonstration, Verbal cues, and  Handouts Education comprehension: verbalized understanding, returned demonstration, and needs further education  HOME EXERCISE PROGRAM: Access Code: [email or web address] URL: [email or web address] Date: 09/04/2023 Prepared by: Aswad Wandrey April Earnie Starring  Exercises - Supine Heel Slide  - 1 x daily - 7 x weekly - 1 sets - 10 reps - Small Range Straight Leg Raise  - 1 x daily - 7 x weekly - 1 sets - 10 reps - Proper Sit to Stand Technique  - 10 x daily - 7 x weekly - 1 sets - 2-3 reps - Supine Bridge with Resistance Band  - 1 x daily - 7 x weekly - 1 sets - 10 reps  ASSESSMENT:  CLINICAL IMPRESSION: Worked on hamstring and calf stretching to decrease pt's knee and hip flexed posture. Treatment focused on improving sit<>stand t/fs, standing posture and gait. Improving heel strike and step length given cueing.    OBJECTIVE IMPAIRMENTS: Abnormal gait, decreased activity tolerance, decreased balance, decreased coordination, decreased endurance, decreased mobility, difficulty walking, decreased ROM, decreased strength, hypomobility, increased fascial restrictions, impaired flexibility, improper body mechanics, postural dysfunction, and pain.   ACTIVITY LIMITATIONS: carrying, lifting, bending, standing, squatting, stairs, transfers, bed mobility, bathing, toileting, dressing, hygiene/grooming, and locomotion level  PARTICIPATION LIMITATIONS: cleaning, interpersonal relationship, and community activity  PERSONAL FACTORS: Age, Fitness, Past/current experiences, Time since onset of injury/illness/exacerbation, and 1 comorbidity: R THA are also affecting patient's functional outcome.   REHAB POTENTIAL: Good  CLINICAL DECISION MAKING: Evolving/moderate complexity  EVALUATION COMPLEXITY: Moderate   GOALS: Goals reviewed with patient? Yes  SHORT TERM GOALS: Target date: 10/09/2023  Pt will be ind with initial HEP Baseline: Goal status: on going 09/22/2023  2.  Pt will  be able to perform  5x STS without assist Baseline:  Goal status: on going 09/22/2023  3.  Pt will be able to amb at least 50' with RW SBA for home mobility Baseline:  Goal status: on going 09/22/2023   LONG TERM GOALS: Target date: 11/13/2023   Pt will be ind with management and progression of HEP Baseline:  Goal status: INITIAL  2.  Pt will improve TUG to </=1'30 to demo improved gait pattern Baseline:  Goal status: INITIAL  3.  Pt will be able to amb at least 150' with RW SBA for safe home amb Baseline:  Goal status: INITIAL  4.  Pt will have improved PSFS average score to >/=5 to demo MCID Baseline:  Goal status: INITIAL  5.  Pt will be able to perform all transfers mod I for home to reduce caregiver burden Baseline:  Goal status: INITIAL  PLAN:  PT FREQUENCY: 2x/week  PT DURATION: 10 weeks  PLANNED INTERVENTIONS: 97164- PT Re-evaluation, 97750- Physical Performance Testing, 97110-Therapeutic exercises, 97530- Therapeutic activity, 97112- Neuromuscular re-education, 97535- Self Care, [id number]- Manual therapy, [id number]- Gait training, [phone]- Aquatic Therapy, [phone]- Electrical stimulation (unattended), 97016- Vasopneumatic device, [id number]- Ultrasound, [id number]- Ionotophoresis 4mg /ml Dexamethasone , 79439 (1-2 muscles), 20561 (3+ muscles)- Dry Needling, Patient/Family education, Balance training, Stair training, Taping, Joint mobilization, Cryotherapy, and Moist heat  PLAN FOR NEXT SESSION: Improvement in step length, balance control, strengthening as able.    Tawana Pasch April Ma L Angelo Caroll, PT, DPT 10/01/23  3:16 PM

## 2023-10-05 ENCOUNTER — Ambulatory Visit: Payer: Self-pay

## 2023-10-05 ENCOUNTER — Encounter: Payer: Self-pay | Admitting: Rehabilitative and Restorative Service Providers"

## 2023-10-05 ENCOUNTER — Ambulatory Visit (INDEPENDENT_AMBULATORY_CARE_PROVIDER_SITE_OTHER): Admitting: Physician Assistant

## 2023-10-05 ENCOUNTER — Ambulatory Visit (INDEPENDENT_AMBULATORY_CARE_PROVIDER_SITE_OTHER): Admitting: Rehabilitative and Restorative Service Providers"

## 2023-10-05 ENCOUNTER — Encounter: Payer: Self-pay | Admitting: Physician Assistant

## 2023-10-05 DIAGNOSIS — R262 Difficulty in walking, not elsewhere classified: Secondary | ICD-10-CM

## 2023-10-05 DIAGNOSIS — M25551 Pain in right hip: Secondary | ICD-10-CM | POA: Diagnosis not present

## 2023-10-05 DIAGNOSIS — M6281 Muscle weakness (generalized): Secondary | ICD-10-CM | POA: Diagnosis not present

## 2023-10-05 DIAGNOSIS — R2681 Unsteadiness on feet: Secondary | ICD-10-CM

## 2023-10-05 DIAGNOSIS — M25552 Pain in left hip: Secondary | ICD-10-CM | POA: Diagnosis not present

## 2023-10-05 DIAGNOSIS — R296 Repeated falls: Secondary | ICD-10-CM

## 2023-10-05 DIAGNOSIS — S72102D Unspecified trochanteric fracture of left femur, subsequent encounter for closed fracture with routine healing: Secondary | ICD-10-CM

## 2023-10-05 NOTE — Progress Notes (Signed)
 HPI: Lee Nelson returns today now 7 weeks 4 days status post injury.  He sustained a left greater trochanteric fracture which has remained nondisplaced.  He is walking short distances with a walker.  Mostly using a wheelchair to ambulate.  Has pain mostly at night and with prolonged walking he describes this is uncomfortable only.  Has had no new injuries.  He presents today with a caregiver.  Physical exam: Left hip: Limited internal/external rotation.  Tenderness over the greater trochanteric region.  Radiographs: AP pelvis lateral view left hip: Hips well located.  Status post right total hip arthroplasty without complicating features.  Left hip greater trochanteric fracture remains unchanged in overall position alignment, there is considerable callus formation  Impression: Left hip greater trochanteric fracture  displaced   Plan: He is activities as tolerated discussed with him and the caregiver that he needs to go slow and needs the rolling walker.  Follow-up with us  as needed.  He may have some soreness for up to 12 weeks.  Questions were encouraged and answered.

## 2023-10-05 NOTE — Therapy (Signed)
 OUTPATIENT PHYSICAL THERAPY TREATMENT   Patient Name: TIMOUTHY GILARDI MRN: [id number] DOB:1940-09-01, 83 y.o., male Today's Date: 10/05/2023  END OF SESSION:  PT End of Session - 10/05/23 1421     Visit Number 5    Number of Visits 20    Date for PT Re-Evaluation 11/13/23    Authorization Type Medicare    Progress Note Due on Visit 10    PT Start Time 1423    PT Stop Time 1503    PT Time Calculation (min) 40 min    Activity Tolerance Patient limited by fatigue    Behavior During Therapy [facility] for tasks assessed/performed             Past Medical History:  Diagnosis Date   Anxiety    Arthritis    Bladder calculus    BPH (benign prostatic hyperplasia)    Chronic constipation    Complication of anesthesia     I had some coughing afterwards for a couple of days--  per pt perfers spinal anesthesia since general anesthesia congnitive issues when older   Diverticulosis of colon    Dry eye syndrome of both eyes    Environmental allergies    GERD (gastroesophageal reflux disease)    occasional   History of adenomatous polyp of colon    08/ 2004   History of kidney stones    History of squamous cell carcinoma in situ (SCCIS) of skin    s/p  excision 2013 facial areas and 06/ 2016 nose   Migraine    eye migraine occasional   Seasonal and perennial allergic rhinitis    Thrombocytopenia (HCC)    Tingling    hands and feet bilat , intermittantly-- per pt has lumbar bulging disk   Urinary frequency    Vocal fold atrophy    dysphonia-- per pt has to drink large amount of water  to take even on pill   Wears glasses    Past Surgical History:  Procedure Laterality Date   APPENDECTOMY  child   CARDIOVASCULAR STRESS TEST  05-17-2015  dr hilty   Low risk nuclear study w/ no ischemia/  normal LV function and wall motion , stress ef 54% (lvef 45-54%)   CHOLECYSTECTOMY N/A 09/29/2014   Procedure: LAPAROSCOPIC CHOLECYSTECTOMY WITH INTRAOPERATIVE CHOLANGIOGRAM;  Surgeon: Alm Angle, MD;  Location: [facility];  Service: General;  Laterality: N/A;   COLONOSCOPY  last one 09-06-2010   ESOPHAGOGASTRODUODENOSCOPY N/A 12/09/2013   Procedure: ESOPHAGOGASTRODUODENOSCOPY (EGD);  Surgeon: Elsie Cree, MD;  Location: [facility];  Service: Endoscopy;  Laterality: N/A;   EXTRACORPOREAL SHOCK WAVE LITHOTRIPSY  yrs ago   INGUINAL HERNIA REPAIR Left child   inguinal hernia repair  09/2017   NISSEN FUNDOPLICATION  1980's   open   STONE EXTRACTION WITH BASKET N/A 08/18/2016   Procedure: STONE EXTRACTION WITH BASKET;  Surgeon: Chales Idol, MD;  Location: [facility];  Service: Urology;  Laterality: N/A;   THULIUM LASER TURP (TRANSURETHRAL RESECTION OF PROSTATE) N/A 08/18/2016   Procedure: THULIUM LASER BLADDER NECK INCISION AND BLADDER STONE REMOVAL;  Surgeon: Chales Idol, MD;  Location: [facility];  Service: Urology;  Laterality: N/A;   TONSILLECTOMY  child   TOTAL HIP ARTHROPLASTY Right 09/07/2022   Procedure: TOTAL HIP ARTHROPLASTY ANTERIOR APPROACH;  Surgeon: Vernetta Lonni GRADE, MD;  Location: [facility];  Service: Orthopedics;  Laterality: Right;   TRANSTHORACIC ECHOCARDIOGRAM  11/18/2010   grade 1 diastolic dysfunction, ef 55-60%/  trivial MR and TR/  mild dilated RA   Patient Active Problem List   Diagnosis Date Noted   Status post total replacement of right hip 09/22/2022   Protein-calorie malnutrition, severe 09/08/2022   Hip fracture (HCC) 09/07/2022   BPH (benign prostatic hyperplasia) 09/07/2022   Closed fracture of right femur, unspecified fracture morphology, initial encounter (HCC) 09/07/2022   Fall 09/07/2022   Closed subcapital fracture of neck of femur, right, initial encounter (HCC) 09/07/2022   Memory impairment 08/06/2022   Pneumonia due to COVID-19 virus 05/06/2022   Severe sepsis (HCC) 05/06/2022   Acute hypoxemic respiratory failure (HCC) 05/06/2022   NPH (normal pressure hydrocephalus) (HCC) 05/06/2022    Physical deconditioning 05/06/2022   Thrombocytopenia (HCC) 05/06/2022   Dementia (HCC) 05/06/2022   SBO (small bowel obstruction) (HCC) 12/31/2019   Amaurosis fugax of right eye 08/25/2017   PSVT (paroxysmal supraventricular tachycardia) (HCC) 09/12/2015   Chest pain 05/03/2015   Dyspnea 05/03/2015   ALLERGIC RHINITIS 09/21/2007   G E R D 09/21/2007   SMOKE INHALATION 09/21/2007   OSTEOPOROSIS 01/28/2007   PALPITATIONS 01/28/2007   COUGH 01/28/2007   ALLERGY 01/28/2007    PCP: Janey Santos, MD  REFERRING PROVIDER: Gretta Bertrum ORN, PA-C  REFERRING DIAG:  [id number] (ICD-10-CM) - Closed subcapital fracture of neck of femur, right, initial encounter (HCC)  M25.551 (ICD-10-CM) - Pain in right hip    THERAPY DIAG:  Difficulty in walking, not elsewhere classified  Muscle weakness (generalized)  Unsteadiness on feet  Repeated falls  Pain of both hip joints  Rationale for Evaluation and Treatment: Rehabilitation  ONSET DATE: ~1 month ago  SUBJECTIVE:   SUBJECTIVE STATEMENT: Pt indicated having mild pain in back of Rt hip.  Pt indicated wanting to work on getting in and out chair.   PERTINENT HISTORY:  R THA June 2024, Hx of dementia, anxiety, OA, osteoporosis, BPH, skin cancer, PSVT, chronic thrombocytopenia, shingles,   PAIN:  PRS scale: no specific pain upon arrival today Pain location: posterior hips bilateral  Pain description: aching at rest but can be sharp  Aggravating factors: Sometimes getting out of bed, sitting to standing Relieving factors: Heat, pain medication   PRECAUTIONS: From ortho H&P: Weight-bear as tolerated with a walker. He is able to ambulate for exercise. No abduction of the left hip.  RED FLAGS: None   WEIGHT BEARING RESTRICTIONS: WBAT  FALLS:  Has patient fallen in last 6 months? Yes. Number of falls 2 -- At [employer] and lost balance while walking ~1 month ago  LIVING ENVIRONMENT: Lives with: lives with their spouse Lives  in: House/apartment Stairs: Ramped and level Has following equipment at home: Environmental consultant - 2 wheeled, Wheelchair (manual), and urinal and BSC, grab bars in bathroom but currently not using shower  OCCUPATION: Retired  PLOF: Independent with household mobility with device and Needs assistance with ADLs  PATIENT GOALS: Return to walking and get in/out of chair with less pain, stand  NEXT MD VISIT: n/a  OBJECTIVE:  Note: Objective measures were completed at Evaluation unless otherwise noted.  DIAGNOSTIC FINDINGS: 1. Minimally displaced fracture through the base of the greater trochanter. No intertrochanteric extension is identified but fractures can be occult on CT, particularly in the elderly. If there is concern for further fracture, MRI without contrast is the best test for evaluation. 2. Mild left hip osteoarthritis. 3. Large stool ball in the rectosigmoid colon.  PATIENT SURVEYS:  The Patient-Specific Functional Scale   Eval:   Standing 4   2.   Walking  2   3.   Transfers 6   4.   Stairs 0   AVERAGE 3      COGNITION: 09/04/2023 Overall cognitive status: Within functional limits for tasks assessed     SENSATION: 09/04/2023 [facility]  EDEMA:  09/04/2023 Mild edema in R>L  MUSCLE LENGTH: 09/04/2023 Hamstrings: see knee ROM Thomas test: limited bilat  POSTURE: 09/04/2023  increased thoracic kyphosis  PALPATION: 09/04/2023 Did not assess  LOWER EXTREMITY ROM:  Active ROM Right Eval 09/04/2023 Left Eval 09/04/2023  Hip flexion    Hip extension    Hip abduction    Hip adduction    Hip internal rotation    Hip external rotation    Knee flexion    Knee extension -15 -30  Ankle dorsiflexion    Ankle plantarflexion    Ankle inversion    Ankle eversion     (Blank rows = not tested)  LOWER EXTREMITY MMT:  MMT Right Eval 09/04/2023 Left Eval 09/04/2023  Hip flexion 4* 4*  Hip extension    Hip abduction 3+ Did not assess due to precautions  Hip adduction 4+ 4  Hip  internal rotation    Hip external rotation    Knee flexion 4+ 4+  Knee extension 4- * 4- pulls in back  Ankle dorsiflexion 4 4  Ankle plantarflexion 4- 4-  Ankle inversion    Ankle eversion     (Blank rows = not tested, * = pain)  TRANSFERS:  09/04/2023  Sit<>stand: min A with RW for stability, pt using UEs to come to full standing  Stand pivot t/f: CGA with RW  Sit<>sup: min A for L LE management  FUNCTIONAL TESTS:  09/04/2023 TUG: 2 min 2 sec  GAIT: 09/22/2023:  FWW ambulation c reduced step lengths, shuffling gait, forward trunk lean.  Performed < 100 ft.   09/04/2023 Distance walked: 10' Assistive device utilized: Environmental consultant - 2 wheeled Level of assistance: CGA Comments: Antalgic, step to pattern leading with L LE, very diminished bilat                     TREATMENT        DATE: 10/05/2023 Therex: Nustep Lvl 5 10 mins  UE/LE for ROM Seated yellow small ball hip adductor isometric squeeze bilateral 5 sec hold x 10  - no back rest Seated isometric bilateral hip abduction 5 sec holld x 10 with use of belt around knees to prevent hip abduction range.  No back rest  Neuro Re-ed (balance control, stability, postural improvements) BIG inspired stepping with CGA in // bars x 10 bilateral   TherActivity Pivot transfer education and trial x 2 each direction from wheelchair to seat with verbal cues for techniques, foot placement and performance to help improve transfer ability at home.  Standing retro step with lateral step for transfers with CGA to min A at times x 10 bilateral  Seated bilateral UE reach with thoracic extension with yellow un weighted ball Rollator ambulation with CGA with occasional to moderate cues for step length improvements, Performed 50 ft.   TREATMENT        DATE: 10/01/2023 Therex: Nustep Lvl 5 x 6 min UE/LE for ROM Seated hamstring stretch 2x 30 Seated calf stretch with strap 2x30  TherAct: Reach fwd sit<>stand 2x5 Standing overhead reach for trunk/hip  extension x10 with chair support Standing side reach for thoracic extension/rotation x10 with chair support  Gait Training Standing heel strike fwd and bwd 2x10 R&L with  chair support FWW with CGA with consistent verbal cues throughout for RW negotiation/foot placement, heel strike, increased step length Performed 100 ft, 130 ft, 30 ft   TREATMENT        DATE: 09/29/2023 Therex: Nustep Lvl 5 5 mins x 2  mins UE/LE for ROM Seated marching to 6 inch cones 3 anterior x 5 each, performed bilaterally.  Seated yellow small ball hip adductor isometric squeeze bilateral 5 sec hold x 10  Seated isometric bilateral hip abduction 5 sec holld x 10 with use of belt around knees to prevent hip abduction range.   Neuro Re-ed (balance control, stability, postural improvements) BIG inspired stepping with CGA in // bars x 5 each side forward, x 6 each side with UE flexion in movement BIG inspired retro stepping with CGA in // bars x 10 bilateral    Gait Training FWW with CGA with consistent verbal cues throughout for increased foot clearing, step length.  Focused on improvement in walker placement for safety as well as increasing step length.   Performed 100 ft, 130 ft, 30 ft  TREATMENT        DATE: 09/22/2023 Therex: Nustep Lvl 5 8 mins UE/LE for ROM Seated yellow small ball hip adductor isometric squeeze bilateral 5 sec hold x 10  Seated isometric bilateral hip abduction 5 sec holld x 10 with use of belt around knees to prevent hip abduction range.   Neuro Re-ed (balance control, stability, postural improvements) Standing balance no UE assist 1 min c CGA to min A at times to prevent posterior loss of balance. Cues for performance.   TherActivity (to improve functional movement patterns)  Seated back supported leg lift to 6 inch cones 3 anteriorly x 5 each bilaterally to help improve foot clearance in ambulation, transfers.  Standing with FWW with leg lift to 6 inch cones 3 anteriorly x 3 each  bilaterally with CGA to help improve foot clearance in ambulation and transfers.   Transfer education and performance with wheelchair to chair transfer to Rt with cues for hand placement, use of arm rests not walker in movement pattern.  Standard sit to stand to sit transfers performed throughout intervention c cues for hand placement and movement pattern cues to reduce use of walker in movement for safety.    Gait Training FWW with CGA with consistent verbal cues throughout with visual cues of colored bands on walker for foot placement.  Focused on improvement in walker placement for safety as well as increasing step length.  Initial ambulation with step to gait pattern then progressed into step through.  Performed 100 ft, 30 ft.     PATIENT EDUCATION:  Education details: Exam findings, POC, initial HEP, importance of weightbearing Person educated: Patient and Caregiver   Education method: Explanation, Demonstration, Verbal cues, and Handouts Education comprehension: verbalized understanding, returned demonstration, and needs further education  HOME EXERCISE PROGRAM: Access Code: [email or web address] URL: [email or web address] Date: 09/04/2023 Prepared by: Gellen April Earnie Starring  Exercises - Supine Heel Slide  - 1 x daily - 7 x weekly - 1 sets - 10 reps - Small Range Straight Leg Raise  - 1 x daily - 7 x weekly - 1 sets - 10 reps - Proper Sit to Stand Technique  - 10 x daily - 7 x weekly - 1 sets - 2-3 reps - Supine Bridge with Resistance Band  - 1 x daily - 7 x weekly - 1 sets - 10 reps  ASSESSMENT:  CLINICAL IMPRESSION: Worked on  hamstring and calf stretching to decrease pt's knee and hip flexed posture. Treatment focused on improving sit<>stand t/fs, standing posture and gait. Improving heel strike and step length given cueing.    OBJECTIVE IMPAIRMENTS: Abnormal gait, decreased activity tolerance, decreased balance, decreased coordination, decreased endurance, decreased  mobility, difficulty walking, decreased ROM, decreased strength, hypomobility, increased fascial restrictions, impaired flexibility, improper body mechanics, postural dysfunction, and pain.   ACTIVITY LIMITATIONS: carrying, lifting, bending, standing, squatting, stairs, transfers, bed mobility, bathing, toileting, dressing, hygiene/grooming, and locomotion level  PARTICIPATION LIMITATIONS: cleaning, interpersonal relationship, and community activity  PERSONAL FACTORS: Age, Fitness, Past/current experiences, Time since onset of injury/illness/exacerbation, and 1 comorbidity: R THA are also affecting patient's functional outcome.   REHAB POTENTIAL: Good  CLINICAL DECISION MAKING: Evolving/moderate complexity  EVALUATION COMPLEXITY: Moderate   GOALS: Goals reviewed with patient? Yes  SHORT TERM GOALS: Target date: 10/09/2023  Pt will be ind with initial HEP Baseline: Goal status: on going 09/22/2023  2.  Pt will be able to perform 5x STS without assist Baseline:  Goal status: on going 09/22/2023  3.  Pt will be able to amb at least 50' with RW SBA for home mobility Baseline:  Goal status: on going 09/22/2023   LONG TERM GOALS: Target date: 11/13/2023   Pt will be ind with management and progression of HEP Baseline:  Goal status: INITIAL  2.  Pt will improve TUG to </=1'30 to demo improved gait pattern Baseline:  Goal status: INITIAL  3.  Pt will be able to amb at least 150' with RW SBA for safe home amb Baseline:  Goal status: INITIAL  4.  Pt will have improved PSFS average score to >/=5 to demo MCID Baseline:  Goal status: INITIAL  5.  Pt will be able to perform all transfers mod I for home to reduce caregiver burden Baseline:  Goal status: INITIAL  PLAN:  PT FREQUENCY: 2x/week  PT DURATION: 10 weeks  PLANNED INTERVENTIONS: 97164- PT Re-evaluation, 97750- Physical Performance Testing, 97110-Therapeutic exercises, 97530- Therapeutic activity, [id number]-  Neuromuscular re-education, 97535- Self Care, [id number]- Manual therapy, [id number]- Gait training, [phone]- Aquatic Therapy, [phone]- Electrical stimulation (unattended), 97016- Vasopneumatic device, [id number]- Ultrasound, [id number]- Ionotophoresis 4mg /ml Dexamethasone , 79439 (1-2 muscles), 20561 (3+ muscles)- Dry Needling, Patient/Family education, Balance training, Stair training, Taping, Joint mobilization, Cryotherapy, and Moist heat  PLAN FOR NEXT SESSION: Continue functional tranfers, LE strength, balance improvements.    Ozell Silvan, PT, DPT, OCS, ATC 10/05/23  3:06 PM

## 2023-10-08 ENCOUNTER — Encounter: Admitting: Physical Therapy

## 2023-10-14 NOTE — Therapy (Signed)
 OUTPATIENT PHYSICAL THERAPY TREATMENT   Patient Name: Lee Nelson MRN: [id number] DOB:[birth year], 83 y.o., male Today's Date: 10/15/2023  END OF SESSION:  PT End of Session - 10/15/23 1619     Visit Number 6    Number of Visits 20    Date for PT Re-Evaluation 11/13/23    Authorization Type Medicare    PT Start Time 1517    PT Stop Time 1559    PT Time Calculation (min) 42 min    Activity Tolerance Patient tolerated treatment well    Behavior During Therapy [facility] for tasks assessed/performed              Past Medical History:  Diagnosis Date   Anxiety    Arthritis    Bladder calculus    BPH (benign prostatic hyperplasia)    Chronic constipation    Complication of anesthesia     I had some coughing afterwards for a couple of days--  per pt perfers spinal anesthesia since general anesthesia congnitive issues when older   Diverticulosis of colon    Dry eye syndrome of both eyes    Environmental allergies    GERD (gastroesophageal reflux disease)    occasional   History of adenomatous polyp of colon    08/ 2004   History of kidney stones    History of squamous cell carcinoma in situ (SCCIS) of skin    s/p  excision 2013 facial areas and 06/ 2016 nose   Migraine    eye migraine occasional   Seasonal and perennial allergic rhinitis    Thrombocytopenia (HCC)    Tingling    hands and feet bilat , intermittantly-- per pt has lumbar bulging disk   Urinary frequency    Vocal fold atrophy    dysphonia-- per pt has to drink large amount of water  to take even on pill   Wears glasses    Past Surgical History:  Procedure Laterality Date   APPENDECTOMY  child   CARDIOVASCULAR STRESS TEST  05-17-2015  dr hilty   Low risk nuclear study w/ no ischemia/  normal LV function and wall motion , stress ef 54% (lvef 45-54%)   CHOLECYSTECTOMY N/A 09/29/2014   Procedure: LAPAROSCOPIC CHOLECYSTECTOMY WITH INTRAOPERATIVE CHOLANGIOGRAM;  Surgeon: Alm Angle, MD;  Location: [facility];  Service: General;  Laterality: N/A;   COLONOSCOPY  last one 09-06-2010   ESOPHAGOGASTRODUODENOSCOPY N/A 12/09/2013   Procedure: ESOPHAGOGASTRODUODENOSCOPY (EGD);  Surgeon: Elsie Cree, MD;  Location: [facility];  Service: Endoscopy;  Laterality: N/A;   EXTRACORPOREAL SHOCK WAVE LITHOTRIPSY  yrs ago   INGUINAL HERNIA REPAIR Left child   inguinal hernia repair  09/2017   NISSEN FUNDOPLICATION  1980's   open   STONE EXTRACTION WITH BASKET N/A 08/18/2016   Procedure: STONE EXTRACTION WITH BASKET;  Surgeon: Chales Idol, MD;  Location: [facility];  Service: Urology;  Laterality: N/A;   THULIUM LASER TURP (TRANSURETHRAL RESECTION OF PROSTATE) N/A 08/18/2016   Procedure: THULIUM LASER BLADDER NECK INCISION AND BLADDER STONE REMOVAL;  Surgeon: Chales Idol, MD;  Location: [facility];  Service: Urology;  Laterality: N/A;   TONSILLECTOMY  child   TOTAL HIP ARTHROPLASTY Right 09/07/2022   Procedure: TOTAL HIP ARTHROPLASTY ANTERIOR APPROACH;  Surgeon: Vernetta Lonni GRADE, MD;  Location: [facility];  Service: Orthopedics;  Laterality: Right;   TRANSTHORACIC ECHOCARDIOGRAM  11/18/2010   grade 1 diastolic dysfunction, ef 55-60%/  trivial MR and TR/ mild dilated RA   Patient Active Problem  List   Diagnosis Date Noted   Status post total replacement of right hip 09/22/2022   Protein-calorie malnutrition, severe 09/08/2022   Hip fracture (HCC) 09/07/2022   BPH (benign prostatic hyperplasia) 09/07/2022   Closed fracture of right femur, unspecified fracture morphology, initial encounter (HCC) 09/07/2022   Fall 09/07/2022   Closed subcapital fracture of neck of femur, right, initial encounter (HCC) 09/07/2022   Memory impairment 08/06/2022   Pneumonia due to COVID-19 virus 05/06/2022   Severe sepsis (HCC) 05/06/2022   Acute hypoxemic respiratory failure (HCC) 05/06/2022   NPH (normal pressure hydrocephalus) (HCC) 05/06/2022   Physical deconditioning  05/06/2022   Thrombocytopenia (HCC) 05/06/2022   Dementia (HCC) 05/06/2022   SBO (small bowel obstruction) (HCC) 12/31/2019   Amaurosis fugax of right eye 08/25/2017   PSVT (paroxysmal supraventricular tachycardia) (HCC) 09/12/2015   Chest pain 05/03/2015   Dyspnea 05/03/2015   ALLERGIC RHINITIS 09/21/2007   G E R D 09/21/2007   SMOKE INHALATION 09/21/2007   OSTEOPOROSIS 01/28/2007   PALPITATIONS 01/28/2007   COUGH 01/28/2007   ALLERGY 01/28/2007    PCP: Janey Santos, MD  REFERRING PROVIDER: Gretta Bertrum ORN, PA-C  REFERRING DIAG:  [phone] (ICD-10-CM) - Closed subcapital fracture of neck of femur, right, initial encounter (HCC)  M25.551 (ICD-10-CM) - Pain in right hip    THERAPY DIAG:  Difficulty in walking, not elsewhere classified  Muscle weakness (generalized)  Unsteadiness on feet  Pain of both hip joints  Repeated falls  History of falling  Balance disorder  Rationale for Evaluation and Treatment: Rehabilitation  ONSET DATE: ~1 month ago  SUBJECTIVE:   SUBJECTIVE STATEMENT: Pt states minimal pain in back and hip.  Feels his endurance is low which affects his transfer ability.  PERTINENT HISTORY:  R THA June 2024, Hx of dementia, anxiety, OA, osteoporosis, BPH, skin cancer, PSVT, chronic thrombocytopenia, shingles,   PAIN:  PRS scale: 1/10  Pain location: posterior hips bilateral  Pain description: aching at rest but can be sharp  Aggravating factors: Sometimes getting out of bed, sitting to standing Relieving factors: Heat, pain medication   PRECAUTIONS: From ortho H&P: Weight-bear as tolerated with a walker. He is able to ambulate for exercise. No abduction of the left hip.  RED FLAGS: None   WEIGHT BEARING RESTRICTIONS: WBAT  FALLS:  Has patient fallen in last 6 months? Yes. Number of falls 2 -- At [employer] and lost balance while walking ~1 month ago  LIVING ENVIRONMENT: Lives with: lives with their spouse Lives in:  House/apartment Stairs: Ramped and level Has following equipment at home: Environmental consultant - 2 wheeled, Wheelchair (manual), and urinal and BSC, grab bars in bathroom but currently not using shower  OCCUPATION: Retired  PLOF: Independent with household mobility with device and Needs assistance with ADLs  PATIENT GOALS: Return to walking and get in/out of chair with less pain, stand  NEXT MD VISIT: n/a  OBJECTIVE:  Note: Objective measures were completed at Evaluation unless otherwise noted.  DIAGNOSTIC FINDINGS: 1. Minimally displaced fracture through the base of the greater trochanter. No intertrochanteric extension is identified but fractures can be occult on CT, particularly in the elderly. If there is concern for further fracture, MRI without contrast is the best test for evaluation. 2. Mild left hip osteoarthritis. 3. Large stool ball in the rectosigmoid colon.  PATIENT SURVEYS:  The Patient-Specific Functional Scale   Eval:   Standing 4   2.   Walking  2   3.   Transfers 6  4.   Stairs 0   AVERAGE 3      COGNITION: 09/04/2023 Overall cognitive status: Within functional limits for tasks assessed     SENSATION: 09/04/2023 [facility]  EDEMA:  09/04/2023 Mild edema in R>L  MUSCLE LENGTH: 09/04/2023 Hamstrings: see knee ROM Thomas test: limited bilat  POSTURE: 09/04/2023  increased thoracic kyphosis  PALPATION: 09/04/2023 Did not assess  LOWER EXTREMITY ROM:  Active ROM Right Eval 09/04/2023 Left Eval 09/04/2023  Hip flexion    Hip extension    Hip abduction    Hip adduction    Hip internal rotation    Hip external rotation    Knee flexion    Knee extension -15 -30  Ankle dorsiflexion    Ankle plantarflexion    Ankle inversion    Ankle eversion     (Blank rows = not tested)  LOWER EXTREMITY MMT:  MMT Right Eval 09/04/2023 Left Eval 09/04/2023  Hip flexion 4* 4*  Hip extension    Hip abduction 3+ Did not assess due to precautions  Hip adduction 4+ 4  Hip  internal rotation    Hip external rotation    Knee flexion 4+ 4+  Knee extension 4- * 4- pulls in back  Ankle dorsiflexion 4 4  Ankle plantarflexion 4- 4-  Ankle inversion    Ankle eversion     (Blank rows = not tested, * = pain)  TRANSFERS:  09/04/2023  Sit<>stand: min A with RW for stability, pt using UEs to come to full standing  Stand pivot t/f: CGA with RW  Sit<>sup: min A for L LE management  FUNCTIONAL TESTS:  09/04/2023 TUG: 2 min 2 sec  GAIT: 09/22/2023:  FWW ambulation c reduced step lengths, shuffling gait, forward trunk lean.  Performed < 100 ft.   09/04/2023 Distance walked: 10' Assistive device utilized: Environmental consultant - 2 wheeled Level of assistance: CGA Comments: Antalgic, step to pattern leading with L LE, very diminished bilat                     TREATMENT   10/15/23 There Ex: Nustep Lvl 5 10 mins  UE/LE for ROM Seated yellow small ball hip adductor isometric squeeze bilateral 5 sec hold x 10  - no back rest Seated isometric bilateral hip abduction 5 sec hold x 10 with use of belt around knees to prevent hip abduction range.  No back rest Seated hamstring stretching manual Seated gastroc stretching with strap and PPT assist Seated hip flexion in WC for back support  TherActivity Pivot transfer education and trial x 2 each direction from wheelchair to seat with verbal cues for techniques, foot placement and performance to help improve transfer ability at home.   Seated ball reach activity including forward and to the side with no back support to work on core control and rotational motions to assist with ADLs and transfers            DATE: 10/05/2023 Therex: Nustep Lvl 5 10 mins  UE/LE for ROM Seated yellow small ball hip adductor isometric squeeze bilateral 5 sec hold x 10  - no back rest Seated isometric bilateral hip abduction 5 sec holld x 10 with use of belt around knees to prevent hip abduction range.  No back rest  Neuro Re-ed (balance control, stability,  postural improvements) BIG inspired stepping with CGA in // bars x 10 bilateral   TherActivity Pivot transfer education and trial x 2 each direction from wheelchair to seat with verbal  cues for techniques, foot placement and performance to help improve transfer ability at home.  Standing retro step with lateral step for transfers with CGA to min A at times x 10 bilateral  Seated bilateral UE reach with thoracic extension with yellow un weighted ball Rollator ambulation with CGA with occasional to moderate cues for step length improvements, Performed 50 ft.   TREATMENT        DATE: 10/01/2023 Therex: Nustep Lvl 5 x 6 min UE/LE for ROM Seated hamstring stretch 2x 30 Seated calf stretch with strap 2x30  TherAct: Reach fwd sit<>stand 2x5 Standing overhead reach for trunk/hip extension x10 with chair support Standing side reach for thoracic extension/rotation x10 with chair support  Gait Training Standing heel strike fwd and bwd 2x10 R&L with chair support FWW with CGA with consistent verbal cues throughout for RW negotiation/foot placement, heel strike, increased step length Performed 100 ft, 130 ft, 30 ft   TREATMENT        DATE: 09/29/2023 Therex: Nustep Lvl 5 5 mins x 2  mins UE/LE for ROM Seated marching to 6 inch cones 3 anterior x 5 each, performed bilaterally.  Seated yellow small ball hip adductor isometric squeeze bilateral 5 sec hold x 10  Seated isometric bilateral hip abduction 5 sec holld x 10 with use of belt around knees to prevent hip abduction range.   Neuro Re-ed (balance control, stability, postural improvements) BIG inspired stepping with CGA in // bars x 5 each side forward, x 6 each side with UE flexion in movement BIG inspired retro stepping with CGA in // bars x 10 bilateral    Gait Training FWW with CGA with consistent verbal cues throughout for increased foot clearing, step length.  Focused on improvement in walker placement for safety as well as increasing  step length.   Performed 100 ft, 130 ft, 30 ft  TREATMENT        DATE: 09/22/2023 Therex: Nustep Lvl 5 8 mins UE/LE for ROM Seated yellow small ball hip adductor isometric squeeze bilateral 5 sec hold x 10  Seated isometric bilateral hip abduction 5 sec holld x 10 with use of belt around knees to prevent hip abduction range.   Neuro Re-ed (balance control, stability, postural improvements) Standing balance no UE assist 1 min c CGA to min A at times to prevent posterior loss of balance. Cues for performance.   TherActivity (to improve functional movement patterns)  Seated back supported leg lift to 6 inch cones 3 anteriorly x 5 each bilaterally to help improve foot clearance in ambulation, transfers.  Standing with FWW with leg lift to 6 inch cones 3 anteriorly x 3 each bilaterally with CGA to help improve foot clearance in ambulation and transfers.   Transfer education and performance with wheelchair to chair transfer to Rt with cues for hand placement, use of arm rests not walker in movement pattern.  Standard sit to stand to sit transfers performed throughout intervention c cues for hand placement and movement pattern cues to reduce use of walker in movement for safety.    Gait Training FWW with CGA with consistent verbal cues throughout with visual cues of colored bands on walker for foot placement.  Focused on improvement in walker placement for safety as well as increasing step length.  Initial ambulation with step to gait pattern then progressed into step through.  Performed 100 ft, 30 ft.     PATIENT EDUCATION:  Education details: Exam findings, POC, initial HEP, importance of weightbearing  Person educated: Patient and Caregiver   Education method: Explanation, Demonstration, Verbal cues, and Handouts Education comprehension: verbalized understanding, returned demonstration, and needs further education  HOME EXERCISE PROGRAM: Access Code: [email or web address] URL:  [email or web address] Date: 09/04/2023 Prepared by: Gellen April Earnie Starring  Exercises - Supine Heel Slide  - 1 x daily - 7 x weekly - 1 sets - 10 reps - Small Range Straight Leg Raise  - 1 x daily - 7 x weekly - 1 sets - 10 reps - Proper Sit to Stand Technique  - 10 x daily - 7 x weekly - 1 sets - 2-3 reps - Supine Bridge with Resistance Band  - 1 x daily - 7 x weekly - 1 sets - 10 reps  ASSESSMENT:  CLINICAL IMPRESSION: Pt needed VC for foot placement with pivot transfer.  Demonstrated understanding.  OBJECTIVE IMPAIRMENTS: Abnormal gait, decreased activity tolerance, decreased balance, decreased coordination, decreased endurance, decreased mobility, difficulty walking, decreased ROM, decreased strength, hypomobility, increased fascial restrictions, impaired flexibility, improper body mechanics, postural dysfunction, and pain.   ACTIVITY LIMITATIONS: carrying, lifting, bending, standing, squatting, stairs, transfers, bed mobility, bathing, toileting, dressing, hygiene/grooming, and locomotion level  PARTICIPATION LIMITATIONS: cleaning, interpersonal relationship, and community activity  PERSONAL FACTORS: Age, Fitness, Past/current experiences, Time since onset of injury/illness/exacerbation, and 1 comorbidity: R THA are also affecting patient's functional outcome.   REHAB POTENTIAL: Good  CLINICAL DECISION MAKING: Evolving/moderate complexity  EVALUATION COMPLEXITY: Moderate   GOALS: Goals reviewed with patient? Yes  SHORT TERM GOALS: Target date: 10/09/2023  Pt will be ind with initial HEP Baseline: Goal status: on going 09/22/2023  2.  Pt will be able to perform 5x STS without assist Baseline:  Goal status: on going 09/22/2023  3.  Pt will be able to amb at least 50' with RW SBA for home mobility Baseline:  Goal status: on going 09/22/2023   LONG TERM GOALS: Target date: 11/13/2023   Pt will be ind with management and progression of HEP Baseline:   Goal status: INITIAL  2.  Pt will improve TUG to </=1'30 to demo improved gait pattern Baseline:  Goal status: INITIAL  3.  Pt will be able to amb at least 150' with RW SBA for safe home amb Baseline:  Goal status: INITIAL  4.  Pt will have improved PSFS average score to >/=5 to demo MCID Baseline:  Goal status: INITIAL  5.  Pt will be able to perform all transfers mod I for home to reduce caregiver burden Baseline:  Goal status: INITIAL  PLAN:  PT FREQUENCY: 2x/week  PT DURATION: 10 weeks  PLANNED INTERVENTIONS: [id number]- PT Re-evaluation, 97750- Physical Performance Testing, 97110-Therapeutic exercises, 97530- Therapeutic activity, [id number]- Neuromuscular re-education, 97535- Self Care, [id number]- Manual therapy, [id number]- Gait training, [phone]- Aquatic Therapy, [phone]- Electrical stimulation (unattended), 97016- Vasopneumatic device, [id number]- Ultrasound, [id number]- Ionotophoresis 4mg /ml Dexamethasone , 79439 (1-2 muscles), 20561 (3+ muscles)- Dry Needling, Patient/Family education, Balance training, Stair training, Taping, Joint mobilization, Cryotherapy, and Moist heat  PLAN FOR NEXT SESSION: Continue functional tranfers, core strengthening, balance improvements.    Burnard Meth, PT 10/15/23  4:29 PM

## 2023-10-15 ENCOUNTER — Ambulatory Visit

## 2023-10-15 DIAGNOSIS — M25552 Pain in left hip: Secondary | ICD-10-CM

## 2023-10-15 DIAGNOSIS — M5459 Other low back pain: Secondary | ICD-10-CM

## 2023-10-15 DIAGNOSIS — M6281 Muscle weakness (generalized): Secondary | ICD-10-CM | POA: Diagnosis not present

## 2023-10-15 DIAGNOSIS — R2689 Other abnormalities of gait and mobility: Secondary | ICD-10-CM

## 2023-10-15 DIAGNOSIS — R2681 Unsteadiness on feet: Secondary | ICD-10-CM

## 2023-10-15 DIAGNOSIS — M25551 Pain in right hip: Secondary | ICD-10-CM | POA: Diagnosis not present

## 2023-10-15 DIAGNOSIS — R262 Difficulty in walking, not elsewhere classified: Secondary | ICD-10-CM | POA: Diagnosis not present

## 2023-10-15 DIAGNOSIS — R296 Repeated falls: Secondary | ICD-10-CM

## 2023-10-15 DIAGNOSIS — Z9181 History of falling: Secondary | ICD-10-CM

## 2023-10-29 ENCOUNTER — Encounter: Payer: Self-pay | Admitting: Rehabilitative and Restorative Service Providers"

## 2023-10-29 ENCOUNTER — Ambulatory Visit (INDEPENDENT_AMBULATORY_CARE_PROVIDER_SITE_OTHER): Admitting: Rehabilitative and Restorative Service Providers"

## 2023-10-29 DIAGNOSIS — M25551 Pain in right hip: Secondary | ICD-10-CM

## 2023-10-29 DIAGNOSIS — M6281 Muscle weakness (generalized): Secondary | ICD-10-CM

## 2023-10-29 DIAGNOSIS — M25552 Pain in left hip: Secondary | ICD-10-CM

## 2023-10-29 DIAGNOSIS — R262 Difficulty in walking, not elsewhere classified: Secondary | ICD-10-CM

## 2023-10-29 DIAGNOSIS — R2681 Unsteadiness on feet: Secondary | ICD-10-CM

## 2023-10-29 DIAGNOSIS — R296 Repeated falls: Secondary | ICD-10-CM

## 2023-10-29 NOTE — Therapy (Addendum)
 OUTPATIENT PHYSICAL THERAPY TREATMENT / DISCHARGE   Patient Name: Lee Nelson MRN: [id number] DOB:[birth year], 83 y.o., male Today's Date: 10/29/2023  END OF SESSION:  PT End of Session - 10/29/23 1449     Visit Number 7    Number of Visits 20    Date for PT Re-Evaluation 11/13/23    Authorization Type Medicare    Progress Note Due on Visit 10    PT Start Time 1441    PT Stop Time 1519    PT Time Calculation (min) 38 min    Activity Tolerance Patient tolerated treatment well    Behavior During Therapy WFL for tasks assessed/performed               Past Medical History:  Diagnosis Date   Anxiety    Arthritis    Bladder calculus    BPH (benign prostatic hyperplasia)    Chronic constipation    Complication of anesthesia     I had some coughing afterwards for a couple of days--  per pt perfers spinal anesthesia since general anesthesia congnitive issues when older   Diverticulosis of colon    Dry eye syndrome of both eyes    Environmental allergies    GERD (gastroesophageal reflux disease)    occasional   History of adenomatous polyp of colon    08/ 2004   History of kidney stones    History of squamous cell carcinoma in situ (SCCIS) of skin    s/p  excision 2013 facial areas and 06/ 2016 nose   Migraine    eye migraine occasional   Seasonal and perennial allergic rhinitis    Thrombocytopenia (HCC)    Tingling    hands and feet bilat , intermittantly-- per pt has lumbar bulging disk   Urinary frequency    Vocal fold atrophy    dysphonia-- per pt has to drink large amount of water  to take even on pill   Wears glasses    Past Surgical History:  Procedure Laterality Date   APPENDECTOMY  child   CARDIOVASCULAR STRESS TEST  05-17-2015  dr hilty   Low risk nuclear study w/ no ischemia/  normal LV function and wall motion , stress ef 54% (lvef 45-54%)   CHOLECYSTECTOMY N/A 09/29/2014   Procedure: LAPAROSCOPIC CHOLECYSTECTOMY WITH INTRAOPERATIVE  CHOLANGIOGRAM;  Surgeon: Alm Angle, MD;  Location: [facility];  Service: General;  Laterality: N/A;   COLONOSCOPY  last one 09-06-2010   ESOPHAGOGASTRODUODENOSCOPY N/A 12/09/2013   Procedure: ESOPHAGOGASTRODUODENOSCOPY (EGD);  Surgeon: Elsie Cree, MD;  Location: [facility];  Service: Endoscopy;  Laterality: N/A;   EXTRACORPOREAL SHOCK WAVE LITHOTRIPSY  yrs ago   INGUINAL HERNIA REPAIR Left child   inguinal hernia repair  09/2017   NISSEN FUNDOPLICATION  1980's   open   STONE EXTRACTION WITH BASKET N/A 08/18/2016   Procedure: STONE EXTRACTION WITH BASKET;  Surgeon: Chales Idol, MD;  Location: [facility];  Service: Urology;  Laterality: N/A;   THULIUM LASER TURP (TRANSURETHRAL RESECTION OF PROSTATE) N/A 08/18/2016   Procedure: THULIUM LASER BLADDER NECK INCISION AND BLADDER STONE REMOVAL;  Surgeon: Chales Idol, MD;  Location: [facility];  Service: Urology;  Laterality: N/A;   TONSILLECTOMY  child   TOTAL HIP ARTHROPLASTY Right 09/07/2022   Procedure: TOTAL HIP ARTHROPLASTY ANTERIOR APPROACH;  Surgeon: Vernetta Lonni GRADE, MD;  Location: [facility];  Service: Orthopedics;  Laterality: Right;   TRANSTHORACIC ECHOCARDIOGRAM  11/18/2010   grade 1 diastolic dysfunction, ef 55-60%/  trivial MR and TR/ mild dilated RA   Patient Active Problem List   Diagnosis Date Noted   Status post total replacement of right hip 09/22/2022   Protein-calorie malnutrition, severe 09/08/2022   Hip fracture (HCC) 09/07/2022   BPH (benign prostatic hyperplasia) 09/07/2022   Closed fracture of right femur, unspecified fracture morphology, initial encounter (HCC) 09/07/2022   Fall 09/07/2022   Closed subcapital fracture of neck of femur, right, initial encounter (HCC) 09/07/2022   Memory impairment 08/06/2022   Pneumonia due to COVID-19 virus 05/06/2022   Severe sepsis (HCC) 05/06/2022   Acute hypoxemic respiratory failure (HCC) 05/06/2022   NPH (normal pressure  hydrocephalus) (HCC) 05/06/2022   Physical deconditioning 05/06/2022   Thrombocytopenia (HCC) 05/06/2022   Dementia (HCC) 05/06/2022   SBO (small bowel obstruction) (HCC) 12/31/2019   Amaurosis fugax of right eye 08/25/2017   PSVT (paroxysmal supraventricular tachycardia) (HCC) 09/12/2015   Chest pain 05/03/2015   Dyspnea 05/03/2015   ALLERGIC RHINITIS 09/21/2007   G E R D 09/21/2007   SMOKE INHALATION 09/21/2007   OSTEOPOROSIS 01/28/2007   PALPITATIONS 01/28/2007   COUGH 01/28/2007   ALLERGY 01/28/2007    PCP: Janey Santos, MD  REFERRING PROVIDER: Gretta Bertrum ORN, PA-C  REFERRING DIAG:  [phone] (ICD-10-CM) - Closed subcapital fracture of neck of femur, right, initial encounter (HCC)  M25.551 (ICD-10-CM) - Pain in right hip    THERAPY DIAG:  Difficulty in walking, not elsewhere classified  Muscle weakness (generalized)  Unsteadiness on feet  Repeated falls  Pain of both hip joints  Rationale for Evaluation and Treatment: Rehabilitation  ONSET DATE: ~1 month ago  SUBJECTIVE:   SUBJECTIVE STATEMENT: Pt indicated having various complaints intermittent pain in back/Rt hip at times.  Reported having some today.    PERTINENT HISTORY:  R THA June 2024, Hx of dementia, anxiety, OA, osteoporosis, BPH, skin cancer, PSVT, chronic thrombocytopenia, shingles,   PAIN:  PRS scale: moderate Pain location: posterior Rt hip Pain description: aching at rest but can be sharp  Aggravating factors: Sometimes getting out of bed, sitting to standing Relieving factors: Heat, pain medication   PRECAUTIONS: From ortho H&P: Weight-bear as tolerated with a walker. He is able to ambulate for exercise. No abduction of the left hip.  RED FLAGS: None   WEIGHT BEARING RESTRICTIONS: WBAT  FALLS:  Has patient fallen in last 6 months? Yes. Number of falls 2 -- At [employer] and lost balance while walking ~1 month ago  LIVING ENVIRONMENT: Lives with: lives with their  spouse Lives in: House/apartment Stairs: Ramped and level Has following equipment at home: Environmental consultant - 2 wheeled, Wheelchair (manual), and urinal and BSC, grab bars in bathroom but currently not using shower  OCCUPATION: Retired  PLOF: Independent with household mobility with device and Needs assistance with ADLs  PATIENT GOALS: Return to walking and get in/out of chair with less pain, stand  NEXT MD VISIT: n/a  OBJECTIVE:  Note: Objective measures were completed at Evaluation unless otherwise noted.  DIAGNOSTIC FINDINGS: 1. Minimally displaced fracture through the base of the greater trochanter. No intertrochanteric extension is identified but fractures can be occult on CT, particularly in the elderly. If there is concern for further fracture, MRI without contrast is the best test for evaluation. 2. Mild left hip osteoarthritis. 3. Large stool ball in the rectosigmoid colon.  PATIENT SURVEYS:  The Patient-Specific Functional Scale   Eval:   Standing 4   2.   Walking  2   3.  Transfers 6   4.   Stairs 0   AVERAGE 3      COGNITION: 09/04/2023 Overall cognitive status: Within functional limits for tasks assessed     SENSATION: 09/04/2023 [facility]  EDEMA:  09/04/2023 Mild edema in R>L  MUSCLE LENGTH: 09/04/2023 Hamstrings: see knee ROM Thomas test: limited bilat  POSTURE: 09/04/2023  increased thoracic kyphosis  PALPATION: 09/04/2023 Did not assess  LOWER EXTREMITY ROM:  Active ROM Right Eval 09/04/2023 Left Eval 09/04/2023  Hip flexion    Hip extension    Hip abduction    Hip adduction    Hip internal rotation    Hip external rotation    Knee flexion    Knee extension -15 -30  Ankle dorsiflexion    Ankle plantarflexion    Ankle inversion    Ankle eversion     (Blank rows = not tested)  LOWER EXTREMITY MMT:  MMT Right Eval 09/04/2023 Left Eval 09/04/2023 Right 10/29/2023 Left 10/29/2023  Hip flexion 4* 4* 5/5 c anterior hip pain 5/5  Hip extension      Hip  abduction 3+ Did not assess due to precautions    Hip adduction 4+ 4    Hip internal rotation      Hip external rotation      Knee flexion 4+ 4+ 5/5 5/5  Knee extension 4- * 4- pulls in back 4+/5 4+/5  Ankle dorsiflexion 4 4    Ankle plantarflexion 4- 4-    Ankle inversion      Ankle eversion       (Blank rows = not tested, * = pain)  TRANSFERS:  09/04/2023  Sit<>stand: min A with RW for stability, pt using UEs to come to full standing  Stand pivot t/f: CGA with RW  Sit<>sup: min A for L LE management  FUNCTIONAL TESTS:  09/04/2023 TUG: 2 min 2 sec  GAIT: 09/22/2023:  FWW ambulation c reduced step lengths, shuffling gait, forward trunk lean.  Performed < 100 ft.   09/04/2023 Distance walked: 10' Assistive device utilized: Environmental consultant - 2 wheeled Level of assistance: CGA Comments: Antalgic, step to pattern leading with L LE, very diminished bilat                     TREATMENT        DATE: 10/29/2023 Therex: Nustep Lvl 6 8 mins UE/LE.  Standing heel/toe lifts with single hand on walker with CGA x 10 Review of sitting based HEP including seated LAQ bilateral, seated marching, seated heel/toe lifts and seated ball squeezes.  Demo of a few reps of each.    TherActivity Pivot transfer education and trial x 1 each direction c cues for placement of hands, sequencing.  SBA required.  Ambulation c FWW up ramp/down ramp with CGA with constant cues for placement of walker, step length and positioning.  Constant cues in all sit to stand to sit transfers between exercises to improve safety with hand placement, limiting pulling on walker in transfer movmeent.  Standing without assistance 1 min with feet hip width apart.  SBA.   Gait Training Ambulation with CGA with FWW with constant cues verbally and visually with band on walker to encourage increased step lengths.  Distances of household 50 ft, 100 ft, 50 ft.     TREATMENT        DATE:10/15/23 There Ex: Nustep Lvl 5 10 mins  UE/LE for  ROM Seated yellow small ball hip adductor isometric squeeze bilateral 5 sec hold  x 10  - no back rest Seated isometric bilateral hip abduction 5 sec hold x 10 with use of belt around knees to prevent hip abduction range.  No back rest Seated hamstring stretching manual Seated gastroc stretching with strap and PPT assist Seated hip flexion in WC for back support  TherActivity Pivot transfer education and trial x 2 each direction from wheelchair to seat with verbal cues for techniques, foot placement and performance to help improve transfer ability at home.   Seated ball reach activity including forward and to the side with no back support to work on core control and rotational motions to assist with ADLs and transfers   TREATMENT        DATE: 10/05/2023 Therex: Nustep Lvl 5 10 mins  UE/LE for ROM Seated yellow small ball hip adductor isometric squeeze bilateral 5 sec hold x 10  - no back rest Seated isometric bilateral hip abduction 5 sec holld x 10 with use of belt around knees to prevent hip abduction range.  No back rest  Neuro Re-ed (balance control, stability, postural improvements) BIG inspired stepping with CGA in // bars x 10 bilateral   TherActivity Pivot transfer education and trial x 2 each direction from wheelchair to seat with verbal cues for techniques, foot placement and performance to help improve transfer ability at home.  Standing retro step with lateral step for transfers with CGA to min A at times x 10 bilateral  Seated bilateral UE reach with thoracic extension with yellow un weighted ball Rollator ambulation with CGA with occasional to moderate cues for step length improvements, Performed 50 ft.   TREATMENT        DATE: 10/01/2023 Therex: Nustep Lvl 5 x 6 min UE/LE for ROM Seated hamstring stretch 2x 30 Seated calf stretch with strap 2x30  TherAct: Reach fwd sit<>stand 2x5 Standing overhead reach for trunk/hip extension x10 with chair support Standing side  reach for thoracic extension/rotation x10 with chair support  Gait Training Standing heel strike fwd and bwd 2x10 R&L with chair support FWW with CGA with consistent verbal cues throughout for RW negotiation/foot placement, heel strike, increased step length Performed 100 ft, 130 ft, 30 ft   TREATMENT        DATE: 09/29/2023 Therex: Nustep Lvl 5 5 mins x 2  mins UE/LE for ROM Seated marching to 6 inch cones 3 anterior x 5 each, performed bilaterally.  Seated yellow small ball hip adductor isometric squeeze bilateral 5 sec hold x 10  Seated isometric bilateral hip abduction 5 sec holld x 10 with use of belt around knees to prevent hip abduction range.   Neuro Re-ed (balance control, stability, postural improvements) BIG inspired stepping with CGA in // bars x 5 each side forward, x 6 each side with UE flexion in movement BIG inspired retro stepping with CGA in // bars x 10 bilateral    Gait Training FWW with CGA with consistent verbal cues throughout for increased foot clearing, step length.  Focused on improvement in walker placement for safety as well as increasing step length.   Performed 100 ft, 130 ft, 30 ft  TREATMENT        DATE: 09/22/2023 Therex: Nustep Lvl 5 8 mins UE/LE for ROM Seated yellow small ball hip adductor isometric squeeze bilateral 5 sec hold x 10  Seated isometric bilateral hip abduction 5 sec holld x 10 with use of belt around knees to prevent hip abduction range.   Neuro Re-ed (balance control, stability,  postural improvements) Standing balance no UE assist 1 min c CGA to min A at times to prevent posterior loss of balance. Cues for performance.   TherActivity (to improve functional movement patterns)  Seated back supported leg lift to 6 inch cones 3 anteriorly x 5 each bilaterally to help improve foot clearance in ambulation, transfers.  Standing with FWW with leg lift to 6 inch cones 3 anteriorly x 3 each bilaterally with CGA to help improve foot clearance in  ambulation and transfers.   Transfer education and performance with wheelchair to chair transfer to Rt with cues for hand placement, use of arm rests not walker in movement pattern.  Standard sit to stand to sit transfers performed throughout intervention c cues for hand placement and movement pattern cues to reduce use of walker in movement for safety.    Gait Training FWW with CGA with consistent verbal cues throughout with visual cues of colored bands on walker for foot placement.  Focused on improvement in walker placement for safety as well as increasing step length.  Initial ambulation with step to gait pattern then progressed into step through.  Performed 100 ft, 30 ft.     PATIENT EDUCATION:  Education details: Exam findings, POC, initial HEP, importance of weightbearing Person educated: Patient and Caregiver   Education method: Explanation, Demonstration, Verbal cues, and Handouts Education comprehension: verbalized understanding, returned demonstration, and needs further education  HOME EXERCISE PROGRAM: Access Code: [email or web address] URL: [email or web address] Date: 10/29/2023 Prepared by: Ozell Silvan  Exercises - Supine Heel Slide  - 1 x daily - 7 x weekly - 1 sets - 10 reps - Small Range Straight Leg Raise  - 1 x daily - 7 x weekly - 1 sets - 10 reps - Proper Sit to Stand Technique  - 10 x daily - 7 x weekly - 1 sets - 2-3 reps - Supine Bridge with Resistance Band  - 1 x daily - 7 x weekly - 1 sets - 10 reps - Seated Long Arc Quad  - 1-2 x daily - 7 x weekly - 1-2 sets - 10 reps - 2 hold - Seated March  - 1-2 x daily - 7 x weekly - 1-2 sets - 10 reps - Seated Heel Toe Raises  - 1-2 x daily - 7 x weekly - 1-2 sets - 10 reps - Seated Hip Adduction Isometrics with Ball  - 1-2 x daily - 7 x weekly - 1-2 sets - 10 reps - 5 hold  ASSESSMENT:  CLINICAL IMPRESSION: Cuing for step length with verbal and visual cues on walker did help improve for several steps but  without cues, reversion back to shuffling gait noted.  Difficulty correcting posterior weight shift noted in standing and in ambulation.  Continued skilled PT services indicated at this time.   OBJECTIVE IMPAIRMENTS: Abnormal gait, decreased activity tolerance, decreased balance, decreased coordination, decreased endurance, decreased mobility, difficulty walking, decreased ROM, decreased strength, hypomobility, increased fascial restrictions, impaired flexibility, improper body mechanics, postural dysfunction, and pain.   ACTIVITY LIMITATIONS: carrying, lifting, bending, standing, squatting, stairs, transfers, bed mobility, bathing, toileting, dressing, hygiene/grooming, and locomotion level  PARTICIPATION LIMITATIONS: cleaning, interpersonal relationship, and community activity  PERSONAL FACTORS: Age, Fitness, Past/current experiences, Time since onset of injury/illness/exacerbation, and 1 comorbidity: R THA are also affecting patient's functional outcome.   REHAB POTENTIAL: Good  CLINICAL DECISION MAKING: Evolving/moderate complexity  EVALUATION COMPLEXITY: Moderate   GOALS: Goals reviewed with patient? Yes  SHORT TERM GOALS: Target date: 10/09/2023  Pt  will be ind with initial HEP Baseline: Goal status: not met  2.  Pt will be able to perform 5x STS without assist Baseline:  Goal status: not met  3.  Pt will be able to amb at least 50' with RW SBA for home mobility Baseline:  Goal status: Met   LONG TERM GOALS: Target date: 11/13/2023   Pt will be ind with management and progression of HEP Baseline:  Goal status: on going 10/29/2023  2.  Pt will improve TUG to </=1'30 to demo improved gait pattern Baseline:  Goal status:  on going 10/29/2023  3.  Pt will be able to amb at least 150' with RW SBA for safe home amb Baseline:  Goal status:  on going 10/29/2023  4.  Pt will have improved PSFS average score to >/=5 to demo MCID Baseline:  Goal status:  on going  10/29/2023  5.  Pt will be able to perform all transfers mod I for home to reduce caregiver burden Baseline:  Goal status:  on going 10/29/2023  PLAN:  PT FREQUENCY: 2x/week  PT DURATION: 10 weeks  PLANNED INTERVENTIONS: 97164- PT Re-evaluation, 97750- Physical Performance Testing, 97110-Therapeutic exercises, 97530- Therapeutic activity, 97112- Neuromuscular re-education, 97535- Self Care, 02859- Manual therapy, [id number]- Gait training, [phone]- Aquatic Therapy, [phone]- Electrical stimulation (unattended), 97016- Vasopneumatic device, [id number]- Ultrasound, [id number]- Ionotophoresis 4mg /ml Dexamethasone , 79439 (1-2 muscles), 20561 (3+ muscles)- Dry Needling, Patient/Family education, Balance training, Stair training, Taping, Joint mobilization, Cryotherapy, and Moist heat  PLAN FOR NEXT SESSION:  Continued mobility improvements for safety.  Functional strengthening.  Possible bands added to walker for step length cues.   Ozell Silvan, PT, DPT, OCS, ATC 10/29/23  3:28 PM   PHYSICAL THERAPY DISCHARGE SUMMARY  Visits from Start of Care: 7  Current functional level related to goals / functional outcomes: See note   Remaining deficits: See note   Education / Equipment: HEP  Patient goals were partially met. Patient is being discharged due to not returning since the last visit. (Fall was reported per chart review) Ozell Silvan, PT, DPT, OCS, ATC 12/28/23  9:07 AM

## 2023-11-02 ENCOUNTER — Other Ambulatory Visit (INDEPENDENT_AMBULATORY_CARE_PROVIDER_SITE_OTHER): Payer: Self-pay

## 2023-11-02 ENCOUNTER — Ambulatory Visit: Admitting: Surgical

## 2023-11-02 ENCOUNTER — Encounter: Payer: Self-pay | Admitting: Surgical

## 2023-11-02 ENCOUNTER — Encounter: Admitting: Physical Therapy

## 2023-11-02 DIAGNOSIS — M545 Low back pain, unspecified: Secondary | ICD-10-CM

## 2023-11-02 DIAGNOSIS — M25512 Pain in left shoulder: Secondary | ICD-10-CM

## 2023-11-02 MED ORDER — HYDROCODONE-ACETAMINOPHEN 5-325 MG PO TABS: 0.5000 | ORAL_TABLET | Freq: Every evening | ORAL | 0 refills | Status: DC | PRN

## 2023-11-05 ENCOUNTER — Encounter

## 2023-11-06 ENCOUNTER — Telehealth: Payer: Self-pay | Admitting: Surgical

## 2023-11-06 DIAGNOSIS — M533 Sacrococcygeal disorders, not elsewhere classified: Secondary | ICD-10-CM

## 2023-11-06 NOTE — Telephone Encounter (Signed)
 Felicia Winnex from the facility where the pt lives. Felicia called stating pt Scan need to be moved to an open MRI scan at [facility]. She states pt is unable to get on the table and need open MRI scan so pt can get help to move. Please call pt about this matter at [phone].

## 2023-11-06 NOTE — Telephone Encounter (Signed)
 I called Lee Nelson.  Patient having increased pain.  Can we order sacral MRI to evaluate for occult fracture versus insufficiency fracture?  Also can we start the process for ordering a hospital bed since he is requesting this and he is struggling with a regular bed.

## 2023-11-06 NOTE — Telephone Encounter (Signed)
 Order changed to [facility].

## 2023-11-06 NOTE — Addendum Note (Signed)
 Addended by: TRINDA DEANE HERO on: 11/06/2023 03:02 PM   Modules accepted: Orders

## 2023-11-06 NOTE — Telephone Encounter (Signed)
 Felica called. She is working with patient in home. Says he is still having a lot of pain. What does Herlene want him to do? [phone]

## 2023-11-06 NOTE — Telephone Encounter (Signed)
MRI order entered.

## 2023-11-08 ENCOUNTER — Encounter: Payer: Self-pay | Admitting: Surgical

## 2023-11-08 NOTE — Progress Notes (Signed)
 Office Visit Note   Patient: Lee Nelson           Date of Birth: 1940/08/25           MRN: [id number] Visit Date: 11/02/2023 Requested by: Janey Santos, MD [address] [place],  [place] [address] PCP: Janey Santos, MD  Subjective: Chief Complaint  Patient presents with   Left Shoulder - Pain    Fall 11/01/2023   Lower Back - Pain    Fall 11/01/2023    HPI: Lee Nelson is a 83 y.o. male who presents to the office reporting low back pain and left shoulder pain.  Patient sustained a fall onto his buttock and left shoulder.  Has pain in the left posterior shoulder and in the left sacral region.  Still ambulatory.  No groin pain.  Staying at assisted living facility.  He is doing regular physical therapy to work on balance and strengthening.  Denies any loss of motion in his shoulder since the fall.  Has not noticed any bruising around his shoulder or back since the fall..                ROS: All systems reviewed are negative as they relate to the chief complaint within the history of present illness.  Patient denies fevers or chills.  Assessment & Plan: Visit Diagnoses:  1. Acute pain of left shoulder   2. Acute bilateral low back pain without sciatica     Plan: Patient is an 83 year old male who presents for evaluation of low back pain, sacral pain, left shoulder pain.  Left shoulder is fairly painless with passive and active motion today.  Radiographs are negative for any left shoulder injury.  His rotator cuff strength is excellent.  Plan to see how his left shoulder does with more time away from injury which was yesterday.  Regarding sacral pain which seems to be the most bothersome thing for him, radiographs are negative for any acute abnormality.  He has had multiple sacral fractures in the past and insufficiency fractures in this region which may be at play here.  With minimal reproducible pain on exam and only 1 day out from injury, plan to see how this goes over  the next few days before any further imaging is ordered.  If he has continued pain or pain that worsens in this region, would probably proceed with MRI of the pelvis for evaluation of sacral fracture.  Follow-Up Instructions: No follow-ups on file.   Orders:  Orders Placed This Encounter  Procedures   XR Shoulder Left   XR Lumbar Spine 2-3 Views   XR Pelvis 1-2 Views   Meds ordered this encounter  Medications   HYDROcodone -acetaminophen  (NORCO/VICODIN) 5-325 MG tablet    Sig: Take 0.5 tablets by mouth at bedtime as needed for moderate pain (pain score 4-6).    Dispense:  10 tablet    Refill:  0      Procedures: No procedures performed   Clinical Data: No additional findings.  Objective: Vital Signs: There were no vitals taken for this visit.  Physical Exam:  Constitutional: Patient appears well-developed HEENT:  Head: Normocephalic Eyes:EOM are normal Neck: Normal range of motion Cardiovascular: Normal rate Pulmonary/chest: Effort normal Neurologic: Patient is alert Skin: Skin is warm Psychiatric: Patient has normal mood and affect  Ortho Exam: Ortho exam demonstrates no pain with hip range of motion bilaterally.  Negative FADIR sign.  Intact hip flexion of the left and right hips.  Intact quad extension, hamstring strength, dorsiflexion, plantarflexion bilaterally.  Sacral region viewed with no ecchymosis or swelling.  There is some mild tenderness in the left proximal aspect of the sacrum but nothing severe.  No tenderness over the coccyx.  No point tenderness over the axial lumbar spine.  No paraspinal muscular tenderness in the lumbar spine region.  Left shoulder with equivalent passive and active range of motion.  Excellent rotator cuff strength of supra, infra, subscap.  No crepitus noted with passive motion of the shoulder.  He has no tenderness over the [facility] joint or bicipital groove.  No tenderness over the scapular body on the left.  There is small abrasion noted  in the posterior lateral shoulder.  No deformity noted to the shoulder girdle.  Intact EPL, FPL, finger abduction, pronation is has imitation, bicep, tricep, deltoid of the left arm.  Axillary nerve intact with deltoid firing.  Specialty Comments:  No specialty comments available.  Imaging: No results found.   PMFS History: Patient Active Problem List   Diagnosis Date Noted   Status post total replacement of right hip 09/22/2022   Protein-calorie malnutrition, severe 09/08/2022   Hip fracture (HCC) 09/07/2022   BPH (benign prostatic hyperplasia) 09/07/2022   Closed fracture of right femur, unspecified fracture morphology, initial encounter (HCC) 09/07/2022   Fall 09/07/2022   Closed subcapital fracture of neck of femur, right, initial encounter (HCC) 09/07/2022   Memory impairment 08/06/2022   Pneumonia due to COVID-19 virus 05/06/2022   Severe sepsis (HCC) 05/06/2022   Acute hypoxemic respiratory failure (HCC) 05/06/2022   NPH (normal pressure hydrocephalus) (HCC) 05/06/2022   Physical deconditioning 05/06/2022   Thrombocytopenia (HCC) 05/06/2022   Dementia (HCC) 05/06/2022   SBO (small bowel obstruction) (HCC) 12/31/2019   Amaurosis fugax of right eye 08/25/2017   PSVT (paroxysmal supraventricular tachycardia) (HCC) 09/12/2015   Chest pain 05/03/2015   Dyspnea 05/03/2015   ALLERGIC RHINITIS 09/21/2007   G E R D 09/21/2007   SMOKE INHALATION 09/21/2007   OSTEOPOROSIS 01/28/2007   PALPITATIONS 01/28/2007   COUGH 01/28/2007   ALLERGY 01/28/2007   Past Medical History:  Diagnosis Date   Anxiety    Arthritis    Bladder calculus    BPH (benign prostatic hyperplasia)    Chronic constipation    Complication of anesthesia     I had some coughing afterwards for a couple of days--  per pt perfers spinal anesthesia since general anesthesia congnitive issues when older   Diverticulosis of colon    Dry eye syndrome of both eyes    Environmental allergies    GERD  (gastroesophageal reflux disease)    occasional   History of adenomatous polyp of colon    08/ 2004   History of kidney stones    History of squamous cell carcinoma in situ (SCCIS) of skin    s/p  excision 2013 facial areas and 06/ 2016 nose   Migraine    eye migraine occasional   Seasonal and perennial allergic rhinitis    Thrombocytopenia (HCC)    Tingling    hands and feet bilat , intermittantly-- per pt has lumbar bulging disk   Urinary frequency    Vocal fold atrophy    dysphonia-- per pt has to drink large amount of water  to take even on pill   Wears glasses     Family History  Problem Relation Age of Onset   Parkinsonism Brother    COPD Mother    Allergies Mother  Heart failure Mother    COPD Father    Stroke Father    Other Sister    Suicidality Maternal Aunt    Cancer Maternal Grandfather     Past Surgical History:  Procedure Laterality Date   APPENDECTOMY  child   CARDIOVASCULAR STRESS TEST  05-17-2015  dr hilty   Low risk nuclear study w/ no ischemia/  normal LV function and wall motion , stress ef 54% (lvef 45-54%)   CHOLECYSTECTOMY N/A 09/29/2014   Procedure: LAPAROSCOPIC CHOLECYSTECTOMY WITH INTRAOPERATIVE CHOLANGIOGRAM;  Surgeon: Alm Angle, MD;  Location: [facility];  Service: General;  Laterality: N/A;   COLONOSCOPY  last one 09-06-2010   ESOPHAGOGASTRODUODENOSCOPY N/A 12/09/2013   Procedure: ESOPHAGOGASTRODUODENOSCOPY (EGD);  Surgeon: Elsie Cree, MD;  Location: [facility];  Service: Endoscopy;  Laterality: N/A;   EXTRACORPOREAL SHOCK WAVE LITHOTRIPSY  yrs ago   INGUINAL HERNIA REPAIR Left child   inguinal hernia repair  09/2017   NISSEN FUNDOPLICATION  1980's   open   STONE EXTRACTION WITH BASKET N/A 08/18/2016   Procedure: STONE EXTRACTION WITH BASKET;  Surgeon: Chales Idol, MD;  Location: [facility];  Service: Urology;  Laterality: N/A;   THULIUM LASER TURP (TRANSURETHRAL RESECTION OF PROSTATE) N/A 08/18/2016   Procedure:  THULIUM LASER BLADDER NECK INCISION AND BLADDER STONE REMOVAL;  Surgeon: Chales Idol, MD;  Location: [facility];  Service: Urology;  Laterality: N/A;   TONSILLECTOMY  child   TOTAL HIP ARTHROPLASTY Right 09/07/2022   Procedure: TOTAL HIP ARTHROPLASTY ANTERIOR APPROACH;  Surgeon: Vernetta Lonni GRADE, MD;  Location: [facility];  Service: Orthopedics;  Laterality: Right;   TRANSTHORACIC ECHOCARDIOGRAM  11/18/2010   grade 1 diastolic dysfunction, ef 55-60%/  trivial MR and TR/ mild dilated RA   Social History   Occupational History   Occupation: teaches constitutional law   Occupation: Photographer: [employer]  Tobacco Use   Smoking status: Never   Smokeless tobacco: Never  Vaping Use   Vaping status: Never Used  Substance and Sexual Activity   Alcohol  use: Yes    Comment: social   Drug use: No   Sexual activity: Not on file

## 2023-11-09 ENCOUNTER — Other Ambulatory Visit: Payer: Self-pay

## 2023-11-09 ENCOUNTER — Encounter (HOSPITAL_COMMUNITY): Payer: Self-pay | Admitting: Emergency Medicine

## 2023-11-09 ENCOUNTER — Emergency Department (HOSPITAL_COMMUNITY)

## 2023-11-09 ENCOUNTER — Emergency Department (HOSPITAL_COMMUNITY)
Admission: EM | Admit: 2023-11-09 | Discharge: 2023-11-09 | Disposition: A | Discharge status: Home / Self Care | Attending: Emergency Medicine | Admitting: Emergency Medicine

## 2023-11-09 ENCOUNTER — Telehealth: Payer: Self-pay | Admitting: Rehabilitative and Restorative Service Providers"

## 2023-11-09 ENCOUNTER — Encounter: Admitting: Rehabilitative and Restorative Service Providers"

## 2023-11-09 DIAGNOSIS — K5730 Diverticulosis of large intestine without perforation or abscess without bleeding: Secondary | ICD-10-CM | POA: Diagnosis not present

## 2023-11-09 DIAGNOSIS — M5459 Other low back pain: Secondary | ICD-10-CM | POA: Insufficient documentation

## 2023-11-09 DIAGNOSIS — M48061 Spinal stenosis, lumbar region without neurogenic claudication: Secondary | ICD-10-CM | POA: Diagnosis not present

## 2023-11-09 DIAGNOSIS — X58XXXD Exposure to other specified factors, subsequent encounter: Secondary | ICD-10-CM | POA: Insufficient documentation

## 2023-11-09 DIAGNOSIS — M545 Low back pain, unspecified: Secondary | ICD-10-CM | POA: Diagnosis present

## 2023-11-09 DIAGNOSIS — M1612 Unilateral primary osteoarthritis, left hip: Secondary | ICD-10-CM | POA: Diagnosis not present

## 2023-11-09 DIAGNOSIS — M51369 Other intervertebral disc degeneration, lumbar region without mention of lumbar back pain or lower extremity pain: Secondary | ICD-10-CM | POA: Insufficient documentation

## 2023-11-09 DIAGNOSIS — I700 Atherosclerosis of aorta: Secondary | ICD-10-CM | POA: Insufficient documentation

## 2023-11-09 DIAGNOSIS — F0390 Unspecified dementia without behavioral disturbance: Secondary | ICD-10-CM | POA: Insufficient documentation

## 2023-11-09 DIAGNOSIS — Z96641 Presence of right artificial hip joint: Secondary | ICD-10-CM | POA: Diagnosis not present

## 2023-11-09 DIAGNOSIS — S72112G Displaced fracture of greater trochanter of left femur, subsequent encounter for closed fracture with delayed healing: Secondary | ICD-10-CM | POA: Insufficient documentation

## 2023-11-09 DIAGNOSIS — Z9181 History of falling: Secondary | ICD-10-CM | POA: Diagnosis present

## 2023-11-09 MED ORDER — ACETAMINOPHEN 500 MG PO TABS
1000.0000 mg | ORAL_TABLET | Freq: Once | ORAL | Status: AC
  Administered 2023-11-09 (×2): 1000 mg via ORAL
  Filled 2023-11-09: qty 2

## 2023-11-09 MED ORDER — HYDROCODONE-ACETAMINOPHEN 5-325 MG PO TABS: 1.0000 | ORAL_TABLET | Freq: Every evening | ORAL | 0 refills | Status: DC | PRN

## 2023-11-09 MED ORDER — OXYCODONE HCL 5 MG PO TABS
5.0000 mg | ORAL_TABLET | Freq: Once | ORAL | Status: AC
  Administered 2023-11-09 (×2): 5 mg via ORAL
  Filled 2023-11-09: qty 1

## 2023-11-09 NOTE — ED Triage Notes (Signed)
 Pt to ED via [facility] from home c/o lower back pain and right hip pain for 1-2days.  Pt had a fall 7 days ago and seen by primary with negative xrays.  Denies new injuries.  Given 0.5 norco tab around 2130.  Hx dementia.

## 2023-11-09 NOTE — ED Notes (Signed)
 Pt assisted into caregiver POV.

## 2023-11-09 NOTE — ED Provider Notes (Signed)
 [facility] EMERGENCY DEPARTMENT AT [facility] Provider Note   CSN: [id number] Arrival date & time: 11/09/23  0231     History Chief Complaint  Patient presents with   Back Pain    HPI Lee Nelson is a 83 y.o. male presenting for chief complaint of fall. Lee Nelson about a week ago.  Having back pain. PCP ordered OP MRI and some pain medications.  Patient's recorded medical, surgical, social, medication list and allergies were reviewed in the Snapshot window as part of the initial history.   Review of Systems   Review of Systems  Constitutional:  Negative for chills and fever.  HENT:  Negative for ear pain and sore throat.   Eyes:  Negative for pain and visual disturbance.  Respiratory:  Negative for cough and shortness of breath.   Cardiovascular:  Negative for chest pain and palpitations.  Gastrointestinal:  Negative for abdominal pain and vomiting.  Genitourinary:  Negative for dysuria and hematuria.  Musculoskeletal:  Positive for back pain. Negative for arthralgias.  Skin:  Negative for color change and rash.  Neurological:  Negative for seizures and syncope.  All other systems reviewed and are negative.   Physical Exam Updated Vital Signs BP 120/89 (BP Location: Left Arm)   Pulse 87   Temp 98 F (36.7 C) (Oral)   Resp 14   Ht 5' 10 (1.778 m)   Wt 51.7 kg   SpO2 98%   BMI 16.36 kg/m  Physical Exam Vitals and nursing note reviewed.  Constitutional:      General: He is not in acute distress.    Appearance: He is well-developed.  HENT:     Head: Normocephalic and atraumatic.  Eyes:     Conjunctiva/sclera: Conjunctivae normal.  Cardiovascular:     Rate and Rhythm: Normal rate and regular rhythm.     Heart sounds: No murmur heard. Pulmonary:     Effort: Pulmonary effort is normal. No respiratory distress.     Breath sounds: Normal breath sounds.  Abdominal:     Palpations: Abdomen is soft.     Tenderness: There is no abdominal tenderness.   Musculoskeletal:        General: No swelling.     Cervical back: Neck supple.  Skin:    General: Skin is warm and dry.     Capillary Refill: Capillary refill takes less than 2 seconds.  Neurological:     Mental Status: He is alert.  Psychiatric:        Mood and Affect: Mood normal.      ED Course/ Medical Decision Making/ A&P    Procedures Procedures   Medications Ordered in ED Medications  acetaminophen  (TYLENOL ) tablet 1,000 mg (1,000 mg Oral Given 11/09/23 0258)  oxyCODONE  (Oxy IR/ROXICODONE ) immediate release tablet 5 mg (5 mg Oral Given 11/09/23 0258)    Medical Decision Making:   This is an 83 year old male presenting with pain after a fall.  Seen by orthopedics had outpatient x-rays that were nondiagnostic, MRIs were ordered. However tonight he comes in for further worsening pain.  His caregiver arrived and states that he is not crying on pain throughout the night tonight so she called EMS and brought in. He states has been taking half of a Vicodin tablet at home as he is worried to take more. States he had a distant history of bowel obstruction and he had heard that opiates could cause constipation which caused him concern. He denies fevers chills nausea vomiting shortness of  breath.  Of substantial pain with any attempted physical exam initially. He consented to administration medications for symptoms. CTs to evaluate for acute fractures MRI is not available overnight. CT showed no acute pathology.  Pain is grossly improved on serial assessment.  We discussed supportive care in the outpatient setting.  Patient's caregiver feels comfortable taking him home for outpatient care management. Disposition:  I have considered need for hospitalization, however, considering all of the above, I believe this patient is stable for discharge at this time.  Patient/family educated about specific return precautions for given chief complaint and symptoms.  Patient/family educated about  follow-up with PCP.     Patient/family expressed understanding of return precautions and need for follow-up. Patient spoken to regarding all imaging and laboratory results and appropriate follow up for these results. All education provided in verbal form with additional information in written form. Time was allowed for answering of patient questions. Patient discharged.    Emergency Department Medication Summary:   Medications  acetaminophen  (TYLENOL ) tablet 1,000 mg (1,000 mg Oral Given 11/09/23 0258)  oxyCODONE  (Oxy IR/ROXICODONE ) immediate release tablet 5 mg (5 mg Oral Given 11/09/23 0258)         Clinical Impression:  1. Low back pain, unspecified back pain laterality, unspecified chronicity, unspecified whether sciatica present   2. Acute left-sided low back pain without sciatica      Discharge   Final Clinical Impression(s) / ED Diagnoses Final diagnoses:  Low back pain, unspecified back pain laterality, unspecified chronicity, unspecified whether sciatica present  Acute left-sided low back pain without sciatica    Rx / DC Orders ED Discharge Orders          Ordered    HYDROcodone -acetaminophen  (NORCO/VICODIN) 5-325 MG tablet  At bedtime PRN        11/09/23 0441              Lee Meth, MD 11/09/23 [phone]

## 2023-11-09 NOTE — Therapy (Incomplete)
 OUTPATIENT PHYSICAL THERAPY TREATMENT   Patient Name: Lee Nelson MRN: [id number] DOB:September 14, 1940, 83 y.o., male Today's Date: 11/09/2023  END OF SESSION:         Past Medical History:  Diagnosis Date   Anxiety    Arthritis    Bladder calculus    BPH (benign prostatic hyperplasia)    Chronic constipation    Complication of anesthesia     I had some coughing afterwards for a couple of days--  per pt perfers spinal anesthesia since general anesthesia congnitive issues when older   Diverticulosis of colon    Dry eye syndrome of both eyes    Environmental allergies    GERD (gastroesophageal reflux disease)    occasional   History of adenomatous polyp of colon    08/ 2004   History of kidney stones    History of squamous cell carcinoma in situ (SCCIS) of skin    s/p  excision 2013 facial areas and 06/ 2016 nose   Migraine    eye migraine occasional   Seasonal and perennial allergic rhinitis    Thrombocytopenia (HCC)    Tingling    hands and feet bilat , intermittantly-- per pt has lumbar bulging disk   Urinary frequency    Vocal fold atrophy    dysphonia-- per pt has to drink large amount of water  to take even on pill   Wears glasses    Past Surgical History:  Procedure Laterality Date   APPENDECTOMY  child   CARDIOVASCULAR STRESS TEST  05-17-2015  dr hilty   Low risk nuclear study w/ no ischemia/  normal LV function and wall motion , stress ef 54% (lvef 45-54%)   CHOLECYSTECTOMY N/A 09/29/2014   Procedure: LAPAROSCOPIC CHOLECYSTECTOMY WITH INTRAOPERATIVE CHOLANGIOGRAM;  Surgeon: Alm Angle, MD;  Location: [facility];  Service: General;  Laterality: N/A;   COLONOSCOPY  last one 09-06-2010   ESOPHAGOGASTRODUODENOSCOPY N/A 12/09/2013   Procedure: ESOPHAGOGASTRODUODENOSCOPY (EGD);  Surgeon: Elsie Cree, MD;  Location: [facility];  Service: Endoscopy;  Laterality: N/A;   EXTRACORPOREAL SHOCK WAVE LITHOTRIPSY  yrs ago   INGUINAL HERNIA REPAIR Left child    inguinal hernia repair  09/2017   NISSEN FUNDOPLICATION  1980's   open   STONE EXTRACTION WITH BASKET N/A 08/18/2016   Procedure: STONE EXTRACTION WITH BASKET;  Surgeon: Chales Idol, MD;  Location: [facility];  Service: Urology;  Laterality: N/A;   THULIUM LASER TURP (TRANSURETHRAL RESECTION OF PROSTATE) N/A 08/18/2016   Procedure: THULIUM LASER BLADDER NECK INCISION AND BLADDER STONE REMOVAL;  Surgeon: Chales Idol, MD;  Location: [facility];  Service: Urology;  Laterality: N/A;   TONSILLECTOMY  child   TOTAL HIP ARTHROPLASTY Right 09/07/2022   Procedure: TOTAL HIP ARTHROPLASTY ANTERIOR APPROACH;  Surgeon: Vernetta Lonni GRADE, MD;  Location: [facility];  Service: Orthopedics;  Laterality: Right;   TRANSTHORACIC ECHOCARDIOGRAM  11/18/2010   grade 1 diastolic dysfunction, ef 55-60%/  trivial MR and TR/ mild dilated RA   Patient Active Problem List   Diagnosis Date Noted   Status post total replacement of right hip 09/22/2022   Protein-calorie malnutrition, severe 09/08/2022   Hip fracture (HCC) 09/07/2022   BPH (benign prostatic hyperplasia) 09/07/2022   Closed fracture of right femur, unspecified fracture morphology, initial encounter (HCC) 09/07/2022   Fall 09/07/2022   Closed subcapital fracture of neck of femur, right, initial encounter (HCC) 09/07/2022   Memory impairment 08/06/2022   Pneumonia due to COVID-19 virus 05/06/2022   Severe  sepsis (HCC) 05/06/2022   Acute hypoxemic respiratory failure (HCC) 05/06/2022   NPH (normal pressure hydrocephalus) (HCC) 05/06/2022   Physical deconditioning 05/06/2022   Thrombocytopenia (HCC) 05/06/2022   Dementia (HCC) 05/06/2022   SBO (small bowel obstruction) (HCC) 12/31/2019   Amaurosis fugax of right eye 08/25/2017   PSVT (paroxysmal supraventricular tachycardia) (HCC) 09/12/2015   Chest pain 05/03/2015   Dyspnea 05/03/2015   ALLERGIC RHINITIS 09/21/2007   G E R D 09/21/2007   SMOKE INHALATION  09/21/2007   OSTEOPOROSIS 01/28/2007   PALPITATIONS 01/28/2007   COUGH 01/28/2007   ALLERGY 01/28/2007    PCP: Janey Santos, MD  REFERRING PROVIDER: Gretta Bertrum ORN, PA-C  REFERRING DIAG:  [phone] (ICD-10-CM) - Closed subcapital fracture of neck of femur, right, initial encounter (HCC)  M25.551 (ICD-10-CM) - Pain in right hip    THERAPY DIAG:  No diagnosis found.  Rationale for Evaluation and Treatment: Rehabilitation  ONSET DATE: ~1 month ago  SUBJECTIVE:   SUBJECTIVE STATEMENT: Pt indicated having various complaints intermittent pain in back/Rt hip at times.  Reported having some today.    PERTINENT HISTORY:  R THA June 2024, Hx of dementia, anxiety, OA, osteoporosis, BPH, skin cancer, PSVT, chronic thrombocytopenia, shingles,   PAIN:  PRS scale: moderate Pain location: posterior Rt hip Pain description: aching at rest but can be sharp  Aggravating factors: Sometimes getting out of bed, sitting to standing Relieving factors: Heat, pain medication   PRECAUTIONS: From ortho H&P: Weight-bear as tolerated with a walker. He is able to ambulate for exercise. No abduction of the left hip.  RED FLAGS: None   WEIGHT BEARING RESTRICTIONS: WBAT  FALLS:  Has patient fallen in last 6 months? Yes. Number of falls 2 -- At [employer] and lost balance while walking ~1 month ago  LIVING ENVIRONMENT: Lives with: lives with their spouse Lives in: House/apartment Stairs: Ramped and level Has following equipment at home: Environmental consultant - 2 wheeled, Wheelchair (manual), and urinal and BSC, grab bars in bathroom but currently not using shower  OCCUPATION: Retired  PLOF: Independent with household mobility with device and Needs assistance with ADLs  PATIENT GOALS: Return to walking and get in/out of chair with less pain, stand  NEXT MD VISIT: n/a  OBJECTIVE:  Note: Objective measures were completed at Evaluation unless otherwise noted.  DIAGNOSTIC FINDINGS: 1. Minimally  displaced fracture through the base of the greater trochanter. No intertrochanteric extension is identified but fractures can be occult on CT, particularly in the elderly. If there is concern for further fracture, MRI without contrast is the best test for evaluation. 2. Mild left hip osteoarthritis. 3. Large stool ball in the rectosigmoid colon.  PATIENT SURVEYS:  The Patient-Specific Functional Scale   Eval:   Standing 4   2.   Walking  2   3.   Transfers 6   4.   Stairs 0   AVERAGE 3      COGNITION: 09/04/2023 Overall cognitive status: Within functional limits for tasks assessed     SENSATION: 09/04/2023 [facility]  EDEMA:  09/04/2023 Mild edema in R>L  MUSCLE LENGTH: 09/04/2023 Hamstrings: see knee ROM Thomas test: limited bilat  POSTURE: 09/04/2023  increased thoracic kyphosis  PALPATION: 09/04/2023 Did not assess  LOWER EXTREMITY ROM:  Active ROM Right Eval 09/04/2023 Left Eval 09/04/2023  Hip flexion    Hip extension    Hip abduction    Hip adduction    Hip internal rotation    Hip external rotation  Knee flexion    Knee extension -15 -30  Ankle dorsiflexion    Ankle plantarflexion    Ankle inversion    Ankle eversion     (Blank rows = not tested)  LOWER EXTREMITY MMT:  MMT Right Eval 09/04/2023 Left Eval 09/04/2023 Right 10/29/2023 Left 10/29/2023  Hip flexion 4* 4* 5/5 c anterior hip pain 5/5  Hip extension      Hip abduction 3+ Did not assess due to precautions    Hip adduction 4+ 4    Hip internal rotation      Hip external rotation      Knee flexion 4+ 4+ 5/5 5/5  Knee extension 4- * 4- pulls in back 4+/5 4+/5  Ankle dorsiflexion 4 4    Ankle plantarflexion 4- 4-    Ankle inversion      Ankle eversion       (Blank rows = not tested, * = pain)  TRANSFERS:  09/04/2023  Sit<>stand: min A with RW for stability, pt using UEs to come to full standing  Stand pivot t/f: CGA with RW  Sit<>sup: min A for L LE management  FUNCTIONAL TESTS:   09/04/2023 TUG: 2 min 2 sec  GAIT: 09/22/2023:  FWW ambulation c reduced step lengths, shuffling gait, forward trunk lean.  Performed < 100 ft.   09/04/2023 Distance walked: 10' Assistive device utilized: Environmental consultant - 2 wheeled Level of assistance: CGA Comments: Antalgic, step to pattern leading with L LE, very diminished bilat                     TREATMENT        DATE:  11/09/2023 Therex:     TREATMENT        DATE: 10/29/2023 Therex: Nustep Lvl 6 8 mins UE/LE.  Standing heel/toe lifts with single hand on walker with CGA x 10 Review of sitting based HEP including seated LAQ bilateral, seated marching, seated heel/toe lifts and seated ball squeezes.  Demo of a few reps of each.    TherActivity Pivot transfer education and trial x 1 each direction c cues for placement of hands, sequencing.  SBA required.  Ambulation c FWW up ramp/down ramp with CGA with constant cues for placement of walker, step length and positioning.  Constant cues in all sit to stand to sit transfers between exercises to improve safety with hand placement, limiting pulling on walker in transfer movmeent.  Standing without assistance 1 min with feet hip width apart.  SBA.   Gait Training Ambulation with CGA with FWW with constant cues verbally and visually with band on walker to encourage increased step lengths.  Distances of household 50 ft, 100 ft, 50 ft.     TREATMENT        DATE:10/15/23 There Ex: Nustep Lvl 5 10 mins  UE/LE for ROM Seated yellow small ball hip adductor isometric squeeze bilateral 5 sec hold x 10  - no back rest Seated isometric bilateral hip abduction 5 sec hold x 10 with use of belt around knees to prevent hip abduction range.  No back rest Seated hamstring stretching manual Seated gastroc stretching with strap and PPT assist Seated hip flexion in WC for back support  TherActivity Pivot transfer education and trial x 2 each direction from wheelchair to seat with verbal cues for techniques,  foot placement and performance to help improve transfer ability at home.   Seated ball reach activity including forward and to the side with no back support  to work on core control and rotational motions to assist with ADLs and transfers   TREATMENT        DATE: 10/05/2023 Therex: Nustep Lvl 5 10 mins  UE/LE for ROM Seated yellow small ball hip adductor isometric squeeze bilateral 5 sec hold x 10  - no back rest Seated isometric bilateral hip abduction 5 sec holld x 10 with use of belt around knees to prevent hip abduction range.  No back rest  Neuro Re-ed (balance control, stability, postural improvements) BIG inspired stepping with CGA in // bars x 10 bilateral   TherActivity Pivot transfer education and trial x 2 each direction from wheelchair to seat with verbal cues for techniques, foot placement and performance to help improve transfer ability at home.  Standing retro step with lateral step for transfers with CGA to min A at times x 10 bilateral  Seated bilateral UE reach with thoracic extension with yellow un weighted ball Rollator ambulation with CGA with occasional to moderate cues for step length improvements, Performed 50 ft.   TREATMENT        DATE: 10/01/2023 Therex: Nustep Lvl 5 x 6 min UE/LE for ROM Seated hamstring stretch 2x 30 Seated calf stretch with strap 2x30  TherAct: Reach fwd sit<>stand 2x5 Standing overhead reach for trunk/hip extension x10 with chair support Standing side reach for thoracic extension/rotation x10 with chair support  Gait Training Standing heel strike fwd and bwd 2x10 R&L with chair support FWW with CGA with consistent verbal cues throughout for RW negotiation/foot placement, heel strike, increased step length Performed 100 ft, 130 ft, 30 ft     PATIENT EDUCATION:  Education details: Exam findings, POC, initial HEP, importance of weightbearing Person educated: Patient and Caregiver   Education method: Explanation, Demonstration,  Verbal cues, and Handouts Education comprehension: verbalized understanding, returned demonstration, and needs further education  HOME EXERCISE PROGRAM: Access Code: [email or web address] URL: [email or web address] Date: 10/29/2023 Prepared by: Ozell Silvan  Exercises - Supine Heel Slide  - 1 x daily - 7 x weekly - 1 sets - 10 reps - Small Range Straight Leg Raise  - 1 x daily - 7 x weekly - 1 sets - 10 reps - Proper Sit to Stand Technique  - 10 x daily - 7 x weekly - 1 sets - 2-3 reps - Supine Bridge with Resistance Band  - 1 x daily - 7 x weekly - 1 sets - 10 reps - Seated Long Arc Quad  - 1-2 x daily - 7 x weekly - 1-2 sets - 10 reps - 2 hold - Seated March  - 1-2 x daily - 7 x weekly - 1-2 sets - 10 reps - Seated Heel Toe Raises  - 1-2 x daily - 7 x weekly - 1-2 sets - 10 reps - Seated Hip Adduction Isometrics with Ball  - 1-2 x daily - 7 x weekly - 1-2 sets - 10 reps - 5 hold  ASSESSMENT:  CLINICAL IMPRESSION: Cuing for step length with verbal and visual cues on walker did help improve for several steps but without cues, reversion back to shuffling gait noted.  Difficulty correcting posterior weight shift noted in standing and in ambulation.  Continued skilled PT services indicated at this time.   OBJECTIVE IMPAIRMENTS: Abnormal gait, decreased activity tolerance, decreased balance, decreased coordination, decreased endurance, decreased mobility, difficulty walking, decreased ROM, decreased strength, hypomobility, increased fascial restrictions, impaired flexibility, improper body mechanics, postural dysfunction, and pain.   ACTIVITY LIMITATIONS: carrying, lifting, bending, standing,  squatting, stairs, transfers, bed mobility, bathing, toileting, dressing, hygiene/grooming, and locomotion level  PARTICIPATION LIMITATIONS: cleaning, interpersonal relationship, and community activity  PERSONAL FACTORS: Age, Fitness, Past/current experiences, Time since onset of  injury/illness/exacerbation, and 1 comorbidity: R THA are also affecting patient's functional outcome.   REHAB POTENTIAL: Good  CLINICAL DECISION MAKING: Evolving/moderate complexity  EVALUATION COMPLEXITY: Moderate   GOALS: Goals reviewed with patient? Yes  SHORT TERM GOALS: Target date: 10/09/2023  Pt will be ind with initial HEP Baseline: Goal status: not met  2.  Pt will be able to perform 5x STS without assist Baseline:  Goal status: not met  3.  Pt will be able to amb at least 50' with RW SBA for home mobility Baseline:  Goal status: Met   LONG TERM GOALS: Target date: 11/13/2023   Pt will be ind with management and progression of HEP Baseline:  Goal status: on going 10/29/2023  2.  Pt will improve TUG to </=1'30 to demo improved gait pattern Baseline:  Goal status:  on going 10/29/2023  3.  Pt will be able to amb at least 150' with RW SBA for safe home amb Baseline:  Goal status:  on going 10/29/2023  4.  Pt will have improved PSFS average score to >/=5 to demo MCID Baseline:  Goal status:  on going 10/29/2023  5.  Pt will be able to perform all transfers mod I for home to reduce caregiver burden Baseline:  Goal status:  on going 10/29/2023  PLAN:  PT FREQUENCY: 2x/week  PT DURATION: 10 weeks  PLANNED INTERVENTIONS: 97164- PT Re-evaluation, 97750- Physical Performance Testing, 97110-Therapeutic exercises, 97530- Therapeutic activity, 97112- Neuromuscular re-education, 97535- Self Care, 02859- Manual therapy, [id number]- Gait training, [phone]- Aquatic Therapy, [phone]- Electrical stimulation (unattended), 97016- Vasopneumatic device, [id number]- Ultrasound, [id number]- Ionotophoresis 4mg /ml Dexamethasone , 79439 (1-2 muscles), 20561 (3+ muscles)- Dry Needling, Patient/Family education, Balance training, Stair training, Taping, Joint mobilization, Cryotherapy, and Moist heat  PLAN FOR NEXT SESSION:  Continued mobility improvements for safety.  Functional strengthening.  Possible  bands added to walker for step length cues.   Ozell Silvan, PT, DPT, OCS, ATC 11/09/23  9:48 AM

## 2023-11-09 NOTE — ED Notes (Signed)
 Patient transported to CT

## 2023-11-09 NOTE — Telephone Encounter (Signed)
 Called after no show for appointment 20 mins past appointment time.  Phone message was played that mailbox was full.    Review of chart indicated an ED visit following a fall, with visit this morning and discharge to home  Ozell Silvan, PT, DPT, OCS, ATC 11/09/23  12:10 PM  .

## 2023-11-10 ENCOUNTER — Telehealth: Payer: Self-pay | Admitting: Surgical

## 2023-11-10 NOTE — Telephone Encounter (Signed)
 Patient's worker called for him, would like to know if the pain medication could be in a liquid form? He is having trouble swallowing now. You can call patient to let him know if something could be called in.

## 2023-11-10 NOTE — Telephone Encounter (Signed)
 Felicia (Caregiver) called stating pt need script for bed. Please call pt about this matter aT [phone].

## 2023-11-11 ENCOUNTER — Ambulatory Visit (HOSPITAL_COMMUNITY)
Admission: RE | Admit: 2023-11-11 | Discharge: 2023-11-11 | Disposition: A | Discharge status: Home / Self Care | Source: Ambulatory Visit | Attending: Surgical | Admitting: Surgical

## 2023-11-11 ENCOUNTER — Encounter: Admitting: Physical Therapy

## 2023-11-11 ENCOUNTER — Ambulatory Visit (HOSPITAL_COMMUNITY)

## 2023-11-11 ENCOUNTER — Other Ambulatory Visit: Payer: Self-pay | Admitting: Surgical

## 2023-11-11 DIAGNOSIS — M533 Sacrococcygeal disorders, not elsewhere classified: Secondary | ICD-10-CM | POA: Insufficient documentation

## 2023-11-11 MED ORDER — HYDROCODONE-ACETAMINOPHEN 7.5-325 MG/15ML PO SOLN: 10.0000 mL | Freq: Two times a day (BID) | ORAL | 0 refills | Status: DC | PRN

## 2023-11-11 NOTE — Telephone Encounter (Signed)
 Tried calling caregiver. Went straight to voice mail and mail box was full

## 2023-11-11 NOTE — Telephone Encounter (Signed)
 Hmm maybe

## 2023-11-11 NOTE — Telephone Encounter (Signed)
 Tried calling back, went to voice mail and mail box is full

## 2023-11-11 NOTE — Telephone Encounter (Signed)
 Sent in RX for hydrocodone .  Recently went to ER and no new fracture based on pelvis and Lspine Cts. Maybe MRI will show occult fracture

## 2023-11-13 ENCOUNTER — Telehealth: Payer: Self-pay | Admitting: Orthopedic Surgery

## 2023-11-13 NOTE — Telephone Encounter (Signed)
Hospital bed ordered on [facility].

## 2023-11-13 NOTE — Telephone Encounter (Signed)
 Felicia pt caregiver called requesting script for hospital bed per Autumn H. Will submit to [facility].Felicia phone number [phone]

## 2023-11-17 ENCOUNTER — Ambulatory Visit: Payer: Self-pay | Admitting: Surgical

## 2023-11-17 NOTE — Progress Notes (Signed)
 I called. Left VM detailing results and hospital bed pending.  Can we make appt for Lee Nelson to re-eval with Dr Addie at next appt in like 3ish weeks?

## 2023-11-19 NOTE — Progress Notes (Signed)
 Appointment scheduled.

## 2023-11-23 ENCOUNTER — Encounter: Payer: Self-pay | Admitting: Psychology

## 2023-11-27 ENCOUNTER — Encounter: Admitting: Rehabilitative and Restorative Service Providers"

## 2023-11-27 ENCOUNTER — Telehealth: Payer: Self-pay | Admitting: Rehabilitative and Restorative Service Providers"

## 2023-11-27 NOTE — Telephone Encounter (Signed)
 Voicemail full.  No further appointments scheduled.  Today was no show #2.

## 2023-12-09 ENCOUNTER — Ambulatory Visit: Admitting: Orthopedic Surgery

## 2023-12-09 ENCOUNTER — Other Ambulatory Visit (INDEPENDENT_AMBULATORY_CARE_PROVIDER_SITE_OTHER): Payer: Self-pay

## 2023-12-09 ENCOUNTER — Encounter: Payer: Self-pay | Admitting: Orthopedic Surgery

## 2023-12-09 DIAGNOSIS — M545 Low back pain, unspecified: Secondary | ICD-10-CM | POA: Diagnosis not present

## 2023-12-09 DIAGNOSIS — M79671 Pain in right foot: Secondary | ICD-10-CM | POA: Diagnosis not present

## 2023-12-09 DIAGNOSIS — M25551 Pain in right hip: Secondary | ICD-10-CM | POA: Diagnosis not present

## 2023-12-09 DIAGNOSIS — W19XXXD Unspecified fall, subsequent encounter: Secondary | ICD-10-CM | POA: Diagnosis not present

## 2023-12-09 DIAGNOSIS — M25561 Pain in right knee: Secondary | ICD-10-CM

## 2023-12-09 NOTE — Progress Notes (Signed)
 Office Visit Note   Patient: Lee Nelson           Date of Birth: 06/22/1940           MRN: [id number] Visit Date: 12/09/2023 Requested by: Janey Santos, MD [address] [place],  [place] [address] PCP: Janey Santos, MD  Subjective: Chief Complaint  Patient presents with   Other    Follow up from ER visit s/p fall on 11/01/23 Patient requesting right hip, back, knee, foot to be evalutated    HPI: Lee Nelson is a 83 y.o. male who presents to the office reporting multiple orthopedic complaints.  Patient had a fall 11/01/2023 where he sustained nondisplaced sacral fractures really only visible on MRI scan.  He has been doing some walking but is having balance problems.  Overall patient states his pain is not terrible.  Is able to walk from the bed to the dining room using a walker and assistance.  Reporting some sacral and hip pain as well as low back pain right foot pain and right knee pain..                ROS: All systems reviewed are negative as they relate to the chief complaint within the history of present illness.  Patient denies fevers or chills.  Assessment & Plan: Visit Diagnoses:  1. Fall, subsequent encounter   2. Pain in right foot   3. Right knee pain, unspecified chronicity   4. Low back pain, unspecified back pain laterality, unspecified chronicity, unspecified whether sciatica present   5. Pain in right hip     Plan: Impression is overall Lee Nelson is actually doing well from his extremity perspective.  I think the sacral fractures have healed.  The rest of his lower extremity issues do not look like they have a significant structural basis which will require intervention.  Right foot right knee in hip exams and radiographs do not show any acute injury.  We will try to get him in for some more therapy for balancing.  Follow-up with us  as needed.  Follow-Up Instructions: No follow-ups on file.   Orders:  Orders Placed This Encounter  Procedures    XR Foot Complete Right   XR Lumbar Spine 2-3 Views   XR HIP UNILAT W OR W/O PELVIS 2-3 VIEWS RIGHT   XR Pelvis 1-2 Views   XR Knee 1-2 Views Right   Ambulatory referral to Physical Therapy   No orders of the defined types were placed in this encounter.     Procedures: No procedures performed   Clinical Data: No additional findings.  Objective: Vital Signs: There were no vitals taken for this visit.  Physical Exam:  Constitutional: Patient appears well-developed HEENT:  Head: Normocephalic Eyes:EOM are normal Neck: Normal range of motion Cardiovascular: Normal rate Pulmonary/chest: Effort normal Neurologic: Patient is alert Skin: Skin is warm Psychiatric: Patient has normal mood and affect  Ortho Exam: Ortho exam demonstrates excellent hip flexion strength with no groin pain with internal/external Lee Nelson of either leg.  Both knees do not show any effusion or ligamentous instability.  Passive range of motion is full.  Right ankle has no tenderness to palpation around the ankle or foot region with nontender intact anterior to posterior to peroneal and Achilles tendons.  Pedal pulses palpable.  Specialty Comments:  No specialty comments available.  Imaging: No results found.   PMFS History: Patient Active Problem List   Diagnosis Date Noted   Status post total replacement  of right hip 09/22/2022   Protein-calorie malnutrition, severe 09/08/2022   Hip fracture (HCC) 09/07/2022   BPH (benign prostatic hyperplasia) 09/07/2022   Closed fracture of right femur, unspecified fracture morphology, initial encounter (HCC) 09/07/2022   Fall 09/07/2022   Closed subcapital fracture of neck of femur, right, initial encounter (HCC) 09/07/2022   Memory impairment 08/06/2022   Pneumonia due to COVID-19 virus 05/06/2022   Severe sepsis (HCC) 05/06/2022   Acute hypoxemic respiratory failure (HCC) 05/06/2022   NPH (normal pressure hydrocephalus) (HCC) 05/06/2022   Physical  deconditioning 05/06/2022   Thrombocytopenia (HCC) 05/06/2022   Dementia (HCC) 05/06/2022   SBO (small bowel obstruction) (HCC) 12/31/2019   Amaurosis fugax of right eye 08/25/2017   PSVT (paroxysmal supraventricular tachycardia) (HCC) 09/12/2015   Chest pain 05/03/2015   Dyspnea 05/03/2015   ALLERGIC RHINITIS 09/21/2007   G E R D 09/21/2007   SMOKE INHALATION 09/21/2007   OSTEOPOROSIS 01/28/2007   PALPITATIONS 01/28/2007   COUGH 01/28/2007   ALLERGY 01/28/2007   Past Medical History:  Diagnosis Date   Anxiety    Arthritis    Bladder calculus    BPH (benign prostatic hyperplasia)    Chronic constipation    Complication of anesthesia     I had some coughing afterwards for a couple of days--  per pt perfers spinal anesthesia since general anesthesia congnitive issues when older   Diverticulosis of colon    Dry eye syndrome of both eyes    Environmental allergies    GERD (gastroesophageal reflux disease)    occasional   History of adenomatous polyp of colon    08/ 2004   History of kidney stones    History of squamous cell carcinoma in situ (SCCIS) of skin    s/p  excision 2013 facial areas and 06/ 2016 nose   Migraine    eye migraine occasional   Seasonal and perennial allergic rhinitis    Thrombocytopenia (HCC)    Tingling    hands and feet bilat , intermittantly-- per pt has lumbar bulging disk   Urinary frequency    Vocal fold atrophy    dysphonia-- per pt has to drink large amount of water  to take even on pill   Wears glasses     Family History  Problem Relation Age of Onset   Parkinsonism Brother    COPD Mother    Allergies Mother    Heart failure Mother    COPD Father    Stroke Father    Other Sister    Suicidality Maternal Aunt    Cancer Maternal Grandfather     Past Surgical History:  Procedure Laterality Date   APPENDECTOMY  child   CARDIOVASCULAR STRESS TEST  05-17-2015  dr hilty   Low risk nuclear study w/ no ischemia/  normal LV function and  wall motion , stress ef 54% (lvef 45-54%)   CHOLECYSTECTOMY N/A 09/29/2014   Procedure: LAPAROSCOPIC CHOLECYSTECTOMY WITH INTRAOPERATIVE CHOLANGIOGRAM;  Surgeon: Alm Angle, MD;  Location: [facility];  Service: General;  Laterality: N/A;   COLONOSCOPY  last one 09-06-2010   ESOPHAGOGASTRODUODENOSCOPY N/A 12/09/2013   Procedure: ESOPHAGOGASTRODUODENOSCOPY (EGD);  Surgeon: Elsie Cree, MD;  Location: [facility];  Service: Endoscopy;  Laterality: N/A;   EXTRACORPOREAL SHOCK WAVE LITHOTRIPSY  yrs ago   INGUINAL HERNIA REPAIR Left child   inguinal hernia repair  09/2017   NISSEN FUNDOPLICATION  1980's   open   STONE EXTRACTION WITH BASKET N/A 08/18/2016   Procedure: STONE EXTRACTION WITH BASKET;  Surgeon: Chales Idol, MD;  Location: [facility];  Service: Urology;  Laterality: N/A;   THULIUM LASER TURP (TRANSURETHRAL RESECTION OF PROSTATE) N/A 08/18/2016   Procedure: THULIUM LASER BLADDER NECK INCISION AND BLADDER STONE REMOVAL;  Surgeon: Chales Idol, MD;  Location: [facility];  Service: Urology;  Laterality: N/A;   TONSILLECTOMY  child   TOTAL HIP ARTHROPLASTY Right 09/07/2022   Procedure: TOTAL HIP ARTHROPLASTY ANTERIOR APPROACH;  Surgeon: Vernetta Lonni GRADE, MD;  Location: [facility];  Service: Orthopedics;  Laterality: Right;   TRANSTHORACIC ECHOCARDIOGRAM  11/18/2010   grade 1 diastolic dysfunction, ef 55-60%/  trivial MR and TR/ mild dilated RA   Social History   Occupational History   Occupation: teaches constitutional law   Occupation: Photographer: [employer]  Tobacco Use   Smoking status: Never   Smokeless tobacco: Never  Vaping Use   Vaping status: Never Used  Substance and Sexual Activity   Alcohol  use: Yes    Comment: social   Drug use: No   Sexual activity: Not on file

## 2023-12-22 ENCOUNTER — Ambulatory Visit: Admitting: Psychology

## 2023-12-22 ENCOUNTER — Ambulatory Visit (INDEPENDENT_AMBULATORY_CARE_PROVIDER_SITE_OTHER): Admitting: Psychology

## 2023-12-22 DIAGNOSIS — F0392 Unspecified dementia, unspecified severity, with psychotic disturbance: Secondary | ICD-10-CM | POA: Diagnosis not present

## 2023-12-22 DIAGNOSIS — R4189 Other symptoms and signs involving cognitive functions and awareness: Secondary | ICD-10-CM

## 2023-12-22 NOTE — Progress Notes (Unsigned)
 NEUROPSYCHOLOGICAL EVALUATION [place]  [place] Department of Neurology  Date of Evaluation: 12/22/2023  REASON FOR REFERRAL   Lee Nelson is an 83 year old, right-handed, White male with 19 years of formal education. He was referred for neuropsychological evaluation by Camie Sevin, PA-C, to assess current neurocognitive functioning, document potential cognitive deficits, and assist with treatment planning. This is his first neuropsychological evaluation.  SUMMARY OF RESULTS   Premorbid cognitive abilities are estimated to be in the *** range based on word reading and sociodemographic factors. ***  DIAGNOSTIC IMPRESSION   Results of the current evaluation indicated ***  ICD-10 Codes: ***  RECOMMENDATIONS   A repeat neuropsychological evaluation in *** months (or sooner if functional decline is noted) is recommended.  In consultation with your doctor, schedule cognitive reevaluation on an as-needed basis to assess for cognitive decline and update treatment recommendations. Reevaluation should occur during a period of medical and affective stability.***  ***Discuss with your neurologist the risks and benefits of starting a medication that can help slow memory decline.  ***Patient has already been prescribed a medication (i.e., ***) aimed at addressing memory concerns. He is encouraged to continue taking this medication as prescribed. It is important to highlight that this medication has been shown to slow functional decline in some individuals.  Since no one resides in the home who can casually oversee that instrumental activities of daily living are being completed appropriately, the patient is encouraged to use available tools and strategies--such as medication organizers, pillboxes, automated reminders, and autopay services--to support the management of medications, finances, and medical appointments and to minimize the risk of errors.***  Findings from  this evaluation raise concern about the patient's ability to safely drive.*** Performance across neurocognitive testing is not a strong predictor of an individual's safety operating a motor vehicle. Should *** family wish to pursue a formalized driving evaluation, they could reach out to the following agencies:  [facility] [employer] [facility] in [place]: [phone] [facility] in [place]: [phone] [facility] in [place]: [phone] [facility] in [place]: [phone] or [phone]  Continue treatment for ***, especially given that emotional distress can exacerbate cognitive difficulties. Discuss current medication regimen with your prescribing provider to ensure you are receiving maximum benefit. If symptoms begin to interfere with daily functioning, you may wish to reconsider exploring additional treatment options, such as mindfulness, relaxation techniques, or counseling.  Managing grief is important for overall well-being. You may find some of the following recommendations helpful:***  Allow yourself to grieve: It is important to give yourself permission to feel the emotions that come with grief. Everyone grieves differently, and there is no set timeline for when you "should" feel better. Allow yourself to experience sadness, anger, or even relief as part of the healing process. Seek support: Grief can feel isolating, but talking with someone you trust, whether it is a friend, family member, or counselor, can help you process your emotions. You might also consider joining a support group, where you can connect with others who understand what you are going through. Practice self-care: Take care of your physical health during this time. Regular exercise, a balanced diet, and adequate sleep can help your body manage the stress of grief. It is also important to do things that make you feel good, whether it is reading, taking walks, or pursuing a  hobby you enjoy. Find ways to honor the memory of your loved one: Finding a way to celebrate the life of your loved one,  such as creating a memory book or doing something they loved, can help keep their memory alive and bring you comfort. Focus on the present: While remembering your loved one is important, it can also help to stay present and focus on small moments of joy in your daily life. Engage in activities that bring you a sense of calm or happiness, even if it is just for a short time. Consider mindfulness or relaxation techniques: Practices such as mindfulness meditation, yoga, or deep breathing exercises can help you manage overwhelming emotions and find moments of peace in your day. Know that healing takes time: Grief is not something you "get over" - it is something you learn to live with. Over time, the pain may lessen, and you will find ways to adjust to life without your loved one, but it is important to remember that healing is not linear.  Given ongoing sleep difficulties, he may benefit from the implementation of sleep hygiene techniques, including:***  Go to bed and get up at the same time each day to help your body establish a regular rhythm. Establish and maintain a bedtime routine. Certain activities such as stretching, meditating, listening to soft music, or reading ~15 minutes before bedtime can be a great way to regularly get your brain and body ready for sleep. Avoid taking naps during the day. Avoid alcohol  and caffeine for 5 or 6 hours before going to bed. Get regular exercise, but not in the hours before bedtime. Use comfortable bedding and maintain a cool temperature in your bedroom. Block out light and distracting noise. Avoid watching television or using your phone/computer in bed. Avoid staying in bed if you have difficulty falling asleep. If you have not been able to get to sleep after about 20 minutes or more, get up and do something calming or boring until you feel  sleepy, then return to bed and try again.  Prioritize physical health through diet, exercise, and sleep. Regular physical activity supports cardiovascular health, improves mood, and helps preserve mobility and independence. Aim for at least 150 minutes of moderate aerobic exercise per week (e.g., brisk walking, swimming, gardening). A brain-healthy diet such as the Mediterranean or MIND diet is rich in fruits, vegetables, whole grains, healthy fats, and lean proteins, and has been associated with reduced risk of cognitive decline. Additionally, getting adequate, quality sleep and managing chronic conditions with the help of healthcare providers are essential components of healthy aging.***  Continue to stay socially and mentally engaged. Maintaining strong social connections and regularly stimulating your brain can help protect against cognitive decline. This includes staying connected with friends and family, volunteering, or participating in community groups. Mentally engaging activities--such as reading, doing puzzles, playing strategy games, or learning a new language or musical instrument--promote brain plasticity. If you are interested in activities to support cognitive engagement, this site offers a variety of apps and games organized by difficulty level:***  https://www.[facility].org/get-to-know-barrow/centers-programs/neurorehabilitation-center/neuro-rehab-apps-and-games/  Consider implementing compensatory strategies to maximize independence and maintain daily functioning. Examples include:  Adhere to routine. Compensatory strategies work best when they are used consistently. Use a planner, calendar, or white board that has the schedule and important events for the day clearly listed to reference and cross off when tasks are complete.  Ask for written information, especially if it is new or unfamiliar (e.g., information provided at a doctor's appointment).  Create an organized environment.  Keep items that can be easily misplaced in a sensible location and get into the habit of always returning  the items to those places. Pay attention and reduce distractions. Make a point of focusing attention on information you want to remember. One-on-one interaction is more likely to facilitate attention and minimize distraction. Make eye contact and repeat the information out loud after you hear it. Reduce interruptions or distractions especially when attempting to learn new information.  Create associations. When learning something new, think about and understand the information. Explain it in your own words or try to associate it with something you already know. Take notes to help remember important details. Evaluate goals and plan accordingly. When confronted by many different tasks, begin by making a list that prioritizes each task and estimates the time it will take to complete. Break down complicated tasks into smaller, more manageable steps. Focus on one task at a time and complete each task before starting another. Avoid multitasking.   {KDEISSDRIVINGRECS:32201}  {KDEISSDEMENTIARECS:32187}  {KDEISSHEALTHYLIVINGRECS:32196}  {KDEISSPSYCHRECS:32202}  {KDEISSREFERRALRECS:32193}  {KDEISSSLEEPRECS:32198}  {KDEISSSUBSTANCERECS:32199}  {KDEISSSENSORYRECS:32189}  {KDEISSACPRECS (Optional):32200}  {KDEISSCOGRECS:32216}  {KDEISSCAREGIVINGRECS:32194}  DISPOSITION   Patient will follow up with the referring provider, Ms. Wertman. {KDEISSFOLLOWUP:32247}. He and his family will be provided verbal feedback in approximately one week regarding the findings and impression during this visit.  The remainder of the report includes the details of the patient's background and a table of results from the current evaluation, which support the summary and recommendations described above.  BACKGROUND   History of Presenting Illness: The following information was obtained from a review of medical  records and an interview with the patient, his wife, Marval, and his son, Adina. ***  Cognitive Functioning: During today's appointment, the patient reported ***.  Physical Functioning: Patient denied difficulties with sleep initiation and maintenance. Appetite is stable. No changes to sense of taste or smell were reported. Vision and hearing are stable. ***RBD, falls, tremor, speech, incontinence, pain***  Emotional Functioning: Patient reported his current mood as ***. ***  Neuroimaging: MRI of the brain (***) documented ***.  Other Relevant Medical History: Remarkable for ***. Please refer to the medical record for a more comprehensive problem list. No history of stroke, CNS infection, head injury, or seizure was reported.***  Current Medications: Per patient, ***.  The patient denied taking medications.*** At this time, he reports only taking vitamins and supplements.***  Functional Status: Patient independently performs all ADLs and IADLs, including driving, without difficulty.***  Family Neurological History: Remarkable for ***  Psychiatric History: Remarkable for ***.  History of depression, anxiety, prior mental health treatment, suicidal ideation, hallucinations, and psychiatric hospitalizations was not reported.  Substance Use History: {KDEISSSUBSTANCES:32250}  Social and Developmental History: Patient was born in ***. History of perinatal complications and developmental delays was not reported. Language ***. Patient is *** and has *** children who live locally. Patient resides in ***.  Educational and Occupational History: No history of childhood learning disability, special education services, or grade retention was reported. Patient completed *** or obtained a *** degree. Patient was employed as ***. Patient retired in [employer]  BEHAVIORAL OBSERVATIONS   Patient arrived on time and was accompanied by his wife, Marval, and his son, Adina. He ambulated very slowly with a walker,  taking very short steps without lifting his feet. He had difficulty turning, as well as getting up and down. Eventually, he transitioned to using a wheelchair and remained in it. He was alert and oriented to person and place but not to time (i.e., guessed it was December 2005). He was appropriately groomed and dressed for the setting. A subtle  tremor was observed in his hands bilaterally. Vision (with glasses) and hearing (with hearing aids) were adequate for testing purposes. Speech was of normal rate, prosody, and volume. No conversational word-finding difficulties, paraphasic errors, or dysarthria were observed. Comprehension was conversationally intact. Thought processes were {KDEISSBO4:32257}. Thought content was organized and devoid of delusions. Insight appeared {KDEISSBO5:32258}. Affect was *** and congruent with {KDEISSBO7:32260} mood. He was cooperative and gave adequate effort during testing, including on standalone and embedded measures of performance validity***. Results are thought to accurately reflect his cognitive functioning at this time.  NEUROPSYCHOLOGICAL TESTING RESULTS   Tests Administered: {KDEISSNPTESTS:32266}  Test results are provided in the table below. Whenever possible, the patient's scores were compared against age-, sex-, and education-corrected normative samples. Interpretive descriptions are based on the AACN consensus conference statement on uniform labeling (Guilmette et al., 2020).  ***  INFORMED CONSENT   Patient was provided with a verbal description of the nature and purpose of the neuropsychological evaluation. Also reviewed were the foreseeable risks and/or discomforts and benefits of the procedure, limits of confidentiality, and mandatory reporting requirements of this provider. Patient was given the opportunity to have their questions answered. Oral consent to participate was provided by the patient.   This report was prepared as part of a clinical evaluation  and is not intended for forensic use.  SERVICE   This evaluation was conducted by Renda Beckwith, Psy.D. In addition to time spent directly with the patient, total professional time (*** minutes) includes record review, integration of relevant medical history, test selection, interpretation of findings, and report preparation. A technician in training, Evalene Pizza, B.S., provided testing and scoring assistance (*** minutes) and was shadowed by another technician, Lonell Jude, B.S.  Psychiatric Diagnostic Evaluation Services (Professional): 09208 x 1 Neuropsychological Testing Evaluation Services (Professional): 03867 x 1 Neuropsychological Testing Evaluation Services (Professional): 03866 x *** Neuropsychological Test Administration and Scoring (Technician): [phone] x 1 Neuropsychological Test Administration and Scoring (Technician): [phone] x ***  This report was generated using voice recognition software. While this document has been carefully reviewed, transcription errors may be present. I apologize in advance for any inconvenience. Please contact me if further clarification is needed.            Renda Beckwith, Psy.D.             Neuropsychologist

## 2023-12-23 NOTE — Progress Notes (Signed)
   Psychometrician Note   Cognitive testing was administered to Lee Nelson by Lonell Jude, B.S. (psychometrist) under the supervision of Dr. Renda Beckwith, Psy.D., licensed psychologist on 12/22/2023. Lee Nelson did not appear overtly distressed by the testing session per behavioral observation or responses across self-report questionnaires. Rest breaks were offered.   The battery of tests administered was selected by Dr. Renda Beckwith, Psy.D. with consideration to Lee Nelson's current level of functioning, the nature of his symptoms, emotional and behavioral responses during interview, level of literacy, observed level of motivation/effort, and the nature of the referral question. This battery was communicated to the psychometrist. Communication between Dr. Renda Beckwith, Psy.D. and the psychometrist was ongoing throughout the evaluation and Dr. Renda Beckwith, Psy.D. was immediately accessible at all times. Dr. Renda Beckwith, Psy.D. provided supervision to the psychometrist on the date of this service to the extent necessary to assure the quality of all services provided.    Lee Nelson will return within approximately 1-2 weeks for an interactive feedback session with Dr. Beckwith at which time his test performances, clinical impressions, and treatment recommendations will be reviewed in detail. Lee Nelson understands he can contact our office should he require our assistance before this time.  A total of 165 minutes of billable time were spent face-to-face with Lee Nelson by the psychometrist. This includes both test administration and scoring time. Billing for these services is reflected in the clinical report generated by Dr. Renda Beckwith, Psy.D.  This note reflects time spent with the psychometrician and does not include test scores or any clinical interpretations made by Dr. Beckwith. The full report will follow in a separate note.

## 2023-12-25 ENCOUNTER — Other Ambulatory Visit: Payer: Self-pay | Admitting: Surgical

## 2023-12-25 ENCOUNTER — Telehealth: Payer: Self-pay

## 2023-12-25 MED ORDER — HYDROCODONE-ACETAMINOPHEN 7.5-325 MG/15ML PO SOLN: 10.0000 mL | Freq: Every day | ORAL | 0 refills | Status: AC | PRN

## 2023-12-25 NOTE — Telephone Encounter (Signed)
 Sent in. No refills

## 2023-12-25 NOTE — Telephone Encounter (Signed)
 Hydrocodone -Acetaminophen  7.5/325 MG  Tablets  PATIENT USES [facility] ON [address]

## 2023-12-28 ENCOUNTER — Institutional Professional Consult (permissible substitution): Payer: Self-pay | Admitting: Psychology

## 2023-12-28 ENCOUNTER — Ambulatory Visit

## 2023-12-28 NOTE — Therapy (Signed)
 OUTPATIENT PHYSICAL THERAPY EVALUATION   Patient Name: TAMAS SUEN MRN: [id number] DOB:Jun 27, 1940, 83 y.o., male Today's Date: 12/29/2023  END OF SESSION:  PT End of Session - 12/29/23 1157     Visit Number 1    Number of Visits 20    Date for Recertification  03/08/24    Authorization Type Medicare    Progress Note Due on Visit 10    PT Start Time 1055    PT Stop Time 1142    PT Time Calculation (min) 47 min    Activity Tolerance Patient tolerated treatment well    Behavior During Therapy [facility] for tasks assessed/performed                Past Medical History:  Diagnosis Date   Anxiety    Arthritis    Bladder calculus    BPH (benign prostatic hyperplasia)    Chronic constipation    Complication of anesthesia     I had some coughing afterwards for a couple of days--  per pt perfers spinal anesthesia since general anesthesia congnitive issues when older   Diverticulosis of colon    Dry eye syndrome of both eyes    Environmental allergies    GERD (gastroesophageal reflux disease)    occasional   History of adenomatous polyp of colon    08/ 2004   History of kidney stones    History of squamous cell carcinoma in situ (SCCIS) of skin    s/p  excision 2013 facial areas and 06/ 2016 nose   Migraine    eye migraine occasional   Seasonal and perennial allergic rhinitis    Thrombocytopenia    Tingling    hands and feet bilat , intermittantly-- per pt has lumbar bulging disk   Urinary frequency    Vocal fold atrophy    dysphonia-- per pt has to drink large amount of water  to take even on pill   Wears glasses    Past Surgical History:  Procedure Laterality Date   APPENDECTOMY  child   CARDIOVASCULAR STRESS TEST  05-17-2015  dr hilty   Low risk nuclear study w/ no ischemia/  normal LV function and wall motion , stress ef 54% (lvef 45-54%)   CHOLECYSTECTOMY N/A 09/29/2014   Procedure: LAPAROSCOPIC CHOLECYSTECTOMY WITH INTRAOPERATIVE CHOLANGIOGRAM;  Surgeon:  Alm Angle, MD;  Location: [facility];  Service: General;  Laterality: N/A;   COLONOSCOPY  last one 09-06-2010   ESOPHAGOGASTRODUODENOSCOPY N/A 12/09/2013   Procedure: ESOPHAGOGASTRODUODENOSCOPY (EGD);  Surgeon: Elsie Cree, MD;  Location: [facility];  Service: Endoscopy;  Laterality: N/A;   EXTRACORPOREAL SHOCK WAVE LITHOTRIPSY  yrs ago   INGUINAL HERNIA REPAIR Left child   inguinal hernia repair  09/2017   NISSEN FUNDOPLICATION  1980's   open   STONE EXTRACTION WITH BASKET N/A 08/18/2016   Procedure: STONE EXTRACTION WITH BASKET;  Surgeon: Chales Idol, MD;  Location: [facility];  Service: Urology;  Laterality: N/A;   THULIUM LASER TURP (TRANSURETHRAL RESECTION OF PROSTATE) N/A 08/18/2016   Procedure: THULIUM LASER BLADDER NECK INCISION AND BLADDER STONE REMOVAL;  Surgeon: Chales Idol, MD;  Location: [facility];  Service: Urology;  Laterality: N/A;   TONSILLECTOMY  child   TOTAL HIP ARTHROPLASTY Right 09/07/2022   Procedure: TOTAL HIP ARTHROPLASTY ANTERIOR APPROACH;  Surgeon: Vernetta Lonni GRADE, MD;  Location: [facility];  Service: Orthopedics;  Laterality: Right;   TRANSTHORACIC ECHOCARDIOGRAM  11/18/2010   grade 1 diastolic dysfunction, ef 55-60%/  trivial MR  and TR/ mild dilated RA   Patient Active Problem List   Diagnosis Date Noted   Status post total replacement of right hip 09/22/2022   Protein-calorie malnutrition, severe 09/08/2022   Hip fracture (HCC) 09/07/2022   BPH (benign prostatic hyperplasia) 09/07/2022   Closed fracture of right femur, unspecified fracture morphology, initial encounter (HCC) 09/07/2022   Fall 09/07/2022   Closed subcapital fracture of neck of femur, right, initial encounter (HCC) 09/07/2022   Memory impairment 08/06/2022   Pneumonia due to COVID-19 virus 05/06/2022   Severe sepsis (HCC) 05/06/2022   Acute hypoxemic respiratory failure (HCC) 05/06/2022   NPH (normal pressure hydrocephalus) (HCC)  05/06/2022   Physical deconditioning 05/06/2022   Thrombocytopenia 05/06/2022   Dementia (HCC) 05/06/2022   SBO (small bowel obstruction) (HCC) 12/31/2019   Amaurosis fugax of right eye 08/25/2017   PSVT (paroxysmal supraventricular tachycardia) 09/12/2015   Chest pain 05/03/2015   Dyspnea 05/03/2015   ALLERGIC RHINITIS 09/21/2007   G E R D 09/21/2007   SMOKE INHALATION 09/21/2007   OSTEOPOROSIS 01/28/2007   PALPITATIONS 01/28/2007   COUGH 01/28/2007   ALLERGY 01/28/2007    PCP: Janey Santos, MD  REFERRING PROVIDER: Addie Cordella Hamilton, MD   REFERRING DIAG:  W19.XXXD (ICD-10-CM) - Fall, subsequent encounter  M79.671 (ICD-10-CM) - Pain in right foot  M25.561 (ICD-10-CM) - Right knee pain, unspecified chronicity  M54.50 (ICD-10-CM) - Low back pain, unspecified back pain laterality, unspecified chronicity, unspecified whether sciatica present  M25.551 (ICD-10-CM) - Pain in right hip    THERAPY DIAG:  Other low back pain  Pain in left hip  Pain in right hip  Muscle weakness (generalized)  Difficulty in walking, not elsewhere classified  Repeated falls  Rationale for Evaluation and Treatment: Rehabilitation  ONSET DATE: Acute on chronic.  Fall reported 11/01/2023  SUBJECTIVE:   SUBJECTIVE STATEMENT: Pt had fall on 11/01/2023 with nondisplaced sacral fractures on MRI scan.  Pt had previous been seen by clinic for balance/ strengthening s/;p Lt hip fracture.  Has complaints of back pain, knee/foot pain on Rt.   Pt indicated having back/Rt hip pain today.   Pt has round the clock aide help at home.  Some cognitive difficulty in memory/understanding noted in conversation.   PERTINENT HISTORY:  Rt THA June 2024, Hx of dementia, anxiety, OA, osteoporosis, BPH, skin cancer, PSVT, chronic thrombocytopenia, shingles,   PAIN:  PRS scale: Mild to moderate.  Reported can be severe at times (unable to give numbers today in questioning)  Pain location: posterior Rt hip, back.    Pain description: aching at rest but can be sharp  Aggravating factors: Sometimes getting out of bed, sitting to standing Relieving factors: Heat, pain medication   PRECAUTIONS:  None current  RED FLAGS: None   WEIGHT BEARING RESTRICTIONS: WBAT  FALLS:  Has patient fallen in last 6 months? Yes. Number of falls multiple fall, one between last visit and today's eval.   Has a fear of falling.   LIVING ENVIRONMENT: Lives with: lives with their spouse Lives in: House/apartment Stairs: Ramped and level Has following equipment at home: Environmental consultant - 2 wheeled, Wheelchair (manual), and urinal and BSC, grab bars in bathroom but currently not using shower  OCCUPATION: Retired  PLOF: Independent with household mobility with device and Needs assistance with ADLs  PATIENT GOALS: Return to walking and get in/out of chair with less pain, stand  NEXT MD VISIT: n/a  OBJECTIVE:  Note: Objective measures were completed at Evaluation unless otherwise noted.  DIAGNOSTIC  FINDINGS: 1. Minimally displaced fracture through the base of the greater trochanter. No intertrochanteric extension is identified but fractures can be occult on CT, particularly in the elderly. If there is concern for further fracture, MRI without contrast is the best test for evaluation. 2. Mild left hip osteoarthritis. 3. Large stool ball in the rectosigmoid colon.  PATIENT SURVEYS:  The Patient-Specific Functional Scale   Eval: 12/29/2023   Standing independent 0   2.   Walking independent with FWW 0   3.   Transfers independent 0   4.   Stairs independent 0   AVERAGE       COGNITION: 12/29/2023 Overall cognitive status: Within functional limits for tasks assessed     SENSATION: 12/29/2023 [facility]   MUSCLE LENGTH: 12/29/2023 No specific testing  POSTURE: 12/29/2023 Forward head, rounded shoulders, decreased lumbar lordosis, increased thoracic kyphosis.  Posterior weight displacement in standing posture with  flexed trunk.   PALPATION: 12/29/2023 Did not assess  LOWER EXTREMITY ROM:   ROM Right Eval 12/29/2023 Left Eval 12/29/2023  Hip flexion    Hip extension    Hip abduction    Hip adduction    Hip internal rotation    Hip external rotation    Knee flexion    Knee extension    Ankle dorsiflexion    Ankle plantarflexion    Ankle inversion    Ankle eversion     (Blank rows = not tested)  LOWER EXTREMITY MMT:  MMT Right Eval 12/29/2023 Left Eval 12/29/2023  Hip flexion 4+/5 4+/5  Hip extension    Hip abduction Seated isometric (weak, mild pain) Seated isometric  (Weak, mild pain)  Hip adduction    Hip internal rotation    Hip external rotation    Knee flexion 4/5 4/5  Knee extension 4+/5 4+/5  Ankle dorsiflexion 4 4  Ankle plantarflexion  4-  Ankle inversion    Ankle eversion     (Blank rows = not tested, * = pain)   FUNCTIONAL TESTS:  12/29/2023 TUG with FWW and CGA:  1 min 45 seconds 18 inch chair transfer: unable without UE assist.   GAIT: 12/29/2023 Eval: Arrived in wheelchair.  Able to perform FWW ambulation with SBA to CGA at times.  Reduce step lengths bilateral, variab                    TREATMENT        DATE:  12/29/2023 Therex: HEP instruction and performance with cues for techniques.  Handout provided. Trial set of each performed with time for question and answer. Extra time required for instruction.  Nustep Lvl 5 8 mins UE/LE for Rom, aerobic exercise.     TherActivity Pivot transfer education with verbal cues , tactile cues .  Performed with SBA with FWW.  Performed to Lt and Rt 2 x each way in clinic visit today Sit to stand education on hips forward to correct posterior weight displacement in movement.  Performed x 5 in clinic today.   Gait Training Ambulation with CGA with FWW with constant cues verbally for foot placement, performed < 50 ft several times.      PATIENT EDUCATION:  Education details: Exam findings, POC, initial HEP,  importance of weightbearing Person educated: Patient  Education method: Explanation, Demonstration, Verbal cues, and Handouts Education comprehension: verbalized understanding, returned demonstration, and needs further education  HOME EXERCISE PROGRAM: Access Code: [email or web address] URL: [email or web address] Date: 10/29/2023 Prepared by: Ozell Silvan  Exercises - Supine Heel Slide  -  1 x daily - 7 x weekly - 1 sets - 10 reps - Small Range Straight Leg Raise  - 1 x daily - 7 x weekly - 1 sets - 10 reps - Proper Sit to Stand Technique  - 10 x daily - 7 x weekly - 1 sets - 2-3 reps - Supine Bridge with Resistance Band  - 1 x daily - 7 x weekly - 1 sets - 10 reps - Seated Long Arc Quad  - 1-2 x daily - 7 x weekly - 1-2 sets - 10 reps - 2 hold - Seated March  - 1-2 x daily - 7 x weekly - 1-2 sets - 10 reps - Seated Heel Toe Raises  - 1-2 x daily - 7 x weekly - 1-2 sets - 10 reps - Seated Hip Adduction Isometrics with Ball  - 1-2 x daily - 7 x weekly - 1-2 sets - 10 reps - 5 hold  ASSESSMENT:  CLINICAL IMPRESSION: Patient is a 83 y.o. who comes to clinic with complaints of back, bilateral hip/leg pain s/p past  Lt hip fracture and new onset of fall as documented with mobility, strength and movement coordination deficits that impair their ability to perform usual daily and recreational functional activities without increase difficulty/symptoms at this time.  Patient to benefit from skilled PT services to address impairments and limitations to improve to previous level of function without restriction secondary to condition.   OBJECTIVE IMPAIRMENTS: Abnormal gait, decreased activity tolerance, decreased balance, decreased coordination, decreased endurance, decreased mobility, difficulty walking, decreased ROM, decreased strength, hypomobility, increased fascial restrictions, impaired flexibility, improper body mechanics, postural dysfunction, and pain.   ACTIVITY LIMITATIONS: carrying,  lifting, bending, standing, squatting, stairs, transfers, bed mobility, bathing, toileting, dressing, hygiene/grooming, and locomotion level  PARTICIPATION LIMITATIONS: cleaning, interpersonal relationship, and community activity  PERSONAL FACTORS: Age, Fitness, Past/current experiences, Time since onset of injury/illness/exacerbation, and 1 comorbidity: R THA are also affecting patient's functional outcome.   REHAB POTENTIAL: Good  CLINICAL DECISION MAKING: Evolving/moderate complexity  EVALUATION COMPLEXITY: Moderate   GOALS: Goals reviewed with patient? Yes  SHORT TERM GOALS: Target date: 3 weeks : 01/19/2024    Pt will be ind with initial HEP Baseline: Goal status: New   LONG TERM GOALS: Target date: 10 weeks:  03/08/2024   Pt will be ind with management and progression of HEP Baseline:  Goal status:   New  2.  Pt will improve TUG < 30 seconds with FWW  to show improved gait speed, reduced fall risk.  Baseline:  Goal status:  New  3.  Pt will be able to amb at least 150' with RW modified independent for safe home amb Baseline:  Goal status: New  4.  Pt will have improved PSFS average score to > or = 5/10 on average to show reduced disability due to condition.  Baseline:  Goal status:  New  5.  Patient will demonstrate bilateral hip strength flexion 5/5, hip abduction 4/5 , knee extension and flexion 5/5 to facilitate progressive mobility improvements.  Baseline:  Goal status:  New    PLAN:  PT FREQUENCY: 2x/week  PT DURATION: 10 weeks  PLANNED INTERVENTIONS: 97164- PT Re-evaluation, 97750- Physical Performance Testing, 97110-Therapeutic exercises, 97530- Therapeutic activity, [id number]- Neuromuscular re-education, 97535- Self Care, 02859- Manual therapy, [phone]- Gait training, [phone]- Aquatic Therapy, [phone]- Electrical stimulation (unattended), 97016- Vasopneumatic device, [id number]- Ultrasound, [id number]- Ionotophoresis 4mg /ml Dexamethasone , 79439 (1-2 muscles), 20561  (3+ muscles)- Dry Needling, Patient/Family education, Balance training, Stair training, Taping,  Joint mobilization, Cryotherapy, and Moist heat  PLAN FOR NEXT SESSION:  Continued mobility improvements for safety.  Functional strengthening.  Possible bands added to walker for step length cues.   Could perform BERG (limited time for use today)  Ozell Silvan, PT, DPT, OCS, ATC 12/29/23  12:26 PM

## 2023-12-29 ENCOUNTER — Ambulatory Visit (INDEPENDENT_AMBULATORY_CARE_PROVIDER_SITE_OTHER): Admitting: Rehabilitative and Restorative Service Providers"

## 2023-12-29 ENCOUNTER — Encounter: Payer: Self-pay | Admitting: Rehabilitative and Restorative Service Providers"

## 2023-12-29 DIAGNOSIS — M25552 Pain in left hip: Secondary | ICD-10-CM

## 2023-12-29 DIAGNOSIS — M5459 Other low back pain: Secondary | ICD-10-CM | POA: Diagnosis not present

## 2023-12-29 DIAGNOSIS — M6281 Muscle weakness (generalized): Secondary | ICD-10-CM | POA: Diagnosis not present

## 2023-12-29 DIAGNOSIS — R262 Difficulty in walking, not elsewhere classified: Secondary | ICD-10-CM

## 2023-12-29 DIAGNOSIS — M25551 Pain in right hip: Secondary | ICD-10-CM | POA: Diagnosis not present

## 2023-12-29 DIAGNOSIS — R296 Repeated falls: Secondary | ICD-10-CM

## 2024-01-04 ENCOUNTER — Ambulatory Visit (INDEPENDENT_AMBULATORY_CARE_PROVIDER_SITE_OTHER): Admitting: Psychology

## 2024-01-04 DIAGNOSIS — F0392 Unspecified dementia, unspecified severity, with psychotic disturbance: Secondary | ICD-10-CM

## 2024-01-04 NOTE — Progress Notes (Signed)
   NEUROPSYCHOLOGY FEEDBACK SESSION [facility]  [place] Department of Neurology  Date of Feedback Session: 01/04/2024  REASON FOR REFERRAL   Lee Nelson is an 83 year old, right-handed, White male with 19 years of formal education. He was referred for neuropsychological evaluation by Camie Sevin, PA-C, to assess current neurocognitive functioning, document potential cognitive deficits, and assist with treatment planning. Patient is a poor historian, making it difficult to determine whether any prior neuropsychological testing has been completed or if he was simply referencing cognitive screenings. A review of his medical records revealed no documentation of previous neuropsychological evaluations.  FEEDBACK   Patient completed a comprehensive neuropsychological evaluation on 12/22/2023. Please refer to that encounter for the full report and recommendations. Briefly, results  indicated cognitive performance that was largely below expectations, with only a few isolated areas of preserved functioning. This global decline is especially notable given his presumed high cognitive baseline. At this time, he also requires assistance with all instrumental activities of daily living. Given evidence of both cognitive and functional decline, findings support a diagnosis of dementia. Etiology of his cognitive impairment is somewhat unclear. Neuroimaging indicated ventriculomegaly, and clinically he exhibits the classic triad associated with NPH. However, it remains unclear from available records whether he has ever received a formal NPH diagnosis or undergone a high-volume lumbar puncture. Given the global nature of his cognitive decline, along with pertinent background information, there is also concern for a possible underlying neurodegenerative process, such as Alzheimer's disease. Other contributing factors to the overall clinical picture may include mood disturbance, disrupted sleep, and  the use of numerous over-the-counter supplements, the latter of which is concerning due to the unknown potential effects and interactions on his overall health and cognition.  Today, the patient was accompanied by his wife and their two caregivers. They were provided verbal feedback regarding the findings and impression during this visit, and their questions were answered. A copy of the report was provided at the conclusion of the visit.  DISPOSITION   Patient will follow up with the referring provider, Ms. Wertman. No follow-up neuropsychological testing was scheduled at this time.  SERVICE   This feedback session was conducted by Renda Beckwith, Psy.D. One unit of 03867 and one unit of 03866 (95 minutes) were billed for Dr. Beckwith' time spent in preparing, conducting, and documenting the current feedback session.  This report was generated using voice recognition software. While this document has been carefully reviewed, transcription errors may be present. I apologize in advance for any inconvenience. Please contact me if further clarification is needed.

## 2024-01-07 ENCOUNTER — Other Ambulatory Visit (INDEPENDENT_AMBULATORY_CARE_PROVIDER_SITE_OTHER): Payer: Self-pay

## 2024-01-07 ENCOUNTER — Ambulatory Visit (INDEPENDENT_AMBULATORY_CARE_PROVIDER_SITE_OTHER): Admitting: Orthopaedic Surgery

## 2024-01-07 ENCOUNTER — Encounter: Payer: Self-pay | Admitting: Orthopaedic Surgery

## 2024-01-07 DIAGNOSIS — M25511 Pain in right shoulder: Secondary | ICD-10-CM

## 2024-01-07 DIAGNOSIS — G8929 Other chronic pain: Secondary | ICD-10-CM

## 2024-01-07 DIAGNOSIS — M5441 Lumbago with sciatica, right side: Secondary | ICD-10-CM | POA: Diagnosis not present

## 2024-01-07 DIAGNOSIS — Z96641 Presence of right artificial hip joint: Secondary | ICD-10-CM

## 2024-01-07 NOTE — Progress Notes (Signed)
 The patient is only 83 year old gentleman who was brought in today with right hip pain and right shoulder pain.  We actually replaced his right hip back in June of last year after a mechanical fall in which she sustained a femoral neck fracture.  He did have a fall in September and saw Dr. Addie.  He fell again a couple days ago.  His caregiver is with him and said that they have been using Voltaren gel and Salonpas patches.  He does have hydrocodone  as well.  He is not interested in any type of steroid.  He does ambulate with a walker at home and tries to be careful.  He does put pressure on his right hip in a hospital bed that he sleeps and at night.  He does feel like he is feeling a little bit better since he made this appointment.  The right shoulder is well located as is the right hip.  They move smoothly but he does have obvious evidence of some chronic rotator cuff tearing and tendinopathy of the right shoulder.    x-rays of the right shoulder show a well located shoulder with no fracture.  There is calcifications at the footprint of the rotator cuff suggesting rotator cuff tendinitis and calcific tendinitis.  Most of his pain seems to be in the low back to the right side and not in the hip itself.  His hip is moving smoothly and fluidly.  X-rays of the pelvis and right hip show well-seated right total hip arthroplasty.  He should continue conservative treatment since he is feeling better and just topical anti-inflammatories and patches for now.  If things worsen to the point he would like to consider injection they can come back and see us .

## 2024-01-19 ENCOUNTER — Ambulatory Visit: Admitting: Rehabilitative and Restorative Service Providers"

## 2024-01-19 ENCOUNTER — Encounter: Payer: Self-pay | Admitting: Rehabilitative and Restorative Service Providers"

## 2024-01-19 DIAGNOSIS — R262 Difficulty in walking, not elsewhere classified: Secondary | ICD-10-CM

## 2024-01-19 DIAGNOSIS — M5459 Other low back pain: Secondary | ICD-10-CM

## 2024-01-19 DIAGNOSIS — R296 Repeated falls: Secondary | ICD-10-CM

## 2024-01-19 DIAGNOSIS — M25552 Pain in left hip: Secondary | ICD-10-CM | POA: Diagnosis not present

## 2024-01-19 DIAGNOSIS — M25551 Pain in right hip: Secondary | ICD-10-CM

## 2024-01-19 DIAGNOSIS — M6281 Muscle weakness (generalized): Secondary | ICD-10-CM

## 2024-01-19 NOTE — Therapy (Signed)
 OUTPATIENT PHYSICAL THERAPY TREATMENT   Patient Name: Lee Nelson MRN: [id number] DOB:[birth year], 83 y.o., male Today's Date: 01/19/2024  END OF SESSION:  PT End of Session - 01/19/24 1222     Visit Number 2    Number of Visits 20    Date for Recertification  03/08/24    Authorization Type Medicare    Progress Note Due on Visit 10    PT Start Time 1147    PT Stop Time 1228    PT Time Calculation (min) 41 min    Activity Tolerance Patient tolerated treatment well    Behavior During Therapy [facility] for tasks assessed/performed                 Past Medical History:  Diagnosis Date   Anxiety    Arthritis    Bladder calculus    BPH (benign prostatic hyperplasia)    Chronic constipation    Complication of anesthesia     I had some coughing afterwards for a couple of days--  per pt perfers spinal anesthesia since general anesthesia congnitive issues when older   Diverticulosis of colon    Dry eye syndrome of both eyes    Environmental allergies    GERD (gastroesophageal reflux disease)    occasional   History of adenomatous polyp of colon    08/ 2004   History of kidney stones    History of squamous cell carcinoma in situ (SCCIS) of skin    s/p  excision 2013 facial areas and 06/ 2016 nose   Migraine    eye migraine occasional   Seasonal and perennial allergic rhinitis    Thrombocytopenia    Tingling    hands and feet bilat , intermittantly-- per pt has lumbar bulging disk   Urinary frequency    Vocal fold atrophy    dysphonia-- per pt has to drink large amount of water  to take even on pill   Wears glasses    Past Surgical History:  Procedure Laterality Date   APPENDECTOMY  child   CARDIOVASCULAR STRESS TEST  05-17-2015  dr hilty   Low risk nuclear study w/ no ischemia/  normal LV function and wall motion , stress ef 54% (lvef 45-54%)   CHOLECYSTECTOMY N/A 09/29/2014   Procedure: LAPAROSCOPIC CHOLECYSTECTOMY WITH INTRAOPERATIVE CHOLANGIOGRAM;  Surgeon:  Alm Angle, MD;  Location: [facility];  Service: General;  Laterality: N/A;   COLONOSCOPY  last one 09-06-2010   ESOPHAGOGASTRODUODENOSCOPY N/A 12/09/2013   Procedure: ESOPHAGOGASTRODUODENOSCOPY (EGD);  Surgeon: Elsie Cree, MD;  Location: [facility] ENDOSCOPY;  Service: Endoscopy;  Laterality: N/A;   EXTRACORPOREAL SHOCK WAVE LITHOTRIPSY  yrs ago   INGUINAL HERNIA REPAIR Left child   inguinal hernia repair  09/2017   NISSEN FUNDOPLICATION  1980's   open   STONE EXTRACTION WITH BASKET N/A 08/18/2016   Procedure: STONE EXTRACTION WITH BASKET;  Surgeon: Chales Idol, MD;  Location: [facility];  Service: Urology;  Laterality: N/A;   THULIUM LASER TURP (TRANSURETHRAL RESECTION OF PROSTATE) N/A 08/18/2016   Procedure: THULIUM LASER BLADDER NECK INCISION AND BLADDER STONE REMOVAL;  Surgeon: Chales Idol, MD;  Location: [facility];  Service: Urology;  Laterality: N/A;   TONSILLECTOMY  child   TOTAL HIP ARTHROPLASTY Right 09/07/2022   Procedure: TOTAL HIP ARTHROPLASTY ANTERIOR APPROACH;  Surgeon: Vernetta Lonni GRADE, MD;  Location: [facility];  Service: Orthopedics;  Laterality: Right;   TRANSTHORACIC ECHOCARDIOGRAM  11/18/2010   grade 1 diastolic dysfunction, ef 55-60%/  trivial  MR and TR/ mild dilated RA   Patient Active Problem List   Diagnosis Date Noted   Status post total replacement of right hip 09/22/2022   Protein-calorie malnutrition, severe 09/08/2022   Hip fracture (HCC) 09/07/2022   BPH (benign prostatic hyperplasia) 09/07/2022   Closed fracture of right femur, unspecified fracture morphology, initial encounter (HCC) 09/07/2022   Fall 09/07/2022   Closed subcapital fracture of neck of femur, right, initial encounter (HCC) 09/07/2022   Memory impairment 08/06/2022   Pneumonia due to COVID-19 virus 05/06/2022   Severe sepsis (HCC) 05/06/2022   Acute hypoxemic respiratory failure (HCC) 05/06/2022   NPH (normal pressure hydrocephalus) (HCC)  05/06/2022   Physical deconditioning 05/06/2022   Thrombocytopenia 05/06/2022   Dementia (HCC) 05/06/2022   SBO (small bowel obstruction) (HCC) 12/31/2019   Amaurosis fugax of right eye 08/25/2017   PSVT (paroxysmal supraventricular tachycardia) 09/12/2015   Chest pain 05/03/2015   Dyspnea 05/03/2015   ALLERGIC RHINITIS 09/21/2007   G E R D 09/21/2007   SMOKE INHALATION 09/21/2007   OSTEOPOROSIS 01/28/2007   PALPITATIONS 01/28/2007   COUGH 01/28/2007   ALLERGY 01/28/2007    PCP: Janey Santos, MD  REFERRING PROVIDER: Addie Cordella Hamilton, MD   REFERRING DIAG:  W19.XXXD (ICD-10-CM) - Fall, subsequent encounter  M79.671 (ICD-10-CM) - Pain in right foot  M25.561 (ICD-10-CM) - Right knee pain, unspecified chronicity  M54.50 (ICD-10-CM) - Low back pain, unspecified back pain laterality, unspecified chronicity, unspecified whether sciatica present  M25.551 (ICD-10-CM) - Pain in right hip    THERAPY DIAG:  Other low back pain  Pain in left hip  Pain in right hip  Muscle weakness (generalized)  Difficulty in walking, not elsewhere classified  Repeated falls  Rationale for Evaluation and Treatment: Rehabilitation  ONSET DATE: Acute on chronic.  Fall reported 11/01/2023  SUBJECTIVE:   SUBJECTIVE STATEMENT: Pt indicated no specific pain upon arrival today.  Pt indicated no fall since last visit.   PERTINENT HISTORY:  Rt THA June 2024, Hx of dementia, anxiety, OA, osteoporosis, BPH, skin cancer, PSVT, chronic thrombocytopenia, shingles,   PAIN:  PRS scale: no specific pain.  Pain location: posterior Rt hip, back.   Pain description: aching at rest but can be sharp  Aggravating factors: Sometimes getting out of bed, sitting to standing Relieving factors: Heat, pain medication   PRECAUTIONS:  None current  RED FLAGS: None   WEIGHT BEARING RESTRICTIONS: WBAT  FALLS:  Has patient fallen in last 6 months? Yes. Number of falls multiple fall, one between last visit  and today's eval.   Has a fear of falling.   LIVING ENVIRONMENT: Lives with: lives with their spouse Lives in: House/apartment Stairs: Ramped and level Has following equipment at home: Environmental consultant - 2 wheeled, Wheelchair (manual), and urinal and BSC, grab bars in bathroom but currently not using shower  OCCUPATION: Retired  PLOF: Independent with household mobility with device and Needs assistance with ADLs  PATIENT GOALS: Return to walking and get in/out of chair with less pain, stand  NEXT MD VISIT: n/a  OBJECTIVE:  Note: Objective measures were completed at Evaluation unless otherwise noted.  DIAGNOSTIC FINDINGS: 1. Minimally displaced fracture through the base of the greater trochanter. No intertrochanteric extension is identified but fractures can be occult on CT, particularly in the elderly. If there is concern for further fracture, MRI without contrast is the best test for evaluation. 2. Mild left hip osteoarthritis. 3. Large stool ball in the rectosigmoid colon.  PATIENT SURVEYS:  The Patient-Specific  Functional Scale   Eval: 12/29/2023   Standing independent 0   2.   Walking independent with FWW 0   3.   Transfers independent 0   4.   Stairs independent 0   AVERAGE       COGNITION: 12/29/2023 Overall cognitive status: Within functional limits for tasks assessed     SENSATION: 12/29/2023 [facility]   MUSCLE LENGTH: 12/29/2023 No specific testing  POSTURE: 12/29/2023 Forward head, rounded shoulders, decreased lumbar lordosis, increased thoracic kyphosis.  Posterior weight displacement in standing posture with flexed trunk.   PALPATION: 12/29/2023 Did not assess  LOWER EXTREMITY ROM:   ROM Right Eval 12/29/2023 Left Eval 12/29/2023  Hip flexion    Hip extension    Hip abduction    Hip adduction    Hip internal rotation    Hip external rotation    Knee flexion    Knee extension    Ankle dorsiflexion    Ankle plantarflexion    Ankle inversion    Ankle  eversion     (Blank rows = not tested)  LOWER EXTREMITY MMT:  MMT Right Eval 12/29/2023 Left Eval 12/29/2023  Hip flexion 4+/5 4+/5  Hip extension    Hip abduction Seated isometric (weak, mild pain) Seated isometric  (Weak, mild pain)  Hip adduction    Hip internal rotation    Hip external rotation    Knee flexion 4/5 4/5  Knee extension 4+/5 4+/5  Ankle dorsiflexion 4 4  Ankle plantarflexion  4-  Ankle inversion    Ankle eversion     (Blank rows = not tested, * = pain)   FUNCTIONAL TESTS:  01/19/2024:    01/19/24 0001  Balance  Balance Assessed Yes  Standardized Balance Assessment  Standardized Balance Assessment Berg Balance Test  Berg Balance Test  Sit to Stand 2  Standing Unsupported 1  Sitting with Back Unsupported but Feet Supported on Floor or Stool 4  Stand to Sit 1  Transfers 2  Standing Unsupported with Eyes Closed 0  Standing Unsupported with Feet Together 0  From Standing, Reach Forward with Outstretched Arm 1  From Standing Position, Pick up Object from Floor 0  From Standing Position, Turn to Look Behind Over each Shoulder 1  Turn 360 Degrees 0  Standing Unsupported, Alternately Place Feet on Step/Stool 1  Standing Unsupported, One Foot in Front 0  Standing on One Leg 0  Total Score 13      12/29/2023 TUG with FWW and CGA:  1 min 45 seconds 18 inch chair transfer: unable without UE assist.   GAIT: 12/29/2023 Eval: Arrived in wheelchair.  Able to perform FWW ambulation with SBA to CGA at times.  Reduce step lengths bilateral, variab                    TREATMENT        DATE:  01/19/2024  Neuro Re-ed (balance control, weight shift improvement, postural awareness/correction  BIG inspired stepping forward stepping x 12 bilateral for weight shift improvements.  In // bars with CGA to min A with constant verbal cues.   BIG inspired stepping forward stepping x 12 bilateral for weight shift improvements.  In // bars with CGA to min A with constant  verbal cues.  Elevated bar stool height sit to stand no UE assist with MIN A mostly from clinician x 10.  Encouragement for anterior weight displacement.   TherActivity Cues verbally and visually for sit to stand to sit from  wheelchair with hand placement on chair handrails rather than use of walker or // bars to promote improved safety in movement.  Practiced 10-12x in clinic with cues consistently necessary.    Physical Performance Testing: Berg balance testing with additional time for rest, education of techniques for testing. And performance.      TREATMENT        DATE:  12/29/2023 Therex: HEP instruction and performance with cues for techniques.  Handout provided. Trial set of each performed with time for question and answer. Extra time required for instruction.  Nustep Lvl 5 8 mins UE/LE for Rom, aerobic exercise.     TherActivity Pivot transfer education with verbal cues , tactile cues .  Performed with SBA with FWW.  Performed to Lt and Rt 2 x each way in clinic visit today Sit to stand education on hips forward to correct posterior weight displacement in movement.  Performed x 5 in clinic today.   Gait Training Ambulation with CGA with FWW with constant cues verbally for foot placement, performed < 50 ft several times.      PATIENT EDUCATION:  Education details: Exam findings, POC, initial HEP, importance of weightbearing Person educated: Patient  Education method: Explanation, Demonstration, Verbal cues, and Handouts Education comprehension: verbalized understanding, returned demonstration, and needs further education  HOME EXERCISE PROGRAM: Access Code: [email or web address] URL: [email or web address] Date: 10/29/2023 Prepared by: Ozell Silvan  Exercises - Supine Heel Slide  - 1 x daily - 7 x weekly - 1 sets - 10 reps - Small Range Straight Leg Raise  - 1 x daily - 7 x weekly - 1 sets - 10 reps - Proper Sit to Stand Technique  - 10 x daily - 7 x weekly - 1  sets - 2-3 reps - Supine Bridge with Resistance Band  - 1 x daily - 7 x weekly - 1 sets - 10 reps - Seated Long Arc Quad  - 1-2 x daily - 7 x weekly - 1-2 sets - 10 reps - 2 hold - Seated March  - 1-2 x daily - 7 x weekly - 1-2 sets - 10 reps - Seated Heel Toe Raises  - 1-2 x daily - 7 x weekly - 1-2 sets - 10 reps - Seated Hip Adduction Isometrics with Ball  - 1-2 x daily - 7 x weekly - 1-2 sets - 10 reps - 5 hold  ASSESSMENT:  CLINICAL IMPRESSION: Pt has continued poor postural control for upright standing positioning with noted difficulty controlling posterior weight distribution.  Poor ankle/hip and stepping strategies.  Berg updated today and high risk of falls indicated.   Constant cues in visit to keep on task and improve techniques of intervention.  HEP compliant lacking.  Provided handout again.    OBJECTIVE IMPAIRMENTS: Abnormal gait, decreased activity tolerance, decreased balance, decreased coordination, decreased endurance, decreased mobility, difficulty walking, decreased ROM, decreased strength, hypomobility, increased fascial restrictions, impaired flexibility, improper body mechanics, postural dysfunction, and pain.   ACTIVITY LIMITATIONS: carrying, lifting, bending, standing, squatting, stairs, transfers, bed mobility, bathing, toileting, dressing, hygiene/grooming, and locomotion level  PARTICIPATION LIMITATIONS: cleaning, interpersonal relationship, and community activity  PERSONAL FACTORS: Age, Fitness, Past/current experiences, Time since onset of injury/illness/exacerbation, and 1 comorbidity: R THA are also affecting patient's functional outcome.   REHAB POTENTIAL: Good  CLINICAL DECISION MAKING: Evolving/moderate complexity  EVALUATION COMPLEXITY: Moderate   GOALS: Goals reviewed with patient? Yes  SHORT TERM GOALS: Target date: 3 weeks : 01/19/2024    Pt will be ind  with initial HEP Baseline: Goal status: not met - cues still needed   LONG TERM GOALS:  Target date: 10 weeks:  03/08/2024   Pt will be ind with management and progression of HEP Baseline:  Goal status:   New  2.  Pt will improve TUG < 30 seconds with FWW  to show improved gait speed, reduced fall risk.  Baseline:  Goal status:  New  3.  Pt will be able to amb at least 150' with RW modified independent for safe home amb Baseline:  Goal status: New  4.  Pt will have improved PSFS average score to > or = 5/10 on average to show reduced disability due to condition.  Baseline:  Goal status:  New  5.  Patient will demonstrate bilateral hip strength flexion 5/5, hip abduction 4/5 , knee extension and flexion 5/5 to facilitate progressive mobility improvements.  Baseline:  Goal status:  New    PLAN:  PT FREQUENCY: 2x/week  PT DURATION: 10 weeks  PLANNED INTERVENTIONS: 97164- PT Re-evaluation, 97750- Physical Performance Testing, 97110-Therapeutic exercises, 97530- Therapeutic activity, [id number]- Neuromuscular re-education, 97535- Self Care, 02859- Manual therapy, [phone]- Gait training, [phone]- Aquatic Therapy, [phone]- Electrical stimulation (unattended), 97016- Vasopneumatic device, [id number]- Ultrasound, [id number]- Ionotophoresis 4mg /ml Dexamethasone , 79439 (1-2 muscles), 20561 (3+ muscles)- Dry Needling, Patient/Family education, Balance training, Stair training, Taping, Joint mobilization, Cryotherapy, and Moist heat  PLAN FOR NEXT SESSION:  work on gastroc and hip flexor tightness, postural awareness control.   Ozell Silvan, PT, DPT, OCS, ATC 01/19/24  12:46 PM

## 2024-01-20 ENCOUNTER — Telehealth: Payer: Self-pay | Admitting: Physician Assistant

## 2024-01-20 NOTE — Telephone Encounter (Signed)
 Anxiety, not able to sleep. Various, eating fine. Lots of anxiety.Gave permission to talk to caretaker,

## 2024-01-20 NOTE — Telephone Encounter (Signed)
 Who's calling (name and relationship to patient) : Bobbette; Called along with Mr.  Dreshaun Stene contact number: [phone]  Provider they see: Wertman, S. PA  Reason for call: Regarding medication.    Call ID:      PRESCRIPTION REFILL ONLY  Name of prescription:  Pharmacy:

## 2024-01-20 NOTE — Telephone Encounter (Signed)
 I tried to call no answer, will call back.

## 2024-01-21 ENCOUNTER — Encounter: Payer: Self-pay | Admitting: Rehabilitative and Restorative Service Providers"

## 2024-01-21 ENCOUNTER — Other Ambulatory Visit: Payer: Self-pay

## 2024-01-21 ENCOUNTER — Ambulatory Visit: Admitting: Rehabilitative and Restorative Service Providers"

## 2024-01-21 DIAGNOSIS — M25551 Pain in right hip: Secondary | ICD-10-CM | POA: Diagnosis not present

## 2024-01-21 DIAGNOSIS — M5459 Other low back pain: Secondary | ICD-10-CM

## 2024-01-21 DIAGNOSIS — R262 Difficulty in walking, not elsewhere classified: Secondary | ICD-10-CM

## 2024-01-21 DIAGNOSIS — R296 Repeated falls: Secondary | ICD-10-CM

## 2024-01-21 DIAGNOSIS — F0392 Unspecified dementia, unspecified severity, with psychotic disturbance: Secondary | ICD-10-CM

## 2024-01-21 DIAGNOSIS — M25552 Pain in left hip: Secondary | ICD-10-CM

## 2024-01-21 DIAGNOSIS — M6281 Muscle weakness (generalized): Secondary | ICD-10-CM | POA: Diagnosis not present

## 2024-01-21 DIAGNOSIS — R413 Other amnesia: Secondary | ICD-10-CM

## 2024-01-21 NOTE — Therapy (Signed)
 OUTPATIENT PHYSICAL THERAPY TREATMENT   Patient Name: Lee Nelson MRN: [id number] DOB:[birth year], 83 y.o., male Today's Date: 01/21/2024  END OF SESSION:  PT End of Session - 01/21/24 1156     Visit Number 3    Number of Visits 20    Date for Recertification  03/08/24    Authorization Type Medicare    Progress Note Due on Visit 10    PT Start Time 1145    PT Stop Time 1225    PT Time Calculation (min) 40 min    Activity Tolerance Patient tolerated treatment well    Behavior During Therapy [facility] for tasks assessed/performed                  Past Medical History:  Diagnosis Date   Anxiety    Arthritis    Bladder calculus    BPH (benign prostatic hyperplasia)    Chronic constipation    Complication of anesthesia     I had some coughing afterwards for a couple of days--  per pt perfers spinal anesthesia since general anesthesia congnitive issues when older   Diverticulosis of colon    Dry eye syndrome of both eyes    Environmental allergies    GERD (gastroesophageal reflux disease)    occasional   History of adenomatous polyp of colon    08/ 2004   History of kidney stones    History of squamous cell carcinoma in situ (SCCIS) of skin    s/p  excision 2013 facial areas and 06/ 2016 nose   Migraine    eye migraine occasional   Seasonal and perennial allergic rhinitis    Thrombocytopenia    Tingling    hands and feet bilat , intermittantly-- per pt has lumbar bulging disk   Urinary frequency    Vocal fold atrophy    dysphonia-- per pt has to drink large amount of water  to take even on pill   Wears glasses    Past Surgical History:  Procedure Laterality Date   APPENDECTOMY  child   CARDIOVASCULAR STRESS TEST  05-17-2015  dr hilty   Low risk nuclear study w/ no ischemia/  normal LV function and wall motion , stress ef 54% (lvef 45-54%)   CHOLECYSTECTOMY N/A 09/29/2014   Procedure: LAPAROSCOPIC CHOLECYSTECTOMY WITH INTRAOPERATIVE CHOLANGIOGRAM;   Surgeon: Alm Angle, MD;  Location: [facility];  Service: General;  Laterality: N/A;   COLONOSCOPY  last one 09-06-2010   ESOPHAGOGASTRODUODENOSCOPY N/A 12/09/2013   Procedure: ESOPHAGOGASTRODUODENOSCOPY (EGD);  Surgeon: Elsie Cree, MD;  Location: [facility] ENDOSCOPY;  Service: Endoscopy;  Laterality: N/A;   EXTRACORPOREAL SHOCK WAVE LITHOTRIPSY  yrs ago   INGUINAL HERNIA REPAIR Left child   inguinal hernia repair  09/2017   NISSEN FUNDOPLICATION  1980's   open   STONE EXTRACTION WITH BASKET N/A 08/18/2016   Procedure: STONE EXTRACTION WITH BASKET;  Surgeon: Chales Idol, MD;  Location: [facility];  Service: Urology;  Laterality: N/A;   THULIUM LASER TURP (TRANSURETHRAL RESECTION OF PROSTATE) N/A 08/18/2016   Procedure: THULIUM LASER BLADDER NECK INCISION AND BLADDER STONE REMOVAL;  Surgeon: Chales Idol, MD;  Location: [facility];  Service: Urology;  Laterality: N/A;   TONSILLECTOMY  child   TOTAL HIP ARTHROPLASTY Right 09/07/2022   Procedure: TOTAL HIP ARTHROPLASTY ANTERIOR APPROACH;  Surgeon: Vernetta Lonni GRADE, MD;  Location: [facility];  Service: Orthopedics;  Laterality: Right;   TRANSTHORACIC ECHOCARDIOGRAM  11/18/2010   grade 1 diastolic dysfunction, ef 55-60%/  trivial MR and TR/ mild dilated RA   Patient Active Problem List   Diagnosis Date Noted   Status post total replacement of right hip 09/22/2022   Protein-calorie malnutrition, severe 09/08/2022   Hip fracture (HCC) 09/07/2022   BPH (benign prostatic hyperplasia) 09/07/2022   Closed fracture of right femur, unspecified fracture morphology, initial encounter (HCC) 09/07/2022   Fall 09/07/2022   Closed subcapital fracture of neck of femur, right, initial encounter (HCC) 09/07/2022   Memory impairment 08/06/2022   Pneumonia due to COVID-19 virus 05/06/2022   Severe sepsis (HCC) 05/06/2022   Acute hypoxemic respiratory failure (HCC) 05/06/2022   NPH (normal pressure hydrocephalus) (HCC)  05/06/2022   Physical deconditioning 05/06/2022   Thrombocytopenia 05/06/2022   Dementia (HCC) 05/06/2022   SBO (small bowel obstruction) (HCC) 12/31/2019   Amaurosis fugax of right eye 08/25/2017   PSVT (paroxysmal supraventricular tachycardia) 09/12/2015   Chest pain 05/03/2015   Dyspnea 05/03/2015   ALLERGIC RHINITIS 09/21/2007   G E R D 09/21/2007   SMOKE INHALATION 09/21/2007   OSTEOPOROSIS 01/28/2007   PALPITATIONS 01/28/2007   COUGH 01/28/2007   ALLERGY 01/28/2007    PCP: Janey Santos, MD  REFERRING PROVIDER: Addie Cordella Hamilton, MD   REFERRING DIAG:  W19.XXXD (ICD-10-CM) - Fall, subsequent encounter  M79.671 (ICD-10-CM) - Pain in right foot  M25.561 (ICD-10-CM) - Right knee pain, unspecified chronicity  M54.50 (ICD-10-CM) - Low back pain, unspecified back pain laterality, unspecified chronicity, unspecified whether sciatica present  M25.551 (ICD-10-CM) - Pain in right hip    THERAPY DIAG:  Other low back pain  Pain in left hip  Pain in right hip  Muscle weakness (generalized)  Difficulty in walking, not elsewhere classified  Repeated falls  Rationale for Evaluation and Treatment: Rehabilitation  ONSET DATE: Acute on chronic.  Fall reported 11/01/2023  SUBJECTIVE:   SUBJECTIVE STATEMENT: Pt indicated having some back complaints with movement.  Reported doing some of the home stuff.   PERTINENT HISTORY:  Rt THA June 2024, Hx of dementia, anxiety, OA, osteoporosis, BPH, skin cancer, PSVT, chronic thrombocytopenia, shingles,   PAIN:  PRS scale: no specific pain.  Pain location: posterior Rt hip, back.   Pain description: aching at rest but can be sharp  Aggravating factors: Sometimes getting out of bed, sitting to standing Relieving factors: Heat, pain medication   PRECAUTIONS:  None current  RED FLAGS: None   WEIGHT BEARING RESTRICTIONS: WBAT  FALLS:  Has patient fallen in last 6 months? Yes. Number of falls multiple fall, one between last  visit and today's eval.   Has a fear of falling.   LIVING ENVIRONMENT: Lives with: lives with their spouse Lives in: House/apartment Stairs: Ramped and level Has following equipment at home: Environmental consultant - 2 wheeled, Wheelchair (manual), and urinal and BSC, grab bars in bathroom but currently not using shower  OCCUPATION: Retired  PLOF: Independent with household mobility with device and Needs assistance with ADLs  PATIENT GOALS: Return to walking and get in/out of chair with less pain, stand  NEXT MD VISIT: n/a  OBJECTIVE:  Note: Objective measures were completed at Evaluation unless otherwise noted.  DIAGNOSTIC FINDINGS: 1. Minimally displaced fracture through the base of the greater trochanter. No intertrochanteric extension is identified but fractures can be occult on CT, particularly in the elderly. If there is concern for further fracture, MRI without contrast is the best test for evaluation. 2. Mild left hip osteoarthritis. 3. Large stool ball in the rectosigmoid colon.  PATIENT SURVEYS:  The  Patient-Specific Functional Scale   Eval: 12/29/2023   Standing independent 0   2.   Walking independent with FWW 0   3.   Transfers independent 0   4.   Stairs independent 0   AVERAGE       COGNITION: 12/29/2023 Overall cognitive status: Within functional limits for tasks assessed     SENSATION: 12/29/2023 [facility]   MUSCLE LENGTH: 12/29/2023 No specific testing  POSTURE: 12/29/2023 Forward head, rounded shoulders, decreased lumbar lordosis, increased thoracic kyphosis.  Posterior weight displacement in standing posture with flexed trunk.   PALPATION: 12/29/2023 Did not assess  LOWER EXTREMITY ROM:   ROM Right Eval 12/29/2023 Left Eval 12/29/2023  Hip flexion    Hip extension    Hip abduction    Hip adduction    Hip internal rotation    Hip external rotation    Knee flexion    Knee extension    Ankle dorsiflexion    Ankle plantarflexion    Ankle inversion     Ankle eversion     (Blank rows = not tested)  LOWER EXTREMITY MMT:  MMT Right Eval 12/29/2023 Left Eval 12/29/2023  Hip flexion 4+/5 4+/5  Hip extension    Hip abduction Seated isometric (weak, mild pain) Seated isometric  (Weak, mild pain)  Hip adduction    Hip internal rotation    Hip external rotation    Knee flexion 4/5 4/5  Knee extension 4+/5 4+/5  Ankle dorsiflexion 4 4  Ankle plantarflexion  4-  Ankle inversion    Ankle eversion     (Blank rows = not tested, * = pain)   FUNCTIONAL TESTS:  01/19/2024:    01/19/24 0001  Balance  Balance Assessed Yes  Standardized Balance Assessment  Standardized Balance Assessment Berg Balance Test  Berg Balance Test  Sit to Stand 2  Standing Unsupported 1  Sitting with Back Unsupported but Feet Supported on Floor or Stool 4  Stand to Sit 1  Transfers 2  Standing Unsupported with Eyes Closed 0  Standing Unsupported with Feet Together 0  From Standing, Reach Forward with Outstretched Arm 1  From Standing Position, Pick up Object from Floor 0  From Standing Position, Turn to Look Behind Over each Shoulder 1  Turn 360 Degrees 0  Standing Unsupported, Alternately Place Feet on Step/Stool 1  Standing Unsupported, One Foot in Front 0  Standing on One Leg 0  Total Score 13      12/29/2023 TUG with FWW and CGA:  1 min 45 seconds 18 inch chair transfer: unable without UE assist.   GAIT: 12/29/2023 Eval: Arrived in wheelchair.  Able to perform FWW ambulation with SBA to CGA at times.  Reduce step lengths bilateral, variab                    TREATMENT        DATE:  01/21/2024 Therex: Supine thomas hip flexor stretch with contralateral foot on table 30 sec x 3 bilaterally (time for cues) Sitting gastroc stretch 30 sec x 3 bilateral (time for cues)  Gait Training Ambulation household distances < 100 ft x 2 with cues for foot placement, CGA for safety.    TherActivity Wheelchair to chair to wheelchair side transfers both  directions x 2  with cues for techniques, hand placement and SBA for safety.  Consistent cues required throughout movement.  Supine to sit to supine transfers with log rolling technique focus.  Verbal and visual and tactile cues required  throughout movement.  Slow movement pattern due to confusion in activity despite cues.  Supine bridge x 15 for hip mobility and bed mobility improvements.     TREATMENT        DATE:  01/19/2024  Neuro Re-ed (balance control, weight shift improvement, postural awareness/correction  BIG inspired stepping forward stepping x 12 bilateral for weight shift improvements.  In // bars with CGA to min A with constant verbal cues.   BIG inspired stepping forward stepping x 12 bilateral for weight shift improvements.  In // bars with CGA to min A with constant verbal cues.  Elevated bar stool height sit to stand no UE assist with MIN A mostly from clinician x 10.  Encouragement for anterior weight displacement.   TherActivity Cues verbally and visually for sit to stand to sit from wheelchair with hand placement on chair handrails rather than use of walker or // bars to promote improved safety in movement.  Practiced 10-12x in clinic with cues consistently necessary.    Physical Performance Testing: Berg balance testing with additional time for rest, education of techniques for testing. And performance.      TREATMENT        DATE:  12/29/2023 Therex: HEP instruction and performance with cues for techniques.  Handout provided. Trial set of each performed with time for question and answer. Extra time required for instruction.  Nustep Lvl 5 8 mins UE/LE for Rom, aerobic exercise.     TherActivity Pivot transfer education with verbal cues , tactile cues .  Performed with SBA with FWW.  Performed to Lt and Rt 2 x each way in clinic visit today Sit to stand education on hips forward to correct posterior weight displacement in movement.  Performed x 5 in clinic today.    Gait Training Ambulation with CGA with FWW with constant cues verbally for foot placement, performed < 50 ft several times.      PATIENT EDUCATION:  Education details: Exam findings, POC, initial HEP, importance of weightbearing Person educated: Patient  Education method: Explanation, Demonstration, Verbal cues, and Handouts Education comprehension: verbalized understanding, returned demonstration, and needs further education  HOME EXERCISE PROGRAM: Access Code: [email or web address] URL: [email or web address] Date: 01/21/2024 Prepared by: Ozell Silvan  Exercises - Supine Bridge with Resistance Band  - 1 x daily - 7 x weekly - 1 sets - 10 reps - Seated Long Arc Quad  - 1-2 x daily - 7 x weekly - 1-2 sets - 10 reps - 2 hold - Seated March  - 1-2 x daily - 7 x weekly - 1-2 sets - 10 reps - Seated Heel Toe Raises  - 1-2 x daily - 7 x weekly - 1-2 sets - 10 reps - Seated Hip Adduction Isometrics with Ball  - 1-2 x daily - 7 x weekly - 1-2 sets - 10 reps - 5 hold - Seated Hip Abduction with Resistance  - 1-2 x daily - 7 x weekly - 1-2 sets - 10-15 reps - Modified Thomas Stretch  - 2-3 x daily - 7 x weekly - 1 sets - 3-5 reps - 30 hold - Seated Gastroc Stretch with Strap  - 2-3 x daily - 7 x weekly - 1 sets - 3-5 reps - 30 hold  ASSESSMENT:  CLINICAL IMPRESSION: Spent time in clinic today with transfers and short ambulation practice as well as focus on improving posture through hip flexor stretching , gastroc stretching (added to HEP).  Hoping we can make improvements in flexibility to  improve posture and weight distrubution in standing.    OBJECTIVE IMPAIRMENTS: Abnormal gait, decreased activity tolerance, decreased balance, decreased coordination, decreased endurance, decreased mobility, difficulty walking, decreased ROM, decreased strength, hypomobility, increased fascial restrictions, impaired flexibility, improper body mechanics, postural dysfunction, and pain.   ACTIVITY  LIMITATIONS: carrying, lifting, bending, standing, squatting, stairs, transfers, bed mobility, bathing, toileting, dressing, hygiene/grooming, and locomotion level  PARTICIPATION LIMITATIONS: cleaning, interpersonal relationship, and community activity  PERSONAL FACTORS: Age, Fitness, Past/current experiences, Time since onset of injury/illness/exacerbation, and 1 comorbidity: R THA are also affecting patient's functional outcome.   REHAB POTENTIAL: Good  CLINICAL DECISION MAKING: Evolving/moderate complexity  EVALUATION COMPLEXITY: Moderate   GOALS: Goals reviewed with patient? Yes  SHORT TERM GOALS: Target date: 3 weeks : 01/19/2024    Pt will be ind with initial HEP Baseline: Goal status: not met - cues still needed   LONG TERM GOALS: Target date: 10 weeks:  03/08/2024   Pt will be ind with management and progression of HEP Baseline:  Goal status:   New  2.  Pt will improve TUG < 30 seconds with FWW  to show improved gait speed, reduced fall risk.  Baseline:  Goal status:  New  3.  Pt will be able to amb at least 150' with RW modified independent for safe home amb Baseline:  Goal status: New  4.  Pt will have improved PSFS average score to > or = 5/10 on average to show reduced disability due to condition.  Baseline:  Goal status:  New  5.  Patient will demonstrate bilateral hip strength flexion 5/5, hip abduction 4/5 , knee extension and flexion 5/5 to facilitate progressive mobility improvements.  Baseline:  Goal status:  New    PLAN:  PT FREQUENCY: 2x/week  PT DURATION: 10 weeks  PLANNED INTERVENTIONS: 97164- PT Re-evaluation, 97750- Physical Performance Testing, 97110-Therapeutic exercises, 97530- Therapeutic activity, [id number]- Neuromuscular re-education, 97535- Self Care, 02859- Manual therapy, [phone]- Gait training, [phone]- Aquatic Therapy, [phone]- Electrical stimulation (unattended), 97016- Vasopneumatic device, [id number]- Ultrasound, [id number]- Ionotophoresis  4mg /ml Dexamethasone , 79439 (1-2 muscles), 20561 (3+ muscles)- Dry Needling, Patient/Family education, Balance training, Stair training, Taping, Joint mobilization, Cryotherapy, and Moist heat  PLAN FOR NEXT SESSION:  work on gastroc and hip flexor tightness, postural awareness control.   Ozell Silvan, PT, DPT, OCS, ATC 01/21/24  12:33 PM

## 2024-01-21 NOTE — Telephone Encounter (Signed)
 Mail box is full at this time 10:11am 01/21/2024

## 2024-01-21 NOTE — Telephone Encounter (Signed)
 No answer at 11:05am 10/23/205

## 2024-01-21 NOTE — Progress Notes (Signed)
 Referral placed to psychotherapy.

## 2024-01-26 ENCOUNTER — Encounter: Payer: Self-pay | Admitting: Rehabilitative and Restorative Service Providers"

## 2024-01-26 ENCOUNTER — Ambulatory Visit (INDEPENDENT_AMBULATORY_CARE_PROVIDER_SITE_OTHER): Admitting: Rehabilitative and Restorative Service Providers"

## 2024-01-26 DIAGNOSIS — M5459 Other low back pain: Secondary | ICD-10-CM

## 2024-01-26 DIAGNOSIS — R262 Difficulty in walking, not elsewhere classified: Secondary | ICD-10-CM

## 2024-01-26 DIAGNOSIS — M25552 Pain in left hip: Secondary | ICD-10-CM

## 2024-01-26 DIAGNOSIS — R296 Repeated falls: Secondary | ICD-10-CM

## 2024-01-26 DIAGNOSIS — M6281 Muscle weakness (generalized): Secondary | ICD-10-CM

## 2024-01-26 DIAGNOSIS — M25551 Pain in right hip: Secondary | ICD-10-CM | POA: Diagnosis not present

## 2024-01-26 NOTE — Therapy (Signed)
 OUTPATIENT PHYSICAL THERAPY TREATMENT   Patient Name: Lee Nelson MRN: [id number] DOB:[birth year], 83 y.o., male Today's Date: 01/26/2024  END OF SESSION:  PT End of Session - 01/26/24 1214     Visit Number 4    Number of Visits 20    Date for Recertification  03/08/24    Authorization Type Medicare    Progress Note Due on Visit 10    PT Start Time 1156    PT Stop Time 1235    PT Time Calculation (min) 39 min    Activity Tolerance Patient tolerated treatment well    Behavior During Therapy [facility] for tasks assessed/performed                   Past Medical History:  Diagnosis Date   Anxiety    Arthritis    Bladder calculus    BPH (benign prostatic hyperplasia)    Chronic constipation    Complication of anesthesia     I had some coughing afterwards for a couple of days--  per pt perfers spinal anesthesia since general anesthesia congnitive issues when older   Diverticulosis of colon    Dry eye syndrome of both eyes    Environmental allergies    GERD (gastroesophageal reflux disease)    occasional   History of adenomatous polyp of colon    08/ 2004   History of kidney stones    History of squamous cell carcinoma in situ (SCCIS) of skin    s/p  excision 2013 facial areas and 06/ 2016 nose   Migraine    eye migraine occasional   Seasonal and perennial allergic rhinitis    Thrombocytopenia    Tingling    hands and feet bilat , intermittantly-- per pt has lumbar bulging disk   Urinary frequency    Vocal fold atrophy    dysphonia-- per pt has to drink large amount of water  to take even on pill   Wears glasses    Past Surgical History:  Procedure Laterality Date   APPENDECTOMY  child   CARDIOVASCULAR STRESS TEST  05-17-2015  dr hilty   Low risk nuclear study w/ no ischemia/  normal LV function and wall motion , stress ef 54% (lvef 45-54%)   CHOLECYSTECTOMY N/A 09/29/2014   Procedure: LAPAROSCOPIC CHOLECYSTECTOMY WITH INTRAOPERATIVE CHOLANGIOGRAM;   Surgeon: Alm Angle, MD;  Location: [facility];  Service: General;  Laterality: N/A;   COLONOSCOPY  last one 09-06-2010   ESOPHAGOGASTRODUODENOSCOPY N/A 12/09/2013   Procedure: ESOPHAGOGASTRODUODENOSCOPY (EGD);  Surgeon: Elsie Cree, MD;  Location[facility];  Service: Endoscopy;  Laterality: N/A;   EXTRACORPOREAL SHOCK WAVE LITHOTRIPSY  yrs ago   INGUINAL HERNIA REPAIR Left child   inguinal hernia repair  09/2017   NISSEN FUNDOPLICATION  1980's   open   STONE EXTRACTION WITH BASKET N/A 08/18/2016   Procedure: STONE EXTRACTION WITH BASKET;  Surgeon: Chales Idol, MD;  Location: [facility];  Service: Urology;  Laterality: N/A;   THULIUM LASER TURP (TRANSURETHRAL RESECTION OF PROSTATE) N/A 08/18/2016   Procedure: THULIUM LASER BLADDER NECK INCISION AND BLADDER STONE REMOVAL;  Surgeon: Chales Idol, MD;  Location: [facility];  Service: Urology;  Laterality: N/A;   TONSILLECTOMY  child   TOTAL HIP ARTHROPLASTY Right 09/07/2022   Procedure: TOTAL HIP ARTHROPLASTY ANTERIOR APPROACH;  Surgeon: Vernetta Lonni GRADE, MD;  Location: [facility];  Service: Orthopedics;  Laterality: Right;   TRANSTHORACIC ECHOCARDIOGRAM  11/18/2010   grade 1 diastolic dysfunction, ef 55-60%/  trivial MR and TR/ mild dilated RA   Patient Active Problem List   Diagnosis Date Noted   Status post total replacement of right hip 09/22/2022   Protein-calorie malnutrition, severe 09/08/2022   Hip fracture (HCC) 09/07/2022   BPH (benign prostatic hyperplasia) 09/07/2022   Closed fracture of right femur, unspecified fracture morphology, initial encounter (HCC) 09/07/2022   Fall 09/07/2022   Closed subcapital fracture of neck of femur, right, initial encounter (HCC) 09/07/2022   Memory impairment 08/06/2022   Pneumonia due to COVID-19 virus 05/06/2022   Severe sepsis (HCC) 05/06/2022   Acute hypoxemic respiratory failure (HCC) 05/06/2022   NPH (normal pressure hydrocephalus) (HCC)  05/06/2022   Physical deconditioning 05/06/2022   Thrombocytopenia 05/06/2022   Dementia (HCC) 05/06/2022   SBO (small bowel obstruction) (HCC) 12/31/2019   Amaurosis fugax of right eye 08/25/2017   PSVT (paroxysmal supraventricular tachycardia) 09/12/2015   Chest pain 05/03/2015   Dyspnea 05/03/2015   ALLERGIC RHINITIS 09/21/2007   G E R D 09/21/2007   SMOKE INHALATION 09/21/2007   OSTEOPOROSIS 01/28/2007   PALPITATIONS 01/28/2007   COUGH 01/28/2007   ALLERGY 01/28/2007    PCP: Janey Santos, MD  REFERRING PROVIDER: Addie Cordella Hamilton, MD   REFERRING DIAG:  W19.XXXD (ICD-10-CM) - Fall, subsequent encounter  M79.671 (ICD-10-CM) - Pain in right foot  M25.561 (ICD-10-CM) - Right knee pain, unspecified chronicity  M54.50 (ICD-10-CM) - Low back pain, unspecified back pain laterality, unspecified chronicity, unspecified whether sciatica present  M25.551 (ICD-10-CM) - Pain in right hip    THERAPY DIAG:  Other low back pain  Pain in left hip  Pain in right hip  Muscle weakness (generalized)  Difficulty in walking, not elsewhere classified  Repeated falls  Rationale for Evaluation and Treatment: Rehabilitation  ONSET DATE: Acute on chronic.  Fall reported 11/01/2023  SUBJECTIVE:   SUBJECTIVE STATEMENT: Pt indicated no specific areas of pain increase upon arrival today.   PERTINENT HISTORY:  Rt THA June 2024, Hx of dementia, anxiety, OA, osteoporosis, BPH, skin cancer, PSVT, chronic thrombocytopenia, shingles,   PAIN:  PRS scale: no specific pain.  Pain location: posterior Rt hip, back.   Pain description: aching at rest but can be sharp  Aggravating factors: Sometimes getting out of bed, sitting to standing Relieving factors: Heat, pain medication   PRECAUTIONS:  None current  RED FLAGS: None   WEIGHT BEARING RESTRICTIONS: WBAT  FALLS:  Has patient fallen in last 6 months? Yes. Number of falls multiple fall, one between last visit and today's eval.    Has a fear of falling.   LIVING ENVIRONMENT: Lives with: lives with their spouse Lives in: House/apartment Stairs: Ramped and level Has following equipment at home: Environmental Consultant - 2 wheeled, Wheelchair (manual), and urinal and BSC, grab bars in bathroom but currently not using shower  OCCUPATION: Retired  PLOF: Independent with household mobility with device and Needs assistance with ADLs  PATIENT GOALS: Return to walking and get in/out of chair with less pain, stand  NEXT MD VISIT: n/a  OBJECTIVE:  Note: Objective measures were completed at Evaluation unless otherwise noted.  DIAGNOSTIC FINDINGS: 1. Minimally displaced fracture through the base of the greater trochanter. No intertrochanteric extension is identified but fractures can be occult on CT, particularly in the elderly. If there is concern for further fracture, MRI without contrast is the best test for evaluation. 2. Mild left hip osteoarthritis. 3. Large stool ball in the rectosigmoid colon.  PATIENT SURVEYS:  The Patient-Specific Functional Scale  Eval: 12/29/2023   Standing independent 0   2.   Walking independent with FWW 0   3.   Transfers independent 0   4.   Stairs independent 0   AVERAGE       COGNITION: 12/29/2023 Overall cognitive status: Within functional limits for tasks assessed     SENSATION: 12/29/2023 [facility]   MUSCLE LENGTH: 12/29/2023 No specific testing  POSTURE: 12/29/2023 Forward head, rounded shoulders, decreased lumbar lordosis, increased thoracic kyphosis.  Posterior weight displacement in standing posture with flexed trunk.   PALPATION: 12/29/2023 Did not assess  LOWER EXTREMITY ROM:   ROM Right Eval 12/29/2023 Left Eval 12/29/2023  Hip flexion    Hip extension    Hip abduction    Hip adduction    Hip internal rotation    Hip external rotation    Knee flexion    Knee extension    Ankle dorsiflexion    Ankle plantarflexion    Ankle inversion    Ankle eversion     (Blank  rows = not tested)  LOWER EXTREMITY MMT:  MMT Right Eval 12/29/2023 Left Eval 12/29/2023  Hip flexion 4+/5 4+/5  Hip extension    Hip abduction Seated isometric (weak, mild pain) Seated isometric  (Weak, mild pain)  Hip adduction    Hip internal rotation    Hip external rotation    Knee flexion 4/5 4/5  Knee extension 4+/5 4+/5  Ankle dorsiflexion 4 4  Ankle plantarflexion  4-  Ankle inversion    Ankle eversion     (Blank rows = not tested, * = pain)   FUNCTIONAL TESTS:  01/19/2024:    01/19/24 0001  Balance  Balance Assessed Yes  Standardized Balance Assessment  Standardized Balance Assessment Berg Balance Test  Berg Balance Test  Sit to Stand 2  Standing Unsupported 1  Sitting with Back Unsupported but Feet Supported on Floor or Stool 4  Stand to Sit 1  Transfers 2  Standing Unsupported with Eyes Closed 0  Standing Unsupported with Feet Together 0  From Standing, Reach Forward with Outstretched Arm 1  From Standing Position, Pick up Object from Floor 0  From Standing Position, Turn to Look Behind Over each Shoulder 1  Turn 360 Degrees 0  Standing Unsupported, Alternately Place Feet on Step/Stool 1  Standing Unsupported, One Foot in Front 0  Standing on One Leg 0  Total Score 13      12/29/2023 TUG with FWW and CGA:  1 min 45 seconds 18 inch chair transfer: unable without UE assist.   GAIT: 12/29/2023 Eval: Arrived in wheelchair.  Able to perform FWW ambulation with SBA to CGA at times.  Reduce step lengths bilateral, variab                    TREATMENT        DATE:  01/26/2024  Neuro Re-ed (postural awareness, balance improvements) Postural weight shifts fwd/back on fitter board with single hand assist with CGA to min A at times to prevent LOB posterioly x 15.  Performed 30 sec holds in anterior displacement x 4 Standing hip thrusts forward with feet together to improve upright posture/lumbar mobility and anterior weight displacement.  Bilateral hands  on bars with SBA.   Offset modified tandem positioning with anterior weight shift x 15 bilateral with cues for techniques, SBA  : bilateral hands on rails.  Seated red band bilateral forward reach with trunk flexion to initiate forward trunk lean and weight distribution  x 10.  Attempted in standing with poor control (loss of balance posterior many times with mod A to correct) x 10 attempts. Standing weight displacement control with mild posterior force on gait belt to initate corrections anteriorly from patient.  Fair performance.  Variable pressure applied.  Performed 30 sec bouts x 3   Gait Training Ambulation household distances < 100 ft x 2 with cues for foot placement, CGA for safety.   Continued encouragement for larger step length and initiation of heel to toe progression.    TREATMENT        DATE:  01/21/2024 Therex: Supine thomas hip flexor stretch with contralateral foot on table 30 sec x 3 bilaterally (time for cues) Sitting gastroc stretch 30 sec x 3 bilateral (time for cues)  Gait Training Ambulation household distances < 100 ft x 2 with cues for foot placement, CGA for safety.    TherActivity Wheelchair to chair to wheelchair side transfers both directions x 2  with cues for techniques, hand placement and SBA for safety.  Consistent cues required throughout movement.  Supine to sit to supine transfers with log rolling technique focus.  Verbal and visual and tactile cues required throughout movement.  Slow movement pattern due to confusion in activity despite cues.  Supine bridge x 15 for hip mobility and bed mobility improvements.     TREATMENT        DATE:  01/19/2024  Neuro Re-ed (balance control, weight shift improvement, postural awareness/correction  BIG inspired stepping forward stepping x 12 bilateral for weight shift improvements.  In // bars with CGA to min A with constant verbal cues.   BIG inspired stepping forward stepping x 12 bilateral for weight shift  improvements.  In // bars with CGA to min A with constant verbal cues.  Elevated bar stool height sit to stand no UE assist with MIN A mostly from clinician x 10.  Encouragement for anterior weight displacement.   TherActivity Cues verbally and visually for sit to stand to sit from wheelchair with hand placement on chair handrails rather than use of walker or // bars to promote improved safety in movement.  Practiced 10-12x in clinic with cues consistently necessary.    Physical Performance Testing: Berg balance testing with additional time for rest, education of techniques for testing. And performance.      TREATMENT        DATE:  12/29/2023 Therex: HEP instruction and performance with cues for techniques.  Handout provided. Trial set of each performed with time for question and answer. Extra time required for instruction.  Nustep Lvl 5 8 mins UE/LE for Rom, aerobic exercise.     TherActivity Pivot transfer education with verbal cues , tactile cues .  Performed with SBA with FWW.  Performed to Lt and Rt 2 x each way in clinic visit today Sit to stand education on hips forward to correct posterior weight displacement in movement.  Performed x 5 in clinic today.   Gait Training Ambulation with CGA with FWW with constant cues verbally for foot placement, performed < 50 ft several times.      PATIENT EDUCATION:  Education details: Exam findings, POC, initial HEP, importance of weightbearing Person educated: Patient  Education method: Explanation, Demonstration, Verbal cues, and Handouts Education comprehension: verbalized understanding, returned demonstration, and needs further education  HOME EXERCISE PROGRAM: Access Code: [email or web address] URL: [email or web address] Date: 01/21/2024 Prepared by: Ozell Silvan  Exercises - Supine Bridge with Resistance Band  - 1 x daily -  7 x weekly - 1 sets - 10 reps - Seated Long Arc Quad  - 1-2 x daily - 7 x weekly - 1-2 sets - 10  reps - 2 hold - Seated March  - 1-2 x daily - 7 x weekly - 1-2 sets - 10 reps - Seated Heel Toe Raises  - 1-2 x daily - 7 x weekly - 1-2 sets - 10 reps - Seated Hip Adduction Isometrics with Ball  - 1-2 x daily - 7 x weekly - 1-2 sets - 10 reps - 5 hold - Seated Hip Abduction with Resistance  - 1-2 x daily - 7 x weekly - 1-2 sets - 10-15 reps - Modified Thomas Stretch  - 2-3 x daily - 7 x weekly - 1 sets - 3-5 reps - 30 hold - Seated Gastroc Stretch with Strap  - 2-3 x daily - 7 x weekly - 1 sets - 3-5 reps - 30 hold  ASSESSMENT:  CLINICAL IMPRESSION: Changing up on intervention style to promote reactive response initiation provided some improvement in anterior displacement but still struggling.  Continued training on transfer techniques, ambulation, and postural awareness to help improve progressive mobility.    OBJECTIVE IMPAIRMENTS: Abnormal gait, decreased activity tolerance, decreased balance, decreased coordination, decreased endurance, decreased mobility, difficulty walking, decreased ROM, decreased strength, hypomobility, increased fascial restrictions, impaired flexibility, improper body mechanics, postural dysfunction, and pain.   ACTIVITY LIMITATIONS: carrying, lifting, bending, standing, squatting, stairs, transfers, bed mobility, bathing, toileting, dressing, hygiene/grooming, and locomotion level  PARTICIPATION LIMITATIONS: cleaning, interpersonal relationship, and community activity  PERSONAL FACTORS: Age, Fitness, Past/current experiences, Time since onset of injury/illness/exacerbation, and 1 comorbidity: R THA are also affecting patient's functional outcome.   REHAB POTENTIAL: Good  CLINICAL DECISION MAKING: Evolving/moderate complexity  EVALUATION COMPLEXITY: Moderate   GOALS: Goals reviewed with patient? Yes  SHORT TERM GOALS: Target date: 3 weeks : 01/19/2024    Pt will be ind with initial HEP Baseline: Goal status: not met - cues still needed   LONG TERM  GOALS: Target date: 10 weeks:  03/08/2024   Pt will be ind with management and progression of HEP Baseline:  Goal status:   on going 01/26/2024  2.  Pt will improve TUG < 30 seconds with FWW  to show improved gait speed, reduced fall risk.  Baseline:  Goal status:  on going 01/26/2024  3.  Pt will be able to amb at least 150' with RW modified independent for safe home amb Baseline:  Goal status: on going 01/26/2024  4.  Pt will have improved PSFS average score to > or = 5/10 on average to show reduced disability due to condition.  Baseline:  Goal status:  on going 01/26/2024  5.  Patient will demonstrate bilateral hip strength flexion 5/5, hip abduction 4/5 , knee extension and flexion 5/5 to facilitate progressive mobility improvements.  Baseline:  Goal status:  on going 01/26/2024    PLAN:  PT FREQUENCY: 2x/week  PT DURATION: 10 weeks  PLANNED INTERVENTIONS: 97164- PT Re-evaluation, 97750- Physical Performance Testing, 97110-Therapeutic exercises, 97530- Therapeutic activity, 97112- Neuromuscular re-education, 97535- Self Care, 02859- Manual therapy, [phone]- Gait training, [phone]- Aquatic Therapy, [phone]- Electrical stimulation (unattended), 97016- Vasopneumatic device, [id number]- Ultrasound, [id number]- Ionotophoresis 4mg /ml Dexamethasone , 79439 (1-2 muscles), 20561 (3+ muscles)- Dry Needling, Patient/Family education, Balance training, Stair training, Taping, Joint mobilization, Cryotherapy, and Moist heat  PLAN FOR NEXT SESSION:  hip extension, lumbar extension, DF gains, postural awareness.   Ozell Silvan, PT, DPT, OCS, ATC 01/26/24  12:48 PM

## 2024-01-29 ENCOUNTER — Ambulatory Visit (INDEPENDENT_AMBULATORY_CARE_PROVIDER_SITE_OTHER): Admitting: Rehabilitative and Restorative Service Providers"

## 2024-01-29 ENCOUNTER — Encounter: Payer: Self-pay | Admitting: Rehabilitative and Restorative Service Providers"

## 2024-01-29 DIAGNOSIS — M5459 Other low back pain: Secondary | ICD-10-CM

## 2024-01-29 DIAGNOSIS — R296 Repeated falls: Secondary | ICD-10-CM

## 2024-01-29 DIAGNOSIS — M6281 Muscle weakness (generalized): Secondary | ICD-10-CM

## 2024-01-29 DIAGNOSIS — M25552 Pain in left hip: Secondary | ICD-10-CM

## 2024-01-29 DIAGNOSIS — M25551 Pain in right hip: Secondary | ICD-10-CM

## 2024-01-29 DIAGNOSIS — R262 Difficulty in walking, not elsewhere classified: Secondary | ICD-10-CM

## 2024-01-29 NOTE — Therapy (Signed)
 OUTPATIENT PHYSICAL THERAPY TREATMENT   Patient Name: Lee Nelson MRN: [id number] DOB:1940-07-31, 83 y.o., male Today's Date: 01/29/2024  END OF SESSION:  PT End of Session - 01/29/24 1107     Visit Number 5    Number of Visits 20    Date for Recertification  03/08/24    Authorization Type Medicare    Progress Note Due on Visit 10    PT Start Time 1103    PT Stop Time 1143    PT Time Calculation (min) 40 min    Activity Tolerance Patient tolerated treatment well    Behavior During Therapy [facility] for tasks assessed/performed                    Past Medical History:  Diagnosis Date   Anxiety    Arthritis    Bladder calculus    BPH (benign prostatic hyperplasia)    Chronic constipation    Complication of anesthesia     I had some coughing afterwards for a couple of days--  per pt perfers spinal anesthesia since general anesthesia congnitive issues when older   Diverticulosis of colon    Dry eye syndrome of both eyes    Environmental allergies    GERD (gastroesophageal reflux disease)    occasional   History of adenomatous polyp of colon    08/ 2004   History of kidney stones    History of squamous cell carcinoma in situ (SCCIS) of skin    s/p  excision 2013 facial areas and 06/ 2016 nose   Migraine    eye migraine occasional   Seasonal and perennial allergic rhinitis    Thrombocytopenia    Tingling    hands and feet bilat , intermittantly-- per pt has lumbar bulging disk   Urinary frequency    Vocal fold atrophy    dysphonia-- per pt has to drink large amount of water  to take even on pill   Wears glasses    Past Surgical History:  Procedure Laterality Date   APPENDECTOMY  child   CARDIOVASCULAR STRESS TEST  05-17-2015  dr hilty   Low risk nuclear study w/ no ischemia/  normal LV function and wall motion , stress ef 54% (lvef 45-54%)   CHOLECYSTECTOMY N/A 09/29/2014   Procedure: LAPAROSCOPIC CHOLECYSTECTOMY WITH INTRAOPERATIVE CHOLANGIOGRAM;   Surgeon: Alm Angle, MD;  Location: [facility];  Service: General;  Laterality: N/A;   COLONOSCOPY  last one 09-06-2010   ESOPHAGOGASTRODUODENOSCOPY N/A 12/09/2013   Procedure: ESOPHAGOGASTRODUODENOSCOPY (EGD);  Surgeon: Elsie Cree, MD;  Location: [facility];  Service: Endoscopy;  Laterality: N/A;   EXTRACORPOREAL SHOCK WAVE LITHOTRIPSY  yrs ago   INGUINAL HERNIA REPAIR Left child   inguinal hernia repair  09/2017   NISSEN FUNDOPLICATION  1980's   open   STONE EXTRACTION WITH BASKET N/A 08/18/2016   Procedure: STONE EXTRACTION WITH BASKET;  Surgeon: Chales Idol, MD;  Location: [facility];  Service: Urology;  Laterality: N/A;   THULIUM LASER TURP (TRANSURETHRAL RESECTION OF PROSTATE) N/A 08/18/2016   Procedure: THULIUM LASER BLADDER NECK INCISION AND BLADDER STONE REMOVAL;  Surgeon: Chales Idol, MD;  Location: [facility];  Service: Urology;  Laterality: N/A;   TONSILLECTOMY  child   TOTAL HIP ARTHROPLASTY Right 09/07/2022   Procedure: TOTAL HIP ARTHROPLASTY ANTERIOR APPROACH;  Surgeon: Vernetta Lonni GRADE, MD;  Location: [facility];  Service: Orthopedics;  Laterality: Right;   TRANSTHORACIC ECHOCARDIOGRAM  11/18/2010   grade 1 diastolic dysfunction, ef  55-60%/  trivial MR and TR/ mild dilated RA   Patient Active Problem List   Diagnosis Date Noted   Status post total replacement of right hip 09/22/2022   Protein-calorie malnutrition, severe 09/08/2022   Hip fracture (HCC) 09/07/2022   BPH (benign prostatic hyperplasia) 09/07/2022   Closed fracture of right femur, unspecified fracture morphology, initial encounter (HCC) 09/07/2022   Fall 09/07/2022   Closed subcapital fracture of neck of femur, right, initial encounter (HCC) 09/07/2022   Memory impairment 08/06/2022   Pneumonia due to COVID-19 virus 05/06/2022   Severe sepsis (HCC) 05/06/2022   Acute hypoxemic respiratory failure (HCC) 05/06/2022   NPH (normal pressure hydrocephalus) (HCC)  05/06/2022   Physical deconditioning 05/06/2022   Thrombocytopenia 05/06/2022   Dementia (HCC) 05/06/2022   SBO (small bowel obstruction) (HCC) 12/31/2019   Amaurosis fugax of right eye 08/25/2017   PSVT (paroxysmal supraventricular tachycardia) 09/12/2015   Chest pain 05/03/2015   Dyspnea 05/03/2015   ALLERGIC RHINITIS 09/21/2007   G E R D 09/21/2007   SMOKE INHALATION 09/21/2007   OSTEOPOROSIS 01/28/2007   PALPITATIONS 01/28/2007   COUGH 01/28/2007   ALLERGY 01/28/2007    PCP: Janey Santos, MD  REFERRING PROVIDER: Addie Cordella Hamilton, MD   REFERRING DIAG:  W19.XXXD (ICD-10-CM) - Fall, subsequent encounter  M79.671 (ICD-10-CM) - Pain in right foot  M25.561 (ICD-10-CM) - Right knee pain, unspecified chronicity  M54.50 (ICD-10-CM) - Low back pain, unspecified back pain laterality, unspecified chronicity, unspecified whether sciatica present  M25.551 (ICD-10-CM) - Pain in right hip    THERAPY DIAG:  Other low back pain  Pain in left hip  Pain in right hip  Muscle weakness (generalized)  Difficulty in walking, not elsewhere classified  Repeated falls  Rationale for Evaluation and Treatment: Rehabilitation  ONSET DATE: Acute on chronic.  Fall reported 11/01/2023  SUBJECTIVE:   SUBJECTIVE STATEMENT: Pt indicated a little sore on back and Rt hip but nothing particularly painful.  Pt indicated he did the homework with exercises.   PERTINENT HISTORY:  Rt THA June 2024, Hx of dementia, anxiety, OA, osteoporosis, BPH, skin cancer, PSVT, chronic thrombocytopenia, shingles,   PAIN:  PRS scale: no specific pain.  Pain location: posterior Rt hip, back.   Pain description: aching at rest but can be sharp  Aggravating factors: Sometimes getting out of bed, sitting to standing Relieving factors: Heat, pain medication   PRECAUTIONS:  None current  RED FLAGS: None   WEIGHT BEARING RESTRICTIONS: WBAT  FALLS:  Has patient fallen in last 6 months? Yes. Number of  falls multiple fall, one between last visit and today's eval.   Has a fear of falling.   LIVING ENVIRONMENT: Lives with: lives with their spouse Lives in: House/apartment Stairs: Ramped and level Has following equipment at home: Environmental Consultant - 2 wheeled, Wheelchair (manual), and urinal and BSC, grab bars in bathroom but currently not using shower  OCCUPATION: Retired  PLOF: Independent with household mobility with device and Needs assistance with ADLs  PATIENT GOALS: Return to walking and get in/out of chair with less pain, stand  NEXT MD VISIT: n/a  OBJECTIVE:  Note: Objective measures were completed at Evaluation unless otherwise noted.  DIAGNOSTIC FINDINGS: 1. Minimally displaced fracture through the base of the greater trochanter. No intertrochanteric extension is identified but fractures can be occult on CT, particularly in the elderly. If there is concern for further fracture, MRI without contrast is the best test for evaluation. 2. Mild left hip osteoarthritis. 3. Large stool ball  in the rectosigmoid colon.  PATIENT SURVEYS:  The Patient-Specific Functional Scale   Eval: 12/29/2023   Standing independent 0   2.   Walking independent with FWW 0   3.   Transfers independent 0   4.   Stairs independent 0   AVERAGE       COGNITION: 12/29/2023 Overall cognitive status: Within functional limits for tasks assessed     SENSATION: 12/29/2023 [facility]   MUSCLE LENGTH: 12/29/2023 No specific testing  POSTURE: 12/29/2023 Forward head, rounded shoulders, decreased lumbar lordosis, increased thoracic kyphosis.  Posterior weight displacement in standing posture with flexed trunk.   PALPATION: 12/29/2023 Did not assess  LOWER EXTREMITY ROM:   ROM Right Eval 12/29/2023 Left Eval 12/29/2023  Hip flexion    Hip extension    Hip abduction    Hip adduction    Hip internal rotation    Hip external rotation    Knee flexion    Knee extension    Ankle dorsiflexion    Ankle  plantarflexion    Ankle inversion    Ankle eversion     (Blank rows = not tested)  LOWER EXTREMITY MMT:  MMT Right Eval 12/29/2023 Left Eval 12/29/2023  Hip flexion 4+/5 4+/5  Hip extension    Hip abduction Seated isometric (weak, mild pain) Seated isometric  (Weak, mild pain)  Hip adduction    Hip internal rotation    Hip external rotation    Knee flexion 4/5 4/5  Knee extension 4+/5 4+/5  Ankle dorsiflexion 4 4  Ankle plantarflexion  4-  Ankle inversion    Ankle eversion     (Blank rows = not tested, * = pain)   FUNCTIONAL TESTS:  01/19/2024:    01/19/24 0001  Balance  Balance Assessed Yes  Standardized Balance Assessment  Standardized Balance Assessment Berg Balance Test  Berg Balance Test  Sit to Stand 2  Standing Unsupported 1  Sitting with Back Unsupported but Feet Supported on Floor or Stool 4  Stand to Sit 1  Transfers 2  Standing Unsupported with Eyes Closed 0  Standing Unsupported with Feet Together 0  From Standing, Reach Forward with Outstretched Arm 1  From Standing Position, Pick up Object from Floor 0  From Standing Position, Turn to Look Behind Over each Shoulder 1  Turn 360 Degrees 0  Standing Unsupported, Alternately Place Feet on Step/Stool 1  Standing Unsupported, One Foot in Front 0  Standing on One Leg 0  Total Score 13    12/29/2023 TUG with FWW and CGA:  1 min 45 seconds 18 inch chair transfer: unable without UE assist.   GAIT: 12/29/2023 Eval: Arrived in wheelchair.  Able to perform FWW ambulation with SBA to CGA at times.  Reduce step lengths bilateral, variab                    TREATMENT        DATE:  01/29/2024 Neuro Re-ed (postural awareness, balance improvements) Seated bilateral shoulder flexion reach with trunk flexion x 10 to improve forward flexion mobility/control.  Standing feet together balance with SBA with verbal cues for weight placmeent forward 1 min Standing hip thrust anteriorly to improve lumbar/hip mobiltiy  and awareness of corrections to posterior hip displacements of balance.   TherEx Seated marching x 10 bilateral Seated LAQ x 10 bilateral Seated blue band hip abduction x 30 bilaterally    TherActivity (to improve transfers, ambulation, progressive mobility) Standing pivot transfer with SBA, cues for hand  placement and foot sequencing to improve stability in movement.  Performed in clinc x 4 Nustep Lvl 5 6 mins UE/LE for stepping replication, mobility, push/pull gains.   Pt required trip to bathroom in middle of session.  Clinician provided SBA with chair to toilet transfer with cues on hand placement and foot placement.  CGA provided in self care wipe and don/doff pants, etc.  SBA to occasional min A required for standing at sink during hand washing to prevent LOB posteriorly during attempts at unassisted standing.     Gait Training Ambulation with FWW 40 ft with CGA with constant cues for step length improvements and foot placement.     TREATMENT        DATE:  01/26/2024  Neuro Re-ed (postural awareness, balance improvements) Postural weight shifts fwd/back on fitter board with single hand assist with CGA to min A at times to prevent LOB posterioly x 15.  Performed 30 sec holds in anterior displacement x 4 Standing hip thrusts forward with feet together to improve upright posture/lumbar mobility and anterior weight displacement.  Bilateral hands on bars with SBA.   Offset modified tandem positioning with anterior weight shift x 15 bilateral with cues for techniques, SBA  : bilateral hands on rails.  Seated red band bilateral forward reach with trunk flexion to initiate forward trunk lean and weight distribution x 10.  Attempted in standing with poor control (loss of balance posterior many times with mod A to correct) x 10 attempts. Standing weight displacement control with mild posterior force on gait belt to initate corrections anteriorly from patient.  Fair performance.  Variable  pressure applied.  Performed 30 sec bouts x 3   Gait Training Ambulation household distances < 100 ft x 2 with cues for foot placement, CGA for safety.   Continued encouragement for larger step length and initiation of heel to toe progression.    TREATMENT        DATE:  01/21/2024 Therex: Supine thomas hip flexor stretch with contralateral foot on table 30 sec x 3 bilaterally (time for cues) Sitting gastroc stretch 30 sec x 3 bilateral (time for cues)  Gait Training Ambulation household distances < 100 ft x 2 with cues for foot placement, CGA for safety.    TherActivity Wheelchair to chair to wheelchair side transfers both directions x 2  with cues for techniques, hand placement and SBA for safety.  Consistent cues required throughout movement.  Supine to sit to supine transfers with log rolling technique focus.  Verbal and visual and tactile cues required throughout movement.  Slow movement pattern due to confusion in activity despite cues.  Supine bridge x 15 for hip mobility and bed mobility improvements.     TREATMENT        DATE:  01/19/2024  Neuro Re-ed (balance control, weight shift improvement, postural awareness/correction  BIG inspired stepping forward stepping x 12 bilateral for weight shift improvements.  In // bars with CGA to min A with constant verbal cues.   BIG inspired stepping forward stepping x 12 bilateral for weight shift improvements.  In // bars with CGA to min A with constant verbal cues.  Elevated bar stool height sit to stand no UE assist with MIN A mostly from clinician x 10.  Encouragement for anterior weight displacement.   TherActivity Cues verbally and visually for sit to stand to sit from wheelchair with hand placement on chair handrails rather than use of walker or // bars to promote improved safety in  movement.  Practiced 10-12x in clinic with cues consistently necessary.    Physical Performance Testing: Berg balance testing with additional time  for rest, education of techniques for testing. And performance.     PATIENT EDUCATION:  Education details: Exam findings, POC, initial HEP, importance of weightbearing Person educated: Patient  Education method: Explanation, Demonstration, Verbal cues, and Handouts Education comprehension: verbalized understanding, returned demonstration, and needs further education  HOME EXERCISE PROGRAM: Access Code: [email or web address] URL: [email or web address] Date: 01/21/2024 Prepared by: Ozell Silvan  Exercises - Supine Bridge with Resistance Band  - 1 x daily - 7 x weekly - 1 sets - 10 reps - Seated Long Arc Quad  - 1-2 x daily - 7 x weekly - 1-2 sets - 10 reps - 2 hold - Seated March  - 1-2 x daily - 7 x weekly - 1-2 sets - 10 reps - Seated Heel Toe Raises  - 1-2 x daily - 7 x weekly - 1-2 sets - 10 reps - Seated Hip Adduction Isometrics with Ball  - 1-2 x daily - 7 x weekly - 1-2 sets - 10 reps - 5 hold - Seated Hip Abduction with Resistance  - 1-2 x daily - 7 x weekly - 1-2 sets - 10-15 reps - Modified Thomas Stretch  - 2-3 x daily - 7 x weekly - 1 sets - 3-5 reps - 30 hold - Seated Gastroc Stretch with Strap  - 2-3 x daily - 7 x weekly - 1 sets - 3-5 reps - 30 hold  ASSESSMENT:  CLINICAL IMPRESSION: Assistance required in bathroom related tasks for transfers and positioning as noted above.  Able to use time for bathroom trip for education and training on sequencing and positioning to help improve towards independence.  Due to tasks, walking and other activity limited.    OBJECTIVE IMPAIRMENTS: Abnormal gait, decreased activity tolerance, decreased balance, decreased coordination, decreased endurance, decreased mobility, difficulty walking, decreased ROM, decreased strength, hypomobility, increased fascial restrictions, impaired flexibility, improper body mechanics, postural dysfunction, and pain.   ACTIVITY LIMITATIONS: carrying, lifting, bending, standing, squatting, stairs,  transfers, bed mobility, bathing, toileting, dressing, hygiene/grooming, and locomotion level  PARTICIPATION LIMITATIONS: cleaning, interpersonal relationship, and community activity  PERSONAL FACTORS: Age, Fitness, Past/current experiences, Time since onset of injury/illness/exacerbation, and 1 comorbidity: R THA are also affecting patient's functional outcome.   REHAB POTENTIAL: Good  CLINICAL DECISION MAKING: Evolving/moderate complexity  EVALUATION COMPLEXITY: Moderate   GOALS: Goals reviewed with patient? Yes  SHORT TERM GOALS: Target date: 3 weeks : 01/19/2024    Pt will be ind with initial HEP Baseline: Goal status: not met - cues still needed   LONG TERM GOALS: Target date: 10 weeks:  03/08/2024   Pt will be ind with management and progression of HEP Baseline:  Goal status:   on going 01/26/2024  2.  Pt will improve TUG < 30 seconds with FWW  to show improved gait speed, reduced fall risk.  Baseline:  Goal status:  on going 01/26/2024  3.  Pt will be able to amb at least 150' with RW modified independent for safe home amb Baseline:  Goal status: on going 01/26/2024  4.  Pt will have improved PSFS average score to > or = 5/10 on average to show reduced disability due to condition.  Baseline:  Goal status:  on going 01/26/2024  5.  Patient will demonstrate bilateral hip strength flexion 5/5, hip abduction 4/5 , knee extension and flexion 5/5 to facilitate progressive mobility improvements.  Baseline:  Goal status:  on going 01/26/2024    PLAN:  PT FREQUENCY: 2x/week  PT DURATION: 10 weeks  PLANNED INTERVENTIONS: 97164- PT Re-evaluation, 97750- Physical Performance Testing, 97110-Therapeutic exercises, 97530- Therapeutic activity, 97112- Neuromuscular re-education, 97535- Self Care, 02859- Manual therapy, [id number]- Gait training, [phone]- Aquatic Therapy, [phone]- Electrical stimulation (unattended), 97016- Vasopneumatic device, [id number]- Ultrasound, [id number]-  Ionotophoresis 4mg /ml Dexamethasone , 79439 (1-2 muscles), 20561 (3+ muscles)- Dry Needling, Patient/Family education, Balance training, Stair training, Taping, Joint mobilization, Cryotherapy, and Moist heat  PLAN FOR NEXT SESSION:  Postural awareness, mobility gains. Balance challenges.  Ozell Silvan, PT, DPT, OCS, ATC 01/29/24  11:54 AM

## 2024-02-01 ENCOUNTER — Encounter: Payer: Self-pay | Admitting: Radiology

## 2024-02-02 ENCOUNTER — Encounter: Admitting: Rehabilitative and Restorative Service Providers"

## 2024-02-02 NOTE — Therapy (Incomplete)
 OUTPATIENT PHYSICAL THERAPY TREATMENT   Patient Name: Lee Nelson MRN: [id number] DOB:[birth year], 83 y.o., male Today's Date: 02/02/2024  END OF SESSION:              Past Medical History:  Diagnosis Date   Anxiety    Arthritis    Bladder calculus    BPH (benign prostatic hyperplasia)    Chronic constipation    Complication of anesthesia     I had some coughing afterwards for a couple of days--  per pt perfers spinal anesthesia since general anesthesia congnitive issues when older   Diverticulosis of colon    Dry eye syndrome of both eyes    Environmental allergies    GERD (gastroesophageal reflux disease)    occasional   History of adenomatous polyp of colon    08/ 2004   History of kidney stones    History of squamous cell carcinoma in situ (SCCIS) of skin    s/p  excision 2013 facial areas and 06/ 2016 nose   Migraine    eye migraine occasional   Seasonal and perennial allergic rhinitis    Thrombocytopenia    Tingling    hands and feet bilat , intermittantly-- per pt has lumbar bulging disk   Urinary frequency    Vocal fold atrophy    dysphonia-- per pt has to drink large amount of water  to take even on pill   Wears glasses    Past Surgical History:  Procedure Laterality Date   APPENDECTOMY  child   CARDIOVASCULAR STRESS TEST  05-17-2015  dr hilty   Low risk nuclear study w/ no ischemia/  normal LV function and wall motion , stress ef 54% (lvef 45-54%)   CHOLECYSTECTOMY N/A 09/29/2014   Procedure: LAPAROSCOPIC CHOLECYSTECTOMY WITH INTRAOPERATIVE CHOLANGIOGRAM;  Surgeon: Alm Angle, MD;  Location: [facility];  Service: General;  Laterality: N/A;   COLONOSCOPY  last one 09-06-2010   ESOPHAGOGASTRODUODENOSCOPY N/A 12/09/2013   Procedure: ESOPHAGOGASTRODUODENOSCOPY (EGD);  Surgeon: Elsie Cree, MD;  Location: [facility];  Service: Endoscopy;  Laterality: N/A;   EXTRACORPOREAL SHOCK WAVE LITHOTRIPSY  yrs ago   INGUINAL HERNIA REPAIR Left child    inguinal hernia repair  09/2017   NISSEN FUNDOPLICATION  1980's   open   STONE EXTRACTION WITH BASKET N/A 08/18/2016   Procedure: STONE EXTRACTION WITH BASKET;  Surgeon: Chales Idol, MD;  Location: [facility];  Service: Urology;  Laterality: N/A;   THULIUM LASER TURP (TRANSURETHRAL RESECTION OF PROSTATE) N/A 08/18/2016   Procedure: THULIUM LASER BLADDER NECK INCISION AND BLADDER STONE REMOVAL;  Surgeon: Chales Idol, MD;  Location: [facility];  Service: Urology;  Laterality: N/A;   TONSILLECTOMY  child   TOTAL HIP ARTHROPLASTY Right 09/07/2022   Procedure: TOTAL HIP ARTHROPLASTY ANTERIOR APPROACH;  Surgeon: Vernetta Lonni GRADE, MD;  Location: [facility];  Service: Orthopedics;  Laterality: Right;   TRANSTHORACIC ECHOCARDIOGRAM  11/18/2010   grade 1 diastolic dysfunction, ef 55-60%/  trivial MR and TR/ mild dilated RA   Patient Active Problem List   Diagnosis Date Noted   Status post total replacement of right hip 09/22/2022   Protein-calorie malnutrition, severe 09/08/2022   Hip fracture (HCC) 09/07/2022   BPH (benign prostatic hyperplasia) 09/07/2022   Closed fracture of right femur, unspecified fracture morphology, initial encounter (HCC) 09/07/2022   Fall 09/07/2022   Closed subcapital fracture of neck of femur, right, initial encounter (HCC) 09/07/2022   Memory impairment 08/06/2022   Pneumonia due to COVID-19 virus  05/06/2022   Severe sepsis (HCC) 05/06/2022   Acute hypoxemic respiratory failure (HCC) 05/06/2022   NPH (normal pressure hydrocephalus) (HCC) 05/06/2022   Physical deconditioning 05/06/2022   Thrombocytopenia 05/06/2022   Dementia (HCC) 05/06/2022   SBO (small bowel obstruction) (HCC) 12/31/2019   Amaurosis fugax of right eye 08/25/2017   PSVT (paroxysmal supraventricular tachycardia) 09/12/2015   Chest pain 05/03/2015   Dyspnea 05/03/2015   ALLERGIC RHINITIS 09/21/2007   G E R D 09/21/2007   SMOKE INHALATION 09/21/2007    OSTEOPOROSIS 01/28/2007   PALPITATIONS 01/28/2007   COUGH 01/28/2007   ALLERGY 01/28/2007    PCP: Janey Santos, MD  REFERRING PROVIDER: Addie Cordella Hamilton, MD   REFERRING DIAG:  W19.XXXD (ICD-10-CM) - Fall, subsequent encounter  M79.671 (ICD-10-CM) - Pain in right foot  M25.561 (ICD-10-CM) - Right knee pain, unspecified chronicity  M54.50 (ICD-10-CM) - Low back pain, unspecified back pain laterality, unspecified chronicity, unspecified whether sciatica present  M25.551 (ICD-10-CM) - Pain in right hip    THERAPY DIAG:  No diagnosis found.  Rationale for Evaluation and Treatment: Rehabilitation  ONSET DATE: Acute on chronic.  Fall reported 11/01/2023  SUBJECTIVE:   SUBJECTIVE STATEMENT: Pt indicated a little sore on back and Rt hip but nothing particularly painful.  Pt indicated he did the homework with exercises.   PERTINENT HISTORY:  Rt THA June 2024, Hx of dementia, anxiety, OA, osteoporosis, BPH, skin cancer, PSVT, chronic thrombocytopenia, shingles,   PAIN:  PRS scale: no specific pain.  Pain location: posterior Rt hip, back.   Pain description: aching at rest but can be sharp  Aggravating factors: Sometimes getting out of bed, sitting to standing Relieving factors: Heat, pain medication   PRECAUTIONS:  None current  RED FLAGS: None   WEIGHT BEARING RESTRICTIONS: WBAT  FALLS:  Has patient fallen in last 6 months? Yes. Number of falls multiple fall, one between last visit and today's eval.   Has a fear of falling.   LIVING ENVIRONMENT: Lives with: lives with their spouse Lives in: House/apartment Stairs: Ramped and level Has following equipment at home: Environmental Consultant - 2 wheeled, Wheelchair (manual), and urinal and BSC, grab bars in bathroom but currently not using shower  OCCUPATION: Retired  PLOF: Independent with household mobility with device and Needs assistance with ADLs  PATIENT GOALS: Return to walking and get in/out of chair with less pain,  stand  NEXT MD VISIT: n/a  OBJECTIVE:  Note: Objective measures were completed at Evaluation unless otherwise noted.  DIAGNOSTIC FINDINGS: 1. Minimally displaced fracture through the base of the greater trochanter. No intertrochanteric extension is identified but fractures can be occult on CT, particularly in the elderly. If there is concern for further fracture, MRI without contrast is the best test for evaluation. 2. Mild left hip osteoarthritis. 3. Large stool ball in the rectosigmoid colon.  PATIENT SURVEYS:  The Patient-Specific Functional Scale   Eval: 12/29/2023   Standing independent 0   2.   Walking independent with FWW 0   3.   Transfers independent 0   4.   Stairs independent 0   AVERAGE       COGNITION: 12/29/2023 Overall cognitive status: Within functional limits for tasks assessed     SENSATION: 12/29/2023 [facility]   MUSCLE LENGTH: 12/29/2023 No specific testing  POSTURE: 12/29/2023 Forward head, rounded shoulders, decreased lumbar lordosis, increased thoracic kyphosis.  Posterior weight displacement in standing posture with flexed trunk.   PALPATION: 12/29/2023 Did not assess  LOWER EXTREMITY ROM:  ROM Right Eval 12/29/2023 Left Eval 12/29/2023  Hip flexion    Hip extension    Hip abduction    Hip adduction    Hip internal rotation    Hip external rotation    Knee flexion    Knee extension    Ankle dorsiflexion    Ankle plantarflexion    Ankle inversion    Ankle eversion     (Blank rows = not tested)  LOWER EXTREMITY MMT:  MMT Right Eval 12/29/2023 Left Eval 12/29/2023  Hip flexion 4+/5 4+/5  Hip extension    Hip abduction Seated isometric (weak, mild pain) Seated isometric  (Weak, mild pain)  Hip adduction    Hip internal rotation    Hip external rotation    Knee flexion 4/5 4/5  Knee extension 4+/5 4+/5  Ankle dorsiflexion 4 4  Ankle plantarflexion  4-  Ankle inversion    Ankle eversion     (Blank rows = not tested, * =  pain)   FUNCTIONAL TESTS:  01/19/2024:    01/19/24 0001  Balance  Balance Assessed Yes  Standardized Balance Assessment  Standardized Balance Assessment Berg Balance Test  Berg Balance Test  Sit to Stand 2  Standing Unsupported 1  Sitting with Back Unsupported but Feet Supported on Floor or Stool 4  Stand to Sit 1  Transfers 2  Standing Unsupported with Eyes Closed 0  Standing Unsupported with Feet Together 0  From Standing, Reach Forward with Outstretched Arm 1  From Standing Position, Pick up Object from Floor 0  From Standing Position, Turn to Look Behind Over each Shoulder 1  Turn 360 Degrees 0  Standing Unsupported, Alternately Place Feet on Step/Stool 1  Standing Unsupported, One Foot in Front 0  Standing on One Leg 0  Total Score 13    12/29/2023 TUG with FWW and CGA:  1 min 45 seconds 18 inch chair transfer: unable without UE assist.   GAIT: 12/29/2023 Eval: Arrived in wheelchair.  Able to perform FWW ambulation with SBA to CGA at times.  Reduce step lengths bilateral, variab                    TREATMENT        DATE:  02/02/2024 Neuro Re-ed (postural awareness, balance improvements)   TherEx  TherActivity (to improve transfers, ambulation, progressive mobility)  Gait Training  TREATMENT        DATE:  01/29/2024 Neuro Re-ed (postural awareness, balance improvements) Seated bilateral shoulder flexion reach with trunk flexion x 10 to improve forward flexion mobility/control.  Standing feet together balance with SBA with verbal cues for weight placmeent forward 1 min Standing hip thrust anteriorly to improve lumbar/hip mobiltiy and awareness of corrections to posterior hip displacements of balance.   TherEx Seated marching x 10 bilateral Seated LAQ x 10 bilateral Seated blue band hip abduction x 30 bilaterally    TherActivity (to improve transfers, ambulation, progressive mobility) Standing pivot transfer with SBA, cues for hand placement and foot  sequencing to improve stability in movement.  Performed in clinc x 4 Nustep Lvl 5 6 mins UE/LE for stepping replication, mobility, push/pull gains.   Pt required trip to bathroom in middle of session.  Clinician provided SBA with chair to toilet transfer with cues on hand placement and foot placement.  CGA provided in self care wipe and don/doff pants, etc.  SBA to occasional min A required for standing at sink during hand washing to prevent LOB posteriorly during attempts  at unassisted standing.     Gait Training Ambulation with FWW 40 ft with CGA with constant cues for step length improvements and foot placement.     TREATMENT        DATE:  01/26/2024  Neuro Re-ed (postural awareness, balance improvements) Postural weight shifts fwd/back on fitter board with single hand assist with CGA to min A at times to prevent LOB posterioly x 15.  Performed 30 sec holds in anterior displacement x 4 Standing hip thrusts forward with feet together to improve upright posture/lumbar mobility and anterior weight displacement.  Bilateral hands on bars with SBA.   Offset modified tandem positioning with anterior weight shift x 15 bilateral with cues for techniques, SBA  : bilateral hands on rails.  Seated red band bilateral forward reach with trunk flexion to initiate forward trunk lean and weight distribution x 10.  Attempted in standing with poor control (loss of balance posterior many times with mod A to correct) x 10 attempts. Standing weight displacement control with mild posterior force on gait belt to initate corrections anteriorly from patient.  Fair performance.  Variable pressure applied.  Performed 30 sec bouts x 3   Gait Training Ambulation household distances < 100 ft x 2 with cues for foot placement, CGA for safety.   Continued encouragement for larger step length and initiation of heel to toe progression.    TREATMENT        DATE:  01/21/2024 Therex: Supine thomas hip flexor stretch with  contralateral foot on table 30 sec x 3 bilaterally (time for cues) Sitting gastroc stretch 30 sec x 3 bilateral (time for cues)  Gait Training Ambulation household distances < 100 ft x 2 with cues for foot placement, CGA for safety.    TherActivity Wheelchair to chair to wheelchair side transfers both directions x 2  with cues for techniques, hand placement and SBA for safety.  Consistent cues required throughout movement.  Supine to sit to supine transfers with log rolling technique focus.  Verbal and visual and tactile cues required throughout movement.  Slow movement pattern due to confusion in activity despite cues.  Supine bridge x 15 for hip mobility and bed mobility improvements.      PATIENT EDUCATION:  Education details: Exam findings, POC, initial HEP, importance of weightbearing Person educated: Patient  Education method: Explanation, Demonstration, Verbal cues, and Handouts Education comprehension: verbalized understanding, returned demonstration, and needs further education  HOME EXERCISE PROGRAM: Access Code: [email or web address] URL: [email or web address] Date: 01/21/2024 Prepared by: Ozell Silvan  Exercises - Supine Bridge with Resistance Band  - 1 x daily - 7 x weekly - 1 sets - 10 reps - Seated Long Arc Quad  - 1-2 x daily - 7 x weekly - 1-2 sets - 10 reps - 2 hold - Seated March  - 1-2 x daily - 7 x weekly - 1-2 sets - 10 reps - Seated Heel Toe Raises  - 1-2 x daily - 7 x weekly - 1-2 sets - 10 reps - Seated Hip Adduction Isometrics with Ball  - 1-2 x daily - 7 x weekly - 1-2 sets - 10 reps - 5 hold - Seated Hip Abduction with Resistance  - 1-2 x daily - 7 x weekly - 1-2 sets - 10-15 reps - Modified Thomas Stretch  - 2-3 x daily - 7 x weekly - 1 sets - 3-5 reps - 30 hold - Seated Gastroc Stretch with Strap  - 2-3 x daily - 7 x weekly -  1 sets - 3-5 reps - 30 hold  ASSESSMENT:  CLINICAL IMPRESSION: Assistance required in bathroom related tasks for  transfers and positioning as noted above.  Able to use time for bathroom trip for education and training on sequencing and positioning to help improve towards independence.  Due to tasks, walking and other activity limited.    OBJECTIVE IMPAIRMENTS: Abnormal gait, decreased activity tolerance, decreased balance, decreased coordination, decreased endurance, decreased mobility, difficulty walking, decreased ROM, decreased strength, hypomobility, increased fascial restrictions, impaired flexibility, improper body mechanics, postural dysfunction, and pain.   ACTIVITY LIMITATIONS: carrying, lifting, bending, standing, squatting, stairs, transfers, bed mobility, bathing, toileting, dressing, hygiene/grooming, and locomotion level  PARTICIPATION LIMITATIONS: cleaning, interpersonal relationship, and community activity  PERSONAL FACTORS: Age, Fitness, Past/current experiences, Time since onset of injury/illness/exacerbation, and 1 comorbidity: R THA are also affecting patient's functional outcome.   REHAB POTENTIAL: Good  CLINICAL DECISION MAKING: Evolving/moderate complexity  EVALUATION COMPLEXITY: Moderate   GOALS: Goals reviewed with patient? Yes  SHORT TERM GOALS: Target date: 3 weeks : 01/19/2024    Pt will be ind with initial HEP Baseline: Goal status: not met - cues still needed   LONG TERM GOALS: Target date: 10 weeks:  03/08/2024   Pt will be ind with management and progression of HEP Baseline:  Goal status:   on going 01/26/2024  2.  Pt will improve TUG < 30 seconds with FWW  to show improved gait speed, reduced fall risk.  Baseline:  Goal status:  on going 01/26/2024  3.  Pt will be able to amb at least 150' with RW modified independent for safe home amb Baseline:  Goal status: on going 01/26/2024  4.  Pt will have improved PSFS average score to > or = 5/10 on average to show reduced disability due to condition.  Baseline:  Goal status:  on going 01/26/2024  5.   Patient will demonstrate bilateral hip strength flexion 5/5, hip abduction 4/5 , knee extension and flexion 5/5 to facilitate progressive mobility improvements.  Baseline:  Goal status:  on going 01/26/2024    PLAN:  PT FREQUENCY: 2x/week  PT DURATION: 10 weeks  PLANNED INTERVENTIONS: 97164- PT Re-evaluation, 97750- Physical Performance Testing, 97110-Therapeutic exercises, 97530- Therapeutic activity, 97112- Neuromuscular re-education, 97535- Self Care, 02859- Manual therapy, [phone]- Gait training, [phone]- Aquatic Therapy, [phone]- Electrical stimulation (unattended), 97016- Vasopneumatic device, [id number]- Ultrasound, [id number]- Ionotophoresis 4mg /ml Dexamethasone , 79439 (1-2 muscles), 20561 (3+ muscles)- Dry Needling, Patient/Family education, Balance training, Stair training, Taping, Joint mobilization, Cryotherapy, and Moist heat  PLAN FOR NEXT SESSION:  Postural awareness, mobility gains. Balance challenges.  Ozell Silvan, PT, DPT, OCS, ATC 02/02/24  8:11 AM

## 2024-02-04 ENCOUNTER — Encounter: Payer: Self-pay | Admitting: Rehabilitative and Restorative Service Providers"

## 2024-02-04 ENCOUNTER — Ambulatory Visit (INDEPENDENT_AMBULATORY_CARE_PROVIDER_SITE_OTHER): Admitting: Rehabilitative and Restorative Service Providers"

## 2024-02-04 DIAGNOSIS — M5459 Other low back pain: Secondary | ICD-10-CM | POA: Diagnosis not present

## 2024-02-04 DIAGNOSIS — M25552 Pain in left hip: Secondary | ICD-10-CM

## 2024-02-04 DIAGNOSIS — R262 Difficulty in walking, not elsewhere classified: Secondary | ICD-10-CM

## 2024-02-04 DIAGNOSIS — R296 Repeated falls: Secondary | ICD-10-CM

## 2024-02-04 DIAGNOSIS — M6281 Muscle weakness (generalized): Secondary | ICD-10-CM

## 2024-02-04 DIAGNOSIS — M25551 Pain in right hip: Secondary | ICD-10-CM | POA: Diagnosis not present

## 2024-02-04 NOTE — Therapy (Signed)
 OUTPATIENT PHYSICAL THERAPY TREATMENT   Patient Name: Lee Nelson MRN: [id number] DOB:May 05, 1940, 83 y.o., male Today's Date: 02/04/2024  END OF SESSION:  PT End of Session - 02/04/24 1226     Visit Number 6    Number of Visits 20    Date for Recertification  03/08/24    Authorization Type Medicare    Progress Note Due on Visit 10    PT Start Time 1200    PT Stop Time 1229    PT Time Calculation (min) 29 min    Activity Tolerance Patient tolerated treatment well    Behavior During Therapy [facility] for tasks assessed/performed             Past Medical History:  Diagnosis Date   Anxiety    Arthritis    Bladder calculus    BPH (benign prostatic hyperplasia)    Chronic constipation    Complication of anesthesia     I had some coughing afterwards for a couple of days--  per pt perfers spinal anesthesia since general anesthesia congnitive issues when older   Diverticulosis of colon    Dry eye syndrome of both eyes    Environmental allergies    GERD (gastroesophageal reflux disease)    occasional   History of adenomatous polyp of colon    08/ 2004   History of kidney stones    History of squamous cell carcinoma in situ (SCCIS) of skin    s/p  excision 2013 facial areas and 06/ 2016 nose   Migraine    eye migraine occasional   Seasonal and perennial allergic rhinitis    Thrombocytopenia    Tingling    hands and feet bilat , intermittantly-- per pt has lumbar bulging disk   Urinary frequency    Vocal fold atrophy    dysphonia-- per pt has to drink large amount of water  to take even on pill   Wears glasses    Past Surgical History:  Procedure Laterality Date   APPENDECTOMY  child   CARDIOVASCULAR STRESS TEST  05-17-2015  dr hilty   Low risk nuclear study w/ no ischemia/  normal LV function and wall motion , stress ef 54% (lvef 45-54%)   CHOLECYSTECTOMY N/A 09/29/2014   Procedure: LAPAROSCOPIC CHOLECYSTECTOMY WITH INTRAOPERATIVE CHOLANGIOGRAM;  Surgeon: Alm Angle, MD;  Location: [facility];  Service: General;  Laterality: N/A;   COLONOSCOPY  last one 09-06-2010   ESOPHAGOGASTRODUODENOSCOPY N/A 12/09/2013   Procedure: ESOPHAGOGASTRODUODENOSCOPY (EGD);  Surgeon: Elsie Cree, MD;  Location: [facility] END[facility]COPY;  Service: Endoscopy;  Laterality: N/A;   EXTRACORPOREAL SHOCK WAVE LITHOTRIPSY  yrs ago   INGUINAL HERNIA REPAIR Left child   inguinal hernia repair  09/2017   NISSEN FUNDOPLICATION  1980's   open   STONE EXTRACTION WITH BASKET N/A 08/18/2016   Procedure: STONE EXTRACTION WITH BASKET;  Surgeon: Chales Idol, MD;  Location: [facility];  Service: Urology;  Laterality: N/A;   THULIUM LASER TURP (TRANSURETHRAL RESECTION OF PROSTATE) N/A 08/18/2016   Procedure: THULIUM LASER BLADDER NECK INCISION AND BLADDER STONE REMOVAL;  Surgeon: Chales Idol, MD;  Location: [facility];  Service: Urology;  Laterality: N/A;   TONSILLECTOMY  child   TOTAL HIP ARTHROPLASTY Right 09/07/2022   Procedure: TOTAL HIP ARTHROPLASTY ANTERIOR APPROACH;  Surgeon: Vernetta Lonni GRADE, MD;  Location: [facility];  Service: Orthopedics;  Laterality: Right;   TRANSTHORACIC ECHOCARDIOGRAM  11/18/2010   grade 1 diastolic dysfunction, ef 55-60%/  trivial MR and TR/ mild  dilated RA   Patient Active Problem List   Diagnosis Date Noted   Status post total replacement of right hip 09/22/2022   Protein-calorie malnutrition, severe 09/08/2022   Hip fracture (HCC) 09/07/2022   BPH (benign prostatic hyperplasia) 09/07/2022   Closed fracture of right femur, unspecified fracture morphology, initial encounter (HCC) 09/07/2022   Fall 09/07/2022   Closed subcapital fracture of neck of femur, right, initial encounter (HCC) 09/07/2022   Memory impairment 08/06/2022   Pneumonia due to COVID-19 virus 05/06/2022   Severe sepsis (HCC) 05/06/2022   Acute hypoxemic respiratory failure (HCC) 05/06/2022   NPH (normal pressure hydrocephalus) (HCC) 05/06/2022    Physical deconditioning 05/06/2022   Thrombocytopenia 05/06/2022   Dementia (HCC) 05/06/2022   SBO (small bowel obstruction) (HCC) 12/31/2019   Amaurosis fugax of right eye 08/25/2017   PSVT (paroxysmal supraventricular tachycardia) 09/12/2015   Chest pain 05/03/2015   Dyspnea 05/03/2015   ALLERGIC RHINITIS 09/21/2007   G E R D 09/21/2007   SMOKE INHALATION 09/21/2007   OSTEOPOROSIS 01/28/2007   PALPITATIONS 01/28/2007   COUGH 01/28/2007   ALLERGY 01/28/2007    PCP: Janey Santos, MD  REFERRING PROVIDER: Addie Cordella Hamilton, MD   REFERRING DIAG:  W19.XXXD (ICD-10-CM) - Fall, subsequent encounter  M79.671 (ICD-10-CM) - Pain in right foot  M25.561 (ICD-10-CM) - Right knee pain, unspecified chronicity  M54.50 (ICD-10-CM) - Low back pain, unspecified back pain laterality, unspecified chronicity, unspecified whether sciatica present  M25.551 (ICD-10-CM) - Pain in right hip    THERAPY DIAG:  Other low back pain  Pain in left hip  Pain in right hip  Muscle weakness (generalized)  Difficulty in walking, not elsewhere classified  Repeated falls  Rationale for Evaluation and Treatment: Rehabilitation  ONSET DATE: Acute on chronic.  Fall reported 11/01/2023  SUBJECTIVE:   SUBJECTIVE STATEMENT: Pt reported some mild complaints of back and Rt hip pain upon arrival but not bad.   PERTINENT HISTORY:  Rt THA June 2024, Hx of dementia, anxiety, OA, osteoporosis, BPH, skin cancer, PSVT, chronic thrombocytopenia, shingles,   PAIN:  PRS scale: mild (no specific number provided)  Pain location: posterior Rt hip, back.   Pain description: aching at rest but can be sharp  Aggravating factors: Sometimes getting out of bed, sitting to standing Relieving factors: Heat, pain medication   PRECAUTIONS:  None current  RED FLAGS: None   WEIGHT BEARING RESTRICTIONS: WBAT  FALLS:  Has patient fallen in last 6 months? Yes. Number of falls multiple fall, one between last visit and  today's eval.   Has a fear of falling.   LIVING ENVIRONMENT: Lives with: lives with their spouse Lives in: House/apartment Stairs: Ramped and level Has following equipment at home: Environmental Consultant - 2 wheeled, Wheelchair (manual), and urinal and BSC, grab bars in bathroom but currently not using shower  OCCUPATION: Retired  PLOF: Independent with household mobility with device and Needs assistance with ADLs  PATIENT GOALS: Return to walking and get in/out of chair with less pain, stand  NEXT MD VISIT: n/a  OBJECTIVE:  Note: Objective measures were completed at Evaluation unless otherwise noted.  DIAGNOSTIC FINDINGS: 1. Minimally displaced fracture through the base of the greater trochanter. No intertrochanteric extension is identified but fractures can be occult on CT, particularly in the elderly. If there is concern for further fracture, MRI without contrast is the best test for evaluation. 2. Mild left hip osteoarthritis. 3. Large stool ball in the rectosigmoid colon.  PATIENT SURVEYS:  The Patient-Specific Functional Scale  Eval: 12/29/2023 02/04/2024 (asked of patient but evaluated in part due to clinical observation in clinic)   Standing independent 0 6  2.   Walking independent with FWW 0 6  3.   Transfers independent 0 5  4.   Stairs independent 0 0 unable   AVERAGE 0 4.25 avg     COGNITION: 12/29/2023 Overall cognitive status: Within functional limits for tasks assessed     SENSATION: 12/29/2023 [facility]   MUSCLE LENGTH: 12/29/2023 No specific testing  POSTURE: 12/29/2023 Forward head, rounded shoulders, decreased lumbar lordosis, increased thoracic kyphosis.  Posterior weight displacement in standing posture with flexed trunk.   PALPATION: 12/29/2023 Did not assess  LOWER EXTREMITY ROM:   ROM Right Eval 12/29/2023 Left Eval 12/29/2023  Hip flexion    Hip extension    Hip abduction    Hip adduction    Hip internal rotation    Hip external rotation    Knee  flexion    Knee extension    Ankle dorsiflexion    Ankle plantarflexion    Ankle inversion    Ankle eversion     (Blank rows = not tested)  LOWER EXTREMITY MMT:  MMT Right Eval 12/29/2023 Left Eval 12/29/2023  Hip flexion 4+/5 4+/5  Hip extension    Hip abduction Seated isometric (weak, mild pain) Seated isometric  (Weak, mild pain)  Hip adduction    Hip internal rotation    Hip external rotation    Knee flexion 4/5 4/5  Knee extension 4+/5 4+/5  Ankle dorsiflexion 4 4  Ankle plantarflexion  4-  Ankle inversion    Ankle eversion     (Blank rows = not tested, * = pain)   FUNCTIONAL TESTS:  01/19/2024:    01/19/24 0001  Balance  Balance Assessed Yes  Standardized Balance Assessment  Standardized Balance Assessment Berg Balance Test  Berg Balance Test  Sit to Stand 2  Standing Unsupported 1  Sitting with Back Unsupported but Feet Supported on Floor or Stool 4  Stand to Sit 1  Transfers 2  Standing Unsupported with Eyes Closed 0  Standing Unsupported with Feet Together 0  From Standing, Reach Forward with Outstretched Arm 1  From Standing Position, Pick up Object from Floor 0  From Standing Position, Turn to Look Behind Over each Shoulder 1  Turn 360 Degrees 0  Standing Unsupported, Alternately Place Feet on Step/Stool 1  Standing Unsupported, One Foot in Front 0  Standing on One Leg 0  Total Score 13    12/29/2023 TUG with FWW and CGA:  1 min 45 seconds 18 inch chair transfer: unable without UE assist.   GAIT: 12/29/2023 Eval: Arrived in wheelchair.  Able to perform FWW ambulation with SBA to CGA at times.  Reduce step lengths bilateral, variab                    TREATMENT        DATE:  02/04/2024 Neuro Re-ed (postural awareness, balance improvements) Seated red band rows 2 x 15 Seated red band bilateral UE press into lumbar flexion for weight shift forward x 15   TherActivity (to improve transfers, ambulation, progressive mobility) Pivot transfer Lt  and Rt with constant cues verbally and tactically to promote improved center of mass balance control, leg placement and arm placement for safety in movement.  Min A at times to prevent loss of balance posteriorly.  Nustep Lvl 5 8 mins UE/LE for stepping replication, mobility, push/pull gains. Cues for  full range movement focus.    Gait Training FWW with CGA with verbal cues throughout ambulation to promote step length, anterior weight placement to improve stability.  Performed in clinic level surfaces 50 ft, 30 ft.   Therex: Standing gastroc stretch 30 sec x 3 incline board with CGA. Additional time spent in cues of activity.   TREATMENT        DATE:  01/29/2024 Neuro Re-ed (postural awareness, balance improvements) Seated bilateral shoulder flexion reach with trunk flexion x 10 to improve forward flexion mobility/control.  Standing feet together balance with SBA with verbal cues for weight placmeent forward 1 min Standing hip thrust anteriorly to improve lumbar/hip mobiltiy and awareness of corrections to posterior hip displacements of balance.   TherEx Seated marching x 10 bilateral Seated LAQ x 10 bilateral Seated blue band hip abduction x 30 bilaterally    TherActivity (to improve transfers, ambulation, progressive mobility) Standing pivot transfer with SBA, cues for hand placement and foot sequencing to improve stability in movement.  Performed in clinc x 4 Nustep Lvl 5 6 mins UE/LE for stepping replication, mobility, push/pull gains.   Pt required trip to bathroom in middle of session.  Clinician provided SBA with chair to toilet transfer with cues on hand placement and foot placement.  CGA provided in self care wipe and don/doff pants, etc.  SBA to occasional min A required for standing at sink during hand washing to prevent LOB posteriorly during attempts at unassisted standing.     Gait Training Ambulation with FWW 40 ft with CGA with constant cues for step length improvements  and foot placement.     TREATMENT        DATE:  01/26/2024  Neuro Re-ed (postural awareness, balance improvements) Postural weight shifts fwd/back on fitter board with single hand assist with CGA to min A at times to prevent LOB posterioly x 15.  Performed 30 sec holds in anterior displacement x 4 Standing hip thrusts forward with feet together to improve upright posture/lumbar mobility and anterior weight displacement.  Bilateral hands on bars with SBA.   Offset modified tandem positioning with anterior weight shift x 15 bilateral with cues for techniques, SBA  : bilateral hands on rails.  Seated red band bilateral forward reach with trunk flexion to initiate forward trunk lean and weight distribution x 10.  Attempted in standing with poor control (loss of balance posterior many times with mod A to correct) x 10 attempts. Standing weight displacement control with mild posterior force on gait belt to initate corrections anteriorly from patient.  Fair performance.  Variable pressure applied.  Performed 30 sec bouts x 3   Gait Training Ambulation household distances < 100 ft x 2 with cues for foot placement, CGA for safety.   Continued encouragement for larger step length and initiation of heel to toe progression.    TREATMENT        DATE:  01/21/2024 Therex: Supine thomas hip flexor stretch with contralateral foot on table 30 sec x 3 bilaterally (time for cues) Sitting gastroc stretch 30 sec x 3 bilateral (time for cues)  Gait Training Ambulation household distances < 100 ft x 2 with cues for foot placement, CGA for safety.    TherActivity Wheelchair to chair to wheelchair side transfers both directions x 2  with cues for techniques, hand placement and SBA for safety.  Consistent cues required throughout movement.  Supine to sit to supine transfers with log rolling technique focus.  Verbal and visual and  tactile cues required throughout movement.  Slow movement pattern due to confusion in  activity despite cues.  Supine bridge x 15 for hip mobility and bed mobility improvements.      PATIENT EDUCATION:  Education details: Exam findings, POC, initial HEP, importance of weightbearing Person educated: Patient  Education method: Explanation, Demonstration, Verbal cues, and Handouts Education comprehension: verbalized understanding, returned demonstration, and needs further education  HOME EXERCISE PROGRAM: Access Code: [email or web address] URL: [email or web address] Date: 01/21/2024 Prepared by: Ozell Silvan  Exercises - Supine Bridge with Resistance Band  - 1 x daily - 7 x weekly - 1 sets - 10 reps - Seated Long Arc Quad  - 1-2 x daily - 7 x weekly - 1-2 sets - 10 reps - 2 hold - Seated March  - 1-2 x daily - 7 x weekly - 1-2 sets - 10 reps - Seated Heel Toe Raises  - 1-2 x daily - 7 x weekly - 1-2 sets - 10 reps - Seated Hip Adduction Isometrics with Ball  - 1-2 x daily - 7 x weekly - 1-2 sets - 10 reps - 5 hold - Seated Hip Abduction with Resistance  - 1-2 x daily - 7 x weekly - 1-2 sets - 10-15 reps - Modified Thomas Stretch  - 2-3 x daily - 7 x weekly - 1 sets - 3-5 reps - 30 hold - Seated Gastroc Stretch with Strap  - 2-3 x daily - 7 x weekly - 1 sets - 3-5 reps - 30 hold  ASSESSMENT:  CLINICAL IMPRESSION: Limited treatment time due to late arrival.  Similar presentation in transfers and standing posture (posterior weight displacement).  Constant reminder cues for sequencing of movement and tasks due in part to lacking cognitive recall.  Continued skilled PT services may benefit to improve progressive mobility independence with reduced fall risk.   OBJECTIVE IMPAIRMENTS: Abnormal gait, decreased activity tolerance, decreased balance, decreased coordination, decreased endurance, decreased mobility, difficulty walking, decreased ROM, decreased strength, hypomobility, increased fascial restrictions, impaired flexibility, improper body mechanics, postural  dysfunction, and pain.   ACTIVITY LIMITATIONS: carrying, lifting, bending, standing, squatting, stairs, transfers, bed mobility, bathing, toileting, dressing, hygiene/grooming, and locomotion level  PARTICIPATION LIMITATIONS: cleaning, interpersonal relationship, and community activity  PERSONAL FACTORS: Age, Fitness, Past/current experiences, Time since onset of injury/illness/exacerbation, and 1 comorbidity: R THA are also affecting patient's functional outcome.   REHAB POTENTIAL: Good  CLINICAL DECISION MAKING: Evolving/moderate complexity  EVALUATION COMPLEXITY: Moderate   GOALS: Goals reviewed with patient? Yes  SHORT TERM GOALS: Target date: 3 weeks : 01/19/2024    Pt will be ind with initial HEP Baseline: Goal status: not met - cues still needed   LONG TERM GOALS: Target date: 10 weeks:  03/08/2024   Pt will be ind with management and progression of HEP Baseline:  Goal status:   on going 01/26/2024  2.  Pt will improve TUG < 30 seconds with FWW  to show improved gait speed, reduced fall risk.  Baseline:  Goal status:  on going 01/26/2024  3.  Pt will be able to amb at least 150' with RW modified independent for safe home amb Baseline:  Goal status: on going 01/26/2024  4.  Pt will have improved PSFS average score to > or = 5/10 on average to show reduced disability due to condition.  Baseline:  Goal status:  on going 01/26/2024  5.  Patient will demonstrate bilateral hip strength flexion 5/5, hip abduction 4/5 , knee extension and flexion 5/5 to  facilitate progressive mobility improvements.  Baseline:  Goal status:  on going 01/26/2024    PLAN:  PT FREQUENCY: 2x/week  PT DURATION: 10 weeks  PLANNED INTERVENTIONS: 97164- PT Re-evaluation, 97750- Physical Performance Testing, 97110-Therapeutic exercises, 97530- Therapeutic activity, 97112- Neuromuscular re-education, 97535- Self Care, 02859- Manual therapy, [phone]- Gait training, [phone]- Aquatic Therapy,  [phone]- Electrical stimulation (unattended), 97016- Vasopneumatic device, [id number]- Ultrasound, [id number]- Ionotophoresis 4mg /ml Dexamethasone , 79439 (1-2 muscles), [id number] (3+ muscles)- Dry Needling, Patient/Family education, Balance training, Stair training, Taping, Joint mobilization, Cryotherapy, and Moist heat  PLAN FOR NEXT SESSION:  Continue to address transfers, standing posture and ambulation for household independence with progressive mobility.   Ozell Silvan, PT, DPT, OCS, ATC 02/04/24  1:01 PM

## 2024-02-09 ENCOUNTER — Encounter: Payer: Self-pay | Admitting: Rehabilitative and Restorative Service Providers"

## 2024-02-09 ENCOUNTER — Ambulatory Visit (INDEPENDENT_AMBULATORY_CARE_PROVIDER_SITE_OTHER): Admitting: Rehabilitative and Restorative Service Providers"

## 2024-02-09 DIAGNOSIS — M6281 Muscle weakness (generalized): Secondary | ICD-10-CM

## 2024-02-09 DIAGNOSIS — M25552 Pain in left hip: Secondary | ICD-10-CM | POA: Diagnosis not present

## 2024-02-09 DIAGNOSIS — R296 Repeated falls: Secondary | ICD-10-CM

## 2024-02-09 DIAGNOSIS — M25551 Pain in right hip: Secondary | ICD-10-CM

## 2024-02-09 DIAGNOSIS — R262 Difficulty in walking, not elsewhere classified: Secondary | ICD-10-CM

## 2024-02-09 DIAGNOSIS — M5459 Other low back pain: Secondary | ICD-10-CM

## 2024-02-09 NOTE — Therapy (Signed)
 OUTPATIENT PHYSICAL THERAPY TREATMENT   Patient Name: Lee Nelson MRN: [id number] DOB:04-05-1940, 83 y.o., male Today's Date: 02/09/2024  END OF SESSION:  PT End of Session - 02/09/24 1141     Visit Number 7    Number of Visits 20    Date for Recertification  03/08/24    Authorization Type Medicare    Progress Note Due on Visit 10    PT Start Time 1145    PT Stop Time 1224    PT Time Calculation (min) 39 min    Activity Tolerance Patient tolerated treatment well    Behavior During Therapy WFL for tasks assessed/performed           Past Medical History:  Diagnosis Date   Anxiety    Arthritis    Bladder calculus    BPH (benign prostatic hyperplasia)    Chronic constipation    Complication of anesthesia     I had some coughing afterwards for a couple of days--  per pt perfers spinal anesthesia since general anesthesia congnitive issues when older   Diverticulosis of colon    Dry eye syndrome of both eyes    Environmental allergies    GERD (gastroesophageal reflux disease)    occasional   History of adenomatous polyp of colon    08/ 2004   History of kidney stones    History of squamous cell carcinoma in situ (SCCIS) of skin    s/p  excision 2013 facial areas and 06/ 2016 nose   Migraine    eye migraine occasional   Seasonal and perennial allergic rhinitis    Thrombocytopenia    Tingling    hands and feet bilat , intermittantly-- per pt has lumbar bulging disk   Urinary frequency    Vocal fold atrophy    dysphonia-- per pt has to drink large amount of water  to take even on pill   Wears glasses    Past Surgical History:  Procedure Laterality Date   APPENDECTOMY  child   CARDIOVASCULAR STRESS TEST  05-17-2015  dr hilty   Low risk nuclear study w/ no ischemia/  normal LV function and wall motion , stress ef 54% (lvef 45-54%)   CHOLECYSTECTOMY N/A 09/29/2014   Procedure: LAPAROSCOPIC CHOLECYSTECTOMY WITH INTRAOPERATIVE CHOLANGIOGRAM;  Surgeon: Alm Angle, MD;  Location: [facility];  Service: General;  Laterality: N/A;   COLONOSCOPY  last one 09-06-2010   ESOPHAGOGASTRODUODENOSCOPY N/A 12/09/2013   Procedure: ESOPHAGOGASTRODUODENOSCOPY (EGD);  Surgeon: Elsie Cree, MD;  Location: [facility];  Service: Endoscopy;  Laterality: N/A;   EXTRACORPOREAL SHOCK WAVE LITHOTRIPSY  yrs ago   INGUINAL HERNIA REPAIR Left child   inguinal hernia repair  09/2017   NISSEN FUNDOPLICATION  1980's   open   STONE EXTRACTION WITH BASKET N/A 08/18/2016   Procedure: STONE EXTRACTION WITH BASKET;  Surgeon: Chales Idol, MD;  Location: [facility]  Service: Urology;  Laterality: N/A;   THULIUM LASER TURP (TRANSURETHRAL RESECTION OF PROSTATE) N/A 08/18/2016   Procedure: THULIUM LASER BLADDER NECK INCISION AND BLADDER STONE REMOVAL;  Surgeon: Chales Idol, MD;  Location: [facility];  Service: Urology;  Laterality: N/A;   TONSILLECTOMY  child   TOTAL HIP ARTHROPLASTY Right 09/07/2022   Procedure: TOTAL HIP ARTHROPLASTY ANTERIOR APPROACH;  Surgeon: Vernetta Lonni GRADE, MD;  Location: [facility];  Service: Orthopedics;  Laterality: Right;   TRANSTHORACIC ECHOCARDIOGRAM  11/18/2010   grade 1 diastolic dysfunction, ef 55-60%/  trivial MR and TR/ mild dilated RA  Patient Active Problem List   Diagnosis Date Noted   Status post total replacement of right hip 09/22/2022   Protein-calorie malnutrition, severe 09/08/2022   Hip fracture (HCC) 09/07/2022   BPH (benign prostatic hyperplasia) 09/07/2022   Closed fracture of right femur, unspecified fracture morphology, initial encounter (HCC) 09/07/2022   Fall 09/07/2022   Closed subcapital fracture of neck of femur, right, initial encounter (HCC) 09/07/2022   Memory impairment 08/06/2022   Pneumonia due to COVID-19 virus 05/06/2022   Severe sepsis (HCC) 05/06/2022   Acute hypoxemic respiratory failure (HCC) 05/06/2022   NPH (normal pressure hydrocephalus) (HCC) 05/06/2022    Physical deconditioning 05/06/2022   Thrombocytopenia 05/06/2022   Dementia (HCC) 05/06/2022   SBO (small bowel obstruction) (HCC) 12/31/2019   Amaurosis fugax of right eye 08/25/2017   PSVT (paroxysmal supraventricular tachycardia) 09/12/2015   Chest pain 05/03/2015   Dyspnea 05/03/2015   ALLERGIC RHINITIS 09/21/2007   G E R D 09/21/2007   SMOKE INHALATION 09/21/2007   OSTEOPOROSIS 01/28/2007   PALPITATIONS 01/28/2007   COUGH 01/28/2007   ALLERGY 01/28/2007    PCP: Janey Santos, MD  REFERRING PROVIDER: Addie Cordella Hamilton, MD   REFERRING DIAG:  W19.XXXD (ICD-10-CM) - Fall, subsequent encounter  M79.671 (ICD-10-CM) - Pain in right foot  M25.561 (ICD-10-CM) - Right knee pain, unspecified chronicity  M54.50 (ICD-10-CM) - Low back pain, unspecified back pain laterality, unspecified chronicity, unspecified whether sciatica present  M25.551 (ICD-10-CM) - Pain in right hip    THERAPY DIAG:  Other low back pain  Pain in left hip  Pain in right hip  Muscle weakness (generalized)  Difficulty in walking, not elsewhere classified  Repeated falls  Rationale for Evaluation and Treatment: Rehabilitation  ONSET DATE: Acute on chronic.  Fall reported 11/01/2023  SUBJECTIVE:   SUBJECTIVE STATEMENT: Pt indicated doing ok today.  Pt indicated doing some of the exercise.   PERTINENT HISTORY:  Rt THA June 2024, Hx of dementia, anxiety, OA, osteoporosis, BPH, skin cancer, PSVT, chronic thrombocytopenia, shingles,   PAIN:  PRS scale: mild (no specific number provided)  Pain location: posterior Rt hip, back.   Pain description: aching at rest but can be sharp  Aggravating factors: Sometimes getting out of bed, sitting to standing Relieving factors: Heat, pain medication   PRECAUTIONS:  None current  RED FLAGS: None   WEIGHT BEARING RESTRICTIONS: WBAT  FALLS:  Has patient fallen in last 6 months? Yes. Number of falls multiple fall, one between last visit and today's  eval.   Has a fear of falling.   LIVING ENVIRONMENT: Lives with: lives with their spouse Lives in: House/apartment Stairs: Ramped and level Has following equipment at home: Environmental Consultant - 2 wheeled, Wheelchair (manual), and urinal and BSC, grab bars in bathroom but currently not using shower  OCCUPATION: Retired  PLOF: Independent with household mobility with device and Needs assistance with ADLs  PATIENT GOALS: Return to walking and get in/out of chair with less pain, stand  NEXT MD VISIT: n/a  OBJECTIVE:  Note: Objective measures were completed at Evaluation unless otherwise noted.  DIAGNOSTIC FINDINGS: 1. Minimally displaced fracture through the base of the greater trochanter. No intertrochanteric extension is identified but fractures can be occult on CT, particularly in the elderly. If there is concern for further fracture, MRI without contrast is the best test for evaluation. 2. Mild left hip osteoarthritis. 3. Large stool ball in the rectosigmoid colon.  PATIENT SURVEYS:  The Patient-Specific Functional Scale   Eval: 12/29/2023 02/04/2024 (asked of  patient but evaluated in part due to clinical observation in clinic)   Standing independent 0 6  2.   Walking independent with FWW 0 6  3.   Transfers independent 0 5  4.   Stairs independent 0 0 unable   AVERAGE 0 4.25 avg     COGNITION: 12/29/2023 Overall cognitive status: Within functional limits for tasks assessed     SENSATION: 12/29/2023 [facility]   MUSCLE LENGTH: 12/29/2023 No specific testing  POSTURE: 12/29/2023 Forward head, rounded shoulders, decreased lumbar lordosis, increased thoracic kyphosis.  Posterior weight displacement in standing posture with flexed trunk.   PALPATION: 12/29/2023 Did not assess  LOWER EXTREMITY ROM:   ROM Right Eval 12/29/2023 Left Eval 12/29/2023  Hip flexion    Hip extension    Hip abduction    Hip adduction    Hip internal rotation    Hip external rotation    Knee flexion     Knee extension    Ankle dorsiflexion    Ankle plantarflexion    Ankle inversion    Ankle eversion     (Blank rows = not tested)  LOWER EXTREMITY MMT:  MMT Right Eval 12/29/2023 Left Eval 12/29/2023  Hip flexion 4+/5 4+/5  Hip extension    Hip abduction Seated isometric (weak, mild pain) Seated isometric  (Weak, mild pain)  Hip adduction    Hip internal rotation    Hip external rotation    Knee flexion 4/5 4/5  Knee extension 4+/5 4+/5  Ankle dorsiflexion 4 4  Ankle plantarflexion  4-  Ankle inversion    Ankle eversion     (Blank rows = not tested, * = pain)   FUNCTIONAL TESTS:  01/19/2024:    01/19/24 0001  Balance  Balance Assessed Yes  Standardized Balance Assessment  Standardized Balance Assessment Berg Balance Test  Berg Balance Test  Sit to Stand 2  Standing Unsupported 1  Sitting with Back Unsupported but Feet Supported on Floor or Stool 4  Stand to Sit 1  Transfers 2  Standing Unsupported with Eyes Closed 0  Standing Unsupported with Feet Together 0  From Standing, Reach Forward with Outstretched Arm 1  From Standing Position, Pick up Object from Floor 0  From Standing Position, Turn to Look Behind Over each Shoulder 1  Turn 360 Degrees 0  Standing Unsupported, Alternately Place Feet on Step/Stool 1  Standing Unsupported, One Foot in Front 0  Standing on One Leg 0  Total Score 13    12/29/2023 TUG with FWW and CGA:  1 min 45 seconds 18 inch chair transfer: unable without UE assist.   GAIT: 02/09/2024: Ambulation in clinic with FWW with SBA, gait belt.  Shuffling gait, posterior weight shift displacement.  Forward trunk lean/head down positioning.  Able to perform level surfaces < 100 ft within clinic.  Reduced gait speed due to step length deficits.   12/29/2023 Eval: Arrived in wheelchair.  Able to perform FWW ambulation with SBA to CGA at times.  Reduce step lengths bilateral, variab                    TREATMENT        DATE:   02/09/2024 Neuro Re-ed (postural awareness, balance improvements) Postural weight shifts fwd/back on fitter board with single hand assist with CGA to min A at times to prevent LOB posterioly x 15.  Performed 60 sec holds in anterior displacement x 2 Standing hip thrusts forward with feet together to improve upright posture/lumbar  mobility and anterior weight displacement.  Bilateral hands on bars with SBA.    Gait Training FWW with CGA with verbal cues throughout ambulation to promote step length, anterior weight placement to improve stability.  Performed in clinic level surfaces 40 ft, 40 ft.  BIG inspired forward stepping x 12 bilateral with no UE assist, CGA to min A at times.  Cues for step length and return to start in one step motion vs. Multiple to improve weight shift.    TherActivity (to improve transfers, ambulation, progressive mobility) Nustep lvl 6 6 mins UE/LE Pivot transfer to Lt and Rt with constant cues verbally and tactically to promote improved center of mass balance control, leg placement and arm placement for safety in movement.  SBA.    TREATMENT        DATE:  02/04/2024 Neuro Re-ed (postural awareness, balance improvements) Seated red band rows 2 x 15 Seated red band bilateral UE press into lumbar flexion for weight shift forward x 15   TherActivity (to improve transfers, ambulation, progressive mobility) Pivot transfer Lt and Rt with constant cues verbally and tactically to promote improved center of mass balance control, leg placement and arm placement for safety in movement.  Min A at times to prevent loss of balance posteriorly.  Nustep Lvl 5 8 mins UE/LE for stepping replication, mobility, push/pull gains. Cues for full range movement focus.    Gait Training FWW with CGA with verbal cues throughout ambulation to promote step length, anterior weight placement to improve stability.  Performed in clinic level surfaces 50 ft, 30 ft.   Therex: Standing gastroc  stretch 30 sec x 3 incline board with CGA. Additional time spent in cues of activity.   TREATMENT        DATE:  01/29/2024 Neuro Re-ed (postural awareness, balance improvements) Seated bilateral shoulder flexion reach with trunk flexion x 10 to improve forward flexion mobility/control.  Standing feet together balance with SBA with verbal cues for weight placmeent forward 1 min Standing hip thrust anteriorly to improve lumbar/hip mobiltiy and awareness of corrections to posterior hip displacements of balance.   TherEx Seated marching x 10 bilateral Seated LAQ x 10 bilateral Seated blue band hip abduction x 30 bilaterally    TherActivity (to improve transfers, ambulation, progressive mobility) Standing pivot transfer with SBA, cues for hand placement and foot sequencing to improve stability in movement.  Performed in clinc x 4 Nustep Lvl 5 6 mins UE/LE for stepping replication, mobility, push/pull gains.   Pt required trip to bathroom in middle of session.  Clinician provided SBA with chair to toilet transfer with cues on hand placement and foot placement.  CGA provided in self care wipe and don/doff pants, etc.  SBA to occasional min A required for standing at sink during hand washing to prevent LOB posteriorly during attempts at unassisted standing.     Gait Training Ambulation with FWW 40 ft with CGA with constant cues for step length improvements and foot placement.     TREATMENT        DATE:  01/26/2024  Neuro Re-ed (postural awareness, balance improvements) Postural weight shifts fwd/back on fitter board with single hand assist with CGA to min A at times to prevent LOB posterioly x 15.  Performed 30 sec holds in anterior displacement x 4 Standing hip thrusts forward with feet together to improve upright posture/lumbar mobility and anterior weight displacement.  Bilateral hands on bars with SBA.   Offset modified tandem positioning with anterior weight  shift x 15 bilateral with cues  for techniques, SBA  : bilateral hands on rails.  Seated red band bilateral forward reach with trunk flexion to initiate forward trunk lean and weight distribution x 10.  Attempted in standing with poor control (loss of balance posterior many times with mod A to correct) x 10 attempts. Standing weight displacement control with mild posterior force on gait belt to initate corrections anteriorly from patient.  Fair performance.  Variable pressure applied.  Performed 30 sec bouts x 3   Gait Training Ambulation household distances < 100 ft x 2 with cues for foot placement, CGA for safety.   Continued encouragement for larger step length and initiation of heel to toe progression.    TREATMENT        DATE:  01/21/2024 Therex: Supine thomas hip flexor stretch with contralateral foot on table 30 sec x 3 bilaterally (time for cues) Sitting gastroc stretch 30 sec x 3 bilateral (time for cues)  Gait Training Ambulation household distances < 100 ft x 2 with cues for foot placement, CGA for safety.    TherActivity Wheelchair to chair to wheelchair side transfers both directions x 2  with cues for techniques, hand placement and SBA for safety.  Consistent cues required throughout movement.  Supine to sit to supine transfers with log rolling technique focus.  Verbal and visual and tactile cues required throughout movement.  Slow movement pattern due to confusion in activity despite cues.  Supine bridge x 15 for hip mobility and bed mobility improvements.      PATIENT EDUCATION:  Education details: Exam findings, POC, initial HEP, importance of weightbearing Person educated: Patient  Education method: Explanation, Demonstration, Verbal cues, and Handouts Education comprehension: verbalized understanding, returned demonstration, and needs further education  HOME EXERCISE PROGRAM: Access Code: [email or web address] URL: [email or web address] Date: 01/21/2024 Prepared by: Ozell Silvan  Exercises - Supine Bridge with Resistance Band  - 1 x daily - 7 x weekly - 1 sets - 10 reps - Seated Long Arc Quad  - 1-2 x daily - 7 x weekly - 1-2 sets - 10 reps - 2 hold - Seated March  - 1-2 x daily - 7 x weekly - 1-2 sets - 10 reps - Seated Heel Toe Raises  - 1-2 x daily - 7 x weekly - 1-2 sets - 10 reps - Seated Hip Adduction Isometrics with Ball  - 1-2 x daily - 7 x weekly - 1-2 sets - 10 reps - 5 hold - Seated Hip Abduction with Resistance  - 1-2 x daily - 7 x weekly - 1-2 sets - 10-15 reps - Modified Thomas Stretch  - 2-3 x daily - 7 x weekly - 1 sets - 3-5 reps - 30 hold - Seated Gastroc Stretch with Strap  - 2-3 x daily - 7 x weekly - 1 sets - 3-5 reps - 30 hold  ASSESSMENT:  CLINICAL IMPRESSION: Similar presentation in movements for functional activity with cues required throughout to ensure proper placement and safety out of chair.   Continued focus on improving positioning in mobility to help put center of mass over feet for more stability.  Mild improvements at times in quality of movement control but continued challenges present.   OBJECTIVE IMPAIRMENTS: Abnormal gait, decreased activity tolerance, decreased balance, decreased coordination, decreased endurance, decreased mobility, difficulty walking, decreased ROM, decreased strength, hypomobility, increased fascial restrictions, impaired flexibility, improper body mechanics, postural dysfunction, and pain.   ACTIVITY LIMITATIONS: carrying, lifting, bending, standing, squatting, stairs,  transfers, bed mobility, bathing, toileting, dressing, hygiene/grooming, and locomotion level  PARTICIPATION LIMITATIONS: cleaning, interpersonal relationship, and community activity  PERSONAL FACTORS: Age, Fitness, Past/current experiences, Time since onset of injury/illness/exacerbation, and 1 comorbidity: R THA are also affecting patient's functional outcome.   REHAB POTENTIAL: Good  CLINICAL DECISION MAKING: Evolving/moderate  complexity  EVALUATION COMPLEXITY: Moderate   GOALS: Goals reviewed with patient? Yes  SHORT TERM GOALS: Target date: 3 weeks : 01/19/2024    Pt will be ind with initial HEP Baseline: Goal status: not met - cues still needed   LONG TERM GOALS: Target date: 10 weeks:  03/08/2024   Pt will be ind with management and progression of HEP Baseline:  Goal status:   on going 01/26/2024  2.  Pt will improve TUG < 30 seconds with FWW  to show improved gait speed, reduced fall risk.  Baseline:  Goal status:  on going 01/26/2024  3.  Pt will be able to amb at least 150' with RW modified independent for safe home amb Baseline:  Goal status: on going 01/26/2024  4.  Pt will have improved PSFS average score to > or = 5/10 on average to show reduced disability due to condition.  Baseline:  Goal status:  on going 01/26/2024  5.  Patient will demonstrate bilateral hip strength flexion 5/5, hip abduction 4/5 , knee extension and flexion 5/5 to facilitate progressive mobility improvements.  Baseline:  Goal status:  on going 01/26/2024    PLAN:  PT FREQUENCY: 2x/week  PT DURATION: 10 weeks  PLANNED INTERVENTIONS: 97164- PT Re-evaluation, 97750- Physical Performance Testing, 97110-Therapeutic exercises, 97530- Therapeutic activity, 97112- Neuromuscular re-education, 97535- Self Care, 02859- Manual therapy, [phone]- Gait training, [phone]- Aquatic Therapy, [phone]- Electrical stimulation (unattended), 97016- Vasopneumatic device, [id number]- Ultrasound, [id number]- Ionotophoresis 4mg /ml Dexamethasone , 79439 (1-2 muscles), [id number] (3+ muscles)- Dry Needling, Patient/Family education, Balance training, Stair training, Taping, Joint mobilization, Cryotherapy, and Moist heat  PLAN FOR NEXT SESSION:  Cues continued for safety and movement positioning.   Ozell Silvan, PT, DPT, OCS, ATC 02/09/24  12:59 PM

## 2024-02-11 ENCOUNTER — Ambulatory Visit (INDEPENDENT_AMBULATORY_CARE_PROVIDER_SITE_OTHER): Admitting: Rehabilitative and Restorative Service Providers"

## 2024-02-11 ENCOUNTER — Encounter: Payer: Self-pay | Admitting: Rehabilitative and Restorative Service Providers"

## 2024-02-11 DIAGNOSIS — M25551 Pain in right hip: Secondary | ICD-10-CM | POA: Diagnosis not present

## 2024-02-11 DIAGNOSIS — R296 Repeated falls: Secondary | ICD-10-CM

## 2024-02-11 DIAGNOSIS — M6281 Muscle weakness (generalized): Secondary | ICD-10-CM | POA: Diagnosis not present

## 2024-02-11 DIAGNOSIS — M25552 Pain in left hip: Secondary | ICD-10-CM

## 2024-02-11 DIAGNOSIS — M5459 Other low back pain: Secondary | ICD-10-CM

## 2024-02-11 DIAGNOSIS — R262 Difficulty in walking, not elsewhere classified: Secondary | ICD-10-CM

## 2024-02-29 NOTE — Progress Notes (Incomplete)
 Lee Nelson is a delightful 83 y.o. RH male with a history ofhypertension, hyperlipidemia, history of hospitalization in February 2024 for first COVID-pneumonia with severe sepsis hypoxic respiratory failure, history of NPH dating back to 2015, cervical spinal stenosis per [facility] note treated conservatively, history of TIA (with right amaurosis fugax, May 2019) on aspirin  daily,  presenting today in follow-up for evaluation of memory loss seen today in follow up for memory loss. Patient is currently on5/13/25 with MMSE 26/30.  This patient is accompanied in the office by *** who supplements the history.  Previous records as well as any outside records available were reviewed prior to todays visit. Patient was last seen on ***. Memory is **. MMSE today is  /30. Patient is able to participate on ADLs and continues to drive without difficulties. Mood is***    Assessment & Plan  Dementia with psychotic disturbance, concern for Alzheimer's disease  Patient is on a multitude of supplements and over-the-counter memory products, discussed with him initiating the prescribed memory medication i.e. donepezil*** Continue to control mood per PCP, the patient has been referred to psychiatry, and has yet to see Patient has known moderate ventriculomegaly, stable from recent CT of the head 11/09/2023.  He is yet to have an MRI of the brain as ordered.  Recommend proceeding with the imaging in order to further evaluate for NPH for any structural abnormalities and vascular load if symptomatic, will refer to interventional radiology. Should the lumbar puncture be considered in the future to further evaluate the NPH, would also be prudent to assess cerebrospinal fluid for Alzheimer's biomarkers. Continue physical therapy  Discussed the use of AI scribe software for clinical note transcription with the patient, who gave verbal consent to proceed.  History of Present Illness     Any changes in memory since last  visit?   I have dementia.  He has objective worsening of his memory, reporting that he has more trouble with remembering a recent conversation, but more difficulties to make a decision and if he is under stress, his memory may be worse.  He is quite worried about his memory and at 1 point, he may lose some concentration.  He was mistaking one of his sisters for his mother.  repeats oneself?   I need to. Some caregivers don't take notes on how I do things Disoriented when walking into a room?  Patient denies    Misplacing objects?  Patient denies   Wandering behavior?   denies   Any personality changes since last visit?   Periodically I have confusion.  Any worsening depression?: Sometimes I might feel depressed . Hallucinations or paranoia?  I don't think I see things. I might have had a break in into my house -unclear if he did Seizures?   denies    Any sleep changes? I stay up to late and sometimes it may affect my sleep. Sometimes he as vivid dreams, has possible RLS. Denies  or sleepwalking   Sleep apnea?   Denies.  Any hygiene concerns?   Aide helps him. Independent of bathing and dressing?  Endorsed  Does the patient needs help with medications? Aide is in charge   Who is in charge of the finances?  Patient is in charge     Any changes in appetite?  Denies.    Patient have trouble swallowing?  Chronic, he is on a soft diet.   Does the patient cook?  Any kitchen accidents such as leaving the stove on?  denies   Any headaches?    denies   Vision changes? denies Chronic pain?  Chronic lumbar pain. Ambulates with difficulty?  Uses a walker for stability. Does PT     Recent falls or head injuries?  He fell from his high bed hitting R  parietal area, no LOC,  He caught himself so the fall was not severe. He denies nausea, dizziness  Unilateral weakness, numbness or tingling?   Denies.   Any tremors?  Denies.   Any anosmia?    Denies.   Any incontinence of urine?  Endorsed,  wears pads. Any bowel dysfunction?  Denies.      Patient lives with his wife and aide.     Does the patient drive? No longer drives        Initial visit 07/2022  The patient is seen in neurologic consultation at the request of Avva, Ravisankar, MD for the evaluation of memory.  Lee Nelson is a very delightful 83 y.o. year old RH male retired pensions consultant and Social Worker Professor at [employer], with a history of hypertension, hyperlipidemia, history of hospitalization in February 2024 for first COVID-pneumonia with severe sepsis hypoxic respiratory failure, history of NPH dating back to 2015, initially followed by [facility],  history of mild cognitive impairment diagnosed by [facility] in 2018, last seen in September 2022 and also seen at [facility], history of gait difficulty likely due to deconditioning, weight loss, cervical spinal stenosis, hip pain and arthritis per [facility] note treated conservatively, history of TIA (with right amaurosis fugax, May 2019) on aspirin  daily, history of migraine with aura in the past followed by PCP, seen today for evaluation of memory loss. MRI brain 02/2022 personally reviewed, was remarkable for advanced atrophy and chronic SVD. Ventricles are enlarged in the setting of atrophy. He needs assistance with ADLs and needs a walker to ambulate due to pain, but on exam he does not show any other signs of NPH (other than memory issues) such as magnetic feet excessive urination, tremors. Patient takes many natural products, including [place]'s Mane, Neuriva and other OTC brain supplements, multivitamins etc..In view of advanced age, MRI findings, no recommendations for ACHI or NMDI is indicated and patient agrees.        How long did patient have memory difficulties? He began having some memory issues in 2015. Lately was seen at [facility] diagnosed with Mild Dementia, no meds were recommended. Sometimes Patient has some difficulty remembering recent conversations and people names repeats oneself?  Endorsed  by nature.  Disoriented when walking into a room?  Patient denies Leaving objects in unusual places?   denies   Wandering behavior? denies   Any personality changes since last visit? denies   Any history of depression?:  Im not sure what the concept is everyone now and then has it. Not taking medicine for it.  Hallucinations or paranoia?  denies   Seizures? denies    Any sleep changes?  I don't sleep very well. Denies  vivid dreams, REM behavior or sleepwalking   Sleep apnea? denies   Any hygiene concerns?  Denies but some people mentioned that I have not taken a shower in a few days   Independent of bathing and dressing?  Endorsed Needs some assistance with dressing up. Does the patient need help with medications? Patient is in charge   Who is in charge of the finances?  Wife is in charge of hers and I am in charge of mine   Any changes in appetite?  denies     Trouble swallowing? He is on a soft diet    Any kitchen accidents such as leaving the stove on? Patient denies   Any headaches?  denies   Chronic back pain?  Endorsed, lumbar pain.  Ambulates with difficulty? Uses a walker on a daily basis to prevent falls.  Recent falls or head injuries?hip injury 10/2023 ***  with head trauma, No LOC, negative imaging *** No recent head injury  No recent vertigo or dizziness  Vision changes? Denies  Unilateral weakness, numbness or tingling?  denies   Any tremors?  denies   Any anosmia?  denies   Any incontinence of urine? I don't think so, but sometimes I have urge problems, I wear a pad. Any bowel dysfunction?  Recnet constipation  Patient lives wife and aide.  Wife has incomplete quadriplegia    History of heavy alcohol  intake? denies   History of heavy tobacco use? denies   Family history of dementia?  Not that I know of. Brother had PD dementia  Does patient drive? Not anymore.      CT of the head without contrast 11/09/2023, personally reviewed remarkable for cerebral  atrophy, hippocampal atrophy, and stable moderate ventriculomegaly without abnormal mass effect or midline shift.  No acute intracranial hemorrhage, or infarct.  Unremarkable cerebellum.  No skull fracture  Neuropsych evaluation, Dr. Gayland 12/22/23 Briefly, results  indicated cognitive performance that was largely below expectations, with only a few isolated areas of preserved functioning. This global decline is especially notable given his presumed high cognitive baseline. At this time, he also requires assistance with all instrumental activities of daily living. Given evidence of both cognitive and functional decline, findings support a diagnosis of dementia. Etiology of his cognitive impairment is somewhat unclear. Neuroimaging indicated ventriculomegaly, and clinically he exhibits the classic triad associated with NPH. However, it remains unclear from available records whether he has ever received a formal NPH diagnosis or undergone a high-volume lumbar puncture. Given the global nature of his cognitive decline, along with pertinent background information, there is also concern for a possible underlying neurodegenerative process, such as Alzheimer's disease. Other contributing factors to the overall clinical picture may include mood disturbance, disrupted sleep, and the use of numerous over-the-counter supplements, the latter of which is concerning due to the unknown potential effects and interactions on his overall health and cognition.       08/11/2023    6:00 PM 02/09/2023   12:00 PM 12/11/2020    9:02 AM  MMSE - Mini Mental State Exam  Orientation to time 3 4 5   Orientation to Place 5 5 5   Registration 3 3 3   Attention/ Calculation 4 3 4   Recall 3 3 3   Language- name 2 objects 2 2 2   Language- repeat 1 1 1   Language- follow 3 step command 3 3 3   Language- read & follow direction 1 1 1   Write a sentence 1 1 1   Copy design 0 0 1  Total score 26 26 29       08/06/2022   12:00 PM  [facility]  Cognitive Assessment   Visuospatial/ Executive (0/5) 2  Naming (0/3) 3  Attention: Read list of digits (0/2) 2  Attention: Read list of letters (0/1) 1  Attention: Serial 7 subtraction starting at 100 (0/3) 3  Language: Repeat phrase (0/2) 2  Language : Fluency (0/1) 1  Abstraction (0/2) 2  Delayed Recall (0/5) 4  Orientation (0/6) 5  Total 25  Adjusted Score (  based on education) 25    Results     Objective:    Neurological Exam:    VITALS:  There were no vitals filed for this visit.  GEN:  The patient appears stated age and is in NAD. HEENT:  Normocephalic, atraumatic.   Neurological examination:  General: NAD, well-groomed, appears stated age. Orientation: The patient is alert. Oriented to person, place and date Cranial nerves: There is good facial symmetry.The speech is fluent and clear, tangential. No aphasia or dysarthria. Fund of knowledge is appropriate. Recent and remote memory are impaired. Attention and concentration are reduced. Able to name objects and repeat phrases.  Hearing is intact to conversational tone. *** Sensation: Sensation is intact to light touch throughout Motor: Strength is at least antigravity x4. DTR's 2/4 in UE/LE     Movement examination:  Tone: There is normal tone in the UE/LE Abnormal movements:  no tremor.  No myoclonus.  No asterixis.   Coordination:  There is no decremation with RAM's. Normal finger to nose  Gait and Station: The patient has no*** difficulty arising out of a deep-seated chair without the use of the hands. The patient's stride length is short, with intermittent shuffling.  Gait is cautious and narrow.    Thank you for allowing us  the opportunity to participate in the care of this nice patient. Please do not hesitate to contact us  for any questions or concerns.   Total time spent on today's visit was *** minutes dedicated to this patient today, preparing to see patient, examining the patient, ordering tests and/or  medications and counseling the patient, documenting clinical information in the EHR or other health record, independently interpreting results and communicating results to the patient/family, discussing treatment and goals, answering patient's questions and coordinating care.  Cc:  Avva, Ravisankar, MD  Camie Sevin 02/29/2024 5:28 AM

## 2024-03-01 ENCOUNTER — Ambulatory Visit: Admitting: Physician Assistant

## 2024-03-02 ENCOUNTER — Ambulatory Visit: Admitting: Physician Assistant

## 2024-03-02 ENCOUNTER — Encounter: Payer: Self-pay | Admitting: Physician Assistant

## 2024-03-02 VITALS — Resp 20 | Ht 70.5 in

## 2024-03-02 DIAGNOSIS — G912 (Idiopathic) normal pressure hydrocephalus: Secondary | ICD-10-CM

## 2024-03-02 DIAGNOSIS — F322 Major depressive disorder, single episode, severe without psychotic features: Secondary | ICD-10-CM

## 2024-03-02 DIAGNOSIS — F0392 Unspecified dementia, unspecified severity, with psychotic disturbance: Secondary | ICD-10-CM | POA: Diagnosis not present

## 2024-03-02 NOTE — Patient Instructions (Addendum)
 It was a pleasure to see you today at our office.   Recommendations:   Follow up March 3 at 11:30   Referral to interventional radiology for NPH  Continue physical therapy  Referral to psychiatry       For assessment of decision of mental capacity and competency:  Call Dr. Rosaline Nine, geriatric psychiatrist at [phone] Counseling regarding caregiver distress, including caregiver depression, anxiety and issues regarding community resources, adult day care programs, adult living facilities, or memory care questions:  please contact your  Primary Doctor's Social Worker  Whom to call:  Memory  decline, memory medications: Call our office [phone]  For psychiatric meds, mood meds: Please have your primary care physician manage these medications.  If you have any severe symptoms of a stroke, or other severe issues such as confusion,severe chills or fever, etc call 911 or go to the ER as you may need to be evaluated further    RECOMMENDATIONS FOR ALL PATIENTS WITH MEMORY PROBLEMS: 1. Continue to exercise (Recommend 30 minutes of walking everyday, or 3 hours every week) 2. Increase social interactions - continue going to [place] and enjoy social gatherings with friends and family 3. Eat healthy, avoid fried foods and eat more fruits and vegetables 4. Maintain adequate blood pressure, blood sugar, and blood cholesterol level. Reducing the risk of stroke and cardiovascular disease also helps promoting better memory. 5. Avoid stressful situations. Live a simple life and avoid aggravations. Organize your time and prepare for the next day in anticipation. 6. Sleep well, avoid any interruptions of sleep and avoid any distractions in the bedroom that may interfere with adequate sleep quality 7. Avoid sugar, avoid sweets as there is a strong link between excessive sugar intake, diabetes, and cognitive impairment We discussed the Mediterranean diet, which has been shown to help patients  reduce the risk of progressive memory disorders and reduces cardiovascular risk. This includes eating fish, eat fruits and green leafy vegetables, nuts like almonds and hazelnuts, walnuts, and also use olive oil. Avoid fast foods and fried foods as much as possible. Avoid sweets and sugar as sugar use has been linked to worsening of memory function.  There is always a concern of gradual progression of memory problems. If this is the case, then we may need to adjust level of care according to patient needs. Support, both to the patient and caregiver, should then be put into place.     FALL PRECAUTIONS: Be cautious when walking. Scan the area for obstacles that may increase the risk of trips and falls. When getting up in the mornings, sit up at the edge of the bed for a few minutes before getting out of bed. Consider elevating the bed at the head end to avoid drop of blood pressure when getting up. Walk always in a well-lit room (use night lights in the walls). Avoid area rugs or power cords from appliances in the middle of the walkways. Use a walker or a cane if necessary and consider physical therapy for balance exercise. Get your eyesight checked regularly.  FINANCIAL OVERSIGHT: Supervision, especially oversight when making financial decisions or transactions is also recommended.  HOME SAFETY: Consider the safety of the kitchen when operating appliances like stoves, microwave oven, and blender. Consider having supervision and share cooking responsibilities until no longer able to participate in those. Accidents with firearms and other hazards in the house should be identified and addressed as well.   ABILITY TO BE LEFT ALONE: If patient is unable  to contact 911 operator, consider using LifeLine, or when the need is there, arrange for someone to stay with patients. Smoking is a fire hazard, consider supervision or cessation. Risk of wandering should be assessed by caregiver and if detected at any point,  supervision and safe proof recommendations should be instituted.  MEDICATION SUPERVISION: Inability to self-administer medication needs to be constantly addressed. Implement a mechanism to ensure safe administration of the medications.      Mediterranean Diet A Mediterranean diet refers to food and lifestyle choices that are based on the traditions of countries located on the [place]. This way of eating has been shown to help prevent certain conditions and improve outcomes for people who have chronic diseases, like kidney disease and heart disease. What are tips for following this plan? Lifestyle  Cook and eat meals together with your family, when possible. Drink enough fluid to keep your urine clear or pale yellow. Be physically active every day. This includes: Aerobic exercise like running or swimming. Leisure activities like gardening, walking, or housework. Get 7-8 hours of sleep each night. If recommended by your health care provider, drink red wine in moderation. This means 1 glass a day for nonpregnant women and 2 glasses a day for men. A glass of wine equals 5 oz (150 mL). Reading food labels  Check the serving size of packaged foods. For foods such as rice and pasta, the serving size refers to the amount of cooked product, not dry. Check the total fat in packaged foods. Avoid foods that have saturated fat or trans fats. Check the ingredients list for added sugars, such as corn syrup. Shopping  At the grocery store, buy most of your food from the areas near the walls of the store. This includes: Fresh fruits and vegetables (produce). Grains, beans, nuts, and seeds. Some of these may be available in unpackaged forms or large amounts (in bulk). Fresh seafood. Poultry and eggs. Low-fat dairy products. Buy whole ingredients instead of prepackaged foods. Buy fresh fruits and vegetables in-season from local farmers markets. Buy frozen fruits and vegetables in resealable  bags. If you do not have access to quality fresh seafood, buy precooked frozen shrimp or canned fish, such as tuna, salmon, or sardines. Buy small amounts of raw or cooked vegetables, salads, or olives from the deli or salad bar at your store. Stock your pantry so you always have certain foods on hand, such as olive oil, canned tuna, canned tomatoes, rice, pasta, and beans. Cooking  Cook foods with extra-virgin olive oil instead of using butter or other vegetable oils. Have meat as a side dish, and have vegetables or grains as your main dish. This means having meat in small portions or adding small amounts of meat to foods like pasta or stew. Use beans or vegetables instead of meat in common dishes like chili or lasagna. Experiment with different cooking methods. Try roasting or broiling vegetables instead of steaming or sauteing them. Add frozen vegetables to soups, stews, pasta, or rice. Add nuts or seeds for added healthy fat at each meal. You can add these to yogurt, salads, or vegetable dishes. Marinate fish or vegetables using olive oil, lemon juice, garlic, and fresh herbs. Meal planning  Plan to eat 1 vegetarian meal one day each week. Try to work up to 2 vegetarian meals, if possible. Eat seafood 2 or more times a week. Have healthy snacks readily available, such as: Vegetable sticks with hummus. Greek yogurt. Fruit and nut trail mix. Eat  balanced meals throughout the week. This includes: Fruit: 2-3 servings a day Vegetables: 4-5 servings a day Low-fat dairy: 2 servings a day Fish, poultry, or lean meat: 1 serving a day Beans and legumes: 2 or more servings a week Nuts and seeds: 1-2 servings a day Whole grains: 6-8 servings a day Extra-virgin olive oil: 3-4 servings a day Limit red meat and sweets to only a few servings a month What are my food choices? Mediterranean diet Recommended Grains: Whole-grain pasta. Brown rice. Bulgar wheat. Polenta. Couscous. Whole-wheat bread.  [place]. Vegetables: Artichokes. Beets. Broccoli. Cabbage. Carrots. Eggplant. Green beans. Chard. Kale. Spinach. Onions. Leeks. Peas. Squash. Tomatoes. Peppers. Radishes. Fruits: Apples. Apricots. Avocado. Berries. Bananas. Cherries. Dates. Figs. Grapes. Lemons. Melon. Oranges. Peaches. Plums. Pomegranate. Meats and other protein foods: Beans. Almonds. Sunflower seeds. Pine nuts. Peanuts. Cod. Salmon. Scallops. Shrimp. Tuna. Tilapia. Clams. Oysters. Eggs. Dairy: Low-fat milk. Cheese. Greek yogurt. Beverages: Water . Red wine. Herbal tea. Fats and oils: Extra virgin olive oil. Avocado oil. Grape seed oil. Sweets and desserts: Greek yogurt with honey. Baked apples. Poached pears. Trail mix. Seasoning and other foods: Basil. Cilantro. Coriander. Cumin. Mint. Parsley. Sage. Rosemary. Tarragon. Garlic. Oregano. Thyme. Pepper. Balsalmic vinegar. Tahini. Hummus. Tomato sauce. Olives. Mushrooms. Limit these Grains: Prepackaged pasta or rice dishes. Prepackaged cereal with added sugar. Vegetables: Deep fried potatoes (french fries). Fruits: Fruit canned in syrup. Meats and other protein foods: Beef. Pork. Lamb. Poultry with skin. Hot dogs. [place]. Dairy: Ice cream. Sour cream. Whole milk. Beverages: Juice. Sugar-sweetened soft drinks. Beer. Liquor and spirits. Fats and oils: Butter. Canola oil. Vegetable oil. Beef fat (tallow). Lard. Sweets and desserts: Cookies. Cakes. Pies. Candy. Seasoning and other foods: Mayonnaise. Premade sauces and marinades. The items listed may not be a complete list. Talk with your dietitian about what dietary choices are right for you. Summary The Mediterranean diet includes both food and lifestyle choices. Eat a variety of fresh fruits and vegetables, beans, nuts, seeds, and whole grains. Limit the amount of red meat and sweets that you eat. Talk with your health care provider about whether it is safe for you to drink red wine in moderation. This means 1 glass a day  for nonpregnant women and 2 glasses a day for men. A glass of wine equals 5 oz (150 mL). This information is not intended to replace advice given to you by your health care provider. Make sure you discuss any questions you have with your health care provider. Document Released: 11/08/2015 Document Revised: 12/11/2015 Document Reviewed: 11/08/2015 Elsevier Interactive Patient Education  2017 [employer].

## 2024-03-11 ENCOUNTER — Ambulatory Visit: Admitting: Neurosurgery

## 2024-03-11 ENCOUNTER — Encounter: Payer: Self-pay | Admitting: Neurosurgery

## 2024-03-11 VITALS — BP 147/72 | HR 68 | Temp 97.4°F | Ht 70.5 in | Wt 122.6 lb

## 2024-03-11 DIAGNOSIS — R4189 Other symptoms and signs involving cognitive functions and awareness: Secondary | ICD-10-CM

## 2024-03-11 NOTE — Progress Notes (Unsigned)
 Assessment : 83 year old male with past medical history significant for dementia, PSVT, GERD, not on anticoagulation presenting for neurosurgical evaluation to determine wether CSF fluid removal would be medically indicated to improve cognitive symptoms.   The history was difficult to ascertain due to the patients confusion, tangential responses, poor recollection of events, and reluctance to allow his aide to assist with history. The following information was confirmed by the patient's aide.   The patient is unsure as to when he was first diagnosed or received imaging. He believes It was sometime about 4 years ago where a [place] CT reportedly noted potential NPH.  In September 2025 he underwent formal neuropsychological evaluation performed by [facility], which confirmed diagnosis of dementia. A CT head on 11/09/23 demonstrated hippocampal atrophy and stable ventriculomegaly without evidence of acute hemorrhage or prior stroke.   On 12/3the patient was seen by [facility] for a follow up of memory and cognitive decline. At the visit, he was informed that he has dementia of unclear etiology. Neurology referred him for further evaluation to assess whether he may be a candidate for CSF removal and whether such an intervention would be medically appropriate to potentially benefit his memory symptoms.   Plan : I had the pleasure of examining him and talking to him as well as his aide.  He can barely remember that he had conversations a decade ago about evaluation for normal pressure hydrocephalus.  He remembers the neurologist and can be very articulate about this.  During this time, his emotional response is very adequate and he expresses joy and despair as well as displeasure.  These are typically not the attributes of somebody with normal pressure hydrocephalus.  I explicitly explained to him and his aide that the workup for this includes a new lumbar puncture with a pre and post  lumbar puncture evaluation by physical therapy and a cognitive evaluation.  It is very important that the person who is with him sees him the day before as well as the days after and to relay to us  the changes that they noticed it, if any.  He is in agreement with this and we will get the lumbar puncture done.  My suspicion is that he does not have normal pressure hydrocephalus.  His engagement with me today was very appropriate and although he may have some memory loss as an 83 year old, I am not entirely sure that this fits within the framework of normal pressure hydrocephalus but I may be surprised after the lumbar puncture.   Social History   Socioeconomic History   Marital status: Married    Spouse name: Barnie   Number of children: 1   Years of education: BA, MA, JD   Highest education level: Not on file  Occupational History   Occupation: teaches constitutional law   Occupation: Photographer: [employer]  Tobacco Use   Smoking status: Never   Smokeless tobacco: Never  Vaping Use   Vaping status: Never Used  Substance and Sexual Activity   Alcohol  use: Not Currently    Comment: social   Drug use: No   Sexual activity: Not Currently  Other Topics Concern   Not on file  Social History Narrative   Patient lives at a nursing faciltiy now   Daily caffeine use   Right handed   Social Drivers of Health   Tobacco Use: Low Risk (03/11/2024)   Patient History    Smoking Tobacco Use:  Never    Smokeless Tobacco Use: Never    Passive Exposure: Not on file  Financial Resource Strain: Not on file  Food Insecurity: No Food Insecurity (09/07/2022)   Hunger Vital Sign    Worried About Running Out of Food in the Last Year: Never true    Ran Out of Food in the Last Year: Never true  Transportation Needs: No Transportation Needs (09/07/2022)   PRAPARE - Administrator, Civil Service (Medical): No    Lack of Transportation (Non-Medical): No  Physical  Activity: Not on file  Stress: Not on file  Social Connections: Not on file  Intimate Partner Violence: Not At Risk (09/07/2022)   Humiliation, Afraid, Rape, and Kick questionnaire    Fear of Current or Ex-Partner: No    Emotionally Abused: No    Physically Abused: No    Sexually Abused: No  Depression (PHQ2-9): Not on file  Alcohol  Screen: Not on file  Housing: Low Risk (09/07/2022)   Housing    Last Housing Risk Score: 0  Utilities: Not At Risk (09/07/2022)   AHC Utilities    Threatened with loss of utilities: No  Health Literacy: Not on file    Family History  Problem Relation Age of Onset   Parkinsonism Brother    COPD Mother    Allergies Mother    Heart failure Mother    COPD Father    Stroke Father    Other Sister    Suicidality Maternal Aunt    Cancer Maternal Grandfather     Allergies[1]  Past Medical History:  Diagnosis Date   Anxiety    Arthritis    Bladder calculus    BPH (benign prostatic hyperplasia)    Chronic constipation    Complication of anesthesia     I had some coughing afterwards for a couple of days--  per pt perfers spinal anesthesia since general anesthesia congnitive issues when older   Diverticulosis of colon    Dry eye syndrome of both eyes    Environmental allergies    GERD (gastroesophageal reflux disease)    occasional   History of adenomatous polyp of colon    08/ 2004   History of kidney stones    History of squamous cell carcinoma in situ (SCCIS) of skin    s/p  excision 2013 facial areas and 06/ 2016 nose   Migraine    eye migraine occasional   Seasonal and perennial allergic rhinitis    Thrombocytopenia    Tingling    hands and feet bilat , intermittantly-- per pt has lumbar bulging disk   Urinary frequency    Vocal fold atrophy    dysphonia-- per pt has to drink large amount of water  to take even on pill   Wears glasses     Past Surgical History:  Procedure Laterality Date   APPENDECTOMY  child   CARDIOVASCULAR  STRESS TEST  05-17-2015  dr hilty   Low risk nuclear study w/ no ischemia/  normal LV function and wall motion , stress ef 54% (lvef 45-54%)   CHOLECYSTECTOMY N/A 09/29/2014   Procedure: LAPAROSCOPIC CHOLECYSTECTOMY WITH INTRAOPERATIVE CHOLANGIOGRAM;  Surgeon: Alm Angle, MD;  Location: [facility];  Service: General;  Laterality: N/A;   COLONOSCOPY  last one 09-06-2010   ESOPHAGOGASTRODUODENOSCOPY N/A 12/09/2013   Procedure: ESOPHAGOGASTRODUODENOSCOPY (EGD);  Surgeon: Elsie Cree, MD;  Location: [facility];  Service: Endoscopy;  Laterality: N/A;   EXTRACORPOREAL SHOCK WAVE LITHOTRIPSY  yrs ago   INGUINAL HERNIA  REPAIR Left child   inguinal hernia repair  09/2017   NISSEN FUNDOPLICATION  1980's   open   STONE EXTRACTION WITH BASKET N/A 08/18/2016   Procedure: STONE EXTRACTION WITH BASKET;  Surgeon: Chales Idol, MD;  Location: [facility];  Service: Urology;  Laterality: N/A;   THULIUM LASER TURP (TRANSURETHRAL RESECTION OF PROSTATE) N/A 08/18/2016   Procedure: THULIUM LASER BLADDER NECK INCISION AND BLADDER STONE REMOVAL;  Surgeon: Chales Idol, MD;  Location: [facility];  Service: Urology;  Laterality: N/A;   TONSILLECTOMY  child   TOTAL HIP ARTHROPLASTY Right 09/07/2022   Procedure: TOTAL HIP ARTHROPLASTY ANTERIOR APPROACH;  Surgeon: Vernetta Lonni GRADE, MD;  Location: [facility];  Service: Orthopedics;  Laterality: Right;   TRANSTHORACIC ECHOCARDIOGRAM  11/18/2010   grade 1 diastolic dysfunction, ef 55-60%/  trivial MR and TR/ mild dilated RA     Physical Exam   Physical Exam HENT:     Head: Normocephalic.     Nose: Nose normal.  Eyes:     Pupils: Pupils are equal, round, and reactive to light.  Cardiovascular:     Rate and Rhythm: Normal rate.  Pulmonary:     Effort: Pulmonary effort is normal.  Abdominal:     General: Abdomen is flat.  Musculoskeletal:     Cervical back: Normal range of motion.  Neurological:     Mental Status:  Patient is alert.     Cranial Nerves: Cranial nerves 2-12 are intact.     Sensory: Sensation is intact.     Motor: Motor function is intact.     Coordination: Coordination is intact.     Results for orders placed or performed during the hospital encounter of 11/09/23  CT HEAD WO CONTRAST ( )   Narrative   CLINICAL DATA:  Head trauma, minor (Age >= 65y). Recent fall, dementia  EXAM: CT HEAD WITHOUT CONTRAST  TECHNIQUE: Contiguous axial images were obtained from the base of the skull through the vertex without intravenous contrast.  RADIATION DOSE REDUCTION: This exam was performed according to the departmental dose-optimization program which includes automated exposure control, adjustment of the mA and/or kV according to patient size and/or use of iterative reconstruction technique.  COMPARISON:  08/14/2023  FINDINGS: Brain: Normal anatomic configuration. Parenchymal volume loss is commensurate with the patient's age. Mild periventricular white matter changes are present likely reflecting the sequela of small vessel ischemia. Moderate ventriculomegaly is stable since remote prior MRI examination 03/08/2022. No abnormal intra or extra-axial mass lesion or fluid collection. No abnormal mass effect or midline shift. No evidence of acute intracranial hemorrhage or infarct. Cerebellum unremarkable.  Vascular: No asymmetric hyperdense vasculature at the skull base.  Skull: Intact  Sinuses/Orbits: Small mucous retention cyst or polyp within the right sphenoid sinus. Remaining paranasal sinuses are clear. Orbits are unremarkable.  Other: Mastoid air cells and middle ear cavities are clear.  IMPRESSION: 1. No acute intracranial abnormality. No calvarial fracture. 2. Stable senescent change. 3. Stable moderate ventriculomegaly.   Electronically Signed   By: Dorethia Molt M.D.   On: 11/09/2023 03:57   Results for orders placed or performed during the hospital  encounter of 03/08/22  MR BRAIN WO CONTRAST   Narrative   CLINICAL DATA:  . Mild dementia without behavioral disturbance, psychotic disturbance, mood disturbance, or anxiety, unspecified dementia type  EXAM: MRI HEAD WITHOUT CONTRAST  TECHNIQUE: Multiplanar, multiecho pulse sequences of the brain and surrounding structures were obtained without intravenous contrast.  COMPARISON:  11/11/2016  FINDINGS: Brain: No acute infarct, mass effect or extra-axial collection. No acute or chronic hemorrhage. There is multifocal hyperintense T2-weighted signal within the white matter. There is advanced atrophy. Degree of volume loss is approximately unchanged. The midline structures are normal.  Vascular: Major flow voids are preserved.  Skull and upper cervical spine: Normal calvarium and skull base. Visualized upper cervical spine and soft tissues are normal.  Sinuses/Orbits:No paranasal sinus fluid levels or advanced mucosal thickening. No mastoid or middle ear effusion. Normal orbits.  IMPRESSION: 1. No acute intracranial abnormality. 2. Advanced atrophy and findings of chronic small vessel disease.   Electronically Signed   By: Franky Stanford M.D.   On: 03/10/2022 21:17   Results for orders placed or performed during the hospital encounter of 11/11/16  MR BRAIN W WO CONTRAST   Narrative   CLINICAL DATA:  Dizziness for 2 months, headaches. History of migraines.  EXAM: MRI HEAD WITHOUT AND WITH CONTRAST  TECHNIQUE: Multiplanar, multiecho pulse sequences of the brain and surrounding structures were obtained without and with intravenous contrast.  CONTRAST:  10mL MULTIHANCE  GADOBENATE DIMEGLUMINE  529 MG/ML IV SOLN  COMPARISON:  MRI of the head September 16, 2013  FINDINGS: INTRACRANIAL CONTENTS: Moderate to severe ventriculomegaly, slightly increased ballooning of the atria and occipital horns. No reduced diffusion to suggest acute ischemia. No susceptibility artifact  to suggest hemorrhage. No abnormal parenchymal signal, mass or mass effect. No abnormal intraparenchymal or extra-axial enhancement.  VASCULAR: Normal major intracranial vascular flow voids present at skull base.  SKULL AND UPPER CERVICAL SPINE: No abnormal sellar expansion. No suspicious calvarial bone marrow signal. Craniocervical junction maintained.  SINUSES/ORBITS: RIGHT sphenoid mucosal retention cyst. No paranasal sinus air-fluid levels. Mastoid air cells are well aerated.The included ocular globes and orbital contents are non-suspicious.  OTHER: None.  IMPRESSION: 1. No acute intracranial process. 2. Progressed nonspecific moderate to severe parenchymal brain volume loss for age. No hydrocephalus.   Electronically Signed   By: Minerva Salle M.D.   On: 11/12/2016 05:29        [1]  Allergies Allergen Reactions   Ciprofloxacin     JOINT PAIN   Flagyl [Metronidazole] Other (See Comments)    REACTION: no appetite, diarrhea after meal, decrease in weight   Metoclopramide  Hcl Other (See Comments)    REACTION: involuntary movements   Other Other (See Comments)    Antibiotics have unknown reaction propophol causes memory problems   Propofol  Other (See Comments)    Other Reaction(s): Other (See Comments)  Memory problems   Risperidone Other (See Comments)    do not want   Silodosin     ? Possibly allergy, could not breathe well out of nose   Soy Allergy (Obsolete) Other (See Comments)    Stomach upset, gas  Other Reaction(s): Other (See Comments)   Sulfamethoxazole-Trimethoprim Other (See Comments)    REACTION: involuntary movements  tripac antibiotic- heart rythm  Other Reaction(s): Other (See Comments)  Unknown reaction

## 2024-03-14 ENCOUNTER — Other Ambulatory Visit: Payer: Self-pay

## 2024-03-14 DIAGNOSIS — F09 Unspecified mental disorder due to known physiological condition: Secondary | ICD-10-CM

## 2024-03-16 ENCOUNTER — Other Ambulatory Visit: Payer: Self-pay

## 2024-03-16 NOTE — Addendum Note (Signed)
 Addended by: Eura Radabaugh on: 03/16/2024 03:33 PM   Modules accepted: Orders

## 2024-03-16 NOTE — Addendum Note (Signed)
 Addended by: Montrice Montuori on: 03/16/2024 03:15 PM   Modules accepted: Orders

## 2024-03-22 ENCOUNTER — Other Ambulatory Visit: Payer: Self-pay

## 2024-03-22 ENCOUNTER — Other Ambulatory Visit (HOSPITAL_COMMUNITY)

## 2024-03-22 ENCOUNTER — Ambulatory Visit (HOSPITAL_COMMUNITY)
Admission: RE | Admit: 2024-03-22 | Discharge: 2024-03-22 | Disposition: A | Discharge status: Home / Self Care | Source: Ambulatory Visit | Attending: Internal Medicine | Admitting: Internal Medicine

## 2024-03-22 ENCOUNTER — Ambulatory Visit (HOSPITAL_COMMUNITY)
Admission: RE | Admit: 2024-03-22 | Discharge: 2024-03-22 | Disposition: A | Discharge status: Home / Self Care | Source: Ambulatory Visit | Attending: Neurosurgery | Admitting: Neurosurgery

## 2024-03-22 DIAGNOSIS — R4189 Other symptoms and signs involving cognitive functions and awareness: Secondary | ICD-10-CM | POA: Diagnosis present

## 2024-03-22 DIAGNOSIS — F09 Unspecified mental disorder due to known physiological condition: Secondary | ICD-10-CM

## 2024-03-22 MED ORDER — ACETAMINOPHEN 325 MG PO TABS: 650.0000 mg | ORAL_TABLET | ORAL | Status: DC | PRN

## 2024-03-22 NOTE — Progress Notes (Signed)
 Pt and caregiver received discharge instructions, teach back performed. Back biopsy site is clean dry intact. Site is soft, no signs of bleeding. Pt escorted out via wheelchair to caregiver's vehicle.

## 2024-03-22 NOTE — Procedures (Signed)
 PROCEDURE SUMMARY:  Successful fluoroscopic guided lumbar puncture. No immediate complications.  Pt tolerated well.   EBL = none  Please see full dictation in imaging section of Epic for procedure details.

## 2024-03-28 LAB — MISC LABCORP TEST (SEND OUT)
Labcorp test code: 9985
Labcorp test code: 9985

## 2024-03-29 ENCOUNTER — Telehealth: Payer: Self-pay | Admitting: Physician Assistant

## 2024-03-29 DIAGNOSIS — R2681 Unsteadiness on feet: Secondary | ICD-10-CM

## 2024-03-29 NOTE — Telephone Encounter (Signed)
 Pt's Care Lee Nelson called in today and she stated that they have taken Pt off all the supplements, It is going on the 3rd week since Pt has been off of them. Thanks

## 2024-04-01 LAB — MISC LABCORP TEST (SEND OUT): Labcorp test code: 9985

## 2024-04-01 NOTE — Telephone Encounter (Signed)
I placed order for physical therapy 

## 2024-04-01 NOTE — Telephone Encounter (Signed)
 I advised that order was placed, patient thanked me for calling.

## 2024-04-01 NOTE — Telephone Encounter (Signed)
 Patient has been off of the supplements for 3 weeks and they would like to start PT. The patient wife wanted to know if we could order PT  please call patient wife

## 2024-04-07 ENCOUNTER — Encounter: Payer: Self-pay | Admitting: Neurosurgery

## 2024-04-07 ENCOUNTER — Ambulatory Visit: Admitting: Neurosurgery

## 2024-04-07 VITALS — BP 126/76 | HR 67 | Temp 98.7°F | Ht 70.5 in

## 2024-04-07 DIAGNOSIS — G3184 Mild cognitive impairment, so stated: Secondary | ICD-10-CM

## 2024-04-07 DIAGNOSIS — G9389 Other specified disorders of brain: Secondary | ICD-10-CM

## 2024-04-07 DIAGNOSIS — F09 Unspecified mental disorder due to known physiological condition: Secondary | ICD-10-CM

## 2024-04-07 LAB — MISC LABCORP TEST (SEND OUT): Labcorp test code: 9985

## 2024-04-10 NOTE — Progress Notes (Signed)
 84 yo gentleman with cognitive impairment and ventriculomegaly.  He had an LP which showed an OP of 5cm. Neither the PT evaluation nor the caregiver observation suggested an improvement.  I shared with him that there is no surgical solution for his cognitive decline and that I suspect it may be age related.  Unfortunately, I do not think there is anything I can do for him. He is welcome to come back and see me if there is.

## 2024-04-13 ENCOUNTER — Telehealth: Payer: Self-pay

## 2024-04-13 NOTE — Telephone Encounter (Signed)
 Lumbar guilded  procedure done on 1223/2025 please advise on report, thanks

## 2024-04-14 NOTE — Telephone Encounter (Signed)
 Called [facility] to sent over

## 2024-04-15 ENCOUNTER — Ambulatory Visit

## 2024-04-18 ENCOUNTER — Ambulatory Visit: Attending: Physician Assistant | Admitting: Physical Therapy

## 2024-04-18 ENCOUNTER — Encounter: Payer: Self-pay | Admitting: Physical Therapy

## 2024-04-18 ENCOUNTER — Other Ambulatory Visit: Payer: Self-pay

## 2024-04-18 DIAGNOSIS — R2689 Other abnormalities of gait and mobility: Secondary | ICD-10-CM | POA: Insufficient documentation

## 2024-04-18 DIAGNOSIS — R2681 Unsteadiness on feet: Secondary | ICD-10-CM | POA: Insufficient documentation

## 2024-04-18 DIAGNOSIS — M6281 Muscle weakness (generalized): Secondary | ICD-10-CM | POA: Diagnosis present

## 2024-04-18 NOTE — Therapy (Signed)
 " OUTPATIENT PHYSICAL THERAPY LOWER EXTREMITY EVALUATION   Patient Name: Lee Nelson MRN: [id number] DOB:12-19-1940, 84 y.o., male Today's Date: 04/18/2024  END OF SESSION:  PT End of Session - 04/18/24 1406     Visit Number 1    Number of Visits 20    Date for Recertification  07/17/24    Authorization Type Medicare/AARP    Authorization - Visit Number 1    Progress Note Due on Visit 10    PT Start Time 1408    PT Stop Time 1506    PT Time Calculation (min) 58 min    Activity Tolerance Patient tolerated treatment well    Behavior During Therapy WFL for tasks assessed/performed          Past Medical History:  Diagnosis Date   Anxiety    Arthritis    Bladder calculus    BPH (benign prostatic hyperplasia)    Chronic constipation    Complication of anesthesia     I had some coughing afterwards for a couple of days--  per pt perfers spinal anesthesia since general anesthesia congnitive issues when older   Diverticulosis of colon    Dry eye syndrome of both eyes    Environmental allergies    GERD (gastroesophageal reflux disease)    occasional   History of adenomatous polyp of colon    08/ 2004   History of kidney stones    History of squamous cell carcinoma in situ (SCCIS) of skin    s/p  excision 2013 facial areas and 06/ 2016 nose   Migraine    eye migraine occasional   Seasonal and perennial allergic rhinitis    Thrombocytopenia    Tingling    hands and feet bilat , intermittantly-- per pt has lumbar bulging disk   Urinary frequency    Vocal fold atrophy    dysphonia-- per pt has to drink large amount of water  to take even on pill   Wears glasses    Past Surgical History:  Procedure Laterality Date   APPENDECTOMY  child   CARDIOVASCULAR STRESS TEST  05-17-2015  dr hilty   Low risk nuclear study w/ no ischemia/  normal LV function and wall motion , stress ef 54% (lvef 45-54%)   CHOLECYSTECTOMY N/A 09/29/2014   Procedure: LAPAROSCOPIC CHOLECYSTECTOMY  WITH INTRAOPERATIVE CHOLANGIOGRAM;  Surgeon: Alm Angle, MD;  Location: [facility];  Service: General;  Laterality: N/A;   COLONOSCOPY  last one 09-06-2010   ESOPHAGOGASTRODUODENOSCOPY N/A 12/09/2013   Procedure: ESOPHAGOGASTRODUODENOSCOPY (EGD);  Surgeon: Elsie Cree, MD;  Location: [facility];  Service: Endoscopy;  Laterality: N/A;   EXTRACORPOREAL SHOCK WAVE LITHOTRIPSY  yrs ago   INGUINAL HERNIA REPAIR Left child   inguinal hernia repair  09/2017   NISSEN FUNDOPLICATION  1980's   open   STONE EXTRACTION WITH BASKET N/A 08/18/2016   Procedure: STONE EXTRACTION WITH BASKET;  Surgeon: Chales Idol, MD;  Location: [facility];  Service: Urology;  Laterality: N/A;   THULIUM LASER TURP (TRANSURETHRAL RESECTION OF PROSTATE) N/A 08/18/2016   Procedure: THULIUM LASER BLADDER NECK INCISION AND BLADDER STONE REMOVAL;  Surgeon: Chales Idol, MD;  Location: [facility];  Service: Urology;  Laterality: N/A;   TONSILLECTOMY  child   TOTAL HIP ARTHROPLASTY Right 09/07/2022   Procedure: TOTAL HIP ARTHROPLASTY ANTERIOR APPROACH;  Surgeon: Vernetta Lonni GRADE, MD;  Location: [facility];  Service: Orthopedics;  Laterality: Right;   TRANSTHORACIC ECHOCARDIOGRAM  11/18/2010   grade 1 diastolic dysfunction,  ef 55-60%/  trivial MR and TR/ mild dilated RA   Patient Active Problem List   Diagnosis Date Noted   Status post total replacement of right hip 09/22/2022   Protein-calorie malnutrition, severe 09/08/2022   Hip fracture (HCC) 09/07/2022   BPH (benign prostatic hyperplasia) 09/07/2022   Closed fracture of right femur, unspecified fracture morphology, initial encounter (HCC) 09/07/2022   Fall 09/07/2022   Closed subcapital fracture of neck of femur, right, initial encounter (HCC) 09/07/2022   Memory impairment 08/06/2022   Pneumonia due to COVID-19 virus 05/06/2022   Severe sepsis (HCC) 05/06/2022   Acute hypoxemic respiratory failure (HCC) 05/06/2022   NPH  (normal pressure hydrocephalus) (HCC) 05/06/2022   Physical deconditioning 05/06/2022   Thrombocytopenia 05/06/2022   Dementia (HCC) 05/06/2022   SBO (small bowel obstruction) (HCC) 12/31/2019   Amaurosis fugax of right eye 08/25/2017   PSVT (paroxysmal supraventricular tachycardia) 09/12/2015   Chest pain 05/03/2015   Dyspnea 05/03/2015   ALLERGIC RHINITIS 09/21/2007   G E R D 09/21/2007   SMOKE INHALATION 09/21/2007   OSTEOPOROSIS 01/28/2007   PALPITATIONS 01/28/2007   COUGH 01/28/2007   ALLERGY 01/28/2007    PCP: Searcy Overcast, MD  REFERRING PROVIDER: Camie Sevin, PA-C  REFERRING DIAG:  R54.81 (ICD-10-CM) - Gait instability      THERAPY DIAG:  Unsteadiness on feet  Muscle weakness (generalized)  Other abnormalities of gait and mobility  Rationale for Evaluation and Treatment: Rehabilitation  ONSET DATE: 5-6 months  SUBJECTIVE:   SUBJECTIVE STATEMENT: Pt is a 84 year old male who presents with cognitive deficits and decline in function over the last couple years caregiver present who has been with the patient x5 months. Caregiver and pt report overall symptoms are unchanging the last few months with some days where function is maintained and some days when it is more difficult. Typical day includes intermittent exercise, meals, getting together with friends when able, reading, watching TV. Pt inquires about his daily routine schedule and encouraged caregivers to review central info board with pt daily. Pt reports upper quarter pain with laying down in his bed, improves with mobility and being out of bed. Recent changes to mattress pads/toppers and working towards finding comfortable level of support.   PERTINENT HISTORY: From referring provider visit 03/02/24:  Dementia with psychotic disturbance,  concern for Alzheimer's disease   Diagnosis of dementia confirmed by neuropsychological evaluation. Etiology unclear, with concerns for Alzheimer's disease and normal  pressure hydrocephalus (NPH) as contributing factors. CT scan showed hippocampal atrophy and moderate ventriculomegaly. Memory issues worsen with poor sleep and stress. No formal diagnosis of Alzheimer's disease yet, but high suspicion based on memory and behavior. - Ordered referral to interventional radiologist for evaluation of NPH and potential fluid removal. - Will consider lumbar puncture for Alzheimer's diagnosis if interventional radiologist deems fluid removal necessary. - Will discuss potential use of rivastigmine patch for memory support, considering swallowing issues. However her is on multiple medicines including ACHI of unknown source, he cannot recall how he got it. He is at risk of hypervitaminosis and side effects from excessive supplementation. - Referred to psychiatry for mood management , treatment of depression. PAIN:  Are you having pain? Yes: NPRS scale: mild, no number given  Pain location: thoracic spine, cervical spine Pain description: ache, sore Aggravating factors: laying down Relieving factors: movement  PRECAUTIONS: Fall and Other: cognitive decline  RED FLAGS: None   WEIGHT BEARING RESTRICTIONS: No  FALLS:  Has patient fallen in last 6 months? Pt  unsure, per chart review pt had noted fall in October/November  LIVING ENVIRONMENT: Lives with: lives with their spouse and 24/7 caregivers Lives in: House/apartment Stairs: No Has following equipment at home: Single point cane, Environmental Consultant - 4 wheeled, and Ramped entry  OCCUPATION: retired, professor  PLOF: Needs assistance with ADLs, Needs assistance with homemaking, Needs assistance with gait, and Needs assistance with transfers  PATIENT GOALS: improve strength and function  NEXT MD VISIT: 05/31/24- Neuro  OBJECTIVE:  Note: Objective measures were completed at Evaluation unless otherwise noted.  DIAGNOSTIC FINDINGS: Please see imaging section for details, MRI brain ordered per chart review  PATIENT  SURVEYS:   Tinetti: 10/28 (7 balance, 3 gait)  THE PATIENT SPECIFIC FUNCTIONAL SCALE  Place score of 0-10 (0 = unable to perform activity and 10 = able to perform activity at the same level as before injury or problem)  Activity Date: 04/18/24    Walking  7    2. Getting out of bed/chair (with help) 8    3. Strength in arms 7    4.      Total Score 7.3      Total Score = Sum of activity scores/number of activities  Minimally Detectable Change: 3 points (for single activity); 2 points (for average score)  [facility] (nd). The Patient Specific Functional Scale . Retrieved from [email or web address]   COGNITION: Overall cognitive status: Pt with cognitive deficits at baseline, requires frequent redirection to stay on task, has word finding difficulty, fixates throughout the session on logistical items at home, able to respond appropriately to questions and follows commands with inc time or with repeat instructions/visual cues      SENSATION: WFL  EDEMA:  No edema noted at time of eval   POSTURE: rounded shoulders, forward head, increased thoracic kyphosis, and flexed trunk   PALPATION: NA  LOWER EXTREMITY ROM: ROM in LE WFL bilaterally, weakness evident with AROM  Passive ROM Right eval Left eval  Hip flexion    Hip extension    Hip abduction    Hip adduction    Hip internal rotation    Hip external rotation    Knee flexion    Knee extension    Ankle dorsiflexion    Ankle plantarflexion    Ankle inversion    Ankle eversion     (Blank rows = not tested)  LOWER EXTREMITY MMT:  MMT Right eval Left eval  Hip flexion 3+/5 3+/5  Hip extension    Hip abduction 3/5 3/5  Hip adduction 4/5 4/5  Hip internal rotation    Hip external rotation    Knee flexion 4/5 4/5  Knee extension 3-/5 3-/5  Ankle dorsiflexion 3+/5 3+/5  Ankle plantarflexion 4-/5 4-/5  Ankle inversion    Ankle eversion     (Blank  rows = not tested)   FUNCTIONAL TESTS:  5 times sit to stand: 30.58 from transport chair with CGA and HHA   GAIT: Distance walked: 14ft Assistive device utilized: Environmental Consultant - 2 wheeled Level of assistance: Min A Comments: shortened stride bilaterally, double limb stance noted bilaterally with advancement of LE taking place in near modified tandem position with NBOS, feet not touching, shuffle pattern, knee flexion in stance bilaterally, flexed trunk, inc verbal cues required for directional changes and body position relative to walker  Sit to stand: CGA Mobility: min A with two wheeled walker today  TREATMENT DATE:   04/18/24- EVAL    PATIENT EDUCATION:  Education details: Pt educated on relevant anatomy, physiology, pathology, diagnosis, prognosis, progression of care, pain and activity modification related to functional decline and gait dysfunction. Person educated: Patient and Radiographer, Therapeutic Education method: Explanation, Demonstration, and Handouts Education comprehension: verbalized understanding, returned demonstration, and needs further education  HOME EXERCISE PROGRAM: Access Code: [email or web address] URL: [email or web address] Date: 04/18/2024 Prepared by: Stann Ohara  Exercises - Sit to Stand with Armchair  - 1 x daily - 4 x weekly - 1 sets - 5 reps - 3-5 hold - Seated Long Arc Quad  - 1 x daily - 7 x weekly - 3 sets - 10 reps - 2 hold - Seated March  - 1 x daily - 7 x weekly - 3 sets - 10-15 reps - 2 hold - Ankle Dorsiflexion with Resistance  - 1 x daily - 4 x weekly - 3 sets - 10 reps - 2 hold  ASSESSMENT:  CLINICAL IMPRESSION: Patient is a 84 y.o. M who was seen today for physical therapy evaluation and treatment for gait disturbances. Pt symptoms are consistent with weakness and coordination deficits which impede safety at home and in  the community with functional and foundational movements. Pt completed bout of PT in November 2025 with good outcomes, pt is a good candidate for this regimen of skilled PT. Pt has 24/7 support at home and will plan to work with caregivers during sessions to implement appropriate home program while addressing deficits in the clinic to improve and maintain function with ADLs and functional mobility. Pt stands to benefit from continued skilled physical therapy to address deficit areas and restore safety with activities and participations at home and in the community.    OBJECTIVE IMPAIRMENTS: Abnormal gait, decreased activity tolerance, decreased balance, decreased cognition, decreased coordination, decreased endurance, decreased mobility, difficulty walking, decreased strength, and postural dysfunction.   ACTIVITY LIMITATIONS: carrying, lifting, standing, squatting, stairs, transfers, bed mobility, dressing, and locomotion level  PARTICIPATION LIMITATIONS: meal prep, cleaning, laundry, medication management, shopping, and community activity  PERSONAL FACTORS: Age, Fitness, Past/current experiences, Time since onset of injury/illness/exacerbation, and 1-2 comorbidities: dementia are also affecting patient's functional outcome.   REHAB POTENTIAL: Good  CLINICAL DECISION MAKING: Stable/uncomplicated  EVALUATION COMPLEXITY: Low   GOALS: Goals reviewed with patient? Yes  SHORT TERM GOALS: Target date: 05/29/24   Pt will report compliance with HEP to work towards ind and home management strategies Baseline: Goal status: INITIAL   2.  Pt will score no less than 15/28 on tinetti gait and balance scale to demonstrate improved activity tolerance and safety Baseline: 10/28 (7 balance, 3 gait) Goal status: INITIAL   3.  Pt will improve 5 times Sit to Stand in 25 seconds or less to demonstrate improved endurance and strength  Baseline: 30.58 Goal status: INITIAL   4.  Pt will demonstrate no less  than 4/5 strength in all measured planes Baseline: see chart Goal status: INITIAL     LONG TERM GOALS: Target date: 07/17/24   Pt will score no less than 20/28 on tinetti gait and balance scale to demonstrate improved activity tolerance Baseline: 10/28 Goal status: INITIAL   2.  Pt will demonstrate sequence of supine lying to standing, progressing to walking 50 feet and complete stand to sit with LRAD and SBA or less Baseline:  Goal status: INITIAL   3.  Pt will be ind in the management of their symptoms and appropriate safety awareness at home  and in the community  Baseline:  Goal status: INITIAL   4.  Pt will complete 5 times Sit to Stand in 20 seconds or less with the use of BUE as needed to demonstrate improved endurance and strength with functional movements Baseline: 30s Goal status: INITIAL     PLAN:  PT FREQUENCY: 1-2x/week  PT DURATION: 12 weeks  PLANNED INTERVENTIONS: 97110-Therapeutic exercises, 97530- Therapeutic activity, 97112- Neuromuscular re-education, 97535- Self Care, [id number]- Manual therapy, [phone]- Gait training, [phone]- Electrical stimulation (unattended), [id number]- Vasopneumatic device, Patient/Family education, DME instructions, Wheelchair mobility training, Cryotherapy, and Moist heat  PLAN FOR NEXT SESSION: gait training (pt and caregiver will bring in pt 4WW), lower quarter strength, stability, endurance, balance, and motor control through functional movement patterns, provider discretion   Stann DELENA Ohara, PT 04/18/2024, 4:09 PM  "

## 2024-04-26 ENCOUNTER — Ambulatory Visit: Admitting: Physical Therapy

## 2024-04-29 ENCOUNTER — Ambulatory Visit: Admitting: Physical Therapy

## 2024-05-03 ENCOUNTER — Ambulatory Visit

## 2024-05-03 ENCOUNTER — Telehealth: Payer: Self-pay

## 2024-05-03 NOTE — Telephone Encounter (Signed)
 LVM regarding missed appointment. Confirmed next appointment time.  1st no-show  Corean Pouch, [place] 05/03/24 12:33 PM

## 2024-05-05 ENCOUNTER — Ambulatory Visit

## 2024-05-09 ENCOUNTER — Ambulatory Visit

## 2024-05-12 ENCOUNTER — Ambulatory Visit

## 2024-05-16 ENCOUNTER — Ambulatory Visit

## 2024-05-19 ENCOUNTER — Ambulatory Visit

## 2024-05-26 ENCOUNTER — Ambulatory Visit

## 2024-05-31 ENCOUNTER — Ambulatory Visit: Admitting: Physician Assistant
